# Patient Record
Sex: Female | Born: 1940
Health system: Southern US, Community
[De-identification: ages and names within clinical notes are randomized; demographics above are authoritative.]

## PROBLEM LIST (undated history)

## (undated) DIAGNOSIS — E785 Hyperlipidemia, unspecified: Secondary | ICD-10-CM

## (undated) DIAGNOSIS — R42 Dizziness and giddiness: Secondary | ICD-10-CM

## (undated) DIAGNOSIS — R55 Syncope and collapse: Secondary | ICD-10-CM

## (undated) DIAGNOSIS — R7303 Prediabetes: Secondary | ICD-10-CM

## (undated) DIAGNOSIS — H409 Unspecified glaucoma: Secondary | ICD-10-CM

## (undated) DIAGNOSIS — E7800 Pure hypercholesterolemia, unspecified: Secondary | ICD-10-CM

## (undated) DIAGNOSIS — K219 Gastro-esophageal reflux disease without esophagitis: Secondary | ICD-10-CM

## (undated) DIAGNOSIS — M5136 Other intervertebral disc degeneration, lumbar region: Secondary | ICD-10-CM

## (undated) DIAGNOSIS — I10 Essential (primary) hypertension: Secondary | ICD-10-CM

## (undated) DIAGNOSIS — M1990 Unspecified osteoarthritis, unspecified site: Secondary | ICD-10-CM

## (undated) DIAGNOSIS — M797 Fibromyalgia: Secondary | ICD-10-CM

## (undated) DIAGNOSIS — F329 Major depressive disorder, single episode, unspecified: Secondary | ICD-10-CM

## (undated) DIAGNOSIS — I714 Abdominal aortic aneurysm, without rupture, unspecified: Secondary | ICD-10-CM

## (undated) DIAGNOSIS — W19XXXA Unspecified fall, initial encounter: Secondary | ICD-10-CM

## (undated) DIAGNOSIS — F32A Depression, unspecified: Secondary | ICD-10-CM

## (undated) DIAGNOSIS — M51369 Other intervertebral disc degeneration, lumbar region without mention of lumbar back pain or lower extremity pain: Secondary | ICD-10-CM

## (undated) DIAGNOSIS — M5126 Other intervertebral disc displacement, lumbar region: Secondary | ICD-10-CM

## (undated) HISTORY — DX: Dizziness and giddiness: R42

## (undated) HISTORY — DX: Unspecified fall, initial encounter: W19.XXXA

## (undated) HISTORY — PX: VAGINAL HYSTERECTOMY: SUR661

## (undated) HISTORY — DX: Hyperlipidemia, unspecified: E78.5

## (undated) HISTORY — PX: CARDIAC CATHETERIZATION: SHX172

## (undated) SURGERY — Surgical Case
Anesthesia: *Unknown

---

## 1964-04-11 HISTORY — PX: ABDOMINAL AORTIC ANEURYSM REPAIR: SUR1152

## 1964-04-11 HISTORY — PX: APPENDECTOMY: SHX54

## 2011-05-02 DIAGNOSIS — H4011X0 Primary open-angle glaucoma, stage unspecified: Secondary | ICD-10-CM | POA: Diagnosis not present

## 2011-06-01 DIAGNOSIS — Z6829 Body mass index (BMI) 29.0-29.9, adult: Secondary | ICD-10-CM | POA: Diagnosis not present

## 2011-06-01 DIAGNOSIS — I10 Essential (primary) hypertension: Secondary | ICD-10-CM | POA: Diagnosis not present

## 2011-06-01 DIAGNOSIS — E785 Hyperlipidemia, unspecified: Secondary | ICD-10-CM | POA: Diagnosis not present

## 2011-06-01 DIAGNOSIS — M1990 Unspecified osteoarthritis, unspecified site: Secondary | ICD-10-CM | POA: Diagnosis not present

## 2011-08-17 DIAGNOSIS — M25519 Pain in unspecified shoulder: Secondary | ICD-10-CM | POA: Diagnosis not present

## 2011-08-17 DIAGNOSIS — Z6830 Body mass index (BMI) 30.0-30.9, adult: Secondary | ICD-10-CM | POA: Diagnosis not present

## 2011-08-24 DIAGNOSIS — Z6829 Body mass index (BMI) 29.0-29.9, adult: Secondary | ICD-10-CM | POA: Diagnosis not present

## 2011-08-24 DIAGNOSIS — M25519 Pain in unspecified shoulder: Secondary | ICD-10-CM | POA: Diagnosis not present

## 2011-09-19 DIAGNOSIS — Z800 Family history of malignant neoplasm of digestive organs: Secondary | ICD-10-CM | POA: Diagnosis not present

## 2011-09-19 DIAGNOSIS — Z86010 Personal history of colonic polyps: Secondary | ICD-10-CM | POA: Diagnosis not present

## 2011-10-18 DIAGNOSIS — H409 Unspecified glaucoma: Secondary | ICD-10-CM | POA: Diagnosis not present

## 2011-10-18 DIAGNOSIS — H4011X0 Primary open-angle glaucoma, stage unspecified: Secondary | ICD-10-CM | POA: Diagnosis not present

## 2012-02-02 DIAGNOSIS — M1990 Unspecified osteoarthritis, unspecified site: Secondary | ICD-10-CM | POA: Diagnosis not present

## 2012-02-02 DIAGNOSIS — Z6829 Body mass index (BMI) 29.0-29.9, adult: Secondary | ICD-10-CM | POA: Diagnosis not present

## 2012-02-02 DIAGNOSIS — I10 Essential (primary) hypertension: Secondary | ICD-10-CM | POA: Diagnosis not present

## 2012-02-02 DIAGNOSIS — E785 Hyperlipidemia, unspecified: Secondary | ICD-10-CM | POA: Diagnosis not present

## 2012-02-06 DIAGNOSIS — L578 Other skin changes due to chronic exposure to nonionizing radiation: Secondary | ICD-10-CM | POA: Diagnosis not present

## 2012-02-06 DIAGNOSIS — L821 Other seborrheic keratosis: Secondary | ICD-10-CM | POA: Diagnosis not present

## 2012-02-06 DIAGNOSIS — L570 Actinic keratosis: Secondary | ICD-10-CM | POA: Diagnosis not present

## 2012-02-13 DIAGNOSIS — Z1231 Encounter for screening mammogram for malignant neoplasm of breast: Secondary | ICD-10-CM | POA: Diagnosis not present

## 2012-04-06 DIAGNOSIS — H4011X0 Primary open-angle glaucoma, stage unspecified: Secondary | ICD-10-CM | POA: Diagnosis not present

## 2012-04-06 DIAGNOSIS — H35359 Cystoid macular degeneration, unspecified eye: Secondary | ICD-10-CM | POA: Diagnosis not present

## 2012-04-06 DIAGNOSIS — H409 Unspecified glaucoma: Secondary | ICD-10-CM | POA: Diagnosis not present

## 2012-04-06 DIAGNOSIS — H348192 Central retinal vein occlusion, unspecified eye, stable: Secondary | ICD-10-CM | POA: Diagnosis not present

## 2012-04-19 DIAGNOSIS — H4010X0 Unspecified open-angle glaucoma, stage unspecified: Secondary | ICD-10-CM | POA: Diagnosis not present

## 2012-04-19 DIAGNOSIS — H348192 Central retinal vein occlusion, unspecified eye, stable: Secondary | ICD-10-CM | POA: Diagnosis not present

## 2012-07-05 DIAGNOSIS — K219 Gastro-esophageal reflux disease without esophagitis: Secondary | ICD-10-CM | POA: Diagnosis not present

## 2012-07-05 DIAGNOSIS — M1990 Unspecified osteoarthritis, unspecified site: Secondary | ICD-10-CM | POA: Diagnosis not present

## 2012-07-05 DIAGNOSIS — I10 Essential (primary) hypertension: Secondary | ICD-10-CM | POA: Diagnosis not present

## 2012-07-05 DIAGNOSIS — E785 Hyperlipidemia, unspecified: Secondary | ICD-10-CM | POA: Diagnosis not present

## 2012-08-29 DIAGNOSIS — H348192 Central retinal vein occlusion, unspecified eye, stable: Secondary | ICD-10-CM | POA: Diagnosis not present

## 2012-08-29 DIAGNOSIS — H4011X0 Primary open-angle glaucoma, stage unspecified: Secondary | ICD-10-CM | POA: Diagnosis not present

## 2012-08-29 DIAGNOSIS — H259 Unspecified age-related cataract: Secondary | ICD-10-CM | POA: Diagnosis not present

## 2012-08-29 DIAGNOSIS — H409 Unspecified glaucoma: Secondary | ICD-10-CM | POA: Diagnosis not present

## 2012-10-30 DIAGNOSIS — H409 Unspecified glaucoma: Secondary | ICD-10-CM | POA: Diagnosis not present

## 2012-10-30 DIAGNOSIS — H4011X0 Primary open-angle glaucoma, stage unspecified: Secondary | ICD-10-CM | POA: Diagnosis not present

## 2013-01-07 DIAGNOSIS — E785 Hyperlipidemia, unspecified: Secondary | ICD-10-CM | POA: Diagnosis not present

## 2013-01-07 DIAGNOSIS — I10 Essential (primary) hypertension: Secondary | ICD-10-CM | POA: Diagnosis not present

## 2013-01-07 DIAGNOSIS — M1990 Unspecified osteoarthritis, unspecified site: Secondary | ICD-10-CM | POA: Diagnosis not present

## 2013-01-07 DIAGNOSIS — Z6829 Body mass index (BMI) 29.0-29.9, adult: Secondary | ICD-10-CM | POA: Diagnosis not present

## 2013-01-07 DIAGNOSIS — Z9181 History of falling: Secondary | ICD-10-CM | POA: Diagnosis not present

## 2013-01-07 DIAGNOSIS — Z1331 Encounter for screening for depression: Secondary | ICD-10-CM | POA: Diagnosis not present

## 2013-05-09 DIAGNOSIS — H409 Unspecified glaucoma: Secondary | ICD-10-CM | POA: Diagnosis not present

## 2013-05-09 DIAGNOSIS — H4011X0 Primary open-angle glaucoma, stage unspecified: Secondary | ICD-10-CM | POA: Diagnosis not present

## 2013-05-09 DIAGNOSIS — H47239 Glaucomatous optic atrophy, unspecified eye: Secondary | ICD-10-CM | POA: Diagnosis not present

## 2013-05-14 DIAGNOSIS — L821 Other seborrheic keratosis: Secondary | ICD-10-CM | POA: Diagnosis not present

## 2013-05-14 DIAGNOSIS — L570 Actinic keratosis: Secondary | ICD-10-CM | POA: Diagnosis not present

## 2013-05-14 DIAGNOSIS — L578 Other skin changes due to chronic exposure to nonionizing radiation: Secondary | ICD-10-CM | POA: Diagnosis not present

## 2013-06-16 DIAGNOSIS — I10 Essential (primary) hypertension: Secondary | ICD-10-CM | POA: Diagnosis not present

## 2013-06-16 DIAGNOSIS — K5289 Other specified noninfective gastroenteritis and colitis: Secondary | ICD-10-CM | POA: Diagnosis not present

## 2013-06-17 DIAGNOSIS — R5383 Other fatigue: Secondary | ICD-10-CM | POA: Diagnosis not present

## 2013-06-17 DIAGNOSIS — E860 Dehydration: Secondary | ICD-10-CM | POA: Diagnosis not present

## 2013-06-17 DIAGNOSIS — R5381 Other malaise: Secondary | ICD-10-CM | POA: Diagnosis not present

## 2013-06-17 DIAGNOSIS — R109 Unspecified abdominal pain: Secondary | ICD-10-CM | POA: Diagnosis not present

## 2013-06-17 DIAGNOSIS — R1110 Vomiting, unspecified: Secondary | ICD-10-CM | POA: Diagnosis not present

## 2013-06-17 DIAGNOSIS — R1084 Generalized abdominal pain: Secondary | ICD-10-CM | POA: Diagnosis not present

## 2013-06-17 DIAGNOSIS — R112 Nausea with vomiting, unspecified: Secondary | ICD-10-CM | POA: Diagnosis not present

## 2013-06-19 DIAGNOSIS — Z79899 Other long term (current) drug therapy: Secondary | ICD-10-CM | POA: Diagnosis not present

## 2013-06-19 DIAGNOSIS — R112 Nausea with vomiting, unspecified: Secondary | ICD-10-CM | POA: Diagnosis not present

## 2013-06-19 DIAGNOSIS — R1909 Other intra-abdominal and pelvic swelling, mass and lump: Secondary | ICD-10-CM | POA: Diagnosis not present

## 2013-06-19 DIAGNOSIS — E876 Hypokalemia: Secondary | ICD-10-CM | POA: Diagnosis not present

## 2013-06-19 DIAGNOSIS — K929 Disease of digestive system, unspecified: Secondary | ICD-10-CM | POA: Diagnosis not present

## 2013-06-19 DIAGNOSIS — R109 Unspecified abdominal pain: Secondary | ICD-10-CM | POA: Diagnosis not present

## 2013-06-19 DIAGNOSIS — F411 Generalized anxiety disorder: Secondary | ICD-10-CM | POA: Diagnosis not present

## 2013-06-19 DIAGNOSIS — J984 Other disorders of lung: Secondary | ICD-10-CM | POA: Diagnosis not present

## 2013-06-19 DIAGNOSIS — E7800 Pure hypercholesterolemia, unspecified: Secondary | ICD-10-CM | POA: Diagnosis present

## 2013-06-19 DIAGNOSIS — K828 Other specified diseases of gallbladder: Secondary | ICD-10-CM | POA: Diagnosis not present

## 2013-06-19 DIAGNOSIS — R1084 Generalized abdominal pain: Secondary | ICD-10-CM | POA: Diagnosis not present

## 2013-06-19 DIAGNOSIS — K560 Paralytic ileus: Secondary | ICD-10-CM | POA: Diagnosis not present

## 2013-06-19 DIAGNOSIS — D72825 Bandemia: Secondary | ICD-10-CM | POA: Diagnosis not present

## 2013-06-19 DIAGNOSIS — I10 Essential (primary) hypertension: Secondary | ICD-10-CM | POA: Diagnosis not present

## 2013-06-19 DIAGNOSIS — D484 Neoplasm of uncertain behavior of peritoneum: Secondary | ICD-10-CM | POA: Diagnosis not present

## 2013-06-19 DIAGNOSIS — Q438 Other specified congenital malformations of intestine: Secondary | ICD-10-CM | POA: Diagnosis not present

## 2013-06-19 DIAGNOSIS — D483 Neoplasm of uncertain behavior of retroperitoneum: Secondary | ICD-10-CM | POA: Diagnosis not present

## 2013-06-19 DIAGNOSIS — K654 Sclerosing mesenteritis: Secondary | ICD-10-CM | POA: Diagnosis not present

## 2013-06-19 DIAGNOSIS — M1990 Unspecified osteoarthritis, unspecified site: Secondary | ICD-10-CM | POA: Diagnosis present

## 2013-06-19 DIAGNOSIS — K5289 Other specified noninfective gastroenteritis and colitis: Secondary | ICD-10-CM | POA: Diagnosis not present

## 2013-07-02 DIAGNOSIS — R109 Unspecified abdominal pain: Secondary | ICD-10-CM | POA: Diagnosis not present

## 2013-07-02 DIAGNOSIS — K5289 Other specified noninfective gastroenteritis and colitis: Secondary | ICD-10-CM | POA: Diagnosis not present

## 2013-07-02 DIAGNOSIS — Z6828 Body mass index (BMI) 28.0-28.9, adult: Secondary | ICD-10-CM | POA: Diagnosis not present

## 2013-07-15 DIAGNOSIS — H4011X0 Primary open-angle glaucoma, stage unspecified: Secondary | ICD-10-CM | POA: Diagnosis not present

## 2013-07-15 DIAGNOSIS — H409 Unspecified glaucoma: Secondary | ICD-10-CM | POA: Diagnosis not present

## 2013-07-16 DIAGNOSIS — Z6826 Body mass index (BMI) 26.0-26.9, adult: Secondary | ICD-10-CM | POA: Diagnosis not present

## 2013-07-16 DIAGNOSIS — T8140XA Infection following a procedure, unspecified, initial encounter: Secondary | ICD-10-CM | POA: Diagnosis not present

## 2013-08-26 ENCOUNTER — Emergency Department (HOSPITAL_COMMUNITY)
Admission: EM | Admit: 2013-08-26 | Discharge: 2013-08-26 | Disposition: A | Discharge status: Home / Self Care | Payer: Medicare Other | Attending: Emergency Medicine | Admitting: Emergency Medicine

## 2013-08-26 ENCOUNTER — Encounter (HOSPITAL_COMMUNITY): Payer: Self-pay | Admitting: Emergency Medicine

## 2013-08-26 ENCOUNTER — Emergency Department (HOSPITAL_COMMUNITY): Payer: Medicare Other

## 2013-08-26 DIAGNOSIS — R109 Unspecified abdominal pain: Secondary | ICD-10-CM | POA: Diagnosis not present

## 2013-08-26 DIAGNOSIS — I10 Essential (primary) hypertension: Secondary | ICD-10-CM | POA: Insufficient documentation

## 2013-08-26 DIAGNOSIS — H409 Unspecified glaucoma: Secondary | ICD-10-CM | POA: Diagnosis not present

## 2013-08-26 DIAGNOSIS — E119 Type 2 diabetes mellitus without complications: Secondary | ICD-10-CM | POA: Insufficient documentation

## 2013-08-26 DIAGNOSIS — Z4801 Encounter for change or removal of surgical wound dressing: Secondary | ICD-10-CM | POA: Insufficient documentation

## 2013-08-26 DIAGNOSIS — Z4889 Encounter for other specified surgical aftercare: Secondary | ICD-10-CM

## 2013-08-26 DIAGNOSIS — Z4802 Encounter for removal of sutures: Secondary | ICD-10-CM | POA: Diagnosis not present

## 2013-08-26 HISTORY — DX: Essential (primary) hypertension: I10

## 2013-08-26 HISTORY — DX: Abdominal aortic aneurysm, without rupture, unspecified: I71.40

## 2013-08-26 HISTORY — DX: Abdominal aortic aneurysm, without rupture: I71.4

## 2013-08-26 LAB — CBC WITH DIFFERENTIAL/PLATELET
BASOS ABS: 0 10*3/uL (ref 0.0–0.1)
Basophils Relative: 1 % (ref 0–1)
EOS ABS: 0.1 10*3/uL (ref 0.0–0.7)
Eosinophils Relative: 2 % (ref 0–5)
HCT: 41.5 % (ref 36.0–46.0)
Hemoglobin: 13.9 g/dL (ref 12.0–15.0)
Lymphocytes Relative: 38 % (ref 12–46)
Lymphs Abs: 1.2 10*3/uL (ref 0.7–4.0)
MCH: 33.7 pg (ref 26.0–34.0)
MCHC: 33.5 g/dL (ref 30.0–36.0)
MCV: 100.5 fL — ABNORMAL HIGH (ref 78.0–100.0)
Monocytes Absolute: 0.4 10*3/uL (ref 0.1–1.0)
Monocytes Relative: 13 % — ABNORMAL HIGH (ref 3–12)
NEUTROS PCT: 46 % (ref 43–77)
Neutro Abs: 1.5 10*3/uL — ABNORMAL LOW (ref 1.7–7.7)
PLATELETS: 179 10*3/uL (ref 150–400)
RBC: 4.13 MIL/uL (ref 3.87–5.11)
RDW: 13.9 % (ref 11.5–15.5)
WBC: 3.1 10*3/uL — ABNORMAL LOW (ref 4.0–10.5)

## 2013-08-26 LAB — BASIC METABOLIC PANEL
BUN: 20 mg/dL (ref 6–23)
CO2: 24 mEq/L (ref 19–32)
Calcium: 8.8 mg/dL (ref 8.4–10.5)
Chloride: 107 mEq/L (ref 96–112)
Creatinine, Ser: 0.98 mg/dL (ref 0.50–1.10)
GFR, EST AFRICAN AMERICAN: 65 mL/min — AB (ref >90)
GFR, EST NON AFRICAN AMERICAN: 56 mL/min — AB (ref >90)
Glucose, Bld: 90 mg/dL (ref 70–99)
Potassium: 3.9 mEq/L (ref 3.7–5.3)
SODIUM: 143 meq/L (ref 137–147)

## 2013-08-26 MED ORDER — SODIUM CHLORIDE 0.9 % IV BOLUS (SEPSIS)
1000.0000 mL | Freq: Once | INTRAVENOUS | Status: AC
  Administered 2013-08-26: 1000 mL via INTRAVENOUS

## 2013-08-26 MED ORDER — IOHEXOL 300 MG/ML  SOLN
100.0000 mL | Freq: Once | INTRAMUSCULAR | Status: AC | PRN
  Administered 2013-08-26: 100 mL via INTRAVENOUS

## 2013-08-26 MED ORDER — IOHEXOL 300 MG/ML  SOLN
25.0000 mL | INTRAMUSCULAR | Status: AC
  Administered 2013-08-26: 25 mL via ORAL

## 2013-08-26 NOTE — ED Notes (Signed)
Pt finished drinking oral CT contrast. CT has been notified.

## 2013-08-26 NOTE — ED Notes (Addendum)
Family reports that pt had exploratory surgery in March and has had complications since. Family reports that the incision keeps draining, Pt has been seen by surgeon but not satisfied. Surgery was done at [facility].

## 2013-08-26 NOTE — Discharge Instructions (Signed)
Your incision looks like it is healing well. It may continue slowly draining clearish yellow fluid. Your CT abdomen showed no sign of wound infection. If you develop fevers, pus discharge from your wound, redness, wound opening, or other concerns, seek immediate care. Otherwise, follow up regularly with your surgeon.  Your CT did show a 7 mm RIGHT middle lobe nodule, and radiologist recommendation follow up chest CT in 3-6 months if you are at high risk for bronchogenic carcinoma and 6-12 months if you are at low risk. Discuss this with your primary care doctor.

## 2013-08-26 NOTE — ED Provider Notes (Signed)
CSN: [id number]     Arrival date & time 08/26/13  0272 History   First MD Initiated Contact with Patient 08/26/13 [phone]     Chief Complaint  Patient presents with  . Post-op Problem  . Wound Check   Patient is a 73 y.o. female presenting with wound check.  Wound Check   73 y.o. female with concern for wound problem. She reports 2 months ago having exploratory laparotomy at [facility] and feeling very concerned that the wound did not have adequate closure. Wound was stapled shut and husband shows me photos of area of nonclosure. Patient states this area was draining and has been tender for the past 2 months. She has had subjective fevers/chills and abdominal pain since surgery, and they are very concerned about remaining staple. She has been able to eat and drink and is passing stools normally.  Past Medical History  Diagnosis Date  . Hypertension   . Diabetes mellitus without complication boderline  . Glaucoma   . AAA (abdominal aortic aneurysm)    Past Surgical History  Procedure Laterality Date  . Abdominal surgery    . Abdominal hysterectomy     No family history on file. History  Substance Use Topics  . Smoking status: Never Smoker   . Smokeless tobacco: Not on file  . Alcohol Use: No   OB History   Grav Para Term Preterm Abortions TAB SAB Ect Mult Living                 Review of Systems  All other systems reviewed and are negative.   Allergies  Review of patient's allergies indicates no known allergies.  Home Medications   Prior to Admission medications   Not on File   BP 126/62  Pulse 69  Temp(Src) 98.1 F (36.7 C) (Oral)  Resp 18  Ht 5' 1.5" (1.562 m)  Wt 148 lb (67.132 kg)  BMI 27.51 kg/m2  SpO2 96% Physical Exam GEN: NAD CV: RRR, 2+ bilateral radial pulses PULM: CTAB, normal effort ABD: Vertical 4cm incision that is healed with central thickening/granulation tissue at 3cm of wound, no surrounding induration or erythema, dressing with  minimal dry serous drainage, mild tenderness lower abdomen and suprapubic, no rebound or guarding EXTR: No LE edema or calf tenderness HEENT: AT/[place], EOMI, sclera clear  ED Course  Procedures (including critical care time) Labs Review Labs Reviewed  CBC WITH DIFFERENTIAL - Abnormal; Notable for the following:    WBC 3.1 (*)    MCV 100.5 (*)    Neutro Abs 1.5 (*)    Monocytes Relative 13 (*)    All other components within normal limits  BASIC METABOLIC PANEL - Abnormal; Notable for the following:    GFR calc non Af Amer 56 (*)    GFR calc Af Amer 65 (*)    All other components within normal limits   Imaging Review Ct Abdomen Pelvis W Contrast  08/26/2013   CLINICAL DATA:  Abdominal pain at incision site from exploratory laparotomy performed approximately 2 months ago, history hypertension, diabetes, hysterectomy  EXAM: CT ABDOMEN AND PELVIS WITH CONTRAST  TECHNIQUE: Multidetector CT imaging of the abdomen and pelvis was performed using the standard protocol following bolus administration of intravenous contrast. Sagittal and coronal MPR images reconstructed from axial data set.  CONTRAST:  142mL OMNIPAQUE IOHEXOL 300 MG/ML SOLN IV. Dilute oral contrast.  COMPARISON:  06/19/2013  FINDINGS: Dependent atelectasis at lung bases.  RIGHT middle lobe nodular density 7 mm diameter  image 5, not previously imaged.  Tiny splenule anterior to spleen.  Liver, spleen, pancreas, kidneys, and adrenal glands normal.  Stomach decompressed.  Appendix not visualized.  Large and small bowel loops unremarkable.  No mass, adenopathy, free fluid or inflammatory process.  Unremarkable bladder and ureters.  Infiltrative changes at anterior abdominal wall post ventral laparotomy new since previous exam.  Multiple areas of stranding are seen within subcutaneous fat at the incision as well as intermixed within fascial planes at the midline.  No discrete definitive fashion defect is seen to suggest ventral hernia.  No focal  abdominal wall fluid collections or bowel herniation identified.  Minimal scattered atherosclerotic calcification.  Degenerative disc disease changes at L4-L5 and L5-S1.  IMPRESSION: Postsurgical changes at the anterior abdominal wall without definite ventral hernia or postoperative fluid collection.  No acute intra-abdominal or intrapelvic abnormalities.  7 mm RIGHT middle lobe nodule, recommendation below.  If the patient is at high risk for bronchogenic carcinoma, follow-up chest CT at 3-74months is recommended. If the patient is at low risk for bronchogenic carcinoma, follow-up chest CT at 6-12 months is recommended. This recommendation follows the consensus statement: Guidelines for Management of Small Pulmonary Nodules Detected on CT Scans: A Statement from the [place] as published in Radiology 2005; 237:395-400.   Electronically Signed   By: Lavonia Dana M.D.   On: 08/26/2013 15:04     EKG Interpretation None      MDM   Final diagnoses:  Encounter for postoperative wound check   73 y.o. female with exploratory laparotomy 2 mo ago at outside hospital, reporting concern about poor healing / staple left inside with continued pain/tenderness, subjective fevers/chills, and wound drainage that has been decreasing but is still present. CT abdomen with no signs of infection/abscess or fluid collection. Patient is afebrile with normal vitals. No leukocytosis. - Reassurance provided. Wound appears to be healing well. - Return precautions reviewed.  - F/u with surgeon early June as scheduled. - Incidental CT scan (67mm right middle lobe nodule) discussed with patient and recommended f/u as suggested by radiologist.  Hilton Sinclair, MD PGY-2, [facility]   Hilton Sinclair, MD 08/26/13 [phone]

## 2013-08-27 NOTE — ED Provider Notes (Signed)
I saw and evaluated the patient, reviewed the resident's note and I agree with the findings and plan. Patient is a 73 year old female who presents with complaints of abdominal pain and wound drainage. She was recently admitted to [facility] for some sort of "bowel inflammation". This required exploratory laparotomy which was performed and was nondiagnostic. She states since that time she has had wound drainage and continued abdominal discomfort. She denies any fevers or chills. She denies any bloody stool or vomiting.  On exam, vitals are stable and the patient is afebrile. Head is atraumatic, normocephalic. Neck is supple. Heart regular rate and rhythm and lungs are clear. Abdominal exam reveals a midline incision to be healing well without evidence for drainage, erythema, or warmth. There is no palpable fluctuance beneath the wound. There is mild tenderness to palpation in the left lower and right lower quadrant with no rebound or guarding. Extremities are without edema.  Patient presents with concerns of possible complication related to surgery performed at an outside facility. Her laboratory studies are unremarkable. CT scan of the abdomen and pelvis was obtained which revealed post surgical changes in the anterior abdominal wall, however no acute process. The patient was reassured and appears appropriate for discharge. We have advised they followup with their surgeon to discuss further concerns.      Veryl Speak, MD 08/27/13 [phone]

## 2013-10-18 DIAGNOSIS — I10 Essential (primary) hypertension: Secondary | ICD-10-CM | POA: Diagnosis not present

## 2013-10-18 DIAGNOSIS — Z0000 Encounter for general adult medical examination without abnormal findings: Secondary | ICD-10-CM | POA: Diagnosis not present

## 2013-10-18 DIAGNOSIS — E785 Hyperlipidemia, unspecified: Secondary | ICD-10-CM | POA: Diagnosis not present

## 2013-10-18 DIAGNOSIS — Z6828 Body mass index (BMI) 28.0-28.9, adult: Secondary | ICD-10-CM | POA: Diagnosis not present

## 2013-10-18 DIAGNOSIS — Z23 Encounter for immunization: Secondary | ICD-10-CM | POA: Diagnosis not present

## 2013-10-18 DIAGNOSIS — M949 Disorder of cartilage, unspecified: Secondary | ICD-10-CM | POA: Diagnosis not present

## 2013-10-18 DIAGNOSIS — M899 Disorder of bone, unspecified: Secondary | ICD-10-CM | POA: Diagnosis not present

## 2013-11-15 DIAGNOSIS — Z1231 Encounter for screening mammogram for malignant neoplasm of breast: Secondary | ICD-10-CM | POA: Diagnosis not present

## 2013-11-15 DIAGNOSIS — M899 Disorder of bone, unspecified: Secondary | ICD-10-CM | POA: Diagnosis not present

## 2013-11-15 DIAGNOSIS — M949 Disorder of cartilage, unspecified: Secondary | ICD-10-CM | POA: Diagnosis not present

## 2014-01-01 DIAGNOSIS — H409 Unspecified glaucoma: Secondary | ICD-10-CM | POA: Diagnosis not present

## 2014-01-01 DIAGNOSIS — H269 Unspecified cataract: Secondary | ICD-10-CM | POA: Insufficient documentation

## 2014-01-01 DIAGNOSIS — H2510 Age-related nuclear cataract, unspecified eye: Secondary | ICD-10-CM | POA: Diagnosis not present

## 2014-01-01 DIAGNOSIS — IMO0002 Reserved for concepts with insufficient information to code with codable children: Secondary | ICD-10-CM

## 2014-01-01 DIAGNOSIS — H4011X0 Primary open-angle glaucoma, stage unspecified: Secondary | ICD-10-CM | POA: Diagnosis not present

## 2014-01-01 HISTORY — DX: Reserved for concepts with insufficient information to code with codable children: IMO0002

## 2014-01-20 DIAGNOSIS — Z6829 Body mass index (BMI) 29.0-29.9, adult: Secondary | ICD-10-CM | POA: Diagnosis not present

## 2014-01-20 DIAGNOSIS — I10 Essential (primary) hypertension: Secondary | ICD-10-CM | POA: Diagnosis not present

## 2014-01-20 DIAGNOSIS — M1990 Unspecified osteoarthritis, unspecified site: Secondary | ICD-10-CM | POA: Diagnosis not present

## 2014-01-20 DIAGNOSIS — E785 Hyperlipidemia, unspecified: Secondary | ICD-10-CM | POA: Diagnosis not present

## 2014-01-20 DIAGNOSIS — M8580 Other specified disorders of bone density and structure, unspecified site: Secondary | ICD-10-CM | POA: Diagnosis not present

## 2014-01-20 DIAGNOSIS — K219 Gastro-esophageal reflux disease without esophagitis: Secondary | ICD-10-CM | POA: Diagnosis not present

## 2014-01-20 DIAGNOSIS — F329 Major depressive disorder, single episode, unspecified: Secondary | ICD-10-CM | POA: Diagnosis not present

## 2014-04-24 DIAGNOSIS — F329 Major depressive disorder, single episode, unspecified: Secondary | ICD-10-CM | POA: Diagnosis not present

## 2014-04-24 DIAGNOSIS — Z6830 Body mass index (BMI) 30.0-30.9, adult: Secondary | ICD-10-CM | POA: Diagnosis not present

## 2014-04-24 DIAGNOSIS — K219 Gastro-esophageal reflux disease without esophagitis: Secondary | ICD-10-CM | POA: Diagnosis not present

## 2014-04-24 DIAGNOSIS — E785 Hyperlipidemia, unspecified: Secondary | ICD-10-CM | POA: Diagnosis not present

## 2014-04-24 DIAGNOSIS — I10 Essential (primary) hypertension: Secondary | ICD-10-CM | POA: Diagnosis not present

## 2014-04-24 DIAGNOSIS — M1990 Unspecified osteoarthritis, unspecified site: Secondary | ICD-10-CM | POA: Diagnosis not present

## 2014-04-24 DIAGNOSIS — M8580 Other specified disorders of bone density and structure, unspecified site: Secondary | ICD-10-CM | POA: Diagnosis not present

## 2014-05-08 DIAGNOSIS — H4011X2 Primary open-angle glaucoma, moderate stage: Secondary | ICD-10-CM | POA: Diagnosis not present

## 2014-05-14 DIAGNOSIS — L739 Follicular disorder, unspecified: Secondary | ICD-10-CM | POA: Diagnosis not present

## 2014-05-14 DIAGNOSIS — L570 Actinic keratosis: Secondary | ICD-10-CM | POA: Diagnosis not present

## 2014-05-14 DIAGNOSIS — L821 Other seborrheic keratosis: Secondary | ICD-10-CM | POA: Diagnosis not present

## 2014-06-16 DIAGNOSIS — H2513 Age-related nuclear cataract, bilateral: Secondary | ICD-10-CM | POA: Diagnosis not present

## 2014-06-16 DIAGNOSIS — H4011X2 Primary open-angle glaucoma, moderate stage: Secondary | ICD-10-CM | POA: Diagnosis not present

## 2014-06-16 DIAGNOSIS — H4011X1 Primary open-angle glaucoma, mild stage: Secondary | ICD-10-CM | POA: Diagnosis not present

## 2014-11-04 DIAGNOSIS — R05 Cough: Secondary | ICD-10-CM | POA: Diagnosis not present

## 2014-11-04 DIAGNOSIS — M1990 Unspecified osteoarthritis, unspecified site: Secondary | ICD-10-CM | POA: Diagnosis not present

## 2014-11-04 DIAGNOSIS — Z9181 History of falling: Secondary | ICD-10-CM | POA: Diagnosis not present

## 2014-11-04 DIAGNOSIS — K219 Gastro-esophageal reflux disease without esophagitis: Secondary | ICD-10-CM | POA: Diagnosis not present

## 2014-11-04 DIAGNOSIS — I10 Essential (primary) hypertension: Secondary | ICD-10-CM | POA: Diagnosis not present

## 2014-11-04 DIAGNOSIS — E785 Hyperlipidemia, unspecified: Secondary | ICD-10-CM | POA: Diagnosis not present

## 2014-11-04 DIAGNOSIS — Z6831 Body mass index (BMI) 31.0-31.9, adult: Secondary | ICD-10-CM | POA: Diagnosis not present

## 2015-01-12 DIAGNOSIS — H401112 Primary open-angle glaucoma, right eye, moderate stage: Secondary | ICD-10-CM | POA: Diagnosis not present

## 2015-01-12 DIAGNOSIS — H401121 Primary open-angle glaucoma, left eye, mild stage: Secondary | ICD-10-CM | POA: Diagnosis not present

## 2015-01-13 DIAGNOSIS — I10 Essential (primary) hypertension: Secondary | ICD-10-CM | POA: Diagnosis not present

## 2015-01-13 DIAGNOSIS — H409 Unspecified glaucoma: Secondary | ICD-10-CM | POA: Diagnosis not present

## 2015-01-13 DIAGNOSIS — Z1231 Encounter for screening mammogram for malignant neoplasm of breast: Secondary | ICD-10-CM | POA: Diagnosis not present

## 2015-01-13 DIAGNOSIS — Z0000 Encounter for general adult medical examination without abnormal findings: Secondary | ICD-10-CM | POA: Diagnosis not present

## 2015-01-13 DIAGNOSIS — E785 Hyperlipidemia, unspecified: Secondary | ICD-10-CM | POA: Diagnosis not present

## 2015-01-13 DIAGNOSIS — Z6831 Body mass index (BMI) 31.0-31.9, adult: Secondary | ICD-10-CM | POA: Diagnosis not present

## 2015-01-13 DIAGNOSIS — F329 Major depressive disorder, single episode, unspecified: Secondary | ICD-10-CM | POA: Diagnosis not present

## 2015-01-19 DIAGNOSIS — Z1231 Encounter for screening mammogram for malignant neoplasm of breast: Secondary | ICD-10-CM | POA: Diagnosis not present

## 2015-02-03 DIAGNOSIS — B354 Tinea corporis: Secondary | ICD-10-CM | POA: Diagnosis not present

## 2015-02-03 DIAGNOSIS — L720 Epidermal cyst: Secondary | ICD-10-CM | POA: Diagnosis not present

## 2015-02-03 DIAGNOSIS — L570 Actinic keratosis: Secondary | ICD-10-CM | POA: Diagnosis not present

## 2015-02-11 DIAGNOSIS — R05 Cough: Secondary | ICD-10-CM | POA: Diagnosis not present

## 2015-02-11 DIAGNOSIS — J019 Acute sinusitis, unspecified: Secondary | ICD-10-CM | POA: Diagnosis not present

## 2015-02-13 DIAGNOSIS — R05 Cough: Secondary | ICD-10-CM | POA: Diagnosis not present

## 2015-03-04 DIAGNOSIS — J019 Acute sinusitis, unspecified: Secondary | ICD-10-CM | POA: Diagnosis not present

## 2015-03-04 DIAGNOSIS — R05 Cough: Secondary | ICD-10-CM | POA: Diagnosis not present

## 2015-03-04 DIAGNOSIS — J189 Pneumonia, unspecified organism: Secondary | ICD-10-CM | POA: Diagnosis not present

## 2015-03-04 DIAGNOSIS — Z6831 Body mass index (BMI) 31.0-31.9, adult: Secondary | ICD-10-CM | POA: Diagnosis not present

## 2015-03-04 DIAGNOSIS — R0602 Shortness of breath: Secondary | ICD-10-CM | POA: Diagnosis not present

## 2015-03-09 DIAGNOSIS — L570 Actinic keratosis: Secondary | ICD-10-CM | POA: Diagnosis not present

## 2015-03-09 DIAGNOSIS — B351 Tinea unguium: Secondary | ICD-10-CM | POA: Diagnosis not present

## 2015-03-09 DIAGNOSIS — B354 Tinea corporis: Secondary | ICD-10-CM | POA: Diagnosis not present

## 2015-03-12 DIAGNOSIS — R918 Other nonspecific abnormal finding of lung field: Secondary | ICD-10-CM | POA: Diagnosis not present

## 2015-03-12 DIAGNOSIS — R938 Abnormal findings on diagnostic imaging of other specified body structures: Secondary | ICD-10-CM | POA: Diagnosis not present

## 2015-03-13 DIAGNOSIS — R05 Cough: Secondary | ICD-10-CM | POA: Diagnosis not present

## 2015-03-13 DIAGNOSIS — Z8701 Personal history of pneumonia (recurrent): Secondary | ICD-10-CM | POA: Diagnosis not present

## 2015-03-13 DIAGNOSIS — K219 Gastro-esophageal reflux disease without esophagitis: Secondary | ICD-10-CM | POA: Diagnosis not present

## 2015-03-17 ENCOUNTER — Ambulatory Visit (INDEPENDENT_AMBULATORY_CARE_PROVIDER_SITE_OTHER): Payer: Medicare Other | Admitting: Pulmonary Disease

## 2015-03-17 ENCOUNTER — Encounter: Payer: Self-pay | Admitting: Pulmonary Disease

## 2015-03-17 VITALS — BP 138/64 | HR 74 | Ht 62.0 in | Wt 168.8 lb

## 2015-03-17 DIAGNOSIS — R911 Solitary pulmonary nodule: Secondary | ICD-10-CM

## 2015-03-17 DIAGNOSIS — R042 Hemoptysis: Secondary | ICD-10-CM | POA: Diagnosis not present

## 2015-03-17 NOTE — Progress Notes (Signed)
Subjective:    Patient ID: Cassandra Brooks, female    DOB: [birth year], 74 y.o.   MRN: [id number]  HPI  Chief Complaint  Patient presents with  . Pulmonary Consult    Referred by Dr. Manson Allan; coughing up bloody sputum.  hx of pneumonia, will not go away with antibiotics.  Lung nodules on CT from 2015.  Abnormal CT Chest on 03/12/15.     74 year old never smoker presents for evaluation of hemoptysis. She is accompanied by her husband who provides most of the history  She reports that for the past year she has been bringing up blood specked scabs With her cough .this happens at least once a week .she denies associated respiratory distress , wheezing , orthopnea or paroxysmal nocturnal dyspnea .she has seen her PCP and was given antibiotics 2 courses for pneumonia  Chest x-ray on 11/4 in 11/23 suggested a left lower lobe infiltrate however CT chest did not show any convincing evidence of pneumonia or pulmonary edema .  She had an ENT evaluation which was negative for bleeding source in her upper airway . She denies bleeding gums or dental issues . She reports that she always has a dry nose .  About 12 years ago , she developed cough and respiratory distress . She is to Chiropractor and was told that this may have been related to exposure to birds .   Significant tests/ events  CT chest 03/12/15 40mm nodule in RLL, no convincing pna or edema    Past Medical History  Diagnosis Date  . Hypertension   . Diabetes mellitus without complication ([place]) boderline  . Glaucoma   . AAA (abdominal aortic aneurysm) [facility])     Past Surgical History  Procedure Laterality Date  . Abdominal surgery    . Abdominal hysterectomy      No Known Allergies  Social History   Social History  . Marital Status: Married    Spouse Name: N/A  . Number of Children: N/A  . Years of Education: N/A   Occupational History  . Not on file.   Social History Main Topics  . Smoking status: Never Smoker   .  Smokeless tobacco: Not on file  . Alcohol Use: No  . Drug Use: Not on file  . Sexual Activity: Not on file   Other Topics Concern  . Not on file   Social History Narrative    No family history on file. No Family history of coronary artery disease  Review of Systems  Constitutional: Negative for fever, chills and unexpected weight change.  HENT: Negative for congestion, dental problem, ear pain, nosebleeds, postnasal drip, rhinorrhea, sinus pressure, sneezing, sore throat, trouble swallowing and voice change.   Eyes: Negative for visual disturbance.  Respiratory: Positive for cough and shortness of breath. Negative for choking.   Cardiovascular: Negative for chest pain and leg swelling.  Gastrointestinal: Negative for vomiting, abdominal pain and diarrhea.  Genitourinary: Negative for difficulty urinating.  Musculoskeletal: Negative for arthralgias.  Skin: Negative for rash.  Neurological: Negative for tremors, syncope and headaches.  Hematological: Does not bruise/bleed easily.       Objective:   Physical Exam  Gen. Pleasant, well-nourished, in no distress, normal affect ENT - no lesions, no post nasal drip Neck: No JVD, no thyromegaly, no carotid bruits Lungs: no use of accessory muscles, no dullness to percussion, clear without rales or rhonchi  Cardiovascular: Rhythm regular, heart sounds  normal, no murmurs or gallops, no peripheral edema Abdomen: soft  and non-tender, no hepatosplenomegaly, BS normal. Musculoskeletal: No deformities, no cyanosis or clubbing Neuro:  alert, non focal       Assessment & Plan:

## 2015-03-17 NOTE — Patient Instructions (Addendum)
Bronchoscopy arranged for Thursday 8am at [facility] NPO after MN the night before You will need follow up scan for lung nodule

## 2015-03-18 DIAGNOSIS — R042 Hemoptysis: Secondary | ICD-10-CM | POA: Insufficient documentation

## 2015-03-18 DIAGNOSIS — R911 Solitary pulmonary nodule: Secondary | ICD-10-CM | POA: Insufficient documentation

## 2015-03-18 HISTORY — DX: Hemoptysis: R04.2

## 2015-03-18 HISTORY — DX: Solitary pulmonary nodule: R91.1

## 2015-03-18 NOTE — Assessment & Plan Note (Signed)
Low risk for malignancy Follow-up scan in 12 months

## 2015-03-18 NOTE — Assessment & Plan Note (Signed)
No obvious sources apparent on CT chest. She has had an ENT evaluation Since this has been ongoing for a long time, we will proceed with inspection bronchoscopy to clarify Risks of the procedure was discussed in detail

## 2015-03-19 ENCOUNTER — Encounter (HOSPITAL_COMMUNITY)
Admission: RE | Disposition: A | Discharge status: Home / Self Care | Payer: Medicare Other | Source: Ambulatory Visit | Attending: Pulmonary Disease

## 2015-03-19 ENCOUNTER — Ambulatory Visit (HOSPITAL_COMMUNITY)
Admission: RE | Admit: 2015-03-19 | Discharge: 2015-03-19 | Disposition: A | Discharge status: Home / Self Care | Payer: Medicare Other | Source: Ambulatory Visit | Attending: Pulmonary Disease | Admitting: Pulmonary Disease

## 2015-03-19 ENCOUNTER — Encounter (HOSPITAL_COMMUNITY): Payer: Self-pay | Admitting: Respiratory Therapy

## 2015-03-19 DIAGNOSIS — R918 Other nonspecific abnormal finding of lung field: Secondary | ICD-10-CM | POA: Insufficient documentation

## 2015-03-19 DIAGNOSIS — Z8701 Personal history of pneumonia (recurrent): Secondary | ICD-10-CM | POA: Insufficient documentation

## 2015-03-19 DIAGNOSIS — E119 Type 2 diabetes mellitus without complications: Secondary | ICD-10-CM | POA: Diagnosis not present

## 2015-03-19 DIAGNOSIS — R042 Hemoptysis: Secondary | ICD-10-CM

## 2015-03-19 DIAGNOSIS — H409 Unspecified glaucoma: Secondary | ICD-10-CM | POA: Diagnosis not present

## 2015-03-19 DIAGNOSIS — I10 Essential (primary) hypertension: Secondary | ICD-10-CM | POA: Diagnosis not present

## 2015-03-19 HISTORY — PX: VIDEO BRONCHOSCOPY: SHX5072

## 2015-03-19 SURGERY — VIDEO BRONCHOSCOPY WITHOUT FLUORO
Anesthesia: Moderate Sedation | Laterality: Bilateral

## 2015-03-19 MED ORDER — MIDAZOLAM HCL 5 MG/ML IJ SOLN
INTRAMUSCULAR | Status: AC
  Filled 2015-03-19: qty 2

## 2015-03-19 MED ORDER — FENTANYL CITRATE (PF) 100 MCG/2ML IJ SOLN
INTRAMUSCULAR | Status: AC
  Filled 2015-03-19: qty 4

## 2015-03-19 MED ORDER — MIDAZOLAM HCL 10 MG/2ML IJ SOLN
INTRAMUSCULAR | Status: DC | PRN
  Administered 2015-03-19 (×2): 1 mg via INTRAVENOUS

## 2015-03-19 MED ORDER — SODIUM CHLORIDE 0.9 % IV SOLN
INTRAVENOUS | Status: DC
  Administered 2015-03-19: 08:00:00 via INTRAVENOUS

## 2015-03-19 MED ORDER — BUTAMBEN-TETRACAINE-BENZOCAINE 2-2-14 % EX AERO: 1.0000 | INHALATION_SPRAY | Freq: Once | CUTANEOUS | Status: DC

## 2015-03-19 MED ORDER — PHENYLEPHRINE HCL 0.25 % NA SOLN
NASAL | Status: DC | PRN
  Administered 2015-03-19: 2 via NASAL

## 2015-03-19 MED ORDER — LIDOCAINE HCL 1 % IJ SOLN
INTRAMUSCULAR | Status: DC | PRN
  Administered 2015-03-19: 6 mL via RESPIRATORY_TRACT

## 2015-03-19 MED ORDER — LIDOCAINE HCL 2 % EX GEL: 1.0000 "application " | Freq: Once | CUTANEOUS | Status: DC

## 2015-03-19 MED ORDER — LIDOCAINE HCL 2 % EX GEL
CUTANEOUS | Status: DC | PRN
Start: 2015-03-19 — End: 2015-03-19
  Administered 2015-03-19: 1

## 2015-03-19 MED ORDER — PHENYLEPHRINE HCL 0.25 % NA SOLN: 1.0000 | Freq: Four times a day (QID) | NASAL | Status: DC | PRN

## 2015-03-19 MED ORDER — FENTANYL CITRATE (PF) 100 MCG/2ML IJ SOLN
INTRAMUSCULAR | Status: DC | PRN
  Administered 2015-03-19 (×2): 25 ug via INTRAVENOUS

## 2015-03-19 NOTE — Interval H&P Note (Signed)
History and Physical Interval Note:  03/19/2015 8:36 AM  Cassandra Brooks  has presented today for surgery, with the diagnosis of Hemoptysis  The various methods of treatment have been discussed with the patient and family. After consideration of risks, benefits and other options for treatment, the patient has consented to  Procedure(s): VIDEO BRONCHOSCOPY WITHOUT FLUORO (Bilateral) as a surgical intervention .  The patient's history has been reviewed, patient examined, no change in status, stable for surgery.  I have reviewed the patient's chart and labs.  Questions were answered to the patient's satisfaction.     Sho Salguero V.

## 2015-03-19 NOTE — Discharge Instructions (Signed)
Flexible Bronchoscopy, Care After These instructions give you information on caring for yourself after your procedure. Your doctor may also give you more specific instructions. Call your doctor if you have any problems or questions after your procedure. HOME CARE  Do not eat or drink anything for 2 hours after your procedure. If you try to eat or drink before the medicine wears off, food or drink could go into your lungs. You could also burn yourself.  After 2 hours have passed and when you can cough and gag normally, you may eat soft food and drink liquids slowly.  The day after the test, you may eat your normal diet.  You may do your normal activities.  Keep all doctor visits. GET HELP RIGHT AWAY IF:  You get more and more short of breath.  You get light-headed.  You feel like you are going to pass out (faint).  You have chest pain.  You have new problems that worry you.  You cough up more than a little blood.  You cough up more blood than before. MAKE SURE YOU:  Understand these instructions.  Will watch your condition.  Will get help right away if you are not doing well or get worse.   This information is not intended to replace advice given to you by your health care provider. Make sure you discuss any questions you have with your health care provider.   Document Released: 01/23/2009 Document Revised: 04/02/2013 Document Reviewed: 11/30/2012 Elsevier Interactive Patient Education 2016 [place].    Nothing to eat or drink until     10:45 am  Today         03/19/2015

## 2015-03-19 NOTE — Op Note (Signed)
Indication: Unexplained hemoptysis in the 74 -year-old never smoker  Written informed consent was obtained from the patient prior to the procedure. The risks of the procedure including coughing, bleeding and a small chance of lung cancer requiring a chest tube were discussed with the patient in great detail and evidenced understanding.  2 mg of Versed and  87mcg of fentanyl were used in divided doses during the procedure. Bronchoscope was inserted from the right Nare. The upper airway appeared normal. Vocal cord showed normal appearance in motion. The trachea bronchial tree was then examined to the subsegmental level. Mild white secretions were noted. No endobronchial lesions were noted.  Attention was then turned to the right lower lobe. Bronchoalveolar lavage was obtained from the right lower lobe with good return.  The patient tolerated procedure well with minimal bleeding & transient desaturation  She was awake and alert in the end of the procedure.   Rigoberto Noel MD [phone]

## 2015-03-19 NOTE — Progress Notes (Signed)
Video Bronchoscopy Done   Intervention Bronchial Washing done Procedure tolerated well 

## 2015-03-19 NOTE — H&P (View-Only) (Signed)
Subjective:    Patient ID: Cassandra Brooks, female    DOB: 17-Nov-1940, 74 y.o.   MRN: [id number]  HPI  Chief Complaint  Patient presents with  . Pulmonary Consult    Referred by Dr. Manson Allan; coughing up bloody sputum.  hx of pneumonia, will not go away with antibiotics.  Lung nodules on CT from 2015.  Abnormal CT Chest on 03/12/15.     74 year old never smoker presents for evaluation of hemoptysis. She is accompanied by her husband who provides most of the history  She reports that for the past year she has been bringing up blood specked scabs With her cough .this happens at least once a week .she denies associated respiratory distress , wheezing , orthopnea or paroxysmal nocturnal dyspnea .she has seen her PCP and was given antibiotics 2 courses for pneumonia  Chest x-ray on 11/4 in 11/23 suggested a left lower lobe infiltrate however CT chest did not show any convincing evidence of pneumonia or pulmonary edema .  She had an ENT evaluation which was negative for bleeding source in her upper airway . She denies bleeding gums or dental issues . She reports that she always has a dry nose .  About 12 years ago , she developed cough and respiratory distress . She is to Chiropractor and was told that this may have been related to exposure to birds .   Significant tests/ events  CT chest 03/12/15 56mm nodule in RLL, no convincing pna or edema    Past Medical History  Diagnosis Date  . Hypertension   . Diabetes mellitus without complication ([place]) boderline  . Glaucoma   . AAA (abdominal aortic aneurysm) [facility])     Past Surgical History  Procedure Laterality Date  . Abdominal surgery    . Abdominal hysterectomy      No Known Allergies  Social History   Social History  . Marital Status: Married    Spouse Name: N/A  . Number of Children: N/A  . Years of Education: N/A   Occupational History  . Not on file.   Social History Main Topics  . Smoking status: Never Smoker   .  Smokeless tobacco: Not on file  . Alcohol Use: No  . Drug Use: Not on file  . Sexual Activity: Not on file   Other Topics Concern  . Not on file   Social History Narrative    No family history on file. No Family history of coronary artery disease  Review of Systems  Constitutional: Negative for fever, chills and unexpected weight change.  HENT: Negative for congestion, dental problem, ear pain, nosebleeds, postnasal drip, rhinorrhea, sinus pressure, sneezing, sore throat, trouble swallowing and voice change.   Eyes: Negative for visual disturbance.  Respiratory: Positive for cough and shortness of breath. Negative for choking.   Cardiovascular: Negative for chest pain and leg swelling.  Gastrointestinal: Negative for vomiting, abdominal pain and diarrhea.  Genitourinary: Negative for difficulty urinating.  Musculoskeletal: Negative for arthralgias.  Skin: Negative for rash.  Neurological: Negative for tremors, syncope and headaches.  Hematological: Does not bruise/bleed easily.       Objective:   Physical Exam  Gen. Pleasant, well-nourished, in no distress, normal affect ENT - no lesions, no post nasal drip Neck: No JVD, no thyromegaly, no carotid bruits Lungs: no use of accessory muscles, no dullness to percussion, clear without rales or rhonchi  Cardiovascular: Rhythm regular, heart sounds  normal, no murmurs or gallops, no peripheral edema Abdomen: soft  and non-tender, no hepatosplenomegaly, BS normal. Musculoskeletal: No deformities, no cyanosis or clubbing Neuro:  alert, non focal       Assessment & Plan:

## 2015-03-20 ENCOUNTER — Encounter (HOSPITAL_COMMUNITY): Payer: Self-pay | Admitting: Pulmonary Disease

## 2015-03-22 LAB — CULTURE, BAL-QUANTITATIVE W GRAM STAIN
Colony Count: >=100000
Special Requests: NORMAL

## 2015-04-29 DIAGNOSIS — J208 Acute bronchitis due to other specified organisms: Secondary | ICD-10-CM | POA: Diagnosis not present

## 2015-04-29 DIAGNOSIS — Z6829 Body mass index (BMI) 29.0-29.9, adult: Secondary | ICD-10-CM | POA: Diagnosis not present

## 2015-05-01 LAB — AFB CULTURE WITH SMEAR (NOT AT ARMC)
ACID FAST SMEAR: NONE SEEN
SPECIAL REQUESTS: NORMAL

## 2015-07-02 ENCOUNTER — Ambulatory Visit: Payer: Medicare Other | Admitting: Pulmonary Disease

## 2015-07-03 DIAGNOSIS — H401112 Primary open-angle glaucoma, right eye, moderate stage: Secondary | ICD-10-CM | POA: Diagnosis not present

## 2015-07-03 DIAGNOSIS — H401121 Primary open-angle glaucoma, left eye, mild stage: Secondary | ICD-10-CM | POA: Diagnosis not present

## 2015-07-09 ENCOUNTER — Ambulatory Visit (INDEPENDENT_AMBULATORY_CARE_PROVIDER_SITE_OTHER): Payer: Medicare Other | Admitting: Pulmonary Disease

## 2015-07-09 ENCOUNTER — Encounter: Payer: Self-pay | Admitting: Pulmonary Disease

## 2015-07-09 DIAGNOSIS — R911 Solitary pulmonary nodule: Secondary | ICD-10-CM | POA: Diagnosis not present

## 2015-07-09 DIAGNOSIS — Z23 Encounter for immunization: Secondary | ICD-10-CM | POA: Diagnosis not present

## 2015-07-09 NOTE — Patient Instructions (Signed)
CT chest no contrast in June 2017

## 2015-07-09 NOTE — Progress Notes (Signed)
   Subjective:    Patient ID: Cassandra Brooks, female    DOB: 26-Nov-1940, 75 y.o.   MRN: [id number]  HPI  75 year old never smoker for FU of hemoptysis 03/2015, neg bscopy.   07/09/2015  Chief Complaint  Patient presents with  . Follow-up    Pt denies any breathing issues since bronch. Here for results.    Breathing ok No more hemoptysis   She had an ENT evaluation which was negative for bleeding source in her upper airway . She denies bleeding gums or dental issues . She reports that she always has a dry nose .     Significant tests/ events  CT chest 03/12/15 25mm nodule in RLL, no convincing pna or edema 03/2015 Bronchoscopy nml   Review of Systems Patient denies significant dyspnea,cough, hemoptysis,  chest pain, palpitations, pedal edema, orthopnea, paroxysmal nocturnal dyspnea, lightheadedness, nausea, vomiting, abdominal or  leg pains      Objective:   Physical Exam  Gen. Pleasant, well-nourished, in no distress ENT - no lesions, no post nasal drip Neck: No JVD, no thyromegaly, no carotid bruits Lungs: no use of accessory muscles, no dullness to percussion, clear without rales or rhonchi  Cardiovascular: Rhythm regular, heart sounds  normal, no murmurs or gallops, no peripheral edema Musculoskeletal: No deformities, no cyanosis or clubbing        Assessment & Plan:

## 2015-07-09 NOTE — Assessment & Plan Note (Signed)
resolved 

## 2015-07-09 NOTE — Assessment & Plan Note (Signed)
26m FU CT chest no contrast in June 2017 - if stable, annual FU

## 2015-07-15 DIAGNOSIS — H401121 Primary open-angle glaucoma, left eye, mild stage: Secondary | ICD-10-CM | POA: Diagnosis not present

## 2015-07-15 DIAGNOSIS — H2513 Age-related nuclear cataract, bilateral: Secondary | ICD-10-CM | POA: Diagnosis not present

## 2015-07-15 DIAGNOSIS — H52203 Unspecified astigmatism, bilateral: Secondary | ICD-10-CM | POA: Diagnosis not present

## 2015-07-15 DIAGNOSIS — H524 Presbyopia: Secondary | ICD-10-CM | POA: Diagnosis not present

## 2015-07-15 DIAGNOSIS — H401112 Primary open-angle glaucoma, right eye, moderate stage: Secondary | ICD-10-CM | POA: Diagnosis not present

## 2015-07-28 DIAGNOSIS — M1990 Unspecified osteoarthritis, unspecified site: Secondary | ICD-10-CM | POA: Diagnosis not present

## 2015-07-28 DIAGNOSIS — I10 Essential (primary) hypertension: Secondary | ICD-10-CM | POA: Diagnosis not present

## 2015-07-28 DIAGNOSIS — E785 Hyperlipidemia, unspecified: Secondary | ICD-10-CM | POA: Diagnosis not present

## 2015-07-28 DIAGNOSIS — Z1389 Encounter for screening for other disorder: Secondary | ICD-10-CM | POA: Diagnosis not present

## 2015-07-28 DIAGNOSIS — M8580 Other specified disorders of bone density and structure, unspecified site: Secondary | ICD-10-CM | POA: Diagnosis not present

## 2015-07-28 DIAGNOSIS — E669 Obesity, unspecified: Secondary | ICD-10-CM | POA: Diagnosis not present

## 2015-07-28 DIAGNOSIS — F329 Major depressive disorder, single episode, unspecified: Secondary | ICD-10-CM | POA: Diagnosis not present

## 2015-07-28 DIAGNOSIS — K219 Gastro-esophageal reflux disease without esophagitis: Secondary | ICD-10-CM | POA: Diagnosis not present

## 2015-07-28 DIAGNOSIS — Z6831 Body mass index (BMI) 31.0-31.9, adult: Secondary | ICD-10-CM | POA: Diagnosis not present

## 2015-09-28 ENCOUNTER — Ambulatory Visit (INDEPENDENT_AMBULATORY_CARE_PROVIDER_SITE_OTHER)
Admission: RE | Admit: 2015-09-28 | Discharge: 2015-09-28 | Disposition: A | Discharge status: Home / Self Care | Payer: Medicare Other | Source: Ambulatory Visit | Attending: Pulmonary Disease | Admitting: Pulmonary Disease

## 2015-09-28 DIAGNOSIS — R911 Solitary pulmonary nodule: Secondary | ICD-10-CM

## 2015-10-07 ENCOUNTER — Telehealth: Payer: Self-pay | Admitting: Pulmonary Disease

## 2015-10-07 DIAGNOSIS — R911 Solitary pulmonary nodule: Secondary | ICD-10-CM

## 2015-10-07 NOTE — Telephone Encounter (Signed)
Result Note     Stable nodule compared to 6 months ago-likely represents a lymph node    Recommend follow-up CT in 1 year    LMTCB x1

## 2015-10-08 NOTE — Telephone Encounter (Signed)
Called spoke with pt. Reviewed results and recs. Pt voiced understanding and had no further questions. Order placed. Nothing further needed.

## 2015-10-28 DIAGNOSIS — K219 Gastro-esophageal reflux disease without esophagitis: Secondary | ICD-10-CM | POA: Diagnosis not present

## 2015-10-28 DIAGNOSIS — I10 Essential (primary) hypertension: Secondary | ICD-10-CM | POA: Diagnosis not present

## 2015-10-28 DIAGNOSIS — Z6831 Body mass index (BMI) 31.0-31.9, adult: Secondary | ICD-10-CM | POA: Diagnosis not present

## 2015-10-28 DIAGNOSIS — M25562 Pain in left knee: Secondary | ICD-10-CM | POA: Diagnosis not present

## 2015-10-28 DIAGNOSIS — F329 Major depressive disorder, single episode, unspecified: Secondary | ICD-10-CM | POA: Diagnosis not present

## 2015-10-28 DIAGNOSIS — M1990 Unspecified osteoarthritis, unspecified site: Secondary | ICD-10-CM | POA: Diagnosis not present

## 2015-11-12 DIAGNOSIS — M1712 Unilateral primary osteoarthritis, left knee: Secondary | ICD-10-CM | POA: Diagnosis not present

## 2015-11-12 DIAGNOSIS — M1711 Unilateral primary osteoarthritis, right knee: Secondary | ICD-10-CM | POA: Diagnosis not present

## 2015-12-24 DIAGNOSIS — M1712 Unilateral primary osteoarthritis, left knee: Secondary | ICD-10-CM | POA: Diagnosis not present

## 2015-12-24 DIAGNOSIS — M1711 Unilateral primary osteoarthritis, right knee: Secondary | ICD-10-CM | POA: Diagnosis not present

## 2016-01-18 DIAGNOSIS — H2513 Age-related nuclear cataract, bilateral: Secondary | ICD-10-CM | POA: Diagnosis not present

## 2016-01-18 DIAGNOSIS — H401112 Primary open-angle glaucoma, right eye, moderate stage: Secondary | ICD-10-CM | POA: Diagnosis not present

## 2016-01-18 DIAGNOSIS — H401121 Primary open-angle glaucoma, left eye, mild stage: Secondary | ICD-10-CM | POA: Diagnosis not present

## 2016-02-05 DIAGNOSIS — M1990 Unspecified osteoarthritis, unspecified site: Secondary | ICD-10-CM | POA: Diagnosis not present

## 2016-02-05 DIAGNOSIS — E669 Obesity, unspecified: Secondary | ICD-10-CM | POA: Diagnosis not present

## 2016-02-05 DIAGNOSIS — F329 Major depressive disorder, single episode, unspecified: Secondary | ICD-10-CM | POA: Diagnosis not present

## 2016-02-05 DIAGNOSIS — I10 Essential (primary) hypertension: Secondary | ICD-10-CM | POA: Diagnosis not present

## 2016-02-05 DIAGNOSIS — Z1231 Encounter for screening mammogram for malignant neoplasm of breast: Secondary | ICD-10-CM | POA: Diagnosis not present

## 2016-02-05 DIAGNOSIS — E785 Hyperlipidemia, unspecified: Secondary | ICD-10-CM | POA: Diagnosis not present

## 2016-02-05 DIAGNOSIS — Z2821 Immunization not carried out because of patient refusal: Secondary | ICD-10-CM | POA: Diagnosis not present

## 2016-02-05 DIAGNOSIS — Z9181 History of falling: Secondary | ICD-10-CM | POA: Diagnosis not present

## 2016-02-05 DIAGNOSIS — K219 Gastro-esophageal reflux disease without esophagitis: Secondary | ICD-10-CM | POA: Diagnosis not present

## 2016-02-05 DIAGNOSIS — B0229 Other postherpetic nervous system involvement: Secondary | ICD-10-CM | POA: Diagnosis not present

## 2016-02-19 DIAGNOSIS — Z1231 Encounter for screening mammogram for malignant neoplasm of breast: Secondary | ICD-10-CM | POA: Diagnosis not present

## 2016-02-22 ENCOUNTER — Telehealth (INDEPENDENT_AMBULATORY_CARE_PROVIDER_SITE_OTHER): Payer: Self-pay | Admitting: Orthopaedic Surgery

## 2016-02-22 NOTE — Telephone Encounter (Signed)
Patient's husband (ozzie) called needing a call back to let him know if there Rx has been received for his wife's knee injection.  The number to contact him is [phone]

## 2016-02-24 NOTE — Telephone Encounter (Signed)
Patient will be coming in Friday to get monovisc injection at 1:45 pm

## 2016-02-26 ENCOUNTER — Ambulatory Visit (INDEPENDENT_AMBULATORY_CARE_PROVIDER_SITE_OTHER): Payer: Medicare Other | Admitting: Orthopaedic Surgery

## 2016-02-26 ENCOUNTER — Encounter (INDEPENDENT_AMBULATORY_CARE_PROVIDER_SITE_OTHER): Payer: Self-pay | Admitting: Orthopaedic Surgery

## 2016-02-26 DIAGNOSIS — M1712 Unilateral primary osteoarthritis, left knee: Secondary | ICD-10-CM | POA: Insufficient documentation

## 2016-02-26 MED ORDER — HYLAN G-F 20 48 MG/6ML IX SOSY
48.0000 mg | PREFILLED_SYRINGE | INTRA_ARTICULAR | Status: AC | PRN
  Administered 2016-02-26: 48 mg via INTRA_ARTICULAR

## 2016-02-26 NOTE — Progress Notes (Signed)
   Procedure Note  Patient: Cassandra Brooks             Date of Birth: 02-10-1941           MRN: [id number]             Visit Date: 02/26/2016  Procedures: Visit Diagnoses: Unilateral primary osteoarthritis, left knee  Large Joint Inj Date/Time: 02/26/2016 5:15 PM Performed by: Leandrew Koyanagi Authorized by: Leandrew Koyanagi   Consent Given by:  Patient Timeout: prior to procedure the correct patient, procedure, and site was verified   Indications:  Pain Location:  Knee Site:  R knee Prep: patient was prepped and draped in usual sterile fashion   Needle Size:  22 G Approach:  Anterolateral Ultrasound Guidance: No   Fluoroscopic Guidance: No   Arthrogram: No   Medications:  48 mg Hylan 48 MG/6ML

## 2016-02-26 NOTE — Progress Notes (Signed)
Cassandra Brooks came in today for a Synvisc injection of her left knee which she tolerated well under sterile conditions. Patient had no immediate complications. Follow-up with me as needed.

## 2016-02-26 NOTE — Patient Instructions (Signed)
You have received an injection into the joint. 15% of patients will have increased pain within the 24 hours postinjection.   This is transient and will go away.   We recommend that you use ice packs on the injection site for 20 minutes every 2 hours and extra strength Tylenol 2 tablets every 8 as needed until the pain resolves.  If you continue to have pain after taking the Tylenol and using the ice please call the office for further instructions.  

## 2016-03-14 DIAGNOSIS — L578 Other skin changes due to chronic exposure to nonionizing radiation: Secondary | ICD-10-CM | POA: Diagnosis not present

## 2016-03-14 DIAGNOSIS — L570 Actinic keratosis: Secondary | ICD-10-CM | POA: Diagnosis not present

## 2016-03-14 DIAGNOSIS — L821 Other seborrheic keratosis: Secondary | ICD-10-CM | POA: Diagnosis not present

## 2016-06-07 DIAGNOSIS — Z1389 Encounter for screening for other disorder: Secondary | ICD-10-CM | POA: Diagnosis not present

## 2016-06-07 DIAGNOSIS — Z136 Encounter for screening for cardiovascular disorders: Secondary | ICD-10-CM | POA: Diagnosis not present

## 2016-06-07 DIAGNOSIS — Z1231 Encounter for screening mammogram for malignant neoplasm of breast: Secondary | ICD-10-CM | POA: Diagnosis not present

## 2016-06-07 DIAGNOSIS — Z9181 History of falling: Secondary | ICD-10-CM | POA: Diagnosis not present

## 2016-06-07 DIAGNOSIS — N959 Unspecified menopausal and perimenopausal disorder: Secondary | ICD-10-CM | POA: Diagnosis not present

## 2016-06-07 DIAGNOSIS — Z23 Encounter for immunization: Secondary | ICD-10-CM | POA: Diagnosis not present

## 2016-06-07 DIAGNOSIS — E669 Obesity, unspecified: Secondary | ICD-10-CM | POA: Diagnosis not present

## 2016-06-07 DIAGNOSIS — Z1211 Encounter for screening for malignant neoplasm of colon: Secondary | ICD-10-CM | POA: Diagnosis not present

## 2016-06-07 DIAGNOSIS — R7301 Impaired fasting glucose: Secondary | ICD-10-CM | POA: Diagnosis not present

## 2016-06-07 DIAGNOSIS — Z0000 Encounter for general adult medical examination without abnormal findings: Secondary | ICD-10-CM | POA: Diagnosis not present

## 2016-06-14 DIAGNOSIS — M1990 Unspecified osteoarthritis, unspecified site: Secondary | ICD-10-CM | POA: Diagnosis not present

## 2016-06-14 DIAGNOSIS — R7301 Impaired fasting glucose: Secondary | ICD-10-CM | POA: Diagnosis not present

## 2016-06-14 DIAGNOSIS — F329 Major depressive disorder, single episode, unspecified: Secondary | ICD-10-CM | POA: Diagnosis not present

## 2016-06-14 DIAGNOSIS — E785 Hyperlipidemia, unspecified: Secondary | ICD-10-CM | POA: Diagnosis not present

## 2016-06-14 DIAGNOSIS — K219 Gastro-esophageal reflux disease without esophagitis: Secondary | ICD-10-CM | POA: Diagnosis not present

## 2016-06-14 DIAGNOSIS — I10 Essential (primary) hypertension: Secondary | ICD-10-CM | POA: Diagnosis not present

## 2016-08-09 DIAGNOSIS — H401121 Primary open-angle glaucoma, left eye, mild stage: Secondary | ICD-10-CM | POA: Diagnosis not present

## 2016-08-09 DIAGNOSIS — H401112 Primary open-angle glaucoma, right eye, moderate stage: Secondary | ICD-10-CM | POA: Diagnosis not present

## 2016-08-24 DIAGNOSIS — H401112 Primary open-angle glaucoma, right eye, moderate stage: Secondary | ICD-10-CM | POA: Diagnosis not present

## 2016-08-24 DIAGNOSIS — H401121 Primary open-angle glaucoma, left eye, mild stage: Secondary | ICD-10-CM | POA: Diagnosis not present

## 2016-10-07 ENCOUNTER — Ambulatory Visit (INDEPENDENT_AMBULATORY_CARE_PROVIDER_SITE_OTHER)
Admission: RE | Admit: 2016-10-07 | Discharge: 2016-10-07 | Disposition: A | Discharge status: Home / Self Care | Payer: Medicare Other | Source: Ambulatory Visit | Attending: Pulmonary Disease | Admitting: Pulmonary Disease

## 2016-10-07 DIAGNOSIS — R911 Solitary pulmonary nodule: Secondary | ICD-10-CM

## 2016-10-10 ENCOUNTER — Ambulatory Visit (INDEPENDENT_AMBULATORY_CARE_PROVIDER_SITE_OTHER): Payer: Medicare Other | Admitting: Orthopaedic Surgery

## 2016-10-11 ENCOUNTER — Other Ambulatory Visit: Payer: Self-pay | Admitting: Pulmonary Disease

## 2016-10-11 ENCOUNTER — Telehealth (INDEPENDENT_AMBULATORY_CARE_PROVIDER_SITE_OTHER): Payer: Self-pay | Admitting: Radiology

## 2016-10-11 ENCOUNTER — Ambulatory Visit (INDEPENDENT_AMBULATORY_CARE_PROVIDER_SITE_OTHER): Payer: Self-pay

## 2016-10-11 ENCOUNTER — Ambulatory Visit (INDEPENDENT_AMBULATORY_CARE_PROVIDER_SITE_OTHER): Payer: Medicare Other | Admitting: Orthopaedic Surgery

## 2016-10-11 DIAGNOSIS — R911 Solitary pulmonary nodule: Secondary | ICD-10-CM

## 2016-10-11 DIAGNOSIS — M1712 Unilateral primary osteoarthritis, left knee: Secondary | ICD-10-CM | POA: Diagnosis not present

## 2016-10-11 MED ORDER — BUPIVACAINE HCL 0.5 % IJ SOLN
2.0000 mL | INTRAMUSCULAR | Status: AC | PRN
  Administered 2016-10-11: 2 mL via INTRA_ARTICULAR

## 2016-10-11 MED ORDER — LIDOCAINE HCL 1 % IJ SOLN
2.0000 mL | INTRAMUSCULAR | Status: AC | PRN
  Administered 2016-10-11: 2 mL

## 2016-10-11 MED ORDER — METHYLPREDNISOLONE ACETATE 40 MG/ML IJ SUSP
40.0000 mg | INTRAMUSCULAR | Status: AC | PRN
  Administered 2016-10-11: 40 mg via INTRA_ARTICULAR

## 2016-10-11 NOTE — Telephone Encounter (Signed)
Needs monovisc Josem Kaufmann

## 2016-10-11 NOTE — Progress Notes (Signed)
   Office Visit Note   Patient: Cassandra Brooks           Date of Birth: Mar 11, 1941           MRN: [id number] Visit Date: 10/11/2016              Requested by: Lowella Dandy, NP [place], [place] [address] PCP: Lowella Dandy, NP   Assessment & Plan: Visit Diagnoses:  1. Unilateral primary osteoarthritis, left knee     Plan: Left knee cortisone injection was performed today. Prior authorization for monovisc injection submitted.  Follow-Up Instructions: Return if symptoms worsen or fail to improve.   Orders:  No orders of the defined types were placed in this encounter.  No orders of the defined types were placed in this encounter.     Procedures: Large Joint Inj Date/Time: 10/11/2016 1:34 PM Performed by: Leandrew Koyanagi Authorized by: Leandrew Koyanagi   Consent Given by:  Patient Timeout: prior to procedure the correct patient, procedure, and site was verified   Indications:  Pain Location:  Knee Site:  L knee Prep: patient was prepped and draped in usual sterile fashion   Needle Size:  22 G Ultrasound Guidance: No   Fluoroscopic Guidance: No   Arthrogram: No   Medications:  2 mL lidocaine 1 %; 2 mL bupivacaine 0.5 %; 40 mg methylPREDNISolone acetate 40 MG/ML Patient tolerance:  Patient tolerated the procedure well with no immediate complications     Clinical Data: No additional findings.   Subjective: Chief Complaint  Patient presents with  . Left Knee - Pain    Cassandra Brooks is following up today for her left knee osteoarthritis. She had x-rays about a year ago which showed advanced degenerative joint disease. She is about 50% better from the previous injection. She has had a model disc injection in the past with good relief. She is requesting that we submit the approval for another injection. She is requesting a cortisone injection today.    Review of Systems   Objective: Vital Signs: There were no vitals taken for this visit.  Physical  Exam  Ortho Exam Left knee exam is stable. Specialty Comments:  No specialty comments available.  Imaging: No results found.   PMFS History: Patient Active Problem List   Diagnosis Date Noted  . Unilateral primary osteoarthritis, left knee 02/26/2016  . Hemoptysis 03/18/2015  . Solitary pulmonary nodule 03/18/2015   Past Medical History:  Diagnosis Date  . AAA (abdominal aortic aneurysm) ([place])   . Diabetes mellitus without complication ([place]) boderline  . Glaucoma   . Hypertension     No family history on file.  Past Surgical History:  Procedure Laterality Date  . ABDOMINAL HYSTERECTOMY    . ABDOMINAL SURGERY    . VIDEO BRONCHOSCOPY Bilateral 03/19/2015   Procedure: VIDEO BRONCHOSCOPY WITHOUT FLUORO;  Surgeon: Rigoberto Noel, MD;  Location: [facility];  Service: Cardiopulmonary;  Laterality: Bilateral;   Social History   Occupational History  . Not on file.   Social History Main Topics  . Smoking status: Never Smoker  . Smokeless tobacco: Not on file  . Alcohol use No  . Drug use: Unknown  . Sexual activity: Not on file

## 2016-10-11 NOTE — Progress Notes (Signed)
Spoke with patient and informed her of results and recommendations. Pt verbalized understanding and did not have any questions. Nothing further is needed.

## 2016-10-11 NOTE — Progress Notes (Signed)
Order placed for rescan. Nothing further is needed.

## 2016-10-21 NOTE — Telephone Encounter (Signed)
I called and lmom that her approx part of the injection would be $200 give or take some.  I advised that if she wants have the injection to call the office back and make an appt with Dr. Xu-------FYI

## 2016-10-21 NOTE — Telephone Encounter (Signed)
Cassandra Brooks Self [phone]  [name] returned call, I let her know her part of the injection is $200, so she is going to discuss with husband and call back on Monday.

## 2016-10-24 NOTE — Telephone Encounter (Signed)
See message below °

## 2016-10-25 ENCOUNTER — Telehealth (INDEPENDENT_AMBULATORY_CARE_PROVIDER_SITE_OTHER): Payer: Self-pay | Admitting: Orthopaedic Surgery

## 2016-10-25 NOTE — Telephone Encounter (Signed)
Patient returned call stating she will pay her part for the injection ($200.00) but she want to wait 3 to 4 months before setting up the appointment. The number to contact patient is [phone]

## 2016-10-25 NOTE — Telephone Encounter (Signed)
We will wait for pt to call us when she is ready

## 2016-10-25 NOTE — Telephone Encounter (Signed)
Will wait for her response.

## 2016-10-26 DIAGNOSIS — S39013A Strain of muscle, fascia and tendon of pelvis, initial encounter: Secondary | ICD-10-CM | POA: Diagnosis not present

## 2016-10-26 DIAGNOSIS — F329 Major depressive disorder, single episode, unspecified: Secondary | ICD-10-CM | POA: Diagnosis not present

## 2016-10-26 DIAGNOSIS — M8580 Other specified disorders of bone density and structure, unspecified site: Secondary | ICD-10-CM | POA: Diagnosis not present

## 2016-10-26 DIAGNOSIS — I10 Essential (primary) hypertension: Secondary | ICD-10-CM | POA: Diagnosis not present

## 2016-10-26 DIAGNOSIS — M1990 Unspecified osteoarthritis, unspecified site: Secondary | ICD-10-CM | POA: Diagnosis not present

## 2016-10-26 DIAGNOSIS — E785 Hyperlipidemia, unspecified: Secondary | ICD-10-CM | POA: Diagnosis not present

## 2016-11-02 DIAGNOSIS — Z1211 Encounter for screening for malignant neoplasm of colon: Secondary | ICD-10-CM | POA: Diagnosis not present

## 2016-11-02 HISTORY — PX: COLONOSCOPY: SHX174

## 2016-11-02 LAB — HM COLONOSCOPY

## 2016-11-17 DIAGNOSIS — I10 Essential (primary) hypertension: Secondary | ICD-10-CM | POA: Diagnosis not present

## 2016-11-17 DIAGNOSIS — Z6830 Body mass index (BMI) 30.0-30.9, adult: Secondary | ICD-10-CM | POA: Diagnosis not present

## 2016-11-17 DIAGNOSIS — M5431 Sciatica, right side: Secondary | ICD-10-CM | POA: Diagnosis not present

## 2016-12-06 ENCOUNTER — Ambulatory Visit (INDEPENDENT_AMBULATORY_CARE_PROVIDER_SITE_OTHER): Payer: Medicare Other

## 2016-12-06 ENCOUNTER — Ambulatory Visit (INDEPENDENT_AMBULATORY_CARE_PROVIDER_SITE_OTHER): Payer: Medicare Other | Admitting: Orthopaedic Surgery

## 2016-12-06 ENCOUNTER — Other Ambulatory Visit (INDEPENDENT_AMBULATORY_CARE_PROVIDER_SITE_OTHER): Payer: Self-pay | Admitting: Orthopaedic Surgery

## 2016-12-06 DIAGNOSIS — M25551 Pain in right hip: Secondary | ICD-10-CM

## 2016-12-06 MED ORDER — TIZANIDINE HCL 4 MG PO TABS: 4.0000 mg | ORAL_TABLET | Freq: Four times a day (QID) | ORAL | 2 refills | Status: DC | PRN

## 2016-12-06 MED ORDER — PREDNISONE 10 MG (21) PO TBPK: ORAL_TABLET | ORAL | 0 refills | Status: DC

## 2016-12-06 NOTE — Progress Notes (Signed)
Office Visit Note   Patient: Cassandra Brooks           Date of Birth: 1940/07/29           MRN: [id number] Visit Date: 12/06/2016              Requested by: Lowella Dandy, NP [place], [place] [address] PCP: Lowella Dandy, NP   Assessment & Plan: Visit Diagnoses:  1. Pain of right hip joint     Plan: Overall impression is lumbar radiculopathy. We'll try prednisone taper and Zanaflex see if this will calm it down. If not better she should give [place] a call and we'll get an MRI.  Follow-Up Instructions: Return if symptoms worsen or fail to improve.   Orders:  Orders Placed This Encounter  Procedures  . XR HIP UNILAT W OR W/O PELVIS 2-3 VIEWS RIGHT  . XR Lumbar Spine 2-3 Views   Meds ordered this encounter  Medications  . predniSONE (STERAPRED UNI-PAK 21 TAB) 10 MG (21) TBPK tablet    Sig: Take as directed    Dispense:  21 tablet    Refill:  0  . tiZANidine (ZANAFLEX) 4 MG tablet    Sig: Take 1 tablet (4 mg total) by mouth every 6 (six) hours as needed for muscle spasms.    Dispense:  30 tablet    Refill:  2      Procedures: No procedures performed   Clinical Data: No additional findings.   Subjective: Chief Complaint  Patient presents with  . Lower Back - Pain  . Right Hip - Pain  . Establish Care    Pt stated having pain lower back/groin pain going down to the leg for about 2 months    76 year old female comes in with 2 month history of right buttock and groin pain that radiates down into her leg and right foot. Her toes are numb. She denies a broad-based gait. Denies any constitutional symptoms.    Review of Systems  Constitutional: Negative.   HENT: Negative.   Eyes: Negative.   Respiratory: Negative.   Cardiovascular: Negative.   Endocrine: Negative.   Musculoskeletal: Negative.   Neurological: Negative.   Hematological: Negative.   Psychiatric/Behavioral: Negative.   All other systems reviewed and are  negative.    Objective: Vital Signs: There were no vitals taken for this visit.  Physical Exam  Constitutional: She is oriented to person, place, and time. She appears well-developed and well-nourished.  Pulmonary/Chest: Effort normal.  Neurological: She is alert and oriented to person, place, and time.  Skin: Skin is warm. Capillary refill takes less than 2 seconds.  Psychiatric: She has a normal mood and affect. Her behavior is normal. Judgment and thought content normal.  Nursing note and vitals reviewed.   Ortho Exam Right hip exam shows good range of motion with very mild pain. Positive straight leg raise test. Positive Stinchfield sign. Lateral hip is nontender. Specialty Comments:  No specialty comments available.  Imaging: Xr Hip Unilat W Or W/o Pelvis 2-3 Views Right  Result Date: 12/06/2016 Relatively normal hip joints  Xr Lumbar Spine 2-3 Views  Result Date: 12/06/2016 Severe degenerative disc disease and lumbar spondylosis    PMFS History: Patient Active Problem List   Diagnosis Date Noted  . Unilateral primary osteoarthritis, left knee 02/26/2016  . Hemoptysis 03/18/2015  . Solitary pulmonary nodule 03/18/2015   Past Medical History:  Diagnosis Date  . AAA (abdominal aortic aneurysm) ([place])   .  Diabetes mellitus without complication ([place]) boderline  . Glaucoma   . Hypertension     No family history on file.  Past Surgical History:  Procedure Laterality Date  . ABDOMINAL HYSTERECTOMY    . ABDOMINAL SURGERY    . VIDEO BRONCHOSCOPY Bilateral 03/19/2015   Procedure: VIDEO BRONCHOSCOPY WITHOUT FLUORO;  Surgeon: Rigoberto Noel, MD;  Location: [facility];  Service: Cardiopulmonary;  Laterality: Bilateral;   Social History   Occupational History  . Not on file.   Social History Main Topics  . Smoking status: Never Smoker  . Smokeless tobacco: Not on file  . Alcohol use No  . Drug use: Unknown  . Sexual activity: Not on file

## 2016-12-22 DIAGNOSIS — M5431 Sciatica, right side: Secondary | ICD-10-CM | POA: Diagnosis not present

## 2016-12-22 DIAGNOSIS — I10 Essential (primary) hypertension: Secondary | ICD-10-CM | POA: Diagnosis not present

## 2016-12-27 DIAGNOSIS — M5126 Other intervertebral disc displacement, lumbar region: Secondary | ICD-10-CM | POA: Diagnosis not present

## 2016-12-27 DIAGNOSIS — M5136 Other intervertebral disc degeneration, lumbar region: Secondary | ICD-10-CM | POA: Diagnosis not present

## 2016-12-27 DIAGNOSIS — M545 Low back pain: Secondary | ICD-10-CM | POA: Diagnosis not present

## 2016-12-27 DIAGNOSIS — M5431 Sciatica, right side: Secondary | ICD-10-CM | POA: Diagnosis not present

## 2017-01-04 DIAGNOSIS — M47817 Spondylosis without myelopathy or radiculopathy, lumbosacral region: Secondary | ICD-10-CM | POA: Diagnosis not present

## 2017-01-04 DIAGNOSIS — M5431 Sciatica, right side: Secondary | ICD-10-CM | POA: Diagnosis not present

## 2017-01-04 DIAGNOSIS — M48061 Spinal stenosis, lumbar region without neurogenic claudication: Secondary | ICD-10-CM | POA: Diagnosis not present

## 2017-01-27 DIAGNOSIS — M5431 Sciatica, right side: Secondary | ICD-10-CM | POA: Diagnosis not present

## 2017-01-27 DIAGNOSIS — M47817 Spondylosis without myelopathy or radiculopathy, lumbosacral region: Secondary | ICD-10-CM | POA: Diagnosis not present

## 2017-02-21 DIAGNOSIS — E785 Hyperlipidemia, unspecified: Secondary | ICD-10-CM | POA: Diagnosis not present

## 2017-02-21 DIAGNOSIS — R7301 Impaired fasting glucose: Secondary | ICD-10-CM | POA: Diagnosis not present

## 2017-02-21 DIAGNOSIS — E538 Deficiency of other specified B group vitamins: Secondary | ICD-10-CM | POA: Diagnosis not present

## 2017-02-21 DIAGNOSIS — R531 Weakness: Secondary | ICD-10-CM | POA: Diagnosis not present

## 2017-02-21 DIAGNOSIS — I10 Essential (primary) hypertension: Secondary | ICD-10-CM | POA: Diagnosis not present

## 2017-02-21 DIAGNOSIS — R42 Dizziness and giddiness: Secondary | ICD-10-CM | POA: Diagnosis not present

## 2017-02-21 DIAGNOSIS — F329 Major depressive disorder, single episode, unspecified: Secondary | ICD-10-CM | POA: Diagnosis not present

## 2017-03-21 ENCOUNTER — Inpatient Hospital Stay (HOSPITAL_COMMUNITY)
Admission: EM | Admit: 2017-03-21 | Discharge: 2017-03-27 | DRG: 312 | Disposition: A | Discharge status: Skilled Nursing Facility | Payer: Medicare Other | Attending: Family Medicine | Admitting: Family Medicine

## 2017-03-21 ENCOUNTER — Other Ambulatory Visit: Payer: Self-pay

## 2017-03-21 ENCOUNTER — Encounter (HOSPITAL_COMMUNITY): Payer: Self-pay

## 2017-03-21 ENCOUNTER — Observation Stay (HOSPITAL_COMMUNITY): Payer: Medicare Other

## 2017-03-21 ENCOUNTER — Emergency Department (HOSPITAL_COMMUNITY): Payer: Medicare Other

## 2017-03-21 DIAGNOSIS — E119 Type 2 diabetes mellitus without complications: Secondary | ICD-10-CM | POA: Diagnosis not present

## 2017-03-21 DIAGNOSIS — H409 Unspecified glaucoma: Secondary | ICD-10-CM | POA: Diagnosis present

## 2017-03-21 DIAGNOSIS — Z8612 Personal history of poliomyelitis: Secondary | ICD-10-CM

## 2017-03-21 DIAGNOSIS — R402413 Glasgow coma scale score 13-15, at hospital admission: Secondary | ICD-10-CM | POA: Diagnosis present

## 2017-03-21 DIAGNOSIS — S199XXA Unspecified injury of neck, initial encounter: Secondary | ICD-10-CM | POA: Diagnosis not present

## 2017-03-21 DIAGNOSIS — M160 Bilateral primary osteoarthritis of hip: Secondary | ICD-10-CM | POA: Diagnosis present

## 2017-03-21 DIAGNOSIS — I081 Rheumatic disorders of both mitral and tricuspid valves: Secondary | ICD-10-CM | POA: Diagnosis present

## 2017-03-21 DIAGNOSIS — F419 Anxiety disorder, unspecified: Secondary | ICD-10-CM | POA: Diagnosis present

## 2017-03-21 DIAGNOSIS — W19XXXA Unspecified fall, initial encounter: Secondary | ICD-10-CM

## 2017-03-21 DIAGNOSIS — M4802 Spinal stenosis, cervical region: Secondary | ICD-10-CM | POA: Diagnosis not present

## 2017-03-21 DIAGNOSIS — M170 Bilateral primary osteoarthritis of knee: Secondary | ICD-10-CM | POA: Diagnosis present

## 2017-03-21 DIAGNOSIS — R51 Headache: Secondary | ICD-10-CM | POA: Diagnosis not present

## 2017-03-21 DIAGNOSIS — E785 Hyperlipidemia, unspecified: Secondary | ICD-10-CM | POA: Diagnosis present

## 2017-03-21 DIAGNOSIS — I714 Abdominal aortic aneurysm, without rupture: Secondary | ICD-10-CM | POA: Diagnosis present

## 2017-03-21 DIAGNOSIS — R42 Dizziness and giddiness: Secondary | ICD-10-CM | POA: Diagnosis present

## 2017-03-21 DIAGNOSIS — S0990XA Unspecified injury of head, initial encounter: Secondary | ICD-10-CM | POA: Diagnosis not present

## 2017-03-21 DIAGNOSIS — J9811 Atelectasis: Secondary | ICD-10-CM | POA: Diagnosis not present

## 2017-03-21 DIAGNOSIS — R55 Syncope and collapse: Secondary | ICD-10-CM | POA: Diagnosis not present

## 2017-03-21 DIAGNOSIS — R251 Tremor, unspecified: Secondary | ICD-10-CM | POA: Diagnosis present

## 2017-03-21 DIAGNOSIS — R29702 NIHSS score 2: Secondary | ICD-10-CM | POA: Diagnosis present

## 2017-03-21 DIAGNOSIS — Z8679 Personal history of other diseases of the circulatory system: Secondary | ICD-10-CM

## 2017-03-21 DIAGNOSIS — M479 Spondylosis, unspecified: Secondary | ICD-10-CM | POA: Diagnosis present

## 2017-03-21 DIAGNOSIS — Z79899 Other long term (current) drug therapy: Secondary | ICD-10-CM

## 2017-03-21 DIAGNOSIS — D7589 Other specified diseases of blood and blood-forming organs: Secondary | ICD-10-CM | POA: Diagnosis not present

## 2017-03-21 DIAGNOSIS — K219 Gastro-esophageal reflux disease without esophagitis: Secondary | ICD-10-CM | POA: Diagnosis present

## 2017-03-21 DIAGNOSIS — M25551 Pain in right hip: Secondary | ICD-10-CM | POA: Diagnosis not present

## 2017-03-21 DIAGNOSIS — S4991XA Unspecified injury of right shoulder and upper arm, initial encounter: Secondary | ICD-10-CM | POA: Diagnosis not present

## 2017-03-21 DIAGNOSIS — M79621 Pain in right upper arm: Secondary | ICD-10-CM | POA: Diagnosis not present

## 2017-03-21 DIAGNOSIS — G14 Postpolio syndrome: Secondary | ICD-10-CM | POA: Diagnosis present

## 2017-03-21 DIAGNOSIS — I519 Heart disease, unspecified: Secondary | ICD-10-CM | POA: Diagnosis present

## 2017-03-21 DIAGNOSIS — I119 Hypertensive heart disease without heart failure: Secondary | ICD-10-CM | POA: Diagnosis present

## 2017-03-21 DIAGNOSIS — R002 Palpitations: Secondary | ICD-10-CM | POA: Diagnosis present

## 2017-03-21 DIAGNOSIS — R296 Repeated falls: Secondary | ICD-10-CM | POA: Diagnosis present

## 2017-03-21 DIAGNOSIS — M797 Fibromyalgia: Secondary | ICD-10-CM | POA: Diagnosis present

## 2017-03-21 DIAGNOSIS — Z751 Person awaiting admission to adequate facility elsewhere: Secondary | ICD-10-CM

## 2017-03-21 HISTORY — DX: Major depressive disorder, single episode, unspecified: F32.9

## 2017-03-21 HISTORY — DX: Depression, unspecified: F32.A

## 2017-03-21 HISTORY — DX: Gastro-esophageal reflux disease without esophagitis: K21.9

## 2017-03-21 HISTORY — DX: Other intervertebral disc degeneration, lumbar region without mention of lumbar back pain or lower extremity pain: M51.369

## 2017-03-21 HISTORY — DX: Unspecified osteoarthritis, unspecified site: M19.90

## 2017-03-21 HISTORY — DX: Syncope and collapse: R55

## 2017-03-21 HISTORY — DX: Pure hypercholesterolemia, unspecified: E78.00

## 2017-03-21 HISTORY — DX: Fibromyalgia: M79.7

## 2017-03-21 HISTORY — DX: Unspecified glaucoma: H40.9

## 2017-03-21 HISTORY — DX: Prediabetes: R73.03

## 2017-03-21 HISTORY — DX: Other intervertebral disc degeneration, lumbar region: M51.36

## 2017-03-21 HISTORY — DX: Other intervertebral disc displacement, lumbar region: M51.26

## 2017-03-21 LAB — CBC
HCT: 43.9 % (ref 36.0–46.0)
Hemoglobin: 14.6 g/dL (ref 12.0–15.0)
MCH: 35.6 pg — AB (ref 26.0–34.0)
MCHC: 33.3 g/dL (ref 30.0–36.0)
MCV: 107.1 fL — AB (ref 78.0–100.0)
PLATELETS: 210 10*3/uL (ref 150–400)
RBC: 4.1 MIL/uL (ref 3.87–5.11)
RDW: 13.7 % (ref 11.5–15.5)
WBC: 4 10*3/uL (ref 4.0–10.5)

## 2017-03-21 LAB — URINALYSIS, ROUTINE W REFLEX MICROSCOPIC
BILIRUBIN URINE: NEGATIVE
GLUCOSE, UA: NEGATIVE mg/dL
Hgb urine dipstick: NEGATIVE
KETONES UR: NEGATIVE mg/dL
Nitrite: NEGATIVE
PH: 5 (ref 5.0–8.0)
Protein, ur: NEGATIVE mg/dL
SPECIFIC GRAVITY, URINE: 1.006 (ref 1.005–1.030)
SQUAMOUS EPITHELIAL / LPF: NONE SEEN

## 2017-03-21 LAB — BASIC METABOLIC PANEL
Anion gap: 9 (ref 5–15)
BUN: 18 mg/dL (ref 6–20)
CALCIUM: 9 mg/dL (ref 8.9–10.3)
CHLORIDE: 104 mmol/L (ref 101–111)
CO2: 26 mmol/L (ref 22–32)
CREATININE: 1.17 mg/dL — AB (ref 0.44–1.00)
GFR calc non Af Amer: 44 mL/min — ABNORMAL LOW (ref >60)
GFR, EST AFRICAN AMERICAN: 51 mL/min — AB (ref >60)
Glucose, Bld: 112 mg/dL — ABNORMAL HIGH (ref 65–99)
Potassium: 4.4 mmol/L (ref 3.5–5.1)
SODIUM: 139 mmol/L (ref 135–145)

## 2017-03-21 LAB — I-STAT TROPONIN, ED: Troponin i, poc: 0 ng/mL (ref 0.00–0.08)

## 2017-03-21 MED ORDER — ACETAMINOPHEN 325 MG PO TABS
650.0000 mg | ORAL_TABLET | Freq: Four times a day (QID) | ORAL | Status: DC | PRN
  Administered 2017-03-21 – 2017-03-27 (×6): 650 mg via ORAL
  Filled 2017-03-21 (×6): qty 2

## 2017-03-21 MED ORDER — ENOXAPARIN SODIUM 40 MG/0.4ML ~~LOC~~ SOLN
40.0000 mg | SUBCUTANEOUS | Status: DC
  Administered 2017-03-21 – 2017-03-26 (×6): 40 mg via SUBCUTANEOUS
  Filled 2017-03-21 (×6): qty 0.4

## 2017-03-21 MED ORDER — AMLODIPINE BESYLATE 10 MG PO TABS
10.0000 mg | ORAL_TABLET | Freq: Every day | ORAL | Status: DC
  Administered 2017-03-22 – 2017-03-27 (×6): 10 mg via ORAL
  Filled 2017-03-21 (×7): qty 1

## 2017-03-21 MED ORDER — GABAPENTIN 300 MG PO CAPS
300.0000 mg | ORAL_CAPSULE | Freq: Two times a day (BID) | ORAL | Status: DC
  Administered 2017-03-22 – 2017-03-24 (×4): 300 mg via ORAL
  Filled 2017-03-21 (×5): qty 1

## 2017-03-21 MED ORDER — ADULT MULTIVITAMIN W/MINERALS CH
1.0000 | ORAL_TABLET | Freq: Every day | ORAL | Status: DC
  Administered 2017-03-22 – 2017-03-27 (×6): 1 via ORAL
  Filled 2017-03-21 (×6): qty 1

## 2017-03-21 MED ORDER — PANTOPRAZOLE SODIUM 40 MG PO TBEC
40.0000 mg | DELAYED_RELEASE_TABLET | Freq: Every day | ORAL | Status: DC
  Administered 2017-03-22 – 2017-03-27 (×6): 40 mg via ORAL
  Filled 2017-03-21 (×6): qty 1

## 2017-03-21 MED ORDER — VENLAFAXINE HCL ER 75 MG PO CP24
225.0000 mg | ORAL_CAPSULE | Freq: Every day | ORAL | Status: DC
  Administered 2017-03-22 – 2017-03-24 (×3): 225 mg via ORAL
  Filled 2017-03-21 (×3): qty 3

## 2017-03-21 MED ORDER — DORZOLAMIDE HCL-TIMOLOL MAL 2-0.5 % OP SOLN
2.0000 [drp] | Freq: Every day | OPHTHALMIC | Status: DC
  Administered 2017-03-22 – 2017-03-27 (×6): 2 [drp] via OPHTHALMIC
  Filled 2017-03-21: qty 10

## 2017-03-21 MED ORDER — SODIUM CHLORIDE 0.9 % IV BOLUS (SEPSIS)
1000.0000 mL | Freq: Once | INTRAVENOUS | Status: AC
  Administered 2017-03-21: 1000 mL via INTRAVENOUS

## 2017-03-21 MED ORDER — VENLAFAXINE HCL ER 75 MG PO CP24: 75.0000 mg | ORAL_CAPSULE | ORAL | Status: DC

## 2017-03-21 MED ORDER — ONDANSETRON HCL 4 MG/2ML IJ SOLN: 4.0000 mg | Freq: Four times a day (QID) | INTRAMUSCULAR | Status: DC | PRN

## 2017-03-21 MED ORDER — GABAPENTIN 300 MG PO CAPS
600.0000 mg | ORAL_CAPSULE | Freq: Every day | ORAL | Status: DC
  Administered 2017-03-21 – 2017-03-23 (×3): 600 mg via ORAL
  Filled 2017-03-21 (×3): qty 2

## 2017-03-21 MED ORDER — GADOBENATE DIMEGLUMINE 529 MG/ML IV SOLN
8.0000 mL | Freq: Once | INTRAVENOUS | Status: AC | PRN
  Administered 2017-03-21: 8 mL via INTRAVENOUS

## 2017-03-21 MED ORDER — POLYETHYLENE GLYCOL 3350 17 G PO PACK: 17.0000 g | PACK | Freq: Every day | ORAL | Status: DC | PRN

## 2017-03-21 MED ORDER — PRAVASTATIN SODIUM 20 MG PO TABS
20.0000 mg | ORAL_TABLET | Freq: Every day | ORAL | Status: DC
  Administered 2017-03-22 – 2017-03-26 (×5): 20 mg via ORAL
  Filled 2017-03-21 (×5): qty 1

## 2017-03-21 MED ORDER — ACETAMINOPHEN 650 MG RE SUPP: 650.0000 mg | Freq: Four times a day (QID) | RECTAL | Status: DC | PRN

## 2017-03-21 MED ORDER — LATANOPROST 0.005 % OP SOLN
1.0000 [drp] | Freq: Every day | OPHTHALMIC | Status: DC
  Administered 2017-03-21 – 2017-03-26 (×5): 1 [drp] via OPHTHALMIC
  Filled 2017-03-21: qty 2.5

## 2017-03-21 MED ORDER — SODIUM CHLORIDE 0.9% FLUSH
3.0000 mL | Freq: Two times a day (BID) | INTRAVENOUS | Status: DC
  Administered 2017-03-21 – 2017-03-25 (×5): 3 mL via INTRAVENOUS

## 2017-03-21 MED ORDER — ONDANSETRON HCL 4 MG PO TABS: 4.0000 mg | ORAL_TABLET | Freq: Four times a day (QID) | ORAL | Status: DC | PRN

## 2017-03-21 NOTE — Progress Notes (Signed)
I saw and examined this patient.  Discussed with Dr. Vanetta Shawl.  I agree with her management.  I will co sign the H&PE when available.  Briefly,  76 yo female with a fall secondary to syncope.  She has progressively worsening dizzy spells.  Describes them as more lightheadedness than as room spinning.  Interestingly, she also has worsening shaking spells.  Several times per day, while she is awake and conversant, she will have marked shaking of her hands and arms lasting 10-15 minutes.  Between spells, she has no tremor.  The shaking spells and lightheaded spells are not clearly related per patient - other than they have both been getting worse over the last 6-12 months. Yesterday, fully passed out and injured self.  We are working her up syncope.  Symptoms suggest orthostasis but BPs have been good.  I am concerned about seizures.  Also she has a marked elevated MCV in a non drinker.  I am not sure how B12 or folate deficiency or hypothyroidism would play in this picture.  We are in the data gathering stage.  Hopefully, specific dx will become clearer with testing and PT evalu.

## 2017-03-21 NOTE — Progress Notes (Signed)
New Admission Note:  Arrival Method: on stretcher from ER Mental Orientation: alert & oriented x 4  Telemetry:3w11 Assessment: Completed Skin: bruising on R shoulder, buttocks, arms. Moisture damage on buttocks medially.  IV: Pain:headache/ pt given tylenol.  Safety Measures: Safety Fall Prevention Plan was given, discussed. 4L93: Patient has been orientated to the room, unit and the staff. Family: at bedside  Orders have been reviewed and implemented. Will continue to monitor the patient. Call light has been placed within reach and bed alarm has been activated.   Arta Silence ,RN

## 2017-03-21 NOTE — ED Triage Notes (Signed)
Patient complains of dizziness x 2 months. Has had 2 syncopal events the past few days and now a mild headache. Alert and oriented. States that she gets weak and drops.

## 2017-03-21 NOTE — ED Provider Notes (Signed)
[facility] EMERGENCY DEPARTMENT Provider Note   CSN: [id number] Arrival date & time: 03/21/17  5885     History   Chief Complaint Chief Complaint  Patient presents with  . Loss of Consciousness    HPI Cassandra Brooks is a 76 y.o. female.  HPI 76 year old female with history of hypertension, AAA, who presents with multiple complaints.  Patient's primary complaint is that he has felt very off balance for 2 months.  She feels like the room is moving out from under her.  This is been ongoing for several months but seems to come and go in waves as well in which it is worsened.  Over the last week, she is also had occasional word finding and speech difficulties.  She is also had 2 episodes of syncope.  Both of these have occurred after she was going from sitting to standing, though one did occur after she had been standing for some time.  She had no preceding or subsequent chest pain.  No shortness of breath.  No abdominal pain.  No flank pain.  No lower extremity weakness or numbness.  No upper extremity numbness or weakness.  No loss of bowel or bladder function.  No seizure-like activity.  She currently feels off balance but otherwise is without complaints.   Past Medical History:  Diagnosis Date  . AAA (abdominal aortic aneurysm) ([place])   . Diabetes mellitus without complication ([place]) boderline  . Glaucoma   . Hypertension     Patient Active Problem List   Diagnosis Date Noted  . Unilateral primary osteoarthritis, left knee 02/26/2016  . Hemoptysis 03/18/2015  . Solitary pulmonary nodule 03/18/2015    Past Surgical History:  Procedure Laterality Date  . ABDOMINAL HYSTERECTOMY    . ABDOMINAL SURGERY    . VIDEO BRONCHOSCOPY Bilateral 03/19/2015   Procedure: VIDEO BRONCHOSCOPY WITHOUT FLUORO;  Surgeon: Rigoberto Noel, MD;  Location: [facility];  Service: Cardiopulmonary;  Laterality: Bilateral;    OB History    No data available       Home  Medications    Prior to Admission medications   Medication Sig Start Date End Date Taking? Authorizing Provider  amLODipine (NORVASC) 10 MG tablet Take 10 mg by mouth daily.   Yes [provider]  Calcium-Magnesium-Vitamin D (CALCIUM 1200+D3 PO) Take 1 tablet by mouth daily.   Yes [provider]  dorzolamide-timolol (COSOPT) 22.3-6.8 MG/ML ophthalmic solution Place 2 drops into both eyes daily.    Yes [provider]  gabapentin (NEURONTIN) 600 MG tablet Take 300-600 mg by mouth See admin instructions. 300 mg in the morning and at lunch and 600 mg at nignt   Yes [provider]  Multiple Vitamin (MULTIVITAMIN) tablet Take 1 tablet by mouth daily.   Yes [provider]  nabumetone (RELAFEN) 750 MG tablet Take 750 mg by mouth 2 (two) times daily. 02/21/17  Yes [provider]  omeprazole (PRILOSEC) 20 MG capsule Take 20 mg by mouth daily.   Yes [provider]  pravastatin (PRAVACHOL) 20 MG tablet Take 20 mg by mouth daily.   Yes [provider]  tiZANidine (ZANAFLEX) 4 MG tablet TAKE 1 TABLET(4 MG) BY MOUTH EVERY 6 HOURS AS NEEDED FOR MUSCLE SPASMS 12/06/16  Yes Leandrew Koyanagi, MD  Travoprost, BAK Free, (TRAVATAN) 0.004 % SOLN ophthalmic solution Place 1 drop into both eyes at bedtime.   Yes [provider]  Venlafaxine HCl 150 MG TB24 Take 150 mg by mouth  daily.    Yes [provider]    Family History No family history on file.  Social History Social History   Tobacco Use  . Smoking status: Never Smoker  . Smokeless tobacco: Never Used  Substance Use Topics  . Alcohol use: No  . Drug use: Not on file     Allergies   Patient has no known allergies.   Review of Systems Review of Systems  Constitutional: Positive for fatigue.  Neurological: Positive for dizziness, syncope and weakness.  All other systems reviewed and are negative.    Physical Exam Updated Vital Signs BP (!) 143/103    Pulse 78   Temp 97.7 F (36.5 C) (Oral)   Resp (!) 21   SpO2 92%   Physical Exam  Constitutional: She is oriented to person, place, and time. She appears well-developed and well-nourished. No distress.  HENT:  Head: Normocephalic and atraumatic.  Eyes: Conjunctivae are normal.  Neck: Neck supple.  Cardiovascular: Normal rate, regular rhythm and normal heart sounds. Exam reveals no friction rub.  No murmur heard. Pulmonary/Chest: Effort normal and breath sounds normal. No respiratory distress. She has no wheezes. She has no rales.  Abdominal: She exhibits no distension.  Musculoskeletal: She exhibits no edema.  Neurological: She is alert and oriented to person, place, and time. She exhibits normal muscle tone.  Skin: Skin is warm. Capillary refill takes less than 2 seconds.  Psychiatric: She has a normal mood and affect.  Nursing note and vitals reviewed.   Neurological Exam:  Mental Status: Alert and oriented to person, place, and time. Attention and concentration normal. Speech clear. Recent memory is intact. Cranial Nerves: Visual fields grossly intact. EOMI and PERRLA. No nystagmus noted. Facial sensation intact at forehead, maxillary cheek, and chin/mandible bilaterally. No facial asymmetry or weakness. Hearing grossly normal. Uvula is midline, and palate elevates symmetrically. Normal SCM and trapezius strength. Tongue midline without fasciculations. Motor: Muscle strength 5/5 in proximal and distal UE and LE bilaterally. No pronator drift. Muscle tone normal. Reflexes: 2+ and symmetrical in all four extremities.  Sensation: Intact to light touch in upper and lower extremities distally bilaterally.  Gait: Normal without ataxia. Coordination: Normal FTN bilaterally.     ED Treatments / Results  Labs (all labs ordered are listed, but only abnormal results are displayed) Labs Reviewed  BASIC METABOLIC PANEL - Abnormal; Notable for the following components:      Result Value     Glucose, Bld 112 (*)    Creatinine, Ser 1.17 (*)    GFR calc non Af Amer 44 (*)    GFR calc Af Amer 51 (*)    All other components within normal limits  CBC - Abnormal; Notable for the following components:   MCV 107.1 (*)    MCH 35.6 (*)    All other components within normal limits  URINALYSIS, ROUTINE W REFLEX MICROSCOPIC - Abnormal; Notable for the following components:   Leukocytes, UA TRACE (*)    Bacteria, UA RARE (*)    All other components within normal limits  CBG MONITORING, ED  I-STAT TROPONIN, ED    EKG  EKG Interpretation  Date/Time:  Tuesday March 21 2017 09:50:52 EST Ventricular Rate:  76 PR Interval:  140 QRS Duration: 64 QT Interval:  390 QTC Calculation: 438 R Axis:   61 Text Interpretation:  Normal sinus rhythm Nonspecific T wave abnormality Abnormal ECG No old tracing to compare Confirmed by Duffy Bruce [phone]) on 03/21/2017 11:06:17 AM  Radiology Dg Chest 2 View  Result Date: 03/21/2017 CLINICAL DATA:  Dizziness, fall EXAM: CHEST  2 VIEW COMPARISON:  Chest CT 10/07/2016 FINDINGS: Mild peribronchial thickening. Bibasilar atelectasis. Heart is normal size. No effusions or acute bony abnormality. IMPRESSION: Mild bronchitic changes, bibasilar atelectasis. Electronically Signed   By: Rolm Baptise M.D.   On: 03/21/2017 13:18   Ct Head Wo Contrast  Result Date: 03/21/2017 CLINICAL DATA:  76 year old female with multiple syncopal episodes and falls. Denies headache. Initial encounter. EXAM: CT HEAD WITHOUT CONTRAST TECHNIQUE: Contiguous axial images were obtained from the base of the skull through the vertex without intravenous contrast. COMPARISON:  None. FINDINGS: Brain: No intracranial hemorrhage or CT evidence of large acute infarct. Mild chronic microvascular changes. Mild global atrophy without hydrocephalus. No intracranial mass lesion noted on this unenhanced exam. Vascular: Vascular calcifications Skull: No skull fracture Sinuses/Orbits:  No acute orbital abnormality. Visualized paranasal sinuses are clear. Other: Mastoid air cells and middle ear cavities are clear. Temporomandibular joint degenerative changes. IMPRESSION: Normal for age unenhanced head CT. Electronically Signed   By: Genia Del M.D.   On: 03/21/2017 13:06   Dg Humerus Right  Result Date: 03/21/2017 CLINICAL DATA:  76 year old female with dizziness and fall today. Right side pain. EXAM: RIGHT HUMERUS - 2+ VIEW COMPARISON:  Chest CT 10/07/2016.  Chest radiographs 03/04/2015. FINDINGS: A small triangular ossific fragment at the right acromioclavicular joint is chronic and unchanged from the CT earlier this year. Grossly normal alignment at the right shoulder. Bone mineralization is within normal limits for age. The right humerus appears intact. Grossly normal alignment at the right elbow. Visible right ribs and lung parenchyma appear stable. IMPRESSION: No acute fracture or dislocation identified about the right humerus. Electronically Signed   By: Genevie Ann M.D.   On: 03/21/2017 13:20   Dg Hip Unilat W Or Wo Pelvis 2-3 Views Right  Result Date: 03/21/2017 CLINICAL DATA:  Dizziness, fall, right side pain EXAM: DG HIP (WITH OR WITHOUT PELVIS) 2-3V RIGHT COMPARISON:  None FINDINGS: Hip joints and SI joints are symmetric and unremarkable. No acute bony abnormality. Specifically, no fracture, subluxation, or dislocation. Soft tissues are intact. IMPRESSION: No acute bony abnormality. Electronically Signed   By: Rolm Baptise M.D.   On: 03/21/2017 13:20    Procedures Procedures (including critical care time)  Medications Ordered in ED Medications  sodium chloride 0.9 % bolus 1,000 mL (1,000 mLs Intravenous New Bag/Given 03/21/17 1236)     Initial Impression / Assessment and Plan / ED Course  I have reviewed the triage vital signs and the nursing notes.  Pertinent labs & imaging results that were available during my care of the patient were reviewed by me and  considered in my medical decision making (see chart for details).     76 year old female with past medical history as above here with multiple complaints.  Patient reports persistent ataxia with intermittent episodes of shaking, slurred speech, and also 2 episodes of syncope.  While most of these symptoms seem possibly orthostatic, she does not have orthostatic vital signs in the ED.  She also has some symptoms suggestive of possible posterior circulations of basilar artery/posterior circulation insufficiency is also on the differential.  CT head negative.  Lab work is largely unremarkable.  She has been given fluids and has persistent symptoms.  Discussed the case with neurology.  He feels that MR brain as well as MR angios the head and neck is reasonable, as well as admission for syncope  evaluation.  Final Clinical Impressions(s) / ED Diagnoses    Clinical Impression: 1. Syncope, unspecified syncope type   2. Dizziness     Disposition: Admit  This note was prepared with assistance of Dragon voice recognition software. Occasional wrong-word or sound-a-like substitutions may have occurred due to the inherent limitations of voice recognition software.      Duffy Bruce, MD 03/21/17 [phone]

## 2017-03-21 NOTE — H&P (Signed)
[facility] Admission History and Physical Service Pager: [phone]  Patient name: Cassandra Brooks Medical record number: [id number] Date of birth: [birth year] Age: 76 y.o. Gender: female  Primary Care Provider: Lowella Dandy, NP Consultants: neuro Code Status: full  Chief Complaint: syncope  Assessment and Plan: Cassandra Brooks is a 76 y.o. female presenting with dizziness and syncope. PMH is significant for HTN, glaucoma, GERD, HLD, OA, and anxiety.  Syncopal episodes- Per patient she has had at least 4 syncopal episodes over the past several days. Prior to this she has had a 2 month+ history of dizziness that has been worsening. She also endorses "shaking" episodes that occur frequently without LOC. Differential diagnosis includes vasovagal syncope, vertigo, posterior circulation insufficiency, possibly seizure activity, or psychogenic. Head Ct negative. Patient's orthostatic vitals negative in ED. Neuro exam largely normal except for focal left leg weakness, which is likely polio as child and possibly post-polio syndrome. No prodrome for syncopal episodes which is concerning. Patient did also endorse some palpitations. ED provider discussed with neurology who recommended MRI/MRA brain to r/o posterior circulation insufficiency as possible etiology which was ordered -place in observation, attending Dr. Andria Frames -vitals per floor -monitor on telemetry -MRI/MRA head and neck -syncopal work up: echo and am EKG -am CBC and BMP, check TSH -will obtain EEG to r/o seizure activity -PT/OT consult  Macrocytosis- seen on labs, MCV 108. Normal hemoglobin. Patient denies alcohol use. She has been on long term prilosec.  -check TSH, B12, folate.  HTN- hypertensive in ED, on amlodipine at home -continue norvasc -monitor BP  HLD- on pravachol 20 mg -continue home medication  Anxiety- on 225 mg effexor daily, anxiety possibly contributing to "shaking  episodes" -continue home med  Glaucoma- on cosopt eye drops -continue home eye drops  GERD- on prilosec daily -continue PPI  OA of spine, hips, knees- on nabumetone at home -hold NSAID due to mildly elevated Cr, restart as able -tylenol as needed for pain  FEN/GI: regular diet Prophylaxis: lovenox  Disposition: place in observation, monitor on telemetry  History of Present Illness:  Cassandra Brooks is a 76 y.o. female presenting with syncopal episodes.  Patient reports a several month history of dizziness that has been worsening including a "foggy" feeling in her head. She endorses some difficulty walking straight during the past few months. She also endorses episodes of shaking that will last several minutes and occur sometimes several times a day. She has discussed this with PCP who gave her anxiety medication. She has largely been ignoring the dizziness however over the past 3-4 days she has started passing out. She has had about 4 episodes of syncope that occur when standing or walking. She denies prodrome. She does admit occasional palpitations. Her family reports her speech will be slurred when she comes to as well as confusion as to how she ended up on the floor. During her last episode she hit her head fairly hard. She is otherwise quite active and [place]. She does not have a significant cardiac history. She has some worsening left leg weakness, she had polio as a child that has affected her left leg. No recent illnesses.   Review Of Systems: Per HPI with the following additions:  Review of Systems  Constitutional: Positive for chills. Negative for fever and weight loss.  HENT: Negative for congestion and sore throat.   Eyes: Negative for blurred vision, double vision and pain.  Respiratory: Negative for cough and shortness  of breath.   Cardiovascular: Positive for palpitations. Negative for chest pain and leg swelling.  Gastrointestinal: Negative for  abdominal pain, constipation, diarrhea, nausea and vomiting.  Genitourinary: Negative for dysuria, frequency and urgency.  Musculoskeletal: Positive for falls. Negative for myalgias.  Skin: Negative for rash.  Neurological: Positive for dizziness, speech change (after syncope), focal weakness (left leg), loss of consciousness and headaches. Negative for tremors, sensory change and weakness.  Endo/Heme/Allergies: Negative for environmental allergies.  Psychiatric/Behavioral: Negative for memory loss and substance abuse. The patient is nervous/anxious.     Patient Active Problem List   Diagnosis Date Noted  . Syncope 03/21/2017  . Unilateral primary osteoarthritis, left knee 02/26/2016  . Hemoptysis 03/18/2015  . Solitary pulmonary nodule 03/18/2015    Past Medical History: Past Medical History:  Diagnosis Date  . AAA (abdominal aortic aneurysm) ([place])   . Diabetes mellitus without complication ([place]) boderline  . Glaucoma   . Hypertension     Past Surgical History: Past Surgical History:  Procedure Laterality Date  . ABDOMINAL HYSTERECTOMY    . ABDOMINAL SURGERY    . VIDEO BRONCHOSCOPY Bilateral 03/19/2015   Procedure: VIDEO BRONCHOSCOPY WITHOUT FLUORO;  Surgeon: Rigoberto Noel, MD;  Location: [facility];  Service: Cardiopulmonary;  Laterality: Bilateral;    Social History: Social History   Tobacco Use  . Smoking status: Never Smoker  . Smokeless tobacco: Never Used  Substance Use Topics  . Alcohol use: No  . Drug use: Not on file   Additional social history: [place] farm  Please also refer to relevant sections of EMR.  Family History: No family history on file. Non-contributory. Living daughter in good health.  No FH of seizures.  Allergies and Medications: No Known Allergies No current facility-administered medications on file prior to encounter.    Current Outpatient Medications on File Prior to Encounter  Medication Sig Dispense Refill  .  amLODipine (NORVASC) 10 MG tablet Take 10 mg by mouth daily.    . Calcium-Magnesium-Vitamin D (CALCIUM 1200+D3 PO) Take 1 tablet by mouth daily.    . dorzolamide-timolol (COSOPT) 22.3-6.8 MG/ML ophthalmic solution Place 2 drops into both eyes daily.     Marland Kitchen gabapentin (NEURONTIN) 600 MG tablet Take 300-600 mg by mouth See admin instructions. 300 mg in the morning and at lunch and 600 mg at nignt    . Multiple Vitamin (MULTIVITAMIN) tablet Take 1 tablet by mouth daily.    . nabumetone (RELAFEN) 750 MG tablet Take 750 mg by mouth 2 (two) times daily.  4  . omeprazole (PRILOSEC) 20 MG capsule Take 20 mg by mouth daily.    . pravastatin (PRAVACHOL) 20 MG tablet Take 20 mg by mouth daily.    Marland Kitchen tiZANidine (ZANAFLEX) 4 MG tablet TAKE 1 TABLET(4 MG) BY MOUTH EVERY 6 HOURS AS NEEDED FOR MUSCLE SPASMS 385 tablet 2  . Travoprost, BAK Free, (TRAVATAN) 0.004 % SOLN ophthalmic solution Place 1 drop into both eyes at bedtime.    Marland Kitchen venlafaxine XR (EFFEXOR-XR) 150 MG 24 hr capsule Take 150 mg by mouth See admin instructions. Take 75 mg and 150 mg=225 mg daily    . venlafaxine XR (EFFEXOR-XR) 75 MG 24 hr capsule Take 75 mg by mouth See admin instructions. Patient takes 150 mg and 75 mg for a total of 225 mg      Objective: BP (!) 173/86 (BP Location: Right Arm)   Pulse 81   Temp (!) 97.5 F (36.4 C) (Axillary)   Resp  12   Ht 5\' 2"  (1.575 m)   Wt 172 lb 2.9 oz (78.1 kg)   SpO2 94%   BMI 31.49 kg/m  Exam: General: pleasant elderly lady laying in bed in NAD Eyes: EOMI, PERRLA ENTM: dry tongue, symmetrical palate rise, no erythema in posterior oropharynx Neck: supple, non-tender, no LAD or thyromegaly appreciated Cardiovascular: RRR no MRG, palpable distal pulses Respiratory: CTAB. No increased work of breathing Gastrointestinal: soft, non-tender, non-distended, +BS. Midline incisional scar MSK: moves all extremities equally, trace edema in lower extremities bilaterally  Derm: warm, dry. Scattered  ecchymoses on back, arms Neuro: CN2-12 in tact. 5/5 strength in upper extremities bilaterally and right leg. 3/5 strength in left leg. Sensation in tact throughout. Normal finger to nose bilaterally. Negative pronator drift.  Psych: AOx3, appropriate mood and affect  Labs and Imaging: CBC BMET  Recent Labs  Lab 03/21/17 0952  WBC 4.0  HGB 14.6  HCT 43.9  PLT 210   Recent Labs  Lab 03/21/17 0952  NA 139  K 4.4  CL 104  CO2 26  BUN 18  CREATININE 1.17*  GLUCOSE 112*  CALCIUM 9.0     Dg Chest 2 View  Result Date: 03/21/2017 CLINICAL DATA:  Dizziness, fall EXAM: CHEST  2 VIEW COMPARISON:  Chest CT 10/07/2016 FINDINGS: Mild peribronchial thickening. Bibasilar atelectasis. Heart is normal size. No effusions or acute bony abnormality. IMPRESSION: Mild bronchitic changes, bibasilar atelectasis. Electronically Signed   By: Rolm Baptise M.D.   On: 03/21/2017 13:18   Ct Head Wo Contrast  Result Date: 03/21/2017 CLINICAL DATA:  76 year old female with multiple syncopal episodes and falls. Denies headache. Initial encounter. EXAM: CT HEAD WITHOUT CONTRAST TECHNIQUE: Contiguous axial images were obtained from the base of the skull through the vertex without intravenous contrast. COMPARISON:  None. FINDINGS: Brain: No intracranial hemorrhage or CT evidence of large acute infarct. Mild chronic microvascular changes. Mild global atrophy without hydrocephalus. No intracranial mass lesion noted on this unenhanced exam. Vascular: Vascular calcifications Skull: No skull fracture Sinuses/Orbits: No acute orbital abnormality. Visualized paranasal sinuses are clear. Other: Mastoid air cells and middle ear cavities are clear. Temporomandibular joint degenerative changes. IMPRESSION: Normal for age unenhanced head CT. Electronically Signed   By: Genia Del M.D.   On: 03/21/2017 13:06   Dg Humerus Right  Result Date: 03/21/2017 CLINICAL DATA:  76 year old female with dizziness and fall today. Right  side pain. EXAM: RIGHT HUMERUS - 2+ VIEW COMPARISON:  Chest CT 10/07/2016.  Chest radiographs 03/04/2015. FINDINGS: A small triangular ossific fragment at the right acromioclavicular joint is chronic and unchanged from the CT earlier this year. Grossly normal alignment at the right shoulder. Bone mineralization is within normal limits for age. The right humerus appears intact. Grossly normal alignment at the right elbow. Visible right ribs and lung parenchyma appear stable. IMPRESSION: No acute fracture or dislocation identified about the right humerus. Electronically Signed   By: Genevie Ann M.D.   On: 03/21/2017 13:20   Dg Hip Unilat W Or Wo Pelvis 2-3 Views Right  Result Date: 03/21/2017 CLINICAL DATA:  Dizziness, fall, right side pain EXAM: DG HIP (WITH OR WITHOUT PELVIS) 2-3V RIGHT COMPARISON:  None FINDINGS: Hip joints and SI joints are symmetric and unremarkable. No acute bony abnormality. Specifically, no fracture, subluxation, or dislocation. Soft tissues are intact. IMPRESSION: No acute bony abnormality. Electronically Signed   By: Rolm Baptise M.D.   On: 03/21/2017 13:20     Steve Rattler, DO 03/21/2017, 5:05  PM PGY-2, [place] [facility] pager: [phone], text pages welcome

## 2017-03-21 NOTE — Progress Notes (Signed)
Contacted [employer] for continuous pulseox monitor.  Will check to see if any are available.

## 2017-03-21 NOTE — ED Notes (Signed)
Patient c/o dizziness x 2 months states she gets dizzy every time she stands up. She gets dizzy has fallen multiple times. Bruising to right posterior elbow and right posterior shoulder. States she fell into the wall last night hitting the left side of her head. No loc.

## 2017-03-21 NOTE — ED Notes (Signed)
Patient c/o being dizzy when sitting or standing. States any change in movement she becomes dizzy.

## 2017-03-22 ENCOUNTER — Observation Stay (HOSPITAL_BASED_OUTPATIENT_CLINIC_OR_DEPARTMENT_OTHER): Payer: Medicare Other

## 2017-03-22 ENCOUNTER — Observation Stay (HOSPITAL_COMMUNITY): Payer: Medicare Other

## 2017-03-22 DIAGNOSIS — R911 Solitary pulmonary nodule: Secondary | ICD-10-CM | POA: Diagnosis not present

## 2017-03-22 DIAGNOSIS — E785 Hyperlipidemia, unspecified: Secondary | ICD-10-CM | POA: Diagnosis present

## 2017-03-22 DIAGNOSIS — R042 Hemoptysis: Secondary | ICD-10-CM | POA: Diagnosis not present

## 2017-03-22 DIAGNOSIS — R55 Syncope and collapse: Principal | ICD-10-CM

## 2017-03-22 DIAGNOSIS — R002 Palpitations: Secondary | ICD-10-CM | POA: Diagnosis present

## 2017-03-22 DIAGNOSIS — M170 Bilateral primary osteoarthritis of knee: Secondary | ICD-10-CM | POA: Diagnosis present

## 2017-03-22 DIAGNOSIS — M797 Fibromyalgia: Secondary | ICD-10-CM | POA: Diagnosis present

## 2017-03-22 DIAGNOSIS — R278 Other lack of coordination: Secondary | ICD-10-CM | POA: Diagnosis not present

## 2017-03-22 DIAGNOSIS — H409 Unspecified glaucoma: Secondary | ICD-10-CM | POA: Diagnosis present

## 2017-03-22 DIAGNOSIS — M4802 Spinal stenosis, cervical region: Secondary | ICD-10-CM | POA: Diagnosis present

## 2017-03-22 DIAGNOSIS — R42 Dizziness and giddiness: Secondary | ICD-10-CM | POA: Diagnosis not present

## 2017-03-22 DIAGNOSIS — M479 Spondylosis, unspecified: Secondary | ICD-10-CM | POA: Diagnosis present

## 2017-03-22 DIAGNOSIS — R402413 Glasgow coma scale score 13-15, at hospital admission: Secondary | ICD-10-CM | POA: Diagnosis present

## 2017-03-22 DIAGNOSIS — M160 Bilateral primary osteoarthritis of hip: Secondary | ICD-10-CM | POA: Diagnosis present

## 2017-03-22 DIAGNOSIS — R2689 Other abnormalities of gait and mobility: Secondary | ICD-10-CM | POA: Diagnosis not present

## 2017-03-22 DIAGNOSIS — I081 Rheumatic disorders of both mitral and tricuspid valves: Secondary | ICD-10-CM | POA: Diagnosis present

## 2017-03-22 DIAGNOSIS — Z9181 History of falling: Secondary | ICD-10-CM | POA: Diagnosis not present

## 2017-03-22 DIAGNOSIS — M169 Osteoarthritis of hip, unspecified: Secondary | ICD-10-CM | POA: Diagnosis not present

## 2017-03-22 DIAGNOSIS — I361 Nonrheumatic tricuspid (valve) insufficiency: Secondary | ICD-10-CM | POA: Diagnosis not present

## 2017-03-22 DIAGNOSIS — F419 Anxiety disorder, unspecified: Secondary | ICD-10-CM | POA: Diagnosis present

## 2017-03-22 DIAGNOSIS — I714 Abdominal aortic aneurysm, without rupture: Secondary | ICD-10-CM | POA: Diagnosis present

## 2017-03-22 DIAGNOSIS — Z8679 Personal history of other diseases of the circulatory system: Secondary | ICD-10-CM | POA: Diagnosis not present

## 2017-03-22 DIAGNOSIS — K219 Gastro-esophageal reflux disease without esophagitis: Secondary | ICD-10-CM | POA: Diagnosis present

## 2017-03-22 DIAGNOSIS — R251 Tremor, unspecified: Secondary | ICD-10-CM | POA: Diagnosis present

## 2017-03-22 DIAGNOSIS — R296 Repeated falls: Secondary | ICD-10-CM | POA: Diagnosis present

## 2017-03-22 DIAGNOSIS — M6281 Muscle weakness (generalized): Secondary | ICD-10-CM | POA: Diagnosis not present

## 2017-03-22 DIAGNOSIS — R29702 NIHSS score 2: Secondary | ICD-10-CM | POA: Diagnosis present

## 2017-03-22 DIAGNOSIS — M1712 Unilateral primary osteoarthritis, left knee: Secondary | ICD-10-CM | POA: Diagnosis not present

## 2017-03-22 DIAGNOSIS — G14 Postpolio syndrome: Secondary | ICD-10-CM | POA: Diagnosis present

## 2017-03-22 DIAGNOSIS — R2681 Unsteadiness on feet: Secondary | ICD-10-CM | POA: Diagnosis not present

## 2017-03-22 DIAGNOSIS — E119 Type 2 diabetes mellitus without complications: Secondary | ICD-10-CM | POA: Diagnosis present

## 2017-03-22 DIAGNOSIS — D7589 Other specified diseases of blood and blood-forming organs: Secondary | ICD-10-CM | POA: Diagnosis present

## 2017-03-22 DIAGNOSIS — I519 Heart disease, unspecified: Secondary | ICD-10-CM | POA: Diagnosis present

## 2017-03-22 DIAGNOSIS — I119 Hypertensive heart disease without heart failure: Secondary | ICD-10-CM | POA: Diagnosis present

## 2017-03-22 LAB — CBC
HCT: 41.9 % (ref 36.0–46.0)
HEMOGLOBIN: 13.7 g/dL (ref 12.0–15.0)
MCH: 34.3 pg — AB (ref 26.0–34.0)
MCHC: 32.7 g/dL (ref 30.0–36.0)
MCV: 105 fL — ABNORMAL HIGH (ref 78.0–100.0)
PLATELETS: 186 10*3/uL (ref 150–400)
RBC: 3.99 MIL/uL (ref 3.87–5.11)
RDW: 13.2 % (ref 11.5–15.5)
WBC: 4.5 10*3/uL (ref 4.0–10.5)

## 2017-03-22 LAB — BASIC METABOLIC PANEL
Anion gap: 11 (ref 5–15)
BUN: 15 mg/dL (ref 6–20)
CALCIUM: 8.5 mg/dL — AB (ref 8.9–10.3)
CHLORIDE: 106 mmol/L (ref 101–111)
CO2: 23 mmol/L (ref 22–32)
CREATININE: 0.85 mg/dL (ref 0.44–1.00)
GFR calc non Af Amer: >60 mL/min (ref >60)
Glucose, Bld: 101 mg/dL — ABNORMAL HIGH (ref 65–99)
POTASSIUM: 3.6 mmol/L (ref 3.5–5.1)
SODIUM: 140 mmol/L (ref 135–145)

## 2017-03-22 LAB — ECHOCARDIOGRAM COMPLETE
HEIGHTINCHES: 62 in
WEIGHTICAEL: 2754.87 [oz_av]

## 2017-03-22 LAB — TSH: TSH: 1.677 u[IU]/mL (ref 0.350–4.500)

## 2017-03-22 LAB — TROPONIN I: Troponin I: <0.03 ng/mL (ref <0.03)

## 2017-03-22 LAB — HEMOGLOBIN A1C
HEMOGLOBIN A1C: 5.5 % (ref 4.8–5.6)
Mean Plasma Glucose: 111.15 mg/dL

## 2017-03-22 LAB — GLUCOSE, CAPILLARY: Glucose-Capillary: 104 mg/dL — ABNORMAL HIGH (ref 65–99)

## 2017-03-22 LAB — VITAMIN B12: VITAMIN B 12: 342 pg/mL (ref 180–914)

## 2017-03-22 MED ORDER — HYDROCHLOROTHIAZIDE 12.5 MG PO CAPS
12.5000 mg | ORAL_CAPSULE | Freq: Every day | ORAL | Status: DC
  Administered 2017-03-22 – 2017-03-27 (×6): 12.5 mg via ORAL
  Filled 2017-03-22 (×6): qty 1

## 2017-03-22 NOTE — Progress Notes (Signed)
EEG complete - results pending 

## 2017-03-22 NOTE — Discharge Summary (Signed)
[facility] Discharge Summary  Patient name: Cassandra Brooks Medical record number: [id number] Date of birth: 1940-08-27 Age: 76 y.o. Gender: female Date of Admission: 03/21/2017  Date of Discharge: 03/27/2017 Admitting Physician: Zenia Resides, MD  Primary Care Provider: Lowella Dandy, NP Consultants: neuro   Indication for Hospitalization: syncope   Discharge Diagnoses/Problem List:  Syncopal episodes Macrocytosis HTN HLD Anxiety Glaucoma GERD OA of spine, hips, knees   Disposition: home with home health   Discharge Condition: stable   Discharge Exam:  Please see progress note from date of discharge   Brief Hospital Course:  Cassandra Brooks is a 76 y.o. female presenting for syncopal episodes. Patient also had episodes of "shaking" prior to admission. EEG did not show epilepptiform activity. CT head was negative.  Neurology was consulted and recommended orthostatic vitals as 3/5/10min delayed standing vitals. Orthostatics were normal. MRI of neck showed spinal stenosis in C5-C6 and C6-C7. Moderate/severe right C5 and bilateral C6 nerve level foraminal stenosis seen on MRI. Cardiology recommendations for outpatient follow up and possible event monitor.   During admission labs patient found to have macrocytosis, MCV of 105.2 on discharge. TSH wnl and B12 wnl. Folate wnl.   During admission patient's BP elevated. BP began to be controlled with  Home norvasc and added HCTZ.   Issues for Follow Up:  1. Neuro recs: continued PT and outpatient f/u with Dr. Carles Collet (neurology)  2. Cardiology recs: decrease venlafaxine dose and outpatient vestibular PT 3. Consider further work up of macrocytosis  4. Monitor BP. Started on HCTZ 12.5 mg daily and continued on norvasc 10mg   5. Recommend decreased venlafaxine dose. Also recommended by cardiology to decrease dose  6. Will need outpatient cardiology follow up 7. Continued PT and vestibular PT  8. Have  decreased both venlafaxine and gabapentin  Significant Procedures: none  Significant Labs and Imaging:  Recent Labs  Lab 03/25/17 0254 03/26/17 0443 03/27/17 0456  WBC 5.4 4.6 4.4  HGB 14.6 14.9 14.5  HCT 44.2 44.9 43.5  PLT 199 195 199   Recent Labs  Lab 03/23/17 0453 03/24/17 0411 03/25/17 0254 03/26/17 0443 03/26/17 1211 03/27/17 0456  NA 140 139 138 139  --  139  K 3.7 3.6 3.4* 3.5  --  3.4*  CL 106 104 102 101  --  103  CO2 24 26 27 27   --  25  GLUCOSE 114* 112* 121* 113*  --  110*  BUN 15 19 20  23*  --  23*  CREATININE 0.86 0.93 1.06* 0.97  --  0.88  CALCIUM 8.8* 9.0 9.0 8.9  --  9.0  ALKPHOS  --   --   --   --  67  --   AST  --   --   --   --  45*  --   ALT  --   --   --   --  42  --   ALBUMIN  --   --   --   --  3.3*  --      Ref. Range 03/22/2017 12:01 03/22/2017 14:18 03/22/2017 16:41 03/22/2017 18:05 03/22/2017 22:39  Troponin I Latest Ref Range: <0.03 ng/mL <0.03  <0.03  <0.03     Ref. Range 03/22/2017 18:05  Hemoglobin A1C Latest Ref Range: 4.8 - 5.6 % 5.5     Ref. Range 03/22/2017 03:36  Folate, Hemolysate Latest Ref Range: Not Estab. ng/mL >620.0  Folate, RBC Latest Ref Range: >498 ng/mL >1,466  Ref. Range 03/22/2017 03:36  Vitamin B12 Latest Ref Range: 180 - 914 pg/mL 342     Ref. Range 03/22/2017 03:36  TSH Latest Ref Range: 0.350 - 4.500 uIU/mL 1.677    Ref. Range 03/26/2017 12:11  Alkaline Phosphatase Latest Ref Range: 38 - 126 U/L 67  Albumin Latest Ref Range: 3.5 - 5.0 g/dL 3.3 (L)  AST Latest Ref Range: 15 - 41 U/L 45 (H)  ALT Latest Ref Range: 14 - 54 U/L 42  Total Protein Latest Ref Range: 6.5 - 8.1 g/dL 6.7  Bilirubin, Direct Latest Ref Range: 0.1 - 0.5 mg/dL <0.1 (L)  Indirect Bilirubin Latest Ref Range: 0.3 - 0.9 mg/dL NOT CALCULATED  Total Bilirubin Latest Ref Range: 0.3 - 1.2 mg/dL 0.4    Ref. Range 03/26/2017 12:11  RPR Latest Ref Range: Non Reactive  Non Reactive   Dg Chest 2 View  Result Date:  03/21/2017 CLINICAL DATA:  Dizziness, fall EXAM: CHEST  2 VIEW COMPARISON:  Chest CT 10/07/2016 FINDINGS: Mild peribronchial thickening. Bibasilar atelectasis. Heart is normal size. No effusions or acute bony abnormality. IMPRESSION: Mild bronchitic changes, bibasilar atelectasis. Electronically Signed   By: Rolm Baptise M.D.   On: 03/21/2017 13:18   Ct Head Wo Contrast  Result Date: 03/21/2017 CLINICAL DATA:  76 year old female with multiple syncopal episodes and falls. Denies headache. Initial encounter. EXAM: CT HEAD WITHOUT CONTRAST TECHNIQUE: Contiguous axial images were obtained from the base of the skull through the vertex without intravenous contrast. COMPARISON:  None. FINDINGS: Brain: No intracranial hemorrhage or CT evidence of large acute infarct. Mild chronic microvascular changes. Mild global atrophy without hydrocephalus. No intracranial mass lesion noted on this unenhanced exam. Vascular: Vascular calcifications Skull: No skull fracture Sinuses/Orbits: No acute orbital abnormality. Visualized paranasal sinuses are clear. Other: Mastoid air cells and middle ear cavities are clear. Temporomandibular joint degenerative changes. IMPRESSION: Normal for age unenhanced head CT. Electronically Signed   By: Genia Del M.D.   On: 03/21/2017 13:06   Mr Angiogram Head W Or Wo Contrast  Result Date: 03/21/2017 CLINICAL DATA:  Initial evaluation for acute dizziness, syncope, ataxia, stroke suspected. EXAM: MRI HEAD WITHOUT AND WITH CONTRAST MRA HEAD WITHOUT CONTRAST MRA NECK WITHOUT AND WITH CONTRAST TECHNIQUE: Multiplanar, multiecho pulse sequences of the brain and surrounding structures were obtained without intravenous contrast. Angiographic images of the Circle of Willis were obtained using MRA technique without intravenous contrast. Angiographic images of the neck were obtained using MRA technique without and with intravenous contrast. Carotid stenosis measurements (when applicable) are obtained  utilizing NASCET criteria, using the distal internal carotid diameter as the denominator. CONTRAST:  60mL MULTIHANCE GADOBENATE DIMEGLUMINE 529 MG/ML IV SOLN COMPARISON:  None. FINDINGS: MRI HEAD FINDINGS Mild diffuse prominence of the CSF containing spaces is compatible with generalized age related cerebral atrophy. Patchy and confluent T2/FLAIR hyperintensity within the periventricular and deep white matter both cerebral hemispheres most consistent with chronic small vessel ischemic disease, overall moderate nature. Chronic microvascular ischemic changes present within the pons. No abnormal foci of restricted diffusion to suggest acute or subacute ischemia. Gray-white matter differentiation maintained. No foci of susceptibility artifact to suggest acute or chronic intracranial hemorrhage. No encephalomalacia to suggest remote cortical infarction. No mass lesion, midline shift or mass effect. No hydrocephalus. No extra-axial fluid collection. Major dural sinuses are grossly patent. No abnormal enhancement. Pituitary gland suprasellar region normal. Midline structures intact and normal. Major intracranial vascular flow voids are maintained. Craniocervical junction within normal limits. Upper cervical spine unremarkable.  Bone marrow signal intensity within normal limits. No scalp soft tissue abnormality. Globes and oval soft tissues within normal limits. Paranasal sinuses are clear. No mastoid effusion. Inner ear structures normal. MRA HEAD FINDINGS ANTERIOR CIRCULATION: Distal cervical segments of the internal carotid arteries are widely patent with antegrade flow. Petrous, cavernous, and supraclinoid segments widely patent without stenosis. ICA termini widely patent. A1 segments patent bilaterally. Right A1 segment hypoplastic, which likely accounts for the slightly diminutive right ICA is compared to the left. Normal anterior communicating artery. Anterior cerebral arteries widely patent to their distal aspects  without stenosis. M1 segments patent without stenosis or occlusion. Normal MCA bifurcations. No proximal M2 occlusion. Distal MCA branches well perfused and symmetric. POSTERIOR CIRCULATION: Vertebral arteries patent to the vertebrobasilar junction without stenosis. Left vertebral artery dominant. Posterior inferior cerebral arteries patent proximally. Basilar artery widely patent to its distal aspect. Superior cerebral arteries patent bilaterally. Both of the posterior cerebral arteries supplied via the basilar and are well perfused to their distal aspects without stenosis. No aneurysm or vascular malformation. MRA NECK FINDINGS Source images reviewed. Visualized aortic arch of normal caliber with normal branch pattern. No flow-limiting stenosis about the origin of the great vessels. Partially visualized subclavian artery is widely patent without stenosis. Right common and internal carotid arteries widely patent without stenosis or occlusion. No significant atheromatous narrowing about the right carotid bifurcation. Left common and internal carotid artery is widely patent without stenosis or occlusion. No significant atheromatous narrowing about the left carotid bifurcation. Both of the vertebral arteries arise from the subclavian arteries. Left vertebral artery dominant. Right vertebral artery diffusely diminutive/hypoplastic. Vertebral arteries patent within the neck without stenosis or occlusion. IMPRESSION: MRI HEAD IMPRESSION: 1. No acute intracranial infarct or other abnormality identified. 2. Generalized age-related cerebral atrophy with moderate chronic small vessel ischemic disease. MRA HEAD IMPRESSION: Normal intracranial MRA. No evidence for large vessel occlusion. No high-grade or correctable stenosis. MRA NECK IMPRESSION: Normal MRA of the neck. No high-grade or critical flow limiting stenosis identified. Electronically Signed   By: Jeannine Boga M.D.   On: 03/21/2017 21:20   Mr Angiogram Neck  W Or Wo Contrast  Result Date: 03/21/2017 CLINICAL DATA:  Initial evaluation for acute dizziness, syncope, ataxia, stroke suspected. EXAM: MRI HEAD WITHOUT AND WITH CONTRAST MRA HEAD WITHOUT CONTRAST MRA NECK WITHOUT AND WITH CONTRAST TECHNIQUE: Multiplanar, multiecho pulse sequences of the brain and surrounding structures were obtained without intravenous contrast. Angiographic images of the Circle of Willis were obtained using MRA technique without intravenous contrast. Angiographic images of the neck were obtained using MRA technique without and with intravenous contrast. Carotid stenosis measurements (when applicable) are obtained utilizing NASCET criteria, using the distal internal carotid diameter as the denominator. CONTRAST:  66mL MULTIHANCE GADOBENATE DIMEGLUMINE 529 MG/ML IV SOLN COMPARISON:  None. FINDINGS: MRI HEAD FINDINGS Mild diffuse prominence of the CSF containing spaces is compatible with generalized age related cerebral atrophy. Patchy and confluent T2/FLAIR hyperintensity within the periventricular and deep white matter both cerebral hemispheres most consistent with chronic small vessel ischemic disease, overall moderate nature. Chronic microvascular ischemic changes present within the pons. No abnormal foci of restricted diffusion to suggest acute or subacute ischemia. Gray-white matter differentiation maintained. No foci of susceptibility artifact to suggest acute or chronic intracranial hemorrhage. No encephalomalacia to suggest remote cortical infarction. No mass lesion, midline shift or mass effect. No hydrocephalus. No extra-axial fluid collection. Major dural sinuses are grossly patent. No abnormal enhancement. Pituitary gland suprasellar region normal. Midline structures  intact and normal. Major intracranial vascular flow voids are maintained. Craniocervical junction within normal limits. Upper cervical spine unremarkable. Bone marrow signal intensity within normal limits. No scalp soft  tissue abnormality. Globes and oval soft tissues within normal limits. Paranasal sinuses are clear. No mastoid effusion. Inner ear structures normal. MRA HEAD FINDINGS ANTERIOR CIRCULATION: Distal cervical segments of the internal carotid arteries are widely patent with antegrade flow. Petrous, cavernous, and supraclinoid segments widely patent without stenosis. ICA termini widely patent. A1 segments patent bilaterally. Right A1 segment hypoplastic, which likely accounts for the slightly diminutive right ICA is compared to the left. Normal anterior communicating artery. Anterior cerebral arteries widely patent to their distal aspects without stenosis. M1 segments patent without stenosis or occlusion. Normal MCA bifurcations. No proximal M2 occlusion. Distal MCA branches well perfused and symmetric. POSTERIOR CIRCULATION: Vertebral arteries patent to the vertebrobasilar junction without stenosis. Left vertebral artery dominant. Posterior inferior cerebral arteries patent proximally. Basilar artery widely patent to its distal aspect. Superior cerebral arteries patent bilaterally. Both of the posterior cerebral arteries supplied via the basilar and are well perfused to their distal aspects without stenosis. No aneurysm or vascular malformation. MRA NECK FINDINGS Source images reviewed. Visualized aortic arch of normal caliber with normal branch pattern. No flow-limiting stenosis about the origin of the great vessels. Partially visualized subclavian artery is widely patent without stenosis. Right common and internal carotid arteries widely patent without stenosis or occlusion. No significant atheromatous narrowing about the right carotid bifurcation. Left common and internal carotid artery is widely patent without stenosis or occlusion. No significant atheromatous narrowing about the left carotid bifurcation. Both of the vertebral arteries arise from the subclavian arteries. Left vertebral artery dominant. Right  vertebral artery diffusely diminutive/hypoplastic. Vertebral arteries patent within the neck without stenosis or occlusion. IMPRESSION: MRI HEAD IMPRESSION: 1. No acute intracranial infarct or other abnormality identified. 2. Generalized age-related cerebral atrophy with moderate chronic small vessel ischemic disease. MRA HEAD IMPRESSION: Normal intracranial MRA. No evidence for large vessel occlusion. No high-grade or correctable stenosis. MRA NECK IMPRESSION: Normal MRA of the neck. No high-grade or critical flow limiting stenosis identified. Electronically Signed   By: Jeannine Boga M.D.   On: 03/21/2017 21:20   Mr Jeri Cos And Wo Contrast  Result Date: 03/21/2017 CLINICAL DATA:  Initial evaluation for acute dizziness, syncope, ataxia, stroke suspected. EXAM: MRI HEAD WITHOUT AND WITH CONTRAST MRA HEAD WITHOUT CONTRAST MRA NECK WITHOUT AND WITH CONTRAST TECHNIQUE: Multiplanar, multiecho pulse sequences of the brain and surrounding structures were obtained without intravenous contrast. Angiographic images of the Circle of Willis were obtained using MRA technique without intravenous contrast. Angiographic images of the neck were obtained using MRA technique without and with intravenous contrast. Carotid stenosis measurements (when applicable) are obtained utilizing NASCET criteria, using the distal internal carotid diameter as the denominator. CONTRAST:  71mL MULTIHANCE GADOBENATE DIMEGLUMINE 529 MG/ML IV SOLN COMPARISON:  None. FINDINGS: MRI HEAD FINDINGS Mild diffuse prominence of the CSF containing spaces is compatible with generalized age related cerebral atrophy. Patchy and confluent T2/FLAIR hyperintensity within the periventricular and deep white matter both cerebral hemispheres most consistent with chronic small vessel ischemic disease, overall moderate nature. Chronic microvascular ischemic changes present within the pons. No abnormal foci of restricted diffusion to suggest acute or subacute  ischemia. Gray-white matter differentiation maintained. No foci of susceptibility artifact to suggest acute or chronic intracranial hemorrhage. No encephalomalacia to suggest remote cortical infarction. No mass lesion, midline shift or mass effect. No hydrocephalus. No  extra-axial fluid collection. Major dural sinuses are grossly patent. No abnormal enhancement. Pituitary gland suprasellar region normal. Midline structures intact and normal. Major intracranial vascular flow voids are maintained. Craniocervical junction within normal limits. Upper cervical spine unremarkable. Bone marrow signal intensity within normal limits. No scalp soft tissue abnormality. Globes and oval soft tissues within normal limits. Paranasal sinuses are clear. No mastoid effusion. Inner ear structures normal. MRA HEAD FINDINGS ANTERIOR CIRCULATION: Distal cervical segments of the internal carotid arteries are widely patent with antegrade flow. Petrous, cavernous, and supraclinoid segments widely patent without stenosis. ICA termini widely patent. A1 segments patent bilaterally. Right A1 segment hypoplastic, which likely accounts for the slightly diminutive right ICA is compared to the left. Normal anterior communicating artery. Anterior cerebral arteries widely patent to their distal aspects without stenosis. M1 segments patent without stenosis or occlusion. Normal MCA bifurcations. No proximal M2 occlusion. Distal MCA branches well perfused and symmetric. POSTERIOR CIRCULATION: Vertebral arteries patent to the vertebrobasilar junction without stenosis. Left vertebral artery dominant. Posterior inferior cerebral arteries patent proximally. Basilar artery widely patent to its distal aspect. Superior cerebral arteries patent bilaterally. Both of the posterior cerebral arteries supplied via the basilar and are well perfused to their distal aspects without stenosis. No aneurysm or vascular malformation. MRA NECK FINDINGS Source images  reviewed. Visualized aortic arch of normal caliber with normal branch pattern. No flow-limiting stenosis about the origin of the great vessels. Partially visualized subclavian artery is widely patent without stenosis. Right common and internal carotid arteries widely patent without stenosis or occlusion. No significant atheromatous narrowing about the right carotid bifurcation. Left common and internal carotid artery is widely patent without stenosis or occlusion. No significant atheromatous narrowing about the left carotid bifurcation. Both of the vertebral arteries arise from the subclavian arteries. Left vertebral artery dominant. Right vertebral artery diffusely diminutive/hypoplastic. Vertebral arteries patent within the neck without stenosis or occlusion. IMPRESSION: MRI HEAD IMPRESSION: 1. No acute intracranial infarct or other abnormality identified. 2. Generalized age-related cerebral atrophy with moderate chronic small vessel ischemic disease. MRA HEAD IMPRESSION: Normal intracranial MRA. No evidence for large vessel occlusion. No high-grade or correctable stenosis. MRA NECK IMPRESSION: Normal MRA of the neck. No high-grade or critical flow limiting stenosis identified. Electronically Signed   By: Jeannine Boga M.D.   On: 03/21/2017 21:20   Mr Cervical Spine Wo Contrast  Result Date: 03/24/2017 CLINICAL DATA:  76 year old female with several syncopal episodes. 5-6 month history of progressive dizziness. EXAM: MRI CERVICAL SPINE WITHOUT CONTRAST TECHNIQUE: Multiplanar, multisequence MR imaging of the cervical spine was performed. No intravenous contrast was administered. COMPARISON:  Brain MRI, head and neck MRA 03/21/2017. FINDINGS: Alignment: Straightening of lower cervical lordosis. Subtle anterolisthesis of C7 on T1. Preserved upper cervical lordosis. Vertebrae: No marrow edema or evidence of acute osseous abnormality. Visualized bone marrow signal is within normal limits. Degenerative  endplate marrow signal changes at C5-C6 and C6-C7. Cord: Spinal cord signal is within normal limits at all visualized levels. Posterior Fossa, vertebral arteries, paraspinal tissues: Stable visible brain parenchyma. Preserved major vascular flow voids in the neck. Dominant left vertebral artery re- demonstrated. Negative neck soft tissues. Disc levels: C2-C3:  Negative. C3-C4: Mild disc bulge and endplate spurring. Mild facet and ligament flavum hypertrophy. No spinal stenosis. Mild bilateral C4 foraminal stenosis. C4-C5: Moderate facet hypertrophy greater on the right. Minimal disc bulge and endplate spurring. No spinal stenosis. Moderate right C5 neural foraminal stenosis. C5-C6: Disc space loss with circumferential disc osteophyte complex. Broad-based posterior  and bilateral foraminal components. Mild to moderate facet and ligament flavum hypertrophy. Spinal stenosis with no definite spinal cord mass effect. Moderate to severe bilateral C6 foraminal stenosis. C6-C7: Disc space loss with circumferential disc osteophyte complex. Broad-based posterior component in the midline. Mild to moderate ligament flavum hypertrophy. Spinal stenosis with no spinal cord mass effect. Mild bilateral C7 foraminal stenosis. C7-T1: Circumferential disc bulge and endplate spurring. Mild left and moderate right facet hypertrophy. Ligament flavum hypertrophy. No spinal stenosis. Mild right C8 neural foraminal stenosis. Upper thoracic facet hypertrophy. No upper thoracic spinal stenosis. IMPRESSION: 1. Lower cervical spine disc, endplate, and facet degeneration. 2. Multifactorial mild spinal stenosis at C5-C6 and C6-C7. No spinal cord signal abnormality, no definite cord mass effect. 3. Multifactorial moderate or severe right C5 and bilateral C6 nerve level foraminal stenosis. Electronically Signed   By: Genevie Ann M.D.   On: 03/24/2017 16:50   Dg Humerus Right  Result Date: 03/21/2017 CLINICAL DATA:  76 year old female with dizziness  and fall today. Right side pain. EXAM: RIGHT HUMERUS - 2+ VIEW COMPARISON:  Chest CT 10/07/2016.  Chest radiographs 03/04/2015. FINDINGS: A small triangular ossific fragment at the right acromioclavicular joint is chronic and unchanged from the CT earlier this year. Grossly normal alignment at the right shoulder. Bone mineralization is within normal limits for age. The right humerus appears intact. Grossly normal alignment at the right elbow. Visible right ribs and lung parenchyma appear stable. IMPRESSION: No acute fracture or dislocation identified about the right humerus. Electronically Signed   By: Genevie Ann M.D.   On: 03/21/2017 13:20   Dg Hip Unilat W Or Wo Pelvis 2-3 Views Right  Result Date: 03/21/2017 CLINICAL DATA:  Dizziness, fall, right side pain EXAM: DG HIP (WITH OR WITHOUT PELVIS) 2-3V RIGHT COMPARISON:  None FINDINGS: Hip joints and SI joints are symmetric and unremarkable. No acute bony abnormality. Specifically, no fracture, subluxation, or dislocation. Soft tissues are intact. IMPRESSION: No acute bony abnormality. Electronically Signed   By: Rolm Baptise M.D.   On: 03/21/2017 13:20    Results/Tests Pending at Time of Discharge:  Unresulted Labs (From admission, onward)   Start     Ordered   03/26/17 1207  HIV antibody  Add-on,   R    Question:  Specimen collection method  Answer:  IV Team=IV Team collect   03/26/17 1206   03/25/17 1448  Basic metabolic panel  Daily,   R     03/24/17 1150   03/25/17 0500  CBC  Daily,   R     03/24/17 1150      Discharge Medications:  Allergies as of 03/27/2017   No Known Allergies     Medication List    STOP taking these medications   gabapentin 600 MG tablet Commonly known as:  NEURONTIN Replaced by:  gabapentin 100 MG capsule   tiZANidine 4 MG tablet Commonly known as:  ZANAFLEX     TAKE these medications   amLODipine 10 MG tablet Commonly known as:  NORVASC Take 10 mg by mouth daily.   CALCIUM 1200+D3 PO Take 1 tablet by  mouth daily.   dorzolamide-timolol 22.3-6.8 MG/ML ophthalmic solution Commonly known as:  COSOPT Place 2 drops into both eyes daily.   gabapentin 100 MG capsule Commonly known as:  NEURONTIN Take 1 capsule (100 mg total) by mouth 2 (two) times daily with breakfast and lunch. Replaces:  gabapentin 600 MG tablet   gabapentin 300 MG capsule Commonly known as:  NEURONTIN  Take 1 capsule (300 mg total) by mouth at bedtime.   hydrochlorothiazide 12.5 MG capsule Commonly known as:  MICROZIDE Take 1 capsule (12.5 mg total) by mouth daily.   multivitamin tablet Take 1 tablet by mouth daily.   nabumetone 750 MG tablet Commonly known as:  RELAFEN Take 750 mg by mouth 2 (two) times daily.   omeprazole 20 MG capsule Commonly known as:  PRILOSEC Take 20 mg by mouth daily.   PRAVACHOL 20 MG tablet Generic drug:  pravastatin Take 20 mg by mouth daily.   Travoprost (BAK Free) 0.004 % Soln ophthalmic solution Commonly known as:  TRAVATAN Place 1 drop into both eyes at bedtime.   venlafaxine XR 150 MG 24 hr capsule Commonly known as:  EFFEXOR-XR Take 1 capsule (150 mg total) by mouth daily with breakfast. Start taking on:  03/28/2017 What changed:    when to take this  additional instructions  Another medication with the same name was removed. Continue taking this medication, and follow the directions you see here.            Durable Medical Equipment  (From admission, onward)        Start     Ordered   03/22/17 1108  For home use only DME Walker rolling  Once    Comments:  5 in wheels  Question:  Patient needs a walker to treat with the following condition  Answer:  Vertigo   03/22/17 1107      Discharge Instructions: Please refer to Patient Instructions section of EMR for full details.  Patient was counseled important signs and symptoms that should prompt return to medical care, changes in medications, dietary instructions, activity restrictions, and follow up  appointments.   Follow-Up Appointments: Follow-up Information    Moon, Amy A, NP. Schedule an appointment as soon as possible for a visit in 2 day(s).   Specialty:  Internal Medicine Why:  Please follow up with your PCP within 1 week of discharge Contact information: [place] [address] [phone]        TatEustace Quail, DO. Go on 04/20/2017.   Specialty:  Neurology Why:  @ 9:30am.  Contact information: [place] [address] [place]. Schedule an appointment as soon as possible for a visit.   Why:  Please call to make a follow up appointment with cardiology  Contact information: [place] [id number] [place], [place], [place] 03/27/2017, 12:30 PM PGY-1, [place]

## 2017-03-22 NOTE — Consult Note (Signed)
NEURO HOSPITALIST CONSULT NOTE   Requestig physician: Dr. Andria Frames   Reason for Consult: Syncope   History obtained from:  Patient     HPI:                                                                                                                                          Cassandra Brooks is an 76 y.o. female who states that she has been having issues of dizziness for the past 6 months.  Her dizziness occurs when she stands up and gets out of bed or stands up out of a chair.  At times it will not occur until she is walked a few steps.  She states this is the first time she is actually  passed out.  Often times if she starts to get dizzy and sits down it does help her with her symptoms but it does not take away her symptoms completely.  The symptoms may linger for quite a long time.  Today she stood up, took a few steps to get to the doorway the bathroom at night and then felt as if the room was "closing in"and the next thing she knows she was on the floor.  Her husband was present, and states that there was no tonic-clonic or any myoclonic movements.  He states that she was out for approximately 1-2 minutes and when she came to she was alert and oriented and knew she where she was.  Patient does know that she has a tremor many times when she gets dizzy, but she attributes this to her heart racing and anxiety about the fact that she is dizzy.  She denies any tonic-clonic or jerking action and states that it is just a fine tremor.  She denies any vertigo, incontinence, tongue biting.   As stated she denies that this occurs while she is laying down.  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  Past Medical History:  Diagnosis Date  . AAA (abdominal aortic aneurysm) ([place])   . Arthritis    "knees" (03/21/2017)  . Borderline diabetes   . Bulging lumbar disc   . Depression   . Fibromyalgia   . GERD (gastroesophageal reflux disease)    . Glaucoma, both eyes   . High cholesterol    "don't have it but I take RX" (03/21/2017)  . Hypertension   . Syncope and collapse     Past Surgical History:  Procedure Laterality Date  . ABDOMINAL AORTIC ANEURYSM REPAIR  1966  . APPENDECTOMY  1966  . VAGINAL HYSTERECTOMY     "partial"  . VIDEO BRONCHOSCOPY Bilateral 03/19/2015   Procedure: VIDEO BRONCHOSCOPY WITHOUT FLUORO;  Surgeon: Rigoberto Noel, MD;  Location: [facility];  Service: Cardiopulmonary;  Laterality: Bilateral;    History reviewed. No pertinent family history.   Social History:  reports that  has never smoked. she has never used smokeless tobacco. She reports that she does not drink alcohol or use drugs.  No Known Allergies  MEDICATIONS:                                                                                                                     Scheduled: . amLODipine  10 mg Oral Daily  . dorzolamide-timolol  2 drop Both Eyes Daily  . enoxaparin (LOVENOX) injection  40 mg Subcutaneous Q24H  . gabapentin  300 mg Oral BID WC  . gabapentin  600 mg Oral QHS  . hydrochlorothiazide  12.5 mg Oral Daily  . latanoprost  1 drop Both Eyes QHS  . multivitamin with minerals  1 tablet Oral Daily  . pantoprazole  40 mg Oral Daily  . pravastatin  20 mg Oral q1800  . sodium chloride flush  3 mL Intravenous Q12H  . venlafaxine XR  225 mg Oral Q breakfast     ROS:                                                                                                                                       History obtained from the patient  General ROS: negative for - chills, fatigue, fever, night sweats, weight gain or weight loss Psychological ROS: negative for - behavioral disorder, hallucinations, memory difficulties, mood swings or suicidal ideation Ophthalmic ROS: negative for - blurry vision, double vision, eye pain or loss of vision ENT ROS: negative for - epistaxis, nasal discharge, oral lesions, sore throat,  tinnitus or vertigo Allergy and Immunology ROS: negative for - hives or itchy/watery eyes Hematological and Lymphatic ROS: negative for - bleeding problems, bruising or swollen lymph nodes Endocrine ROS: negative for - galactorrhea, hair pattern changes, polydipsia/polyuria or temperature intolerance Respiratory ROS: negative for - cough, hemoptysis, shortness of breath or wheezing Cardiovascular ROS: negative for - chest pain, dyspnea on exertion, edema or irregular heartbeat Gastrointestinal ROS: negative for - abdominal pain, diarrhea, hematemesis, nausea/vomiting or stool incontinence Genito-Urinary ROS: negative for - dysuria, hematuria, incontinence or urinary frequency/urgency Musculoskeletal ROS: negative for - joint swelling or muscular weakness Neurological ROS: as noted in HPI Dermatological ROS: negative for rash and skin lesion changes   Blood pressure (!) 150/60, pulse 69, temperature 98.6 F (37 C),  temperature source Oral, resp. rate 18, height 5\' 2"  (1.575 m), weight 78.1 kg (172 lb 2.9 oz), SpO2 94 %.   Neurologic Examination:                                                                                                      HEENT-  Normocephalic, no lesions, without obvious abnormality.  Normal external eye and conjunctiva.  Normal TM's bilaterally.  Normal auditory canals and external ears. Normal external nose, mucus membranes and septum.  Normal pharynx. Cardiovascular- S1, S2 normal, pulses palpable throughout   Lungs- chest clear, no wheezing, rales, normal symmetric air entry Abdomen- normal findings: bowel sounds normal Extremities- no edema Lymph-no adenopathy palpable Musculoskeletal-positive left leg weakness secondary to polio Skin-warm and dry, no hyperpigmentation, vitiligo, or suspicious lesions  Neurological Examination Mental Status: Alert, oriented, thought content appropriate.  Speech fluent without evidence of aphasia.  Able to follow 3 step  commands without difficulty. Cranial Nerves: II:  Visual fields grossly normal,  III,IV, VI: ptosis not present, extra-ocular motions intact bilaterally pupils equal, round, reactive to light and accommodation V,VII: smile symmetric, facial light touch sensation normal bilaterally VIII: hearing normal bilaterally IX,X: uvula rises symmetrically XI: bilateral shoulder shrug XII: midline tongue extension Motor: Right : Upper extremity   5/5    Left:     Upper extremity   5/5  Lower extremity   5/5     Lower extremity   3/5 Tone and bulk:normal tone throughout; no atrophy noted Sensory: Pinprick and light touch intact throughout, bilaterally Deep Tendon Reflexes: 2+ and symmetric throughout in the upper extremities with no knee jerk or ankle jerk  plantars: Right: downgoing   Left: downgoing Cerebellar: normal finger-to-nose,  Gait: Not tested    Lab Results: Basic Metabolic Panel: Recent Labs  Lab 03/21/17 0952 03/22/17 0336  NA 139 140  K 4.4 3.6  CL 104 106  CO2 26 23  GLUCOSE 112* 101*  BUN 18 15  CREATININE 1.17* 0.85  CALCIUM 9.0 8.5*    Liver Function Tests: No results for input(s): AST, ALT, ALKPHOS, BILITOT, PROT, ALBUMIN in the last 168 hours. No results for input(s): LIPASE, AMYLASE in the last 168 hours. No results for input(s): AMMONIA in the last 168 hours.  CBC: Recent Labs  Lab 03/21/17 0952 03/22/17 0336  WBC 4.0 4.5  HGB 14.6 13.7  HCT 43.9 41.9  MCV 107.1* 105.0*  PLT 210 186    Cardiac Enzymes: Recent Labs  Lab 03/22/17 1201  TROPONINI <0.03    Lipid Panel: No results for input(s): CHOL, TRIG, HDL, CHOLHDL, VLDL, LDLCALC in the last 168 hours.  CBG: Recent Labs  Lab 03/22/17 0614  GLUCAP 104*    Microbiology: Results for orders placed or performed during the hospital encounter of 03/19/15  AFB culture with smear     Status: None   Collection Time: 03/19/15  8:57 AM  Result Value Ref Range Status   Specimen Description  BRONCHIAL ALVEOLAR LAVAGE  Final   Special Requests Normal  Final   Acid Fast Smear  Final    NO ACID FAST BACILLI SEEN Performed at [facility]    Culture   Final    NO ACID FAST BACILLI ISOLATED IN 6 WEEKS Performed at [facility]    Report Status 05/01/2015 FINAL  Final  Culture, bal-quantitative     Status: None   Collection Time: 03/19/15  8:57 AM  Result Value Ref Range Status   Specimen Description BRONCHIAL ALVEOLAR LAVAGE  Final   Special Requests Normal  Final   Gram Stain   Final    RARE WBC PRESENT, PREDOMINANTLY PMN RARE SQUAMOUS EPITHELIAL CELLS PRESENT RARE GRAM POSITIVE COCCI IN PAIRS Performed at [facility]   Final    >=100,000 COLONIES/ML Performed at [facility]    Culture   Final    Non-Pathogenic Oropharyngeal-type Flora Isolated. Performed at [facility]    Report Status 03/22/2015 FINAL  Final    Coagulation Studies: No results for input(s): LABPROT, INR in the last 72 hours.  Imaging:  HEAD IMPRESSION: Normal intracranial MRA. No evidence for large vessel occlusion. No high-grade or correctable stenosis. MRA NECK IMPRESSION: Normal MRA of the neck. No high-grade or critical flow limiting stenosis identified. Electronically Signed   By: Jeannine Boga M.D.   On: 03/21/2017 21:20    IMPRESSION: MRI HEAD IMPRESSION: 1. No acute intracranial infarct or other abnormality identified. 2. Generalized age-related cerebral atrophy with moderate chronic small vessel ischemic disease. MRA HEAD IMPRESSION: Normal intracranial MRA. No evidence for large vessel occlusion. No high-grade or correctable stenosis. MRA NECK IMPRESSION: Normal MRA of the neck. No high-grade or critical flow limiting stenosis identified. Electronically Signed   By: Jeannine Boga M.D.   On: 03/21/2017 21:20     Echocardiogram was done and showed a 60-65% ejection fraction with wall motion normal.  She does have a  grade 1 diastolic dysfunction.  EEG was obtained and did not show any epileptiform activity.  Assessment and plan per attending neurologist  Etta Quill PA-C [facility] [phone]  03/22/2017, 4:03 PM  I have seen the patient and reviewed the above note.  She does endorse dizziness that typically starts several minutes after standing up, but not right away.  She also endorses a tremor that she is not very aware of, but her husband some months points it out.  When she notices it she states that she makes a fist which helps with it.  It is not currently present for me to observe.  Her husband states that she has been moving slower over the past few years, but "so have I, I think is just getting older."  He does endorse that she talks in her sleep, and makes arm movements during her sleep.  He states that she has been doing this for many years, however, even when they first got married 25 years ago.  She has no findings on exam to suggest increased tone, does not appear bradykinetic, and has no resting tremor.  Assessment/Plan: This is a 76 year old female presented to the episodes with dizziness, now with syncope.  The description sounds most consistent with delayed orthostatic hypotension.  One cause of resting tremor as well as orthostatic hypotension his Parkinson's disease, but I do not think that I can make that diagnosis at this time.  With the dizziness being such a prominent component, multiple system atrophy could be considered but first signs of autonomic failure would have to be documented.  I have  very low suspicion for seizure at this time.  Recommend: 1) orthostatic vital signs with 3-minute, 5-minute and 10-minute delayed standing vitals. 2) consider tilt table test if orthostatic vital signs are normal 3) continue to keep patient hydrated 4) could consider conservative measures such as compression stockings and abdominal binders.   Roland Rack, MD [facility] [phone]  If 7pm- 7am, please page neurology on call as listed in [place].

## 2017-03-22 NOTE — Progress Notes (Signed)
  Echocardiogram 2D Echocardiogram has been performed.  Cassandra Brooks 03/22/2017, 2:18 PM

## 2017-03-22 NOTE — Procedures (Signed)
ELECTROENCEPHALOGRAM REPORT  Date of Study: 03/22/2017  Patient's Name: Cassandra Brooks MRN: [id number] Date of Birth: [birth year]  Referring Provider: Lucila Maine, DO  Clinical History: 76 year old woman with dizziness and syncope.  Medications: Gabapentin Venlafaxine XR Microzide Tylenol amlodipine Cosopt   Technical Summary: A multichannel digital EEG recording measured by the international 10-20 system with electrodes applied with paste and impedances below 5000 ohms performed in our laboratory with EKG monitoring in an awake and drowsy patient.  Hyperventilation was not performed.  Photic stimulation was performed.  The digital EEG was referentially recorded, reformatted, and digitally filtered in a variety of bipolar and referential montages for optimal display.    Description: The patient is awake and drowsy during the recording.  During maximal wakefulness, there is a symmetric, low voltage 10 Hz posterior dominant rhythm that attenuates with eye opening.  The record is symmetric.  Stage 2 sleep is not seen.  Photic stimulation produced no abnormalities.  There were no epileptiform discharges or electrographic seizures seen.    EKG lead was unremarkable.  Impression: This awake and drowsy EEG is normal.    Clinical Correlation: A normal EEG does not exclude a clinical diagnosis of epilepsy.  If further clinical questions remain, prolonged EEG may be helpful.  Clinical correlation is advised.   Metta Clines, DO

## 2017-03-22 NOTE — Progress Notes (Signed)
Family Medicine Teaching Service Daily Progress Note Intern Pager: [phone]  Patient name: Cassandra Brooks Medical record number: [id number] Date of birth: 03-11-1941 Age: 76 y.o. Gender: female  Primary Care Provider: Lowella Dandy, NP Consultants: neurology, cardiology   Code Status: full   Pt Overview and Major Events to Date:  Admitted to [place] on 03/21/17  Assessment and Plan: Cassandra Brooks is a 76 y.o. female presenting with dizziness and syncope. PMH is significant for HTN, glaucoma, GERD, HLD, OA, and anxiety.  Syncopal episodes Per patient she has had at least 4 syncopal episodes over the past several days. Prior to this she has had a 2 month history of dizziness that has been worsening. She also endorses "shaking" episodes that occur frequently without LOC. Differential diagnosis includes vasovagal syncope, vertigo, posterior circulation insufficiency, possibly seizure activity, or psychogenic. Head Ct negative. Patient's orthostatic vitals negative in ED. Neuro exam largely normal except for focal left leg weakness, which is likely polio as child and possibly post-polio syndrome. No prodrome for syncopal episodes which is concerning. MRI showed no acute intracranal abnormality, MRA head and neck normal. AM EKG unchanged from admission. TSH wnl. EEG did not show epileptiform activity.  -monitor on telemetry -trend trops  -syncopal work up: echo pending  -PT/OT consult-recommend [facility] -neurology consulted, appreciate recommendations. Recommend orthostatic vital signs w/ 3-min, 5-min, and 10-min delayed standing vitals. If orthostatics normal will obtain tilt table test. Could consider conservative measures.  -cardiology consulted for event monitor as outpatient   Macrocytosis Seen on labs, MCV 108 on admission. MCV today of 105.9. Normal hemoglobin. Patient denies alcohol use. She has been on long term prilosec. TSH wnl. B12 wnl.  -folate pending  HTN Current BP 133/55. Home  meds: amlodipine  -continue norvasc 10mg , continue HCTZ -monitor BP  HLD Home meds: pravachol 20 mg -continue home medication  Anxiety Home meds: 225 mg effexor daily. Anxiety possibly contributing to "shaking episodes" -continue home med  Glaucoma Home meds: cosopt eye drops -continue home eye drops  GERD Home meds: prilosec daily -continue PPI  OA of spine, hips, knees Home meds: nabumetone at home -hold NSAID due to mildly elevated Cr, restart as able -tylenol as needed for pain  FEN/GI: regular diet Prophylaxis: lovenox  Disposition: continued inpatient stay for further work up   Subjective:  Patient today reports some shaking yesterday, but did not inform nursing staff. Patient denies syncopal events. Patient's daughter states she has been dizzy but will not tell staff when it occurs.   Objective: Temp:  [98.1 F (36.7 C)-98.8 F (37.1 C)] 98.1 F (36.7 C) (12/13 1000) Pulse Rate:  [64-83] 83 (12/13 1000) Resp:  [16-20] 16 (12/13 1000) BP: (133-166)/(55-73) 166/73 (12/13 1000) SpO2:  [94 %-98 %] 95 % (12/13 1000) Physical Exam: General: awake and alert, sitting up in chair Cardiovascular: RRR, no MRG Respiratory: CTAB, no wheezes, rales or rhonchi  Abdomen: soft, non tender, non distended, bowel sounds normal  Extremities: non tender, 5/5 muscle strength in RLE, 4/5 muscle strength in LLE Neuro: CN2-12 grossly intact, sensation intact bilaterally, PERRL, EOMI, no finger to nose dysmetria   Laboratory: Recent Labs  Lab 03/21/17 0952 03/22/17 0336 03/23/17 0453  WBC 4.0 4.5 4.4  HGB 14.6 13.7 14.6  HCT 43.9 41.9 44.7  PLT 210 186 177   Recent Labs  Lab 03/21/17 0952 03/22/17 0336 03/23/17 0453  NA 139 140 140  K 4.4 3.6 3.7  CL 104 106 106  CO2 26 23 24  BUN 18 15 15   CREATININE 1.17* 0.85 0.86  CALCIUM 9.0 8.5* 8.8*  GLUCOSE 112* 101* 114*     Ref. Range 03/22/2017 03:36  TSH Latest Ref Range: 0.350 - 4.500 uIU/mL 1.677    Ref.  Range 03/22/2017 03:36  Vitamin B12 Latest Ref Range: 180 - 914 pg/mL 342   Imaging/Diagnostic Tests: Dg Chest 2 View  Result Date: 03/21/2017 CLINICAL DATA:  Dizziness, fall EXAM: CHEST  2 VIEW COMPARISON:  Chest CT 10/07/2016 FINDINGS: Mild peribronchial thickening. Bibasilar atelectasis. Heart is normal size. No effusions or acute bony abnormality. IMPRESSION: Mild bronchitic changes, bibasilar atelectasis. Electronically Signed   By: Rolm Baptise M.D.   On: 03/21/2017 13:18   Ct Head Wo Contrast  Result Date: 03/21/2017 CLINICAL DATA:  76 year old female with multiple syncopal episodes and falls. Denies headache. Initial encounter. EXAM: CT HEAD WITHOUT CONTRAST TECHNIQUE: Contiguous axial images were obtained from the base of the skull through the vertex without intravenous contrast. COMPARISON:  None. FINDINGS: Brain: No intracranial hemorrhage or CT evidence of large acute infarct. Mild chronic microvascular changes. Mild global atrophy without hydrocephalus. No intracranial mass lesion noted on this unenhanced exam. Vascular: Vascular calcifications Skull: No skull fracture Sinuses/Orbits: No acute orbital abnormality. Visualized paranasal sinuses are clear. Other: Mastoid air cells and middle ear cavities are clear. Temporomandibular joint degenerative changes. IMPRESSION: Normal for age unenhanced head CT. Electronically Signed   By: Genia Del M.D.   On: 03/21/2017 13:06   Mr Angiogram Head W Or Wo Contrast  Result Date: 03/21/2017 CLINICAL DATA:  Initial evaluation for acute dizziness, syncope, ataxia, stroke suspected. EXAM: MRI HEAD WITHOUT AND WITH CONTRAST MRA HEAD WITHOUT CONTRAST MRA NECK WITHOUT AND WITH CONTRAST TECHNIQUE: Multiplanar, multiecho pulse sequences of the brain and surrounding structures were obtained without intravenous contrast. Angiographic images of the Circle of Willis were obtained using MRA technique without intravenous contrast. Angiographic images of the  neck were obtained using MRA technique without and with intravenous contrast. Carotid stenosis measurements (when applicable) are obtained utilizing NASCET criteria, using the distal internal carotid diameter as the denominator. CONTRAST:  50mL MULTIHANCE GADOBENATE DIMEGLUMINE 529 MG/ML IV SOLN COMPARISON:  None. FINDINGS: MRI HEAD FINDINGS Mild diffuse prominence of the CSF containing spaces is compatible with generalized age related cerebral atrophy. Patchy and confluent T2/FLAIR hyperintensity within the periventricular and deep white matter both cerebral hemispheres most consistent with chronic small vessel ischemic disease, overall moderate nature. Chronic microvascular ischemic changes present within the pons. No abnormal foci of restricted diffusion to suggest acute or subacute ischemia. Gray-white matter differentiation maintained. No foci of susceptibility artifact to suggest acute or chronic intracranial hemorrhage. No encephalomalacia to suggest remote cortical infarction. No mass lesion, midline shift or mass effect. No hydrocephalus. No extra-axial fluid collection. Major dural sinuses are grossly patent. No abnormal enhancement. Pituitary gland suprasellar region normal. Midline structures intact and normal. Major intracranial vascular flow voids are maintained. Craniocervical junction within normal limits. Upper cervical spine unremarkable. Bone marrow signal intensity within normal limits. No scalp soft tissue abnormality. Globes and oval soft tissues within normal limits. Paranasal sinuses are clear. No mastoid effusion. Inner ear structures normal. MRA HEAD FINDINGS ANTERIOR CIRCULATION: Distal cervical segments of the internal carotid arteries are widely patent with antegrade flow. Petrous, cavernous, and supraclinoid segments widely patent without stenosis. ICA termini widely patent. A1 segments patent bilaterally. Right A1 segment hypoplastic, which likely accounts for the slightly diminutive  right ICA is compared to the left. Normal anterior communicating  artery. Anterior cerebral arteries widely patent to their distal aspects without stenosis. M1 segments patent without stenosis or occlusion. Normal MCA bifurcations. No proximal M2 occlusion. Distal MCA branches well perfused and symmetric. POSTERIOR CIRCULATION: Vertebral arteries patent to the vertebrobasilar junction without stenosis. Left vertebral artery dominant. Posterior inferior cerebral arteries patent proximally. Basilar artery widely patent to its distal aspect. Superior cerebral arteries patent bilaterally. Both of the posterior cerebral arteries supplied via the basilar and are well perfused to their distal aspects without stenosis. No aneurysm or vascular malformation. MRA NECK FINDINGS Source images reviewed. Visualized aortic arch of normal caliber with normal branch pattern. No flow-limiting stenosis about the origin of the great vessels. Partially visualized subclavian artery is widely patent without stenosis. Right common and internal carotid arteries widely patent without stenosis or occlusion. No significant atheromatous narrowing about the right carotid bifurcation. Left common and internal carotid artery is widely patent without stenosis or occlusion. No significant atheromatous narrowing about the left carotid bifurcation. Both of the vertebral arteries arise from the subclavian arteries. Left vertebral artery dominant. Right vertebral artery diffusely diminutive/hypoplastic. Vertebral arteries patent within the neck without stenosis or occlusion. IMPRESSION: MRI HEAD IMPRESSION: 1. No acute intracranial infarct or other abnormality identified. 2. Generalized age-related cerebral atrophy with moderate chronic small vessel ischemic disease. MRA HEAD IMPRESSION: Normal intracranial MRA. No evidence for large vessel occlusion. No high-grade or correctable stenosis. MRA NECK IMPRESSION: Normal MRA of the neck. No high-grade or  critical flow limiting stenosis identified. Electronically Signed   By: Jeannine Boga M.D.   On: 03/21/2017 21:20   Mr Angiogram Neck W Or Wo Contrast  Result Date: 03/21/2017 CLINICAL DATA:  Initial evaluation for acute dizziness, syncope, ataxia, stroke suspected. EXAM: MRI HEAD WITHOUT AND WITH CONTRAST MRA HEAD WITHOUT CONTRAST MRA NECK WITHOUT AND WITH CONTRAST TECHNIQUE: Multiplanar, multiecho pulse sequences of the brain and surrounding structures were obtained without intravenous contrast. Angiographic images of the Circle of Willis were obtained using MRA technique without intravenous contrast. Angiographic images of the neck were obtained using MRA technique without and with intravenous contrast. Carotid stenosis measurements (when applicable) are obtained utilizing NASCET criteria, using the distal internal carotid diameter as the denominator. CONTRAST:  64mL MULTIHANCE GADOBENATE DIMEGLUMINE 529 MG/ML IV SOLN COMPARISON:  None. FINDINGS: MRI HEAD FINDINGS Mild diffuse prominence of the CSF containing spaces is compatible with generalized age related cerebral atrophy. Patchy and confluent T2/FLAIR hyperintensity within the periventricular and deep white matter both cerebral hemispheres most consistent with chronic small vessel ischemic disease, overall moderate nature. Chronic microvascular ischemic changes present within the pons. No abnormal foci of restricted diffusion to suggest acute or subacute ischemia. Gray-white matter differentiation maintained. No foci of susceptibility artifact to suggest acute or chronic intracranial hemorrhage. No encephalomalacia to suggest remote cortical infarction. No mass lesion, midline shift or mass effect. No hydrocephalus. No extra-axial fluid collection. Major dural sinuses are grossly patent. No abnormal enhancement. Pituitary gland suprasellar region normal. Midline structures intact and normal. Major intracranial vascular flow voids are maintained.  Craniocervical junction within normal limits. Upper cervical spine unremarkable. Bone marrow signal intensity within normal limits. No scalp soft tissue abnormality. Globes and oval soft tissues within normal limits. Paranasal sinuses are clear. No mastoid effusion. Inner ear structures normal. MRA HEAD FINDINGS ANTERIOR CIRCULATION: Distal cervical segments of the internal carotid arteries are widely patent with antegrade flow. Petrous, cavernous, and supraclinoid segments widely patent without stenosis. ICA termini widely patent. A1 segments patent bilaterally. Right A1  segment hypoplastic, which likely accounts for the slightly diminutive right ICA is compared to the left. Normal anterior communicating artery. Anterior cerebral arteries widely patent to their distal aspects without stenosis. M1 segments patent without stenosis or occlusion. Normal MCA bifurcations. No proximal M2 occlusion. Distal MCA branches well perfused and symmetric. POSTERIOR CIRCULATION: Vertebral arteries patent to the vertebrobasilar junction without stenosis. Left vertebral artery dominant. Posterior inferior cerebral arteries patent proximally. Basilar artery widely patent to its distal aspect. Superior cerebral arteries patent bilaterally. Both of the posterior cerebral arteries supplied via the basilar and are well perfused to their distal aspects without stenosis. No aneurysm or vascular malformation. MRA NECK FINDINGS Source images reviewed. Visualized aortic arch of normal caliber with normal branch pattern. No flow-limiting stenosis about the origin of the great vessels. Partially visualized subclavian artery is widely patent without stenosis. Right common and internal carotid arteries widely patent without stenosis or occlusion. No significant atheromatous narrowing about the right carotid bifurcation. Left common and internal carotid artery is widely patent without stenosis or occlusion. No significant atheromatous narrowing  about the left carotid bifurcation. Both of the vertebral arteries arise from the subclavian arteries. Left vertebral artery dominant. Right vertebral artery diffusely diminutive/hypoplastic. Vertebral arteries patent within the neck without stenosis or occlusion. IMPRESSION: MRI HEAD IMPRESSION: 1. No acute intracranial infarct or other abnormality identified. 2. Generalized age-related cerebral atrophy with moderate chronic small vessel ischemic disease. MRA HEAD IMPRESSION: Normal intracranial MRA. No evidence for large vessel occlusion. No high-grade or correctable stenosis. MRA NECK IMPRESSION: Normal MRA of the neck. No high-grade or critical flow limiting stenosis identified. Electronically Signed   By: Jeannine Boga M.D.   On: 03/21/2017 21:20   Mr Jeri Cos And Wo Contrast  Result Date: 03/21/2017 CLINICAL DATA:  Initial evaluation for acute dizziness, syncope, ataxia, stroke suspected. EXAM: MRI HEAD WITHOUT AND WITH CONTRAST MRA HEAD WITHOUT CONTRAST MRA NECK WITHOUT AND WITH CONTRAST TECHNIQUE: Multiplanar, multiecho pulse sequences of the brain and surrounding structures were obtained without intravenous contrast. Angiographic images of the Circle of Willis were obtained using MRA technique without intravenous contrast. Angiographic images of the neck were obtained using MRA technique without and with intravenous contrast. Carotid stenosis measurements (when applicable) are obtained utilizing NASCET criteria, using the distal internal carotid diameter as the denominator. CONTRAST:  64mL MULTIHANCE GADOBENATE DIMEGLUMINE 529 MG/ML IV SOLN COMPARISON:  None. FINDINGS: MRI HEAD FINDINGS Mild diffuse prominence of the CSF containing spaces is compatible with generalized age related cerebral atrophy. Patchy and confluent T2/FLAIR hyperintensity within the periventricular and deep white matter both cerebral hemispheres most consistent with chronic small vessel ischemic disease, overall moderate  nature. Chronic microvascular ischemic changes present within the pons. No abnormal foci of restricted diffusion to suggest acute or subacute ischemia. Gray-white matter differentiation maintained. No foci of susceptibility artifact to suggest acute or chronic intracranial hemorrhage. No encephalomalacia to suggest remote cortical infarction. No mass lesion, midline shift or mass effect. No hydrocephalus. No extra-axial fluid collection. Major dural sinuses are grossly patent. No abnormal enhancement. Pituitary gland suprasellar region normal. Midline structures intact and normal. Major intracranial vascular flow voids are maintained. Craniocervical junction within normal limits. Upper cervical spine unremarkable. Bone marrow signal intensity within normal limits. No scalp soft tissue abnormality. Globes and oval soft tissues within normal limits. Paranasal sinuses are clear. No mastoid effusion. Inner ear structures normal. MRA HEAD FINDINGS ANTERIOR CIRCULATION: Distal cervical segments of the internal carotid arteries are widely patent with antegrade flow. Petrous,  cavernous, and supraclinoid segments widely patent without stenosis. ICA termini widely patent. A1 segments patent bilaterally. Right A1 segment hypoplastic, which likely accounts for the slightly diminutive right ICA is compared to the left. Normal anterior communicating artery. Anterior cerebral arteries widely patent to their distal aspects without stenosis. M1 segments patent without stenosis or occlusion. Normal MCA bifurcations. No proximal M2 occlusion. Distal MCA branches well perfused and symmetric. POSTERIOR CIRCULATION: Vertebral arteries patent to the vertebrobasilar junction without stenosis. Left vertebral artery dominant. Posterior inferior cerebral arteries patent proximally. Basilar artery widely patent to its distal aspect. Superior cerebral arteries patent bilaterally. Both of the posterior cerebral arteries supplied via the basilar  and are well perfused to their distal aspects without stenosis. No aneurysm or vascular malformation. MRA NECK FINDINGS Source images reviewed. Visualized aortic arch of normal caliber with normal branch pattern. No flow-limiting stenosis about the origin of the great vessels. Partially visualized subclavian artery is widely patent without stenosis. Right common and internal carotid arteries widely patent without stenosis or occlusion. No significant atheromatous narrowing about the right carotid bifurcation. Left common and internal carotid artery is widely patent without stenosis or occlusion. No significant atheromatous narrowing about the left carotid bifurcation. Both of the vertebral arteries arise from the subclavian arteries. Left vertebral artery dominant. Right vertebral artery diffusely diminutive/hypoplastic. Vertebral arteries patent within the neck without stenosis or occlusion. IMPRESSION: MRI HEAD IMPRESSION: 1. No acute intracranial infarct or other abnormality identified. 2. Generalized age-related cerebral atrophy with moderate chronic small vessel ischemic disease. MRA HEAD IMPRESSION: Normal intracranial MRA. No evidence for large vessel occlusion. No high-grade or correctable stenosis. MRA NECK IMPRESSION: Normal MRA of the neck. No high-grade or critical flow limiting stenosis identified. Electronically Signed   By: Jeannine Boga M.D.   On: 03/21/2017 21:20   Dg Humerus Right  Result Date: 03/21/2017 CLINICAL DATA:  76 year old female with dizziness and fall today. Right side pain. EXAM: RIGHT HUMERUS - 2+ VIEW COMPARISON:  Chest CT 10/07/2016.  Chest radiographs 03/04/2015. FINDINGS: A small triangular ossific fragment at the right acromioclavicular joint is chronic and unchanged from the CT earlier this year. Grossly normal alignment at the right shoulder. Bone mineralization is within normal limits for age. The right humerus appears intact. Grossly normal alignment at the right  elbow. Visible right ribs and lung parenchyma appear stable. IMPRESSION: No acute fracture or dislocation identified about the right humerus. Electronically Signed   By: Genevie Ann M.D.   On: 03/21/2017 13:20   Dg Hip Unilat W Or Wo Pelvis 2-3 Views Right  Result Date: 03/21/2017 CLINICAL DATA:  Dizziness, fall, right side pain EXAM: DG HIP (WITH OR WITHOUT PELVIS) 2-3V RIGHT COMPARISON:  None FINDINGS: Hip joints and SI joints are symmetric and unremarkable. No acute bony abnormality. Specifically, no fracture, subluxation, or dislocation. Soft tissues are intact. IMPRESSION: No acute bony abnormality. Electronically Signed   By: Rolm Baptise M.D.   On: 03/21/2017 13:20     Caroline More, DO 03/23/2017, 1:19 PM PGY-1, [place] Intern pager: [phone], text pages welcome

## 2017-03-22 NOTE — Evaluation (Addendum)
Physical Therapy Evaluation Patient Details Name: Cassandra Brooks MRN: [id number] DOB: 1940/07/03 Today's Date: 03/22/2017   History of Present Illness  Patient is a 76 y/o female who presents with dizziness and syncope episodes at home associated with slurred speech and shaking episodes. Head CT, Brain MRI/MRA unremarkable. CXR- bibasilar atelectasis. PMH includes HTN, AAA, glaucoma bil eyes, depression, fibromyalgia. Concern for seizures.  Clinical Impression  Patient presents with vertigo/dizziness, HA and impaired mobility s/p above. Reports her symptoms are worsened with movement, lasts for hours at a time and result in falls and syncope. Per daughter, dizziness started this year but has progressively getting worse over the last few weeks. Pt reports symptoms worse with looking up/down, but no nystagmus noted. Education re: gaze stabilization and safety with mobility. Pt lives with spouse and is mostly independent PTA but has required more assist recently due to dizziness. Orthostatics negative. Recommend vestibular assessment to rule out peripheral cause of dizziness. If so, may need follow up therapy. Will follow acutely to maximize independence and mobility prior to return home.     Follow Up Recommendations Home health PT;Supervision for mobility/OOB    Equipment Recommendations  Rolling walker with 5" wheels    Recommendations for Other Services       Precautions / Restrictions Precautions Precautions: Fall Precaution Comments: vertigo Restrictions Weight Bearing Restrictions: No      Mobility  Bed Mobility Overal bed mobility: Needs Assistance Bed Mobility: Supine to Sit     Supine to sit: Min guard;HOB elevated     General bed mobility comments: Use of rail and increased time. + dizziness sitting EOB. Pt closing eyes, cues for gaze stabilization.  Transfers Overall transfer level: Needs assistance Equipment used: None;Rolling walker (2 wheeled) Transfers: Sit  to/from [facility] Sit to Stand: Min guard Stand pivot transfers: Min assist       General transfer comment: Min guard to steady in standing secondary to dizziness. SPT bed to/from [facility] with min A due to vertigo. Transferred to chair post ambulation.  Ambulation/Gait Ambulation/Gait assistance: Min assist Ambulation Distance (Feet): 20 Feet Assistive device: Rolling walker (2 wheeled) Gait Pattern/deviations: Step-through pattern;Decreased stride length;Narrow base of support Gait velocity: decreased   General Gait Details: Slow, guarded gait using RW; cues for gaze stabilization. Vertigo worsenes with movement.   Stairs            Wheelchair Mobility    Modified Rankin (Stroke Patients Only) Modified Rankin (Stroke Patients Only) Pre-Morbid Rankin Score: Slight disability Modified Rankin: Moderately severe disability     Balance Overall balance assessment: Needs assistance;History of Falls Sitting-balance support: Feet supported;Single extremity supported Sitting balance-Leahy Scale: Fair Sitting balance - Comments: Requires UE support secondary to feelings of dizziness/vertigo   Standing balance support: During functional activity;Single extremity supported Standing balance-Leahy Scale: Poor Standing balance comment: Requires external support for standing balance due to vertigo/dizziness.                             Pertinent Vitals/Pain Pain Assessment: Faces Faces Pain Scale: Hurts a little bit Pain Location: headache Pain Descriptors / Indicators: Headache;Aching;Dull Pain Intervention(s): Monitored during session;Repositioned    Home Living Family/patient expects to be discharged to:: Private residence Living Arrangements: Spouse/significant other Available Help at Discharge: Family;Available 24 hours/day Type of Home: House Home Access: Stairs to enter Entrance Stairs-Rails: None Entrance Stairs-Number of Steps: 2 Home  Layout: One level;Laundry or work area in [employer]:  Cane - single point      Prior Function Level of Independence: Independent         Comments: Holds onto furniture the last couple weeks due to dizziness.otherwise independent; reports multiple falls including down the stairs. Normally can do IADLs and ADLs but has been having more difficulty as dizziness has worsened. Does not drive.      Hand Dominance        Extremity/Trunk Assessment   Upper Extremity Assessment Upper Extremity Assessment: Defer to OT evaluation    Lower Extremity Assessment Lower Extremity Assessment: Overall WFL for tasks assessed(left side weaker from hx of polio but functional.)    Cervical / Trunk Assessment Cervical / Trunk Assessment: Kyphotic  Communication   Communication: No difficulties  Cognition Arousal/Alertness: Awake/alert Behavior During Therapy: WFL for tasks assessed/performed Overall Cognitive Status: Within Functional Limits for tasks assessed                                 General Comments: Aware she is not supposed to get up due to increased risk of falls but wants to anyway.      General Comments General comments (skin integrity, edema, etc.): Difficulty tracking towards right with eyes jumping; decreased cervical AROM throughout with concordant symptoms worse looking up/down but no nystagmus noted. No other symptoms reported. States vertigo comes and goes and it feels like she is moving and wanting to fall forward. + symptoms with head in extension and left SB. Orthostatics negative. Head thrust negative bilaterally but pt has difficulty relaxing.    Exercises     Assessment/Plan    PT Assessment Patient needs continued PT services  PT Problem List Decreased mobility;Decreased safety awareness;Decreased balance;Pain;Decreased knowledge of use of DME;Decreased activity tolerance       PT Treatment Interventions Functional mobility  training;Balance training;Patient/family education;DME instruction;Gait training;Therapeutic activities;Neuromuscular re-education;Therapeutic exercise;Stair training    PT Goals (Current goals can be found in the Care Plan section)  Acute Rehab PT Goals Patient Stated Goal: to make this dizziness go away PT Goal Formulation: With patient Time For Goal Achievement: 04/05/17 Potential to Achieve Goals: Fair    Frequency Min 3X/week   Barriers to discharge        Co-evaluation               AM-PAC PT "6 Clicks" Daily Activity  Outcome Measure Difficulty turning over in bed (including adjusting bedclothes, sheets and blankets)?: None Difficulty moving from lying on back to sitting on the side of the bed? : None Difficulty sitting down on and standing up from a chair with arms (e.g., wheelchair, bedside commode, etc,.)?: A Little Help needed moving to and from a bed to chair (including a wheelchair)?: A Little Help needed walking in hospital room?: A Little Help needed climbing 3-5 steps with a railing? : A Lot 6 Click Score: 19    End of Session Equipment Utilized During Treatment: Gait belt Activity Tolerance: Treatment limited secondary to medical complications (Comment)(vertigo) Patient left: in chair;with call bell/phone within reach;with family/visitor present Nurse Communication: Mobility status PT Visit Diagnosis: Unsteadiness on feet (R26.81);History of falling (Z91.81);Other abnormalities of gait and mobility (R26.89);Difficulty in walking, not elsewhere classified (R26.2);Dizziness and giddiness (R42)    Time: 2706-2376 PT Time Calculation (min) (ACUTE ONLY): 39 min   Charges:   PT Evaluation $PT Eval Moderate Complexity: 1 Mod PT Treatments $Gait Training: 8-22 mins $Therapeutic Activity: 8-22 mins  PT G Codes:   PT G-Codes **NOT FOR INPATIENT CLASS** Functional Limitation: Mobility: Walking and moving around Mobility: Walking and Moving Around Current  Status (S1423): At least 20 percent but less than 40 percent impaired, limited or restricted Mobility: Walking and Moving Around Goal Status [phone]): At least 1 percent but less than 20 percent impaired, limited or restricted    [place], [place], [place] 03/22/2017, 9:27 AM

## 2017-03-22 NOTE — Progress Notes (Signed)
Family Medicine Teaching Service Daily Progress Note Intern Pager: [phone]  Patient name: Cassandra Brooks Medical record number: [id number] Date of birth: 06/14/1940 Age: 76 y.o. Gender: female  Primary Care Provider: Lowella Dandy, NP Consultants: neurology  Code Status: full   Pt Overview and Major Events to Date:  Admitted to [facility] on 03/21/17  Assessment and Plan: Cassandra Brooks is a 76 y.o. female presenting with dizziness and syncope. PMH is significant for HTN, glaucoma, GERD, HLD, OA, and anxiety.  Syncopal episodes Per patient she has had at least 4 syncopal episodes over the past several days. Prior to this she has had a 2 month history of dizziness that has been worsening. She also endorses "shaking" episodes that occur frequently without LOC. Differential diagnosis includes vasovagal syncope, vertigo, posterior circulation insufficiency, possibly seizure activity, or psychogenic. Head Ct negative. Patient's orthostatic vitals negative in ED. Neuro exam largely normal except for focal left leg weakness, which is likely polio as child and possibly post-polio syndrome. No prodrome for syncopal episodes which is concerning. MRI showed no acute intracranal abnormality, MRA head and neck normal. AM EKG unchanged from admission. TSH wnl. EEG normal.  -monitor on telemetry -trend trops  -syncopal work up: echo pending  -PT/OT consult-recommend HH  Macrocytosis Seen on labs, MCV 108 on admission. MCV today of 105. Normal hemoglobin. Patient denies alcohol use. She has been on long term prilosec. TSH wnl. B12 wnl.  -folate pending  HTN Current BP 168/66. Home meds: amlodipine  -continue norvasc 10mg  -would likely benefit from second agent. Will start HCTZ  -monitor BP  HLD Home meds: pravachol 20 mg -continue home medication  Anxiety Home meds: 225 mg effexor daily. Anxiety possibly contributing to "shaking episodes" -continue home med  Glaucoma Home meds: cosopt eye  drops -continue home eye drops  GERD Home meds: prilosec daily -continue PPI  OA of spine, hips, knees Home meds: nabumetone at home -hold NSAID due to mildly elevated Cr, restart as able -tylenol as needed for pain  FEN/GI: regular diet Prophylaxis: lovenox  Disposition: continued inpatient stay for further work up   Subjective:  Patient today in good spirits. Reports some dizziness, patient is spinning but room is not. Denies syncopal episode since hospitalized. Reports shaking and palpitations but states they are intermittent and there are no triggering events. Denies chest pain, SOB, or incontinence during syncopal events.   Objective: Temp:  [97.5 F (36.4 C)-98.2 F (36.8 C)] 98.2 F (36.8 C) (12/12 0512) Pulse Rate:  [71-90] 72 (12/12 0512) Resp:  [12-21] 20 (12/12 0512) BP: (130-173)/(59-103) 167/81 (12/12 0918) SpO2:  [91 %-98 %] 96 % (12/12 0512) Weight:  [172 lb 2.9 oz (78.1 kg)] 172 lb 2.9 oz (78.1 kg) (12/11 1630) Physical Exam: General: awake and alert, sitting in chair, NAD Cardiovascular: RRR, no MRG Respiratory: CTAB, no wheezes, rales or rhonchi  Abdomen: soft, non tender, non distended, bowel sounds normal  Extremities: edema bilaterally (chronic per patient), 4/5 muscle strength in right lower extremity, 3/5 muscle strength in left lower extremity (chronic per patient), decreased grip strength bilaterally Neuro: CN2-12 grossly intact, PERRL, EOMI, sensation intact bilaterally   Laboratory: Recent Labs  Lab 03/21/17 0952 03/22/17 0336  WBC 4.0 4.5  HGB 14.6 13.7  HCT 43.9 41.9  PLT 210 186   Recent Labs  Lab 03/21/17 0952 03/22/17 0336  NA 139 140  K 4.4 3.6  CL 104 106  CO2 26 23  BUN 18 15  CREATININE 1.17* 0.85  CALCIUM  9.0 8.5*  GLUCOSE 112* 101*     Ref. Range 03/22/2017 03:36  TSH Latest Ref Range: 0.350 - 4.500 uIU/mL 1.677    Ref. Range 03/22/2017 03:36  Vitamin B12 Latest Ref Range: 180 - 914 pg/mL 342    Imaging/Diagnostic Tests: Dg Chest 2 View  Result Date: 03/21/2017 CLINICAL DATA:  Dizziness, fall EXAM: CHEST  2 VIEW COMPARISON:  Chest CT 10/07/2016 FINDINGS: Mild peribronchial thickening. Bibasilar atelectasis. Heart is normal size. No effusions or acute bony abnormality. IMPRESSION: Mild bronchitic changes, bibasilar atelectasis. Electronically Signed   By: Rolm Baptise M.D.   On: 03/21/2017 13:18   Ct Head Wo Contrast  Result Date: 03/21/2017 CLINICAL DATA:  76 year old female with multiple syncopal episodes and falls. Denies headache. Initial encounter. EXAM: CT HEAD WITHOUT CONTRAST TECHNIQUE: Contiguous axial images were obtained from the base of the skull through the vertex without intravenous contrast. COMPARISON:  None. FINDINGS: Brain: No intracranial hemorrhage or CT evidence of large acute infarct. Mild chronic microvascular changes. Mild global atrophy without hydrocephalus. No intracranial mass lesion noted on this unenhanced exam. Vascular: Vascular calcifications Skull: No skull fracture Sinuses/Orbits: No acute orbital abnormality. Visualized paranasal sinuses are clear. Other: Mastoid air cells and middle ear cavities are clear. Temporomandibular joint degenerative changes. IMPRESSION: Normal for age unenhanced head CT. Electronically Signed   By: Genia Del M.D.   On: 03/21/2017 13:06   Mr Angiogram Head W Or Wo Contrast  Result Date: 03/21/2017 CLINICAL DATA:  Initial evaluation for acute dizziness, syncope, ataxia, stroke suspected. EXAM: MRI HEAD WITHOUT AND WITH CONTRAST MRA HEAD WITHOUT CONTRAST MRA NECK WITHOUT AND WITH CONTRAST TECHNIQUE: Multiplanar, multiecho pulse sequences of the brain and surrounding structures were obtained without intravenous contrast. Angiographic images of the Circle of Willis were obtained using MRA technique without intravenous contrast. Angiographic images of the neck were obtained using MRA technique without and with intravenous  contrast. Carotid stenosis measurements (when applicable) are obtained utilizing NASCET criteria, using the distal internal carotid diameter as the denominator. CONTRAST:  60mL MULTIHANCE GADOBENATE DIMEGLUMINE 529 MG/ML IV SOLN COMPARISON:  None. FINDINGS: MRI HEAD FINDINGS Mild diffuse prominence of the CSF containing spaces is compatible with generalized age related cerebral atrophy. Patchy and confluent T2/FLAIR hyperintensity within the periventricular and deep white matter both cerebral hemispheres most consistent with chronic small vessel ischemic disease, overall moderate nature. Chronic microvascular ischemic changes present within the pons. No abnormal foci of restricted diffusion to suggest acute or subacute ischemia. Gray-white matter differentiation maintained. No foci of susceptibility artifact to suggest acute or chronic intracranial hemorrhage. No encephalomalacia to suggest remote cortical infarction. No mass lesion, midline shift or mass effect. No hydrocephalus. No extra-axial fluid collection. Major dural sinuses are grossly patent. No abnormal enhancement. Pituitary gland suprasellar region normal. Midline structures intact and normal. Major intracranial vascular flow voids are maintained. Craniocervical junction within normal limits. Upper cervical spine unremarkable. Bone marrow signal intensity within normal limits. No scalp soft tissue abnormality. Globes and oval soft tissues within normal limits. Paranasal sinuses are clear. No mastoid effusion. Inner ear structures normal. MRA HEAD FINDINGS ANTERIOR CIRCULATION: Distal cervical segments of the internal carotid arteries are widely patent with antegrade flow. Petrous, cavernous, and supraclinoid segments widely patent without stenosis. ICA termini widely patent. A1 segments patent bilaterally. Right A1 segment hypoplastic, which likely accounts for the slightly diminutive right ICA is compared to the left. Normal anterior communicating  artery. Anterior cerebral arteries widely patent to their distal aspects without stenosis. M1  segments patent without stenosis or occlusion. Normal MCA bifurcations. No proximal M2 occlusion. Distal MCA branches well perfused and symmetric. POSTERIOR CIRCULATION: Vertebral arteries patent to the vertebrobasilar junction without stenosis. Left vertebral artery dominant. Posterior inferior cerebral arteries patent proximally. Basilar artery widely patent to its distal aspect. Superior cerebral arteries patent bilaterally. Both of the posterior cerebral arteries supplied via the basilar and are well perfused to their distal aspects without stenosis. No aneurysm or vascular malformation. MRA NECK FINDINGS Source images reviewed. Visualized aortic arch of normal caliber with normal branch pattern. No flow-limiting stenosis about the origin of the great vessels. Partially visualized subclavian artery is widely patent without stenosis. Right common and internal carotid arteries widely patent without stenosis or occlusion. No significant atheromatous narrowing about the right carotid bifurcation. Left common and internal carotid artery is widely patent without stenosis or occlusion. No significant atheromatous narrowing about the left carotid bifurcation. Both of the vertebral arteries arise from the subclavian arteries. Left vertebral artery dominant. Right vertebral artery diffusely diminutive/hypoplastic. Vertebral arteries patent within the neck without stenosis or occlusion. IMPRESSION: MRI HEAD IMPRESSION: 1. No acute intracranial infarct or other abnormality identified. 2. Generalized age-related cerebral atrophy with moderate chronic small vessel ischemic disease. MRA HEAD IMPRESSION: Normal intracranial MRA. No evidence for large vessel occlusion. No high-grade or correctable stenosis. MRA NECK IMPRESSION: Normal MRA of the neck. No high-grade or critical flow limiting stenosis identified. Electronically Signed    By: Jeannine Boga M.D.   On: 03/21/2017 21:20   Mr Angiogram Neck W Or Wo Contrast  Result Date: 03/21/2017 CLINICAL DATA:  Initial evaluation for acute dizziness, syncope, ataxia, stroke suspected. EXAM: MRI HEAD WITHOUT AND WITH CONTRAST MRA HEAD WITHOUT CONTRAST MRA NECK WITHOUT AND WITH CONTRAST TECHNIQUE: Multiplanar, multiecho pulse sequences of the brain and surrounding structures were obtained without intravenous contrast. Angiographic images of the Circle of Willis were obtained using MRA technique without intravenous contrast. Angiographic images of the neck were obtained using MRA technique without and with intravenous contrast. Carotid stenosis measurements (when applicable) are obtained utilizing NASCET criteria, using the distal internal carotid diameter as the denominator. CONTRAST:  31mL MULTIHANCE GADOBENATE DIMEGLUMINE 529 MG/ML IV SOLN COMPARISON:  None. FINDINGS: MRI HEAD FINDINGS Mild diffuse prominence of the CSF containing spaces is compatible with generalized age related cerebral atrophy. Patchy and confluent T2/FLAIR hyperintensity within the periventricular and deep white matter both cerebral hemispheres most consistent with chronic small vessel ischemic disease, overall moderate nature. Chronic microvascular ischemic changes present within the pons. No abnormal foci of restricted diffusion to suggest acute or subacute ischemia. Gray-white matter differentiation maintained. No foci of susceptibility artifact to suggest acute or chronic intracranial hemorrhage. No encephalomalacia to suggest remote cortical infarction. No mass lesion, midline shift or mass effect. No hydrocephalus. No extra-axial fluid collection. Major dural sinuses are grossly patent. No abnormal enhancement. Pituitary gland suprasellar region normal. Midline structures intact and normal. Major intracranial vascular flow voids are maintained. Craniocervical junction within normal limits. Upper cervical spine  unremarkable. Bone marrow signal intensity within normal limits. No scalp soft tissue abnormality. Globes and oval soft tissues within normal limits. Paranasal sinuses are clear. No mastoid effusion. Inner ear structures normal. MRA HEAD FINDINGS ANTERIOR CIRCULATION: Distal cervical segments of the internal carotid arteries are widely patent with antegrade flow. Petrous, cavernous, and supraclinoid segments widely patent without stenosis. ICA termini widely patent. A1 segments patent bilaterally. Right A1 segment hypoplastic, which likely accounts for the slightly diminutive right ICA is compared  to the left. Normal anterior communicating artery. Anterior cerebral arteries widely patent to their distal aspects without stenosis. M1 segments patent without stenosis or occlusion. Normal MCA bifurcations. No proximal M2 occlusion. Distal MCA branches well perfused and symmetric. POSTERIOR CIRCULATION: Vertebral arteries patent to the vertebrobasilar junction without stenosis. Left vertebral artery dominant. Posterior inferior cerebral arteries patent proximally. Basilar artery widely patent to its distal aspect. Superior cerebral arteries patent bilaterally. Both of the posterior cerebral arteries supplied via the basilar and are well perfused to their distal aspects without stenosis. No aneurysm or vascular malformation. MRA NECK FINDINGS Source images reviewed. Visualized aortic arch of normal caliber with normal branch pattern. No flow-limiting stenosis about the origin of the great vessels. Partially visualized subclavian artery is widely patent without stenosis. Right common and internal carotid arteries widely patent without stenosis or occlusion. No significant atheromatous narrowing about the right carotid bifurcation. Left common and internal carotid artery is widely patent without stenosis or occlusion. No significant atheromatous narrowing about the left carotid bifurcation. Both of the vertebral arteries  arise from the subclavian arteries. Left vertebral artery dominant. Right vertebral artery diffusely diminutive/hypoplastic. Vertebral arteries patent within the neck without stenosis or occlusion. IMPRESSION: MRI HEAD IMPRESSION: 1. No acute intracranial infarct or other abnormality identified. 2. Generalized age-related cerebral atrophy with moderate chronic small vessel ischemic disease. MRA HEAD IMPRESSION: Normal intracranial MRA. No evidence for large vessel occlusion. No high-grade or correctable stenosis. MRA NECK IMPRESSION: Normal MRA of the neck. No high-grade or critical flow limiting stenosis identified. Electronically Signed   By: Jeannine Boga M.D.   On: 03/21/2017 21:20   Mr [place]i [place] And Wo Contrast  Result Date: 03/21/2017 CLINICAL DATA:  Initial evaluation for acute dizziness, syncope, ataxia, stroke suspected. EXAM: MRI HEAD WITHOUT AND WITH CONTRAST MRA HEAD WITHOUT CONTRAST MRA NECK WITHOUT AND WITH CONTRAST TECHNIQUE: Multiplanar, multiecho pulse sequences of the brain and surrounding structures were obtained without intravenous contrast. Angiographic images of the Circle of Willis were obtained using MRA technique without intravenous contrast. Angiographic images of the neck were obtained using MRA technique without and with intravenous contrast. Carotid stenosis measurements (when applicable) are obtained utilizing NASCET criteria, using the distal internal carotid diameter as the denominator. CONTRAST:  51mL MULTIHANCE GADOBENATE DIMEGLUMINE 529 MG/ML IV SOLN COMPARISON:  None. FINDINGS: MRI HEAD FINDINGS Mild diffuse prominence of the CSF containing spaces is compatible with generalized age related cerebral atrophy. Patchy and confluent T2/FLAIR hyperintensity within the periventricular and deep white matter both cerebral hemispheres most consistent with chronic small vessel ischemic disease, overall moderate nature. Chronic microvascular ischemic changes present within the pons.  No abnormal foci of restricted diffusion to suggest acute or subacute ischemia. Gray-white matter differentiation maintained. No foci of susceptibility artifact to suggest acute or chronic intracranial hemorrhage. No encephalomalacia to suggest remote cortical infarction. No mass lesion, midline shift or mass effect. No hydrocephalus. No extra-axial fluid collection. Major dural sinuses are grossly patent. No abnormal enhancement. Pituitary gland suprasellar region normal. Midline structures intact and normal. Major intracranial vascular flow voids are maintained. Craniocervical junction within normal limits. Upper cervical spine unremarkable. Bone marrow signal intensity within normal limits. No scalp soft tissue abnormality. Globes and oval soft tissues within normal limits. Paranasal sinuses are clear. No mastoid effusion. Inner ear structures normal. MRA HEAD FINDINGS ANTERIOR CIRCULATION: Distal cervical segments of the internal carotid arteries are widely patent with antegrade flow. Petrous, cavernous, and supraclinoid segments widely patent without stenosis. ICA termini widely patent. A1  segments patent bilaterally. Right A1 segment hypoplastic, which likely accounts for the slightly diminutive right ICA is compared to the left. Normal anterior communicating artery. Anterior cerebral arteries widely patent to their distal aspects without stenosis. M1 segments patent without stenosis or occlusion. Normal MCA bifurcations. No proximal M2 occlusion. Distal MCA branches well perfused and symmetric. POSTERIOR CIRCULATION: Vertebral arteries patent to the vertebrobasilar junction without stenosis. Left vertebral artery dominant. Posterior inferior cerebral arteries patent proximally. Basilar artery widely patent to its distal aspect. Superior cerebral arteries patent bilaterally. Both of the posterior cerebral arteries supplied via the basilar and are well perfused to their distal aspects without stenosis. No  aneurysm or vascular malformation. MRA NECK FINDINGS Source images reviewed. Visualized aortic arch of normal caliber with normal branch pattern. No flow-limiting stenosis about the origin of the great vessels. Partially visualized subclavian artery is widely patent without stenosis. Right common and internal carotid arteries widely patent without stenosis or occlusion. No significant atheromatous narrowing about the right carotid bifurcation. Left common and internal carotid artery is widely patent without stenosis or occlusion. No significant atheromatous narrowing about the left carotid bifurcation. Both of the vertebral arteries arise from the subclavian arteries. Left vertebral artery dominant. Right vertebral artery diffusely diminutive/hypoplastic. Vertebral arteries patent within the neck without stenosis or occlusion. IMPRESSION: MRI HEAD IMPRESSION: 1. No acute intracranial infarct or other abnormality identified. 2. Generalized age-related cerebral atrophy with moderate chronic small vessel ischemic disease. MRA HEAD IMPRESSION: Normal intracranial MRA. No evidence for large vessel occlusion. No high-grade or correctable stenosis. MRA NECK IMPRESSION: Normal MRA of the neck. No high-grade or critical flow limiting stenosis identified. Electronically Signed   By: Jeannine Boga M.D.   On: 03/21/2017 21:20   Dg Humerus Right  Result Date: 03/21/2017 CLINICAL DATA:  76 year old female with dizziness and fall today. Right side pain. EXAM: RIGHT HUMERUS - 2+ VIEW COMPARISON:  Chest CT 10/07/2016.  Chest radiographs 03/04/2015. FINDINGS: A small triangular ossific fragment at the right acromioclavicular joint is chronic and unchanged from the CT earlier this year. Grossly normal alignment at the right shoulder. Bone mineralization is within normal limits for age. The right humerus appears intact. Grossly normal alignment at the right elbow. Visible right ribs and lung parenchyma appear stable.  IMPRESSION: No acute fracture or dislocation identified about the right humerus. Electronically Signed   By: Genevie Ann M.D.   On: 03/21/2017 13:20   Dg Hip Unilat W Or Wo Pelvis 2-3 Views Right  Result Date: 03/21/2017 CLINICAL DATA:  Dizziness, fall, right side pain EXAM: DG HIP (WITH OR WITHOUT PELVIS) 2-3V RIGHT COMPARISON:  None FINDINGS: Hip joints and SI joints are symmetric and unremarkable. No acute bony abnormality. Specifically, no fracture, subluxation, or dislocation. Soft tissues are intact. IMPRESSION: No acute bony abnormality. Electronically Signed   By: Rolm Baptise M.D.   On: 03/21/2017 13:20     Caroline More, DO 03/22/2017, 12:03 PM PGY-1, [place] Intern pager: [phone], text pages welcome

## 2017-03-22 NOTE — Evaluation (Signed)
Occupational Therapy Evaluation Patient Details Name: Cassandra Brooks MRN: [id number] DOB: 02-13-1941 Today's Date: 03/22/2017    History of Present Illness Patient is a 76 y/o female who presents with dizziness and syncope episodes at home associated with slurred speech and shaking episodes. Head CT, Brain MRI/MRA unremarkable. CXR- bibasilar atelectasis. PMH includes HTN, AAA, glaucoma bil eyes, depression, fibromyalgia.    Clinical Impression   Pt reports she was independent with ADL PTA. Currently pt requires min assist for functional mobility and min guard-min assist for ADL due to balance deficits. Pt with c/o dizziness throughout mobility and unsteadiness noted. Educated pt on use of shower chair for safety and fall prevention during bathing. Pt planning to d/c home with 24/7 supervision from her husband. Pt would benefit from continued skilled OT to address established goals.    Follow Up Recommendations  No OT follow up;Supervision/Assistance - 24 hour    Equipment Recommendations  Tub/shower seat    Recommendations for Other Services       Precautions / Restrictions Precautions Precautions: Fall Precaution Comments: vertigo Restrictions Weight Bearing Restrictions: No      Mobility Bed Mobility Overal bed mobility: Needs Assistance Bed Mobility: Supine to Sit;Sit to Supine     Supine to sit: Min assist Sit to supine: Min guard   General bed mobility comments: Min HHA to boost trunk into upright sitting. Increased dizziness wtih positional changes. Increased time due to dizziness  Transfers Overall transfer level: Needs assistance Equipment used: Rolling walker (2 wheeled) Transfers: Sit to/from Stand Sit to Stand: Min assist         General transfer comment: For balance, no assist to boost up from EOB. Cues for hand placement with RW    Balance Overall balance assessment: Needs assistance;History of Falls Sitting-balance support: Feet  supported Sitting balance-Leahy Scale: Fair     Standing balance support: Bilateral upper extremity supported Standing balance-Leahy Scale: Poor                             ADL either performed or assessed with clinical judgement   ADL Overall ADL's : Needs assistance/impaired Eating/Feeding: Set up;Sitting   Grooming: Min guard;Sitting   Upper Body Bathing: Min guard;Sitting   Lower Body Bathing: Minimal assistance;Sit to/from stand   Upper Body Dressing : Min guard;Sitting   Lower Body Dressing: Minimal assistance;Sit to/from stand   Toilet Transfer: Minimal assistance;Ambulation;RW Toilet Transfer Details (indicate cue type and reason): Simulated by sit to stand from EOB with functional mobility in room       Tub/Shower Transfer Details (indicate cue type and reason): Educated on use of shower chair for safety with bathing to reduce risk of falls Functional mobility during ADLs: Minimal assistance;Rolling walker       Vision   Additional Comments: Needs further assessment     Perception     Praxis      Pertinent Vitals/Pain Pain Assessment: Faces Faces Pain Scale: No hurt     Hand Dominance     Extremity/Trunk Assessment Upper Extremity Assessment Upper Extremity Assessment: Overall WFL for tasks assessed   Lower Extremity Assessment Lower Extremity Assessment: Defer to PT evaluation   Cervical / Trunk Assessment Cervical / Trunk Assessment: Kyphotic   Communication Communication Communication: No difficulties   Cognition Arousal/Alertness: Awake/alert Behavior During Therapy: WFL for tasks assessed/performed Overall Cognitive Status: Within Functional Limits for tasks assessed  General Comments       Exercises     Shoulder Instructions      Home Living Family/patient expects to be discharged to:: Private residence Living Arrangements: Spouse/significant other Available Help  at Discharge: Family;Available 24 hours/day Type of Home: House Home Access: Stairs to enter [employer] of Steps: 2 Entrance Stairs-Rails: None Home Layout: One level;Laundry or work area in basement     [employer]: Occupational psychologist: Handicapped height     Home Equipment: [employer] - single point          Prior Functioning/Environment Level of Independence: Independent        Comments: Holds onto furniture the last couple weeks due to dizziness.otherwise independent; reports multiple falls including down the stairs. Normally can do IADLs and ADLs but has been having more difficulty as dizziness has worsened. Does not drive.         OT Problem List: Decreased activity tolerance;Impaired balance (sitting and/or standing);Decreased knowledge of use of DME or AE      OT Treatment/Interventions: Self-care/ADL training;DME and/or AE instruction;Therapeutic activities;Patient/family education;Balance training    OT Goals(Current goals can be found in the care plan section) Acute Rehab OT Goals Patient Stated Goal: to make this dizziness go away OT Goal Formulation: With patient/family Time For Goal Achievement: 04/05/17 Potential to Achieve Goals: Good ADL Goals Pt Will Perform Grooming: with modified independence;standing Pt Will Perform Upper Body Bathing: with modified independence;sitting Pt Will Perform Lower Body Bathing: with modified independence;sit to/from stand Pt Will Transfer to Toilet: with modified independence;ambulating;regular height toilet Pt Will Perform Toileting - Clothing Manipulation and hygiene: with modified independence;sit to/from stand Pt Will Perform Tub/Shower Transfer: with modified independence;Shower transfer;ambulating;shower seat;rolling walker  OT Frequency: Min 2X/week   Barriers to D/C:            Co-evaluation              AM-PAC PT "6 Clicks" Daily Activity     Outcome Measure Help from  another person eating meals?: None Help from another person taking care of personal grooming?: A Little Help from another person toileting, which includes using toliet, bedpan, or urinal?: A Little Help from another person bathing (including washing, rinsing, drying)?: A Little Help from another person to put on and taking off regular upper body clothing?: A Little Help from another person to put on and taking off regular lower body clothing?: A Little 6 Click Score: 19   End of Session Equipment Utilized During Treatment: Gait belt;Rolling walker  Activity Tolerance: Patient tolerated treatment well Patient left: in bed;with call bell/phone within reach;with bed alarm set;with family/visitor present  OT Visit Diagnosis: Unsteadiness on feet (R26.81);History of falling (Z91.81)                Time: 5176-1607 OT Time Calculation (min): 15 min Charges:  OT General Charges $OT Visit: 1 Visit OT Evaluation $OT Eval Moderate Complexity: 1 Mod G-Codes: OT G-codes **NOT FOR INPATIENT CLASS** Functional Assessment Tool Used: Clinical judgement Functional Limitation: Self care Self Care Current Status (P7106): At least 20 percent but less than 40 percent impaired, limited or restricted Self Care Goal Status (Y6948): At least 1 percent but less than 20 percent impaired, limited or restricted   Mel Almond A. Ulice Brilliant, M.S., OTR/L Pager: [place] 03/22/2017, 3:36 PM

## 2017-03-23 LAB — BASIC METABOLIC PANEL
Anion gap: 10 (ref 5–15)
BUN: 15 mg/dL (ref 6–20)
CALCIUM: 8.8 mg/dL — AB (ref 8.9–10.3)
CO2: 24 mmol/L (ref 22–32)
CREATININE: 0.86 mg/dL (ref 0.44–1.00)
Chloride: 106 mmol/L (ref 101–111)
Glucose, Bld: 114 mg/dL — ABNORMAL HIGH (ref 65–99)
Potassium: 3.7 mmol/L (ref 3.5–5.1)
SODIUM: 140 mmol/L (ref 135–145)

## 2017-03-23 LAB — CBC
HCT: 44.7 % (ref 36.0–46.0)
HEMOGLOBIN: 14.6 g/dL (ref 12.0–15.0)
MCH: 34.6 pg — ABNORMAL HIGH (ref 26.0–34.0)
MCHC: 32.7 g/dL (ref 30.0–36.0)
MCV: 105.9 fL — ABNORMAL HIGH (ref 78.0–100.0)
PLATELETS: 177 10*3/uL (ref 150–400)
RBC: 4.22 MIL/uL (ref 3.87–5.11)
RDW: 13.4 % (ref 11.5–15.5)
WBC: 4.4 10*3/uL (ref 4.0–10.5)

## 2017-03-23 LAB — FOLATE RBC: HEMATOCRIT: 42.3 % (ref 34.0–46.6)

## 2017-03-23 LAB — GLUCOSE, CAPILLARY: GLUCOSE-CAPILLARY: 114 mg/dL — AB (ref 65–99)

## 2017-03-23 NOTE — Care Management Note (Signed)
Case Management Note  Patient Details  Name: Cassandra Brooks MRN: [id number] Date of Birth: 09/08/1940  Subjective/Objective:   Pt in with syncope. She is from home with her husband.                  Action/Plan: PT recommending [place] services. CM will meet with the patient and her spouse and provide choice. Pt also ordered a walker for home. CM following.  Expected Discharge Date:                  Expected Discharge Plan:  [place]  In-House Referral:     Discharge planning Services     Post Acute Care Choice:    Choice offered to:     DME Arranged:    DME Agency:     HH Arranged:    [place] Agency:     Status of Service:  In process, will continue to follow  If discussed at Long Length of Stay Meetings, dates discussed:    Additional Comments:  Pollie Friar, RN 03/23/2017, 2:36 PM

## 2017-03-23 NOTE — Progress Notes (Addendum)
Subjective: Orthostatics yesterday only included to 3 minutes.   Exam: Vitals:   03/23/17 0056 03/23/17 0440  BP: (!) 143/66 (!) 133/55  Pulse: 69 64  Resp: 20 18  Temp: 98.1 F (36.7 C) 98.4 F (36.9 C)  SpO2: 98% 98%   Gen: In bed, NAD Resp: non-labored breathing, no acute distress Abd: soft, nt  Neuro: MS: awake, alert.  KA:JGOT, no nystagmus.  Motor: MAEW Sensory:intact to LT Cerebellar: no appendicular ataxia, but doe sway significantly when sitting.   Pertinent Labs: Bmp - unremrkable  Impression: 76 yo F with dizziness/unstediness of unclear etiology. The postural predominance of the symptoms still makes me think that delayed orthostasis is a significant possibility, but will need to ensure this. I think that multiple system atrophy would also be a significant possibility.   Recommendations: 1) Orthostatic vital signs to 10 minutes 2) PT/OT  Roland Rack, MD [facility] [phone]  If 7pm- 7am, please page neurology on call as listed in [place].

## 2017-03-23 NOTE — Progress Notes (Signed)
Physical Therapy Treatment Patient Details Name: Cassandra Brooks MRN: [id number] DOB: 13-Oct-1940 Today's Date: 03/23/2017    History of Present Illness Patient is a 76 y/o female who presents with dizziness and syncope episodes at home associated with slurred speech and shaking episodes. Head CT, Brain MRI/MRA unremarkable. CXR- bibasilar atelectasis. PMH includes HTN, AAA, glaucoma bil eyes, depression, fibromyalgia.     PT Comments    Session focused on assessing vestibular deficits and discussing safety considerations with patient and family to prevent future falls. Orthostatics and vestibular screens performed (see Balance Section Below). No central vestibular findings and s/s do not appear to be BPPV at this time. From history and findings, syncope events likely orthostatic in nature with mild R sided hypofunction involvement. PT will continue to follow. Once medically cleared for d/c, recommending vestibular home health PT to maximize safety at home and improve balance. Of note: Husband concerned that since recent fall 12/08, the patients balance is getting worse. Pt poor historian and unable to affirm this.    Follow Up Recommendations  Home health PT;Supervision for mobility/OOB(Vestibular HHPT)     Equipment Recommendations  Rolling walker with 5" wheels    Recommendations for Other Services       Precautions / Restrictions Precautions Precautions: Fall Precaution Comments: orthostatic, vertigo Restrictions Weight Bearing Restrictions: No    Mobility  Bed Mobility Overal bed mobility: Needs Assistance Bed Mobility: Supine to Sit;Sit to Supine     Supine to sit: Min assist Sit to supine: Min guard   General bed mobility comments: Min HHA to boost trunk into upright sitting. Increased dizziness wtih positional changes. Increased time due to dizziness  Transfers Overall transfer level: Needs assistance Equipment used: Rolling walker (2 wheeled) Transfers: Sit  to/from Stand Sit to Stand: Min guard Stand pivot transfers: Min guard       General transfer comment: min guard for safety, able to tolerate standing without physical assistance for moderate amount of time with UE support on RW patient reports dizziness with standing up.  Ambulation/Gait Ambulation/Gait assistance: Min guard Ambulation Distance (Feet): 20 Feet Assistive device: Rolling walker (2 wheeled) Gait Pattern/deviations: Step-through pattern;Decreased stride length;Narrow base of support Gait velocity: decreased   General Gait Details: asked patient to walk in room then walk while turning head, no differences or LOB. min guard for safety and use of RW   Stairs            Wheelchair Mobility    Modified Rankin (Stroke Patients Only)       Balance Overall balance assessment: Needs assistance;History of Falls Sitting-balance support: Feet supported Sitting balance-Leahy Scale: Fair Sitting balance - Comments: Requires UE support secondary to feelings of dizziness/vertigo   Standing balance support: Bilateral upper extremity supported Standing balance-Leahy Scale: Poor Standing balance comment: Requires external support for standing balance due to vertigo/dizziness.         03/23/17 1649  Orthostatic Lying   BP- Lying 156/82  Pulse- Lying 75  Orthostatic Sitting  BP- Sitting (!) 164/96  Pulse- Sitting 88  Orthostatic Standing at 0 minutes  BP- Standing at 0 minutes 153/78  Pulse- Standing at 0 minutes 94  Orthostatic Standing at 3 minutes  BP- Standing at 3 minutes (!) 164/94  Pulse- Standing at 3 minutes 98     03/23/17 1616  Vestibular Assessment  General Observation Patient and husband report history of dizziness and imbalance for past year that comes and goes with motion around home requiring her to reach for  support. Patient has had several falls lately, most recently from rising from chair and taking a few steps before "blacking out" and falling  down. Pt does not recall hitting head but later reported having a bump on R side of head. Husband is concnered that since her most recent fall last sunday, patients movement is slower and less coordinated. Patient has a hard time describing her diziness but denies spinning of room. Patient also reports "swooshing" feeling in her ears when her eyes are closed.  Denies ringing in ear or any loss of hearing.   Symptom Behavior  Type of Dizziness "Funny feeling in head"  Frequency of Dizziness over last year come and goes quickly  Duration of Dizziness 10-60 seconds  Aggravating Factors No known aggravating factors;Sit to stand  Relieving Factors No known relieving factors  Occulomotor Exam  Spontaneous Absent  Gaze-induced Absent  Head shaking Horizontal Absent  Head Shaking Vertical Absent  Smooth Pursuits Intact  Saccades Intact  Comment Head Impulse Test (+) x1 to R side   Vestibulo-Occular Reflex  Comment (catch up saccade observed)  Auditory  Comments intact BLE  Other Tests  Tragal negative  Positional Testing  Sidelying Test Sidelying Right;Sidelying Left  Horizontal Canal Testing Horizontal Canal Right;Horizontal Canal Left  Sidelying Right  Sidelying Right Duration 60 seconds   Sidelying Right Symptoms No nystagmus  Horizontal Canal Right  Horizontal Canal Right Duration 60 seconds   Horizontal Canal Right Symptoms Normal  Horizontal Canal Left  Horizontal Canal Left Duration 60 seconds  Horizontal Canal Left Symptoms Normal  Cognition  Cognition Orientation Level Appropriate for developmental age  Positional Sensitivities  Sit to Supine 2  Supine to Sitting 1  Head Turning x 5 1                         Cognition Arousal/Alertness: Awake/alert Behavior During Therapy: WFL for tasks assessed/performed Overall Cognitive Status: Within Functional Limits for tasks assessed                                        Exercises      General  Comments General comments (skin integrity, edema, etc.): extensive time spent with husband and patient assessing history of falls and balance.       Pertinent Vitals/Pain Pain Assessment: Faces Faces Pain Scale: No hurt Pain Location: headache Pain Descriptors / Indicators: Headache;Aching;Dull Pain Intervention(s): Limited activity within patient's tolerance    Home Living                      Prior Function            PT Goals (current goals can now be found in the care plan section) Acute Rehab PT Goals Patient Stated Goal: to make this dizziness go away PT Goal Formulation: With patient/family Time For Goal Achievement: 04/05/17 Potential to Achieve Goals: Fair Progress towards PT goals: Progressing toward goals    Frequency    Min 3X/week      PT Plan Current plan remains appropriate    Co-evaluation              AM-PAC PT "6 Clicks" Daily Activity  Outcome Measure  Difficulty turning over in bed (including adjusting bedclothes, sheets and blankets)?: A Little Difficulty moving from lying on back to sitting on the side of the bed? :  A Little Difficulty sitting down on and standing up from a chair with arms (e.g., wheelchair, bedside commode, etc,.)?: A Little Help needed moving to and from a bed to chair (including a wheelchair)?: A Little Help needed walking in hospital room?: A Little Help needed climbing 3-5 steps with a railing? : A Lot 6 Click Score: 17    End of Session Equipment Utilized During Treatment: Gait belt Activity Tolerance: Treatment limited secondary to medical complications (Comment) Patient left: in bed;with call bell/phone within reach;with family/visitor present Nurse Communication: Mobility status PT Visit Diagnosis: Unsteadiness on feet (R26.81);History of falling (Z91.81);Other abnormalities of gait and mobility (R26.89);Difficulty in walking, not elsewhere classified (R26.2);Dizziness and giddiness (R42)     Time:  1039-1135(extensive time discussing patients history ) PT Time Calculation (min) (ACUTE ONLY): 56 min  Charges:  $Therapeutic Activity: 8-22 mins $Self Care/Home Management: 8-22                    G Codes:      Reinaldo Berber, PT, DPT Acute Rehab Services Pager: [phone]    Reinaldo Berber 03/23/2017, 6:26 PM

## 2017-03-24 ENCOUNTER — Inpatient Hospital Stay (HOSPITAL_COMMUNITY): Payer: Medicare Other

## 2017-03-24 ENCOUNTER — Encounter (HOSPITAL_COMMUNITY): Payer: Self-pay | Admitting: Physician Assistant

## 2017-03-24 DIAGNOSIS — R42 Dizziness and giddiness: Secondary | ICD-10-CM

## 2017-03-24 LAB — BASIC METABOLIC PANEL
Anion gap: 9 (ref 5–15)
BUN: 19 mg/dL (ref 6–20)
CALCIUM: 9 mg/dL (ref 8.9–10.3)
CHLORIDE: 104 mmol/L (ref 101–111)
CO2: 26 mmol/L (ref 22–32)
CREATININE: 0.93 mg/dL (ref 0.44–1.00)
GFR calc non Af Amer: 58 mL/min — ABNORMAL LOW (ref >60)
Glucose, Bld: 112 mg/dL — ABNORMAL HIGH (ref 65–99)
Potassium: 3.6 mmol/L (ref 3.5–5.1)
Sodium: 139 mmol/L (ref 135–145)

## 2017-03-24 LAB — CBC
HEMATOCRIT: 46 % (ref 36.0–46.0)
HEMOGLOBIN: 15.1 g/dL — AB (ref 12.0–15.0)
MCH: 34.6 pg — AB (ref 26.0–34.0)
MCHC: 32.8 g/dL (ref 30.0–36.0)
MCV: 105.3 fL — AB (ref 78.0–100.0)
Platelets: 180 10*3/uL (ref 150–400)
RBC: 4.37 MIL/uL (ref 3.87–5.11)
RDW: 13.2 % (ref 11.5–15.5)
WBC: 4.4 10*3/uL (ref 4.0–10.5)

## 2017-03-24 LAB — GLUCOSE, CAPILLARY: Glucose-Capillary: 118 mg/dL — ABNORMAL HIGH (ref 65–99)

## 2017-03-24 MED ORDER — GABAPENTIN 300 MG PO CAPS
300.0000 mg | ORAL_CAPSULE | Freq: Every day | ORAL | Status: DC
  Administered 2017-03-24 – 2017-03-26 (×3): 300 mg via ORAL
  Filled 2017-03-24 (×3): qty 1

## 2017-03-24 MED ORDER — VENLAFAXINE HCL ER 75 MG PO CP24
150.0000 mg | ORAL_CAPSULE | Freq: Every day | ORAL | Status: DC
  Administered 2017-03-25 – 2017-03-27 (×3): 150 mg via ORAL
  Filled 2017-03-24 (×3): qty 2

## 2017-03-24 MED ORDER — GABAPENTIN 100 MG PO CAPS
100.0000 mg | ORAL_CAPSULE | Freq: Two times a day (BID) | ORAL | Status: DC
  Administered 2017-03-25 – 2017-03-27 (×5): 100 mg via ORAL
  Filled 2017-03-24 (×6): qty 1

## 2017-03-24 NOTE — Progress Notes (Signed)
Pt returned from MRI. VS stable. Pt alert, oriented, and calm. No concerns at this time. Telemetry reapplied

## 2017-03-24 NOTE — Progress Notes (Signed)
Family Medicine Teaching Service Daily Progress Note Intern Pager: [phone]  Patient name: Cassandra Brooks Medical record number: [id number] Date of birth: Aug 30, 1940 Age: 76 y.o. Gender: female  Primary Care Provider: Lowella Dandy, NP Consultants: neurology, cardiology  Code Status: full   Pt Overview and Major Events to Date:  Admitted to [facility] on 03/21/17  Assessment and Plan: Laurene Montgomeryis a 76 y.o.femalepresenting with dizziness and syncope. PMH is significant forHTN, glaucoma, GERD, HLD, OA, and anxiety.  Syncopal episodes Per patient she has had at least 4 syncopal episodes over the past several days. Prior to this she has had a 2 month history of dizziness that has been worsening. She also endorses "shaking" episodes that occur frequently without LOC. Differential diagnosis includesvasovagalsyncope, vertigo, posterior circulation insufficiency, possiblyseizureactivity, or psychogenic. Head Ct negative. Patient'sorthostatic vitalsnegative in ED.Neuro exam largely normal except for focal left leg weakness, which is likelypolio as child andpossiblypost-polio syndrome.No prodrome for syncopal episodes which is concerning. MRI showed no acute intracranal abnormality, MRA head and neck normal. EKG unchanged from admission. TSH wnl. EEG did not show epileptiform activity. Troponin neg x 3. Echo showing EF 60-65%, normal wall motion, grade 1 diastolic dysfunction, mild mitral/tricuspid regurgitation.  -monitor on telemetry -syncopal work up: echo pending  -PT/OT consult-recommend [facility] -neurology consulted, appreciate recommendations. Recommend orthostatic vital signs w/ 3-min, 5-min, and 10-min delayed standing vitals. If orthostatics normal will obtain tilt table test. Could consider conservative measures.  -cardiology consulted for event monitor as outpatient  -will decrease gabapentin to 300mg  at bedtime, and 100mg  bid with breakfast and lunch  -cardiology consulted as  inpatient, appreciate recommendations  Macrocytosis Seen on labs, MCV 105.3on admission. MCV today of 105.9. Hemoglobin elevated to 15.1. Patient denies alcohol use. She has been on long term prilosec. TSH wnl. B12 wnl. Folate wnl.  -consider outpatient work up   HTN Current BP 144/76. Home meds: amlodipine  -continue norvasc 10mg , continue HCTZ -monitor BP  HLD Home meds: pravachol 20 mg -continuehome medication  Anxiety Home meds: 225 mg effexor daily. Anxiety possibly contributing to "shaking episodes" -continue home med  Glaucoma Home meds: cosopt eye drops -continue home eye drops  GERD Home meds: prilosec daily -continue PPI  OAof spine, hips, knees Home meds: nabumetone at home -hold NSAIDdue to mildly elevated Cr, restart as able -tylenol as needed for pain  FEN/GI:regular diet Prophylaxis:lovenox  Disposition: pending neurology consultation, home with home health on discharge   Subjective:  Patient today feels well and ready for discharge. Reports continued dizziness both when sitting and when standing. Patient states that she started venlafaxine 20 years ago but increased dose 3 weeks ago when her dizziness and shaking spells begna.   Objective: Temp:  [97.9 F (36.6 C)-98.3 F (36.8 C)] 97.9 F (36.6 C) (12/14 1016) Pulse Rate:  [64-81] 64 (12/14 1016) Resp:  [17-18] 18 (12/14 1016) BP: (144-179)/(68-80) 148/68 (12/14 1016) SpO2:  [94 %-98 %] 98 % (12/14 0505) Physical Exam: General: awake and alert, NAD, laying in bed  Cardiovascular: RRR, no MRG  Respiratory: CTAB, no wheezes, rales or rhonchi  Abdomen: soft, non tender, non distended, bowel sounds normal  Extremities: non tender Neuro: CN2-12 intact, sensation intact bilaterally, 4/5 muscle strength in LLE, 5/5 in RLE, 5/5 muscle strength in upper extremities bilaterally, no dysmetria   Laboratory: Recent Labs  Lab 03/22/17 0336 03/23/17 0453 03/24/17 0411  WBC 4.5 4.4 4.4   HGB 13.7 14.6 15.1*  HCT 41.9  42.3 44.7 46.0  PLT 186 177  180   Recent Labs  Lab 03/22/17 0336 03/23/17 0453 03/24/17 0411  NA 140 140 139  K 3.6 3.7 3.6  CL 106 106 104  CO2 23 24 26   BUN 15 15 19   CREATININE 0.85 0.86 0.93  CALCIUM 8.5* 8.8* 9.0  GLUCOSE 101* 114* 112*    CBG (last 3)  Recent Labs    03/22/17 0614 03/23/17 0606 03/24/17 0610  GLUCAP 104* 114* 118*     Imaging/Diagnostic Tests: Dg Chest 2 View  Result Date: 03/21/2017 CLINICAL DATA:  Dizziness, fall EXAM: CHEST  2 VIEW COMPARISON:  Chest CT 10/07/2016 FINDINGS: Mild peribronchial thickening. Bibasilar atelectasis. Heart is normal size. No effusions or acute bony abnormality. IMPRESSION: Mild bronchitic changes, bibasilar atelectasis. Electronically Signed   By: Rolm Baptise M.D.   On: 03/21/2017 13:18   Ct Head Wo Contrast  Result Date: 03/21/2017 CLINICAL DATA:  76 year old female with multiple syncopal episodes and falls. Denies headache. Initial encounter. EXAM: CT HEAD WITHOUT CONTRAST TECHNIQUE: Contiguous axial images were obtained from the base of the skull through the vertex without intravenous contrast. COMPARISON:  None. FINDINGS: Brain: No intracranial hemorrhage or CT evidence of large acute infarct. Mild chronic microvascular changes. Mild global atrophy without hydrocephalus. No intracranial mass lesion noted on this unenhanced exam. Vascular: Vascular calcifications Skull: No skull fracture Sinuses/Orbits: No acute orbital abnormality. Visualized paranasal sinuses are clear. Other: Mastoid air cells and middle ear cavities are clear. Temporomandibular joint degenerative changes. IMPRESSION: Normal for age unenhanced head CT. Electronically Signed   By: Genia Del M.D.   On: 03/21/2017 13:06   Mr Angiogram Head W Or Wo Contrast  Result Date: 03/21/2017 CLINICAL DATA:  Initial evaluation for acute dizziness, syncope, ataxia, stroke suspected. EXAM: MRI HEAD WITHOUT AND WITH CONTRAST MRA  HEAD WITHOUT CONTRAST MRA NECK WITHOUT AND WITH CONTRAST TECHNIQUE: Multiplanar, multiecho pulse sequences of the brain and surrounding structures were obtained without intravenous contrast. Angiographic images of the Circle of Willis were obtained using MRA technique without intravenous contrast. Angiographic images of the neck were obtained using MRA technique without and with intravenous contrast. Carotid stenosis measurements (when applicable) are obtained utilizing NASCET criteria, using the distal internal carotid diameter as the denominator. CONTRAST:  66mL MULTIHANCE GADOBENATE DIMEGLUMINE 529 MG/ML IV SOLN COMPARISON:  None. FINDINGS: MRI HEAD FINDINGS Mild diffuse prominence of the CSF containing spaces is compatible with generalized age related cerebral atrophy. Patchy and confluent T2/FLAIR hyperintensity within the periventricular and deep white matter both cerebral hemispheres most consistent with chronic small vessel ischemic disease, overall moderate nature. Chronic microvascular ischemic changes present within the pons. No abnormal foci of restricted diffusion to suggest acute or subacute ischemia. Gray-white matter differentiation maintained. No foci of susceptibility artifact to suggest acute or chronic intracranial hemorrhage. No encephalomalacia to suggest remote cortical infarction. No mass lesion, midline shift or mass effect. No hydrocephalus. No extra-axial fluid collection. Major dural sinuses are grossly patent. No abnormal enhancement. Pituitary gland suprasellar region normal. Midline structures intact and normal. Major intracranial vascular flow voids are maintained. Craniocervical junction within normal limits. Upper cervical spine unremarkable. Bone marrow signal intensity within normal limits. No scalp soft tissue abnormality. Globes and oval soft tissues within normal limits. Paranasal sinuses are clear. No mastoid effusion. Inner ear structures normal. MRA HEAD FINDINGS ANTERIOR  CIRCULATION: Distal cervical segments of the internal carotid arteries are widely patent with antegrade flow. Petrous, cavernous, and supraclinoid segments widely patent without stenosis. ICA termini widely patent. A1 segments patent  bilaterally. Right A1 segment hypoplastic, which likely accounts for the slightly diminutive right ICA is compared to the left. Normal anterior communicating artery. Anterior cerebral arteries widely patent to their distal aspects without stenosis. M1 segments patent without stenosis or occlusion. Normal MCA bifurcations. No proximal M2 occlusion. Distal MCA branches well perfused and symmetric. POSTERIOR CIRCULATION: Vertebral arteries patent to the vertebrobasilar junction without stenosis. Left vertebral artery dominant. Posterior inferior cerebral arteries patent proximally. Basilar artery widely patent to its distal aspect. Superior cerebral arteries patent bilaterally. Both of the posterior cerebral arteries supplied via the basilar and are well perfused to their distal aspects without stenosis. No aneurysm or vascular malformation. MRA NECK FINDINGS Source images reviewed. Visualized aortic arch of normal caliber with normal branch pattern. No flow-limiting stenosis about the origin of the great vessels. Partially visualized subclavian artery is widely patent without stenosis. Right common and internal carotid arteries widely patent without stenosis or occlusion. No significant atheromatous narrowing about the right carotid bifurcation. Left common and internal carotid artery is widely patent without stenosis or occlusion. No significant atheromatous narrowing about the left carotid bifurcation. Both of the vertebral arteries arise from the subclavian arteries. Left vertebral artery dominant. Right vertebral artery diffusely diminutive/hypoplastic. Vertebral arteries patent within the neck without stenosis or occlusion. IMPRESSION: MRI HEAD IMPRESSION: 1. No acute intracranial  infarct or other abnormality identified. 2. Generalized age-related cerebral atrophy with moderate chronic small vessel ischemic disease. MRA HEAD IMPRESSION: Normal intracranial MRA. No evidence for large vessel occlusion. No high-grade or correctable stenosis. MRA NECK IMPRESSION: Normal MRA of the neck. No high-grade or critical flow limiting stenosis identified. Electronically Signed   By: Jeannine Boga M.D.   On: 03/21/2017 21:20   Mr Angiogram Neck W Or Wo Contrast  Result Date: 03/21/2017 CLINICAL DATA:  Initial evaluation for acute dizziness, syncope, ataxia, stroke suspected. EXAM: MRI HEAD WITHOUT AND WITH CONTRAST MRA HEAD WITHOUT CONTRAST MRA NECK WITHOUT AND WITH CONTRAST TECHNIQUE: Multiplanar, multiecho pulse sequences of the brain and surrounding structures were obtained without intravenous contrast. Angiographic images of the Circle of Willis were obtained using MRA technique without intravenous contrast. Angiographic images of the neck were obtained using MRA technique without and with intravenous contrast. Carotid stenosis measurements (when applicable) are obtained utilizing NASCET criteria, using the distal internal carotid diameter as the denominator. CONTRAST:  53mL MULTIHANCE GADOBENATE DIMEGLUMINE 529 MG/ML IV SOLN COMPARISON:  None. FINDINGS: MRI HEAD FINDINGS Mild diffuse prominence of the CSF containing spaces is compatible with generalized age related cerebral atrophy. Patchy and confluent T2/FLAIR hyperintensity within the periventricular and deep white matter both cerebral hemispheres most consistent with chronic small vessel ischemic disease, overall moderate nature. Chronic microvascular ischemic changes present within the pons. No abnormal foci of restricted diffusion to suggest acute or subacute ischemia. Gray-white matter differentiation maintained. No foci of susceptibility artifact to suggest acute or chronic intracranial hemorrhage. No encephalomalacia to suggest  remote cortical infarction. No mass lesion, midline shift or mass effect. No hydrocephalus. No extra-axial fluid collection. Major dural sinuses are grossly patent. No abnormal enhancement. Pituitary gland suprasellar region normal. Midline structures intact and normal. Major intracranial vascular flow voids are maintained. Craniocervical junction within normal limits. Upper cervical spine unremarkable. Bone marrow signal intensity within normal limits. No scalp soft tissue abnormality. Globes and oval soft tissues within normal limits. Paranasal sinuses are clear. No mastoid effusion. Inner ear structures normal. MRA HEAD FINDINGS ANTERIOR CIRCULATION: Distal cervical segments of the internal carotid arteries are widely patent  with antegrade flow. Petrous, cavernous, and supraclinoid segments widely patent without stenosis. ICA termini widely patent. A1 segments patent bilaterally. Right A1 segment hypoplastic, which likely accounts for the slightly diminutive right ICA is compared to the left. Normal anterior communicating artery. Anterior cerebral arteries widely patent to their distal aspects without stenosis. M1 segments patent without stenosis or occlusion. Normal MCA bifurcations. No proximal M2 occlusion. Distal MCA branches well perfused and symmetric. POSTERIOR CIRCULATION: Vertebral arteries patent to the vertebrobasilar junction without stenosis. Left vertebral artery dominant. Posterior inferior cerebral arteries patent proximally. Basilar artery widely patent to its distal aspect. Superior cerebral arteries patent bilaterally. Both of the posterior cerebral arteries supplied via the basilar and are well perfused to their distal aspects without stenosis. No aneurysm or vascular malformation. MRA NECK FINDINGS Source images reviewed. Visualized aortic arch of normal caliber with normal branch pattern. No flow-limiting stenosis about the origin of the great vessels. Partially visualized subclavian artery  is widely patent without stenosis. Right common and internal carotid arteries widely patent without stenosis or occlusion. No significant atheromatous narrowing about the right carotid bifurcation. Left common and internal carotid artery is widely patent without stenosis or occlusion. No significant atheromatous narrowing about the left carotid bifurcation. Both of the vertebral arteries arise from the subclavian arteries. Left vertebral artery dominant. Right vertebral artery diffusely diminutive/hypoplastic. Vertebral arteries patent within the neck without stenosis or occlusion. IMPRESSION: MRI HEAD IMPRESSION: 1. No acute intracranial infarct or other abnormality identified. 2. Generalized age-related cerebral atrophy with moderate chronic small vessel ischemic disease. MRA HEAD IMPRESSION: Normal intracranial MRA. No evidence for large vessel occlusion. No high-grade or correctable stenosis. MRA NECK IMPRESSION: Normal MRA of the neck. No high-grade or critical flow limiting stenosis identified. Electronically Signed   By: Jeannine Boga M.D.   On: 03/21/2017 21:20   Mr Jeri Cos And Wo Contrast  Result Date: 03/21/2017 CLINICAL DATA:  Initial evaluation for acute dizziness, syncope, ataxia, stroke suspected. EXAM: MRI HEAD WITHOUT AND WITH CONTRAST MRA HEAD WITHOUT CONTRAST MRA NECK WITHOUT AND WITH CONTRAST TECHNIQUE: Multiplanar, multiecho pulse sequences of the brain and surrounding structures were obtained without intravenous contrast. Angiographic images of the Circle of Willis were obtained using MRA technique without intravenous contrast. Angiographic images of the neck were obtained using MRA technique without and with intravenous contrast. Carotid stenosis measurements (when applicable) are obtained utilizing NASCET criteria, using the distal internal carotid diameter as the denominator. CONTRAST:  58mL MULTIHANCE GADOBENATE DIMEGLUMINE 529 MG/ML IV SOLN COMPARISON:  None. FINDINGS: MRI HEAD  FINDINGS Mild diffuse prominence of the CSF containing spaces is compatible with generalized age related cerebral atrophy. Patchy and confluent T2/FLAIR hyperintensity within the periventricular and deep white matter both cerebral hemispheres most consistent with chronic small vessel ischemic disease, overall moderate nature. Chronic microvascular ischemic changes present within the pons. No abnormal foci of restricted diffusion to suggest acute or subacute ischemia. Gray-white matter differentiation maintained. No foci of susceptibility artifact to suggest acute or chronic intracranial hemorrhage. No encephalomalacia to suggest remote cortical infarction. No mass lesion, midline shift or mass effect. No hydrocephalus. No extra-axial fluid collection. Major dural sinuses are grossly patent. No abnormal enhancement. Pituitary gland suprasellar region normal. Midline structures intact and normal. Major intracranial vascular flow voids are maintained. Craniocervical junction within normal limits. Upper cervical spine unremarkable. Bone marrow signal intensity within normal limits. No scalp soft tissue abnormality. Globes and oval soft tissues within normal limits. Paranasal sinuses are clear. No mastoid effusion. Inner ear  structures normal. MRA HEAD FINDINGS ANTERIOR CIRCULATION: Distal cervical segments of the internal carotid arteries are widely patent with antegrade flow. Petrous, cavernous, and supraclinoid segments widely patent without stenosis. ICA termini widely patent. A1 segments patent bilaterally. Right A1 segment hypoplastic, which likely accounts for the slightly diminutive right ICA is compared to the left. Normal anterior communicating artery. Anterior cerebral arteries widely patent to their distal aspects without stenosis. M1 segments patent without stenosis or occlusion. Normal MCA bifurcations. No proximal M2 occlusion. Distal MCA branches well perfused and symmetric. POSTERIOR CIRCULATION:  Vertebral arteries patent to the vertebrobasilar junction without stenosis. Left vertebral artery dominant. Posterior inferior cerebral arteries patent proximally. Basilar artery widely patent to its distal aspect. Superior cerebral arteries patent bilaterally. Both of the posterior cerebral arteries supplied via the basilar and are well perfused to their distal aspects without stenosis. No aneurysm or vascular malformation. MRA NECK FINDINGS Source images reviewed. Visualized aortic arch of normal caliber with normal branch pattern. No flow-limiting stenosis about the origin of the great vessels. Partially visualized subclavian artery is widely patent without stenosis. Right common and internal carotid arteries widely patent without stenosis or occlusion. No significant atheromatous narrowing about the right carotid bifurcation. Left common and internal carotid artery is widely patent without stenosis or occlusion. No significant atheromatous narrowing about the left carotid bifurcation. Both of the vertebral arteries arise from the subclavian arteries. Left vertebral artery dominant. Right vertebral artery diffusely diminutive/hypoplastic. Vertebral arteries patent within the neck without stenosis or occlusion. IMPRESSION: MRI HEAD IMPRESSION: 1. No acute intracranial infarct or other abnormality identified. 2. Generalized age-related cerebral atrophy with moderate chronic small vessel ischemic disease. MRA HEAD IMPRESSION: Normal intracranial MRA. No evidence for large vessel occlusion. No high-grade or correctable stenosis. MRA NECK IMPRESSION: Normal MRA of the neck. No high-grade or critical flow limiting stenosis identified. Electronically Signed   By: Jeannine Boga M.D.   On: 03/21/2017 21:20   Dg Humerus Right  Result Date: 03/21/2017 CLINICAL DATA:  76 year old female with dizziness and fall today. Right side pain. EXAM: RIGHT HUMERUS - 2+ VIEW COMPARISON:  Chest CT 10/07/2016.  Chest  radiographs 03/04/2015. FINDINGS: A small triangular ossific fragment at the right acromioclavicular joint is chronic and unchanged from the CT earlier this year. Grossly normal alignment at the right shoulder. Bone mineralization is within normal limits for age. The right humerus appears intact. Grossly normal alignment at the right elbow. Visible right ribs and lung parenchyma appear stable. IMPRESSION: No acute fracture or dislocation identified about the right humerus. Electronically Signed   By: Genevie Ann M.D.   On: 03/21/2017 13:20   Dg Hip Unilat W Or Wo Pelvis 2-3 Views Right  Result Date: 03/21/2017 CLINICAL DATA:  Dizziness, fall, right side pain EXAM: DG HIP (WITH OR WITHOUT PELVIS) 2-3V RIGHT COMPARISON:  None FINDINGS: Hip joints and SI joints are symmetric and unremarkable. No acute bony abnormality. Specifically, no fracture, subluxation, or dislocation. Soft tissues are intact. IMPRESSION: No acute bony abnormality. Electronically Signed   By: Rolm Baptise M.D.   On: 03/21/2017 13:20    Caroline More, DO 03/24/2017, 11:50 AM PGY-1, [place] Intern pager: [phone], text pages welcome

## 2017-03-24 NOTE — Care Management Note (Signed)
Case Management Note  Patient Details  Name: Cassandra Brooks MRN: [id number] Date of Birth: [birth year]  Subjective/Objective:                    Action/Plan: Plan is for patient to d/c home with [facility] services when medically ready. CM met with the patient and her spouse and provided choice of HH. They selected [facility] ([facility] [place] notified and accepted the referral. Pt with orders for walker. Dan with [facility] notified and will deliver the equipment to the room. Husband to provide transportation home.   Expected Discharge Date:                  Expected Discharge Plan:  [place]  In-House Referral:     Discharge planning Services  CM Consult  Post Acute Care Choice:  Durable Medical Equipment, Home Health Choice offered to:  Patient  DME Arranged:  Walker rolling DME Agency:  [place] Arranged:  PT(vestibular) [place] Agency:  [place]  Status of Service:  Completed, signed off  If discussed at [place] of Stay Meetings, dates discussed:    Additional Comments:  Pollie Friar, RN 03/24/2017, 11:46 AM

## 2017-03-24 NOTE — Progress Notes (Signed)
Subjective: Patient reports no change in symptoms today. Continued dizziness that is worse with sitting up and with standing. Improves with lying flat but does not completely dissipate.   Exam: Vitals:   03/24/17 1016 03/24/17 1436  BP: (!) 148/68 137/66  Pulse: 64 69  Resp: 18 18  Temp: 97.9 F (36.6 C) 98.7 F (37.1 C)  SpO2:     Gen: In bed, NAD Resp: non-labored breathing, no acute distress Abd: soft, nt  Neuro: MS: awake, alert CN: EOMI, no nystagmus Motor: MAEW, negative rhomberg Sensory: intact to LT, intact proprioception   Impression:  76 year old female with dizziness of unclear etiology. Delayed orthostatics negative which makes multiple system atrophy unlikely. PSP remains on the differential. She does not have any cerebellar dysfunction on exam. She has not had any changes in her gabapentin does for years. Does note increase in her chronic Effexor dose but she reports that this was in response to the dizzy episodes to try to treat them and not preceding the dizzy episodes. May be atypical parkinsonian features.   Recommendations: 1) Will need to discuss with Dr. Carles Collet and consider outpatient referral at discharge.

## 2017-03-24 NOTE — Progress Notes (Signed)
OT Cancellation Note  Patient Details Name: Cassandra Brooks MRN: [id number] DOB: 1940-10-31   Cancelled Treatment:    Reason Eval/Treat Not Completed: Other (comment). MD is currently in room discussing with pt her care.   Almon Register [phone] 03/24/2017, 1:12 PM

## 2017-03-24 NOTE — Consult Note (Signed)
Cardiology Consultation:   Patient ID: Charlynn Salih; [id number]; 06-23-1940   Admit date: 03/21/2017 Date of Consult: 03/24/2017  Primary Care Provider: Lowella Dandy, NP Primary Cardiologist: new - Dr. Johnsie Cancel Primary Electrophysiologist:     Patient Profile:   Dinara Lupu is a 76 y.o. female with a hx of solitary pulmonary nodule, HTN, HLD, GERD, fibromyalgia, AAA, prediabetes, and depression who is being seen today for the evaluation of recurrent syncope at the request of Dr. Andria Frames. Of note, she has left leg weakness thought to be related to her childhood polio.  History of Present Illness:   Ms. Mcreynolds presented to [facility] after her 2nd syncopal episode over the preceding several days. Prior to these episodes, she had a 5-6 month history of dizziness that has been worsening. The first syncopal episode occurred last week when she was going downstairs to the basement. She states she thoguht she was at the last step, but didn't feel the step and then woke up on the floor. She has no recollection of the fall. She denies chest pain, SOB, and palpitations leading up to the event. The event was not witnessed, but her husband was home and found her on the floor.  The second event happened on the day of her arrival in the ED. She was walking to the kitchen, felt an overwhelming dizziness, and tried to grab the wall. She fell backwards and had another LOC. She hit her head. Her husband shook her awake. He immediately checked her pressure and it "was good."  On my interview, she reports occasional palpitations. She denies chest pain, SOB, and lower extremity edema. Neurology was consulted and head imaging was negative. EEG was negative for epileptiform activity. Echo normal, EKG without bradycardia or heart block. Labs WNL.  Past Medical History:  Diagnosis Date  . AAA (abdominal aortic aneurysm) ([place])   . Arthritis    "knees" (03/21/2017)  . Borderline diabetes   . Bulging lumbar disc     . Depression   . Fibromyalgia   . GERD (gastroesophageal reflux disease)   . Glaucoma, both eyes   . High cholesterol    "don't have it but I take RX" (03/21/2017)  . Hypertension   . Syncope and collapse     Past Surgical History:  Procedure Laterality Date  . ABDOMINAL AORTIC ANEURYSM REPAIR  1966  . APPENDECTOMY  1966  . VAGINAL HYSTERECTOMY     "partial"  . VIDEO BRONCHOSCOPY Bilateral 03/19/2015   Procedure: VIDEO BRONCHOSCOPY WITHOUT FLUORO;  Surgeon: Rigoberto Noel, MD;  Location: [facility];  Service: Cardiopulmonary;  Laterality: Bilateral;     Home Medications:  Prior to Admission medications   Medication Sig Start Date End Date Taking? Authorizing Provider  amLODipine (NORVASC) 10 MG tablet Take 10 mg by mouth daily.   Yes [provider]  Calcium-Magnesium-Vitamin D (CALCIUM 1200+D3 PO) Take 1 tablet by mouth daily.   Yes [provider]  dorzolamide-timolol (COSOPT) 22.3-6.8 MG/ML ophthalmic solution Place 2 drops into both eyes daily.    Yes [provider]  gabapentin (NEURONTIN) 600 MG tablet Take 300-600 mg by mouth See admin instructions. 300 mg in the morning and at lunch and 600 mg at nignt   Yes [provider]  Multiple Vitamin (MULTIVITAMIN) tablet Take 1 tablet by mouth daily.   Yes [provider]  nabumetone (RELAFEN) 750 MG tablet Take 750 mg by mouth 2 (two) times daily. 02/21/17  Yes [provider]  omeprazole ([place]) 20  MG capsule Take 20 mg by mouth daily.   Yes [provider]  pravastatin (PRAVACHOL) 20 MG tablet Take 20 mg by mouth daily.   Yes [provider]  tiZANidine (ZANAFLEX) 4 MG tablet TAKE 1 TABLET(4 MG) BY MOUTH EVERY 6 HOURS AS NEEDED FOR MUSCLE SPASMS 12/06/16  Yes Leandrew Koyanagi, MD  Travoprost, BAK Free, (TRAVATAN) 0.004 % SOLN ophthalmic solution Place 1 drop into both eyes at bedtime.   Yes [provider]  venlafaxine XR (EFFEXOR-XR) 150 MG 24 hr  capsule Take 150 mg by mouth See admin instructions. Take 75 mg and 150 mg=225 mg daily   Yes [provider]  venlafaxine XR (EFFEXOR-XR) 75 MG 24 hr capsule Take 75 mg by mouth See admin instructions. Patient takes 150 mg and 75 mg for a total of 225 mg   Yes [provider]    Inpatient Medications: Scheduled Meds: . amLODipine  10 mg Oral Daily  . dorzolamide-timolol  2 drop Both Eyes Daily  . enoxaparin (LOVENOX) injection  40 mg Subcutaneous Q24H  . gabapentin  100 mg Oral BID WC  . gabapentin  300 mg Oral QHS  . hydrochlorothiazide  12.5 mg Oral Daily  . latanoprost  1 drop Both Eyes QHS  . multivitamin with minerals  1 tablet Oral Daily  . pantoprazole  40 mg Oral Daily  . pravastatin  20 mg Oral q1800  . sodium chloride flush  3 mL Intravenous Q12H  . venlafaxine XR  225 mg Oral Q breakfast   Continuous Infusions:  PRN Meds: acetaminophen **OR** acetaminophen, ondansetron **OR** ondansetron (ZOFRAN) IV, polyethylene glycol  Allergies:   No Known Allergies  Social History:   Social History   Socioeconomic History  . Marital status: Married    Spouse name: Not on file  . Number of children: Not on file  . Years of education: Not on file  . Highest education level: Not on file  Social Needs  . Financial resource strain: Not on file  . Food insecurity - worry: Not on file  . Food insecurity - inability: Not on file  . Transportation needs - medical: Not on file  . Transportation needs - non-medical: Not on file  Occupational History  . Not on file  Tobacco Use  . Smoking status: Never Smoker  . Smokeless tobacco: Never Used  Substance and Sexual Activity  . Alcohol use: No  . Drug use: No  . Sexual activity: Not on file  Other Topics Concern  . Not on file  Social History Narrative  . Not on file    Family History:    Family History  Problem Relation Age of Onset  . Hypertension Father      ROS:  Please see the history of present  illness.  ROS  All other ROS reviewed and negative.     Physical Exam/Data:   Vitals:   03/23/17 2115 03/24/17 0112 03/24/17 0505 03/24/17 1016  BP: (!) 179/71 (!) 159/73 (!) 144/76 (!) 148/68  Pulse: 64 66 69 64  Resp: 18 18 18 18   Temp: 98 F (36.7 C) 98.3 F (36.8 C) 98.1 F (36.7 C) 97.9 F (36.6 C)  TempSrc: Oral Oral Oral Oral  SpO2: 94% 96% 98%   Weight:      Height:        Intake/Output Summary (Last 24 hours) at 03/24/2017 1328 Last data filed at 03/24/2017 1300 Gross per 24 hour  Intake 480 ml  Output -  Net 480 ml   Filed Weights   03/21/17 1630  Weight: 172 lb 2.9 oz (78.1 kg)   Body mass index is 31.49 kg/m.  General:  Well nourished, well developed, in no acute distress HEENT: normal Neck: no JVD Vascular: No carotid bruits  Cardiac:  normal S1, S2; RRR; no murmur Lungs:  clear to auscultation bilaterally, no wheezing, rhonchi or rales  Abd: soft, nontender, no hepatomegaly  Ext: no edema Musculoskeletal:  No deformities, BUE and BLE strength normal and equal Skin: warm and dry  Neuro:  CNs 2-12 intact, no focal abnormalities noted Psych:  Normal affect   EKG:  The EKG was personally reviewed and demonstrates:  Sinus rhythm Telemetry:  Telemetry was personally reviewed and demonstrates:  NSR  Relevant CV Studies:  Echo 03/22/17: Study Conclusions - Left ventricle: The cavity size was normal. Wall thickness was   increased in a pattern of moderate LVH. Systolic function was   normal. The estimated ejection fraction was in the range of 60%   to 65%. Wall motion was normal; there were no regional wall   motion abnormalities. Doppler parameters are consistent with   abnormal left ventricular relaxation (grade 1 diastolic   dysfunction). The E/e&' ratio is between 8-15, suggesting   indeterminate LV filling pressure. - Mitral valve: Mildly thickened leaflets . There was mild   regurgitation. - Left atrium: The atrium was normal in size. -  Tricuspid valve: There was mild regurgitation. - Pulmonary arteries: PA peak pressure: 42 mm Hg (S). - Inferior vena cava: The vessel was normal in size. The   respirophasic diameter changes were in the normal range (>= 50%),   consistent with normal central venous pressure.  Impressions: - LVEF 60-65%, moderate LVH, normal wall motion, grade 1 DD,   indeterminate LV filling pressure, mild MR, normal LA size, mild   TR, RVSP 42 mmHg, normal IVC.   Laboratory Data:  Chemistry Recent Labs  Lab 03/22/17 0336 03/23/17 0453 03/24/17 0411  NA 140 140 139  K 3.6 3.7 3.6  CL 106 106 104  CO2 23 24 26   GLUCOSE 101* 114* 112*  BUN 15 15 19   CREATININE 0.85 0.86 0.93  CALCIUM 8.5* 8.8* 9.0  GFRNONAA >60 >60 58*  GFRAA >60 >60 >60  ANIONGAP 11 10 9     No results for input(s): PROT, ALBUMIN, AST, ALT, ALKPHOS, BILITOT in the last 168 hours. Hematology Recent Labs  Lab 03/22/17 0336 03/23/17 0453 03/24/17 0411  WBC 4.5 4.4 4.4  RBC 3.99 4.22 4.37  HGB 13.7 14.6 15.1*  HCT 41.9  42.3 44.7 46.0  MCV 105.0* 105.9* 105.3*  MCH 34.3* 34.6* 34.6*  MCHC 32.7 32.7 32.8  RDW 13.2 13.4 13.2  PLT 186 177 180   Cardiac Enzymes Recent Labs  Lab 03/22/17 1201 03/22/17 1641 03/22/17 2239  TROPONINI <0.03 <0.03 <0.03    Recent Labs  Lab 03/21/17 1246  TROPIPOC 0.00    BNPNo results for input(s): BNP, PROBNP in the last 168 hours.  DDimer No results for input(s): DDIMER in the last 168 hours.  Radiology/Studies:  Dg Chest 2 View  Result Date: 03/21/2017 CLINICAL DATA:  Dizziness, fall EXAM: CHEST  2 VIEW COMPARISON:  Chest CT 10/07/2016 FINDINGS: Mild peribronchial thickening. Bibasilar atelectasis. Heart is normal size. No effusions or acute bony abnormality. IMPRESSION: Mild bronchitic changes, bibasilar atelectasis. Electronically Signed   By: Rolm Baptise M.D.   On: 03/21/2017 13:18   Ct Head Wo Contrast  Result  Date: 03/21/2017 CLINICAL DATA:  76 year old female with  multiple syncopal episodes and falls. Denies headache. Initial encounter. EXAM: CT HEAD WITHOUT CONTRAST TECHNIQUE: Contiguous axial images were obtained from the base of the skull through the vertex without intravenous contrast. COMPARISON:  None. FINDINGS: Brain: No intracranial hemorrhage or CT evidence of large acute infarct. Mild chronic microvascular changes. Mild global atrophy without hydrocephalus. No intracranial mass lesion noted on this unenhanced exam. Vascular: Vascular calcifications Skull: No skull fracture Sinuses/Orbits: No acute orbital abnormality. Visualized paranasal sinuses are clear. Other: Mastoid air cells and middle ear cavities are clear. Temporomandibular joint degenerative changes. IMPRESSION: Normal for age unenhanced head CT. Electronically Signed   By: Genia Del M.D.   On: 03/21/2017 13:06   Mr Angiogram Head W Or Wo Contrast  Result Date: 03/21/2017 CLINICAL DATA:  Initial evaluation for acute dizziness, syncope, ataxia, stroke suspected. EXAM: MRI HEAD WITHOUT AND WITH CONTRAST MRA HEAD WITHOUT CONTRAST MRA NECK WITHOUT AND WITH CONTRAST TECHNIQUE: Multiplanar, multiecho pulse sequences of the brain and surrounding structures were obtained without intravenous contrast. Angiographic images of the Circle of Willis were obtained using MRA technique without intravenous contrast. Angiographic images of the neck were obtained using MRA technique without and with intravenous contrast. Carotid stenosis measurements (when applicable) are obtained utilizing NASCET criteria, using the distal internal carotid diameter as the denominator. CONTRAST:  34mL MULTIHANCE GADOBENATE DIMEGLUMINE 529 MG/ML IV SOLN COMPARISON:  None. FINDINGS: MRI HEAD FINDINGS Mild diffuse prominence of the CSF containing spaces is compatible with generalized age related cerebral atrophy. Patchy and confluent T2/FLAIR hyperintensity within the periventricular and deep white matter both cerebral hemispheres most  consistent with chronic small vessel ischemic disease, overall moderate nature. Chronic microvascular ischemic changes present within the pons. No abnormal foci of restricted diffusion to suggest acute or subacute ischemia. Gray-white matter differentiation maintained. No foci of susceptibility artifact to suggest acute or chronic intracranial hemorrhage. No encephalomalacia to suggest remote cortical infarction. No mass lesion, midline shift or mass effect. No hydrocephalus. No extra-axial fluid collection. Major dural sinuses are grossly patent. No abnormal enhancement. Pituitary gland suprasellar region normal. Midline structures intact and normal. Major intracranial vascular flow voids are maintained. Craniocervical junction within normal limits. Upper cervical spine unremarkable. Bone marrow signal intensity within normal limits. No scalp soft tissue abnormality. Globes and oval soft tissues within normal limits. Paranasal sinuses are clear. No mastoid effusion. Inner ear structures normal. MRA HEAD FINDINGS ANTERIOR CIRCULATION: Distal cervical segments of the internal carotid arteries are widely patent with antegrade flow. Petrous, cavernous, and supraclinoid segments widely patent without stenosis. ICA termini widely patent. A1 segments patent bilaterally. Right A1 segment hypoplastic, which likely accounts for the slightly diminutive right ICA is compared to the left. Normal anterior communicating artery. Anterior cerebral arteries widely patent to their distal aspects without stenosis. M1 segments patent without stenosis or occlusion. Normal MCA bifurcations. No proximal M2 occlusion. Distal MCA branches well perfused and symmetric. POSTERIOR CIRCULATION: Vertebral arteries patent to the vertebrobasilar junction without stenosis. Left vertebral artery dominant. Posterior inferior cerebral arteries patent proximally. Basilar artery widely patent to its distal aspect. Superior cerebral arteries patent  bilaterally. Both of the posterior cerebral arteries supplied via the basilar and are well perfused to their distal aspects without stenosis. No aneurysm or vascular malformation. MRA NECK FINDINGS Source images reviewed. Visualized aortic arch of normal caliber with normal branch pattern. No flow-limiting stenosis about the origin of the great vessels. Partially visualized subclavian artery is widely patent without  stenosis. Right common and internal carotid arteries widely patent without stenosis or occlusion. No significant atheromatous narrowing about the right carotid bifurcation. Left common and internal carotid artery is widely patent without stenosis or occlusion. No significant atheromatous narrowing about the left carotid bifurcation. Both of the vertebral arteries arise from the subclavian arteries. Left vertebral artery dominant. Right vertebral artery diffusely diminutive/hypoplastic. Vertebral arteries patent within the neck without stenosis or occlusion. IMPRESSION: MRI HEAD IMPRESSION: 1. No acute intracranial infarct or other abnormality identified. 2. Generalized age-related cerebral atrophy with moderate chronic small vessel ischemic disease. MRA HEAD IMPRESSION: Normal intracranial MRA. No evidence for large vessel occlusion. No high-grade or correctable stenosis. MRA NECK IMPRESSION: Normal MRA of the neck. No high-grade or critical flow limiting stenosis identified. Electronically Signed   By: Jeannine Boga M.D.   On: 03/21/2017 21:20   Mr Angiogram Neck W Or Wo Contrast  Result Date: 03/21/2017 CLINICAL DATA:  Initial evaluation for acute dizziness, syncope, ataxia, stroke suspected. EXAM: MRI HEAD WITHOUT AND WITH CONTRAST MRA HEAD WITHOUT CONTRAST MRA NECK WITHOUT AND WITH CONTRAST TECHNIQUE: Multiplanar, multiecho pulse sequences of the brain and surrounding structures were obtained without intravenous contrast. Angiographic images of the Circle of Willis were obtained using MRA  technique without intravenous contrast. Angiographic images of the neck were obtained using MRA technique without and with intravenous contrast. Carotid stenosis measurements (when applicable) are obtained utilizing NASCET criteria, using the distal internal carotid diameter as the denominator. CONTRAST:  2mL MULTIHANCE GADOBENATE DIMEGLUMINE 529 MG/ML IV SOLN COMPARISON:  None. FINDINGS: MRI HEAD FINDINGS Mild diffuse prominence of the CSF containing spaces is compatible with generalized age related cerebral atrophy. Patchy and confluent T2/FLAIR hyperintensity within the periventricular and deep white matter both cerebral hemispheres most consistent with chronic small vessel ischemic disease, overall moderate nature. Chronic microvascular ischemic changes present within the pons. No abnormal foci of restricted diffusion to suggest acute or subacute ischemia. Gray-white matter differentiation maintained. No foci of susceptibility artifact to suggest acute or chronic intracranial hemorrhage. No encephalomalacia to suggest remote cortical infarction. No mass lesion, midline shift or mass effect. No hydrocephalus. No extra-axial fluid collection. Major dural sinuses are grossly patent. No abnormal enhancement. Pituitary gland suprasellar region normal. Midline structures intact and normal. Major intracranial vascular flow voids are maintained. Craniocervical junction within normal limits. Upper cervical spine unremarkable. Bone marrow signal intensity within normal limits. No scalp soft tissue abnormality. Globes and oval soft tissues within normal limits. Paranasal sinuses are clear. No mastoid effusion. Inner ear structures normal. MRA HEAD FINDINGS ANTERIOR CIRCULATION: Distal cervical segments of the internal carotid arteries are widely patent with antegrade flow. Petrous, cavernous, and supraclinoid segments widely patent without stenosis. ICA termini widely patent. A1 segments patent bilaterally. Right A1  segment hypoplastic, which likely accounts for the slightly diminutive right ICA is compared to the left. Normal anterior communicating artery. Anterior cerebral arteries widely patent to their distal aspects without stenosis. M1 segments patent without stenosis or occlusion. Normal MCA bifurcations. No proximal M2 occlusion. Distal MCA branches well perfused and symmetric. POSTERIOR CIRCULATION: Vertebral arteries patent to the vertebrobasilar junction without stenosis. Left vertebral artery dominant. Posterior inferior cerebral arteries patent proximally. Basilar artery widely patent to its distal aspect. Superior cerebral arteries patent bilaterally. Both of the posterior cerebral arteries supplied via the basilar and are well perfused to their distal aspects without stenosis. No aneurysm or vascular malformation. MRA NECK FINDINGS Source images reviewed. Visualized aortic arch of normal caliber with normal branch  pattern. No flow-limiting stenosis about the origin of the great vessels. Partially visualized subclavian artery is widely patent without stenosis. Right common and internal carotid arteries widely patent without stenosis or occlusion. No significant atheromatous narrowing about the right carotid bifurcation. Left common and internal carotid artery is widely patent without stenosis or occlusion. No significant atheromatous narrowing about the left carotid bifurcation. Both of the vertebral arteries arise from the subclavian arteries. Left vertebral artery dominant. Right vertebral artery diffusely diminutive/hypoplastic. Vertebral arteries patent within the neck without stenosis or occlusion. IMPRESSION: MRI HEAD IMPRESSION: 1. No acute intracranial infarct or other abnormality identified. 2. Generalized age-related cerebral atrophy with moderate chronic small vessel ischemic disease. MRA HEAD IMPRESSION: Normal intracranial MRA. No evidence for large vessel occlusion. No high-grade or correctable  stenosis. MRA NECK IMPRESSION: Normal MRA of the neck. No high-grade or critical flow limiting stenosis identified. Electronically Signed   By: Jeannine Boga M.D.   On: 03/21/2017 21:20   Mr Jeri Cos And Wo Contrast  Result Date: 03/21/2017 CLINICAL DATA:  Initial evaluation for acute dizziness, syncope, ataxia, stroke suspected. EXAM: MRI HEAD WITHOUT AND WITH CONTRAST MRA HEAD WITHOUT CONTRAST MRA NECK WITHOUT AND WITH CONTRAST TECHNIQUE: Multiplanar, multiecho pulse sequences of the brain and surrounding structures were obtained without intravenous contrast. Angiographic images of the Circle of Willis were obtained using MRA technique without intravenous contrast. Angiographic images of the neck were obtained using MRA technique without and with intravenous contrast. Carotid stenosis measurements (when applicable) are obtained utilizing NASCET criteria, using the distal internal carotid diameter as the denominator. CONTRAST:  87mL MULTIHANCE GADOBENATE DIMEGLUMINE 529 MG/ML IV SOLN COMPARISON:  None. FINDINGS: MRI HEAD FINDINGS Mild diffuse prominence of the CSF containing spaces is compatible with generalized age related cerebral atrophy. Patchy and confluent T2/FLAIR hyperintensity within the periventricular and deep white matter both cerebral hemispheres most consistent with chronic small vessel ischemic disease, overall moderate nature. Chronic microvascular ischemic changes present within the pons. No abnormal foci of restricted diffusion to suggest acute or subacute ischemia. Gray-white matter differentiation maintained. No foci of susceptibility artifact to suggest acute or chronic intracranial hemorrhage. No encephalomalacia to suggest remote cortical infarction. No mass lesion, midline shift or mass effect. No hydrocephalus. No extra-axial fluid collection. Major dural sinuses are grossly patent. No abnormal enhancement. Pituitary gland suprasellar region normal. Midline structures intact and  normal. Major intracranial vascular flow voids are maintained. Craniocervical junction within normal limits. Upper cervical spine unremarkable. Bone marrow signal intensity within normal limits. No scalp soft tissue abnormality. Globes and oval soft tissues within normal limits. Paranasal sinuses are clear. No mastoid effusion. Inner ear structures normal. MRA HEAD FINDINGS ANTERIOR CIRCULATION: Distal cervical segments of the internal carotid arteries are widely patent with antegrade flow. Petrous, cavernous, and supraclinoid segments widely patent without stenosis. ICA termini widely patent. A1 segments patent bilaterally. Right A1 segment hypoplastic, which likely accounts for the slightly diminutive right ICA is compared to the left. Normal anterior communicating artery. Anterior cerebral arteries widely patent to their distal aspects without stenosis. M1 segments patent without stenosis or occlusion. Normal MCA bifurcations. No proximal M2 occlusion. Distal MCA branches well perfused and symmetric. POSTERIOR CIRCULATION: Vertebral arteries patent to the vertebrobasilar junction without stenosis. Left vertebral artery dominant. Posterior inferior cerebral arteries patent proximally. Basilar artery widely patent to its distal aspect. Superior cerebral arteries patent bilaterally. Both of the posterior cerebral arteries supplied via the basilar and are well perfused to their distal aspects without stenosis. No aneurysm  or vascular malformation. MRA NECK FINDINGS Source images reviewed. Visualized aortic arch of normal caliber with normal branch pattern. No flow-limiting stenosis about the origin of the great vessels. Partially visualized subclavian artery is widely patent without stenosis. Right common and internal carotid arteries widely patent without stenosis or occlusion. No significant atheromatous narrowing about the right carotid bifurcation. Left common and internal carotid artery is widely patent without  stenosis or occlusion. No significant atheromatous narrowing about the left carotid bifurcation. Both of the vertebral arteries arise from the subclavian arteries. Left vertebral artery dominant. Right vertebral artery diffusely diminutive/hypoplastic. Vertebral arteries patent within the neck without stenosis or occlusion. IMPRESSION: MRI HEAD IMPRESSION: 1. No acute intracranial infarct or other abnormality identified. 2. Generalized age-related cerebral atrophy with moderate chronic small vessel ischemic disease. MRA HEAD IMPRESSION: Normal intracranial MRA. No evidence for large vessel occlusion. No high-grade or correctable stenosis. MRA NECK IMPRESSION: Normal MRA of the neck. No high-grade or critical flow limiting stenosis identified. Electronically Signed   By: Jeannine Boga M.D.   On: 03/21/2017 21:20   Dg Humerus Right  Result Date: 03/21/2017 CLINICAL DATA:  76 year old female with dizziness and fall today. Right side pain. EXAM: RIGHT HUMERUS - 2+ VIEW COMPARISON:  Chest CT 10/07/2016.  Chest radiographs 03/04/2015. FINDINGS: A small triangular ossific fragment at the right acromioclavicular joint is chronic and unchanged from the CT earlier this year. Grossly normal alignment at the right shoulder. Bone mineralization is within normal limits for age. The right humerus appears intact. Grossly normal alignment at the right elbow. Visible right ribs and lung parenchyma appear stable. IMPRESSION: No acute fracture or dislocation identified about the right humerus. Electronically Signed   By: Genevie Ann M.D.   On: 03/21/2017 13:20   Dg Hip Unilat W Or Wo Pelvis 2-3 Views Right  Result Date: 03/21/2017 CLINICAL DATA:  Dizziness, fall, right side pain EXAM: DG HIP (WITH OR WITHOUT PELVIS) 2-3V RIGHT COMPARISON:  None FINDINGS: Hip joints and SI joints are symmetric and unremarkable. No acute bony abnormality. Specifically, no fracture, subluxation, or dislocation. Soft tissues are intact.  IMPRESSION: No acute bony abnormality. Electronically Signed   By: Rolm Baptise M.D.   On: 03/21/2017 13:20    Assessment and Plan:   1. Syncope x 2, persistent dizziness - echo with normal function - head CT negative for acute processes - MRI and MRA normal - EEG normal - EKG without bradycardia or signs of heart block - orthostatic vitals normal - TSH normal - UA negative for UTI - discussed with the patient potential benefits of event monitor vs loop recorder - she will consider possible ILR in the future  2. HTN - home medications: norvasc - pressures have been labile [phone] systolic - continue to monitor  3. Fibromyalgia - home medications: gabapentin and effexor - pt is on a high dose of effexor, but reportedly a long-standing medication - her provider just increased her dose by 75 mg 2 weeks ago - she states that she has tolerated this dose in the past (when she was 76 yo)   Given her overall clinical picture, have low suspicion for cardiac source of syncope. Would recommend lower her dose of effexor and referral to vestibular PT.  For questions or updates, please contact [facility] Please consult [email or web address] for contact info under Cardiology/STEMI.   Signed, Ledora Bottcher, [place]  03/24/2017 1:28 PM   Patient examined chart reviewed Discussed care with patient , husband and PA. Exam with  normal heart sounds clear lungs no edema Telemetry with NSR no arrhythmia, ECG normal with normal QT/ has had constant dizziness since fall Episodes also seem to coincide with recent increase in Effexor dose 2 weeks ago. She is on a very high dose. Discussed loop recorder with patient and we agreed to defer since symptoms not likely to be cardiac. For now would suggest decreasing effexor dose and possible referral to vestibular PT as outpatient through neuro. No further cardiac w/u indicated at this time  Jenkins Rouge

## 2017-03-24 NOTE — Progress Notes (Signed)
RN assessed Pt arms for IV access. No viable veins noted. IV consult needed

## 2017-03-25 DIAGNOSIS — R42 Dizziness and giddiness: Secondary | ICD-10-CM

## 2017-03-25 LAB — CBC
HCT: 44.2 % (ref 36.0–46.0)
HEMOGLOBIN: 14.6 g/dL (ref 12.0–15.0)
MCH: 34.8 pg — AB (ref 26.0–34.0)
MCHC: 33 g/dL (ref 30.0–36.0)
MCV: 105.2 fL — ABNORMAL HIGH (ref 78.0–100.0)
PLATELETS: 199 10*3/uL (ref 150–400)
RBC: 4.2 MIL/uL (ref 3.87–5.11)
RDW: 13.3 % (ref 11.5–15.5)
WBC: 5.4 10*3/uL (ref 4.0–10.5)

## 2017-03-25 LAB — BASIC METABOLIC PANEL
ANION GAP: 9 (ref 5–15)
BUN: 20 mg/dL (ref 6–20)
CALCIUM: 9 mg/dL (ref 8.9–10.3)
CO2: 27 mmol/L (ref 22–32)
CREATININE: 1.06 mg/dL — AB (ref 0.44–1.00)
Chloride: 102 mmol/L (ref 101–111)
GFR, EST AFRICAN AMERICAN: 58 mL/min — AB (ref >60)
GFR, EST NON AFRICAN AMERICAN: 50 mL/min — AB (ref >60)
Glucose, Bld: 121 mg/dL — ABNORMAL HIGH (ref 65–99)
Potassium: 3.4 mmol/L — ABNORMAL LOW (ref 3.5–5.1)
SODIUM: 138 mmol/L (ref 135–145)

## 2017-03-25 LAB — GLUCOSE, CAPILLARY: GLUCOSE-CAPILLARY: 129 mg/dL — AB (ref 65–99)

## 2017-03-25 NOTE — Progress Notes (Signed)
Family Medicine Teaching Service Daily Progress Note Intern Pager: [phone]  Patient name: Cassandra Brooks Medical record number: [id number] Date of birth: Aug 27, 1940 Age: 76 y.o. Gender: female  Primary Care Provider: Lowella Dandy, NP Consultants: neurology, cardiology  Code Status: full   Pt Overview and Major Events to Date:  Admitted to [place] on 03/21/17  Assessment and Plan: Cassandra Montgomeryis a 76 y.o.femalepresenting with dizziness and syncope. PMH is significant forHTN, glaucoma, GERD, HLD, OA, and anxiety.  Syncopal episodes Have ruled out stroke, intracranial abnormality, seizure activity, MI, and orthostatic. MRI of cervical spine showing mild spinal stenosis of C5-6 and C6-7, and moderate/severe right C5 and bilateraly C6 nerve level foraminal stenosis. Neuro exam largely normal except for focal left leg weakness, which is likelypolio as child andpossiblypost-polio syndrome. -monitor on telemetry -PT/OT consult-recommend [facility] -neurology consulted, appreciate recommendations. Recommend physical therapy and referral to Dr. Carles Collet as outpatient.  -continue gabapentin to 300mg  at bedtime, and 100mg  bid with breakfast and lunch  -cardiology consulted as inpatient, appreciate recommendations. Recommend lowering effexor dose and referral to vestibular PT, will have outpatient follow up for event monitor vs loop recorder at future date   Macrocytosis Stable. Seen on labs, MCV 105.2 on admission. MCV today of105.9. Unclear etiology as no alcohol use, B12 wnl, TSH wnl, and folate wnl.   -consider outpatient work up   HTN Current BP135/54. Home meds: amlodipine  -continue norvasc 10mg , continue HCTZ -monitor BP  HLD Home meds: pravachol 20 mg -continuehome medication  Anxiety Home meds: 225 mg effexor daily. Anxiety possibly contributing to "shaking episodes" -continue home med, will decrease effexor to 150mg   Glaucoma Home meds: cosopt eye drops -continue home  eye drops  GERD Home meds: prilosec daily -continue PPI  OAof spine, hips, knees Home meds: nabumetone at home -hold NSAIDdue to mildly elevated Cr, restart as able -tylenol as needed for pain  FEN/GI:regular diet Prophylaxis:lovenox  Disposition: discharge to home with home health   Subjective:  Patient today with no concerns. States some dizziness when getting up and walking but none when sitting up. Denies CP, SOB, nausea, vomiting, or diarrhea. Wants to be discharged.   Objective: Temp:  [97.5 F (36.4 C)-98.7 F (37.1 C)] 97.5 F (36.4 C) (12/15 1709) Pulse Rate:  [72-77] 77 (12/15 1709) Resp:  [18-20] 20 (12/15 1709) BP: (130-159)/(51-80) 159/51 (12/15 1709) SpO2:  [93 %-98 %] 98 % (12/15 1709) Physical Exam: General: awake and alert, sitting up in bed, NAD Cardiovascular: RRR, no MRG Respiratory: CTAB, no wheezes, rales, or rhonchi  Abdomen: soft, tender, non distended, bowel sounds normal  Extremities: non tender, 4/5 muscle strength bilaterally in lower extremities, normal grip strength Neuro: CN2-12 grossly intact, sensation intact bilaterally, no dysmetria, PERRL, EOMI  Laboratory: Recent Labs  Lab 03/23/17 0453 03/24/17 0411 03/25/17 0254  WBC 4.4 4.4 5.4  HGB 14.6 15.1* 14.6  HCT 44.7 46.0 44.2  PLT 177 180 199   Recent Labs  Lab 03/23/17 0453 03/24/17 0411 03/25/17 0254  NA 140 139 138  K 3.7 3.6 3.4*  CL 106 104 102  CO2 24 26 27   BUN 15 19 20   CREATININE 0.86 0.93 1.06*  CALCIUM 8.8* 9.0 9.0  GLUCOSE 114* 112* 121*     Imaging/Diagnostic Tests: Dg Chest 2 View  Result Date: 03/21/2017 CLINICAL DATA:  Dizziness, fall EXAM: CHEST  2 VIEW COMPARISON:  Chest CT 10/07/2016 FINDINGS: Mild peribronchial thickening. Bibasilar atelectasis. Heart is normal size. No effusions or acute bony abnormality. IMPRESSION: Mild  bronchitic changes, bibasilar atelectasis. Electronically Signed   By: Rolm Baptise M.D.   On: 03/21/2017 13:18   Ct  Head Wo Contrast  Result Date: 03/21/2017 CLINICAL DATA:  76 year old female with multiple syncopal episodes and falls. Denies headache. Initial encounter. EXAM: CT HEAD WITHOUT CONTRAST TECHNIQUE: Contiguous axial images were obtained from the base of the skull through the vertex without intravenous contrast. COMPARISON:  None. FINDINGS: Brain: No intracranial hemorrhage or CT evidence of large acute infarct. Mild chronic microvascular changes. Mild global atrophy without hydrocephalus. No intracranial mass lesion noted on this unenhanced exam. Vascular: Vascular calcifications Skull: No skull fracture Sinuses/Orbits: No acute orbital abnormality. Visualized paranasal sinuses are clear. Other: Mastoid air cells and middle ear cavities are clear. Temporomandibular joint degenerative changes. IMPRESSION: Normal for age unenhanced head CT. Electronically Signed   By: Genia Del M.D.   On: 03/21/2017 13:06   Mr Angiogram Head W Or Wo Contrast  Result Date: 03/21/2017 CLINICAL DATA:  Initial evaluation for acute dizziness, syncope, ataxia, stroke suspected. EXAM: MRI HEAD WITHOUT AND WITH CONTRAST MRA HEAD WITHOUT CONTRAST MRA NECK WITHOUT AND WITH CONTRAST TECHNIQUE: Multiplanar, multiecho pulse sequences of the brain and surrounding structures were obtained without intravenous contrast. Angiographic images of the Circle of Willis were obtained using MRA technique without intravenous contrast. Angiographic images of the neck were obtained using MRA technique without and with intravenous contrast. Carotid stenosis measurements (when applicable) are obtained utilizing NASCET criteria, using the distal internal carotid diameter as the denominator. CONTRAST:  25mL MULTIHANCE GADOBENATE DIMEGLUMINE 529 MG/ML IV SOLN COMPARISON:  None. FINDINGS: MRI HEAD FINDINGS Mild diffuse prominence of the CSF containing spaces is compatible with generalized age related cerebral atrophy. Patchy and confluent T2/FLAIR  hyperintensity within the periventricular and deep white matter both cerebral hemispheres most consistent with chronic small vessel ischemic disease, overall moderate nature. Chronic microvascular ischemic changes present within the pons. No abnormal foci of restricted diffusion to suggest acute or subacute ischemia. Gray-white matter differentiation maintained. No foci of susceptibility artifact to suggest acute or chronic intracranial hemorrhage. No encephalomalacia to suggest remote cortical infarction. No mass lesion, midline shift or mass effect. No hydrocephalus. No extra-axial fluid collection. Major dural sinuses are grossly patent. No abnormal enhancement. Pituitary gland suprasellar region normal. Midline structures intact and normal. Major intracranial vascular flow voids are maintained. Craniocervical junction within normal limits. Upper cervical spine unremarkable. Bone marrow signal intensity within normal limits. No scalp soft tissue abnormality. Globes and oval soft tissues within normal limits. Paranasal sinuses are clear. No mastoid effusion. Inner ear structures normal. MRA HEAD FINDINGS ANTERIOR CIRCULATION: Distal cervical segments of the internal carotid arteries are widely patent with antegrade flow. Petrous, cavernous, and supraclinoid segments widely patent without stenosis. ICA termini widely patent. A1 segments patent bilaterally. Right A1 segment hypoplastic, which likely accounts for the slightly diminutive right ICA is compared to the left. Normal anterior communicating artery. Anterior cerebral arteries widely patent to their distal aspects without stenosis. M1 segments patent without stenosis or occlusion. Normal MCA bifurcations. No proximal M2 occlusion. Distal MCA branches well perfused and symmetric. POSTERIOR CIRCULATION: Vertebral arteries patent to the vertebrobasilar junction without stenosis. Left vertebral artery dominant. Posterior inferior cerebral arteries patent  proximally. Basilar artery widely patent to its distal aspect. Superior cerebral arteries patent bilaterally. Both of the posterior cerebral arteries supplied via the basilar and are well perfused to their distal aspects without stenosis. No aneurysm or vascular malformation. MRA NECK FINDINGS Source images reviewed. Visualized aortic  arch of normal caliber with normal branch pattern. No flow-limiting stenosis about the origin of the great vessels. Partially visualized subclavian artery is widely patent without stenosis. Right common and internal carotid arteries widely patent without stenosis or occlusion. No significant atheromatous narrowing about the right carotid bifurcation. Left common and internal carotid artery is widely patent without stenosis or occlusion. No significant atheromatous narrowing about the left carotid bifurcation. Both of the vertebral arteries arise from the subclavian arteries. Left vertebral artery dominant. Right vertebral artery diffusely diminutive/hypoplastic. Vertebral arteries patent within the neck without stenosis or occlusion. IMPRESSION: MRI HEAD IMPRESSION: 1. No acute intracranial infarct or other abnormality identified. 2. Generalized age-related cerebral atrophy with moderate chronic small vessel ischemic disease. MRA HEAD IMPRESSION: Normal intracranial MRA. No evidence for large vessel occlusion. No high-grade or correctable stenosis. MRA NECK IMPRESSION: Normal MRA of the neck. No high-grade or critical flow limiting stenosis identified. Electronically Signed   By: Jeannine Boga M.D.   On: 03/21/2017 21:20   Mr Angiogram Neck W Or Wo Contrast  Result Date: 03/21/2017 CLINICAL DATA:  Initial evaluation for acute dizziness, syncope, ataxia, stroke suspected. EXAM: MRI HEAD WITHOUT AND WITH CONTRAST MRA HEAD WITHOUT CONTRAST MRA NECK WITHOUT AND WITH CONTRAST TECHNIQUE: Multiplanar, multiecho pulse sequences of the brain and surrounding structures were obtained  without intravenous contrast. Angiographic images of the Circle of Willis were obtained using MRA technique without intravenous contrast. Angiographic images of the neck were obtained using MRA technique without and with intravenous contrast. Carotid stenosis measurements (when applicable) are obtained utilizing NASCET criteria, using the distal internal carotid diameter as the denominator. CONTRAST:  38mL MULTIHANCE GADOBENATE DIMEGLUMINE 529 MG/ML IV SOLN COMPARISON:  None. FINDINGS: MRI HEAD FINDINGS Mild diffuse prominence of the CSF containing spaces is compatible with generalized age related cerebral atrophy. Patchy and confluent T2/FLAIR hyperintensity within the periventricular and deep white matter both cerebral hemispheres most consistent with chronic small vessel ischemic disease, overall moderate nature. Chronic microvascular ischemic changes present within the pons. No abnormal foci of restricted diffusion to suggest acute or subacute ischemia. Gray-white matter differentiation maintained. No foci of susceptibility artifact to suggest acute or chronic intracranial hemorrhage. No encephalomalacia to suggest remote cortical infarction. No mass lesion, midline shift or mass effect. No hydrocephalus. No extra-axial fluid collection. Major dural sinuses are grossly patent. No abnormal enhancement. Pituitary gland suprasellar region normal. Midline structures intact and normal. Major intracranial vascular flow voids are maintained. Craniocervical junction within normal limits. Upper cervical spine unremarkable. Bone marrow signal intensity within normal limits. No scalp soft tissue abnormality. Globes and oval soft tissues within normal limits. Paranasal sinuses are clear. No mastoid effusion. Inner ear structures normal. MRA HEAD FINDINGS ANTERIOR CIRCULATION: Distal cervical segments of the internal carotid arteries are widely patent with antegrade flow. Petrous, cavernous, and supraclinoid segments widely  patent without stenosis. ICA termini widely patent. A1 segments patent bilaterally. Right A1 segment hypoplastic, which likely accounts for the slightly diminutive right ICA is compared to the left. Normal anterior communicating artery. Anterior cerebral arteries widely patent to their distal aspects without stenosis. M1 segments patent without stenosis or occlusion. Normal MCA bifurcations. No proximal M2 occlusion. Distal MCA branches well perfused and symmetric. POSTERIOR CIRCULATION: Vertebral arteries patent to the vertebrobasilar junction without stenosis. Left vertebral artery dominant. Posterior inferior cerebral arteries patent proximally. Basilar artery widely patent to its distal aspect. Superior cerebral arteries patent bilaterally. Both of the posterior cerebral arteries supplied via the basilar and are well perfused  to their distal aspects without stenosis. No aneurysm or vascular malformation. MRA NECK FINDINGS Source images reviewed. Visualized aortic arch of normal caliber with normal branch pattern. No flow-limiting stenosis about the origin of the great vessels. Partially visualized subclavian artery is widely patent without stenosis. Right common and internal carotid arteries widely patent without stenosis or occlusion. No significant atheromatous narrowing about the right carotid bifurcation. Left common and internal carotid artery is widely patent without stenosis or occlusion. No significant atheromatous narrowing about the left carotid bifurcation. Both of the vertebral arteries arise from the subclavian arteries. Left vertebral artery dominant. Right vertebral artery diffusely diminutive/hypoplastic. Vertebral arteries patent within the neck without stenosis or occlusion. IMPRESSION: MRI HEAD IMPRESSION: 1. No acute intracranial infarct or other abnormality identified. 2. Generalized age-related cerebral atrophy with moderate chronic small vessel ischemic disease. MRA HEAD IMPRESSION: Normal  intracranial MRA. No evidence for large vessel occlusion. No high-grade or correctable stenosis. MRA NECK IMPRESSION: Normal MRA of the neck. No high-grade or critical flow limiting stenosis identified. Electronically Signed   By: Jeannine Boga M.D.   On: 03/21/2017 21:20   Mr Jeri Cos And Wo Contrast  Result Date: 03/21/2017 CLINICAL DATA:  Initial evaluation for acute dizziness, syncope, ataxia, stroke suspected. EXAM: MRI HEAD WITHOUT AND WITH CONTRAST MRA HEAD WITHOUT CONTRAST MRA NECK WITHOUT AND WITH CONTRAST TECHNIQUE: Multiplanar, multiecho pulse sequences of the brain and surrounding structures were obtained without intravenous contrast. Angiographic images of the Circle of Willis were obtained using MRA technique without intravenous contrast. Angiographic images of the neck were obtained using MRA technique without and with intravenous contrast. Carotid stenosis measurements (when applicable) are obtained utilizing NASCET criteria, using the distal internal carotid diameter as the denominator. CONTRAST:  7mL MULTIHANCE GADOBENATE DIMEGLUMINE 529 MG/ML IV SOLN COMPARISON:  None. FINDINGS: MRI HEAD FINDINGS Mild diffuse prominence of the CSF containing spaces is compatible with generalized age related cerebral atrophy. Patchy and confluent T2/FLAIR hyperintensity within the periventricular and deep white matter both cerebral hemispheres most consistent with chronic small vessel ischemic disease, overall moderate nature. Chronic microvascular ischemic changes present within the pons. No abnormal foci of restricted diffusion to suggest acute or subacute ischemia. Gray-white matter differentiation maintained. No foci of susceptibility artifact to suggest acute or chronic intracranial hemorrhage. No encephalomalacia to suggest remote cortical infarction. No mass lesion, midline shift or mass effect. No hydrocephalus. No extra-axial fluid collection. Major dural sinuses are grossly patent. No abnormal  enhancement. Pituitary gland suprasellar region normal. Midline structures intact and normal. Major intracranial vascular flow voids are maintained. Craniocervical junction within normal limits. Upper cervical spine unremarkable. Bone marrow signal intensity within normal limits. No scalp soft tissue abnormality. Globes and oval soft tissues within normal limits. Paranasal sinuses are clear. No mastoid effusion. Inner ear structures normal. MRA HEAD FINDINGS ANTERIOR CIRCULATION: Distal cervical segments of the internal carotid arteries are widely patent with antegrade flow. Petrous, cavernous, and supraclinoid segments widely patent without stenosis. ICA termini widely patent. A1 segments patent bilaterally. Right A1 segment hypoplastic, which likely accounts for the slightly diminutive right ICA is compared to the left. Normal anterior communicating artery. Anterior cerebral arteries widely patent to their distal aspects without stenosis. M1 segments patent without stenosis or occlusion. Normal MCA bifurcations. No proximal M2 occlusion. Distal MCA branches well perfused and symmetric. POSTERIOR CIRCULATION: Vertebral arteries patent to the vertebrobasilar junction without stenosis. Left vertebral artery dominant. Posterior inferior cerebral arteries patent proximally. Basilar artery widely patent to its distal aspect. Superior  cerebral arteries patent bilaterally. Both of the posterior cerebral arteries supplied via the basilar and are well perfused to their distal aspects without stenosis. No aneurysm or vascular malformation. MRA NECK FINDINGS Source images reviewed. Visualized aortic arch of normal caliber with normal branch pattern. No flow-limiting stenosis about the origin of the great vessels. Partially visualized subclavian artery is widely patent without stenosis. Right common and internal carotid arteries widely patent without stenosis or occlusion. No significant atheromatous narrowing about the right  carotid bifurcation. Left common and internal carotid artery is widely patent without stenosis or occlusion. No significant atheromatous narrowing about the left carotid bifurcation. Both of the vertebral arteries arise from the subclavian arteries. Left vertebral artery dominant. Right vertebral artery diffusely diminutive/hypoplastic. Vertebral arteries patent within the neck without stenosis or occlusion. IMPRESSION: MRI HEAD IMPRESSION: 1. No acute intracranial infarct or other abnormality identified. 2. Generalized age-related cerebral atrophy with moderate chronic small vessel ischemic disease. MRA HEAD IMPRESSION: Normal intracranial MRA. No evidence for large vessel occlusion. No high-grade or correctable stenosis. MRA NECK IMPRESSION: Normal MRA of the neck. No high-grade or critical flow limiting stenosis identified. Electronically Signed   By: Jeannine Boga M.D.   On: 03/21/2017 21:20   Mr Cervical Spine Wo Contrast  Result Date: 03/24/2017 CLINICAL DATA:  76 year old female with several syncopal episodes. 5-6 month history of progressive dizziness. EXAM: MRI CERVICAL SPINE WITHOUT CONTRAST TECHNIQUE: Multiplanar, multisequence MR imaging of the cervical spine was performed. No intravenous contrast was administered. COMPARISON:  Brain MRI, head and neck MRA 03/21/2017. FINDINGS: Alignment: Straightening of lower cervical lordosis. Subtle anterolisthesis of C7 on T1. Preserved upper cervical lordosis. Vertebrae: No marrow edema or evidence of acute osseous abnormality. Visualized bone marrow signal is within normal limits. Degenerative endplate marrow signal changes at C5-C6 and C6-C7. Cord: Spinal cord signal is within normal limits at all visualized levels. Posterior Fossa, vertebral arteries, paraspinal tissues: Stable visible brain parenchyma. Preserved major vascular flow voids in the neck. Dominant left vertebral artery re- demonstrated. Negative neck soft tissues. Disc levels: C2-C3:   Negative. C3-C4: Mild disc bulge and endplate spurring. Mild facet and ligament flavum hypertrophy. No spinal stenosis. Mild bilateral C4 foraminal stenosis. C4-C5: Moderate facet hypertrophy greater on the right. Minimal disc bulge and endplate spurring. No spinal stenosis. Moderate right C5 neural foraminal stenosis. C5-C6: Disc space loss with circumferential disc osteophyte complex. Broad-based posterior and bilateral foraminal components. Mild to moderate facet and ligament flavum hypertrophy. Spinal stenosis with no definite spinal cord mass effect. Moderate to severe bilateral C6 foraminal stenosis. C6-C7: Disc space loss with circumferential disc osteophyte complex. Broad-based posterior component in the midline. Mild to moderate ligament flavum hypertrophy. Spinal stenosis with no spinal cord mass effect. Mild bilateral C7 foraminal stenosis. C7-T1: Circumferential disc bulge and endplate spurring. Mild left and moderate right facet hypertrophy. Ligament flavum hypertrophy. No spinal stenosis. Mild right C8 neural foraminal stenosis. Upper thoracic facet hypertrophy. No upper thoracic spinal stenosis. IMPRESSION: 1. Lower cervical spine disc, endplate, and facet degeneration. 2. Multifactorial mild spinal stenosis at C5-C6 and C6-C7. No spinal cord signal abnormality, no definite cord mass effect. 3. Multifactorial moderate or severe right C5 and bilateral C6 nerve level foraminal stenosis. Electronically Signed   By: Genevie Ann M.D.   On: 03/24/2017 16:50   Dg Humerus Right  Result Date: 03/21/2017 CLINICAL DATA:  76 year old female with dizziness and fall today. Right side pain. EXAM: RIGHT HUMERUS - 2+ VIEW COMPARISON:  Chest CT 10/07/2016.  Chest  radiographs 03/04/2015. FINDINGS: A small triangular ossific fragment at the right acromioclavicular joint is chronic and unchanged from the CT earlier this year. Grossly normal alignment at the right shoulder. Bone mineralization is within normal limits for  age. The right humerus appears intact. Grossly normal alignment at the right elbow. Visible right ribs and lung parenchyma appear stable. IMPRESSION: No acute fracture or dislocation identified about the right humerus. Electronically Signed   By: Genevie Ann M.D.   On: 03/21/2017 13:20   Dg Hip Unilat W Or Wo Pelvis 2-3 Views Right  Result Date: 03/21/2017 CLINICAL DATA:  Dizziness, fall, right side pain EXAM: DG HIP (WITH OR WITHOUT PELVIS) 2-3V RIGHT COMPARISON:  None FINDINGS: Hip joints and SI joints are symmetric and unremarkable. No acute bony abnormality. Specifically, no fracture, subluxation, or dislocation. Soft tissues are intact. IMPRESSION: No acute bony abnormality. Electronically Signed   By: Rolm Baptise M.D.   On: 03/21/2017 13:20    Caroline More, DO 03/25/2017, 8:06 PM PGY-1, [facility] Intern pager: [phone], text pages welcome

## 2017-03-25 NOTE — Progress Notes (Signed)
Physical Therapy Treatment Patient Details Name: Gicela Schwarting MRN: [id number] DOB: 28-Dec-1940 Today's Date: 03/25/2017    History of Present Illness Patient is a 76 y/o female who presents with dizziness and syncope episodes at home associated with slurred speech and shaking episodes. Head CT, Brain MRI/MRA unremarkable. CXR- bibasilar atelectasis. PMH includes HTN, AAA, glaucoma bil eyes, depression, fibromyalgia.     PT Comments    Patient continues to have significant dizziness at rest and with movement.  Significant dizziness lasts > 1 minute.   Patient was able to increase distance with gait, but continued to require physical assistance for balance/safety and fall prevention.  MD in room, speaking with patient, husband, and daughter (by phone).  Husband does not feel he can provide assistance required.  Patient requires continued/more intense physical therapy for vestibular/balance rehab.  Recommend patient discharge to [facility] for continued therapy to facilitate safe return home.   Follow Up Recommendations  SNF;Supervision for mobility/OOB     Equipment Recommendations  Rolling walker with 5" wheels    Recommendations for Other Services       Precautions / Restrictions Precautions Precautions: Fall Precaution Comments: Dizziness Restrictions Weight Bearing Restrictions: No    Mobility  Bed Mobility Overal bed mobility: Needs Assistance Bed Mobility: Supine to Sit;Sit to Supine     Supine to sit: Min assist Sit to supine: Min assist   General bed mobility comments: Instructed patient to move slowly and use UE's to raise trunk.  Husband reached toward patient, grabbed patient's hand and pulled her up quickly.  Patient with significant dizziness following this.  Did improve while sitting still.  To return to supine, assist to bring LE's onto bed - moved more slowly for this transition.  Talked with patient and husband about moving slowly.  Transfers Overall transfer  level: Needs assistance Equipment used: Rolling walker (2 wheeled) Transfers: Sit to/from Stand Sit to Stand: Min guard         General transfer comment: Used visual target while moving to stance to minimize dizziness.  No physical assist needed during transfers today.  Ambulation/Gait Ambulation/Gait assistance: Min assist Ambulation Distance (Feet): 110 Feet Assistive device: Rolling walker (2 wheeled) Gait Pattern/deviations: Step-through pattern;Decreased stride length;Narrow base of support Gait velocity: decreased Gait velocity interpretation: Below normal speed for age/gender General Gait Details: Used visual targets for gaze stabilization during gait and turns.  Patient with slow, guarded gait pattern.  When dizziness increases, patient with posterior lean.  Assist to regain balance at times.   Stairs            Wheelchair Mobility    Modified Rankin (Stroke Patients Only)       Balance Overall balance assessment: Needs assistance;History of Falls Sitting-balance support: No upper extremity supported;Feet supported Sitting balance-Leahy Scale: Fair Sitting balance - Comments: Can maintain static sitting balance.  Requires support for any dynamic activity   Standing balance support: Bilateral upper extremity supported Standing balance-Leahy Scale: Poor Standing balance comment: Requires UE support for balance due to dizziness.                            Cognition Arousal/Alertness: Awake/alert Behavior During Therapy: WFL for tasks assessed/performed;Anxious Overall Cognitive Status: Within Functional Limits for tasks assessed  Exercises Other Exercises Other Exercises: Instructed patient on x1 exercises to be done 3 times/day.    General Comments        Pertinent Vitals/Pain Pain Assessment: No/denies pain    Home Living                      Prior Function             PT Goals (current goals can now be found in the care plan section) Acute Rehab PT Goals Patient Stated Goal: to make this dizziness go away Progress towards PT goals: Progressing toward goals    Frequency    Min 3X/week      PT Plan Discharge plan needs to be updated    Co-evaluation              AM-PAC PT "6 Clicks" Daily Activity  Outcome Measure  Difficulty turning over in bed (including adjusting bedclothes, sheets and blankets)?: A Little Difficulty moving from lying on back to sitting on the side of the bed? : A Little Difficulty sitting down on and standing up from a chair with arms (e.g., wheelchair, bedside commode, etc,.)?: A Little Help needed moving to and from a bed to chair (including a wheelchair)?: A Little Help needed walking in hospital room?: A Little Help needed climbing 3-5 steps with a railing? : A Lot 6 Click Score: 17    End of Session Equipment Utilized During Treatment: Gait belt Activity Tolerance: Treatment limited secondary to medical complications (Comment)(Limited by dizziness) Patient left: in bed;with call bell/phone within reach;with bed alarm set;with family/visitor present(with HOB elevated) Nurse Communication: Mobility status PT Visit Diagnosis: Unsteadiness on feet (R26.81);Other abnormalities of gait and mobility (R26.89);History of falling (Z91.81);Dizziness and giddiness (R42)     Time: 3762-8315 PT Time Calculation (min) (ACUTE ONLY): 47 min  Charges:  $Gait Training: 23-37 mins $Therapeutic Exercise: 8-22 mins                    G Codes:       Carita Pian. Sanjuana Kava, [facility] Acute Rehab Services Pager [place] 03/25/2017, 6:31 PM

## 2017-03-25 NOTE — Discharge Instructions (Signed)
You were admitted for syncope and shaking. We did extensive testing and were unable to find a clear source for your syncope. You improved slightly during admission.   Please continue to work with PT. Please also follow up with cardiology, neurology, and your PCP (within 1 week of discharge) after discharge.  Please also follow up with vestibular PT   If you have continued dizziness, shaking, incontinence with shaking episodes, or passing out spells please come to the emergency room.

## 2017-03-25 NOTE — Plan of Care (Signed)
  Health Behavior/Discharge Planning: Ability to manage health-related needs will improve 03/25/2017 0514 - Progressing by Marcos Eke, RN

## 2017-03-25 NOTE — Progress Notes (Signed)
Occupational Therapy Treatment Patient Details Name: Cassandra Brooks MRN: [id number] DOB: November 12, 1940 Today's Date: 03/25/2017    History of present illness Patient is a 76 y/o female who presents with dizziness and syncope episodes at home associated with slurred speech and shaking episodes. Head CT, Brain MRI/MRA unremarkable. CXR- bibasilar atelectasis. PMH includes HTN, AAA, glaucoma bil eyes, depression, fibromyalgia.    OT comments  Pt. Able to complete bed mobility, LB dressing and toileting tasks min guard a.  Husband present for session and very involved and eager to assist pt. As needed.   Follow Up Recommendations  No OT follow up;Supervision/Assistance - 24 hour    Equipment Recommendations  Tub/shower seat    Recommendations for Other Services      Precautions / Restrictions Precautions Precautions: Fall Precaution Comments: orthostatic, vertigo       Mobility Bed Mobility Overal bed mobility: Needs Assistance Bed Mobility: Supine to Sit     Supine to sit: Min guard        Transfers Overall transfer level: Needs assistance Equipment used: Rolling walker (2 wheeled) Transfers: Sit to/from [facility] Sit to Stand: Min guard Stand pivot transfers: Min guard       General transfer comment: encouraged rest breaks and focusing until dizziness subsides    Balance                                           ADL either performed or assessed with clinical judgement   ADL Overall ADL's : Needs assistance/impaired                     Lower Body Dressing: Set up;Sitting/lateral leans Lower Body Dressing Details (indicate cue type and reason): able to don socks in sitting Toilet Transfer: Min guard;RW;Ambulation   Toileting- Clothing Manipulation and Hygiene: Supervision/safety;Sitting/lateral lean       Functional mobility during ADLs: Therapist, art       Cognition Arousal/Alertness: Awake/alert Behavior During Therapy: WFL for tasks assessed/performed Overall Cognitive Status: Within Functional Limits for tasks assessed                                          Exercises     Shoulder Instructions       General Comments      Pertinent Vitals/ Pain       Pain Assessment: No/denies pain  Home Living                                          Prior Functioning/Environment              Frequency  Min 2X/week        Progress Toward Goals  OT Goals(current goals can now be found in the care plan section)  Progress towards OT goals: Progressing toward goals     Plan      Co-evaluation                 AM-PAC PT "6 Clicks" Daily Activity     Outcome Measure  Help from another person eating meals?: None Help from another person taking care of personal grooming?: A Little Help from another person toileting, which includes using toliet, bedpan, or urinal?: A Little Help from another person bathing (including washing, rinsing, drying)?: A Little Help from another person to put on and taking off regular upper body clothing?: A Little Help from another person to put on and taking off regular lower body clothing?: A Little 6 Click Score: 19    End of Session Equipment Utilized During Treatment: Gait belt;Rolling walker  OT Visit Diagnosis: Unsteadiness on feet (R26.81);History of falling (Z91.81)   Activity Tolerance Patient tolerated treatment well   Patient Left in chair;with call bell/phone within reach;with nursing/sitter in room;with family/visitor present   Nurse Communication          Time: 1740-8144 OT Time Calculation (min): 11 min  Charges: OT General Charges $OT Visit: 1 Visit OT Treatments $Self Care/Home Management : 8-22 mins  Janice Coffin, COTA/L 03/25/2017, 10:03 AM

## 2017-03-26 DIAGNOSIS — R42 Dizziness and giddiness: Secondary | ICD-10-CM

## 2017-03-26 DIAGNOSIS — R55 Syncope and collapse: Secondary | ICD-10-CM

## 2017-03-26 LAB — HEPATIC FUNCTION PANEL
ALT: 42 U/L (ref 14–54)
AST: 45 U/L — ABNORMAL HIGH (ref 15–41)
Albumin: 3.3 g/dL — ABNORMAL LOW (ref 3.5–5.0)
Alkaline Phosphatase: 67 U/L (ref 38–126)
Bilirubin, Direct: <0.1 mg/dL — ABNORMAL LOW (ref 0.1–0.5)
Total Bilirubin: 0.4 mg/dL (ref 0.3–1.2)
Total Protein: 6.7 g/dL (ref 6.5–8.1)

## 2017-03-26 LAB — CBC
HCT: 44.9 % (ref 36.0–46.0)
Hemoglobin: 14.9 g/dL (ref 12.0–15.0)
MCH: 34.8 pg — ABNORMAL HIGH (ref 26.0–34.0)
MCHC: 33.2 g/dL (ref 30.0–36.0)
MCV: 104.9 fL — ABNORMAL HIGH (ref 78.0–100.0)
Platelets: 195 10*3/uL (ref 150–400)
RBC: 4.28 MIL/uL (ref 3.87–5.11)
RDW: 13.3 % (ref 11.5–15.5)
WBC: 4.6 10*3/uL (ref 4.0–10.5)

## 2017-03-26 LAB — BASIC METABOLIC PANEL
ANION GAP: 11 (ref 5–15)
BUN: 23 mg/dL — AB (ref 6–20)
CHLORIDE: 101 mmol/L (ref 101–111)
CO2: 27 mmol/L (ref 22–32)
Calcium: 8.9 mg/dL (ref 8.9–10.3)
Creatinine, Ser: 0.97 mg/dL (ref 0.44–1.00)
GFR calc Af Amer: >60 mL/min (ref >60)
GFR calc non Af Amer: 55 mL/min — ABNORMAL LOW (ref >60)
Glucose, Bld: 113 mg/dL — ABNORMAL HIGH (ref 65–99)
POTASSIUM: 3.5 mmol/L (ref 3.5–5.1)
SODIUM: 139 mmol/L (ref 135–145)

## 2017-03-26 LAB — GLUCOSE, CAPILLARY: Glucose-Capillary: 121 mg/dL — ABNORMAL HIGH (ref 65–99)

## 2017-03-26 NOTE — NC FL2 (Signed)
[place] LEVEL OF CARE SCREENING TOOL     IDENTIFICATION  Patient Name: Cassandra Brooks Birthdate: May 20, 1940 Sex: female Admission Date (Current Location): 03/21/2017  [facility] and [place] Number:  [employer] and Address:  [facility] [employer]. [facility], [place] [address] [place], [place] [address]      Provider Number: [id number]  Attending Physician Name and Address:  Zenia Resides, MD  Relative Name and Phone Number:       Current Level of Care: Hospital Recommended Level of Care: [place] Prior Approval Number:    Date Approved/Denied:   PASRR Number: [id number]  Discharge Plan: SNF    Current Diagnoses: Patient Active Problem List   Diagnosis Date Noted  . Disequilibrium   . Syncope 03/21/2017  . Dizziness   . Fall   . Unilateral primary osteoarthritis, left knee 02/26/2016  . Hemoptysis 03/18/2015  . Solitary pulmonary nodule 03/18/2015    Orientation RESPIRATION BLADDER Height & Weight     Self, Time, Situation, Place  Normal Continent Weight: 172 lb 2.9 oz (78.1 kg) Height:  5\' 2"  (157.5 cm)  BEHAVIORAL SYMPTOMS/MOOD NEUROLOGICAL BOWEL NUTRITION STATUS      Continent    AMBULATORY STATUS COMMUNICATION OF NEEDS Skin   Limited Assist Verbally Normal                       Personal Care Assistance Level of Assistance  Bathing, Feeding, Dressing Bathing Assistance: Limited assistance Feeding assistance: Independent Dressing Assistance: Limited assistance     Functional Limitations Info  Sight, Hearing, Speech Sight Info: Impaired(glasses) Hearing Info: Adequate Speech Info: Adequate    SPECIAL CARE FACTORS FREQUENCY  PT (By licensed PT), OT (By licensed OT)     PT Frequency: 5x/wk OT Frequency: 5x/wk            Contractures Contractures Info: Not present    Additional Factors Info  Code Status, Allergies, Psychotropic Code Status Info: FULL Allergies Info: NKA Psychotropic  Info: Effexor-XR 150mg  daily with breakfast         Current Medications (03/26/2017):  This is the current hospital active medication list Current Facility-Administered Medications  Medication Dose Route Frequency Provider Last Rate Last Dose  . acetaminophen (TYLENOL) tablet 650 mg  650 mg Oral Q6H PRN Lucila Maine C, DO   650 mg at 03/26/17 1445   Or  . acetaminophen (TYLENOL) suppository 650 mg  650 mg Rectal Q6H PRN Lucila Maine C, DO      . amLODipine (NORVASC) tablet 10 mg  10 mg Oral Daily Riccio, Angela C, DO   10 mg at 03/26/17 1124  . dorzolamide-timolol (COSOPT) 22.3-6.8 MG/ML ophthalmic solution 2 drop  2 drop Both Eyes Daily Riccio, Angela C, DO   2 drop at 03/26/17 1125  . enoxaparin (LOVENOX) injection 40 mg  40 mg Subcutaneous Q24H Riccio, Angela C, DO   40 mg at 03/25/17 2220  . gabapentin (NEURONTIN) capsule 100 mg  100 mg Oral BID WC Mikell, Jeani Sow, MD   100 mg at 03/26/17 5621  . gabapentin (NEURONTIN) capsule 300 mg  300 mg Oral QHS Mikell, Jeani Sow, MD   300 mg at 03/25/17 2220  . hydrochlorothiazide (MICROZIDE) capsule 12.5 mg  12.5 mg Oral Daily Tonette Bihari, MD   12.5 mg at 03/26/17 1124  . latanoprost (XALATAN) 0.005 % ophthalmic solution 1 drop  1 drop Both Eyes QHS Riccio, Angela C, DO  1 drop at 03/25/17 2221  . multivitamin with minerals tablet 1 tablet  1 tablet Oral Daily Lucila Maine C, DO   1 tablet at 03/26/17 1124  . ondansetron (ZOFRAN) tablet 4 mg  4 mg Oral Q6H PRN Lucila Maine C, DO       Or  . ondansetron (ZOFRAN) injection 4 mg  4 mg Intravenous Q6H PRN Riccio, Angela C, DO      . pantoprazole (PROTONIX) EC tablet 40 mg  40 mg Oral Daily Riccio, Angela C, DO   40 mg at 03/26/17 1124  . polyethylene glycol (MIRALAX / GLYCOLAX) packet 17 g  17 g Oral Daily PRN Vanetta Shawl, Angela C, DO      . pravastatin (PRAVACHOL) tablet 20 mg  20 mg Oral q1800 Riccio, Angela C, DO   20 mg at 03/25/17 1837  . sodium chloride flush (NS) 0.9 %  injection 3 mL  3 mL Intravenous Q12H Riccio, Angela C, DO   3 mL at 03/25/17 2222  . venlafaxine XR (EFFEXOR-XR) 24 hr capsule 150 mg  150 mg Oral Q breakfast Lucila Maine C, DO   150 mg at 03/26/17 7408     Discharge Medications: Please see discharge summary for a list of discharge medications.  Relevant Imaging Results:  Relevant Lab Results:   Additional Information SS#: [id number]  Geralynn Ochs, LCSW

## 2017-03-26 NOTE — Progress Notes (Signed)
Family Medicine Teaching Service Daily Progress Note Intern Pager: [phone]  Patient name: Cassandra Brooks Medical record number: [id number] Date of birth: 01-29-1941 Age: 76 y.o. Gender: female  Primary Care Provider: Lowella Dandy, NP Consultants: neurology, cardiology, PT/OT Code Status: full   Pt Overview and Major Events to Date:  Admitted to [facility] on 03/21/17  Assessment and Plan: Cassandra Montgomeryis a 76 y.o.femalepresenting with dizziness and syncope. PMH is significant forHTN, glaucoma, GERD, HLD, OA, and anxiety  Syncopal episodes- persistent with no clear explanation. Spinal stenosis seen on MRI spine of C5-C7 Have ruled out stroke, intracranial abnormality, seizure activity, MI, and orthostatic. MRI of cervical spine showing mild spinal stenosis of C5-6 and C6-7, and moderate/severe right C5 and bilateraly C6 nerve level foraminal stenosis. Neuro exam largely normal except for focal left leg weakness, which is likelypost-polio syndrome. -monitor on telemetry, will need outpatient event monitor -neurology signed off, Recommend physical therapy and referral to Dr. Carles Collet as outpatient.  -cardiology has evaluated patient. Recommend lowering effexor dose and referral to vestibular PT, will have outpatient follow up for event monitor vs loop recorder at future date   Macrocytosis Stable. Seen on labs, MCV 105.2 on admission. MCV today of105.9. Unclear etiology as no alcohol use, B12 wnl, TSH wnl, and folate wnl.   -consider outpatient work up   HTN Current BP110/54. Home meds: amlodipine  -continue norvasc 10mg , continue HCTZ -monitor BP  HLD Home meds: pravachol 20 mg -continuehome medication  Anxiety Home meds: 225 mg effexor daily. Anxiety possibly contributing to "shaking episodes" -continue effexor at lower dose 150 mg  Glaucoma Home meds: cosopt eye drops -continue home eye drops  GERD Home meds: prilosec daily -continue PPI  OAof spine, hips,  knees Home meds: nabumetone at home -hold NSAIDdue to mildly elevated Cr, restart as able -tylenol as needed for pain -gapapentin on 300mg  at bedtime, and 100mg  bid with breakfast and lunch   FEN/GI:regular diet Prophylaxis:lovenox  Disposition: SNF  Subjective:  Doing well today, awaiting SNF placement. Denies pain. No dizziness when laying down but states she needs help to get around and do things.   Objective: Temp:  [97.5 F (36.4 C)-98.3 F (36.8 C)] 97.9 F (36.6 C) (12/16 0100) Pulse Rate:  [72-81] 81 (12/16 0100) Resp:  [18-20] 18 (12/16 0100) BP: (110-159)/(51-80) 110/54 (12/16 0100) SpO2:  [95 %-98 %] 96 % (12/16 0100) Physical Exam: General: pleasant lady, laying in bed in NAD Cardiovascular: RRR, no MRG Respiratory: CTAB, no wheezes, rales, or rhonchi  Abdomen: soft, tender, non distended, bowel sounds normal  Extremities: no edema or cyanosis Neuro: CN2-12 grossly intact, sensation intact bilaterally, no dysmetria, PERRL, EOMI Psych: appropriate mood and affect  Laboratory: Recent Labs  Lab 03/24/17 0411 03/25/17 0254 03/26/17 0443  WBC 4.4 5.4 4.6  HGB 15.1* 14.6 14.9  HCT 46.0 44.2 44.9  PLT 180 199 195   Recent Labs  Lab 03/24/17 0411 03/25/17 0254 03/26/17 0443  NA 139 138 139  K 3.6 3.4* 3.5  CL 104 102 101  CO2 26 27 27   BUN 19 20 23*  CREATININE 0.93 1.06* 0.97  CALCIUM 9.0 9.0 8.9  GLUCOSE 112* 121* 113*     Imaging/Diagnostic Tests: Dg Chest 2 View  Result Date: 03/21/2017 CLINICAL DATA:  Dizziness, fall EXAM: CHEST  2 VIEW COMPARISON:  Chest CT 10/07/2016 FINDINGS: Mild peribronchial thickening. Bibasilar atelectasis. Heart is normal size. No effusions or acute bony abnormality. IMPRESSION: Mild bronchitic changes, bibasilar atelectasis. Electronically Signed  By: Rolm Baptise M.D.   On: 03/21/2017 13:18   Ct Head Wo Contrast  Result Date: 03/21/2017 CLINICAL DATA:  76 year old female with multiple syncopal episodes and  falls. Denies headache. Initial encounter. EXAM: CT HEAD WITHOUT CONTRAST TECHNIQUE: Contiguous axial images were obtained from the base of the skull through the vertex without intravenous contrast. COMPARISON:  None. FINDINGS: Brain: No intracranial hemorrhage or CT evidence of large acute infarct. Mild chronic microvascular changes. Mild global atrophy without hydrocephalus. No intracranial mass lesion noted on this unenhanced exam. Vascular: Vascular calcifications Skull: No skull fracture Sinuses/Orbits: No acute orbital abnormality. Visualized paranasal sinuses are clear. Other: Mastoid air cells and middle ear cavities are clear. Temporomandibular joint degenerative changes. IMPRESSION: Normal for age unenhanced head CT. Electronically Signed   By: Genia Del M.D.   On: 03/21/2017 13:06   Mr Angiogram Head W Or Wo Contrast  Result Date: 03/21/2017 CLINICAL DATA:  Initial evaluation for acute dizziness, syncope, ataxia, stroke suspected. EXAM: MRI HEAD WITHOUT AND WITH CONTRAST MRA HEAD WITHOUT CONTRAST MRA NECK WITHOUT AND WITH CONTRAST TECHNIQUE: Multiplanar, multiecho pulse sequences of the brain and surrounding structures were obtained without intravenous contrast. Angiographic images of the Circle of Willis were obtained using MRA technique without intravenous contrast. Angiographic images of the neck were obtained using MRA technique without and with intravenous contrast. Carotid stenosis measurements (when applicable) are obtained utilizing NASCET criteria, using the distal internal carotid diameter as the denominator. CONTRAST:  61mL MULTIHANCE GADOBENATE DIMEGLUMINE 529 MG/ML IV SOLN COMPARISON:  None. FINDINGS: MRI HEAD FINDINGS Mild diffuse prominence of the CSF containing spaces is compatible with generalized age related cerebral atrophy. Patchy and confluent T2/FLAIR hyperintensity within the periventricular and deep white matter both cerebral hemispheres most consistent with chronic small  vessel ischemic disease, overall moderate nature. Chronic microvascular ischemic changes present within the pons. No abnormal foci of restricted diffusion to suggest acute or subacute ischemia. Gray-white matter differentiation maintained. No foci of susceptibility artifact to suggest acute or chronic intracranial hemorrhage. No encephalomalacia to suggest remote cortical infarction. No mass lesion, midline shift or mass effect. No hydrocephalus. No extra-axial fluid collection. Major dural sinuses are grossly patent. No abnormal enhancement. Pituitary gland suprasellar region normal. Midline structures intact and normal. Major intracranial vascular flow voids are maintained. Craniocervical junction within normal limits. Upper cervical spine unremarkable. Bone marrow signal intensity within normal limits. No scalp soft tissue abnormality. Globes and oval soft tissues within normal limits. Paranasal sinuses are clear. No mastoid effusion. Inner ear structures normal. MRA HEAD FINDINGS ANTERIOR CIRCULATION: Distal cervical segments of the internal carotid arteries are widely patent with antegrade flow. Petrous, cavernous, and supraclinoid segments widely patent without stenosis. ICA termini widely patent. A1 segments patent bilaterally. Right A1 segment hypoplastic, which likely accounts for the slightly diminutive right ICA is compared to the left. Normal anterior communicating artery. Anterior cerebral arteries widely patent to their distal aspects without stenosis. M1 segments patent without stenosis or occlusion. Normal MCA bifurcations. No proximal M2 occlusion. Distal MCA branches well perfused and symmetric. POSTERIOR CIRCULATION: Vertebral arteries patent to the vertebrobasilar junction without stenosis. Left vertebral artery dominant. Posterior inferior cerebral arteries patent proximally. Basilar artery widely patent to its distal aspect. Superior cerebral arteries patent bilaterally. Both of the posterior  cerebral arteries supplied via the basilar and are well perfused to their distal aspects without stenosis. No aneurysm or vascular malformation. MRA NECK FINDINGS Source images reviewed. Visualized aortic arch of normal caliber with normal branch pattern.  No flow-limiting stenosis about the origin of the great vessels. Partially visualized subclavian artery is widely patent without stenosis. Right common and internal carotid arteries widely patent without stenosis or occlusion. No significant atheromatous narrowing about the right carotid bifurcation. Left common and internal carotid artery is widely patent without stenosis or occlusion. No significant atheromatous narrowing about the left carotid bifurcation. Both of the vertebral arteries arise from the subclavian arteries. Left vertebral artery dominant. Right vertebral artery diffusely diminutive/hypoplastic. Vertebral arteries patent within the neck without stenosis or occlusion. IMPRESSION: MRI HEAD IMPRESSION: 1. No acute intracranial infarct or other abnormality identified. 2. Generalized age-related cerebral atrophy with moderate chronic small vessel ischemic disease. MRA HEAD IMPRESSION: Normal intracranial MRA. No evidence for large vessel occlusion. No high-grade or correctable stenosis. MRA NECK IMPRESSION: Normal MRA of the neck. No high-grade or critical flow limiting stenosis identified. Electronically Signed   By: Jeannine Boga M.D.   On: 03/21/2017 21:20   Mr Angiogram Neck W Or Wo Contrast  Result Date: 03/21/2017 CLINICAL DATA:  Initial evaluation for acute dizziness, syncope, ataxia, stroke suspected. EXAM: MRI HEAD WITHOUT AND WITH CONTRAST MRA HEAD WITHOUT CONTRAST MRA NECK WITHOUT AND WITH CONTRAST TECHNIQUE: Multiplanar, multiecho pulse sequences of the brain and surrounding structures were obtained without intravenous contrast. Angiographic images of the Circle of Willis were obtained using MRA technique without intravenous  contrast. Angiographic images of the neck were obtained using MRA technique without and with intravenous contrast. Carotid stenosis measurements (when applicable) are obtained utilizing NASCET criteria, using the distal internal carotid diameter as the denominator. CONTRAST:  26mL MULTIHANCE GADOBENATE DIMEGLUMINE 529 MG/ML IV SOLN COMPARISON:  None. FINDINGS: MRI HEAD FINDINGS Mild diffuse prominence of the CSF containing spaces is compatible with generalized age related cerebral atrophy. Patchy and confluent T2/FLAIR hyperintensity within the periventricular and deep white matter both cerebral hemispheres most consistent with chronic small vessel ischemic disease, overall moderate nature. Chronic microvascular ischemic changes present within the pons. No abnormal foci of restricted diffusion to suggest acute or subacute ischemia. Gray-white matter differentiation maintained. No foci of susceptibility artifact to suggest acute or chronic intracranial hemorrhage. No encephalomalacia to suggest remote cortical infarction. No mass lesion, midline shift or mass effect. No hydrocephalus. No extra-axial fluid collection. Major dural sinuses are grossly patent. No abnormal enhancement. Pituitary gland suprasellar region normal. Midline structures intact and normal. Major intracranial vascular flow voids are maintained. Craniocervical junction within normal limits. Upper cervical spine unremarkable. Bone marrow signal intensity within normal limits. No scalp soft tissue abnormality. Globes and oval soft tissues within normal limits. Paranasal sinuses are clear. No mastoid effusion. Inner ear structures normal. MRA HEAD FINDINGS ANTERIOR CIRCULATION: Distal cervical segments of the internal carotid arteries are widely patent with antegrade flow. Petrous, cavernous, and supraclinoid segments widely patent without stenosis. ICA termini widely patent. A1 segments patent bilaterally. Right A1 segment hypoplastic, which likely  accounts for the slightly diminutive right ICA is compared to the left. Normal anterior communicating artery. Anterior cerebral arteries widely patent to their distal aspects without stenosis. M1 segments patent without stenosis or occlusion. Normal MCA bifurcations. No proximal M2 occlusion. Distal MCA branches well perfused and symmetric. POSTERIOR CIRCULATION: Vertebral arteries patent to the vertebrobasilar junction without stenosis. Left vertebral artery dominant. Posterior inferior cerebral arteries patent proximally. Basilar artery widely patent to its distal aspect. Superior cerebral arteries patent bilaterally. Both of the posterior cerebral arteries supplied via the basilar and are well perfused to their distal aspects without stenosis. No aneurysm  or vascular malformation. MRA NECK FINDINGS Source images reviewed. Visualized aortic arch of normal caliber with normal branch pattern. No flow-limiting stenosis about the origin of the great vessels. Partially visualized subclavian artery is widely patent without stenosis. Right common and internal carotid arteries widely patent without stenosis or occlusion. No significant atheromatous narrowing about the right carotid bifurcation. Left common and internal carotid artery is widely patent without stenosis or occlusion. No significant atheromatous narrowing about the left carotid bifurcation. Both of the vertebral arteries arise from the subclavian arteries. Left vertebral artery dominant. Right vertebral artery diffusely diminutive/hypoplastic. Vertebral arteries patent within the neck without stenosis or occlusion. IMPRESSION: MRI HEAD IMPRESSION: 1. No acute intracranial infarct or other abnormality identified. 2. Generalized age-related cerebral atrophy with moderate chronic small vessel ischemic disease. MRA HEAD IMPRESSION: Normal intracranial MRA. No evidence for large vessel occlusion. No high-grade or correctable stenosis. MRA NECK IMPRESSION: Normal  MRA of the neck. No high-grade or critical flow limiting stenosis identified. Electronically Signed   By: Jeannine Boga M.D.   On: 03/21/2017 21:20   Mr Jeri Cos And Wo Contrast  Result Date: 03/21/2017 CLINICAL DATA:  Initial evaluation for acute dizziness, syncope, ataxia, stroke suspected. EXAM: MRI HEAD WITHOUT AND WITH CONTRAST MRA HEAD WITHOUT CONTRAST MRA NECK WITHOUT AND WITH CONTRAST TECHNIQUE: Multiplanar, multiecho pulse sequences of the brain and surrounding structures were obtained without intravenous contrast. Angiographic images of the Circle of Willis were obtained using MRA technique without intravenous contrast. Angiographic images of the neck were obtained using MRA technique without and with intravenous contrast. Carotid stenosis measurements (when applicable) are obtained utilizing NASCET criteria, using the distal internal carotid diameter as the denominator. CONTRAST:  85mL MULTIHANCE GADOBENATE DIMEGLUMINE 529 MG/ML IV SOLN COMPARISON:  None. FINDINGS: MRI HEAD FINDINGS Mild diffuse prominence of the CSF containing spaces is compatible with generalized age related cerebral atrophy. Patchy and confluent T2/FLAIR hyperintensity within the periventricular and deep white matter both cerebral hemispheres most consistent with chronic small vessel ischemic disease, overall moderate nature. Chronic microvascular ischemic changes present within the pons. No abnormal foci of restricted diffusion to suggest acute or subacute ischemia. Gray-white matter differentiation maintained. No foci of susceptibility artifact to suggest acute or chronic intracranial hemorrhage. No encephalomalacia to suggest remote cortical infarction. No mass lesion, midline shift or mass effect. No hydrocephalus. No extra-axial fluid collection. Major dural sinuses are grossly patent. No abnormal enhancement. Pituitary gland suprasellar region normal. Midline structures intact and normal. Major intracranial vascular  flow voids are maintained. Craniocervical junction within normal limits. Upper cervical spine unremarkable. Bone marrow signal intensity within normal limits. No scalp soft tissue abnormality. Globes and oval soft tissues within normal limits. Paranasal sinuses are clear. No mastoid effusion. Inner ear structures normal. MRA HEAD FINDINGS ANTERIOR CIRCULATION: Distal cervical segments of the internal carotid arteries are widely patent with antegrade flow. Petrous, cavernous, and supraclinoid segments widely patent without stenosis. ICA termini widely patent. A1 segments patent bilaterally. Right A1 segment hypoplastic, which likely accounts for the slightly diminutive right ICA is compared to the left. Normal anterior communicating artery. Anterior cerebral arteries widely patent to their distal aspects without stenosis. M1 segments patent without stenosis or occlusion. Normal MCA bifurcations. No proximal M2 occlusion. Distal MCA branches well perfused and symmetric. POSTERIOR CIRCULATION: Vertebral arteries patent to the vertebrobasilar junction without stenosis. Left vertebral artery dominant. Posterior inferior cerebral arteries patent proximally. Basilar artery widely patent to its distal aspect. Superior cerebral arteries patent bilaterally. Both of the posterior  cerebral arteries supplied via the basilar and are well perfused to their distal aspects without stenosis. No aneurysm or vascular malformation. MRA NECK FINDINGS Source images reviewed. Visualized aortic arch of normal caliber with normal branch pattern. No flow-limiting stenosis about the origin of the great vessels. Partially visualized subclavian artery is widely patent without stenosis. Right common and internal carotid arteries widely patent without stenosis or occlusion. No significant atheromatous narrowing about the right carotid bifurcation. Left common and internal carotid artery is widely patent without stenosis or occlusion. No  significant atheromatous narrowing about the left carotid bifurcation. Both of the vertebral arteries arise from the subclavian arteries. Left vertebral artery dominant. Right vertebral artery diffusely diminutive/hypoplastic. Vertebral arteries patent within the neck without stenosis or occlusion. IMPRESSION: MRI HEAD IMPRESSION: 1. No acute intracranial infarct or other abnormality identified. 2. Generalized age-related cerebral atrophy with moderate chronic small vessel ischemic disease. MRA HEAD IMPRESSION: Normal intracranial MRA. No evidence for large vessel occlusion. No high-grade or correctable stenosis. MRA NECK IMPRESSION: Normal MRA of the neck. No high-grade or critical flow limiting stenosis identified. Electronically Signed   By: Jeannine Boga M.D.   On: 03/21/2017 21:20   Mr Cervical Spine Wo Contrast  Result Date: 03/24/2017 CLINICAL DATA:  76 year old female with several syncopal episodes. 5-6 month history of progressive dizziness. EXAM: MRI CERVICAL SPINE WITHOUT CONTRAST TECHNIQUE: Multiplanar, multisequence MR imaging of the cervical spine was performed. No intravenous contrast was administered. COMPARISON:  Brain MRI, head and neck MRA 03/21/2017. FINDINGS: Alignment: Straightening of lower cervical lordosis. Subtle anterolisthesis of C7 on T1. Preserved upper cervical lordosis. Vertebrae: No marrow edema or evidence of acute osseous abnormality. Visualized bone marrow signal is within normal limits. Degenerative endplate marrow signal changes at C5-C6 and C6-C7. Cord: Spinal cord signal is within normal limits at all visualized levels. Posterior Fossa, vertebral arteries, paraspinal tissues: Stable visible brain parenchyma. Preserved major vascular flow voids in the neck. Dominant left vertebral artery re- demonstrated. Negative neck soft tissues. Disc levels: C2-C3:  Negative. C3-C4: Mild disc bulge and endplate spurring. Mild facet and ligament flavum hypertrophy. No spinal  stenosis. Mild bilateral C4 foraminal stenosis. C4-C5: Moderate facet hypertrophy greater on the right. Minimal disc bulge and endplate spurring. No spinal stenosis. Moderate right C5 neural foraminal stenosis. C5-C6: Disc space loss with circumferential disc osteophyte complex. Broad-based posterior and bilateral foraminal components. Mild to moderate facet and ligament flavum hypertrophy. Spinal stenosis with no definite spinal cord mass effect. Moderate to severe bilateral C6 foraminal stenosis. C6-C7: Disc space loss with circumferential disc osteophyte complex. Broad-based posterior component in the midline. Mild to moderate ligament flavum hypertrophy. Spinal stenosis with no spinal cord mass effect. Mild bilateral C7 foraminal stenosis. C7-T1: Circumferential disc bulge and endplate spurring. Mild left and moderate right facet hypertrophy. Ligament flavum hypertrophy. No spinal stenosis. Mild right C8 neural foraminal stenosis. Upper thoracic facet hypertrophy. No upper thoracic spinal stenosis. IMPRESSION: 1. Lower cervical spine disc, endplate, and facet degeneration. 2. Multifactorial mild spinal stenosis at C5-C6 and C6-C7. No spinal cord signal abnormality, no definite cord mass effect. 3. Multifactorial moderate or severe right C5 and bilateral C6 nerve level foraminal stenosis. Electronically Signed   By: Genevie Ann M.D.   On: 03/24/2017 16:50   Dg Humerus Right  Result Date: 03/21/2017 CLINICAL DATA:  76 year old female with dizziness and fall today. Right side pain. EXAM: RIGHT HUMERUS - 2+ VIEW COMPARISON:  Chest CT 10/07/2016.  Chest radiographs 03/04/2015. FINDINGS: A small triangular ossific fragment  at the right acromioclavicular joint is chronic and unchanged from the CT earlier this year. Grossly normal alignment at the right shoulder. Bone mineralization is within normal limits for age. The right humerus appears intact. Grossly normal alignment at the right elbow. Visible right ribs and  lung parenchyma appear stable. IMPRESSION: No acute fracture or dislocation identified about the right humerus. Electronically Signed   By: Genevie Ann M.D.   On: 03/21/2017 13:20   Dg Hip Unilat W Or Wo Pelvis 2-3 Views Right  Result Date: 03/21/2017 CLINICAL DATA:  Dizziness, fall, right side pain EXAM: DG HIP (WITH OR WITHOUT PELVIS) 2-3V RIGHT COMPARISON:  None FINDINGS: Hip joints and SI joints are symmetric and unremarkable. No acute bony abnormality. Specifically, no fracture, subluxation, or dislocation. Soft tissues are intact. IMPRESSION: No acute bony abnormality. Electronically Signed   By: Rolm Baptise M.D.   On: 03/21/2017 13:20    Steve Rattler, DO 03/26/2017, 8:17 AM PGY-2, [place] Intern pager: [phone], text pages welcome

## 2017-03-27 ENCOUNTER — Encounter: Payer: Self-pay | Admitting: Neurology

## 2017-03-27 DIAGNOSIS — R042 Hemoptysis: Secondary | ICD-10-CM | POA: Diagnosis not present

## 2017-03-27 DIAGNOSIS — R2689 Other abnormalities of gait and mobility: Secondary | ICD-10-CM | POA: Diagnosis not present

## 2017-03-27 DIAGNOSIS — I714 Abdominal aortic aneurysm, without rupture: Secondary | ICD-10-CM | POA: Diagnosis not present

## 2017-03-27 DIAGNOSIS — R278 Other lack of coordination: Secondary | ICD-10-CM | POA: Diagnosis not present

## 2017-03-27 DIAGNOSIS — R55 Syncope and collapse: Secondary | ICD-10-CM | POA: Diagnosis not present

## 2017-03-27 DIAGNOSIS — R42 Dizziness and giddiness: Secondary | ICD-10-CM | POA: Diagnosis not present

## 2017-03-27 DIAGNOSIS — M1712 Unilateral primary osteoarthritis, left knee: Secondary | ICD-10-CM | POA: Diagnosis not present

## 2017-03-27 DIAGNOSIS — Z9181 History of falling: Secondary | ICD-10-CM | POA: Diagnosis not present

## 2017-03-27 DIAGNOSIS — M6281 Muscle weakness (generalized): Secondary | ICD-10-CM | POA: Diagnosis not present

## 2017-03-27 DIAGNOSIS — R911 Solitary pulmonary nodule: Secondary | ICD-10-CM | POA: Diagnosis not present

## 2017-03-27 DIAGNOSIS — M169 Osteoarthritis of hip, unspecified: Secondary | ICD-10-CM | POA: Diagnosis not present

## 2017-03-27 DIAGNOSIS — R2681 Unsteadiness on feet: Secondary | ICD-10-CM | POA: Diagnosis not present

## 2017-03-27 LAB — CBC
HCT: 43.5 % (ref 36.0–46.0)
Hemoglobin: 14.5 g/dL (ref 12.0–15.0)
MCH: 34.9 pg — ABNORMAL HIGH (ref 26.0–34.0)
MCHC: 33.3 g/dL (ref 30.0–36.0)
MCV: 104.8 fL — AB (ref 78.0–100.0)
Platelets: 199 10*3/uL (ref 150–400)
RBC: 4.15 MIL/uL (ref 3.87–5.11)
RDW: 13.2 % (ref 11.5–15.5)
WBC: 4.4 10*3/uL (ref 4.0–10.5)

## 2017-03-27 LAB — BASIC METABOLIC PANEL
Anion gap: 11 (ref 5–15)
BUN: 23 mg/dL — AB (ref 6–20)
CHLORIDE: 103 mmol/L (ref 101–111)
CO2: 25 mmol/L (ref 22–32)
Calcium: 9 mg/dL (ref 8.9–10.3)
Creatinine, Ser: 0.88 mg/dL (ref 0.44–1.00)
Glucose, Bld: 110 mg/dL — ABNORMAL HIGH (ref 65–99)
POTASSIUM: 3.4 mmol/L — AB (ref 3.5–5.1)
SODIUM: 139 mmol/L (ref 135–145)

## 2017-03-27 LAB — HIV ANTIBODY (ROUTINE TESTING W REFLEX): HIV SCREEN 4TH GENERATION: NONREACTIVE

## 2017-03-27 LAB — GLUCOSE, CAPILLARY: Glucose-Capillary: 121 mg/dL — ABNORMAL HIGH (ref 65–99)

## 2017-03-27 LAB — RPR: RPR: NONREACTIVE

## 2017-03-27 MED ORDER — VENLAFAXINE HCL ER 150 MG PO CP24: 150.0000 mg | ORAL_CAPSULE | Freq: Every day | ORAL | 0 refills | Status: AC

## 2017-03-27 MED ORDER — GABAPENTIN 300 MG PO CAPS: 300.0000 mg | ORAL_CAPSULE | Freq: Every day | ORAL | 0 refills | Status: DC

## 2017-03-27 MED ORDER — HYDROCHLOROTHIAZIDE 12.5 MG PO CAPS: 12.5000 mg | ORAL_CAPSULE | Freq: Every day | ORAL | 0 refills | Status: DC

## 2017-03-27 MED ORDER — GABAPENTIN 100 MG PO CAPS: 100.0000 mg | ORAL_CAPSULE | Freq: Two times a day (BID) | ORAL | 0 refills | Status: DC

## 2017-03-27 NOTE — Progress Notes (Signed)
Pt d/c to [facility]. Nurse tried to call report to receiving nurse, operator asked RN to leave a message for Beth to call back for report. Pt is stable no new concerns. D/C instructions done with teach back, pt verbalize understanding.

## 2017-03-27 NOTE — Consult Note (Signed)
[facility] CM Primary Care Navigator  03/27/2017  Cassandra Brooks 24-Sep-1940 [id number]   Met with patient, husband Cassandra Brooks) and daughter (Cassandra Brooks)at the bedside to identify possible discharge needs.  Patient reports "feeling dizzy, passed out and fell" that resultedto this admission.  Patient endorses Cassandra Brooks, [place] with [facility] as her primary caregiver.   Patient [facility] Brooks pharmacy in [place] to obtain medications without difficulty. Patient verbalizedthatshe has beenmanaging her own medications at homeusing "pill box" system filled once a week.  Patient's husband or daughter has beenprovidingtransportation to her doctors' appointments.  Patient lives with husbandat home who will serve as the primary caregiver. Daughter (lives 3 miles away) will also be able to assist with care as needed.   Anticipated discharge plan is skilled nursing facility (SNF-in process) per therapy recommendationfor short term rehabilitationprior to returning home.  Patient and family expressed understanding to callprimary care provider's officewhen shereturnsbackhomefor a post discharge follow-up appointment within1-2weeksor sooner if needed. Patient letter (with PCP's contact number) was provided as a reminder.  Explained to patientabout [facility] CM services available for health management at home but denies anyneeds or concerns at this time. Patient voiced understanding of need to seek referral from primary care provider to [facility] care management as deemed necessary for any services in the future.  [facility] care management information provided for future needs thatmay arise.   For additional questions please contact:  Edwena Felty A. Neel Buffone, BSN, RN-BC [facility] PRIMARY CARE Navigator Cell: [phone]

## 2017-03-27 NOTE — Clinical Social Work Note (Signed)
Clinical Social Work Assessment  Patient Details  Name: Cassandra Brooks MRN: [id number] Date of Birth: [birth year]  Date of referral:  03/26/17               Reason for consult:  Facility Placement, Discharge Planning                Permission sought to share information with:  Facility Sport and exercise psychologist, Family Supports Permission granted to share information::  Yes, Verbal Permission Granted  Name::     Cassandra Brooks, Cassandra Brooks::  SNF  Relationship::  Husband, Daughter  Contact Information:     Housing/Transportation Living arrangements for the past 2 months:  Single Family Home Source of Information:  Patient, Adult Children Patient Interpreter Needed:  None Criminal Activity/Legal Involvement Pertinent to Current Situation/Hospitalization:  No - Comment as needed Significant Relationships:  Adult Children, Spouse Lives with:  Self, Spouse Do you feel safe going back to the place where you live?  Yes Need for family participation in patient care:  No (Coment)  Care giving concerns:  Patient lives at home with spouse, but is unsure if the spouse alone is enough support to help her when she returns home at discharge. Patient would need a little bit more support than what she currently has available.   Social Worker assessment / plan:  CSW met with patient and daughter, Cassandra Brooks, at bedside to discuss discharge plan. CSW explained recommendation for SNF and engaged in discussion with patient and daughter. CSW explained SNF placement and referral process. CSW also provided information on [facility] as another option for the patient, as she would likely only need a very short stay at SNF before returning home. CSW provided information on the [facility] program, and indicated that there would still also be a referral process for that. CSW indicated that the referral process for both SNF and [facility] could be started, and patient and family would be kept informed of options available at  discharge. CSW completed referral for SNF and faxed out. CSW to alert RNCM of need for Home First referral, as well.  Employment status:  Retired Forensic scientist:  Medicare PT Recommendations:  [place] / Referral to community resources:  [place]  Patient/Family's Response to care:  Patient is agreeable to SNF placement, if absolutely necessary, but doesn't want to stay long so that she can be home for Christmas. Patient would greatly prefer being able to return home with some additional support instead.  Patient/Family's Understanding of and Emotional Response to Diagnosis, Current Treatment, and Prognosis:  Patient and daughter indicated that they're not very excited about the possibility of SNF placement at this time, so close to Christmas, but will go along with it for about a week if absolutely necessary. Patient acknowledged that she knows that she needs some additional help right now, but she is very motivated to improve quickly so that she can be home to have Christmas with her grandchildren. Patient and patient's daughter were also appreciative of information on the [facility] program, and are hopeful to seek out a referral for that instead. Patient's husband was contacted via phone and indicated that if he could have some additional caregiver support for a little bit during the day, then it would be much more manageable and safe to have the patient at home.   Emotional Assessment Appearance:  Appears stated age Attitude/Demeanor/Rapport:  Apprehensive Affect (typically observed):  Apprehensive, Appropriate, Frustrated Orientation:  Oriented to Self, Oriented  to Place, Oriented to  Time, Oriented to Situation Alcohol / Substance use:  Not Applicable Psych involvement (Current and /or in the community):  No (Comment)  Discharge Needs  Concerns to be addressed:  Care Coordination Readmission within the last 30 days:  No Current discharge  risk:  Physical Impairment, Dependent with Mobility Barriers to Discharge:  Continued Medical Work up   [employer], [place] 03/27/2017, 8:45 AM

## 2017-03-27 NOTE — Care Management Note (Signed)
Case Management Note  Patient Details  Name: Cassandra Brooks MRN: [id number] Date of Birth: 1940-07-25  Subjective/Objective:                    Action/Plan: Pt's mobility worse over the weekend. PT recommending SNF. Patient and her husband prefer [facility]. CSW updated.  The facility has a bed today. Pt will d/c to SNF today.  Family going to provide transportation.  CM called and cancelled the [facility] services.   Expected Discharge Date:                  Expected Discharge Plan:  [place]  In-House Referral:  Clinical Social Work  Discharge planning Services  CM Consult  Post Acute Care Choice:  Durable Medical Equipment, Home Health Choice offered to:  Patient  DME Arranged:  Walker rolling DME Agency:  [place] Arranged:  PT(vestibular) [place] Agency:  [place]  Status of Service:  Completed, signed off  If discussed at [place] of Stay Meetings, dates discussed:    Additional Comments:  Pollie Friar, RN 03/27/2017, 12:19 PM

## 2017-03-27 NOTE — Clinical Social Work Placement (Signed)
Nurse to call report to [phone], Room [place]  NOTE  Date:  03/27/2017  Patient Details  Name: Cassandra Brooks MRN: [id number] Date of Birth: [birth year]  Clinical Social Work is seeking post-discharge placement for this patient at the [place] level of care (*CSW will initial, date and re-position this form in  chart as items are completed):  Yes   Patient/family provided with [place] Work Department's list of facilities offering this level of care within the geographic area requested by the patient (or if unable, by the patient's family).  Yes   Patient/family informed of their freedom to choose among providers that offer the needed level of care, that participate in Medicare, Medicaid or managed care program needed by the patient, have an available bed and are willing to accept the patient.  Yes   Patient/family informed of [place]'s ownership interest in [facility] and [facility], as well as of the fact that they are under no obligation to receive care at these facilities.  PASRR submitted to EDS on 03/26/17     PASRR number received on 03/26/17     Existing PASRR number confirmed on       FL2 transmitted to all facilities in geographic area requested by pt/family on 03/26/17     FL2 transmitted to all facilities within larger geographic area on       Patient informed that his/her managed care company has contracts with or will negotiate with certain facilities, including the following:        Yes   Patient/family informed of bed offers received.  Patient chooses bed at [facility]     Physician recommends and patient chooses bed at      Patient to be transferred to [facility] on 03/27/17.  Patient to be transferred to facility by Family car     Patient family notified on 03/27/17 of transfer.  Name of family member notified:  [name], Ozzie      PHYSICIAN       Additional Comment:    _______________________________________________ Geralynn Ochs, [place] 03/27/2017, 1:18 PM

## 2017-03-27 NOTE — Progress Notes (Signed)
Physical Therapy Treatment Patient Details Name: Cassandra Brooks MRN: [id number] DOB: 08/03/1940 Today's Date: 03/27/2017    History of Present Illness Patient is a 76 y/o female who presents with dizziness and syncope episodes at home associated with slurred speech and shaking episodes. Head CT, Brain MRI/MRA unremarkable. CXR- bibasilar atelectasis. PMH includes HTN, AAA, glaucoma bil eyes, depression, fibromyalgia.     PT Comments    Patient progressing slowly towards PT goals. Dizziness still present but seems improved from last week. Tolerated gait training with use of RW and Min guard-Min A at times for safety secondary to dizziness. Used visual targets for gaze stabilization esp with turns and education to limit fast head movements etc. Reviewed x1 exercises with patient and encouraged performing them a few times per day. Would really benefit from [facility] program. Will follow.    Follow Up Recommendations  SNF;Supervision for mobility/OOB     Equipment Recommendations  Rolling walker with 5" wheels    Recommendations for Other Services       Precautions / Restrictions Precautions Precautions: Fall Precaution Comments: Dizziness Restrictions Weight Bearing Restrictions: No    Mobility  Bed Mobility           Sit to supine: Supervision;HOB elevated   General bed mobility comments: Up in chair upon PT arrival. Returned to supine post ambulation due to headache.  Transfers Overall transfer level: Needs assistance Equipment used: Rolling walker (2 wheeled) Transfers: Sit to/from Stand Sit to Stand: Min guard         General transfer comment: Used visual target while moving to stance to minimize dizziness.  No physical assist needed during transfers today. Stood from chair x2 with cues for hand placement/technique.  Ambulation/Gait Ambulation/Gait assistance: Min assist;Min guard Ambulation Distance (Feet): 140 Feet(x2 bouts) Assistive device: Rolling  walker (2 wheeled) Gait Pattern/deviations: Step-through pattern;Decreased stride length;Narrow base of support;Staggering left Gait velocity: decreased Gait velocity interpretation: Below normal speed for age/gender General Gait Details: Slow, unsteady gait veering towards left side of RW; cues for gaze stabilization using visual targets esp with turns. Min A at times to regain balance- esp with busy environments or turns.    Stairs            Wheelchair Mobility    Modified Rankin (Stroke Patients Only) Modified Rankin (Stroke Patients Only) Pre-Morbid Rankin Score: Slight disability Modified Rankin: Moderately severe disability     Balance Overall balance assessment: Needs assistance;History of Falls Sitting-balance support: Feet supported;No upper extremity supported Sitting balance-Leahy Scale: Fair Sitting balance - Comments: Can maintain static sitting balance.  Requires support for any dynamic activity   Standing balance support: During functional activity;Bilateral upper extremity supported Standing balance-Leahy Scale: Poor Standing balance comment: Requires UE support for balance due to dizziness.                            Cognition Arousal/Alertness: Awake/alert Behavior During Therapy: WFL for tasks assessed/performed Overall Cognitive Status: Within Functional Limits for tasks assessed                                 General Comments: Masks symptoms at times.       Exercises Other Exercises Other Exercises: Performed x1 exercises horizontally and vertically 30 sec    General Comments General comments (skin integrity, edema, etc.): Daughter and husband present during session.  Pertinent Vitals/Pain Pain Assessment: Faces Faces Pain Scale: Hurts a little bit Pain Location: headache Pain Descriptors / Indicators: Headache;Aching;Dull Pain Intervention(s): Monitored during session;Premedicated before session    Home  Living                      Prior Function            PT Goals (current goals can now be found in the care plan section) Progress towards PT goals: Progressing toward goals    Frequency    Min 3X/week      PT Plan Current plan remains appropriate    Co-evaluation              AM-PAC PT "6 Clicks" Daily Activity  Outcome Measure  Difficulty turning over in bed (including adjusting bedclothes, sheets and blankets)?: None Difficulty moving from lying on back to sitting on the side of the bed? : None Difficulty sitting down on and standing up from a chair with arms (e.g., wheelchair, bedside commode, etc,.)?: A Little Help needed moving to and from a bed to chair (including a wheelchair)?: A Little Help needed walking in hospital room?: A Little Help needed climbing 3-5 steps with a railing? : A Lot 6 Click Score: 19    End of Session Equipment Utilized During Treatment: Gait belt Activity Tolerance: Patient tolerated treatment well;Treatment limited secondary to medical complications (Comment)(dizziness) Patient left: in bed;with family/visitor present;with call bell/phone within reach Nurse Communication: Mobility status PT Visit Diagnosis: Unsteadiness on feet (R26.81);Other abnormalities of gait and mobility (R26.89);History of falling (Z91.81);Dizziness and giddiness (R42)     Time: 1001-1020 PT Time Calculation (min) (ACUTE ONLY): 19 min  Charges:  $Gait Training: 8-22 mins                    G Codes:       Wray Kearns, PT, DPT [phone]     [place] 03/27/2017, 10:27 AM

## 2017-03-27 NOTE — Progress Notes (Signed)
Family Medicine Teaching Service Daily Progress Note Intern Pager: [phone]  Patient name: Cassandra Brooks Medical record number: [id number] Date of birth: Jan 15, 1941 Age: 76 y.o. Gender: female  Primary Care Provider: Lowella Dandy, NP Consultants: neurology, cardiology, PT/OT Code Status: full  Pt Overview and Major Events to Date:  Admitted to [facility] on 03/21/17  Assessment and Plan: Cassandra Montgomeryis a 76 y.o.femalepresenting with dizziness and syncope. PMH is significant forHTN, glaucoma, GERD, HLD, OA, and anxiety  Syncopal episodes- persistent with no clear explanation. Spinal stenosis seen on MRI spine of C5-C7 Have ruled out stroke, intracranial abnormality, seizure activity, MI, and orthostatic. MRI of cervical spine showing mild spinal stenosis of C5-6 and C6-7, and moderate/severe right C5 and bilateraly C6 nerve level foraminal stenosis. Neuro exam largely normal except for focal left leg weakness, which is likelypost-polio syndrome.RPR non-reactive, so unlikely related to syphilis.  -monitor on telemetry, will need outpatient event monitor -neurology signed off, Recommend physical therapy and referral to Dr. Carles Collet as outpatient.  -cardiology has evaluated patient. Recommend lowering effexor dose and referral to vestibular PT, will have outpatient follow up for event monitor vs loop recorder at future date   Macrocytosis Stable. Seen on labs, MCV105.2 on admission. MCV today of105.9.Unclear etiology as no alcohol use, B12 wnl, TSH wnl, and folate wnl. Slightly elevated AST of 45, ALT wnl. Macrocytosis may or may not contribute to ataxia.  -consider outpatient work up  HTN Current BP146/75. Home meds: amlodipine  -continue norvasc 10mg , continue HCTZ -monitor BP  HLD Home meds: pravachol 20 mg -continuehome medication  Anxiety Home meds: 225 mg effexor daily. Anxiety possibly contributing to "shaking episodes" -continue effexor at lower dose 150  mg  Glaucoma Home meds: cosopt eye drops -continue home eye drops  GERD Home meds: prilosec daily -continue PPI  OAof spine, hips, knees Home meds: nabumetone at home -hold NSAIDdue to mildly elevated Cr, restart as able -tylenol as needed for pain -gapapentin on 300mg  at bedtime, and 100mg  bid with breakfast and lunch   FEN/GI:regular diet Prophylaxis:lovenox  Disposition: SNF  Subjective:  Patient today overall feels well. Patient is ready for discharge. Family states they spoke to social work who explained patient will be part of a pilot program to have in home 24-hour supervision and therapy. Patient has scheduled neurology follow up on 04/20/17 with Dr. Carles Collet @ 9:30am.   Objective: Temp:  [98.4 F (36.9 C)-98.7 F (37.1 C)] 98.4 F (36.9 C) (12/17 0512) Pulse Rate:  [64-80] 77 (12/17 0512) Resp:  [16-20] 18 (12/17 0512) BP: (140-153)/(58-82) 146/75 (12/17 0512) SpO2:  [92 %-98 %] 94 % (12/17 0512) Physical Exam: General: awake and alert, NAD, sitting up in chair  Cardiovascular: RRR, no MRG Respiratory: CTAB, no wheezes, rales, or rhonchi  Abdomen: soft, non tender, non distended, bowel sounds normal  Extremities: 5/5 muscle strength bilaterally Neuro: CN2-12 grossly intact, sensation intact bilaterally, normal grip strength   Laboratory: Recent Labs  Lab 03/25/17 0254 03/26/17 0443 03/27/17 0456  WBC 5.4 4.6 4.4  HGB 14.6 14.9 14.5  HCT 44.2 44.9 43.5  PLT 199 195 199   Recent Labs  Lab 03/25/17 0254 03/26/17 0443 03/26/17 1211 03/27/17 0456  NA 138 139  --  139  K 3.4* 3.5  --  3.4*  CL 102 101  --  103  CO2 27 27  --  25  BUN 20 23*  --  23*  CREATININE 1.06* 0.97  --  0.88  CALCIUM 9.0 8.9  --  9.0  PROT  --   --  6.7  --   BILITOT  --   --  0.4  --   ALKPHOS  --   --  67  --   ALT  --   --  42  --   AST  --   --  45*  --   GLUCOSE 121* 113*  --  110*     Ref. Range 03/26/2017 12:11  Alkaline Phosphatase Latest Ref Range: 38 -  126 U/L 67  Albumin Latest Ref Range: 3.5 - 5.0 g/dL 3.3 (L)  AST Latest Ref Range: 15 - 41 U/L 45 (H)  ALT Latest Ref Range: 14 - 54 U/L 42  Total Protein Latest Ref Range: 6.5 - 8.1 g/dL 6.7  Bilirubin, Direct Latest Ref Range: 0.1 - 0.5 mg/dL <0.1 (L)  Indirect Bilirubin Latest Ref Range: 0.3 - 0.9 mg/dL NOT CALCULATED  Total Bilirubin Latest Ref Range: 0.3 - 1.2 mg/dL 0.4    Ref. Range 03/26/2017 12:11  RPR Latest Ref Range: Non Reactive  Non Reactive   Imaging/Diagnostic Tests: Dg Chest 2 View  Result Date: 03/21/2017 CLINICAL DATA:  Dizziness, fall EXAM: CHEST  2 VIEW COMPARISON:  Chest CT 10/07/2016 FINDINGS: Mild peribronchial thickening. Bibasilar atelectasis. Heart is normal size. No effusions or acute bony abnormality. IMPRESSION: Mild bronchitic changes, bibasilar atelectasis. Electronically Signed   By: Rolm Baptise M.D.   On: 03/21/2017 13:18   Ct Head Wo Contrast  Result Date: 03/21/2017 CLINICAL DATA:  76 year old female with multiple syncopal episodes and falls. Denies headache. Initial encounter. EXAM: CT HEAD WITHOUT CONTRAST TECHNIQUE: Contiguous axial images were obtained from the base of the skull through the vertex without intravenous contrast. COMPARISON:  None. FINDINGS: Brain: No intracranial hemorrhage or CT evidence of large acute infarct. Mild chronic microvascular changes. Mild global atrophy without hydrocephalus. No intracranial mass lesion noted on this unenhanced exam. Vascular: Vascular calcifications Skull: No skull fracture Sinuses/Orbits: No acute orbital abnormality. Visualized paranasal sinuses are clear. Other: Mastoid air cells and middle ear cavities are clear. Temporomandibular joint degenerative changes. IMPRESSION: Normal for age unenhanced head CT. Electronically Signed   By: Genia Del M.D.   On: 03/21/2017 13:06   Mr Angiogram Head W Or Wo Contrast  Result Date: 03/21/2017 CLINICAL DATA:  Initial evaluation for acute dizziness, syncope,  ataxia, stroke suspected. EXAM: MRI HEAD WITHOUT AND WITH CONTRAST MRA HEAD WITHOUT CONTRAST MRA NECK WITHOUT AND WITH CONTRAST TECHNIQUE: Multiplanar, multiecho pulse sequences of the brain and surrounding structures were obtained without intravenous contrast. Angiographic images of the Circle of Willis were obtained using MRA technique without intravenous contrast. Angiographic images of the neck were obtained using MRA technique without and with intravenous contrast. Carotid stenosis measurements (when applicable) are obtained utilizing NASCET criteria, using the distal internal carotid diameter as the denominator. CONTRAST:  31mL MULTIHANCE GADOBENATE DIMEGLUMINE 529 MG/ML IV SOLN COMPARISON:  None. FINDINGS: MRI HEAD FINDINGS Mild diffuse prominence of the CSF containing spaces is compatible with generalized age related cerebral atrophy. Patchy and confluent T2/FLAIR hyperintensity within the periventricular and deep white matter both cerebral hemispheres most consistent with chronic small vessel ischemic disease, overall moderate nature. Chronic microvascular ischemic changes present within the pons. No abnormal foci of restricted diffusion to suggest acute or subacute ischemia. Gray-white matter differentiation maintained. No foci of susceptibility artifact to suggest acute or chronic intracranial hemorrhage. No encephalomalacia to suggest remote cortical infarction. No mass lesion, midline shift or mass effect. No hydrocephalus.  No extra-axial fluid collection. Major dural sinuses are grossly patent. No abnormal enhancement. Pituitary gland suprasellar region normal. Midline structures intact and normal. Major intracranial vascular flow voids are maintained. Craniocervical junction within normal limits. Upper cervical spine unremarkable. Bone marrow signal intensity within normal limits. No scalp soft tissue abnormality. Globes and oval soft tissues within normal limits. Paranasal sinuses are clear. No  mastoid effusion. Inner ear structures normal. MRA HEAD FINDINGS ANTERIOR CIRCULATION: Distal cervical segments of the internal carotid arteries are widely patent with antegrade flow. Petrous, cavernous, and supraclinoid segments widely patent without stenosis. ICA termini widely patent. A1 segments patent bilaterally. Right A1 segment hypoplastic, which likely accounts for the slightly diminutive right ICA is compared to the left. Normal anterior communicating artery. Anterior cerebral arteries widely patent to their distal aspects without stenosis. M1 segments patent without stenosis or occlusion. Normal MCA bifurcations. No proximal M2 occlusion. Distal MCA branches well perfused and symmetric. POSTERIOR CIRCULATION: Vertebral arteries patent to the vertebrobasilar junction without stenosis. Left vertebral artery dominant. Posterior inferior cerebral arteries patent proximally. Basilar artery widely patent to its distal aspect. Superior cerebral arteries patent bilaterally. Both of the posterior cerebral arteries supplied via the basilar and are well perfused to their distal aspects without stenosis. No aneurysm or vascular malformation. MRA NECK FINDINGS Source images reviewed. Visualized aortic arch of normal caliber with normal branch pattern. No flow-limiting stenosis about the origin of the great vessels. Partially visualized subclavian artery is widely patent without stenosis. Right common and internal carotid arteries widely patent without stenosis or occlusion. No significant atheromatous narrowing about the right carotid bifurcation. Left common and internal carotid artery is widely patent without stenosis or occlusion. No significant atheromatous narrowing about the left carotid bifurcation. Both of the vertebral arteries arise from the subclavian arteries. Left vertebral artery dominant. Right vertebral artery diffusely diminutive/hypoplastic. Vertebral arteries patent within the neck without stenosis  or occlusion. IMPRESSION: MRI HEAD IMPRESSION: 1. No acute intracranial infarct or other abnormality identified. 2. Generalized age-related cerebral atrophy with moderate chronic small vessel ischemic disease. MRA HEAD IMPRESSION: Normal intracranial MRA. No evidence for large vessel occlusion. No high-grade or correctable stenosis. MRA NECK IMPRESSION: Normal MRA of the neck. No high-grade or critical flow limiting stenosis identified. Electronically Signed   By: Jeannine Boga M.D.   On: 03/21/2017 21:20   Mr Angiogram Neck W Or Wo Contrast  Result Date: 03/21/2017 CLINICAL DATA:  Initial evaluation for acute dizziness, syncope, ataxia, stroke suspected. EXAM: MRI HEAD WITHOUT AND WITH CONTRAST MRA HEAD WITHOUT CONTRAST MRA NECK WITHOUT AND WITH CONTRAST TECHNIQUE: Multiplanar, multiecho pulse sequences of the brain and surrounding structures were obtained without intravenous contrast. Angiographic images of the Circle of Willis were obtained using MRA technique without intravenous contrast. Angiographic images of the neck were obtained using MRA technique without and with intravenous contrast. Carotid stenosis measurements (when applicable) are obtained utilizing NASCET criteria, using the distal internal carotid diameter as the denominator. CONTRAST:  51mL MULTIHANCE GADOBENATE DIMEGLUMINE 529 MG/ML IV SOLN COMPARISON:  None. FINDINGS: MRI HEAD FINDINGS Mild diffuse prominence of the CSF containing spaces is compatible with generalized age related cerebral atrophy. Patchy and confluent T2/FLAIR hyperintensity within the periventricular and deep white matter both cerebral hemispheres most consistent with chronic small vessel ischemic disease, overall moderate nature. Chronic microvascular ischemic changes present within the pons. No abnormal foci of restricted diffusion to suggest acute or subacute ischemia. Gray-white matter differentiation maintained. No foci of susceptibility artifact to suggest  acute or  chronic intracranial hemorrhage. No encephalomalacia to suggest remote cortical infarction. No mass lesion, midline shift or mass effect. No hydrocephalus. No extra-axial fluid collection. Major dural sinuses are grossly patent. No abnormal enhancement. Pituitary gland suprasellar region normal. Midline structures intact and normal. Major intracranial vascular flow voids are maintained. Craniocervical junction within normal limits. Upper cervical spine unremarkable. Bone marrow signal intensity within normal limits. No scalp soft tissue abnormality. Globes and oval soft tissues within normal limits. Paranasal sinuses are clear. No mastoid effusion. Inner ear structures normal. MRA HEAD FINDINGS ANTERIOR CIRCULATION: Distal cervical segments of the internal carotid arteries are widely patent with antegrade flow. Petrous, cavernous, and supraclinoid segments widely patent without stenosis. ICA termini widely patent. A1 segments patent bilaterally. Right A1 segment hypoplastic, which likely accounts for the slightly diminutive right ICA is compared to the left. Normal anterior communicating artery. Anterior cerebral arteries widely patent to their distal aspects without stenosis. M1 segments patent without stenosis or occlusion. Normal MCA bifurcations. No proximal M2 occlusion. Distal MCA branches well perfused and symmetric. POSTERIOR CIRCULATION: Vertebral arteries patent to the vertebrobasilar junction without stenosis. Left vertebral artery dominant. Posterior inferior cerebral arteries patent proximally. Basilar artery widely patent to its distal aspect. Superior cerebral arteries patent bilaterally. Both of the posterior cerebral arteries supplied via the basilar and are well perfused to their distal aspects without stenosis. No aneurysm or vascular malformation. MRA NECK FINDINGS Source images reviewed. Visualized aortic arch of normal caliber with normal branch pattern. No flow-limiting stenosis about  the origin of the great vessels. Partially visualized subclavian artery is widely patent without stenosis. Right common and internal carotid arteries widely patent without stenosis or occlusion. No significant atheromatous narrowing about the right carotid bifurcation. Left common and internal carotid artery is widely patent without stenosis or occlusion. No significant atheromatous narrowing about the left carotid bifurcation. Both of the vertebral arteries arise from the subclavian arteries. Left vertebral artery dominant. Right vertebral artery diffusely diminutive/hypoplastic. Vertebral arteries patent within the neck without stenosis or occlusion. IMPRESSION: MRI HEAD IMPRESSION: 1. No acute intracranial infarct or other abnormality identified. 2. Generalized age-related cerebral atrophy with moderate chronic small vessel ischemic disease. MRA HEAD IMPRESSION: Normal intracranial MRA. No evidence for large vessel occlusion. No high-grade or correctable stenosis. MRA NECK IMPRESSION: Normal MRA of the neck. No high-grade or critical flow limiting stenosis identified. Electronically Signed   By: Jeannine Boga M.D.   On: 03/21/2017 21:20   Mr Jeri Cos And Wo Contrast  Result Date: 03/21/2017 CLINICAL DATA:  Initial evaluation for acute dizziness, syncope, ataxia, stroke suspected. EXAM: MRI HEAD WITHOUT AND WITH CONTRAST MRA HEAD WITHOUT CONTRAST MRA NECK WITHOUT AND WITH CONTRAST TECHNIQUE: Multiplanar, multiecho pulse sequences of the brain and surrounding structures were obtained without intravenous contrast. Angiographic images of the Circle of Willis were obtained using MRA technique without intravenous contrast. Angiographic images of the neck were obtained using MRA technique without and with intravenous contrast. Carotid stenosis measurements (when applicable) are obtained utilizing NASCET criteria, using the distal internal carotid diameter as the denominator. CONTRAST:  7mL MULTIHANCE  GADOBENATE DIMEGLUMINE 529 MG/ML IV SOLN COMPARISON:  None. FINDINGS: MRI HEAD FINDINGS Mild diffuse prominence of the CSF containing spaces is compatible with generalized age related cerebral atrophy. Patchy and confluent T2/FLAIR hyperintensity within the periventricular and deep white matter both cerebral hemispheres most consistent with chronic small vessel ischemic disease, overall moderate nature. Chronic microvascular ischemic changes present within the pons. No abnormal foci of restricted diffusion to  suggest acute or subacute ischemia. Gray-white matter differentiation maintained. No foci of susceptibility artifact to suggest acute or chronic intracranial hemorrhage. No encephalomalacia to suggest remote cortical infarction. No mass lesion, midline shift or mass effect. No hydrocephalus. No extra-axial fluid collection. Major dural sinuses are grossly patent. No abnormal enhancement. Pituitary gland suprasellar region normal. Midline structures intact and normal. Major intracranial vascular flow voids are maintained. Craniocervical junction within normal limits. Upper cervical spine unremarkable. Bone marrow signal intensity within normal limits. No scalp soft tissue abnormality. Globes and oval soft tissues within normal limits. Paranasal sinuses are clear. No mastoid effusion. Inner ear structures normal. MRA HEAD FINDINGS ANTERIOR CIRCULATION: Distal cervical segments of the internal carotid arteries are widely patent with antegrade flow. Petrous, cavernous, and supraclinoid segments widely patent without stenosis. ICA termini widely patent. A1 segments patent bilaterally. Right A1 segment hypoplastic, which likely accounts for the slightly diminutive right ICA is compared to the left. Normal anterior communicating artery. Anterior cerebral arteries widely patent to their distal aspects without stenosis. M1 segments patent without stenosis or occlusion. Normal MCA bifurcations. No proximal M2 occlusion.  Distal MCA branches well perfused and symmetric. POSTERIOR CIRCULATION: Vertebral arteries patent to the vertebrobasilar junction without stenosis. Left vertebral artery dominant. Posterior inferior cerebral arteries patent proximally. Basilar artery widely patent to its distal aspect. Superior cerebral arteries patent bilaterally. Both of the posterior cerebral arteries supplied via the basilar and are well perfused to their distal aspects without stenosis. No aneurysm or vascular malformation. MRA NECK FINDINGS Source images reviewed. Visualized aortic arch of normal caliber with normal branch pattern. No flow-limiting stenosis about the origin of the great vessels. Partially visualized subclavian artery is widely patent without stenosis. Right common and internal carotid arteries widely patent without stenosis or occlusion. No significant atheromatous narrowing about the right carotid bifurcation. Left common and internal carotid artery is widely patent without stenosis or occlusion. No significant atheromatous narrowing about the left carotid bifurcation. Both of the vertebral arteries arise from the subclavian arteries. Left vertebral artery dominant. Right vertebral artery diffusely diminutive/hypoplastic. Vertebral arteries patent within the neck without stenosis or occlusion. IMPRESSION: MRI HEAD IMPRESSION: 1. No acute intracranial infarct or other abnormality identified. 2. Generalized age-related cerebral atrophy with moderate chronic small vessel ischemic disease. MRA HEAD IMPRESSION: Normal intracranial MRA. No evidence for large vessel occlusion. No high-grade or correctable stenosis. MRA NECK IMPRESSION: Normal MRA of the neck. No high-grade or critical flow limiting stenosis identified. Electronically Signed   By: Jeannine Boga M.D.   On: 03/21/2017 21:20   Mr Cervical Spine Wo Contrast  Result Date: 03/24/2017 CLINICAL DATA:  76 year old female with several syncopal episodes. 5-6 month  history of progressive dizziness. EXAM: MRI CERVICAL SPINE WITHOUT CONTRAST TECHNIQUE: Multiplanar, multisequence MR imaging of the cervical spine was performed. No intravenous contrast was administered. COMPARISON:  Brain MRI, head and neck MRA 03/21/2017. FINDINGS: Alignment: Straightening of lower cervical lordosis. Subtle anterolisthesis of C7 on T1. Preserved upper cervical lordosis. Vertebrae: No marrow edema or evidence of acute osseous abnormality. Visualized bone marrow signal is within normal limits. Degenerative endplate marrow signal changes at C5-C6 and C6-C7. Cord: Spinal cord signal is within normal limits at all visualized levels. Posterior Fossa, vertebral arteries, paraspinal tissues: Stable visible brain parenchyma. Preserved major vascular flow voids in the neck. Dominant left vertebral artery re- demonstrated. Negative neck soft tissues. Disc levels: C2-C3:  Negative. C3-C4: Mild disc bulge and endplate spurring. Mild facet and ligament flavum hypertrophy. No spinal stenosis.  Mild bilateral C4 foraminal stenosis. C4-C5: Moderate facet hypertrophy greater on the right. Minimal disc bulge and endplate spurring. No spinal stenosis. Moderate right C5 neural foraminal stenosis. C5-C6: Disc space loss with circumferential disc osteophyte complex. Broad-based posterior and bilateral foraminal components. Mild to moderate facet and ligament flavum hypertrophy. Spinal stenosis with no definite spinal cord mass effect. Moderate to severe bilateral C6 foraminal stenosis. C6-C7: Disc space loss with circumferential disc osteophyte complex. Broad-based posterior component in the midline. Mild to moderate ligament flavum hypertrophy. Spinal stenosis with no spinal cord mass effect. Mild bilateral C7 foraminal stenosis. C7-T1: Circumferential disc bulge and endplate spurring. Mild left and moderate right facet hypertrophy. Ligament flavum hypertrophy. No spinal stenosis. Mild right C8 neural foraminal  stenosis. Upper thoracic facet hypertrophy. No upper thoracic spinal stenosis. IMPRESSION: 1. Lower cervical spine disc, endplate, and facet degeneration. 2. Multifactorial mild spinal stenosis at C5-C6 and C6-C7. No spinal cord signal abnormality, no definite cord mass effect. 3. Multifactorial moderate or severe right C5 and bilateral C6 nerve level foraminal stenosis. Electronically Signed   By: Genevie Ann M.D.   On: 03/24/2017 16:50   Dg Humerus Right  Result Date: 03/21/2017 CLINICAL DATA:  76 year old female with dizziness and fall today. Right side pain. EXAM: RIGHT HUMERUS - 2+ VIEW COMPARISON:  Chest CT 10/07/2016.  Chest radiographs 03/04/2015. FINDINGS: A small triangular ossific fragment at the right acromioclavicular joint is chronic and unchanged from the CT earlier this year. Grossly normal alignment at the right shoulder. Bone mineralization is within normal limits for age. The right humerus appears intact. Grossly normal alignment at the right elbow. Visible right ribs and lung parenchyma appear stable. IMPRESSION: No acute fracture or dislocation identified about the right humerus. Electronically Signed   By: Genevie Ann M.D.   On: 03/21/2017 13:20   Dg Hip Unilat W Or Wo Pelvis 2-3 Views Right  Result Date: 03/21/2017 CLINICAL DATA:  Dizziness, fall, right side pain EXAM: DG HIP (WITH OR WITHOUT PELVIS) 2-3V RIGHT COMPARISON:  None FINDINGS: Hip joints and SI joints are symmetric and unremarkable. No acute bony abnormality. Specifically, no fracture, subluxation, or dislocation. Soft tissues are intact. IMPRESSION: No acute bony abnormality. Electronically Signed   By: Rolm Baptise M.D.   On: 03/21/2017 13:20    Caroline More, DO 03/27/2017, 9:09 AM PGY-1, [facility] Intern pager: [phone], text pages welcome

## 2017-03-27 NOTE — Care Management Important Message (Signed)
Important Message  Patient Details  Name: Basia Mcginty MRN: [id number] Date of Birth: 1940/08/15   Medicare Important Message Given:  Yes    Dreshon Proffit Abena 03/27/2017, 10:07 AM

## 2017-04-05 DIAGNOSIS — E785 Hyperlipidemia, unspecified: Secondary | ICD-10-CM | POA: Diagnosis not present

## 2017-04-05 DIAGNOSIS — R278 Other lack of coordination: Secondary | ICD-10-CM | POA: Diagnosis not present

## 2017-04-05 DIAGNOSIS — I10 Essential (primary) hypertension: Secondary | ICD-10-CM | POA: Diagnosis not present

## 2017-04-05 DIAGNOSIS — I714 Abdominal aortic aneurysm, without rupture: Secondary | ICD-10-CM | POA: Diagnosis not present

## 2017-04-05 DIAGNOSIS — Z6832 Body mass index (BMI) 32.0-32.9, adult: Secondary | ICD-10-CM | POA: Diagnosis not present

## 2017-04-05 DIAGNOSIS — M6281 Muscle weakness (generalized): Secondary | ICD-10-CM | POA: Diagnosis not present

## 2017-04-05 DIAGNOSIS — M1712 Unilateral primary osteoarthritis, left knee: Secondary | ICD-10-CM | POA: Diagnosis not present

## 2017-04-05 DIAGNOSIS — R55 Syncope and collapse: Secondary | ICD-10-CM | POA: Diagnosis not present

## 2017-04-05 DIAGNOSIS — F329 Major depressive disorder, single episode, unspecified: Secondary | ICD-10-CM | POA: Diagnosis not present

## 2017-04-05 DIAGNOSIS — M1990 Unspecified osteoarthritis, unspecified site: Secondary | ICD-10-CM | POA: Diagnosis not present

## 2017-04-05 DIAGNOSIS — R42 Dizziness and giddiness: Secondary | ICD-10-CM | POA: Diagnosis not present

## 2017-04-05 DIAGNOSIS — M169 Osteoarthritis of hip, unspecified: Secondary | ICD-10-CM | POA: Diagnosis not present

## 2017-04-05 DIAGNOSIS — Z9181 History of falling: Secondary | ICD-10-CM | POA: Diagnosis not present

## 2017-04-05 DIAGNOSIS — F419 Anxiety disorder, unspecified: Secondary | ICD-10-CM | POA: Diagnosis not present

## 2017-04-05 DIAGNOSIS — K219 Gastro-esophageal reflux disease without esophagitis: Secondary | ICD-10-CM | POA: Diagnosis not present

## 2017-04-05 DIAGNOSIS — R262 Difficulty in walking, not elsewhere classified: Secondary | ICD-10-CM | POA: Diagnosis not present

## 2017-04-05 DIAGNOSIS — H409 Unspecified glaucoma: Secondary | ICD-10-CM | POA: Diagnosis not present

## 2017-04-06 DIAGNOSIS — M5431 Sciatica, right side: Secondary | ICD-10-CM | POA: Diagnosis not present

## 2017-04-06 DIAGNOSIS — M545 Low back pain: Secondary | ICD-10-CM | POA: Diagnosis not present

## 2017-04-10 DIAGNOSIS — R262 Difficulty in walking, not elsewhere classified: Secondary | ICD-10-CM | POA: Diagnosis not present

## 2017-04-10 DIAGNOSIS — H409 Unspecified glaucoma: Secondary | ICD-10-CM | POA: Diagnosis not present

## 2017-04-10 DIAGNOSIS — E785 Hyperlipidemia, unspecified: Secondary | ICD-10-CM | POA: Diagnosis not present

## 2017-04-10 DIAGNOSIS — I10 Essential (primary) hypertension: Secondary | ICD-10-CM | POA: Diagnosis not present

## 2017-04-10 DIAGNOSIS — R55 Syncope and collapse: Secondary | ICD-10-CM | POA: Diagnosis not present

## 2017-04-10 DIAGNOSIS — F329 Major depressive disorder, single episode, unspecified: Secondary | ICD-10-CM | POA: Diagnosis not present

## 2017-04-13 DIAGNOSIS — Z6832 Body mass index (BMI) 32.0-32.9, adult: Secondary | ICD-10-CM | POA: Diagnosis not present

## 2017-04-13 DIAGNOSIS — F329 Major depressive disorder, single episode, unspecified: Secondary | ICD-10-CM | POA: Diagnosis not present

## 2017-04-13 DIAGNOSIS — M1990 Unspecified osteoarthritis, unspecified site: Secondary | ICD-10-CM | POA: Diagnosis not present

## 2017-04-13 DIAGNOSIS — R262 Difficulty in walking, not elsewhere classified: Secondary | ICD-10-CM | POA: Diagnosis not present

## 2017-04-13 DIAGNOSIS — H409 Unspecified glaucoma: Secondary | ICD-10-CM | POA: Diagnosis not present

## 2017-04-13 DIAGNOSIS — R55 Syncope and collapse: Secondary | ICD-10-CM | POA: Diagnosis not present

## 2017-04-13 DIAGNOSIS — E785 Hyperlipidemia, unspecified: Secondary | ICD-10-CM | POA: Diagnosis not present

## 2017-04-13 DIAGNOSIS — I10 Essential (primary) hypertension: Secondary | ICD-10-CM | POA: Diagnosis not present

## 2017-04-14 DIAGNOSIS — R55 Syncope and collapse: Secondary | ICD-10-CM | POA: Diagnosis not present

## 2017-04-14 DIAGNOSIS — E785 Hyperlipidemia, unspecified: Secondary | ICD-10-CM | POA: Diagnosis not present

## 2017-04-14 DIAGNOSIS — R262 Difficulty in walking, not elsewhere classified: Secondary | ICD-10-CM | POA: Diagnosis not present

## 2017-04-14 DIAGNOSIS — F329 Major depressive disorder, single episode, unspecified: Secondary | ICD-10-CM | POA: Diagnosis not present

## 2017-04-14 DIAGNOSIS — H409 Unspecified glaucoma: Secondary | ICD-10-CM | POA: Diagnosis not present

## 2017-04-14 DIAGNOSIS — I10 Essential (primary) hypertension: Secondary | ICD-10-CM | POA: Diagnosis not present

## 2017-04-17 DIAGNOSIS — I10 Essential (primary) hypertension: Secondary | ICD-10-CM | POA: Diagnosis not present

## 2017-04-17 DIAGNOSIS — F329 Major depressive disorder, single episode, unspecified: Secondary | ICD-10-CM | POA: Diagnosis not present

## 2017-04-17 DIAGNOSIS — R55 Syncope and collapse: Secondary | ICD-10-CM | POA: Diagnosis not present

## 2017-04-17 DIAGNOSIS — R262 Difficulty in walking, not elsewhere classified: Secondary | ICD-10-CM | POA: Diagnosis not present

## 2017-04-17 DIAGNOSIS — H409 Unspecified glaucoma: Secondary | ICD-10-CM | POA: Diagnosis not present

## 2017-04-17 DIAGNOSIS — E785 Hyperlipidemia, unspecified: Secondary | ICD-10-CM | POA: Diagnosis not present

## 2017-04-18 DIAGNOSIS — E782 Mixed hyperlipidemia: Secondary | ICD-10-CM

## 2017-04-18 HISTORY — DX: Mixed hyperlipidemia: E78.2

## 2017-04-18 NOTE — Progress Notes (Signed)
Cassandra Brooks was seen today in the movement disorders clinic for neurologic consultation at the request of Dr. Leonel Ramsay.  Her primary care provider is Moon, Amy A, NP.  The patient is accompanied by her husband and her daughter who supplement the history.   The consultation is for the evaluation of syncope and to r/o MSA as a source.  The records that were made available to me were reviewed.  Patient was admitted to the hospital on March 21, 2017 after having a syncopal episode, but prior to admission reports several near syncopal episodes and complaining of lightheadedness and dizziness.  On the day of admission, the patient stood up, walked a few steps to the bathroom and then felt as if the room was closing in and the next thing she knew she was on the floor.  She does not think she was out for very long, and her husband reports it was 1-2 minutes.  Her husband was with her and notes that there was no shaking or stiffness.  There was no loss of bladder or bowel control.  She woke up and was aware of her surroundings, without confusion.  Records from admission indicate that admitting service did not find her orthostatic, and in fact recommended adding more blood pressure medication on admission because of hypertension.  EEG was completed on March 22, 2017 while in the hospital.  This was normal.  She saw Dr. Leonel Ramsay.  Orthostatics were negative when he saw the patient.  He suggested following up here.  The patient saw cardiology.  Workup was negative there, but they suggested that she could have a loop recorder placed.  They also suggested that her Effexor dosage to be lowered, as it was increased in the preceding 2 weeks prior to admission.  MRI brain was completed and was negative with exception of cerebral small vessel disease.  MRA brain was negative.  MRA neck was negative.  Echocardiogram was completed with moderate left ventricular hypertrophy, grade 1 diastolic dysfunction and  ejection fraction of 60-65%.  Family states that after the event in the hospital she had trouble with balance but that she has gotten slowly better.  She finished PT yesterday.  Specific Symptoms:  Tremor: Yes.   she has this intermittently.  Patient states that they were happening the entire time she was in the hospital.  She will have trouble grabbing objects.  Tremor in both hands.  Can happen with the hands are in the lap or with posture.  Not sure if related to anxiety.  Tremor may be so bad "all over the body" that she cannot get dressed.  Husband states that "she gets nervous" but then denies it is related to anxiety Family hx of similar:  No. Voice: no change Sleep: sleeps well  Vivid Dreams:  No.  Acting out dreams:  Yes.   Wet Pillows: No. Postural symptoms:  Yes.  , getting better  Falls?  No. Bradykinesia symptoms: no bradykinesia noted Loss of smell:  Yes.   Loss of taste:  No. Urinary Incontinence:  No. Difficulty Swallowing:  No. Handwriting, micrographia: No., but very shaky Trouble with ADL's:  No.  Trouble buttoning clothing: No. Depression:  No. Memory changes:  Yes.   (husband states that not as good as in the past; will forget stuff on the stove and will burn stuff; husband does finances but he has for years since patient had post polio syndrome in the 42s; quit driving in [address]353G with post polio  syndrome) Hallucinations:  No.  visual distortions: No. N/V:  No. Lightheaded:  Yes.  , when bends over  Syncope: Yes.   Diplopia:  No. Dyskinesia:  No.    ALLERGIES:  No Known Allergies  CURRENT MEDICATIONS:  Outpatient Encounter Medications as of 04/20/2017  Medication Sig  . amLODipine (NORVASC) 10 MG tablet Take 10 mg by mouth daily.  . Calcium-Magnesium-Vitamin D (CALCIUM 1200+D3 PO) Take 1 tablet by mouth daily.  . dorzolamide-timolol (COSOPT) 22.3-6.8 MG/ML ophthalmic solution Place 2 drops into both eyes daily.   Marland Kitchen gabapentin (NEURONTIN) 100 MG capsule Take  1 capsule (100 mg total) by mouth 2 (two) times daily with breakfast and lunch.  . gabapentin (NEURONTIN) 300 MG capsule Take 1 capsule (300 mg total) by mouth at bedtime.  . hydrochlorothiazide (MICROZIDE) 12.5 MG capsule Take 1 capsule (12.5 mg total) by mouth daily.  . Multiple Vitamin (MULTIVITAMIN) tablet Take 1 tablet by mouth daily.  . nabumetone (RELAFEN) 750 MG tablet Take 750 mg by mouth 2 (two) times daily.  Marland Kitchen omeprazole (PRILOSEC) 20 MG capsule Take 20 mg by mouth daily.  . pravastatin (PRAVACHOL) 20 MG tablet Take 20 mg by mouth daily.  . Travoprost, BAK Free, (TRAVATAN) 0.004 % SOLN ophthalmic solution Place 1 drop into both eyes at bedtime.  Marland Kitchen venlafaxine XR (EFFEXOR-XR) 150 MG 24 hr capsule Take 1 capsule (150 mg total) by mouth daily with breakfast.   No facility-administered encounter medications on file as of 04/20/2017.     PAST MEDICAL HISTORY:   Past Medical History:  Diagnosis Date  . AAA (abdominal aortic aneurysm) ([place])   . Arthritis    "knees" (03/21/2017)  . Borderline diabetes   . Bulging lumbar disc   . Depression   . Fibromyalgia   . GERD (gastroesophageal reflux disease)   . Glaucoma, both eyes   . High cholesterol    "don't have it but I take RX" (03/21/2017)  . Hypertension   . Syncope and collapse     PAST SURGICAL HISTORY:   Past Surgical History:  Procedure Laterality Date  . ABDOMINAL AORTIC ANEURYSM REPAIR  1966  . APPENDECTOMY  1966  . VAGINAL HYSTERECTOMY     "partial"  . VIDEO BRONCHOSCOPY Bilateral 03/19/2015   Procedure: VIDEO BRONCHOSCOPY WITHOUT FLUORO;  Surgeon: Rigoberto Noel, MD;  Location: [facility];  Service: Cardiopulmonary;  Laterality: Bilateral;    SOCIAL HISTORY:   Social History   Socioeconomic History  . Marital status: Married    Spouse name: Not on file  . Number of children: Not on file  . Years of education: Not on file  . Highest education level: Not on file  Social Needs  . Financial resource strain:  Not on file  . Food insecurity - worry: Not on file  . Food insecurity - inability: Not on file  . Transportation needs - medical: Not on file  . Transportation needs - non-medical: Not on file  Occupational History  . Occupation: retired    Comment: Charity fundraiser; dietician  Tobacco Use  . Smoking status: Never Smoker  . Smokeless tobacco: Never Used  Substance and Sexual Activity  . Alcohol use: No  . Drug use: No  . Sexual activity: Not on file  Other Topics Concern  . Not on file  Social History Narrative  . Not on file    FAMILY HISTORY:   Family Status  Relation Name Status  . Father  Deceased  . Mother  Deceased  .  Sister x2 Alive  . Brother 1 Alive  . Child x3 Alive  . Brother 3 Deceased    ROS:  Rare CP when "I get up real fast."  A complete 10 system review of systems was obtained and was unremarkable apart from what is mentioned above.  PHYSICAL EXAMINATION:    VITALS:   Vitals:   04/20/17 0856  SpO2: 93%  Weight: 177 lb (80.3 kg)  Height: 5\' 2"  (1.575 m)   Orthostatic VS for the past 24 hrs:  BP- Lying Pulse- Lying BP- Sitting Pulse- Sitting BP- Standing at 0 minutes Pulse- Standing at 0 minutes  04/20/17 0856 142/70 84 136/78 78 128/74 80      GEN:  The patient appears stated age and is in NAD. HEENT:  Normocephalic, atraumatic.  The mucous membranes are moist. The superficial temporal arteries are without ropiness or tenderness. CV:  RRR Lungs:  CTAB Neck/HEME:  There are no carotid bruits bilaterally.  Neurological examination:  Orientation: The patient is able to correctly name the month/date/year.  She draws a clock correctly and scores a 3/4 on the clock drawing.  She has trouble with copying shapes.   Cranial nerves: There is good facial symmetry. Pupils are equal round and nonreactive to light bilaterally. Fundoscopic exam is attempted but the disc margins are not well visualized bilaterally. Extraocular muscles are intact. The visual fields  are full to confrontational testing. The speech is fluent and clear. Soft palate rises symmetrically and there is no tongue deviation. Hearing is intact to conversational tone. Sensation: Sensation is intact to light and pinprick throughout (facial, trunk, extremities). Vibration is intact at the bilateral big toe. There is no extinction with double simultaneous stimulation. There is no sensory dermatomal level identified. Motor: Strength is 5/5 in the bilateral upper and lower extremities.   Shoulder shrug is equal and symmetric.  There is no pronator drift. Deep tendon reflexes: Deep tendon reflexes are 2/4 at the bilateral biceps, triceps, brachioradialis, patella and achilles. Plantar responses are downgoing bilaterally.  Movement examination: Tone: There is normal tone in the bilateral upper extremities.  The tone in the lower extremities is normal.  Abnormal movements: She has mild tremor of the outstretched hands.  No worse with intention.  No rest tremor.   Coordination:  There is no decremation with RAM's, with any form of RAMS, including alternating supination and pronation of the forearm, hand opening and closing, finger taps, heel taps and toe taps. Gait and Station: The patient has no difficulty arising out of a deep-seated chair without the use of the hands. The patient's stride length is normal.    Lab Results  Component Value Date   VITAMINB12 342 03/22/2017   Lab Results  Component Value Date   TSH 1.677 03/22/2017     Chemistry      Component Value Date/Time   NA 139 03/27/2017 0456   K 3.4 (L) 03/27/2017 0456   CL 103 03/27/2017 0456   CO2 25 03/27/2017 0456   BUN 23 (H) 03/27/2017 0456   CREATININE 0.88 03/27/2017 0456      Component Value Date/Time   CALCIUM 9.0 03/27/2017 0456   ALKPHOS 67 03/26/2017 1211   AST 45 (H) 03/26/2017 1211   ALT 42 03/26/2017 1211   BILITOT 0.4 03/26/2017 1211     Lab Results  Component Value Date   WBC 4.4 03/27/2017   HGB  14.5 03/27/2017   HCT 43.5 03/27/2017   MCV 104.8 (H) 03/27/2017  PLT 199 03/27/2017      ASSESSMENT/PLAN:  1.  Dizziness and syncope  -She was not orthostatic in the office today, nor was she several times in the hospital when it was checked.  She was sent today to rule out multiple system atrophy.  I had a long discussion with the patient and her family today.  I saw no evidence of multiple system atrophy.  I see no evidence of any type of neurologic disease that would cause her symptoms.  She was offered a loop recorder by cardiology to investigate further her symptoms, and I would recommend that she follow-up with them.  A tilt table may also be of value.  Ultimately, the patient and her family state that she is doing much better and just want to take a wait and see approach.  I think that is reasonable.  She does not drive.  2.  Mild b12 deficiency  -would like to see over 400 and would recommend B12 supplementation, 1000 mcg daily.  3.  Memory change  -She likely has mild dementia.  Family has noticed burning things on the stove.  We offered neurocognitive testing.  They have declined for now.  They will let me know if they change their mind.  Safety in the home was discussed.  Her husband does most things within the home.  4.  Follow-up with me will be on an as needed basis.  Greater than 50% of the 45-minute visit was spent in counseling with the patient and her family.  This did not include the 40 min of record review which was detailed above, which was non face to face time.   Cc:  Lowella Dandy, NP

## 2017-04-19 DIAGNOSIS — R55 Syncope and collapse: Secondary | ICD-10-CM | POA: Diagnosis not present

## 2017-04-19 DIAGNOSIS — E785 Hyperlipidemia, unspecified: Secondary | ICD-10-CM | POA: Diagnosis not present

## 2017-04-19 DIAGNOSIS — I10 Essential (primary) hypertension: Secondary | ICD-10-CM | POA: Diagnosis not present

## 2017-04-19 DIAGNOSIS — H409 Unspecified glaucoma: Secondary | ICD-10-CM | POA: Diagnosis not present

## 2017-04-19 DIAGNOSIS — F329 Major depressive disorder, single episode, unspecified: Secondary | ICD-10-CM | POA: Diagnosis not present

## 2017-04-19 DIAGNOSIS — R262 Difficulty in walking, not elsewhere classified: Secondary | ICD-10-CM | POA: Diagnosis not present

## 2017-04-20 ENCOUNTER — Ambulatory Visit (INDEPENDENT_AMBULATORY_CARE_PROVIDER_SITE_OTHER): Payer: Medicare Other | Admitting: Neurology

## 2017-04-20 ENCOUNTER — Ambulatory Visit: Payer: Medicare Other | Admitting: Neurology

## 2017-04-20 ENCOUNTER — Encounter: Payer: Self-pay | Admitting: Neurology

## 2017-04-20 VITALS — Ht 62.0 in | Wt 177.0 lb

## 2017-04-20 DIAGNOSIS — R413 Other amnesia: Secondary | ICD-10-CM | POA: Diagnosis not present

## 2017-04-20 DIAGNOSIS — R55 Syncope and collapse: Secondary | ICD-10-CM | POA: Diagnosis not present

## 2017-04-20 DIAGNOSIS — E538 Deficiency of other specified B group vitamins: Secondary | ICD-10-CM

## 2017-04-20 NOTE — Patient Instructions (Signed)
1. Take oral B12 supplement - 1000 mcg daily. You can get this over the counter.   2. Let us know if you like to have Neurocognitive testing.

## 2017-04-21 DIAGNOSIS — I10 Essential (primary) hypertension: Secondary | ICD-10-CM | POA: Diagnosis not present

## 2017-04-21 DIAGNOSIS — R262 Difficulty in walking, not elsewhere classified: Secondary | ICD-10-CM | POA: Diagnosis not present

## 2017-04-21 DIAGNOSIS — F329 Major depressive disorder, single episode, unspecified: Secondary | ICD-10-CM | POA: Diagnosis not present

## 2017-04-21 DIAGNOSIS — E785 Hyperlipidemia, unspecified: Secondary | ICD-10-CM | POA: Diagnosis not present

## 2017-04-21 DIAGNOSIS — R55 Syncope and collapse: Secondary | ICD-10-CM | POA: Diagnosis not present

## 2017-04-21 DIAGNOSIS — H409 Unspecified glaucoma: Secondary | ICD-10-CM | POA: Diagnosis not present

## 2017-04-25 ENCOUNTER — Ambulatory Visit: Payer: Medicare Other | Admitting: Neurology

## 2017-04-26 DIAGNOSIS — L578 Other skin changes due to chronic exposure to nonionizing radiation: Secondary | ICD-10-CM | POA: Diagnosis not present

## 2017-04-26 DIAGNOSIS — L821 Other seborrheic keratosis: Secondary | ICD-10-CM | POA: Diagnosis not present

## 2017-04-26 DIAGNOSIS — L570 Actinic keratosis: Secondary | ICD-10-CM | POA: Diagnosis not present

## 2017-04-26 DIAGNOSIS — D044 Carcinoma in situ of skin of scalp and neck: Secondary | ICD-10-CM | POA: Diagnosis not present

## 2017-05-01 DIAGNOSIS — R262 Difficulty in walking, not elsewhere classified: Secondary | ICD-10-CM | POA: Diagnosis not present

## 2017-05-01 DIAGNOSIS — Z6832 Body mass index (BMI) 32.0-32.9, adult: Secondary | ICD-10-CM | POA: Diagnosis not present

## 2017-05-01 DIAGNOSIS — R251 Tremor, unspecified: Secondary | ICD-10-CM | POA: Diagnosis not present

## 2017-05-02 ENCOUNTER — Encounter: Payer: Self-pay | Admitting: Psychology

## 2017-05-02 DIAGNOSIS — C4441 Basal cell carcinoma of skin of scalp and neck: Secondary | ICD-10-CM | POA: Diagnosis not present

## 2017-05-15 DIAGNOSIS — C4441 Basal cell carcinoma of skin of scalp and neck: Secondary | ICD-10-CM | POA: Diagnosis not present

## 2017-05-22 DIAGNOSIS — F411 Generalized anxiety disorder: Secondary | ICD-10-CM | POA: Diagnosis not present

## 2017-05-22 DIAGNOSIS — R251 Tremor, unspecified: Secondary | ICD-10-CM | POA: Diagnosis not present

## 2017-05-22 DIAGNOSIS — Z6831 Body mass index (BMI) 31.0-31.9, adult: Secondary | ICD-10-CM | POA: Diagnosis not present

## 2017-05-22 DIAGNOSIS — I10 Essential (primary) hypertension: Secondary | ICD-10-CM | POA: Diagnosis not present

## 2017-06-05 DIAGNOSIS — Z6833 Body mass index (BMI) 33.0-33.9, adult: Secondary | ICD-10-CM | POA: Diagnosis not present

## 2017-06-05 DIAGNOSIS — Z136 Encounter for screening for cardiovascular disorders: Secondary | ICD-10-CM | POA: Diagnosis not present

## 2017-06-05 DIAGNOSIS — E669 Obesity, unspecified: Secondary | ICD-10-CM | POA: Diagnosis not present

## 2017-06-05 DIAGNOSIS — Z1331 Encounter for screening for depression: Secondary | ICD-10-CM | POA: Diagnosis not present

## 2017-06-05 DIAGNOSIS — Z1231 Encounter for screening mammogram for malignant neoplasm of breast: Secondary | ICD-10-CM | POA: Diagnosis not present

## 2017-06-05 DIAGNOSIS — Z9181 History of falling: Secondary | ICD-10-CM | POA: Diagnosis not present

## 2017-06-05 DIAGNOSIS — E785 Hyperlipidemia, unspecified: Secondary | ICD-10-CM | POA: Diagnosis not present

## 2017-06-05 DIAGNOSIS — Z0000 Encounter for general adult medical examination without abnormal findings: Secondary | ICD-10-CM | POA: Diagnosis not present

## 2017-06-05 DIAGNOSIS — N959 Unspecified menopausal and perimenopausal disorder: Secondary | ICD-10-CM | POA: Diagnosis not present

## 2017-07-12 DIAGNOSIS — F329 Major depressive disorder, single episode, unspecified: Secondary | ICD-10-CM | POA: Diagnosis not present

## 2017-07-12 DIAGNOSIS — R42 Dizziness and giddiness: Secondary | ICD-10-CM | POA: Diagnosis not present

## 2017-07-12 DIAGNOSIS — R7301 Impaired fasting glucose: Secondary | ICD-10-CM | POA: Diagnosis not present

## 2017-07-12 DIAGNOSIS — K219 Gastro-esophageal reflux disease without esophagitis: Secondary | ICD-10-CM | POA: Diagnosis not present

## 2017-07-12 DIAGNOSIS — M1990 Unspecified osteoarthritis, unspecified site: Secondary | ICD-10-CM | POA: Diagnosis not present

## 2017-07-12 DIAGNOSIS — E785 Hyperlipidemia, unspecified: Secondary | ICD-10-CM | POA: Diagnosis not present

## 2017-07-12 DIAGNOSIS — I10 Essential (primary) hypertension: Secondary | ICD-10-CM | POA: Diagnosis not present

## 2017-07-19 DIAGNOSIS — H2513 Age-related nuclear cataract, bilateral: Secondary | ICD-10-CM | POA: Diagnosis not present

## 2017-07-19 DIAGNOSIS — H401112 Primary open-angle glaucoma, right eye, moderate stage: Secondary | ICD-10-CM | POA: Diagnosis not present

## 2017-07-19 DIAGNOSIS — H401121 Primary open-angle glaucoma, left eye, mild stage: Secondary | ICD-10-CM | POA: Diagnosis not present

## 2017-08-17 DIAGNOSIS — Z6832 Body mass index (BMI) 32.0-32.9, adult: Secondary | ICD-10-CM | POA: Diagnosis not present

## 2017-08-17 DIAGNOSIS — M5136 Other intervertebral disc degeneration, lumbar region: Secondary | ICD-10-CM | POA: Diagnosis not present

## 2017-08-17 DIAGNOSIS — R42 Dizziness and giddiness: Secondary | ICD-10-CM | POA: Diagnosis not present

## 2017-08-17 DIAGNOSIS — I10 Essential (primary) hypertension: Secondary | ICD-10-CM | POA: Diagnosis not present

## 2017-08-23 ENCOUNTER — Telehealth (INDEPENDENT_AMBULATORY_CARE_PROVIDER_SITE_OTHER): Payer: Self-pay | Admitting: Specialist

## 2017-08-23 NOTE — Telephone Encounter (Signed)
Patient's daughter Lidia Collum) called asked when can her mother get scheduled to see Dr Louanne Skye for her back. [name] said she left the medical records for Dr Louanne Skye to review last Friday. The number to contact [name] is [phone]

## 2017-08-24 NOTE — Telephone Encounter (Signed)
I haven't seen records for her, but put her on for next available new pt appt.  Dr. Louanne Skye may have the records.

## 2017-08-30 ENCOUNTER — Other Ambulatory Visit (INDEPENDENT_AMBULATORY_CARE_PROVIDER_SITE_OTHER): Payer: Self-pay

## 2017-08-30 DIAGNOSIS — M545 Low back pain: Principal | ICD-10-CM

## 2017-08-30 DIAGNOSIS — G8929 Other chronic pain: Secondary | ICD-10-CM

## 2017-09-05 DIAGNOSIS — C4441 Basal cell carcinoma of skin of scalp and neck: Secondary | ICD-10-CM | POA: Diagnosis not present

## 2017-09-05 DIAGNOSIS — B354 Tinea corporis: Secondary | ICD-10-CM | POA: Diagnosis not present

## 2017-09-05 DIAGNOSIS — L570 Actinic keratosis: Secondary | ICD-10-CM | POA: Diagnosis not present

## 2017-09-07 ENCOUNTER — Encounter: Payer: Self-pay | Admitting: Psychology

## 2017-09-07 ENCOUNTER — Ambulatory Visit (INDEPENDENT_AMBULATORY_CARE_PROVIDER_SITE_OTHER): Payer: Medicare Other | Admitting: Psychology

## 2017-09-07 ENCOUNTER — Ambulatory Visit: Payer: Medicare Other | Admitting: Psychology

## 2017-09-07 ENCOUNTER — Encounter

## 2017-09-07 DIAGNOSIS — R413 Other amnesia: Secondary | ICD-10-CM | POA: Diagnosis not present

## 2017-09-07 NOTE — Progress Notes (Signed)
NEUROBEHAVIORAL STATUS EXAM   Name: Cassandra Brooks Date of Birth: 01-18-1941 Date of Interview: 09/07/2017  Reason for Referral:  Cassandra Brooks is a 77 y.o. female who is referred for neuropsychological evaluation by Dr. Wells Guiles Tat of [facility] due to concerns about possible dementia. This patient is accompanied in the office by her husband, daughter and granddaughter who supplement the history.  History of Presenting Problem:  Cassandra Brooks was seen by Dr. Carles Collet for neurologic consultation on 04/20/2017 for syncope and to rule out MSA. The patient had been admitted to the hospital on 03/21/2017 after a syncopal episode. Brain MRI while in the hospital showed no acute intracranial infarct or other abnormality; there was generalized age related cerebral atrophy with moderate chronic small vessel ischemic disease reported. MRA was normal. Dr. Carles Collet saw no evidence of multiple system atrophy or of any type of neurologic disease that would cause her symptoms. Due to memory concerns (e.g., forgetting items on the stove and burning them), Dr. Carles Collet recommended neurocognitive evaluation.   Today (09/07/2017), her family reports gradual onset and progressive worsening of cognitive decline over the past 5-6 years. The patient reports that she is aware she sometimes forgets events that have recently occurred. She will have no memory of something that happened, and cueing does not help. Her family also reported that she repeats questions, gets distracted more easily, has significant word finding difficulty and demonstrates tangential or circumstantial speech. She continues to forget items she has put on the stove. She misplaces/loses items occasionally. She admits her mood has been "rotten" sometimes lately. Her family agrees she is more irritable. She reports she gets "nervous a whole lot but I don't know what I'm thinking about". Her husband thinks she might be depressed because she gets very  aggravated that she can't do things like she used to.  They are also concerned about tremors. They report that her whole body will shake. She will drop items and sometimes can't button her blouse due to the shaking. However, when she plays games on her phone she does not shake at all. Her family reports she was given "nerve medication" to help with the shaking but there has been no improvement and in fact if anything it has worsened over time. She is also falling frequently, several times a week. She reports that she will feel like she is going backwards all of a sudden. Some days she feels like she is about to pass out. She feels significant weakness as well as pain in her legs and feet. Her family is concerned that her "muscle relaxer" may be the issue, that she may be taking too much of this or it is too strong for her and causing her to be off balance and fall. The patient manages this medication (and all her medications) herself. They report that her PCP cut down the amount to 1/4 of what she was taking, but they are not sure how/when she takes it.  Of note, the patient has a history of polio as a child (age 57) and was diagnosed with post-polio syndrome around age 77. She and her family report acute onset of significant functional decline and major depression at that time. It is unclear if her physical symptoms were organic or psychiatric in nature. She had never had depression before that. At that time, she became so depressed she could not work or complete any IADLs. She stayed in bed all of the time. She reports that during that time she  was wishing she was dead but she was not suicidal. She went on disability. She was sending money to [place] and was very upset with her husband when he got a PO box so that she wouldn't get any more financially exploitative mailings. [place] and over a period of years she gradually improved and became relatively functional again. However she never  returned to driving, managing the finances or managing appointments (her husband continues to manage these tasks). She has never had any counseling or psychotherapy. She has been on psychotropic medications over the years since then. She has not had any inpatient psychiatric hospitalizations. She does not know of any significant family mental health history. She has no known family history of dementia.   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 She had an injury on the job (later had surgery for this) and then went on disability after the diagnosis of post-polio syndrome. She never returned to working. Marital history: Married x59 years. They have 3 children, 7 grandchildren, and 4 great grandchildren. Alcohol: None Tobacco: None   Medical History: Past Medical History:  Diagnosis Date  . AAA (abdominal aortic aneurysm) ([place])   . Arthritis    "knees" (03/21/2017)  . Borderline diabetes   . Bulging lumbar disc   . Depression   . Fibromyalgia   . GERD (gastroesophageal reflux disease)   . Glaucoma, both eyes   . High cholesterol    "don't have it but I take RX" (03/21/2017)  . Hypertension   . Syncope and collapse      Current Medications:  Outpatient Encounter Medications as of 09/07/2017  Medication Sig  . amLODipine (NORVASC) 10 MG tablet Take 10 mg by mouth daily.  . Calcium-Magnesium-Vitamin D (CALCIUM 1200+D3 PO) Take 1 tablet by mouth daily.  . dorzolamide-timolol (COSOPT) 22.3-6.8 MG/ML ophthalmic solution Place 2 drops into both eyes daily.   Marland Kitchen gabapentin (NEURONTIN) 100 MG capsule Take 1 capsule (100 mg total) by mouth 2 (two) times daily with breakfast and lunch.  . gabapentin (NEURONTIN) 300 MG capsule Take 1 capsule (300 mg total) by mouth at bedtime.  . hydrochlorothiazide  (MICROZIDE) 12.5 MG capsule Take 1 capsule (12.5 mg total) by mouth daily.  . Multiple Vitamin (MULTIVITAMIN) tablet Take 1 tablet by mouth daily.  . nabumetone (RELAFEN) 750 MG tablet Take 750 mg by mouth 2 (two) times daily.  Marland Kitchen omeprazole (PRILOSEC) 20 MG capsule Take 20 mg by mouth daily.  . pravastatin (PRAVACHOL) 20 MG tablet Take 20 mg by mouth daily.  . Travoprost, BAK Free, (TRAVATAN) 0.004 % SOLN ophthalmic solution Place 1 drop into both eyes at bedtime.  Marland Kitchen venlafaxine XR (EFFEXOR-XR) 150 MG 24 hr capsule Take 1 capsule (150 mg total) by mouth daily with breakfast.   No facility-administered encounter medications on file as of 09/07/2017.      Behavioral Observations:   Appearance: Neatly, casually and appropriately dressed and groomed. Hand-wringing and then skin picking behaviors were observed throughout the session. Gait: Ambulated independently, no gross abnormalities observed Speech: Fluent; normal rate, rhythm and volume. No significant word finding difficulty observed. Thought process: Linear, goal directed Affect: Full, appears euthymic Interpersonal: Pleasant, appropriate   45 minutes spent face-to-face with patient completing neurobehavioral status exam. 40 minutes spent integrating medical records/clinical data and completing this report.  CPT code [phone] unit.   TESTING: There is medical necessity to proceed with neuropsychological assessment as the results will be used to aid in differential diagnosis and clinical decision-making and to inform specific treatment recommendations. Per the patient, her family and medical records reviewed, there has been a change in cognitive functioning and a reasonable suspicion of dementia.  Clinical Decision Making: In considering the patient's current level of functioning, level of presumed impairment, nature of symptoms, emotional and behavioral responses during the interview, level of literacy, and observed level of motivation, a  battery of tests was selected and communicated to the psychometrician.   Following the clinical interview/neurobehavioral status exam, the patient completed this full battery of neuropsychological testing with my psychometrician under my supervision (see separate note).   PLAN: The patient will return to see me for a follow-up session at which time her test performances and my impressions and treatment recommendations will be reviewed in detail.  Evaluation ongoing; full report to follow.

## 2017-09-07 NOTE — Progress Notes (Signed)
   Neuropsychology Note  Cassandra Brooks completed 60 minutes of neuropsychological testing with technician, Milana Kidney, BS, under the supervision of Dr. Macarthur Critchley, Licensed Psychologist. The patient did not appear overtly distressed by the testing session, per behavioral observation or via self-report to the technician. Rest breaks were offered.   Clinical Decision Making: In considering the patient's current level of functioning, level of presumed impairment, nature of symptoms, emotional and behavioral responses during the interview, level of literacy, and observed level of motivation/effort, a battery of tests was selected and communicated to the psychometrician.  Communication between the psychologist and technician was ongoing throughout the testing session and changes were made as deemed necessary based on patient performance on testing, technician observations and additional pertinent factors such as those listed above.  Cassandra Brooks will return within approximately 2 weeks for an interactive feedback session with Dr. Si Raider at which time her test performances, clinical impressions and treatment recommendations will be reviewed in detail. The patient understands she can contact our office should she require our assistance before this time.  35 minutes spent performing neuropsychological evaluation services/clinical decision making (psychologist). [CPT 37169] 60 minutes spent face-to-face with patient administering standardized tests, 30 minutes spent scoring (technician). [CPT [id number], [id number]]  Full report to follow.

## 2017-09-21 ENCOUNTER — Other Ambulatory Visit (INDEPENDENT_AMBULATORY_CARE_PROVIDER_SITE_OTHER): Payer: Self-pay | Admitting: Specialist

## 2017-09-21 ENCOUNTER — Ambulatory Visit (INDEPENDENT_AMBULATORY_CARE_PROVIDER_SITE_OTHER): Payer: Medicare Other | Admitting: Specialist

## 2017-09-21 ENCOUNTER — Ambulatory Visit (INDEPENDENT_AMBULATORY_CARE_PROVIDER_SITE_OTHER): Payer: Medicare Other

## 2017-09-21 ENCOUNTER — Encounter (INDEPENDENT_AMBULATORY_CARE_PROVIDER_SITE_OTHER): Payer: Self-pay | Admitting: Specialist

## 2017-09-21 VITALS — BP 148/75 | HR 80 | Ht 61.0 in | Wt 165.0 lb

## 2017-09-21 DIAGNOSIS — M47816 Spondylosis without myelopathy or radiculopathy, lumbar region: Secondary | ICD-10-CM

## 2017-09-21 DIAGNOSIS — R2689 Other abnormalities of gait and mobility: Secondary | ICD-10-CM

## 2017-09-21 DIAGNOSIS — M48062 Spinal stenosis, lumbar region with neurogenic claudication: Secondary | ICD-10-CM | POA: Diagnosis not present

## 2017-09-21 DIAGNOSIS — G8929 Other chronic pain: Secondary | ICD-10-CM

## 2017-09-21 DIAGNOSIS — M545 Low back pain: Secondary | ICD-10-CM

## 2017-09-21 MED ORDER — BACLOFEN 10 MG PO TABS: 10.0000 mg | ORAL_TABLET | Freq: Three times a day (TID) | ORAL | 0 refills | Status: DC

## 2017-09-21 NOTE — Patient Instructions (Addendum)
Avoid bending, stooping and avoid lifting weights greater than 10 lbs. Avoid prolong standing and walking. Avoid frequent bending and stooping  No lifting greater than 10 lbs. May use ice or moist heat for pain. Weight loss is of benefit. Handicap license is approved. Dr. Romona Curls secretary/Assistant will call to arrange for epidural steroid injection  Start baby aspirin one tablet daily. PT for balance and coordination exercises. EMG/NCV to assess for peripheral neuropathy Neurology to help with assessment of possible peripheral neuropathy but the  Presence of good reflexes is more concerning for a central condition. Yoga and [employer] joining [employer].  Fall Prevention and Home Safety Falls cause injuries and can affect all age groups. It is possible to use preventive measures to significantly decrease the likelihood of falls. There are many simple measures which can make your home safer and prevent falls. OUTDOORS  Repair cracks and edges of walkways and driveways.  Remove high doorway thresholds.  Trim shrubbery on the main path into your home.  Have good outside lighting.  Clear walkways of tools, rocks, debris, and clutter.  Check that handrails are not broken and are securely fastened. Both sides of steps should have handrails.  Have leaves, snow, and ice cleared regularly.  Use sand or salt on walkways during winter months.  In the garage, clean up grease or oil spills. BATHROOM  Install night lights.  Install grab bars by the toilet and in the tub and shower.  Use non-skid mats or decals in the tub or shower.  Place a plastic non-slip stool in the shower to sit on, if needed.  Keep floors dry and clean up all water on the floor immediately.  Remove soap buildup in the tub or shower on a regular basis.  Secure bath mats with non-slip, double-sided rug tape.  Remove throw rugs and tripping hazards from the floors. BEDROOMS  Install night lights.  Make sure a  bedside light is easy to reach.  Do not use oversized bedding.  Keep a telephone by your bedside.  Have a firm chair with side arms to use for getting dressed.  Remove throw rugs and tripping hazards from the floor. KITCHEN  Keep handles on pots and pans turned toward the center of the stove. Use back burners when possible.  Clean up spills quickly and allow time for drying.  Avoid walking on wet floors.  Avoid hot utensils and knives.  Position shelves so they are not too high or low.  Place commonly used objects within easy reach.  If necessary, use a sturdy step stool with a grab bar when reaching.  Keep electrical cables out of the way.  Do not use floor polish or wax that makes floors slippery. If you must use wax, use non-skid floor wax.  Remove throw rugs and tripping hazards from the floor. STAIRWAYS  Never leave objects on stairs.  Place handrails on both sides of stairways and use them. Fix any loose handrails. Make sure handrails on both sides of the stairways are as long as the stairs.  Check carpeting to make sure it is firmly attached along stairs. Make repairs to worn or loose carpet promptly.  Avoid placing throw rugs at the top or bottom of stairways, or properly secure the rug with carpet tape to prevent slippage. Get rid of throw rugs, if possible.  Have an electrician put in a light switch at the top and bottom of the stairs. OTHER FALL PREVENTION TIPS  Wear low-heel or rubber-soled shoes  that are supportive and fit well. Wear closed toe shoes.  When using a stepladder, make sure it is fully opened and both spreaders are firmly locked. Do not climb a closed stepladder.  Add color or contrast paint or tape to grab bars and handrails in your home. Place contrasting color strips on first and last steps.  Learn and use mobility aids as needed. Install an electrical emergency response system.  Turn on lights to avoid dark areas. Replace light bulbs  that burn out immediately. Get light switches that glow.  Arrange furniture to create clear pathways. Keep furniture in the same place.  Firmly attach carpet with non-skid or double-sided tape.  Eliminate uneven floor surfaces.  Select a carpet pattern that does not visually hide the edge of steps.  Be aware of all pets. OTHER HOME SAFETY TIPS  Set the water temperature for 120 F (48.8 C).  Keep emergency numbers on or near the telephone.  Keep smoke detectors on every level of the home and near sleeping areas. Document Released: 03/18/2002 Document Revised: 09/27/2011 Document Reviewed: 06/17/2011 [employer] Patient Information 2014 [place].

## 2017-09-21 NOTE — Telephone Encounter (Signed)
balcofen refill request

## 2017-09-21 NOTE — Progress Notes (Signed)
Office Visit Note   Patient: Cassandra Brooks           Date of Birth: 1941/03/08           MRN: [id number] Visit Date: 09/21/2017              Requested by: Lowella Dandy, NP [place], [place] [id number] PCP: Lowella Dandy, NP   Assessment & Plan: Visit Diagnoses:  1. Chronic low back pain, unspecified back pain laterality, with sciatica presence unspecified   2. Balance disorder   3. Spinal stenosis of lumbar region with neurogenic claudication   4. Spondylosis without myelopathy or radiculopathy, lumbar region     Plan: Avoid bending, stooping and avoid lifting weights greater than 10 lbs. Avoid prolong standing and walking. Avoid frequent bending and stooping  No lifting greater than 10 lbs. May use ice or moist heat for pain. Weight loss is of benefit. Handicap license is approved. Dr. Romona Curls secretary/Assistant will call to arrange for epidural steroid injection  Start baby aspirin one tablet daily. PT for balance and coordination exercises. PT for balance and coordination exercises. EMG/NCV to assess for peripheral neuropathy Neurology to help with assessment of possible peripheral neuropathy but the  Presence of good reflexes is more concerning for a central condition. Yoga and [employer] joining [employer]. Fall Prevention and Home Safety Falls cause injuries and can affect all age groups. It is possible to use preventive measures to significantly decrease the likelihood of falls. There are many simple measures which can make your home safer and prevent falls. OUTDOORS  Repair cracks and edges of walkways and driveways.  Remove high doorway thresholds.  Trim shrubbery on the main path into your home.  Have good outside lighting.  Clear walkways of tools, rocks, debris, and clutter.  Check that handrails are not broken and are securely fastened. Both sides of steps should have handrails.  Have leaves, snow, and ice cleared regularly.  Use sand or  salt on walkways during winter months.  In the garage, clean up grease or oil spills. BATHROOM  Install night lights.  Install grab bars by the toilet and in the tub and shower.  Use non-skid mats or decals in the tub or shower.  Place a plastic non-slip stool in the shower to sit on, if needed.  Keep floors dry and clean up all water on the floor immediately.  Remove soap buildup in the tub or shower on a regular basis.  Secure bath mats with non-slip, double-sided rug tape.  Remove throw rugs and tripping hazards from the floors. BEDROOMS  Install night lights.  Make sure a bedside light is easy to reach.  Do not use oversized bedding.  Keep a telephone by your bedside.  Have a firm chair with side arms to use for getting dressed.  Remove throw rugs and tripping hazards from the floor. KITCHEN  Keep handles on pots and pans turned toward the center of the stove. Use back burners when possible.  Clean up spills quickly and allow time for drying.  Avoid walking on wet floors.  Avoid hot utensils and knives.  Position shelves so they are not too high or low.  Place commonly used objects within easy reach.  If necessary, use a sturdy step stool with a grab bar when reaching.  Keep electrical cables out of the way.  Do not use floor polish or wax that makes floors slippery. If you must use wax,  use non-skid floor wax.  Remove throw rugs and tripping hazards from the floor. STAIRWAYS  Never leave objects on stairs.  Place handrails on both sides of stairways and use them. Fix any loose handrails. Make sure handrails on both sides of the stairways are as long as the stairs.  Check carpeting to make sure it is firmly attached along stairs. Make repairs to worn or loose carpet promptly.  Avoid placing throw rugs at the top or bottom of stairways, or properly secure the rug with carpet tape to prevent slippage. Get rid of throw rugs, if possible.  Have an  electrician put in a light switch at the top and bottom of the stairs. OTHER FALL PREVENTION TIPS  Wear low-heel or rubber-soled shoes that are supportive and fit well. Wear closed toe shoes.  When using a stepladder, make sure it is fully opened and both spreaders are firmly locked. Do not climb a closed stepladder.  Add color or contrast paint or tape to grab bars and handrails in your home. Place contrasting color strips on first and last steps.  Learn and use mobility aids as needed. Install an electrical emergency response system.  Turn on lights to avoid dark areas. Replace light bulbs that burn out immediately. Get light switches that glow.  Arrange furniture to create clear pathways. Keep furniture in the same place.  Firmly attach carpet with non-skid or double-sided tape.  Eliminate uneven floor surfaces.  Select a carpet pattern that does not visually hide the edge of steps.  Be aware of all pets. OTHER HOME SAFETY TIPS  Set the water temperature for 120 F (48.8 C).  Keep emergency numbers on or near the telephone.  Keep smoke detectors on every level of the home and near sleeping areas. Document Released: 03/18/2002 Document Revised: 09/27/2011 Document Reviewed: 06/17/2011 [facility] Patient Information 2014 [place].  Follow-Up Instructions: Return in about 1 month (around 10/19/2017).   Orders:  Orders Placed This Encounter  Procedures  . XR Lumbar Spine 2-3 Views   No orders of the defined types were placed in this encounter.     Procedures: No procedures performed   Clinical Data: No additional findings.   Subjective: Chief Complaint  Patient presents with  . Lower Back - Pain    77 year old female with 2 year history of low back pain with radiation into the legs . The pain is intermittant, if I stand up it is there, when I sit its in the buttocks. My legs are so weak you can hardly stand up. She is not usually grocery shopping due to  legs feeling weak. There is numbness sometimes into the left toes all of them. No bowel or Bladder  Incontinence, does have IBS. There is night pain sometimes, not now though. Sometimes the legs are painful, at least 2-3 times per week she will get up in the  Night. She reports she took tylenol and 1/2 of a nerve pill, alprazolam 0.25 nerve. Takes a muscle relaxer intermittant. Can not walk a mile, leans on things, feels better leaning on the grocery cart and using a cane. House to the garden she get to with a golf cart, able to walk 100 feet to the mail box and back. If she goes to the garden 300 feet and back she has troblue the next couple of days.Pain is sometime sharp, sometimes achy, takes tylenol for pain. The laundry room is in the basement. And she has fallen. She has had problems  with falling out in the parking lot of [employer] one month ago 08/08/2017.      Review of Systems  Constitutional: Negative for activity change, appetite change, chills, diaphoresis, fatigue, fever and unexpected weight change.  HENT: Positive for hearing loss. Negative for congestion, dental problem, drooling, ear discharge, ear pain, facial swelling, mouth sores, nosebleeds, postnasal drip, rhinorrhea, sinus pressure, sinus pain, sneezing, sore throat, tinnitus, trouble swallowing and voice change.   Eyes: Positive for redness and visual disturbance. Negative for photophobia, pain, discharge and itching.  Respiratory: Negative.  Negative for apnea, cough, choking, chest tightness, shortness of breath, wheezing and stridor.   Cardiovascular: Positive for palpitations and leg swelling. Negative for chest pain.  Gastrointestinal: Negative.  Negative for abdominal distention, abdominal pain, anal bleeding, blood in stool, constipation and diarrhea.  Endocrine: Negative.  Negative for cold intolerance, heat intolerance, polydipsia, polyphagia and polyuria.  Genitourinary: Negative.  Negative for difficulty urinating,  dyspareunia, dysuria, enuresis and frequency.  Musculoskeletal: Positive for back pain, gait problem, neck pain and neck stiffness. Negative for arthralgias and joint swelling.  Skin: Negative.  Negative for color change, pallor, rash and wound.  Allergic/Immunologic: Negative.  Negative for environmental allergies, food allergies and immunocompromised state.  Neurological: Positive for dizziness, tremors, syncope, facial asymmetry, light-headedness, numbness and headaches. Negative for seizures, speech difficulty and weakness.  Hematological: Negative.  Negative for adenopathy. Does not bruise/bleed easily.  Psychiatric/Behavioral: Positive for sleep disturbance. Negative for agitation, behavioral problems, confusion, decreased concentration, dysphoric mood, hallucinations, self-injury and suicidal ideas. The patient is not nervous/anxious and is not hyperactive.      Objective: Vital Signs: BP (!) 148/75 (BP Location: Left Arm, Patient Position: Sitting)   Pulse 80   Ht 5\' 1"  (1.549 m)   Wt 165 lb (74.8 kg)   BMI 31.18 kg/m   Physical Exam  Constitutional: She is oriented to person, place, and time. She appears well-developed and well-nourished.  HENT:  Head: Normocephalic and atraumatic.  Eyes: Pupils are equal, round, and reactive to light. EOM are normal.  Neck: Normal range of motion. Neck supple.  Pulmonary/Chest: Effort normal and breath sounds normal.  Abdominal: Soft. Bowel sounds are normal.  Neurological: She is alert and oriented to person, place, and time.  Skin: Skin is warm and dry.  Psychiatric: She has a normal mood and affect. Her behavior is normal. Judgment and thought content normal.    Back Exam   Tenderness  The patient is experiencing tenderness in the lumbar.  Range of Motion  Extension: abnormal  Flexion: normal  Lateral bend right: normal  Lateral bend left: normal  Rotation right: normal  Rotation left: normal   Muscle Strength  Right  Quadriceps:  5/5  Left Quadriceps:  5/5  Right Hamstrings:  5/5  Left Hamstrings:  5/5   Tests  Straight leg raise right: negative Straight leg raise left: negative  Reflexes  Patellar: normal Achilles: normal Biceps: normal Babinski's sign: normal   Other  Toe walk: normal Heel walk: normal Sensation: normal Gait: normal  Erythema: no back redness Scars: absent      Specialty Comments:  No specialty comments available.  Imaging: Xr Lumbar Spine 2-3 Views  Result Date: 09/21/2017 AP and lateral flexion and extension radiographs show DDD L3-4, L4-5 and L5-S1 with retrolisthesis L4-5 3 mm. Spondylosis changes L3-S1.     PMFS History: Patient Active Problem List   Diagnosis Date Noted  . Disequilibrium   . Syncope 03/21/2017  . Dizziness   .  Fall   . Unilateral primary osteoarthritis, left knee 02/26/2016  . Hemoptysis 03/18/2015  . Solitary pulmonary nodule 03/18/2015   Past Medical History:  Diagnosis Date  . AAA (abdominal aortic aneurysm) ([place])   . Arthritis    "knees" (03/21/2017)  . Borderline diabetes   . Bulging lumbar disc   . Depression   . Fibromyalgia   . GERD (gastroesophageal reflux disease)   . Glaucoma, both eyes   . High cholesterol    "don't have it but I take RX" (03/21/2017)  . Hypertension   . Syncope and collapse     Family History  Problem Relation Age of Onset  . Hypertension Father   . Stomach cancer Father   . Glaucoma Mother   . Diabetes Mother   . Diabetes Sister   . Diabetes Brother   . Lupus Child   . Diabetes Brother   . Lung cancer Brother   . Colon cancer Brother     Past Surgical History:  Procedure Laterality Date  . ABDOMINAL AORTIC ANEURYSM REPAIR  1966  . APPENDECTOMY  1966  . VAGINAL HYSTERECTOMY     "partial"  . VIDEO BRONCHOSCOPY Bilateral 03/19/2015   Procedure: VIDEO BRONCHOSCOPY WITHOUT FLUORO;  Surgeon: Rigoberto Noel, MD;  Location: [facility];  Service: Cardiopulmonary;  Laterality:  Bilateral;   Social History   Occupational History  . Occupation: retired    Comment: Charity fundraiser; dietician  Tobacco Use  . Smoking status: Never Smoker  . Smokeless tobacco: Never Used  Substance and Sexual Activity  . Alcohol use: No  . Drug use: No  . Sexual activity: Not on file

## 2017-09-28 DIAGNOSIS — C4441 Basal cell carcinoma of skin of scalp and neck: Secondary | ICD-10-CM | POA: Diagnosis not present

## 2017-10-02 NOTE — Progress Notes (Signed)
NEUROPSYCHOLOGICAL EVALUATION   Name:    Cassandra Brooks  Date of Birth:   06-07-1940 Date of Interview:  09/07/2017 Date of Testing:  09/07/2017   Date of Feedback:  10/05/2017       Background Information:  Reason for Referral:  Cassandra Brooks is a 77 y.o. female referred by Dr. Wells Guiles Tat to assess her current level of cognitive functioning and assist in differential diagnosis. The current evaluation consisted of a review of available medical records, an interview with the patient and her husband, daughter, and granddaughter, and the completion of a neuropsychological testing battery. Informed consent was obtained.  History of Presenting Problem:  Cassandra Brooks was seen by Dr. Carles Collet for neurologic consultation on 04/20/2017 for syncope and to rule out MSA. The patient had been admitted to the hospital on 03/21/2017 after a syncopal episode. Brain MRI while in the hospital showed no acute intracranial infarct or other abnormality; there was generalized age related cerebral atrophy with moderate chronic small vessel ischemic disease reported. MRA was normal. Dr. Carles Collet saw no evidence of multiple system atrophy or of any type of neurologic disease that would cause her symptoms. Due to memory concerns (e.g., forgetting items on the stove and burning them), Dr. Carles Collet recommended neurocognitive evaluation.   Today (09/07/2017), her family reports gradual onset and progressive worsening of cognitive decline over the past 5-6 years. The patient reports that she is aware she sometimes forgets events that have recently occurred. She will have no memory of something that happened, and cueing does not help. Her family also reported that she repeats questions, gets distracted more easily, has significant word finding difficulty and demonstrates tangential or circumstantial speech. She continues to forget items she has put on the stove. She misplaces/loses items occasionally. She admits her mood has been  "rotten" sometimes lately. Her family agrees she is more irritable. She reports she gets "nervous a whole lot but I don't know what I'm thinking about". Her husband thinks she might be depressed because she gets very aggravated that she can't do things like she used to.  They are also concerned about tremors. They report that her whole body will shake. She will drop items and sometimes can't button her blouse due to the shaking. However, when she plays games on her phone she does not shake at all. Her family reports she was given "nerve medication" to help with the shaking but there has been no improvement and in fact if anything it has worsened over time. She is also falling frequently, several times a week. She reports that she will feel like she is going backwards all of a sudden. Some days she feels like she is about to pass out. She feels significant weakness as well as pain in her legs and feet. Her family is concerned that her "muscle relaxer" may be the issue, that she may be taking too much of this or it is too strong for her and causing her to be off balance and fall. The patient manages this medication (and all her medications) herself. They report that her PCP cut down the amount to 1/4 of what she was taking, but they are not sure how/when she takes it.  Of note, the patient has a history of polio as a child (age 71) and was diagnosed with post-polio syndrome around age 62. She and her family report acute onset of significant functional decline and major depression at that time. It is unclear if her physical symptoms were  organic or psychiatric in nature. She had never had depression before that. At that time, she became so depressed she could not work or complete any IADLs. She stayed in bed all of the time. She reports that during that time she was wishing she was dead but she was not suicidal. She went on disability. She was sending money to [place] and was very upset with her husband  when he got a PO box so that she wouldn't get any more financially exploitative mailings. [place] and over a period of years she gradually improved and became relatively functional again. However she never returned to driving, managing the finances or managing appointments (her husband continues to manage these tasks). She has never had any counseling or psychotherapy. She has been on psychotropic medications over the years since then. She has not had any inpatient psychiatric hospitalizations. She does not know of any significant family mental health history. She has no known family history of dementia.   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 She had an injury on the job (later had surgery for this) and then went on disability after the diagnosis of post-polio syndrome. She never returned to working. Marital history: Married x59 years. They have 3 children, 7 grandchildren, and 4 great grandchildren. Alcohol: None Tobacco: None  Medical History:  Past Medical History:  Diagnosis Date  . AAA (abdominal aortic aneurysm) ([place])   . Arthritis    "knees" (03/21/2017)  . Borderline diabetes   . Bulging lumbar disc   . Depression   . Fibromyalgia   . GERD (gastroesophageal reflux disease)   . Glaucoma, both eyes   . High cholesterol    "don't have it but I take RX" (03/21/2017)  . Hypertension   . Syncope and collapse     Current medications:  Outpatient Encounter Medications as of 10/05/2017  Medication Sig  . ALPRAZolam (XANAX) 0.25 MG tablet TAKE 1 TABLET PO EVERY 6 HOURS PRN FOR NERVES  . amLODipine (NORVASC) 10 MG tablet Take 10 mg by mouth daily.  . baclofen (LIORESAL) 10 MG tablet TAKE 1 TABLET(10 MG) BY MOUTH THREE TIMES DAILY AS NEEDED FOR SPASM  .  Calcium-Magnesium-Vitamin D (CALCIUM 1200+D3 PO) Take 1 tablet by mouth daily.  . dorzolamide-timolol (COSOPT) 22.3-6.8 MG/ML ophthalmic solution Place 2 drops into both eyes daily.   Marland Kitchen gabapentin (NEURONTIN) 100 MG capsule Take 1 capsule (100 mg total) by mouth 2 (two) times daily with breakfast and lunch.  . gabapentin (NEURONTIN) 300 MG capsule Take 1 capsule (300 mg total) by mouth at bedtime.  . hydrochlorothiazide (HYDRODIURIL) 12.5 MG tablet TK 1 T PO D  . Multiple Vitamin (MULTIVITAMIN) tablet Take 1 tablet by mouth daily.  . nabumetone (RELAFEN) 750 MG tablet Take 750 mg by mouth 2 (two) times daily.  Marland Kitchen omeprazole (PRILOSEC) 20 MG capsule Take 20 mg by mouth daily.  . pravastatin (PRAVACHOL) 20 MG tablet Take 20 mg by mouth daily.  . Travoprost, BAK Free, (TRAVATAN) 0.004 % SOLN ophthalmic solution Place 1 drop into both eyes at bedtime.  Marland Kitchen venlafaxine XR (EFFEXOR-XR) 150 MG 24 hr capsule Take 1 capsule (150 mg total) by mouth daily with breakfast.   No facility-administered encounter medications on file as of 10/05/2017.      Current Examination:  Behavioral  Observations:  Appearance: Neatly, casually and appropriately dressed and groomed. Hand-wringing and then skin picking behaviors were observed throughout the session. Gait: Ambulated independently, no gross abnormalities observed Speech: Fluent; normal rate, rhythm and volume. No significant word finding difficulty observed. Thought process: Linear, goal directed Affect: Full, appears euthymic Interpersonal: Pleasant, appropriate Orientation: Oriented to all spheres but could not name the current President nor his predecessor.    Tests Administered: . Test of Premorbid Functioning (TOPF) . Wechsler Adult Intelligence Scale-Fourth Edition (WAIS-IV): Similarities, Music therapist, Coding and Digit Span subtests . Wechsler Memory Scale-Fourth Edition (WMS-IV) Older Adult Version (ages 71-90): Logical Memory I, II and Recognition  subtests  . [employer] Verbal Learning Test - 2nd Edition (CVLT-2) Short Form . Repeatable Battery for the Assessment of Neuropsychological Status (RBANS) Form A:  Figure Copy and Recall subtests and Semantic Fluency subtest . Boston Naming Test (BNT) . Boston Diagnostic Aphasia Examination: Complex Ideational Material subtest . Controlled Oral Word Association Test (COWAT) . Trail Making Test A and B . Clock drawing test . Beck Depression Inventory - 2nd Edition (BDI-II) . Generalized Anxiety Disorder - 7 item screener (GAD-7)  Test Results: Note: Standardized scores are presented only for use by appropriately trained professionals and to allow for any future test-retest comparison. These scores should not be interpreted without consideration of all the information that is contained in the rest of the report. The most recent standardization samples from the test publisher or other sources were used whenever possible to derive standard scores; scores were corrected for age, gender, ethnicity and education when available.   Test Scores:  Test Name Raw Score Standardized Score Descriptor  TOPF 17/70 SS= 79 Borderline  WAIS-IV Subtests     Similarities 13/36 ss= 6 Low average  Block Design 28/66 ss= 10 Average  Coding 35/135 ss= 8 Low end of average  Digit Span Forward 8/16 ss= 8 Low end of average  Digit Span Backward 6/16 ss= 8 Low end of average  WMS-IV Subtests     LM I 28/53 ss= 9 Average  LM II 23/39 ss= 12 High average  LM II Recognition 21/23 Cum %: >75   RBANS Subtests     Figure Copy 13/20 Z= -2.7 Severely impaired  Figure Recall 12/20 Z= -0.1 Average  Semantic Fluency 15/40 Z= -0.9 Low average  CVLT-II Scores     Trial 1 5/9 Z= -0.5 Average  Trial 4 7/9 Z= -0.5 Average  Trials 1-4 total 24/36 T= 45 Average  SD Free Recall 8/9 Z= 0.5 Average  LD Free Recall 8/9 Z= 1 High average  LD Cued Recall 8/9 Z= 0.5 Average  Recognition Discriminability 9/9 hits, 2 false positives  Z= 0 Average  Forced Choice Recognition 9/9  WNL  BNT 38/60 T= 32 Borderline  BDAE Subtest     Complex Ideational Material 10/12    COWAT-FAS 23 T= 40 Low average  COWAT-Animals 13 T= 42 Low average  Trail Making Test A  55" 1 error T= 46 Average  Trail Making Test B  168" 2 errors T= 40 Low average  Clock Drawing   Mildly impaired  BDI-II 16/63  Mild  GAD-7 7/21  Mild      Description of Test Results:  Premorbid verbal intellectual abilities were estimated to have been within the borderline range based on a test of word reading. Psychomotor processing speed was average. Auditory attention and working memory were average. Visual-spatial construction was variable. Specifically, her ability to manipulate three dimensional blocks  to match two dimensional models was average, while her drawn copy of a complex geometric figure was severely impaired. Regarding the latter, she demonstrated appreciation of the gestalt but omitted some details and had difficulty with precision due to tremor. Language abilities were variable. Specifically, confrontation naming was borderline impaired, and semantic verbal fluency was low average. Auditory comprehension of complex ideational material was within normal limits. With regard to verbal memory, encoding and acquisition of non-contextual information (i.e., word list) was average. After a brief distracter task, free recall was average (8/9 items). After a delay, free recall was high average (8/9 items) with 100% retention of previously encoded information. Performance on a yes/no recognition task was average. On another verbal memory test, encoding and acquisition of contextual auditory information (i.e., short stories) was average. After a delay, free recall was high average. Performance on a yes/no recognition task was above average. With regard to non-verbal memory, delayed free recall of visual information was average. Executive functioning was somewhat  variable. Mental flexibility and set-shifting were low average on Trails B. Verbal fluency with phonemic search restrictions was low average. Verbal abstract reasoning was low average. Performance on a clock drawing task was mildly impaired (had to self correct several times). On a self-report measure of mood, the patient's responses were indicative of clinically significant depression at the present time. Symptoms endorsed included: anhedonia, pessimism, self-criticalness, restlessness, loss of interest, indecisiveness, feelings of worthlessness, loss of energy, reduced sleep, irritability, concentration difficulty, fatigue and loss of libido. She denied suicidal ideation or intention. On a self-report measure of anxiety, the patient endorsed mild generalized anxiety characterized by excessive worries, nervousness, inability to control worrying, restlessness, irritability and fear of something awful happening.    Clinical Impressions: Non-amnestic mild cognitive impairment; rule out cognitive disorder due to polypharmacy. Cognitive test results revealed mild impairment in confrontation naming (although semantic fluency was intact). Visual-spatial skills were variable (block construction was intact while drawing was impaired). Otherwise test results were entirely within normal limits and commensurate with estimated premorbid functioning. Memory was an area of relative strength.  These test results do not suggest underlying dementia or Alzheimer's disease. Given the few performances below expectation, a diagnosis of MCI is appropriate at this time. I am concerned that polypharmacy may be playing a role in cognitive symptoms her family has observed. She is managing her medications independently and it is not clear how or when she is taking these, particularly when it comes to PRN meds which include alprazolam and baclofen. There is also a question of how often and how much gabapentin she is taking. I am most  concerned about Xanax (which is prescribed as 0.25 mg once every 6 hrs PRN) as this of course can affect cognitive function and balance. However if other medications are not being taken as prescribed this could also contribute to cognitive impairment. At this time, I think it is most appropriate to address polypharmacy issues and possibly retest in the future. Finally, the patient does present with some depression and anxiety which could be exacerbating cognitive symptoms.   Recommendations/Plan: Based on the findings of the present evaluation, the following recommendations are offered:  1. I would recommend reducing Xanax under the direction and care of her prescribing physician. Psychiatry consultation may be indicated.  2. Regardless of whether or not any medication changes are made, I advised that a family member should be assisting with management of medications and they need to record exactly when and how much she takes of  PRN meds including Xanax. The patient and her husband both seem very confused about medications, and I think a home health consult for medication management would be very helpful so that they can at least get a system in place. They seemed confused when I tried to explain how they could keep track on paper of when and how much medication she is taking. 3. Once above recommendations are in place, and polypharmacy has been ruled out as cause of cognitive changes, neuropsychological re-evaluation could be considered (if there is ongoing or or increased cognitive dysfunction reported in the future). 4. The patient's family stated that at a recent visit with Dr. Louanne Skye at [facility], they were told that she needs further neurology consultation because her balance issues are coming from a "problem with her cerebellum" (per family). They stated that Dr. Louanne Skye was going to communicate with Dr. Carles Collet about this directly. They wondered if they needed to see Dr. Carles Collet for follow-up; she  can advise on this issue.   Feedback to Patient: Cassandra Brooks and her family returned for a feedback appointment on 10/05/2017 to review the results of her neuropsychological evaluation with this provider. 30 minutes face-to-face time was spent reviewing her test results, my impressions and my recommendations as detailed above.    Total time spent on this patient's case: 85 minutes for neurobehavioral status exam with psychologist (CPT code [phone]); 90 minutes of testing/scoring by psychometrician under psychologist's supervision (CPT codes [phone], [phone] units); 180 minutes for integration of patient data, interpretation of standardized test results and clinical data, clinical decision making, treatment planning and preparation of this report, and interactive feedback with review of results to the patient/family by psychologist (CPT codes [phone], [phone] units).      Thank you for your referral of Cassandra Brooks. Please feel free to contact me if you have any questions or concerns regarding this report.

## 2017-10-05 ENCOUNTER — Encounter

## 2017-10-05 ENCOUNTER — Encounter: Payer: Self-pay | Admitting: Psychology

## 2017-10-05 ENCOUNTER — Ambulatory Visit (INDEPENDENT_AMBULATORY_CARE_PROVIDER_SITE_OTHER): Payer: Medicare Other | Admitting: Psychology

## 2017-10-05 DIAGNOSIS — G3184 Mild cognitive impairment, so stated: Secondary | ICD-10-CM

## 2017-10-05 NOTE — Progress Notes (Signed)
I didn't get a note from him but I did review his notes.  I did address this at her visit though.  I did not see any evidence of a neurodegenerative issue causing her balance problem (she was sent to me to rule out multiple system atrophy)

## 2017-10-05 NOTE — Patient Instructions (Signed)
Fortunately, there was NO evidence of Alzheimer's disease or dementia from your evaluation.   I suspect cognitive changes are due to use of Xanax.   I highly recommend discussing how to reduce dose of this over time with your prescribing physician. Psychiatry consultation may be helpful.   Your family should manage your medications for you to ensure that everything is being taken correctly.

## 2017-10-09 ENCOUNTER — Telehealth: Payer: Self-pay | Admitting: Neurology

## 2017-10-09 NOTE — Telephone Encounter (Signed)
Patient's husband made aware.  

## 2017-10-09 NOTE — Telephone Encounter (Signed)
-----   Message from [place], DO sent at 10/09/2017  8:38 AM EDT ----- Cassandra Brooks - please copy the previous into phone encounter.  I didn't see evidence of MSA or primary cerebellar dysfunction.  She doesn't need to return for follow up unless something new neurologically arises ----- Message ----- From: Cassandra Nab, PsyD Sent: 10/08/2017   2:44 PM To: Cassandra Quail Tat, DO  If one of the MAs could call them that would be great.   ----- Message ----- From: Cassandra Clarks, DO Sent: 10/06/2017   7:41 AM To: Cassandra Nab, PsyD  Do you want to let them know or does one of the MA's need to call them? ----- Message ----- From: Cassandra Nab, PsyD Sent: 10/05/2017   4:15 PM To: Cassandra Quail Tat, DO  I told them you likely would not need to see them for follow-up since you ruled out MSA and didn't see evidence of neurologic disorder causing her symptoms. They kept insisting that Cassandra Brooks said something about her cerebellum, and wanted to know where they go from here, so I said I would ask you but I doubted that further neuro eval was indicated.   ----- Message ----- From: Cassandra Clarks, DO Sent: 10/05/2017   4:08 PM To: Cassandra Nab, PsyD    ----- Message ----- From: Cassandra Nab, PsyD Sent: 10/05/2017   3:00 PM To: Cassandra Quail Tat, DO  Cassandra Brooks, the patient's family wants to know if she will be following up with you given recent input from Cassandra Brooks. They stated Cassandra Brooks was going to message you directly about her case?

## 2017-10-13 ENCOUNTER — Ambulatory Visit (INDEPENDENT_AMBULATORY_CARE_PROVIDER_SITE_OTHER)
Admission: RE | Admit: 2017-10-13 | Discharge: 2017-10-13 | Disposition: A | Discharge status: Home / Self Care | Payer: Medicare Other | Source: Ambulatory Visit | Attending: Pulmonary Disease | Admitting: Pulmonary Disease

## 2017-10-13 DIAGNOSIS — R918 Other nonspecific abnormal finding of lung field: Secondary | ICD-10-CM | POA: Diagnosis not present

## 2017-10-13 DIAGNOSIS — R911 Solitary pulmonary nodule: Secondary | ICD-10-CM

## 2017-10-23 ENCOUNTER — Ambulatory Visit (INDEPENDENT_AMBULATORY_CARE_PROVIDER_SITE_OTHER): Payer: Self-pay

## 2017-10-23 ENCOUNTER — Ambulatory Visit (INDEPENDENT_AMBULATORY_CARE_PROVIDER_SITE_OTHER): Payer: Medicare Other | Admitting: Physical Medicine and Rehabilitation

## 2017-10-23 ENCOUNTER — Encounter (INDEPENDENT_AMBULATORY_CARE_PROVIDER_SITE_OTHER): Payer: Self-pay | Admitting: Physical Medicine and Rehabilitation

## 2017-10-23 VITALS — BP 144/71 | HR 67

## 2017-10-23 DIAGNOSIS — M5416 Radiculopathy, lumbar region: Secondary | ICD-10-CM

## 2017-10-23 MED ORDER — METHYLPREDNISOLONE ACETATE 80 MG/ML IJ SUSP
80.0000 mg | Freq: Once | INTRAMUSCULAR | Status: AC
  Administered 2017-10-23: 80 mg

## 2017-10-23 NOTE — Progress Notes (Signed)
Numeric Pain Rating Scale and Functional Assessment Average Pain 9   In the last MONTH (on 0-10 scale) has pain interfered with the following?  1. General activity like being  able to carry out your everyday physical activities such as walking, climbing stairs, carrying groceries, or moving a chair?  Rating(6)    +Driver, -BT, -Dye Allergies. 

## 2017-10-23 NOTE — Patient Instructions (Signed)

## 2017-11-02 NOTE — Procedures (Signed)
Lumbar Epidural Steroid Injection - Interlaminar Approach with Fluoroscopic Guidance  Patient: Cassandra Brooks      Date of Birth: [birth year] MRN: [id number] PCP: Lowella Dandy, NP      Visit Date: 10/23/2017   Universal Protocol:     Consent Given By: the patient  Position: PRONE  Additional Comments: Vital signs were monitored before and after the procedure. Patient was prepped and draped in the usual sterile fashion. The correct patient, procedure, and site was verified.   Injection Procedure Details:  Procedure Site One Meds Administered:  Meds ordered this encounter  Medications  . methylPREDNISolone acetate (DEPO-MEDROL) injection 80 mg     Laterality: Left  Location/Site:  L4-L5  Needle size: 20 G  Needle type: Tuohy  Needle Placement: Paramedian epidural  Findings:   -Comments: Excellent flow of contrast into the epidural space.  Procedure Details: Using a paramedian approach from the side mentioned above, the region overlying the inferior lamina was localized under fluoroscopic visualization and the soft tissues overlying this structure were infiltrated with 4 ml. of 1% Lidocaine without Epinephrine. The Tuohy needle was inserted into the epidural space using a paramedian approach.   The epidural space was localized using loss of resistance along with lateral and bi-planar fluoroscopic views.  After negative aspirate for air, blood, and CSF, a 2 ml. volume of Isovue-250 was injected into the epidural space and the flow of contrast was observed. Radiographs were obtained for documentation purposes.    The injectate was administered into the level noted above.   Additional Comments:  The patient tolerated the procedure well Dressing: Band-Aid    Post-procedure details: Patient was observed during the procedure. Post-procedure instructions were reviewed.  Patient left the clinic in stable condition.

## 2017-11-02 NOTE — Progress Notes (Signed)
Cassandra Brooks - 77 y.o. female MRN [id number]  Date of birth: 1941-04-02  Office Visit Note: Visit Date: 10/23/2017 PCP: Lowella Dandy, NP Referred by: Lowella Dandy, NP  Subjective: Chief Complaint  Patient presents with  . Lower Back - Pain  . Left Leg - Pain   HPI: Cassandra Brooks is a 77 year old female with 3-year history of chronic worsening left hip and leg pain.  She has been seen and evaluated by Dr. Basil Dess who request left L4-5 interlaminar epidural steroid injection.  The injection  will be diagnostic and hopefully therapeutic. The patient has failed conservative care including time, medications and activity modification.   ROS Otherwise per HPI.  Assessment & Plan: Visit Diagnoses:  1. Lumbar radiculopathy     Plan: No additional findings.   Meds & Orders:  Meds ordered this encounter  Medications  . methylPREDNISolone acetate (DEPO-MEDROL) injection 80 mg    Orders Placed This Encounter  Procedures  . XR C-ARM NO REPORT  . Epidural Steroid injection    Follow-up: Return if symptoms worsen or fail to improve, for Dr. Basil Dess.   Procedures: No procedures performed  Lumbar Epidural Steroid Injection - Interlaminar Approach with Fluoroscopic Guidance  Patient: Cassandra Brooks      Date of Birth: 07-06-1940 MRN: [id number] PCP: Lowella Dandy, NP      Visit Date: 10/23/2017   Universal Protocol:     Consent Given By: the patient  Position: PRONE  Additional Comments: Vital signs were monitored before and after the procedure. Patient was prepped and draped in the usual sterile fashion. The correct patient, procedure, and site was verified.   Injection Procedure Details:  Procedure Site One Meds Administered:  Meds ordered this encounter  Medications  . methylPREDNISolone acetate (DEPO-MEDROL) injection 80 mg     Laterality: Left  Location/Site:  L4-L5  Needle size: 20 G  Needle type: Tuohy  Needle Placement: Paramedian  epidural  Findings:   -Comments: Excellent flow of contrast into the epidural space.  Procedure Details: Using a paramedian approach from the side mentioned above, the region overlying the inferior lamina was localized under fluoroscopic visualization and the soft tissues overlying this structure were infiltrated with 4 ml. of 1% Lidocaine without Epinephrine. The Tuohy needle was inserted into the epidural space using a paramedian approach.   The epidural space was localized using loss of resistance along with lateral and bi-planar fluoroscopic views.  After negative aspirate for air, blood, and CSF, a 2 ml. volume of Isovue-250 was injected into the epidural space and the flow of contrast was observed. Radiographs were obtained for documentation purposes.    The injectate was administered into the level noted above.   Additional Comments:  The patient tolerated the procedure well Dressing: Band-Aid    Post-procedure details: Patient was observed during the procedure. Post-procedure instructions were reviewed.  Patient left the clinic in stable condition.   Clinical History: MRI LUMBAR SPINE WITHOUT CONTRAST  TECHNIQUE: Multiplanar, multisequence MR imaging of the lumbar spine was performed. No intravenous contrast was administered.  COMPARISON: Lumbar radiographs 12/06/2016. CT Abdomen and Pelvis 08/26/2013.  FINDINGS: Segmentation: Normal, as demonstrated on the comparisons.  Alignment: Chronic straightening of lumbar lordosis has not significantly changed since 2015. No significant spondylolisthesis.  Vertebrae: Chronic degenerative endplate marrow signal changes at L4-L5 and L5-S1. No marrow edema or evidence of acute osseous abnormality. Visualized bone marrow signal is within normal limits. Intact visible sacrum and SI joints.  Conus medullaris:  Extends to the L1-L2 level and appears normal.  Paraspinal and other soft tissues: Negative.  Disc  levels:  T11-T12: Negative.  T12-L1: Negative.  L1-L2: Minor disc desiccation and facet hypertrophy. No stenosis.  L2-L3: Minor disc desiccation and circumferential disc bulge. Mild facet and ligament flavum hypertrophy. No stenosis.  L3-L4: Mild disc desiccation. Disc space loss with circumferential disc bulge and superimposed broad-based right subarticular disc extrusion (series 3, image 5 and series 6, image 18). Moderate to severe facet and ligament flavum hypertrophy. Moderate to severe right lateral recess stenosis (descending right L4 nerve root level). Mild spinal stenosis. No convincing left lateral recess stenosis. Mild bilateral L3 neural foraminal stenosis.  L4-L5: Severe chronic disc space loss and vacuum disc. Circumferential disc osteophyte complex with broad-based posterior component. Moderate to severe facet and ligament flavum hypertrophy. No significant spinal stenosis. No convincing lateral recess stenosis. Mild left L4 neural foraminal stenosis.  L5-S1: Severe chronic disc space loss and vacuum disc. Circumferential but mostly far lateral disc osteophyte complex. Moderate facet hypertrophy. No spinal or lateral recess stenosis. Mild to moderate L5 foraminal stenosis greater on the left.  IMPRESSION: 1. The symptomatic level is favored to be L3-L4 where a broad-based right foraminal disc extrusion contributes to moderate to severe right lateral recess stenosis and mild spinal stenosis. Query right L4 radiculitis. 2. Advanced chronic disc and endplate degeneration at L4-L5 and L5-S1 with mild left L4 and mild to moderate left greater than right L5 neural foraminal stenosis. 3. No acute osseous abnormality.   Electronically Signed By: Genevie Ann M.D. On: 12/27/2016 08:39   She reports that she has never smoked. She has never used smokeless tobacco.  Recent Labs    03/22/17 1805  HGBA1C 5.5    Objective:  VS:  HT:    WT:   BMI:     BP:(!) 144/71   HR:67bpm  TEMP: ( )  RESP:  Physical Exam  Ortho Exam Imaging: No results found.  Past Medical/Family/Surgical/Social History: Medications & Allergies reviewed per EMR, new medications updated. Patient Active Problem List   Diagnosis Date Noted  . Disequilibrium   . Syncope 03/21/2017  . Dizziness   . Fall   . Unilateral primary osteoarthritis, left knee 02/26/2016  . Hemoptysis 03/18/2015  . Solitary pulmonary nodule 03/18/2015   Past Medical History:  Diagnosis Date  . AAA (abdominal aortic aneurysm) ([place])   . Arthritis    "knees" (03/21/2017)  . Borderline diabetes   . Bulging lumbar disc   . Depression   . Fibromyalgia   . GERD (gastroesophageal reflux disease)   . Glaucoma, both eyes   . High cholesterol    "don't have it but I take RX" (03/21/2017)  . Hypertension   . Syncope and collapse    Family History  Problem Relation Age of Onset  . Hypertension Father   . Stomach cancer Father   . Glaucoma Mother   . Diabetes Mother   . Diabetes Sister   . Diabetes Brother   . Lupus Child   . Diabetes Brother   . Lung cancer Brother   . Colon cancer Brother    Past Surgical History:  Procedure Laterality Date  . ABDOMINAL AORTIC ANEURYSM REPAIR  1966  . APPENDECTOMY  1966  . VAGINAL HYSTERECTOMY     "partial"  . VIDEO BRONCHOSCOPY Bilateral 03/19/2015   Procedure: VIDEO BRONCHOSCOPY WITHOUT FLUORO;  Surgeon: Rigoberto Noel, MD;  Location: [facility];  Service: Cardiopulmonary;  Laterality: Bilateral;  Social History   Occupational History  . Occupation: retired    Comment: Charity fundraiser; dietician  Tobacco Use  . Smoking status: Never Smoker  . Smokeless tobacco: Never Used  Substance and Sexual Activity  . Alcohol use: No  . Drug use: No  . Sexual activity: Not on file

## 2017-11-08 ENCOUNTER — Ambulatory Visit (INDEPENDENT_AMBULATORY_CARE_PROVIDER_SITE_OTHER): Payer: Medicare Other | Admitting: Specialist

## 2017-11-08 ENCOUNTER — Encounter (INDEPENDENT_AMBULATORY_CARE_PROVIDER_SITE_OTHER): Payer: Self-pay | Admitting: Specialist

## 2017-11-08 VITALS — BP 131/74 | HR 73 | Ht 61.0 in | Wt 165.0 lb

## 2017-11-08 DIAGNOSIS — M48062 Spinal stenosis, lumbar region with neurogenic claudication: Secondary | ICD-10-CM | POA: Diagnosis not present

## 2017-11-08 NOTE — Patient Instructions (Signed)
Avoid bending, stooping and avoid lifting weights greater than 10 lbs. Avoid prolong standing and walking. Avoid frequent bending and stooping  No lifting greater than 10 lbs. May use ice or moist heat for pain. Weight loss is of benefit. Handicap license is approved. Call us, Ask for [facility] my assistant or leave a message if the pain worsens and we will arrange further epidural injections and Dr. Romona Curls secretary/Assistant would then call to arrange for epidural steroid injection.

## 2017-11-08 NOTE — Progress Notes (Signed)
Office Visit Note   Patient: Cassandra Brooks           Date of Birth: 1941-04-09           MRN: [id number] Visit Date: 11/08/2017              Requested by: Lowella Dandy, NP [place], [place] [address] PCP: Lowella Dandy, NP   Assessment & Plan: Visit Diagnoses:  1. Spinal stenosis of lumbar region with neurogenic claudication     Plan:Avoid bending, stooping and avoid lifting weights greater than 10 lbs. Avoid prolong standing and walking. Avoid frequent bending and stooping  No lifting greater than 10 lbs. May use ice or moist heat for pain. Weight loss is of benefit. Handicap license is approved. Call us, Ask for [facility] my assistant or leave a message if the pain worsens and we will arrange further epidural injections and Dr. Romona Curls secretary/Assistant would then call to arrange for epidural steroid injection.    Follow-Up Instructions: No follow-ups on file.   Orders:  No orders of the defined types were placed in this encounter.  No orders of the defined types were placed in this encounter.     Procedures: No procedures performed   Clinical Data: No additional findings.   Subjective: Chief Complaint  Patient presents with  . Lower Back - Follow-up    She had a left L4-5 Interlaminar injection with Dr. Ernestina Patches on 10/23/2017--helped her about 72%    77 year old female with history of standing job for much of her life with left leg swelling that is chronic for the past 20 years. She was told it was due to arthritis in the left knee. She has had problems with neurogenic claudication.    Review of Systems  Constitutional: Negative.   HENT: Negative.   Eyes: Negative.   Respiratory: Negative.   Cardiovascular: Negative.   Gastrointestinal: Negative.   Endocrine: Negative.   Genitourinary: Negative.   Musculoskeletal: Negative.   Skin: Negative.   Allergic/Immunologic: Negative.   Neurological: Negative.   Hematological: Negative.    Psychiatric/Behavioral: Negative.      Objective: Vital Signs: BP 131/74 (BP Location: Left Arm, Patient Position: Sitting)   Pulse 73   Ht 5\' 1"  (1.549 m)   Wt 165 lb (74.8 kg)   BMI 31.18 kg/m   Physical Exam  Constitutional: She is oriented to person, place, and time. She appears well-developed and well-nourished.  HENT:  Head: Normocephalic and atraumatic.  Eyes: Pupils are equal, round, and reactive to light. EOM are normal.  Neck: Normal range of motion. Neck supple.  Pulmonary/Chest: Effort normal and breath sounds normal.  Abdominal: Soft. Bowel sounds are normal.  Neurological: She is alert and oriented to person, place, and time.  Skin: Skin is warm and dry.  Psychiatric: She has a normal mood and affect. Her behavior is normal. Judgment and thought content normal.    Back Exam   Tenderness  The patient is experiencing tenderness in the lumbar.  Range of Motion  Extension: abnormal  Flexion: normal  Lateral bend right: normal  Lateral bend left: normal  Rotation right: normal  Rotation left: normal   Muscle Strength  Right Quadriceps:  5/5  Left Quadriceps:  5/5  Right Hamstrings:  5/5  Left Hamstrings:  5/5   Tests  Straight leg raise right: negative Straight leg raise left: negative  Reflexes  Patellar: normal Achilles: normal Babinski's sign: normal  Other  Toe walk: normal Heel walk: normal Sensation: normal Gait: normal  Erythema: no back redness Scars: absent      Specialty Comments:  No specialty comments available.  Imaging: No results found.   PMFS History: Patient Active Problem List   Diagnosis Date Noted  . Disequilibrium   . Syncope 03/21/2017  . Dizziness   . Fall   . Unilateral primary osteoarthritis, left knee 02/26/2016  . Hemoptysis 03/18/2015  . Solitary pulmonary nodule 03/18/2015   Past Medical History:  Diagnosis Date  . AAA (abdominal aortic aneurysm) ([place])   . Arthritis    "knees" (03/21/2017)   . Borderline diabetes   . Bulging lumbar disc   . Depression   . Fibromyalgia   . GERD (gastroesophageal reflux disease)   . Glaucoma, both eyes   . High cholesterol    "don't have it but I take RX" (03/21/2017)  . Hypertension   . Syncope and collapse     Family History  Problem Relation Age of Onset  . Hypertension Father   . Stomach cancer Father   . Glaucoma Mother   . Diabetes Mother   . Diabetes Sister   . Diabetes Brother   . Lupus Child   . Diabetes Brother   . Lung cancer Brother   . Colon cancer Brother     Past Surgical History:  Procedure Laterality Date  . ABDOMINAL AORTIC ANEURYSM REPAIR  1966  . APPENDECTOMY  1966  . VAGINAL HYSTERECTOMY     "partial"  . VIDEO BRONCHOSCOPY Bilateral 03/19/2015   Procedure: VIDEO BRONCHOSCOPY WITHOUT FLUORO;  Surgeon: Rigoberto Noel, MD;  Location: [facility];  Service: Cardiopulmonary;  Laterality: Bilateral;   Social History   Occupational History  . Occupation: retired    Comment: Charity fundraiser; dietician  Tobacco Use  . Smoking status: Never Smoker  . Smokeless tobacco: Never Used  Substance and Sexual Activity  . Alcohol use: No  . Drug use: No  . Sexual activity: Not on file

## 2017-11-13 DIAGNOSIS — Z1339 Encounter for screening examination for other mental health and behavioral disorders: Secondary | ICD-10-CM | POA: Diagnosis not present

## 2017-11-13 DIAGNOSIS — I10 Essential (primary) hypertension: Secondary | ICD-10-CM | POA: Diagnosis not present

## 2017-11-13 DIAGNOSIS — R7301 Impaired fasting glucose: Secondary | ICD-10-CM | POA: Diagnosis not present

## 2017-11-13 DIAGNOSIS — E785 Hyperlipidemia, unspecified: Secondary | ICD-10-CM | POA: Diagnosis not present

## 2017-11-22 DIAGNOSIS — D044 Carcinoma in situ of skin of scalp and neck: Secondary | ICD-10-CM | POA: Diagnosis not present

## 2017-11-22 DIAGNOSIS — C4441 Basal cell carcinoma of skin of scalp and neck: Secondary | ICD-10-CM | POA: Diagnosis not present

## 2017-11-22 DIAGNOSIS — B351 Tinea unguium: Secondary | ICD-10-CM | POA: Diagnosis not present

## 2017-11-30 DIAGNOSIS — H4720 Unspecified optic atrophy: Secondary | ICD-10-CM | POA: Diagnosis not present

## 2017-11-30 DIAGNOSIS — H52203 Unspecified astigmatism, bilateral: Secondary | ICD-10-CM | POA: Diagnosis not present

## 2017-11-30 DIAGNOSIS — H401133 Primary open-angle glaucoma, bilateral, severe stage: Secondary | ICD-10-CM | POA: Diagnosis not present

## 2017-11-30 DIAGNOSIS — H25813 Combined forms of age-related cataract, bilateral: Secondary | ICD-10-CM | POA: Diagnosis not present

## 2017-12-04 DIAGNOSIS — C4441 Basal cell carcinoma of skin of scalp and neck: Secondary | ICD-10-CM | POA: Diagnosis not present

## 2018-02-26 DIAGNOSIS — Z1231 Encounter for screening mammogram for malignant neoplasm of breast: Secondary | ICD-10-CM | POA: Diagnosis not present

## 2018-03-22 DIAGNOSIS — R7301 Impaired fasting glucose: Secondary | ICD-10-CM | POA: Diagnosis not present

## 2018-03-22 DIAGNOSIS — M1990 Unspecified osteoarthritis, unspecified site: Secondary | ICD-10-CM | POA: Diagnosis not present

## 2018-03-22 DIAGNOSIS — E785 Hyperlipidemia, unspecified: Secondary | ICD-10-CM | POA: Diagnosis not present

## 2018-03-22 DIAGNOSIS — I10 Essential (primary) hypertension: Secondary | ICD-10-CM | POA: Diagnosis not present

## 2018-03-27 DIAGNOSIS — L570 Actinic keratosis: Secondary | ICD-10-CM | POA: Diagnosis not present

## 2018-03-27 DIAGNOSIS — C4441 Basal cell carcinoma of skin of scalp and neck: Secondary | ICD-10-CM | POA: Diagnosis not present

## 2018-03-30 DIAGNOSIS — H04123 Dry eye syndrome of bilateral lacrimal glands: Secondary | ICD-10-CM | POA: Diagnosis not present

## 2018-03-30 DIAGNOSIS — H401133 Primary open-angle glaucoma, bilateral, severe stage: Secondary | ICD-10-CM | POA: Diagnosis not present

## 2018-03-30 DIAGNOSIS — H25813 Combined forms of age-related cataract, bilateral: Secondary | ICD-10-CM | POA: Diagnosis not present

## 2018-04-14 DIAGNOSIS — Z23 Encounter for immunization: Secondary | ICD-10-CM | POA: Diagnosis not present

## 2018-07-27 DIAGNOSIS — R7301 Impaired fasting glucose: Secondary | ICD-10-CM | POA: Diagnosis not present

## 2018-07-27 DIAGNOSIS — I10 Essential (primary) hypertension: Secondary | ICD-10-CM | POA: Diagnosis not present

## 2018-07-27 DIAGNOSIS — Z9181 History of falling: Secondary | ICD-10-CM | POA: Diagnosis not present

## 2018-07-27 DIAGNOSIS — R42 Dizziness and giddiness: Secondary | ICD-10-CM | POA: Diagnosis not present

## 2018-07-27 DIAGNOSIS — M1990 Unspecified osteoarthritis, unspecified site: Secondary | ICD-10-CM | POA: Diagnosis not present

## 2018-07-27 DIAGNOSIS — E785 Hyperlipidemia, unspecified: Secondary | ICD-10-CM | POA: Diagnosis not present

## 2018-09-11 ENCOUNTER — Telehealth: Payer: Self-pay | Admitting: Physical Medicine and Rehabilitation

## 2018-09-11 NOTE — Telephone Encounter (Signed)
Ms. Crandall and her husband called and stated that she wanted another injection.  Please call and advise the patient on how she needs to proceed  Thanks

## 2018-09-12 ENCOUNTER — Telehealth: Payer: Self-pay | Admitting: *Deleted

## 2018-09-12 NOTE — Telephone Encounter (Signed)
Called pt and lvm #1 to get pt scheduled for appt.

## 2018-09-12 NOTE — Telephone Encounter (Signed)
ok 

## 2018-09-12 NOTE — Telephone Encounter (Signed)
Pt is scheduled for 09/26/2018 with driver and no BTs.

## 2018-09-19 DIAGNOSIS — H25813 Combined forms of age-related cataract, bilateral: Secondary | ICD-10-CM | POA: Diagnosis not present

## 2018-09-19 DIAGNOSIS — H04123 Dry eye syndrome of bilateral lacrimal glands: Secondary | ICD-10-CM | POA: Diagnosis not present

## 2018-09-19 DIAGNOSIS — H16103 Unspecified superficial keratitis, bilateral: Secondary | ICD-10-CM | POA: Diagnosis not present

## 2018-09-19 DIAGNOSIS — H401133 Primary open-angle glaucoma, bilateral, severe stage: Secondary | ICD-10-CM | POA: Diagnosis not present

## 2018-09-26 ENCOUNTER — Encounter: Payer: Self-pay | Admitting: Physical Medicine and Rehabilitation

## 2018-09-26 ENCOUNTER — Other Ambulatory Visit: Payer: Self-pay

## 2018-09-26 ENCOUNTER — Ambulatory Visit (INDEPENDENT_AMBULATORY_CARE_PROVIDER_SITE_OTHER): Payer: Medicare Other | Admitting: Physical Medicine and Rehabilitation

## 2018-09-26 ENCOUNTER — Ambulatory Visit: Payer: Self-pay

## 2018-09-26 VITALS — BP 152/74 | HR 66

## 2018-09-26 DIAGNOSIS — M5416 Radiculopathy, lumbar region: Secondary | ICD-10-CM

## 2018-09-26 MED ORDER — METHYLPREDNISOLONE ACETATE 80 MG/ML IJ SUSP
80.0000 mg | Freq: Once | INTRAMUSCULAR | Status: AC
  Administered 2018-09-26: 80 mg

## 2018-09-26 NOTE — Progress Notes (Signed)
.  Numeric Pain Rating Scale and Functional Assessment Average Pain 6   In the last MONTH (on 0-10 scale) has pain interfered with the following?  1. General activity like being  able to carry out your everyday physical activities such as walking, climbing stairs, carrying groceries, or moving a chair?  Rating(5)   +Driver, -BT, -Dye Allergies.  

## 2018-10-03 DIAGNOSIS — Z1331 Encounter for screening for depression: Secondary | ICD-10-CM | POA: Diagnosis not present

## 2018-10-03 DIAGNOSIS — Z1231 Encounter for screening mammogram for malignant neoplasm of breast: Secondary | ICD-10-CM | POA: Diagnosis not present

## 2018-10-03 DIAGNOSIS — Z9181 History of falling: Secondary | ICD-10-CM | POA: Diagnosis not present

## 2018-10-03 DIAGNOSIS — N959 Unspecified menopausal and perimenopausal disorder: Secondary | ICD-10-CM | POA: Diagnosis not present

## 2018-10-03 DIAGNOSIS — Z0000 Encounter for general adult medical examination without abnormal findings: Secondary | ICD-10-CM | POA: Diagnosis not present

## 2018-10-03 DIAGNOSIS — E785 Hyperlipidemia, unspecified: Secondary | ICD-10-CM | POA: Diagnosis not present

## 2018-10-04 DIAGNOSIS — L570 Actinic keratosis: Secondary | ICD-10-CM | POA: Diagnosis not present

## 2018-10-04 DIAGNOSIS — C4441 Basal cell carcinoma of skin of scalp and neck: Secondary | ICD-10-CM | POA: Diagnosis not present

## 2018-10-04 DIAGNOSIS — L578 Other skin changes due to chronic exposure to nonionizing radiation: Secondary | ICD-10-CM | POA: Diagnosis not present

## 2018-10-24 DIAGNOSIS — H04123 Dry eye syndrome of bilateral lacrimal glands: Secondary | ICD-10-CM | POA: Diagnosis not present

## 2018-10-24 DIAGNOSIS — H02055 Trichiasis without entropian left lower eyelid: Secondary | ICD-10-CM | POA: Diagnosis not present

## 2018-10-24 DIAGNOSIS — H401133 Primary open-angle glaucoma, bilateral, severe stage: Secondary | ICD-10-CM | POA: Diagnosis not present

## 2018-10-24 DIAGNOSIS — H02052 Trichiasis without entropian right lower eyelid: Secondary | ICD-10-CM | POA: Diagnosis not present

## 2018-10-26 DIAGNOSIS — Z20828 Contact with and (suspected) exposure to other viral communicable diseases: Secondary | ICD-10-CM | POA: Diagnosis not present

## 2018-11-12 NOTE — Progress Notes (Signed)
Cassandra Brooks - 78 y.o. female MRN [id number]  Date of birth: Mar 22, 1941  Office Visit Note: Visit Date: 09/26/2018 PCP: Lowella Dandy, NP Referred by: Lowella Dandy, NP  Subjective: Chief Complaint  Patient presents with  . Lower Back - Pain  . Left Leg - Pain  . Left Foot - Numbness   HPI:  Cassandra Brooks is a 78 y.o. female who comes in today For planned repeat left L4-5 interlaminar dural steroid injection.  Last injection was in July 2019.  Patient had disc herniation at L3-4 with facet arthropathy and actually right sided findings more than left-sided findings.  Her pain is consistently been left radicular pain.  Injection did seem to help quite a bit and she had recent return of symptoms.  Prior to that she had really failed all conservative care.  Her symptoms are very similar there is no focal weakness.  ROS Otherwise per HPI.  Assessment & Plan: Visit Diagnoses:  1. Lumbar radiculopathy     Plan: No additional findings.   Meds & Orders:  Meds ordered this encounter  Medications  . methylPREDNISolone acetate (DEPO-MEDROL) injection 80 mg    Orders Placed This Encounter  Procedures  . XR C-ARM NO REPORT  . Epidural Steroid injection    Follow-up: Return if symptoms worsen or fail to improve, for Basil Dess, M.D..   Procedures: No procedures performed  Lumbar Epidural Steroid Injection - Interlaminar Approach with Fluoroscopic Guidance  Patient: Karcyn Menn      Date of Birth: 1940-08-08 MRN: [id number] PCP: Lowella Dandy, NP      Visit Date: 09/26/2018   Universal Protocol:     Consent Given By: the patient  Position: PRONE  Additional Comments: Vital signs were monitored before and after the procedure. Patient was prepped and draped in the usual sterile fashion. The correct patient, procedure, and site was verified.   Injection Procedure Details:  Procedure Site One Meds Administered:  Meds ordered this encounter  Medications  .  methylPREDNISolone acetate (DEPO-MEDROL) injection 80 mg     Laterality: Left  Location/Site:  L4-L5  Needle size: 20 G  Needle type: Tuohy  Needle Placement: Paramedian epidural  Findings:   -Comments: Excellent flow of contrast into the epidural space.  Procedure Details: Using a paramedian approach from the side mentioned above, the region overlying the inferior lamina was localized under fluoroscopic visualization and the soft tissues overlying this structure were infiltrated with 4 ml. of 1% Lidocaine without Epinephrine. The Tuohy needle was inserted into the epidural space using a paramedian approach.   The epidural space was localized using loss of resistance along with lateral and bi-planar fluoroscopic views.  After negative aspirate for air, blood, and CSF, a 2 ml. volume of Isovue-250 was injected into the epidural space and the flow of contrast was observed. Radiographs were obtained for documentation purposes.    The injectate was administered into the level noted above.   Additional Comments:  The patient tolerated the procedure well Dressing: 2 x 2 sterile gauze and Band-Aid    Post-procedure details: Patient was observed during the procedure. Post-procedure instructions were reviewed.  Patient left the clinic in stable condition.   Clinical History: MRI LUMBAR SPINE WITHOUT CONTRAST  TECHNIQUE: Multiplanar, multisequence MR imaging of the lumbar spine was performed. No intravenous contrast was administered.  COMPARISON: Lumbar radiographs 12/06/2016. CT Abdomen and Pelvis 08/26/2013.  FINDINGS: Segmentation: Normal, as demonstrated on the comparisons.  Alignment: Chronic straightening of lumbar lordosis  has not significantly changed since 2015. No significant spondylolisthesis.  Vertebrae: Chronic degenerative endplate marrow signal changes at L4-L5 and L5-S1. No marrow edema or evidence of acute osseous abnormality. Visualized bone marrow signal  is within normal limits. Intact visible sacrum and SI joints.  Conus medullaris: Extends to the L1-L2 level and appears normal.  Paraspinal and other soft tissues: Negative.  Disc levels:  T11-T12: Negative.  T12-L1: Negative.  L1-L2: Minor disc desiccation and facet hypertrophy. No stenosis.  L2-L3: Minor disc desiccation and circumferential disc bulge. Mild facet and ligament flavum hypertrophy. No stenosis.  L3-L4: Mild disc desiccation. Disc space loss with circumferential disc bulge and superimposed broad-based right subarticular disc extrusion (series 3, image 5 and series 6, image 18). Moderate to severe facet and ligament flavum hypertrophy. Moderate to severe right lateral recess stenosis (descending right L4 nerve root level). Mild spinal stenosis. No convincing left lateral recess stenosis. Mild bilateral L3 neural foraminal stenosis.  L4-L5: Severe chronic disc space loss and vacuum disc. Circumferential disc osteophyte complex with broad-based posterior component. Moderate to severe facet and ligament flavum hypertrophy. No significant spinal stenosis. No convincing lateral recess stenosis. Mild left L4 neural foraminal stenosis.  L5-S1: Severe chronic disc space loss and vacuum disc. Circumferential but mostly far lateral disc osteophyte complex. Moderate facet hypertrophy. No spinal or lateral recess stenosis. Mild to moderate L5 foraminal stenosis greater on the left.  IMPRESSION: 1. The symptomatic level is favored to be L3-L4 where a broad-based right foraminal disc extrusion contributes to moderate to severe right lateral recess stenosis and mild spinal stenosis. Query right L4 radiculitis. 2. Advanced chronic disc and endplate degeneration at L4-L5 and L5-S1 with mild left L4 and mild to moderate left greater than right L5 neural foraminal stenosis. 3. No acute osseous abnormality.   Electronically Signed By: Genevie Ann M.D. On: 12/27/2016 08:39      Objective:  VS:  HT:    WT:   BMI:     BP:(!) 152/74  HR:66bpm  TEMP: ( )  RESP:  Physical Exam  Ortho Exam Imaging: No results found.

## 2018-11-12 NOTE — Procedures (Signed)
Lumbar Epidural Steroid Injection - Interlaminar Approach with Fluoroscopic Guidance  Patient: Cassandra Brooks      Date of Birth: 07-05-1940 MRN: [id number] PCP: Lowella Dandy, NP      Visit Date: 09/26/2018   Universal Protocol:     Consent Given By: the patient  Position: PRONE  Additional Comments: Vital signs were monitored before and after the procedure. Patient was prepped and draped in the usual sterile fashion. The correct patient, procedure, and site was verified.   Injection Procedure Details:  Procedure Site One Meds Administered:  Meds ordered this encounter  Medications  . methylPREDNISolone acetate (DEPO-MEDROL) injection 80 mg     Laterality: Left  Location/Site:  L4-L5  Needle size: 20 G  Needle type: Tuohy  Needle Placement: Paramedian epidural  Findings:   -Comments: Excellent flow of contrast into the epidural space.  Procedure Details: Using a paramedian approach from the side mentioned above, the region overlying the inferior lamina was localized under fluoroscopic visualization and the soft tissues overlying this structure were infiltrated with 4 ml. of 1% Lidocaine without Epinephrine. The Tuohy needle was inserted into the epidural space using a paramedian approach.   The epidural space was localized using loss of resistance along with lateral and bi-planar fluoroscopic views.  After negative aspirate for air, blood, and CSF, a 2 ml. volume of Isovue-250 was injected into the epidural space and the flow of contrast was observed. Radiographs were obtained for documentation purposes.    The injectate was administered into the level noted above.   Additional Comments:  The patient tolerated the procedure well Dressing: 2 x 2 sterile gauze and Band-Aid    Post-procedure details: Patient was observed during the procedure. Post-procedure instructions were reviewed.  Patient left the clinic in stable condition.

## 2018-11-23 DIAGNOSIS — H25813 Combined forms of age-related cataract, bilateral: Secondary | ICD-10-CM | POA: Diagnosis not present

## 2018-11-23 DIAGNOSIS — H02055 Trichiasis without entropian left lower eyelid: Secondary | ICD-10-CM | POA: Diagnosis not present

## 2018-11-23 DIAGNOSIS — H04123 Dry eye syndrome of bilateral lacrimal glands: Secondary | ICD-10-CM | POA: Diagnosis not present

## 2018-11-23 DIAGNOSIS — H401133 Primary open-angle glaucoma, bilateral, severe stage: Secondary | ICD-10-CM | POA: Diagnosis not present

## 2018-11-27 DIAGNOSIS — R7301 Impaired fasting glucose: Secondary | ICD-10-CM | POA: Diagnosis not present

## 2018-11-27 DIAGNOSIS — Z139 Encounter for screening, unspecified: Secondary | ICD-10-CM | POA: Diagnosis not present

## 2018-11-27 DIAGNOSIS — I10 Essential (primary) hypertension: Secondary | ICD-10-CM | POA: Diagnosis not present

## 2018-11-27 DIAGNOSIS — E785 Hyperlipidemia, unspecified: Secondary | ICD-10-CM | POA: Diagnosis not present

## 2019-02-04 DIAGNOSIS — M19071 Primary osteoarthritis, right ankle and foot: Secondary | ICD-10-CM | POA: Diagnosis not present

## 2019-02-04 DIAGNOSIS — M65871 Other synovitis and tenosynovitis, right ankle and foot: Secondary | ICD-10-CM | POA: Diagnosis not present

## 2019-02-04 DIAGNOSIS — M25571 Pain in right ankle and joints of right foot: Secondary | ICD-10-CM | POA: Diagnosis not present

## 2019-02-28 DIAGNOSIS — M19071 Primary osteoarthritis, right ankle and foot: Secondary | ICD-10-CM | POA: Diagnosis not present

## 2019-02-28 DIAGNOSIS — Z23 Encounter for immunization: Secondary | ICD-10-CM | POA: Diagnosis not present

## 2019-02-28 DIAGNOSIS — M65871 Other synovitis and tenosynovitis, right ankle and foot: Secondary | ICD-10-CM | POA: Diagnosis not present

## 2019-03-01 ENCOUNTER — Telehealth: Payer: Self-pay | Admitting: Physical Medicine and Rehabilitation

## 2019-03-01 DIAGNOSIS — N959 Unspecified menopausal and perimenopausal disorder: Secondary | ICD-10-CM | POA: Diagnosis not present

## 2019-03-01 DIAGNOSIS — Z1231 Encounter for screening mammogram for malignant neoplasm of breast: Secondary | ICD-10-CM | POA: Diagnosis not present

## 2019-03-01 DIAGNOSIS — M8589 Other specified disorders of bone density and structure, multiple sites: Secondary | ICD-10-CM | POA: Diagnosis not present

## 2019-03-01 NOTE — Telephone Encounter (Signed)
Tell them the last injection I did was for her spine and that if that helped we can repeat. Can tell them we have done knee injections. If truly feels knee related then f/up [place]

## 2019-03-01 NOTE — Telephone Encounter (Signed)
Left message #1

## 2019-03-06 DIAGNOSIS — H401133 Primary open-angle glaucoma, bilateral, severe stage: Secondary | ICD-10-CM | POA: Diagnosis not present

## 2019-03-06 DIAGNOSIS — H04123 Dry eye syndrome of bilateral lacrimal glands: Secondary | ICD-10-CM | POA: Diagnosis not present

## 2019-03-06 DIAGNOSIS — H02051 Trichiasis without entropian right upper eyelid: Secondary | ICD-10-CM | POA: Diagnosis not present

## 2019-03-06 NOTE — Telephone Encounter (Signed)
Scheduled for repeat ESI on 12/17 with driver and no blood thinners.

## 2019-03-28 ENCOUNTER — Ambulatory Visit: Payer: Self-pay

## 2019-03-28 ENCOUNTER — Other Ambulatory Visit: Payer: Self-pay

## 2019-03-28 ENCOUNTER — Encounter: Payer: Self-pay | Admitting: Physical Medicine and Rehabilitation

## 2019-03-28 ENCOUNTER — Ambulatory Visit (INDEPENDENT_AMBULATORY_CARE_PROVIDER_SITE_OTHER): Payer: Medicare Other | Admitting: Physical Medicine and Rehabilitation

## 2019-03-28 VITALS — BP 147/88 | HR 89

## 2019-03-28 DIAGNOSIS — M5416 Radiculopathy, lumbar region: Secondary | ICD-10-CM

## 2019-03-28 MED ORDER — METHYLPREDNISOLONE ACETATE 80 MG/ML IJ SUSP
80.0000 mg | Freq: Once | INTRAMUSCULAR | Status: AC
  Administered 2019-03-28: 14:00:00 80 mg

## 2019-03-28 NOTE — Progress Notes (Signed)
.  Numeric Pain Rating Scale and Functional Assessment Average Pain 7   In the last MONTH (on 0-10 scale) has pain interfered with the following?  1. General activity like being  able to carry out your everyday physical activities such as walking, climbing stairs, carrying groceries, or moving a chair?  Rating(8)   +Driver, -BT, -Dye Allergies.  

## 2019-04-01 NOTE — Procedures (Signed)
Lumbar Epidural Steroid Injection - Interlaminar Approach with Fluoroscopic Guidance  Patient: Cassandra Brooks      Date of Birth: 16-Mar-1941 MRN: [id number] PCP: Lowella Dandy, NP      Visit Date: 03/28/2019   Universal Protocol:     Consent Given By: the patient  Position: PRONE  Additional Comments: Vital signs were monitored before and after the procedure. Patient was prepped and draped in the usual sterile fashion. The correct patient, procedure, and site was verified.   Injection Procedure Details:  Procedure Site One Meds Administered:  Meds ordered this encounter  Medications  . methylPREDNISolone acetate (DEPO-MEDROL) injection 80 mg     Laterality: Left  Location/Site:  L4-L5  Needle size: 20 G  Needle type: Tuohy  Needle Placement: Paramedian epidural  Findings:   -Comments: Excellent flow of contrast into the epidural space.  Procedure Details: Using a paramedian approach from the side mentioned above, the region overlying the inferior lamina was localized under fluoroscopic visualization and the soft tissues overlying this structure were infiltrated with 4 ml. of 1% Lidocaine without Epinephrine. The Tuohy needle was inserted into the epidural space using a paramedian approach.   The epidural space was localized using loss of resistance along with lateral and bi-planar fluoroscopic views.  After negative aspirate for air, blood, and CSF, a 2 ml. volume of Isovue-250 was injected into the epidural space and the flow of contrast was observed. Radiographs were obtained for documentation purposes.    The injectate was administered into the level noted above.   Additional Comments:  The patient tolerated the procedure well Dressing: 2 x 2 sterile gauze and Band-Aid    Post-procedure details: Patient was observed during the procedure. Post-procedure instructions were reviewed.  Patient left the clinic in stable condition.

## 2019-04-01 NOTE — Progress Notes (Signed)
Cassandra Brooks - 78 y.o. female MRN [id number]  Date of birth: [birth year]  Office Visit Note: Visit Date: 03/28/2019 PCP: Lowella Dandy, NP Referred by: Lowella Dandy, NP  Subjective: Chief Complaint  Patient presents with  . Lower Back - Pain  . Right Leg - Pain  . Left Leg - Pain   HPI: Cassandra Brooks is a 78 y.o. female who comes in today For planned repeat L4-5 interlaminar epidural steroid injection.  Patient is seeing me on 2 prior occasions.  I first saw her in June 2019 completed injection which was very beneficial for her for quite some time.  We then saw her in July of this year and the injection did help once again but lasted somewhat less of a time but she also had a lot of issues going on with her family medically.  She was initially seen and evaluated and followed by Dr. Basil Dess.  She has not had follow-up with him.  She has had no new complaints other than the symptoms seem to be a little bit more bilateral than left side.  She actually had disc herniation on the right at L3-4 but was having more left-sided symptoms and we saw her.  I think is wise to complete the repeat injection since she actually did get relief and it seems to be related to that disc herniation and likely some lateral recess narrowing.  She has multilevel spondylosis and degenerative changes but without high-grade central stenosis.  Depending on relief would look at follow-up with Dr. Basil Dess for further evaluation.  ROS Otherwise per HPI.  Assessment & Plan: Visit Diagnoses:  1. Lumbar radiculopathy     Plan: No additional findings.   Meds & Orders:  Meds ordered this encounter  Medications  . methylPREDNISolone acetate (DEPO-MEDROL) injection 80 mg    Orders Placed This Encounter  Procedures  . XR C-ARM NO REPORT  . Epidural Steroid injection    Follow-up: Return if symptoms worsen or fail to improve.   Procedures: No procedures performed  Lumbar Epidural Steroid Injection -  Interlaminar Approach with Fluoroscopic Guidance  Patient: Cassandra Brooks      Date of Birth: May 27, 1940 MRN: [id number] PCP: Lowella Dandy, NP      Visit Date: 03/28/2019   Universal Protocol:     Consent Given By: the patient  Position: PRONE  Additional Comments: Vital signs were monitored before and after the procedure. Patient was prepped and draped in the usual sterile fashion. The correct patient, procedure, and site was verified.   Injection Procedure Details:  Procedure Site One Meds Administered:  Meds ordered this encounter  Medications  . methylPREDNISolone acetate (DEPO-MEDROL) injection 80 mg     Laterality: Left  Location/Site:  L4-L5  Needle size: 20 G  Needle type: Tuohy  Needle Placement: Paramedian epidural  Findings:   -Comments: Excellent flow of contrast into the epidural space.  Procedure Details: Using a paramedian approach from the side mentioned above, the region overlying the inferior lamina was localized under fluoroscopic visualization and the soft tissues overlying this structure were infiltrated with 4 ml. of 1% Lidocaine without Epinephrine. The Tuohy needle was inserted into the epidural space using a paramedian approach.   The epidural space was localized using loss of resistance along with lateral and bi-planar fluoroscopic views.  After negative aspirate for air, blood, and CSF, a 2 ml. volume of Isovue-250 was injected into the epidural space and the flow of contrast was observed.  Radiographs were obtained for documentation purposes.    The injectate was administered into the level noted above.   Additional Comments:  The patient tolerated the procedure well Dressing: 2 x 2 sterile gauze and Band-Aid    Post-procedure details: Patient was observed during the procedure. Post-procedure instructions were reviewed.  Patient left the clinic in stable condition.    Clinical History: MRI LUMBAR SPINE WITHOUT  CONTRAST  TECHNIQUE: Multiplanar, multisequence MR imaging of the lumbar spine was performed. No intravenous contrast was administered.  COMPARISON: Lumbar radiographs 12/06/2016. CT Abdomen and Pelvis 08/26/2013.  FINDINGS: Segmentation: Normal, as demonstrated on the comparisons.  Alignment: Chronic straightening of lumbar lordosis has not significantly changed since 2015. No significant spondylolisthesis.  Vertebrae: Chronic degenerative endplate marrow signal changes at L4-L5 and L5-S1. No marrow edema or evidence of acute osseous abnormality. Visualized bone marrow signal is within normal limits. Intact visible sacrum and SI joints.  Conus medullaris: Extends to the L1-L2 level and appears normal.  Paraspinal and other soft tissues: Negative.  Disc levels:  T11-T12: Negative.  T12-L1: Negative.  L1-L2: Minor disc desiccation and facet hypertrophy. No stenosis.  L2-L3: Minor disc desiccation and circumferential disc bulge. Mild facet and ligament flavum hypertrophy. No stenosis.  L3-L4: Mild disc desiccation. Disc space loss with circumferential disc bulge and superimposed broad-based right subarticular disc extrusion (series 3, image 5 and series 6, image 18). Moderate to severe facet and ligament flavum hypertrophy. Moderate to severe right lateral recess stenosis (descending right L4 nerve root level). Mild spinal stenosis. No convincing left lateral recess stenosis. Mild bilateral L3 neural foraminal stenosis.  L4-L5: Severe chronic disc space loss and vacuum disc. Circumferential disc osteophyte complex with broad-based posterior component. Moderate to severe facet and ligament flavum hypertrophy. No significant spinal stenosis. No convincing lateral recess stenosis. Mild left L4 neural foraminal stenosis.  L5-S1: Severe chronic disc space loss and vacuum disc. Circumferential but mostly far lateral disc osteophyte complex. Moderate facet hypertrophy. No  spinal or lateral recess stenosis. Mild to moderate L5 foraminal stenosis greater on the left.  IMPRESSION: 1. The symptomatic level is favored to be L3-L4 where a broad-based right foraminal disc extrusion contributes to moderate to severe right lateral recess stenosis and mild spinal stenosis. Query right L4 radiculitis. 2. Advanced chronic disc and endplate degeneration at L4-L5 and L5-S1 with mild left L4 and mild to moderate left greater than right L5 neural foraminal stenosis. 3. No acute osseous abnormality.   Electronically Signed By: Genevie Ann M.D. On: 12/27/2016 08:39   She reports that she has never smoked. She has never used smokeless tobacco. No results for input(s): HGBA1C, LABURIC in the last 8760 hours.  Objective:  VS:  HT:    WT:   BMI:     BP:(!) 147/88  HR:89bpm  TEMP: ( )  RESP:  Physical Exam Constitutional:      General: She is not in acute distress.    Appearance: Normal appearance. She is not ill-appearing.  HENT:     Head: Normocephalic and atraumatic.     Right Ear: External ear normal.     Left Ear: External ear normal.  Eyes:     Extraocular Movements: Extraocular movements intact.  Cardiovascular:     Rate and Rhythm: Normal rate.     Pulses: Normal pulses.  Musculoskeletal:     Right lower leg: No edema.     Left lower leg: No edema.     Comments: Patient has good distal strength with  no pain over the greater trochanters.  No clonus or focal weakness.  Skin:    Findings: No erythema, lesion or rash.  Neurological:     General: No focal deficit present.     Mental Status: She is alert and oriented to person, place, and time.     Sensory: No sensory deficit.     Motor: No weakness or abnormal muscle tone.     Coordination: Coordination normal.  Psychiatric:        Mood and Affect: Mood normal.        Behavior: Behavior normal.     Ortho Exam Imaging: No results found.  Past Medical/Family/Surgical/Social History: Medications &  Allergies reviewed per EMR, new medications updated. Patient Active Problem List   Diagnosis Date Noted  . Disequilibrium   . Syncope 03/21/2017  . Dizziness   . Fall   . Unilateral primary osteoarthritis, left knee 02/26/2016  . Hemoptysis 03/18/2015  . Solitary pulmonary nodule 03/18/2015   Past Medical History:  Diagnosis Date  . AAA (abdominal aortic aneurysm) ([place])   . Arthritis    "knees" (03/21/2017)  . Borderline diabetes   . Bulging lumbar disc   . Depression   . Fibromyalgia   . GERD (gastroesophageal reflux disease)   . Glaucoma, both eyes   . High cholesterol    "don't have it but I take RX" (03/21/2017)  . Hypertension   . Syncope and collapse    Family History  Problem Relation Age of Onset  . Hypertension Father   . Stomach cancer Father   . Glaucoma Mother   . Diabetes Mother   . Diabetes Sister   . Diabetes Brother   . Lupus Child   . Diabetes Brother   . Lung cancer Brother   . Colon cancer Brother    Past Surgical History:  Procedure Laterality Date  . ABDOMINAL AORTIC ANEURYSM REPAIR  1966  . APPENDECTOMY  1966  . VAGINAL HYSTERECTOMY     "partial"  . VIDEO BRONCHOSCOPY Bilateral 03/19/2015   Procedure: VIDEO BRONCHOSCOPY WITHOUT FLUORO;  Surgeon: Rigoberto Noel, MD;  Location: [facility];  Service: Cardiopulmonary;  Laterality: Bilateral;   Social History   Occupational History  . Occupation: retired    Comment: Charity fundraiser; dietician  Tobacco Use  . Smoking status: Never Smoker  . Smokeless tobacco: Never Used  Substance and Sexual Activity  . Alcohol use: No  . Drug use: No  . Sexual activity: Not on file

## 2019-06-28 ENCOUNTER — Telehealth: Payer: Self-pay | Admitting: Physical Medicine and Rehabilitation

## 2019-06-28 NOTE — Telephone Encounter (Signed)
Obviously if she feels the knee pain is relater to her spine and last injection helped that then ok, otherwise if really feeling this is the knee she can follow up with Dr. Louanne Skye and he can help with eval and tx.

## 2019-07-01 NOTE — Telephone Encounter (Signed)
Patient states the pain is only in her knee. She will schedule an OV with Dr. Louanne Skye to have this evaluated.

## 2019-07-04 DIAGNOSIS — H52203 Unspecified astigmatism, bilateral: Secondary | ICD-10-CM | POA: Diagnosis not present

## 2019-07-04 DIAGNOSIS — H401133 Primary open-angle glaucoma, bilateral, severe stage: Secondary | ICD-10-CM | POA: Diagnosis not present

## 2019-07-04 DIAGNOSIS — H25813 Combined forms of age-related cataract, bilateral: Secondary | ICD-10-CM | POA: Diagnosis not present

## 2019-07-04 DIAGNOSIS — H04123 Dry eye syndrome of bilateral lacrimal glands: Secondary | ICD-10-CM | POA: Diagnosis not present

## 2019-07-18 ENCOUNTER — Ambulatory Visit (INDEPENDENT_AMBULATORY_CARE_PROVIDER_SITE_OTHER): Payer: Medicare Other

## 2019-07-18 ENCOUNTER — Encounter: Payer: Self-pay | Admitting: Orthopaedic Surgery

## 2019-07-18 ENCOUNTER — Ambulatory Visit (INDEPENDENT_AMBULATORY_CARE_PROVIDER_SITE_OTHER): Payer: Medicare Other | Admitting: Orthopaedic Surgery

## 2019-07-18 ENCOUNTER — Other Ambulatory Visit: Payer: Self-pay

## 2019-07-18 ENCOUNTER — Other Ambulatory Visit: Payer: Self-pay | Admitting: Orthopaedic Surgery

## 2019-07-18 VITALS — Ht 61.5 in | Wt 160.0 lb

## 2019-07-18 DIAGNOSIS — M25562 Pain in left knee: Secondary | ICD-10-CM

## 2019-07-18 DIAGNOSIS — M1712 Unilateral primary osteoarthritis, left knee: Secondary | ICD-10-CM

## 2019-07-18 DIAGNOSIS — G8929 Other chronic pain: Secondary | ICD-10-CM | POA: Diagnosis not present

## 2019-07-18 DIAGNOSIS — M25561 Pain in right knee: Secondary | ICD-10-CM

## 2019-07-18 HISTORY — DX: Unilateral primary osteoarthritis, left knee: M17.12

## 2019-07-18 MED ORDER — LIDOCAINE HCL 1 % IJ SOLN
2.0000 mL | INTRAMUSCULAR | Status: AC | PRN
  Administered 2019-07-18: 2 mL

## 2019-07-18 MED ORDER — METHYLPREDNISOLONE ACETATE 40 MG/ML IJ SUSP
40.0000 mg | INTRAMUSCULAR | Status: AC | PRN
  Administered 2019-07-18: 40 mg via INTRA_ARTICULAR

## 2019-07-18 MED ORDER — BUPIVACAINE HCL 0.5 % IJ SOLN
2.0000 mL | INTRAMUSCULAR | Status: AC | PRN
  Administered 2019-07-18: 2 mL via INTRA_ARTICULAR

## 2019-07-18 NOTE — Progress Notes (Signed)
Office Visit Note   Patient: Cassandra Brooks           Date of Birth: 1940/12/08           MRN: [id number] Visit Date: 07/18/2019              Requested by: Lowella Dandy, NP [place],  [place] [id number] PCP: Lowella Dandy, NP   Assessment & Plan: Visit Diagnoses:  1. Primary osteoarthritis of left knee     Plan: Impression is advanced tricompartmental degenerative joint disease with valgus deformity.  Based on discussion of treatment options patient agreed to a cortisone injection today.  We also briefly discussed the option of viscosupplementation injections should the cortisone not be effective.  We will see her back as needed.  Follow-Up Instructions: Return if symptoms worsen or fail to improve.   Orders:  Orders Placed This Encounter  Procedures  . Large Joint Inj   No orders of the defined types were placed in this encounter.     Procedures: Large Joint Inj: L knee on 07/18/2019 9:01 AM Details: 22 G needle Medications: 2 mL bupivacaine 0.5 %; 2 mL lidocaine 1 %; 40 mg methylPREDNISolone acetate 40 MG/ML Outcome: tolerated well, no immediate complications Patient was prepped and draped in the usual sterile fashion.       Clinical Data: No additional findings.   Subjective: Chief Complaint  Patient presents with  . Left Knee - Pain    Cassandra Brooks is a 79 year old female who is well-known to me who comes in for evaluation of left knee pain with worsening over the last several years.  I have seen her for this in the past.  She is a very active 79 year old and she is looking for some relief to help her remain active.  She feels like the knee is grinding and occasionally gives way.  Denies any mechanical symptoms.  She uses Tylenol for the pain.  She has pain with range of motion of the knee.   Review of Systems  Constitutional: Negative.   HENT: Negative.   Eyes: Negative.   Respiratory: Negative.   Cardiovascular: Negative.   Endocrine:  Negative.   Musculoskeletal: Negative.   Neurological: Negative.   Hematological: Negative.   Psychiatric/Behavioral: Negative.   All other systems reviewed and are negative.    Objective: Vital Signs: Ht 5' 1.5" (1.562 m)   Wt 160 lb (72.6 kg)   BMI 29.74 kg/m   Physical Exam Vitals and nursing note reviewed.  Constitutional:      Appearance: She is well-developed.  Pulmonary:     Effort: Pulmonary effort is normal.  Skin:    General: Skin is warm.     Capillary Refill: Capillary refill takes less than 2 seconds.  Neurological:     Mental Status: She is alert and oriented to person, place, and time.  Psychiatric:        Behavior: Behavior normal.        Thought Content: Thought content normal.        Judgment: Judgment normal.     Ortho Exam Left knee shows a small joint effusion.  Patellofemoral crepitus.  Painful range of motion with mild restriction.  Valgus deformity. Specialty Comments:  No specialty comments available.  Imaging: XR KNEE 3 VIEW LEFT  Result Date: 07/18/2019 Severe tricompartmental DJD with valgus deformity.    PMFS History: Patient Active Problem List   Diagnosis Date Noted  . Primary osteoarthritis of  left knee 07/18/2019  . Disequilibrium   . Syncope 03/21/2017  . Dizziness   . Fall   . Hemoptysis 03/18/2015  . Solitary pulmonary nodule 03/18/2015   Past Medical History:  Diagnosis Date  . AAA (abdominal aortic aneurysm) ([place])   . Arthritis    "knees" (03/21/2017)  . Borderline diabetes   . Bulging lumbar disc   . Depression   . Fibromyalgia   . GERD (gastroesophageal reflux disease)   . Glaucoma, both eyes   . High cholesterol    "don't have it but I take RX" (03/21/2017)  . Hypertension   . Syncope and collapse     Family History  Problem Relation Age of Onset  . Hypertension Father   . Stomach cancer Father   . Glaucoma Mother   . Diabetes Mother   . Diabetes Sister   . Diabetes Brother   . Lupus Child   .  Diabetes Brother   . Lung cancer Brother   . Colon cancer Brother     Past Surgical History:  Procedure Laterality Date  . ABDOMINAL AORTIC ANEURYSM REPAIR  1966  . APPENDECTOMY  1966  . VAGINAL HYSTERECTOMY     "partial"  . VIDEO BRONCHOSCOPY Bilateral 03/19/2015   Procedure: VIDEO BRONCHOSCOPY WITHOUT FLUORO;  Surgeon: Rigoberto Noel, MD;  Location: [facility];  Service: Cardiopulmonary;  Laterality: Bilateral;   Social History   Occupational History  . Occupation: retired    Comment: Charity fundraiser; dietician  Tobacco Use  . Smoking status: Never Smoker  . Smokeless tobacco: Never Used  Substance and Sexual Activity  . Alcohol use: No  . Drug use: No  . Sexual activity: Not on file

## 2019-07-31 ENCOUNTER — Ambulatory Visit (INDEPENDENT_AMBULATORY_CARE_PROVIDER_SITE_OTHER): Payer: Medicare Other

## 2019-07-31 ENCOUNTER — Other Ambulatory Visit: Payer: Self-pay

## 2019-07-31 ENCOUNTER — Encounter: Payer: Self-pay | Admitting: Specialist

## 2019-07-31 ENCOUNTER — Ambulatory Visit (INDEPENDENT_AMBULATORY_CARE_PROVIDER_SITE_OTHER): Payer: Medicare Other | Admitting: Specialist

## 2019-07-31 VITALS — BP 138/86 | HR 72 | Ht 61.5 in | Wt 160.0 lb

## 2019-07-31 DIAGNOSIS — M1712 Unilateral primary osteoarthritis, left knee: Secondary | ICD-10-CM

## 2019-07-31 DIAGNOSIS — M2170 Unequal limb length (acquired), unspecified site: Secondary | ICD-10-CM

## 2019-07-31 DIAGNOSIS — M48062 Spinal stenosis, lumbar region with neurogenic claudication: Secondary | ICD-10-CM | POA: Diagnosis not present

## 2019-07-31 DIAGNOSIS — M47816 Spondylosis without myelopathy or radiculopathy, lumbar region: Secondary | ICD-10-CM

## 2019-07-31 NOTE — Progress Notes (Signed)
Office Visit Note   Patient: Cassandra Brooks           Date of Birth: 09-03-1940           MRN: [id number] Visit Date: 07/31/2019              Requested by: Lowella Dandy, NP [place],  [place] [id number] PCP: Lowella Dandy, NP   Assessment & Plan: Visit Diagnoses:  1. Spinal stenosis of lumbar region with neurogenic claudication   2. Leg length discrepancy   3. Primary osteoarthritis of left knee   4. Spondylosis without myelopathy or radiculopathy, lumbar region     Plan: Avoid bending, stooping and avoid lifting weights greater than 10 lbs. Avoid prolong standing and walking. Avoid frequent bending and stooping  No lifting greater than 10 lbs. May use ice or moist heat for pain. Weight loss is of benefit. Handicap license is approved. Dr. Romona Curls secretary/Assistant will call to arrange for epidural steroid injection  Left leg 5/16ths inch heel lift to assess for decreasing pain with improved leveling of the spine.   Follow-Up Instructions: Return in about 4 weeks (around 08/28/2019).   Orders:  Orders Placed This Encounter  Procedures  . XR Lumbar Spine 2-3 Views   No orders of the defined types were placed in this encounter.     Procedures: No procedures performed   Clinical Data: No additional findings.   Subjective: Chief Complaint  Patient presents with  . Lower Back - Pain    79 year old female with history of right L3-4 HNP and foramenal stenosis. She has mainly left sided pain and has seen Dr. Ernestina Patches for injections left L4-5 transforamenal. The injections in 09/2018 and 03/2019 seem to help and she takes advil or tylenol and an 8 hour arthritis medication. No bowel or bladder discomfort. She is not grocery shopping much due to COVID precautions and she has recently been vaccinated. She is having pain that is in the mid upper lumbar spine. Worse after bending and picking up pecans from her trees following Last ESI she reports strong  winds brought down the pecans.   Review of Systems  Constitutional: Negative.  Negative for activity change, appetite change, chills, diaphoresis, fatigue, fever and unexpected weight change.  HENT: Negative.  Negative for congestion, dental problem, drooling, ear discharge, ear pain, facial swelling, hearing loss, mouth sores, nosebleeds, postnasal drip, rhinorrhea, sinus pressure, sinus pain, sneezing, sore throat, tinnitus, trouble swallowing and voice change.   Eyes: Negative.  Negative for photophobia, pain, discharge, redness, itching and visual disturbance.  Respiratory: Negative.  Negative for apnea, cough, choking, chest tightness, shortness of breath, wheezing and stridor.   Cardiovascular: Negative.  Negative for chest pain, palpitations and leg swelling.  Gastrointestinal: Negative.  Negative for abdominal distention, abdominal pain, anal bleeding, blood in stool, constipation, diarrhea, nausea, rectal pain and vomiting.  Endocrine: Negative.  Negative for cold intolerance, heat intolerance, polydipsia, polyphagia and polyuria.  Genitourinary: Negative.  Negative for difficulty urinating, dyspareunia, dysuria, enuresis, flank pain, frequency, genital sores, hematuria, menstrual problem and pelvic pain.  Musculoskeletal: Positive for back pain and gait problem. Negative for arthralgias, joint swelling, myalgias, neck pain and neck stiffness.  Skin: Negative.  Negative for color change, pallor, rash and wound.  Allergic/Immunologic: Negative.  Negative for environmental allergies, food allergies and immunocompromised state.  Neurological: Positive for weakness. Negative for dizziness, tremors, seizures, syncope, facial asymmetry, speech difficulty, light-headedness, numbness and headaches.  Hematological:  Negative.  Negative for adenopathy. Does not bruise/bleed easily.  Psychiatric/Behavioral: Negative.  Negative for agitation, behavioral problems, confusion, decreased concentration,  dysphoric mood, hallucinations, self-injury, sleep disturbance and suicidal ideas. The patient is not nervous/anxious and is not hyperactive.      Objective: Vital Signs: BP 138/86 (BP Location: Left Arm, Patient Position: Sitting)   Pulse 72   Ht 5' 1.5" (1.562 m)   Wt 160 lb (72.6 kg)   BMI 29.74 kg/m   Physical Exam Constitutional:      Appearance: She is well-developed.  HENT:     Head: Normocephalic and atraumatic.  Eyes:     Pupils: Pupils are equal, round, and reactive to light.  Pulmonary:     Effort: Pulmonary effort is normal.     Breath sounds: Normal breath sounds.  Abdominal:     General: Bowel sounds are normal.     Palpations: Abdomen is soft.  Musculoskeletal:        General: Normal range of motion.     Cervical back: Normal range of motion and neck supple.     Lumbar back: Negative right straight leg raise test and negative left straight leg raise test.  Skin:    General: Skin is warm and dry.  Neurological:     Mental Status: She is alert and oriented to person, place, and time.  Psychiatric:        Behavior: Behavior normal.        Thought Content: Thought content normal.        Judgment: Judgment normal.     Back Exam   Tenderness  The patient is experiencing tenderness in the lumbar.  Muscle Strength  Right Quadriceps:  5/5  Left Quadriceps:  5/5  Right Hamstrings:  5/5  Left Hamstrings:  5/5   Tests  Straight leg raise right: negative Straight leg raise left: negative  Reflexes  Patellar: 2/4 Achilles: 2/4 Babinski's sign: normal   Other  Toe walk: normal Heel walk: normal      Specialty Comments:  No specialty comments available.  Imaging: XR Lumbar Spine 2-3 Views  Result Date: 07/31/2019 Left leg with 1/2 inch shortening, DDD L3-4, L4-5 and L5-S1 lowest 2 levels greater than the others. Left SI joint with sclerosis. History of polio left leg as child.     PMFS History: Patient Active Problem List   Diagnosis Date  Noted  . Primary osteoarthritis of left knee 07/18/2019  . Disequilibrium   . Syncope 03/21/2017  . Dizziness   . Fall   . Hemoptysis 03/18/2015  . Solitary pulmonary nodule 03/18/2015   Past Medical History:  Diagnosis Date  . AAA (abdominal aortic aneurysm) ([place])   . Arthritis    "knees" (03/21/2017)  . Borderline diabetes   . Bulging lumbar disc   . Depression   . Fibromyalgia   . GERD (gastroesophageal reflux disease)   . Glaucoma, both eyes   . High cholesterol    "don't have it but I take RX" (03/21/2017)  . Hypertension   . Syncope and collapse     Family History  Problem Relation Age of Onset  . Hypertension Father   . Stomach cancer Father   . Glaucoma Mother   . Diabetes Mother   . Diabetes Sister   . Diabetes Brother   . Lupus Child   . Diabetes Brother   . Lung cancer Brother   . Colon cancer Brother     Past Surgical History:  Procedure Laterality Date  .  ABDOMINAL AORTIC ANEURYSM REPAIR  1966  . APPENDECTOMY  1966  . VAGINAL HYSTERECTOMY     "partial"  . VIDEO BRONCHOSCOPY Bilateral 03/19/2015   Procedure: VIDEO BRONCHOSCOPY WITHOUT FLUORO;  Surgeon: Rigoberto Noel, MD;  Location: [facility];  Service: Cardiopulmonary;  Laterality: Bilateral;   Social History   Occupational History  . Occupation: retired    Comment: Charity fundraiser; dietician  Tobacco Use  . Smoking status: Never Smoker  . Smokeless tobacco: Never Used  Substance and Sexual Activity  . Alcohol use: No  . Drug use: No  . Sexual activity: Not on file

## 2019-07-31 NOTE — Patient Instructions (Signed)
Avoid bending, stooping and avoid lifting weights greater than 10 lbs. Avoid prolong standing and walking. Avoid frequent bending and stooping  No lifting greater than 10 lbs. May use ice or moist heat for pain. Weight loss is of benefit. Handicap license is approved. Dr. Romona Curls secretary/Assistant will call to arrange for epidural steroid injection  Left leg 5/16ths inch heel lift to assess for decreasing pain with improved leveling of the spine.

## 2019-08-09 DIAGNOSIS — R7301 Impaired fasting glucose: Secondary | ICD-10-CM | POA: Diagnosis not present

## 2019-08-09 DIAGNOSIS — I10 Essential (primary) hypertension: Secondary | ICD-10-CM | POA: Diagnosis not present

## 2019-08-09 DIAGNOSIS — F411 Generalized anxiety disorder: Secondary | ICD-10-CM | POA: Diagnosis not present

## 2019-08-09 DIAGNOSIS — E785 Hyperlipidemia, unspecified: Secondary | ICD-10-CM | POA: Diagnosis not present

## 2019-08-21 ENCOUNTER — Ambulatory Visit: Payer: Self-pay

## 2019-08-21 ENCOUNTER — Other Ambulatory Visit: Payer: Self-pay

## 2019-08-21 ENCOUNTER — Ambulatory Visit (INDEPENDENT_AMBULATORY_CARE_PROVIDER_SITE_OTHER): Payer: Medicare Other | Admitting: Physical Medicine and Rehabilitation

## 2019-08-21 ENCOUNTER — Encounter: Payer: Self-pay | Admitting: Physical Medicine and Rehabilitation

## 2019-08-21 VITALS — BP 141/84 | HR 65

## 2019-08-21 DIAGNOSIS — M5416 Radiculopathy, lumbar region: Secondary | ICD-10-CM

## 2019-08-21 MED ORDER — METHYLPREDNISOLONE ACETATE 80 MG/ML IJ SUSP
40.0000 mg | Freq: Once | INTRAMUSCULAR | Status: AC
Start: 2019-08-21 — End: 2019-08-21
  Administered 2019-08-21: 40 mg

## 2019-08-21 NOTE — Progress Notes (Signed)
Pt states pain in the lower back mostly on the left side that sometimes radiates into the left leg all the way down. Pt states last injection 03/28/19 helped with pain and lasted until a month ago. Increase in activities makes pain worse. Tylenol helps with pain.   .Numeric Pain Rating Scale and Functional Assessment Average Pain 6   In the last MONTH (on 0-10 scale) has pain interfered with the following?  1. General activity like being  able to carry out your everyday physical activities such as walking, climbing stairs, carrying groceries, or moving a chair?  Rating(7)   +Driver, -BT, -Dye Allergies.

## 2019-08-22 NOTE — Procedures (Signed)
Lumbar Epidural Steroid Injection - Interlaminar Approach with Fluoroscopic Guidance  Patient: Cassandra Brooks      Date of Birth: 1940-07-16 MRN: [id number] PCP: Lowella Dandy, NP      Visit Date: 08/21/2019   Universal Protocol:     Consent Given By: the patient  Position: PRONE  Additional Comments: Vital signs were monitored before and after the procedure. Patient was prepped and draped in the usual sterile fashion. The correct patient, procedure, and site was verified.   Injection Procedure Details:  Procedure Site One Meds Administered:  Meds ordered this encounter  Medications  . methylPREDNISolone acetate (DEPO-MEDROL) injection 40 mg     Laterality: Left  Location/Site:  L4-L5  Needle size: 20 G  Needle type: Tuohy  Needle Placement: Paramedian epidural  Findings:   -Comments: Excellent flow of contrast into the epidural space.  Procedure Details: Using a paramedian approach from the side mentioned above, the region overlying the inferior lamina was localized under fluoroscopic visualization and the soft tissues overlying this structure were infiltrated with 4 ml. of 1% Lidocaine without Epinephrine. The Tuohy needle was inserted into the epidural space using a paramedian approach.   The epidural space was localized using loss of resistance along with lateral and bi-planar fluoroscopic views.  After negative aspirate for air, blood, and CSF, a 2 ml. volume of Isovue-250 was injected into the epidural space and the flow of contrast was observed. Radiographs were obtained for documentation purposes.    The injectate was administered into the level noted above.   Additional Comments:  The patient tolerated the procedure well Dressing: 2 x 2 sterile gauze and Band-Aid    Post-procedure details: Patient was observed during the procedure. Post-procedure instructions were reviewed.  Patient left the clinic in stable condition.

## 2019-08-22 NOTE — Progress Notes (Signed)
Noble Subasic - 79 y.o. female MRN [id number]  Date of birth: [birth year]  Office Visit Note: Visit Date: 08/21/2019 PCP: Lowella Dandy, NP Referred by: Lowella Dandy, NP  Subjective: Chief Complaint  Patient presents with  . Lower Back - Pain  . Left Leg - Pain   HPI:  Cassandra Brooks is a 79 y.o. female who comes in today At the request of Dr. Basil Dess for planned Left L4-L5 lumbar epidural steroid injection with fluoroscopic guidance.  The patient has failed conservative care including home exercise, medications, time and activity modification.  This injection will be diagnostic and hopefully therapeutic.  Please see requesting physician notes for further details and justification.  Prior injection in December gave her quite a bit of relief more than 70% relief up until just recently over the last 6 weeks.  ROS Otherwise per HPI.  Assessment & Plan: Visit Diagnoses:  1. Lumbar radiculopathy     Plan: No additional findings.   Meds & Orders:  Meds ordered this encounter  Medications  . methylPREDNISolone acetate (DEPO-MEDROL) injection 40 mg    Orders Placed This Encounter  Procedures  . XR C-ARM NO REPORT  . Epidural Steroid injection    Follow-up: Return if symptoms worsen or fail to improve.   Procedures: No procedures performed  Lumbar Epidural Steroid Injection - Interlaminar Approach with Fluoroscopic Guidance  Patient: Cassandra Brooks      Date of Birth: 1940-09-06 MRN: [id number] PCP: Lowella Dandy, NP      Visit Date: 08/21/2019   Universal Protocol:     Consent Given By: the patient  Position: PRONE  Additional Comments: Vital signs were monitored before and after the procedure. Patient was prepped and draped in the usual sterile fashion. The correct patient, procedure, and site was verified.   Injection Procedure Details:  Procedure Site One Meds Administered:  Meds ordered this encounter  Medications  . methylPREDNISolone acetate  (DEPO-MEDROL) injection 40 mg     Laterality: Left  Location/Site:  L4-L5  Needle size: 20 G  Needle type: Tuohy  Needle Placement: Paramedian epidural  Findings:   -Comments: Excellent flow of contrast into the epidural space.  Procedure Details: Using a paramedian approach from the side mentioned above, the region overlying the inferior lamina was localized under fluoroscopic visualization and the soft tissues overlying this structure were infiltrated with 4 ml. of 1% Lidocaine without Epinephrine. The Tuohy needle was inserted into the epidural space using a paramedian approach.   The epidural space was localized using loss of resistance along with lateral and bi-planar fluoroscopic views.  After negative aspirate for air, blood, and CSF, a 2 ml. volume of Isovue-250 was injected into the epidural space and the flow of contrast was observed. Radiographs were obtained for documentation purposes.    The injectate was administered into the level noted above.   Additional Comments:  The patient tolerated the procedure well Dressing: 2 x 2 sterile gauze and Band-Aid    Post-procedure details: Patient was observed during the procedure. Post-procedure instructions were reviewed.  Patient left the clinic in stable condition.    Clinical History: MRI LUMBAR SPINE WITHOUT CONTRAST  TECHNIQUE: Multiplanar, multisequence MR imaging of the lumbar spine was performed. No intravenous contrast was administered.  COMPARISON: Lumbar radiographs 12/06/2016. CT Abdomen and Pelvis 08/26/2013.  FINDINGS: Segmentation: Normal, as demonstrated on the comparisons.  Alignment: Chronic straightening of lumbar lordosis has not significantly changed since 2015. No significant spondylolisthesis.  Vertebrae: Chronic degenerative  endplate marrow signal changes at L4-L5 and L5-S1. No marrow edema or evidence of acute osseous abnormality. Visualized bone marrow signal is within normal  limits. Intact visible sacrum and SI joints.  Conus medullaris: Extends to the L1-L2 level and appears normal.  Paraspinal and other soft tissues: Negative.  Disc levels:  T11-T12: Negative.  T12-L1: Negative.  L1-L2: Minor disc desiccation and facet hypertrophy. No stenosis.  L2-L3: Minor disc desiccation and circumferential disc bulge. Mild facet and ligament flavum hypertrophy. No stenosis.  L3-L4: Mild disc desiccation. Disc space loss with circumferential disc bulge and superimposed broad-based right subarticular disc extrusion (series 3, image 5 and series 6, image 18). Moderate to severe facet and ligament flavum hypertrophy. Moderate to severe right lateral recess stenosis (descending right L4 nerve root level). Mild spinal stenosis. No convincing left lateral recess stenosis. Mild bilateral L3 neural foraminal stenosis.  L4-L5: Severe chronic disc space loss and vacuum disc. Circumferential disc osteophyte complex with broad-based posterior component. Moderate to severe facet and ligament flavum hypertrophy. No significant spinal stenosis. No convincing lateral recess stenosis. Mild left L4 neural foraminal stenosis.  L5-S1: Severe chronic disc space loss and vacuum disc. Circumferential but mostly far lateral disc osteophyte complex. Moderate facet hypertrophy. No spinal or lateral recess stenosis. Mild to moderate L5 foraminal stenosis greater on the left.  IMPRESSION: 1. The symptomatic level is favored to be L3-L4 where a broad-based right foraminal disc extrusion contributes to moderate to severe right lateral recess stenosis and mild spinal stenosis. Query right L4 radiculitis. 2. Advanced chronic disc and endplate degeneration at L4-L5 and L5-S1 with mild left L4 and mild to moderate left greater than right L5 neural foraminal stenosis. 3. No acute osseous abnormality.   Electronically Signed By: Genevie Ann M.D. On: 12/27/2016 08:39     Objective:   VS:  HT:    WT:   BMI:     BP:(!) 141/84  HR:65bpm  TEMP: ( )  RESP:  Physical Exam Constitutional:      General: She is not in acute distress.    Appearance: Normal appearance. She is not ill-appearing.  HENT:     Head: Normocephalic and atraumatic.     Right Ear: External ear normal.     Left Ear: External ear normal.  Eyes:     Extraocular Movements: Extraocular movements intact.  Cardiovascular:     Rate and Rhythm: Normal rate.     Pulses: Normal pulses.  Musculoskeletal:     Right lower leg: No edema.     Left lower leg: No edema.     Comments: Patient has good distal strength with no pain over the greater trochanters.  No clonus or focal weakness.  Skin:    Findings: No erythema, lesion or rash.  Neurological:     General: No focal deficit present.     Mental Status: She is alert and oriented to person, place, and time.     Sensory: No sensory deficit.     Motor: No weakness or abnormal muscle tone.     Coordination: Coordination normal.  Psychiatric:        Mood and Affect: Mood normal.        Behavior: Behavior normal.      Imaging: XR C-ARM NO REPORT  Result Date: 08/21/2019 Please see Notes tab for imaging impression.

## 2019-08-23 ENCOUNTER — Other Ambulatory Visit: Payer: Self-pay

## 2019-08-23 ENCOUNTER — Emergency Department (HOSPITAL_COMMUNITY)
Admission: EM | Admit: 2019-08-23 | Discharge: 2019-08-24 | Disposition: A | Discharge status: Home / Self Care | Payer: Medicare Other | Attending: Emergency Medicine | Admitting: Emergency Medicine

## 2019-08-23 ENCOUNTER — Encounter (HOSPITAL_COMMUNITY): Payer: Self-pay

## 2019-08-23 ENCOUNTER — Emergency Department (HOSPITAL_COMMUNITY): Payer: Medicare Other

## 2019-08-23 DIAGNOSIS — I10 Essential (primary) hypertension: Secondary | ICD-10-CM | POA: Diagnosis not present

## 2019-08-23 DIAGNOSIS — R519 Headache, unspecified: Secondary | ICD-10-CM | POA: Insufficient documentation

## 2019-08-23 DIAGNOSIS — R079 Chest pain, unspecified: Secondary | ICD-10-CM

## 2019-08-23 DIAGNOSIS — R0789 Other chest pain: Secondary | ICD-10-CM | POA: Diagnosis not present

## 2019-08-23 LAB — BASIC METABOLIC PANEL
Anion gap: 15 (ref 5–15)
BUN: 30 mg/dL — ABNORMAL HIGH (ref 8–23)
CO2: 25 mmol/L (ref 22–32)
Calcium: 9.4 mg/dL (ref 8.9–10.3)
Chloride: 102 mmol/L (ref 98–111)
Creatinine, Ser: 1.12 mg/dL — ABNORMAL HIGH (ref 0.44–1.00)
GFR calc Af Amer: 54 mL/min — ABNORMAL LOW (ref >60)
GFR calc non Af Amer: 47 mL/min — ABNORMAL LOW (ref >60)
Glucose, Bld: 162 mg/dL — ABNORMAL HIGH (ref 70–99)
Potassium: 3.9 mmol/L (ref 3.5–5.1)
Sodium: 142 mmol/L (ref 135–145)

## 2019-08-23 LAB — CBC
HCT: 44.1 % (ref 36.0–46.0)
Hemoglobin: 14.4 g/dL (ref 12.0–15.0)
MCH: 34 pg (ref 26.0–34.0)
MCHC: 32.7 g/dL (ref 30.0–36.0)
MCV: 104 fL — ABNORMAL HIGH (ref 80.0–100.0)
Platelets: 199 10*3/uL (ref 150–400)
RBC: 4.24 MIL/uL (ref 3.87–5.11)
RDW: 12.7 % (ref 11.5–15.5)
WBC: 6.6 10*3/uL (ref 4.0–10.5)
nRBC: 0 % (ref 0.0–0.2)

## 2019-08-23 LAB — TROPONIN I (HIGH SENSITIVITY)
Troponin I (High Sensitivity): 7 ng/L (ref <18)
Troponin I (High Sensitivity): 7 ng/L (ref <18)

## 2019-08-23 MED ORDER — SODIUM CHLORIDE 0.9% FLUSH: 3.0000 mL | Freq: Once | INTRAVENOUS | Status: DC

## 2019-08-23 NOTE — ED Triage Notes (Signed)
Pt arrives to ED w/ c/o palpitations. States she has "irregular heart rhythm." Pt endorses chest soreness, denies SOB.

## 2019-08-24 ENCOUNTER — Emergency Department (HOSPITAL_COMMUNITY): Payer: Medicare Other

## 2019-08-24 ENCOUNTER — Other Ambulatory Visit: Payer: Self-pay

## 2019-08-24 DIAGNOSIS — R519 Headache, unspecified: Secondary | ICD-10-CM | POA: Diagnosis not present

## 2019-08-24 NOTE — Discharge Instructions (Addendum)
You were evaluated in the Emergency Department and after careful evaluation, we did not find any emergent condition requiring admission or further testing in the hospital. ° °Your exam/testing today was overall reassuring. ° °Please return to the Emergency Department if you experience any worsening of your condition.  We encourage you to follow up with a primary care provider.  Thank you for allowing us to be a part of your care. ° °

## 2019-08-24 NOTE — ED Notes (Signed)
Pt and caregiver verbalized understanding of d/c instructions and follow up care. Pt had no further questions and was transported via wheelchair to exit.

## 2019-08-24 NOTE — ED Provider Notes (Signed)
[facility] Emergency Department Provider Note MRN:  [id number]  Arrival date & time: 08/24/19     Chief Complaint   Palpitations   History of Present Illness   Cassandra Brooks is a 79 y.o. year-old female with a history of fibromyalgia, hypertension, AAA presenting to the ED with chief complaint of palpitations.  Patient has a number of complaints.  She is mostly here because she has been hearing her heartbeat in her ears for several weeks and this is concerned her.  She also endorses palpitations.  She has been having new headaches for the past 2 weeks.  Denies vision change, no neck pain, endorsing some "chest soreness" for the past 2 weeks.  Denies shortness of breath, no dizziness or diaphoresis, no nausea or vomiting, no shortness of breath, no diarrhea, no abdominal pain.  Review of Systems  A complete 10 system review of systems was obtained and all systems are negative except as noted in the HPI and PMH.   Patient's Health History    Past Medical History:  Diagnosis Date  . AAA (abdominal aortic aneurysm) ([place])   . Arthritis    "knees" (03/21/2017)  . Borderline diabetes   . Bulging lumbar disc   . Depression   . Fibromyalgia   . GERD (gastroesophageal reflux disease)   . Glaucoma, both eyes   . High cholesterol    "don't have it but I take RX" (03/21/2017)  . Hypertension   . Syncope and collapse     Past Surgical History:  Procedure Laterality Date  . ABDOMINAL AORTIC ANEURYSM REPAIR  1966  . APPENDECTOMY  1966  . VAGINAL HYSTERECTOMY     "partial"  . VIDEO BRONCHOSCOPY Bilateral 03/19/2015   Procedure: VIDEO BRONCHOSCOPY WITHOUT FLUORO;  Surgeon: Rigoberto Noel, MD;  Location: [facility];  Service: Cardiopulmonary;  Laterality: Bilateral;    Family History  Problem Relation Age of Onset  . Hypertension Father   . Stomach cancer Father   . Glaucoma Mother   . Diabetes Mother   . Diabetes Sister   . Diabetes Brother   . Lupus  Child   . Diabetes Brother   . Lung cancer Brother   . Colon cancer Brother     Social History   Socioeconomic History  . Marital status: Married    Spouse name: Not on file  . Number of children: Not on file  . Years of education: Not on file  . Highest education level: Not on file  Occupational History  . Occupation: retired    Comment: Charity fundraiser; dietician  Tobacco Use  . Smoking status: Never Smoker  . Smokeless tobacco: Never Used  Substance and Sexual Activity  . Alcohol use: No  . Drug use: No  . Sexual activity: Not on file  Other Topics Concern  . Not on file  Social History Narrative  . Not on file   Social Determinants of Health   Financial Resource Strain:   . Difficulty of Paying Living Expenses:   Food Insecurity:   . Worried About Charity fundraiser in the Last Year:   . Arboriculturist in the Last Year:   Transportation Needs:   . Film/video editor (Medical):   Marland Kitchen Lack of Transportation (Non-Medical):   Physical Activity:   . Days of Exercise per Week:   . Minutes of Exercise per Session:   Stress:   . Feeling of Stress :   Social Connections:   . Frequency  of Communication with Friends and Family:   . Frequency of Social Gatherings with Friends and Family:   . Attends Religious Services:   . Active Member of Clubs or Organizations:   . Attends Archivist Meetings:   Marland Kitchen Marital Status:   Intimate Partner Violence:   . Fear of Current or Ex-Partner:   . Emotionally Abused:   Marland Kitchen Physically Abused:   . Sexually Abused:      Physical Exam   Vitals:   08/24/19 0031 08/24/19 0247  BP: (!) 170/77 (!) 157/90  Pulse: 69 78  Resp: 18 18  Temp:    SpO2: 100% 96%    CONSTITUTIONAL: Chronically ill-appearing, NAD NEURO:  Alert and oriented x 3, no focal deficits EYES:  eyes equal and reactive ENT/NECK:  no LAD, no JVD CARDIO: Regular rate, well-perfused, normal S1 and S2 PULM:  CTAB no wheezing or rhonchi GI/GU:  normal bowel  sounds, non-distended, non-tender MSK/SPINE:  No gross deformities, no edema SKIN:  no rash, atraumatic PSYCH:  Appropriate speech and behavior  *Additional and/or pertinent findings included in MDM below  Diagnostic and Interventional Summary    EKG Interpretation  Date/Time:  Friday Aug 23 2019 17:29:43 EDT Ventricular Rate:  71 PR Interval:  124 QRS Duration: 66 QT Interval:  440 QTC Calculation: 478 R Axis:   72 Text Interpretation: Normal sinus rhythm Normal ECG Confirmed by Gerlene Fee [phone]) on 08/24/2019 5:07:12 AM      Labs Reviewed  BASIC METABOLIC PANEL - Abnormal; Notable for the following components:      Result Value   Glucose, Bld 162 (*)    BUN 30 (*)    Creatinine, Ser 1.12 (*)    GFR calc non Af Amer 47 (*)    GFR calc Af Amer 54 (*)    All other components within normal limits  CBC - Abnormal; Notable for the following components:   MCV 104.0 (*)    All other components within normal limits  TROPONIN I (HIGH SENSITIVITY)  TROPONIN I (HIGH SENSITIVITY)    CT Head Wo Contrast  Final Result    DG Chest 2 View  Final Result      Medications  sodium chloride flush (NS) 0.9 % injection 3 mL (3 mLs Intravenous Not Given 08/24/19 0453)     Procedures  /  Critical Care Procedures  ED Course and Medical Decision Making  I have reviewed the triage vital signs, the nursing notes, and pertinent available records from the EMR.  Listed above are laboratory and imaging tests that I personally ordered, reviewed, and interpreted and then considered in my medical decision making (see below for details).      Complaining of a myriad of chronic symptoms, given advanced age and new headaches, will obtain CT head.  Atypical and 2 weeks of chest pain with reassuring EKG and troponin, doubt cardiac etiology.  Anticipating discharge.  Work-up reassuring, appropriate for primary care follow-up.  Barth Kirks. Sedonia Small, [place] [email or web address]  Final Clinical Impressions(s) / ED Diagnoses     ICD-10-CM   1. Chest pain, unspecified type  R07.9   2. Bilateral headaches  R51.9     ED Discharge Orders    None       Discharge Instructions Discussed with and Provided to Patient:     Discharge Instructions     You were evaluated in the Emergency Department and after careful evaluation, we did not  find any emergent condition requiring admission or further testing in the hospital.  Your exam/testing today was overall reassuring.  Please return to the Emergency Department if you experience any worsening of your condition.  We encourage you to follow up with a primary care provider.  Thank you for allowing [place] to be a part of your care.        Maudie Flakes, MD 08/24/19 [phone]

## 2019-08-24 NOTE — ED Notes (Signed)
Pt transferred to ct

## 2019-08-26 DIAGNOSIS — R002 Palpitations: Secondary | ICD-10-CM | POA: Diagnosis not present

## 2019-08-26 DIAGNOSIS — R55 Syncope and collapse: Secondary | ICD-10-CM | POA: Diagnosis not present

## 2019-08-26 DIAGNOSIS — R42 Dizziness and giddiness: Secondary | ICD-10-CM | POA: Diagnosis not present

## 2019-08-26 DIAGNOSIS — R079 Chest pain, unspecified: Secondary | ICD-10-CM | POA: Diagnosis not present

## 2019-09-01 IMAGING — MR MR MRA NECK WO/W CM
13 of 20 series · 23 of 48 positions shown · IV contrast (multihance)
Comparison: None.

CLINICAL DATA: Initial evaluation for acute dizziness, syncope,
ataxia, stroke suspected.

EXAM:
MRI HEAD WITHOUT AND WITH CONTRAST
MRA HEAD WITHOUT CONTRAST
MRA NECK WITHOUT AND WITH CONTRAST
TECHNIQUE: Multiplanar, multiecho pulse sequences of the brain and surrounding
structures were obtained without intravenous contrast. Angiographic
images of the Circle of Willis were obtained using MRA technique
without intravenous contrast. Angiographic images of the neck were
obtained using MRA technique without and with intravenous contrast.
Carotid stenosis measurements (when applicable) are obtained
utilizing NASCET criteria, using the distal internal carotid
diameter as the denominator.
CONTRAST:  8mL MULTIHANCE GADOBENATE DIMEGLUMINE 529 MG/ML IV SOLN

[Series 3: DWI · axial · 3.0mm · 0.94mm/px · z∈[-107,+23]mm · 2 of 94 slices shown (1 of 2)]
[im 1/94]
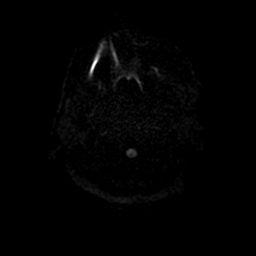
[im 94/94]
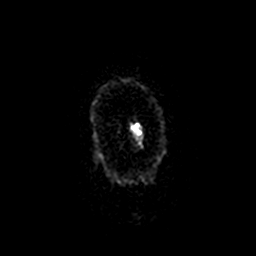

[Series 4: ax (id) 2 · axial · 1.0mm · 0.43mm/px · z∈[-104,-17]mm · 5 of 184 slices shown]
[im 1/184]
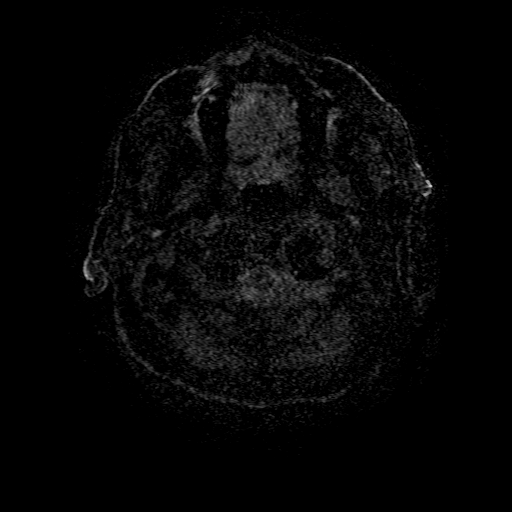
[im 46/184]
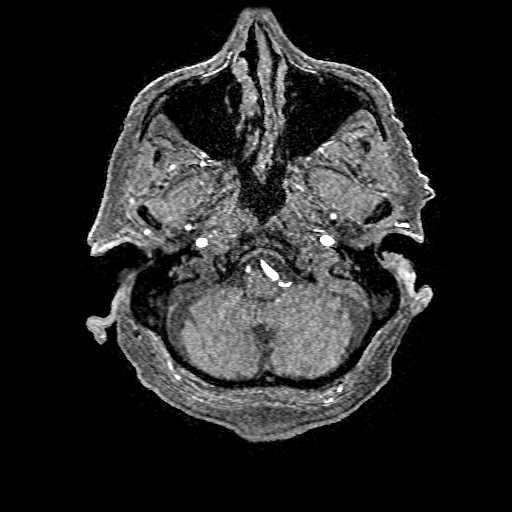
[im 92/184]
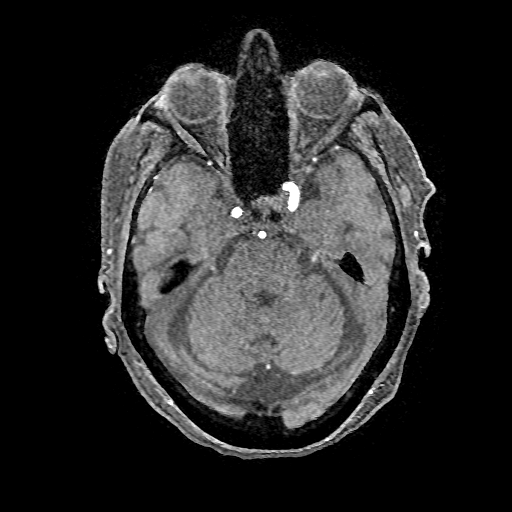
[im 138/184]
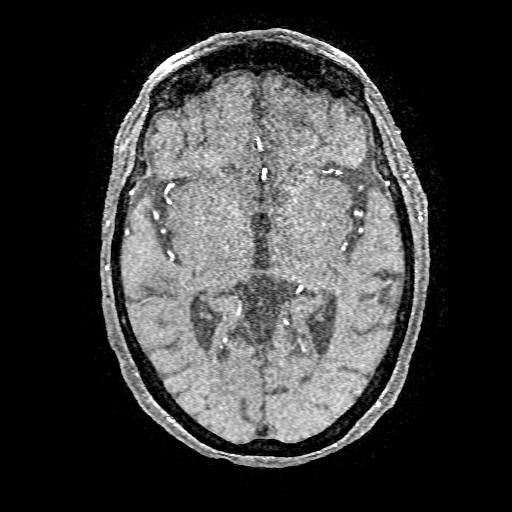
[im 184/184]
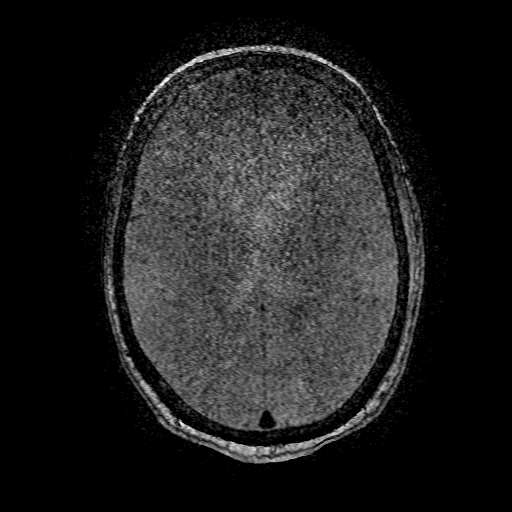

[Series 5: DWI · coronal · 4.0mm · 0.94mm/px · 2 of 64 slices shown (2 of 2)]
[im 1/64]
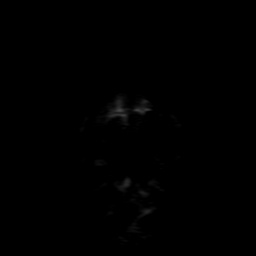
[im 64/64]
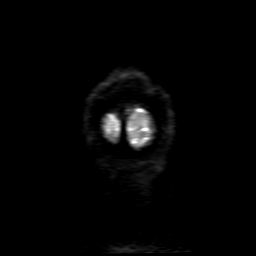

[Series 6: FLAIR · sagittal · 5.0mm · 0.47mm/px · 1 of 23 slices shown (1 of 2)]
[im 1/23]
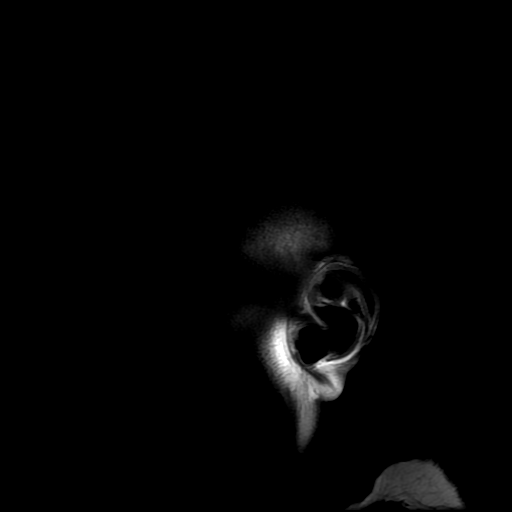

[Series 8: T2 · axial · 5.0mm · 0.47mm/px · 1 of 24 slices shown (1 of 2)]
[im 1/24]
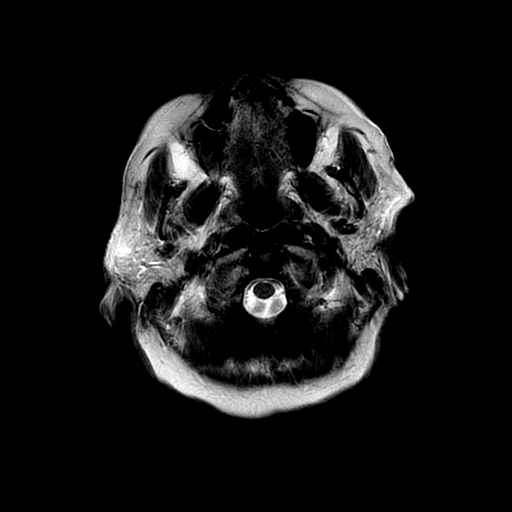

[Series 9: T2 · coronal · 5.0mm · 0.39mm/px · 1 of 27 slices shown (2 of 2)]
[im 1/27]
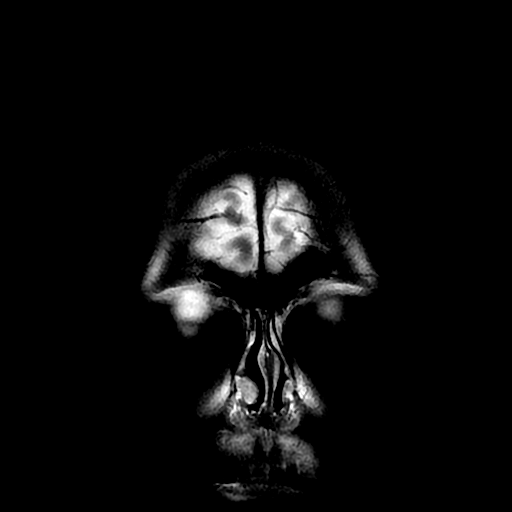

[Series 10: FLAIR · axial · 5.0mm · 0.47mm/px · 1 of 24 slices shown (2 of 2)]
[im 1/24]
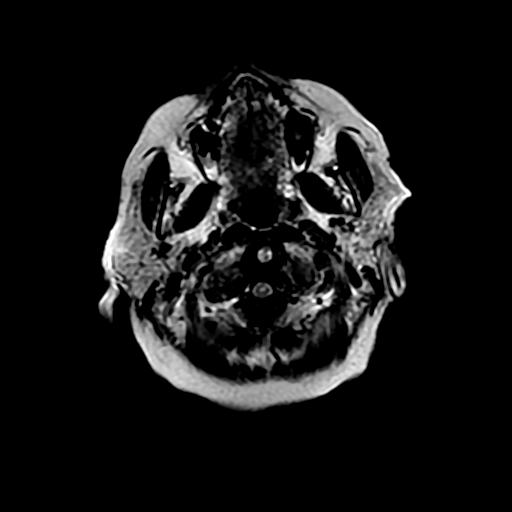

[Series 11: (person_name) · axial · 3.0mm · 0.47mm/px · z∈[-109,+26]mm · 3 of 96 slices shown]
[im 1/96]
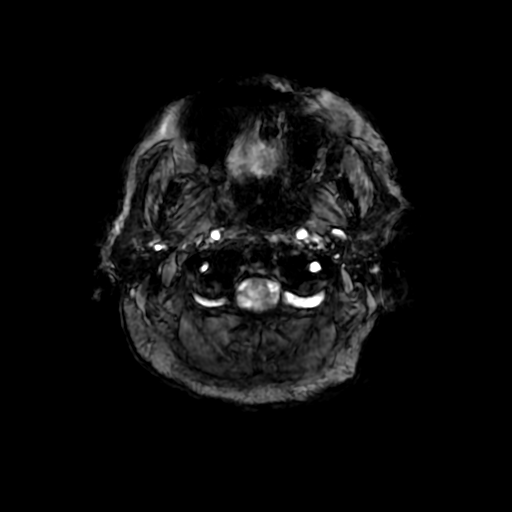
[im 48/96]
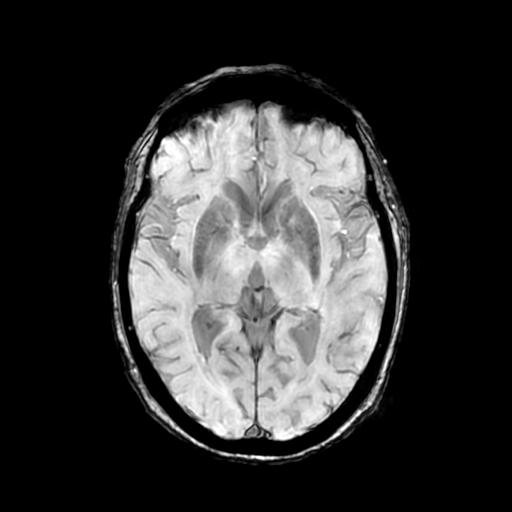
[im 96/96]
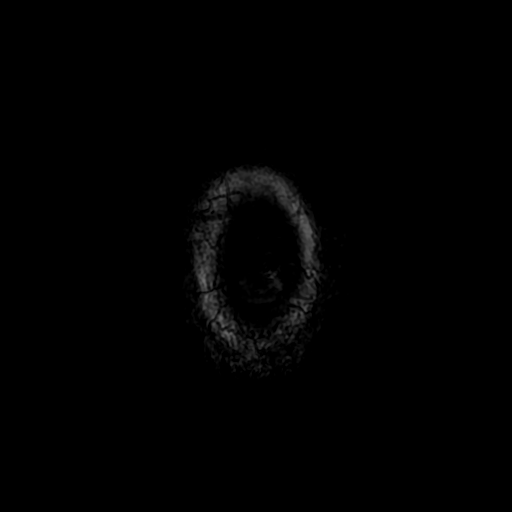

[Series 12: ax 3(person_name) · axial · 3.0mm · 0.94mm/px · z∈[-109,+25]mm · 2 of 48 slices shown]
[im 1/48]
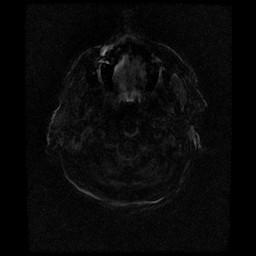
[im 48/48]
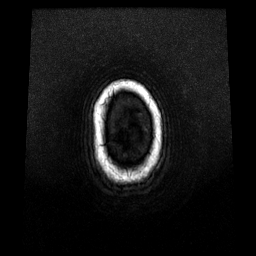

[Series 20: T2 post-contrast · coronal · 5.0mm · 0.39mm/px · 1 of 27 slices shown]
[im 1/27]
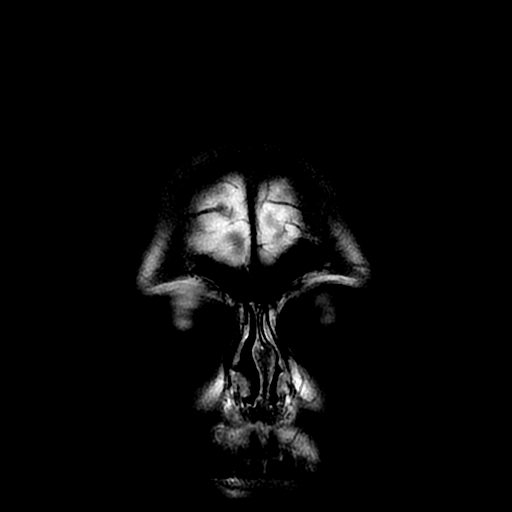

[Series 22: T1 · coronal · 5.0mm · 0.43mm/px · 1 of 29 slices shown]
[im 1/29]
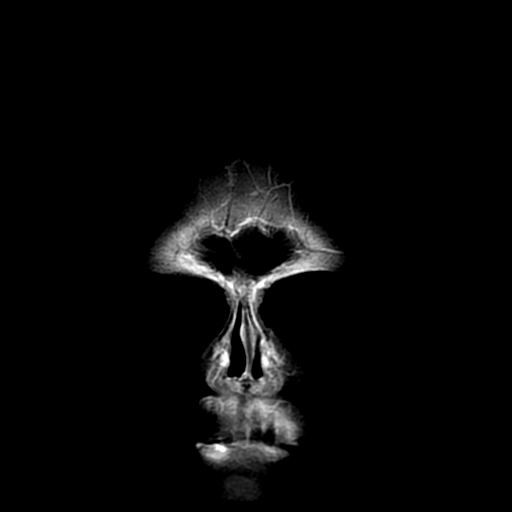

[Series 350: ADC · axial · 3.0mm · 0.94mm/px · z∈[-107,+23]mm · 2 of 47 slices shown (1 of 2)]
[im 1/47]
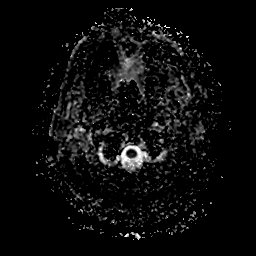
[im 47/47]
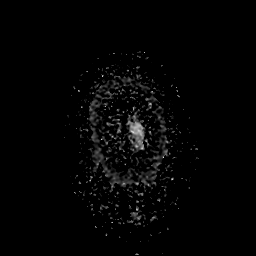

[Series 550: ADC · coronal · 4.0mm · 0.94mm/px · 1 of 32 slices shown (2 of 2)]
[im 1/32]
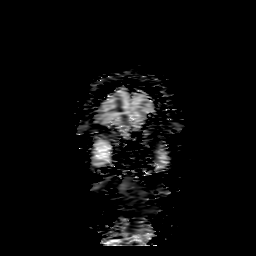

[23 of 48 positions shown; findings below may reference images not displayed]

FINDINGS: MRI HEAD FINDINGS

Mild diffuse prominence of the CSF containing spaces is compatible
with generalized age related cerebral atrophy. Patchy and confluent
T2/FLAIR hyperintensity within the periventricular and deep white
matter both cerebral hemispheres most consistent with chronic small
vessel ischemic disease, overall moderate nature. Chronic
microvascular ischemic changes present within the pons.

No abnormal foci of restricted diffusion to suggest acute or
subacute ischemia. Gray-white matter differentiation maintained. No
foci of susceptibility artifact to suggest acute or chronic
intracranial hemorrhage. No encephalomalacia to suggest remote
cortical infarction.

No mass lesion, midline shift or mass effect. No hydrocephalus. No
extra-axial fluid collection. Major dural sinuses are grossly
patent. No abnormal enhancement.

Pituitary gland suprasellar region normal. Midline structures intact
and normal.

Major intracranial vascular flow voids are maintained.

Craniocervical junction within normal limits. Upper cervical spine
unremarkable. Bone marrow signal intensity within normal limits. No
scalp soft tissue abnormality.

Globes and oval soft tissues within normal limits.

Paranasal sinuses are clear. No mastoid effusion. Inner ear
structures normal.

MRA HEAD FINDINGS

ANTERIOR CIRCULATION:

Distal cervical segments of the internal carotid arteries are widely
patent with antegrade flow. Petrous, cavernous, and supraclinoid
segments widely patent without stenosis. ICA termini widely patent.
A1 segments patent bilaterally. Right A1 segment hypoplastic, which
likely accounts for the slightly diminutive right ICA is compared to
the left. Normal anterior communicating artery. Anterior cerebral
arteries widely patent to their distal aspects without stenosis.

M1 segments patent without stenosis or occlusion. Normal MCA
bifurcations. No proximal M2 occlusion. Distal MCA branches well
perfused and symmetric.

POSTERIOR CIRCULATION:

Vertebral arteries patent to the vertebrobasilar junction without
stenosis. Left vertebral artery dominant. Posterior inferior
cerebral arteries patent proximally. Basilar artery widely patent to
its distal aspect. Superior cerebral arteries patent bilaterally.
Both of the posterior cerebral arteries supplied via the basilar and
are well perfused to their distal aspects without stenosis.

No aneurysm or vascular malformation.

MRA NECK FINDINGS

Source images reviewed.

Visualized aortic arch of normal caliber with normal branch pattern.
No flow-limiting stenosis about the origin of the great vessels.
Partially visualized subclavian artery is widely patent without
stenosis.

Right common and internal carotid arteries widely patent without
stenosis or occlusion. No significant atheromatous narrowing about
the right carotid bifurcation.

Left common and internal carotid artery is widely patent without
stenosis or occlusion. No significant atheromatous narrowing about
the left carotid bifurcation.

Both of the vertebral arteries arise from the subclavian arteries.
Left vertebral artery dominant. Right vertebral artery diffusely
diminutive/hypoplastic. Vertebral arteries patent within the neck
without stenosis or occlusion.
IMPRESSION: MRI HEAD IMPRESSION:

1. No acute intracranial infarct or other abnormality identified.
2. Generalized age-related cerebral atrophy with moderate chronic
small vessel ischemic disease.

MRA HEAD IMPRESSION:

Normal intracranial MRA. No evidence for large vessel occlusion. No
high-grade or correctable stenosis.

MRA NECK IMPRESSION:

Normal MRA of the neck. No high-grade or critical flow limiting
stenosis identified.

## 2019-09-03 ENCOUNTER — Ambulatory Visit: Payer: Medicare Other | Admitting: Cardiology

## 2019-10-02 ENCOUNTER — Ambulatory Visit: Payer: Medicare Other | Admitting: Specialist

## 2019-10-09 ENCOUNTER — Other Ambulatory Visit: Payer: Self-pay

## 2019-10-09 ENCOUNTER — Encounter: Payer: Self-pay | Admitting: Specialist

## 2019-10-09 ENCOUNTER — Ambulatory Visit (INDEPENDENT_AMBULATORY_CARE_PROVIDER_SITE_OTHER): Payer: Medicare Other | Admitting: Specialist

## 2019-10-09 VITALS — BP 158/76 | HR 72 | Ht 61.5 in | Wt 160.0 lb

## 2019-10-09 DIAGNOSIS — M47816 Spondylosis without myelopathy or radiculopathy, lumbar region: Secondary | ICD-10-CM

## 2019-10-09 DIAGNOSIS — M4807 Spinal stenosis, lumbosacral region: Secondary | ICD-10-CM | POA: Diagnosis not present

## 2019-10-09 DIAGNOSIS — M48062 Spinal stenosis, lumbar region with neurogenic claudication: Secondary | ICD-10-CM

## 2019-10-09 DIAGNOSIS — M2170 Unequal limb length (acquired), unspecified site: Secondary | ICD-10-CM | POA: Diagnosis not present

## 2019-10-09 DIAGNOSIS — M1712 Unilateral primary osteoarthritis, left knee: Secondary | ICD-10-CM

## 2019-10-09 MED ORDER — TRAMADOL-ACETAMINOPHEN 37.5-325 MG PO TABS: 1.0000 | ORAL_TABLET | Freq: Four times a day (QID) | ORAL | 0 refills | Status: DC | PRN

## 2019-10-09 NOTE — Patient Instructions (Signed)
Avoid bending, stooping and avoid lifting weights greater than 10 lbs. Avoid prolong standing and walking. Avoid frequent bending and stooping  No lifting greater than 10 lbs. May use ice or moist heat for pain. Weight loss is of benefit. Handicap license is approved. MRI lumbar  To assess for left lumbar pinched nerve due to spinal stenosis.

## 2019-10-09 NOTE — Progress Notes (Signed)
Office Visit Note   Patient: Cassandra Brooks           Date of Birth: 1940/12/14           MRN: [id number] Visit Date: 10/09/2019              Requested by: Lowella Dandy, NP [place],  [place] [id number] PCP: Lowella Dandy, NP   Assessment & Plan: Visit Diagnoses:  1. Spinal stenosis of lumbar region with neurogenic claudication   2. Leg length discrepancy   3. Primary osteoarthritis of left knee   4. Spondylosis without myelopathy or radiculopathy, lumbar region   5. Spinal stenosis of lumbosacral region     Plan: Avoid bending, stooping and avoid lifting weights greater than 10 lbs. Avoid prolong standing and walking. Avoid frequent bending and stooping  No lifting greater than 10 lbs. May use ice or moist heat for pain. Weight loss is of benefit. Handicap license is approved. MRI lumbar  To assess for left lumbar pinched nerve due to spinal stenosis.  Follow-Up Instructions: Return in about 4 weeks (around 11/06/2019).   Orders:  No orders of the defined types were placed in this encounter.  No orders of the defined types were placed in this encounter.     Procedures: No procedures performed   Clinical Data: No additional findings.   Subjective: Chief Complaint  Patient presents with  . Lower Back - Follow-up    HPI  Review of Systems   Objective: Vital Signs: BP (!) 158/76 (BP Location: Left Arm, Patient Position: Sitting)   Pulse 72   Ht 5' 1.5" (1.562 m)   Wt 160 lb (72.6 kg)   BMI 29.74 kg/m   Physical Exam  Ortho Exam  Specialty Comments:  No specialty comments available.  Imaging: No results found.   PMFS History: Patient Active Problem List   Diagnosis Date Noted  . Primary osteoarthritis of left knee 07/18/2019  . Disequilibrium   . Syncope 03/21/2017  . Dizziness   . Fall   . Hemoptysis 03/18/2015  . Solitary pulmonary nodule 03/18/2015   Past Medical History:  Diagnosis Date  . AAA (abdominal  aortic aneurysm) ([place])   . Arthritis    "knees" (03/21/2017)  . Borderline diabetes   . Bulging lumbar disc   . Depression   . Fibromyalgia   . GERD (gastroesophageal reflux disease)   . Glaucoma, both eyes   . High cholesterol    "don't have it but I take RX" (03/21/2017)  . Hypertension   . Syncope and collapse     Family History  Problem Relation Age of Onset  . Hypertension Father   . Stomach cancer Father   . Glaucoma Mother   . Diabetes Mother   . Diabetes Sister   . Diabetes Brother   . Lupus Child   . Diabetes Brother   . Lung cancer Brother   . Colon cancer Brother     Past Surgical History:  Procedure Laterality Date  . ABDOMINAL AORTIC ANEURYSM REPAIR  1966  . APPENDECTOMY  1966  . VAGINAL HYSTERECTOMY     "partial"  . VIDEO BRONCHOSCOPY Bilateral 03/19/2015   Procedure: VIDEO BRONCHOSCOPY WITHOUT FLUORO;  Surgeon: Rigoberto Noel, MD;  Location: [facility];  Service: Cardiopulmonary;  Laterality: Bilateral;   Social History   Occupational History  . Occupation: retired    Comment: Charity fundraiser; dietician  Tobacco Use  . Smoking status: Never Smoker  .  Smokeless tobacco: Never Used  Vaping Use  . Vaping Use: Never used  Substance and Sexual Activity  . Alcohol use: No  . Drug use: No  . Sexual activity: Not on file

## 2019-10-10 DIAGNOSIS — Z6832 Body mass index (BMI) 32.0-32.9, adult: Secondary | ICD-10-CM | POA: Diagnosis not present

## 2019-10-10 DIAGNOSIS — Z1331 Encounter for screening for depression: Secondary | ICD-10-CM | POA: Diagnosis not present

## 2019-10-10 DIAGNOSIS — Z0000 Encounter for general adult medical examination without abnormal findings: Secondary | ICD-10-CM | POA: Diagnosis not present

## 2019-10-10 DIAGNOSIS — E785 Hyperlipidemia, unspecified: Secondary | ICD-10-CM | POA: Diagnosis not present

## 2019-10-10 DIAGNOSIS — Z9181 History of falling: Secondary | ICD-10-CM | POA: Diagnosis not present

## 2019-10-24 DIAGNOSIS — H52203 Unspecified astigmatism, bilateral: Secondary | ICD-10-CM | POA: Diagnosis not present

## 2019-10-24 DIAGNOSIS — H401133 Primary open-angle glaucoma, bilateral, severe stage: Secondary | ICD-10-CM | POA: Diagnosis not present

## 2019-10-24 DIAGNOSIS — H3500 Unspecified background retinopathy: Secondary | ICD-10-CM | POA: Diagnosis not present

## 2019-10-24 DIAGNOSIS — H04123 Dry eye syndrome of bilateral lacrimal glands: Secondary | ICD-10-CM | POA: Diagnosis not present

## 2019-11-11 ENCOUNTER — Other Ambulatory Visit: Payer: Self-pay

## 2019-11-11 ENCOUNTER — Ambulatory Visit
Admission: RE | Admit: 2019-11-11 | Discharge: 2019-11-11 | Disposition: A | Discharge status: Home / Self Care | Payer: Medicare Other | Source: Ambulatory Visit | Attending: Specialist | Admitting: Specialist

## 2019-11-11 DIAGNOSIS — M48061 Spinal stenosis, lumbar region without neurogenic claudication: Secondary | ICD-10-CM | POA: Diagnosis not present

## 2019-11-11 DIAGNOSIS — M4807 Spinal stenosis, lumbosacral region: Secondary | ICD-10-CM

## 2019-11-14 ENCOUNTER — Ambulatory Visit (INDEPENDENT_AMBULATORY_CARE_PROVIDER_SITE_OTHER): Payer: Medicare Other | Admitting: Specialist

## 2019-11-14 ENCOUNTER — Other Ambulatory Visit: Payer: Self-pay

## 2019-11-14 ENCOUNTER — Encounter: Payer: Self-pay | Admitting: Specialist

## 2019-11-14 ENCOUNTER — Telehealth: Payer: Self-pay | Admitting: Specialist

## 2019-11-14 VITALS — BP 160/76 | HR 70 | Ht 61.5 in | Wt 160.0 lb

## 2019-11-14 DIAGNOSIS — M47816 Spondylosis without myelopathy or radiculopathy, lumbar region: Secondary | ICD-10-CM

## 2019-11-14 DIAGNOSIS — M4807 Spinal stenosis, lumbosacral region: Secondary | ICD-10-CM

## 2019-11-14 DIAGNOSIS — M48062 Spinal stenosis, lumbar region with neurogenic claudication: Secondary | ICD-10-CM

## 2019-11-14 DIAGNOSIS — M2170 Unequal limb length (acquired), unspecified site: Secondary | ICD-10-CM | POA: Diagnosis not present

## 2019-11-14 DIAGNOSIS — M1712 Unilateral primary osteoarthritis, left knee: Secondary | ICD-10-CM

## 2019-11-14 NOTE — Progress Notes (Signed)
Office Visit Note   Patient: Cassandra Brooks           Date of Birth: [birth year]           MRN: [id number] Visit Date: 11/14/2019              Requested by: Lowella Dandy, NP [place],  [place] [id number] PCP: Lowella Dandy, NP   Assessment & Plan: Visit Diagnoses:  1. Primary osteoarthritis of left knee   2. Spinal stenosis of lumbar region with neurogenic claudication   3. Leg length discrepancy   4. Spinal stenosis of lumbosacral region   5. Spondylosis without myelopathy or radiculopathy, lumbar region     Plan: Avoid bending, stooping and avoid lifting weights greater than 10 lbs. Avoid prolong standing and walking. Avoid frequent bending and stooping  No lifting greater than 10 lbs. May use ice or moist heat for pain. Weight loss is of benefit. Handicap license is approved. Dr. Romona Curls secretary/Assistant will call to arrange for epidural steroid injection  Dr.Xu appt in 2 weks for evaluation for left knee replacement. She has spinal stenosis left L3-4 and left L5 foramenal stenosis both are mild to moderate and I recommend ESIs. She may  Eventually require decompression at both levels but ESI may be adequate.   Follow-Up Instructions: Return in about 3 weeks (around 12/05/2019) for Appt with Dr. Erlinda Hong in 2 weeks to consider a left total knee replacement. .   Orders:  No orders of the defined types were placed in this encounter.  No orders of the defined types were placed in this encounter.     Procedures: No procedures performed   Clinical Data: Findings:  Narrative & Impression CLINICAL DATA:  Spinal stenosis, lumbar radiculopathy  EXAM: MRI LUMBAR SPINE WITHOUT CONTRAST  TECHNIQUE: Multiplanar, multisequence MR imaging of the lumbar spine was performed. No intravenous contrast was administered.  COMPARISON:  2018  FINDINGS: Segmentation:  Standard.  Alignment:  Similar mild retrolisthesis at L2-L3.  Vertebrae: Similar  vertebral body heights with multilevel degenerative endplate irregularity. Minor degenerative endplate marrow edema at L3-L4 and L4-L5. No suspicious osseous lesion.  Conus medullaris and cauda equina: Conus extends to the L1-L2 level. Conus and cauda equina appear normal.  Paraspinal and other soft tissues: Unremarkable.  Disc levels:  L1-L2:  No canal or foraminal stenosis.  L2-L3: Disc bulge and facet arthropathy with ligamentum flavum infolding. No canal stenosis. Minor foraminal stenosis.  L3-L4: Disc bulge with prominent foraminal components and facet arthropathy with joint effusion and ligamentum flavum infolding. Increased moderate canal stenosis. Persistent narrowing of the lateral recesses. Similar mild foraminal stenosis.  L4-L5: Disc bulge with endplate osteophytic ridging and left greater than right facet arthropathy with ligamentum flavum infolding. Similar minor canal stenosis. Slight effacement of the lateral recesses. Increased moderate foraminal stenosis.  L5-S1: Disc bulge with endplate osteophytic ridging and facet arthropathy. No canal stenosis. Similar mild right and increased moderate left foraminal stenosis.  IMPRESSION: Multilevel degenerative changes as detailed above with some progression since 2018.   Electronically Signed   By: Macy Mis M.D.   On: 11/11/2019 08:33       Subjective: Chief Complaint  Patient presents with  . Lower Back - Follow-up, Pain    MRI Review Lumbar spine    79 year old female with history of back pain and radiation into the legs left greater than right with history of left knee osteoarthritis. She has pain with  standing and walking and has had injection of the spine and the left knee. She reports the left knee swelling improved with injection and the injection in the spine helped the back pain.   Review of Systems   Objective: Vital Signs: BP (!) 160/76 (BP Location: Left Arm, Patient  Position: Sitting)   Pulse 70   Ht 5' 1.5" (1.562 m)   Wt 160 lb (72.6 kg)   BMI 29.74 kg/m   Physical Exam  Ortho Exam  Specialty Comments:  No specialty comments available.  Imaging: No results found.   PMFS History: Patient Active Problem List   Diagnosis Date Noted  . Primary osteoarthritis of left knee 07/18/2019  . Disequilibrium   . Syncope 03/21/2017  . Dizziness   . Fall   . Hemoptysis 03/18/2015  . Solitary pulmonary nodule 03/18/2015   Past Medical History:  Diagnosis Date  . AAA (abdominal aortic aneurysm) ([place])   . Arthritis    "knees" (03/21/2017)  . Borderline diabetes   . Bulging lumbar disc   . Depression   . Fibromyalgia   . GERD (gastroesophageal reflux disease)   . Glaucoma, both eyes   . High cholesterol    "don't have it but I take RX" (03/21/2017)  . Hypertension   . Syncope and collapse     Family History  Problem Relation Age of Onset  . Hypertension Father   . Stomach cancer Father   . Glaucoma Mother   . Diabetes Mother   . Diabetes Sister   . Diabetes Brother   . Lupus Child   . Diabetes Brother   . Lung cancer Brother   . Colon cancer Brother     Past Surgical History:  Procedure Laterality Date  . ABDOMINAL AORTIC ANEURYSM REPAIR  1966  . APPENDECTOMY  1966  . VAGINAL HYSTERECTOMY     "partial"  . VIDEO BRONCHOSCOPY Bilateral 03/19/2015   Procedure: VIDEO BRONCHOSCOPY WITHOUT FLUORO;  Surgeon: Rigoberto Noel, MD;  Location: [facility];  Service: Cardiopulmonary;  Laterality: Bilateral;   Social History   Occupational History  . Occupation: retired    Comment: Charity fundraiser; dietician  Tobacco Use  . Smoking status: Never Smoker  . Smokeless tobacco: Never Used  Vaping Use  . Vaping Use: Never used  Substance and Sexual Activity  . Alcohol use: No  . Drug use: No  . Sexual activity: Not on file

## 2019-11-14 NOTE — Telephone Encounter (Signed)
Patient stopped by before leaving.   She and her daughter said they were both told to return in three weeks. There is no availability so I told them we would call after I got directions for her provider.   Call back: [name] Scotton [phone]

## 2019-11-14 NOTE — Patient Instructions (Signed)
Avoid bending, stooping and avoid lifting weights greater than 10 lbs. Avoid prolong standing and walking. Avoid frequent bending and stooping  No lifting greater than 10 lbs. May use ice or moist heat for pain. Weight loss is of benefit. Handicap license is approved. Dr. Romona Curls secretary/Assistant will call to arrange for epidural steroid injection  Dr.Xu appt in 2 weks for evaluation for left knee replacement. She has spinal stenosis left L3-4 and left L5 foramenal stenosis both are mild to moderate and I recommend ESIs. She may  Eventually require decompression at both levels but ESI may be adequate.

## 2019-11-14 NOTE — Telephone Encounter (Signed)
Please schedule for first available

## 2019-11-22 ENCOUNTER — Ambulatory Visit (INDEPENDENT_AMBULATORY_CARE_PROVIDER_SITE_OTHER): Payer: Medicare Other | Admitting: Orthopaedic Surgery

## 2019-11-22 ENCOUNTER — Encounter: Payer: Self-pay | Admitting: Orthopaedic Surgery

## 2019-11-22 ENCOUNTER — Other Ambulatory Visit: Payer: Self-pay

## 2019-11-22 VITALS — Ht 61.5 in | Wt 165.0 lb

## 2019-11-22 DIAGNOSIS — M1712 Unilateral primary osteoarthritis, left knee: Secondary | ICD-10-CM

## 2019-11-22 NOTE — Progress Notes (Signed)
Office Visit Note   Patient: Cassandra Brooks           Date of Birth: June 23, 1940           MRN: [id number] Visit Date: 11/22/2019              Requested by: Lowella Dandy, NP [place],  [place] [id number] PCP: Lowella Dandy, NP   Assessment & Plan: Visit Diagnoses:  1. Primary osteoarthritis of left knee     Plan: My impression is end-stage left knee DJD valgus deformity.  Based on our discussion of her options she has elected to proceed with a total knee replacement.  Risk benefits rehab recovery reviewed in detail today.  We will obtain preoperative medical clearance from her PCP who she is seen in a couple weeks.  We look forward to treating her in the operating room in the near future.  Follow-Up Instructions: No follow-ups on file.   Orders:  No orders of the defined types were placed in this encounter.  No orders of the defined types were placed in this encounter.     Procedures: No procedures performed   Clinical Data: No additional findings.   Subjective: Chief Complaint  Patient presents with  . Left Knee - Pain    Cassandra Brooks is a very pleasant 79 year old gentleman who comes in for continued left knee pain due to DJD.  I saw her about 79 months ago and provided her with a cortisone injection which gave her temporary and partial relief.  She continues to have difficulty with constant severe pain that is limiting her ADLs and at night.  She has swelling and feelings of weakness and giving way that has caused her to have almost fallen a couple times especially when she comes down the stairs.  She feels very unsure of the knee.   Review of Systems  Constitutional: Negative.   HENT: Negative.   Eyes: Negative.   Respiratory: Negative.   Cardiovascular: Negative.   Endocrine: Negative.   Musculoskeletal: Negative.   Neurological: Negative.   Hematological: Negative.   Psychiatric/Behavioral: Negative.   All other systems reviewed and are  negative.    Objective: Vital Signs: Ht 5' 1.5" (1.562 m)   Wt 165 lb (74.8 kg)   BMI 30.67 kg/m   Physical Exam Vitals and nursing note reviewed.  Constitutional:      Appearance: She is well-developed.  Pulmonary:     Effort: Pulmonary effort is normal.  Skin:    General: Skin is warm.     Capillary Refill: Capillary refill takes less than 2 seconds.  Neurological:     Mental Status: She is alert and oriented to person, place, and time.  Psychiatric:        Behavior: Behavior normal.        Thought Content: Thought content normal.        Judgment: Judgment normal.     Ortho Exam Left knee shows a valgus deformity.  10 degrees of flexion contracture.  Crepitus and pain with range of motion which is moderately limited. Specialty Comments:  No specialty comments available.  Imaging: No results found.   PMFS History: Patient Active Problem List   Diagnosis Date Noted  . Primary osteoarthritis of left knee 07/18/2019  . Disequilibrium   . Syncope 03/21/2017  . Dizziness   . Fall   . Hemoptysis 03/18/2015  . Solitary pulmonary nodule 03/18/2015   Past Medical History:  Diagnosis  Date  . AAA (abdominal aortic aneurysm) ([place])   . Arthritis    "knees" (03/21/2017)  . Borderline diabetes   . Bulging lumbar disc   . Depression   . Fibromyalgia   . GERD (gastroesophageal reflux disease)   . Glaucoma, both eyes   . High cholesterol    "don't have it but I take RX" (03/21/2017)  . Hypertension   . Syncope and collapse     Family History  Problem Relation Age of Onset  . Hypertension Father   . Stomach cancer Father   . Glaucoma Mother   . Diabetes Mother   . Diabetes Sister   . Diabetes Brother   . Lupus Child   . Diabetes Brother   . Lung cancer Brother   . Colon cancer Brother     Past Surgical History:  Procedure Laterality Date  . ABDOMINAL AORTIC ANEURYSM REPAIR  1966  . APPENDECTOMY  1966  . VAGINAL HYSTERECTOMY     "partial"  . VIDEO  BRONCHOSCOPY Bilateral 03/19/2015   Procedure: VIDEO BRONCHOSCOPY WITHOUT FLUORO;  Surgeon: Rigoberto Noel, MD;  Location: [facility];  Service: Cardiopulmonary;  Laterality: Bilateral;   Social History   Occupational History  . Occupation: retired    Comment: Charity fundraiser; dietician  Tobacco Use  . Smoking status: Never Smoker  . Smokeless tobacco: Never Used  Vaping Use  . Vaping Use: Never used  Substance and Sexual Activity  . Alcohol use: No  . Drug use: No  . Sexual activity: Not on file

## 2019-12-02 ENCOUNTER — Other Ambulatory Visit: Payer: Self-pay

## 2019-12-02 ENCOUNTER — Ambulatory Visit: Payer: Self-pay

## 2019-12-02 ENCOUNTER — Encounter: Payer: Self-pay | Admitting: Physical Medicine and Rehabilitation

## 2019-12-02 ENCOUNTER — Ambulatory Visit (INDEPENDENT_AMBULATORY_CARE_PROVIDER_SITE_OTHER): Payer: Medicare Other | Admitting: Physical Medicine and Rehabilitation

## 2019-12-02 VITALS — BP 138/74 | HR 71

## 2019-12-02 DIAGNOSIS — M5416 Radiculopathy, lumbar region: Secondary | ICD-10-CM | POA: Diagnosis not present

## 2019-12-02 MED ORDER — METHYLPREDNISOLONE ACETATE 80 MG/ML IJ SUSP
80.0000 mg | Freq: Once | INTRAMUSCULAR | Status: AC
Start: 2019-12-02 — End: 2019-12-02
  Administered 2019-12-02: 80 mg

## 2019-12-02 NOTE — Progress Notes (Signed)
Cassandra Brooks - 79 y.o. female MRN [id number]  Date of birth: 11-May-1940  Office Visit Note: Visit Date: 12/02/2019 PCP: Lowella Dandy, NP Referred by: Lowella Dandy, NP  Subjective: Chief Complaint  Patient presents with  . Lower Back - Pain   HPI:  Cassandra Brooks is a 79 y.o. female who comes in today at the request of Dr. Basil Dess for planned Left L3-L4 and L5-S1 Lumbar epidural steroid injection with fluoroscopic guidance.  The patient has failed conservative care including home exercise, medications, time and activity modification.  This injection will be diagnostic and hopefully therapeutic.  Please see requesting physician notes for further details and justification.   ROS Otherwise per HPI.  Assessment & Plan: Visit Diagnoses:  1. Lumbar radiculopathy     Plan: No additional findings.   Meds & Orders:  Meds ordered this encounter  Medications  . methylPREDNISolone acetate (DEPO-MEDROL) injection 80 mg    Orders Placed This Encounter  Procedures  . XR C-ARM NO REPORT  . Epidural Steroid injection    Follow-up: Return for visit to requesting physician as needed.   Procedures: No procedures performed  Lumbosacral Transforaminal Epidural Steroid Injection - Sub-Pedicular Approach with Fluoroscopic Guidance  Patient: Cassandra Brooks      Date of Birth: [birth year] MRN: [id number] PCP: Lowella Dandy, NP      Visit Date: 12/02/2019   Universal Protocol:    Date/Time: 12/02/2019  Consent Given By: the patient  Position: PRONE  Additional Comments: Vital signs were monitored before and after the procedure. Patient was prepped and draped in the usual sterile fashion. The correct patient, procedure, and site was verified.   Injection Procedure Details:  Procedure Site One Meds Administered:  Meds ordered this encounter  Medications  . methylPREDNISolone acetate (DEPO-MEDROL) injection 80 mg    Laterality: Left  Location/Site:  L3-L4 L5-S1  Needle  size: 22 G  Needle type: Spinal  Needle Placement: Transforaminal  Findings:    -Comments: Excellent flow of contrast along the nerve, nerve root and into the epidural space.  Procedure Details: After squaring off the end-plates to get a true AP view, the C-arm was positioned so that an oblique view of the foramen as noted above was visualized. The target area is just inferior to the "nose of the scotty dog" or sub pedicular. The soft tissues overlying this structure were infiltrated with 2-3 ml. of 1% Lidocaine without Epinephrine.  The spinal needle was inserted toward the target using a "trajectory" view along the fluoroscope beam.  Under AP and lateral visualization, the needle was advanced so it did not puncture dura and was located close the 6 O'Clock position of the pedical in AP tracterory. Biplanar projections were used to confirm position. Aspiration was confirmed to be negative for CSF and/or blood. A 1-2 ml. volume of Isovue-250 was injected and flow of contrast was noted at each level. Radiographs were obtained for documentation purposes.   After attaining the desired flow of contrast documented above, a 0.5 to 1.0 ml test dose of 0.25% Marcaine was injected into each respective transforaminal space.  The patient was observed for 90 seconds post injection.  After no sensory deficits were reported, and normal lower extremity motor function was noted,   the above injectate was administered so that equal amounts of the injectate were placed at each foramen (level) into the transforaminal epidural space.   Additional Comments:  The patient tolerated the procedure well Dressing: 2 x 2 sterile  gauze and Band-Aid    Post-procedure details: Patient was observed during the procedure. Post-procedure instructions were reviewed.  Patient left the clinic in stable condition.      Clinical History: MRI LUMBAR SPINE WITHOUT CONTRAST  TECHNIQUE: Multiplanar, multisequence MR imaging  of the lumbar spine was performed. No intravenous contrast was administered.  COMPARISON: Lumbar radiographs 12/06/2016. CT Abdomen and Pelvis 08/26/2013.  FINDINGS: Segmentation: Normal, as demonstrated on the comparisons.  Alignment: Chronic straightening of lumbar lordosis has not significantly changed since 2015. No significant spondylolisthesis.  Vertebrae: Chronic degenerative endplate marrow signal changes at L4-L5 and L5-S1. No marrow edema or evidence of acute osseous abnormality. Visualized bone marrow signal is within normal limits. Intact visible sacrum and SI joints.  Conus medullaris: Extends to the L1-L2 level and appears normal.  Paraspinal and other soft tissues: Negative.  Disc levels:  T11-T12: Negative.  T12-L1: Negative.  L1-L2: Minor disc desiccation and facet hypertrophy. No stenosis.  L2-L3: Minor disc desiccation and circumferential disc bulge. Mild facet and ligament flavum hypertrophy. No stenosis.  L3-L4: Mild disc desiccation. Disc space loss with circumferential disc bulge and superimposed broad-based right subarticular disc extrusion (series 3, image 5 and series 6, image 18). Moderate to severe facet and ligament flavum hypertrophy. Moderate to severe right lateral recess stenosis (descending right L4 nerve root level). Mild spinal stenosis. No convincing left lateral recess stenosis. Mild bilateral L3 neural foraminal stenosis.  L4-L5: Severe chronic disc space loss and vacuum disc. Circumferential disc osteophyte complex with broad-based posterior component. Moderate to severe facet and ligament flavum hypertrophy. No significant spinal stenosis. No convincing lateral recess stenosis. Mild left L4 neural foraminal stenosis.  L5-S1: Severe chronic disc space loss and vacuum disc. Circumferential but mostly far lateral disc osteophyte complex. Moderate facet hypertrophy. No spinal or lateral recess stenosis. Mild to moderate L5  foraminal stenosis greater on the left.  IMPRESSION: 1. The symptomatic level is favored to be L3-L4 where a broad-based right foraminal disc extrusion contributes to moderate to severe right lateral recess stenosis and mild spinal stenosis. Query right L4 radiculitis. 2. Advanced chronic disc and endplate degeneration at L4-L5 and L5-S1 with mild left L4 and mild to moderate left greater than right L5 neural foraminal stenosis. 3. No acute osseous abnormality.   Electronically Signed By: Genevie Ann M.D. On: 12/27/2016 08:39     Objective:  VS:  HT:    WT:   BMI:     BP:138/74  HR:71bpm  TEMP: ( )  RESP:  Physical Exam Constitutional:      General: She is not in acute distress.    Appearance: Normal appearance. She is not ill-appearing.  HENT:     Head: Normocephalic and atraumatic.     Right Ear: External ear normal.     Left Ear: External ear normal.  Eyes:     Extraocular Movements: Extraocular movements intact.  Cardiovascular:     Rate and Rhythm: Normal rate.     Pulses: Normal pulses.  Musculoskeletal:     Right lower leg: No edema.     Left lower leg: No edema.     Comments: Patient has good distal strength with no pain over the greater trochanters.  No clonus or focal weakness.  Skin:    Findings: No erythema, lesion or rash.  Neurological:     General: No focal deficit present.     Mental Status: She is alert and oriented to person, place, and time.     Sensory: No  sensory deficit.     Motor: No weakness or abnormal muscle tone.     Coordination: Coordination normal.  Psychiatric:        Mood and Affect: Mood normal.        Behavior: Behavior normal.      Imaging: No results found.

## 2019-12-02 NOTE — Procedures (Signed)
Lumbosacral Transforaminal Epidural Steroid Injection - Sub-Pedicular Approach with Fluoroscopic Guidance  Patient: Cassandra Brooks      Date of Birth: 07-18-1940 MRN: [id number] PCP: Lowella Dandy, NP      Visit Date: 12/02/2019   Universal Protocol:    Date/Time: 12/02/2019  Consent Given By: the patient  Position: PRONE  Additional Comments: Vital signs were monitored before and after the procedure. Patient was prepped and draped in the usual sterile fashion. The correct patient, procedure, and site was verified.   Injection Procedure Details:  Procedure Site One Meds Administered:  Meds ordered this encounter  Medications  . methylPREDNISolone acetate (DEPO-MEDROL) injection 80 mg    Laterality: Left  Location/Site:  L3-L4 L5-S1  Needle size: 22 G  Needle type: Spinal  Needle Placement: Transforaminal  Findings:    -Comments: Excellent flow of contrast along the nerve, nerve root and into the epidural space.  Procedure Details: After squaring off the end-plates to get a true AP view, the C-arm was positioned so that an oblique view of the foramen as noted above was visualized. The target area is just inferior to the "nose of the scotty dog" or sub pedicular. The soft tissues overlying this structure were infiltrated with 2-3 ml. of 1% Lidocaine without Epinephrine.  The spinal needle was inserted toward the target using a "trajectory" view along the fluoroscope beam.  Under AP and lateral visualization, the needle was advanced so it did not puncture dura and was located close the 6 O'Clock position of the pedical in AP tracterory. Biplanar projections were used to confirm position. Aspiration was confirmed to be negative for CSF and/or blood. A 1-2 ml. volume of Isovue-250 was injected and flow of contrast was noted at each level. Radiographs were obtained for documentation purposes.   After attaining the desired flow of contrast documented above, a 0.5 to 1.0 ml  test dose of 0.25% Marcaine was injected into each respective transforaminal space.  The patient was observed for 90 seconds post injection.  After no sensory deficits were reported, and normal lower extremity motor function was noted,   the above injectate was administered so that equal amounts of the injectate were placed at each foramen (level) into the transforaminal epidural space.   Additional Comments:  The patient tolerated the procedure well Dressing: 2 x 2 sterile gauze and Band-Aid    Post-procedure details: Patient was observed during the procedure. Post-procedure instructions were reviewed.  Patient left the clinic in stable condition.

## 2019-12-02 NOTE — Progress Notes (Signed)
Pt states lower back pain. Pt states her shoulder has been giving her pain. Pt states blending over makes the pain worse. Pt states pani meds helps with the pain. Pt has hx of inj on 08/21/19 it didn't do as good as the last, pt states the shot lasted about a month.  Numeric Pain Rating Scale and Functional Assessment Average Pain 5   In the last MONTH (on 0-10 scale) has pain interfered with the following?  1. General activity like being  able to carry out your everyday physical activities such as walking, climbing stairs, carrying groceries, or moving a chair?  Rating(10)   +Driver, -BT, -Dye Allergies.

## 2019-12-17 DIAGNOSIS — R7301 Impaired fasting glucose: Secondary | ICD-10-CM | POA: Diagnosis not present

## 2019-12-17 DIAGNOSIS — F325 Major depressive disorder, single episode, in full remission: Secondary | ICD-10-CM | POA: Diagnosis not present

## 2019-12-17 DIAGNOSIS — F411 Generalized anxiety disorder: Secondary | ICD-10-CM | POA: Diagnosis not present

## 2019-12-17 DIAGNOSIS — M1990 Unspecified osteoarthritis, unspecified site: Secondary | ICD-10-CM | POA: Diagnosis not present

## 2019-12-17 DIAGNOSIS — Z6830 Body mass index (BMI) 30.0-30.9, adult: Secondary | ICD-10-CM | POA: Diagnosis not present

## 2019-12-17 DIAGNOSIS — K219 Gastro-esophageal reflux disease without esophagitis: Secondary | ICD-10-CM | POA: Diagnosis not present

## 2019-12-17 DIAGNOSIS — E785 Hyperlipidemia, unspecified: Secondary | ICD-10-CM | POA: Diagnosis not present

## 2019-12-17 DIAGNOSIS — Z01818 Encounter for other preprocedural examination: Secondary | ICD-10-CM | POA: Diagnosis not present

## 2019-12-17 DIAGNOSIS — I10 Essential (primary) hypertension: Secondary | ICD-10-CM | POA: Diagnosis not present

## 2019-12-18 DIAGNOSIS — H409 Unspecified glaucoma: Secondary | ICD-10-CM | POA: Insufficient documentation

## 2019-12-18 DIAGNOSIS — M1990 Unspecified osteoarthritis, unspecified site: Secondary | ICD-10-CM | POA: Insufficient documentation

## 2019-12-18 DIAGNOSIS — R55 Syncope and collapse: Secondary | ICD-10-CM | POA: Insufficient documentation

## 2019-12-18 DIAGNOSIS — M797 Fibromyalgia: Secondary | ICD-10-CM | POA: Insufficient documentation

## 2019-12-18 DIAGNOSIS — E785 Hyperlipidemia, unspecified: Secondary | ICD-10-CM | POA: Insufficient documentation

## 2019-12-18 DIAGNOSIS — M5136 Other intervertebral disc degeneration, lumbar region: Secondary | ICD-10-CM | POA: Insufficient documentation

## 2019-12-18 DIAGNOSIS — K219 Gastro-esophageal reflux disease without esophagitis: Secondary | ICD-10-CM | POA: Insufficient documentation

## 2019-12-18 DIAGNOSIS — F32A Depression, unspecified: Secondary | ICD-10-CM | POA: Insufficient documentation

## 2019-12-18 DIAGNOSIS — R7303 Prediabetes: Secondary | ICD-10-CM | POA: Insufficient documentation

## 2019-12-18 DIAGNOSIS — E7800 Pure hypercholesterolemia, unspecified: Secondary | ICD-10-CM | POA: Insufficient documentation

## 2019-12-18 DIAGNOSIS — I714 Abdominal aortic aneurysm, without rupture, unspecified: Secondary | ICD-10-CM | POA: Insufficient documentation

## 2019-12-18 DIAGNOSIS — I10 Essential (primary) hypertension: Secondary | ICD-10-CM | POA: Insufficient documentation

## 2019-12-19 ENCOUNTER — Encounter: Payer: Self-pay | Admitting: Cardiology

## 2019-12-19 ENCOUNTER — Other Ambulatory Visit: Payer: Self-pay

## 2019-12-19 ENCOUNTER — Ambulatory Visit (INDEPENDENT_AMBULATORY_CARE_PROVIDER_SITE_OTHER): Payer: Medicare Other | Admitting: Cardiology

## 2019-12-19 VITALS — BP 125/77 | HR 72 | Ht 62.0 in | Wt 162.8 lb

## 2019-12-19 DIAGNOSIS — Z01810 Encounter for preprocedural cardiovascular examination: Secondary | ICD-10-CM

## 2019-12-19 DIAGNOSIS — R0600 Dyspnea, unspecified: Secondary | ICD-10-CM

## 2019-12-19 DIAGNOSIS — Z8679 Personal history of other diseases of the circulatory system: Secondary | ICD-10-CM

## 2019-12-19 DIAGNOSIS — Z9889 Other specified postprocedural states: Secondary | ICD-10-CM

## 2019-12-19 DIAGNOSIS — R0609 Other forms of dyspnea: Secondary | ICD-10-CM

## 2019-12-19 DIAGNOSIS — E088 Diabetes mellitus due to underlying condition with unspecified complications: Secondary | ICD-10-CM

## 2019-12-19 DIAGNOSIS — I10 Essential (primary) hypertension: Secondary | ICD-10-CM

## 2019-12-19 DIAGNOSIS — E782 Mixed hyperlipidemia: Secondary | ICD-10-CM | POA: Insufficient documentation

## 2019-12-19 DIAGNOSIS — Z01818 Encounter for other preprocedural examination: Secondary | ICD-10-CM

## 2019-12-19 HISTORY — DX: Personal history of other diseases of the circulatory system: Z98.890

## 2019-12-19 HISTORY — DX: Essential (primary) hypertension: I10

## 2019-12-19 HISTORY — DX: Mixed hyperlipidemia: E78.2

## 2019-12-19 HISTORY — DX: Diabetes mellitus due to underlying condition with unspecified complications: E08.8

## 2019-12-19 HISTORY — DX: Personal history of other diseases of the circulatory system: Z86.79

## 2019-12-19 HISTORY — DX: Encounter for preprocedural cardiovascular examination: Z01.810

## 2019-12-19 NOTE — Progress Notes (Signed)
Cardiology Office Note:    Date:  12/19/2019   ID:  [name], DOB 10-30-1940, MRN [id number]  PCP:  Lowella Dandy, NP  Cardiologist:  Jenean Lindau, MD   Referring MD: Lowella Dandy, NP    ASSESSMENT:    1. History of AAA (abdominal aortic aneurysm) repair   2. Essential hypertension   3. Mixed dyslipidemia   4. Diabetes mellitus due to underlying condition with unspecified complications ([place])   5. Preoperative clearance   6. DOE (dyspnea on exertion)   7. Preoperative cardiovascular examination   8. Status post AAA (abdominal aortic aneurysm) repair    PLAN:    In order of problems listed above:  1. Secondary prevention stressed with the patient.  Importance of compliance with diet medication stressed she vocalized understanding. 2. Essential hypertension: Blood pressure stable and diet was emphasized 3. Mixed dyslipidemia: Lipids were reviewed and diet was emphasized.  She is on statins by her primary care physician. 4. Diabetes mellitus diet controlled.  Managed by her primary care provider. 5. Preoperative stratification: Patient has mild shortness of breath on exertion.  In view of her symptoms and risk factors she will undergo Lexiscan sestamibi.  If this is negative then she is not at high risk for coronary events during the aforementioned surgery.  Meticulous hemodynamic monitoring will further reduce the risk of coronary events. 6. Post abdominal aortic aneurysm repair: I would like to get this evaluation done before her surgery for obvious reasons. 7. Patient will be seen in follow-up appointment in 6 months or earlier if the patient has any concerns    Medication Adjustments/Labs and Tests Ordered: Current medicines are reviewed at length with the patient today.  Concerns regarding medicines are outlined above.  Orders Placed This Encounter  Procedures  . MYOCARDIAL PERFUSION IMAGING  . EKG 12-Lead  . VAS [place] AAA DUPLEX   No orders of the defined types were  placed in this encounter.    History of Present Illness:    Cassandra Brooks is a 79 y.o. female who is being seen today for the evaluation of preoperative cardiovascular evaluation at the request of Moon, Amy A, NP.  Patient is a pleasant 79 year old female.  She has past medical history of essential hypertension and dyslipidemia and diabetes mellitus diet-controlled.  She has had abdominal aortic aneurysm post repair according to the history that she provides.  She is planning to undergo knee surgery.  She leads a sedentary lifestyle for obvious reasons.  She has some shortness of breath on exertion though this not pronounced.  At the time of my evaluation, the patient is alert awake oriented and in no distress.  Past Medical History:  Diagnosis Date  . AAA (abdominal aortic aneurysm) ([place])   . Arthritis    "knees" (03/21/2017)  . Borderline diabetes   . Bulging lumbar disc   . Depression   . Disequilibrium   . Dizziness   . Fall   . Fibromyalgia   . GERD (gastroesophageal reflux disease)   . Glaucoma, both eyes   . Hemoptysis 03/18/2015  . High cholesterol    "don't have it but I take RX" (03/21/2017)  . Hypertension   . Nuclear cataract of both eyes 01/01/2014  . Primary osteoarthritis of left knee 07/18/2019  . Solitary pulmonary nodule 03/18/2015   03/2015 64mm RLL nodule   . Syncope 03/21/2017  . Syncope and collapse     Past Surgical History:  Procedure Laterality Date  .  ABDOMINAL AORTIC ANEURYSM REPAIR  1966  . APPENDECTOMY  1966  . VAGINAL HYSTERECTOMY     "partial"  . VIDEO BRONCHOSCOPY Bilateral 03/19/2015   Procedure: VIDEO BRONCHOSCOPY WITHOUT FLUORO;  Surgeon: Rigoberto Noel, MD;  Location: [facility];  Service: Cardiopulmonary;  Laterality: Bilateral;    Current Medications: Current Meds  Medication Sig  . amLODipine (NORVASC) 10 MG tablet Take 10 mg by mouth daily.  . baclofen (LIORESAL) 10 MG tablet TAKE 1 TABLET(10 MG) BY MOUTH THREE TIMES DAILY AS NEEDED  FOR SPASM (Patient taking differently: Take 10 mg by mouth 3 (three) times daily as needed for muscle spasms. )  . Calcium-Magnesium-Vitamin D (CALCIUM 1200+D3 PO) Take 1 tablet by mouth daily.  . dorzolamide-timolol (COSOPT) 22.3-6.8 MG/ML ophthalmic solution Place 2 drops into both eyes daily.   . fluticasone (FLONASE) 50 MCG/ACT nasal spray Place 2 sprays into both nostrils daily.  Marland Kitchen gabapentin (NEURONTIN) 100 MG capsule Take 1 capsule (100 mg total) by mouth 2 (two) times daily with breakfast and lunch.  . hydrochlorothiazide (HYDRODIURIL) 12.5 MG tablet Take 12.5 mg by mouth daily.   Marland Kitchen LUMIGAN 0.01 % SOLN 1 drop at bedtime.  . meclizine (ANTIVERT) 12.5 MG tablet Take 12.5 mg by mouth every 6 (six) hours as needed.  . Multiple Vitamin (MULTIVITAMIN) tablet Take 1 tablet by mouth daily.  . nabumetone (RELAFEN) 750 MG tablet Take 750 mg by mouth 2 (two) times daily.  Marland Kitchen omeprazole (PRILOSEC) 20 MG capsule Take 20 mg by mouth daily.  . pravastatin (PRAVACHOL) 20 MG tablet Take 20 mg by mouth daily.  . traMADol-acetaminophen (ULTRACET) 37.5-325 MG tablet Take 1 tablet by mouth every 6 (six) hours as needed.  . venlafaxine XR (EFFEXOR-XR) 150 MG 24 hr capsule Take 1 capsule (150 mg total) by mouth daily with breakfast.  . [DISCONTINUED] Travoprost, BAK Free, (TRAVATAN) 0.004 % SOLN ophthalmic solution Place 1 drop into both eyes at bedtime.     Allergies:   Patient has no known allergies.   Social History   Socioeconomic History  . Marital status: Married    Spouse name: Not on file  . Number of children: Not on file  . Years of education: Not on file  . Highest education level: Not on file  Occupational History  . Occupation: retired    Comment: Charity fundraiser; dietician  Tobacco Use  . Smoking status: Never Smoker  . Smokeless tobacco: Never Used  Vaping Use  . Vaping Use: Never used  Substance and Sexual Activity  . Alcohol use: No  . Drug use: No  . Sexual activity: Not on file    Other Topics Concern  . Not on file  Social History Narrative  . Not on file   Social Determinants of Health   Financial Resource Strain:   . Difficulty of Paying Living Expenses: Not on file  Food Insecurity:   . Worried About Charity fundraiser in the Last Year: Not on file  . Ran Out of Food in the Last Year: Not on file  Transportation Needs:   . Lack of Transportation (Medical): Not on file  . Lack of Transportation (Non-Medical): Not on file  Physical Activity:   . Days of Exercise per Week: Not on file  . Minutes of Exercise per Session: Not on file  Stress:   . Feeling of Stress : Not on file  Social Connections:   . Frequency of Communication with Friends and Family: Not on file  . Frequency  of Social Gatherings with Friends and Family: Not on file  . Attends Religious Services: Not on file  . Active Member of Clubs or Organizations: Not on file  . Attends Archivist Meetings: Not on file  . Marital Status: Not on file     Family History: The patient's family history includes Colon cancer in her brother; Diabetes in her brother, brother, mother, and sister; Glaucoma in her mother; Hypertension in her father; Lung cancer in her brother; Lupus in her child; Stomach cancer in her father.  ROS:   Please see the history of present illness.    All other systems reviewed and are negative.  EKGs/Labs/Other Studies Reviewed:    The following studies were reviewed today: EKG reveals sinus rhythm and nonspecific ST-T changes   Recent Labs: 08/23/2019: BUN 30; Creatinine, Ser 1.12; Hemoglobin 14.4; Platelets 199; Potassium 3.9; Sodium 142  Recent Lipid Panel No results found for: CHOL, TRIG, HDL, CHOLHDL, VLDL, LDLCALC, LDLDIRECT  Physical Exam:    VS:  BP 125/77   Pulse 72   Ht 5\' 2"  (1.575 m)   Wt 162 lb 12.8 oz (73.8 kg)   SpO2 96%   BMI 29.78 kg/m     Wt Readings from Last 3 Encounters:  12/19/19 162 lb 12.8 oz (73.8 kg)  11/22/19 165 lb (74.8  kg)  11/14/19 160 lb (72.6 kg)     GEN: Patient is in no acute distress HEENT: Normal NECK: No JVD; No carotid bruits LYMPHATICS: No lymphadenopathy CARDIAC: S1 S2 regular, 2/6 systolic murmur at the apex. RESPIRATORY:  Clear to auscultation without rales, wheezing or rhonchi  ABDOMEN: Soft, non-tender, non-distended MUSCULOSKELETAL:  No edema; No deformity  SKIN: Warm and dry NEUROLOGIC:  Alert and oriented x 3 PSYCHIATRIC:  Normal affect    Signed, Jenean Lindau, MD  12/19/2019 2:10 PM    [facility]

## 2019-12-19 NOTE — Patient Instructions (Signed)
Medication Instructions:  Your physician recommends that you continue on your current medications as directed. Please refer to the Current Medication list given to you today.  *If you need a refill on your cardiac medications before your next appointment, please call your pharmacy*   Lab Work: None ordered   If you have labs (blood work) drawn today and your tests are completely normal, you will receive your results only by: Marland Kitchen MyChart Message (if you have MyChart) OR . A paper copy in the mail If you have any lab test that is abnormal or we need to change your treatment, we will call you to review the results.   Testing/Procedures Your physician has requested that you have an abdominal aorta duplex. During this test, an ultrasound is used to evaluate the aorta. Allow 30 minutes for this exam. Do not eat after midnight the day before and avoid carbonated beverages  Your physician has requested that you have a lexiscan myoview. For further information please visit [email or web address]. Please follow instruction sheet, as given.   Follow-Up: At [facility], you and your health needs are our priority.  As part of our continuing mission to provide you with exceptional heart care, we have created designated Provider Care Teams.  These Care Teams include your primary Cardiologist (physician) and Advanced Practice Providers (APPs -  Physician Assistants and Nurse Practitioners) who all work together to provide you with the care you need, when you need it.  We recommend signing up for the patient portal called "MyChart".  Sign up information is provided on this After Visit Summary.  MyChart is used to connect with patients for Virtual Visits (Telemedicine).  Patients are able to view lab/test results, encounter notes, upcoming appointments, etc.  Non-urgent messages can be sent to your provider as well.   To learn more about what you can do with MyChart, go to [email or web address].    Your  next appointment:   6 month(s)  The format for your next appointment:   In Person  Provider:   Jyl Heinz, MD   Other Instructions None

## 2019-12-20 ENCOUNTER — Telehealth: Payer: Self-pay

## 2019-12-20 NOTE — Telephone Encounter (Signed)
   [facility] Pre-operative Risk Assessment    HEARTCARE STAFF: - Please ensure there is not already an duplicate clearance open for this procedure. - Under Visit Info/Reason for Call, type in Other and utilize the format Clearance MM/DD/YY or Clearance TBD. Do not use dashes or single digits. - If request is for dental extraction, please clarify the # of teeth to be extracted.  Request for surgical clearance:  1. What type of surgery is being performed?   2. When is this surgery scheduled? Left total knee arthroplasty   3. What type of clearance is required (medical clearance vs. Pharmacy clearance to hold med vs. Both)? Both  4. Are there any medications that need to be held prior to surgery and how long? None specified   5. Practice name and name of physician performing surgery? [facility].   6. What is the office phone number? [phone]   7.   What is the office fax number? [phone]  8.   Anesthesia type (None, local, MAC, general) ? Spinal Block   Gita Kudo 12/20/2019, 11:42 AM  _________________________________________________________________   (provider comments below)

## 2019-12-24 NOTE — Telephone Encounter (Signed)
Patient was recently seen by Dr. Geraldo Pitter for preop clearance. Myoview scheduled for 9/23 prior to final clearance

## 2019-12-25 ENCOUNTER — Telehealth: Payer: Self-pay | Admitting: *Deleted

## 2019-12-25 NOTE — Telephone Encounter (Signed)
Left message on voicemail per DPR in reference to upcoming appointment scheduled on 01/02/2020 at 0815 with detailed instructions given per Myocardial Perfusion Study Information Sheet for the test. LM to arrive 15 minutes early, and that it is imperative to arrive on time for appointment to keep from having the test rescheduled. If you need to cancel or reschedule your appointment, please call the office within 24 hours of your appointment. Failure to do so may result in a cancellation of your appointment, and a $50 no show fee. Phone number given for call back for any questions. No mychart available.Roselene Gray, Ranae Palms

## 2019-12-25 NOTE — Telephone Encounter (Signed)
   Primary Cardiologist: Jenean Lindau, MD  Chart reviewed as part of pre-operative protocol coverage. Patient was seen by Dr. Geraldo Pitter 12/19/19 for pre-operative clearance and further testing is planned. Per office protocol, the ordering provider should follow up testing and provide further decision making about clearance at time of completion. No separate input needed from APP. Will remove from preop box and send to requesting provider to make them aware further testing underway.   Charlie Pitter, PA-C 12/25/2019, 3:53 PM

## 2019-12-26 ENCOUNTER — Ambulatory Visit: Payer: Medicare Other | Admitting: Specialist

## 2019-12-28 DIAGNOSIS — Z23 Encounter for immunization: Secondary | ICD-10-CM | POA: Diagnosis not present

## 2020-01-02 ENCOUNTER — Ambulatory Visit (INDEPENDENT_AMBULATORY_CARE_PROVIDER_SITE_OTHER): Payer: Medicare Other

## 2020-01-02 ENCOUNTER — Other Ambulatory Visit: Payer: Self-pay

## 2020-01-02 DIAGNOSIS — R0600 Dyspnea, unspecified: Secondary | ICD-10-CM

## 2020-01-02 DIAGNOSIS — R0609 Other forms of dyspnea: Secondary | ICD-10-CM

## 2020-01-02 LAB — MYOCARDIAL PERFUSION IMAGING
LV dias vol: 35 mL (ref 46–106)
LV sys vol: 5 mL
Peak HR: 93 {beats}/min
Rest HR: 68 {beats}/min
SDS: 7
SRS: 3
SSS: 10
TID: 0.87

## 2020-01-02 MED ORDER — TECHNETIUM TC 99M TETROFOSMIN IV KIT
32.3000 | PACK | Freq: Once | INTRAVENOUS | Status: AC | PRN
  Administered 2020-01-02: 32.3 via INTRAVENOUS

## 2020-01-02 MED ORDER — REGADENOSON 0.4 MG/5ML IV SOLN
0.4000 mg | Freq: Once | INTRAVENOUS | Status: AC
Start: 2020-01-02 — End: 2020-01-02
  Administered 2020-01-02: 0.4 mg via INTRAVENOUS

## 2020-01-02 MED ORDER — TECHNETIUM TC 99M TETROFOSMIN IV KIT
10.2000 | PACK | Freq: Once | INTRAVENOUS | Status: AC | PRN
  Administered 2020-01-02: 10.2 via INTRAVENOUS

## 2020-01-10 ENCOUNTER — Other Ambulatory Visit: Payer: Self-pay

## 2020-01-10 ENCOUNTER — Ambulatory Visit (INDEPENDENT_AMBULATORY_CARE_PROVIDER_SITE_OTHER): Payer: Medicare Other

## 2020-01-10 DIAGNOSIS — Z9889 Other specified postprocedural states: Secondary | ICD-10-CM | POA: Diagnosis not present

## 2020-01-10 DIAGNOSIS — Z95828 Presence of other vascular implants and grafts: Secondary | ICD-10-CM | POA: Diagnosis not present

## 2020-01-10 NOTE — Progress Notes (Signed)
Abdominal aortic duplex exam performed.  Jimmy Martino Tompson RDCS, RVT 

## 2020-01-20 DIAGNOSIS — R5381 Other malaise: Secondary | ICD-10-CM | POA: Diagnosis not present

## 2020-01-20 DIAGNOSIS — J181 Lobar pneumonia, unspecified organism: Secondary | ICD-10-CM | POA: Diagnosis not present

## 2020-01-20 DIAGNOSIS — N3001 Acute cystitis with hematuria: Secondary | ICD-10-CM | POA: Diagnosis not present

## 2020-01-20 DIAGNOSIS — Z20828 Contact with and (suspected) exposure to other viral communicable diseases: Secondary | ICD-10-CM | POA: Diagnosis not present

## 2020-01-28 DIAGNOSIS — A499 Bacterial infection, unspecified: Secondary | ICD-10-CM | POA: Diagnosis not present

## 2020-01-28 DIAGNOSIS — R0602 Shortness of breath: Secondary | ICD-10-CM | POA: Diagnosis not present

## 2020-01-28 DIAGNOSIS — J189 Pneumonia, unspecified organism: Secondary | ICD-10-CM | POA: Diagnosis not present

## 2020-01-28 DIAGNOSIS — R531 Weakness: Secondary | ICD-10-CM | POA: Diagnosis not present

## 2020-01-28 DIAGNOSIS — M1990 Unspecified osteoarthritis, unspecified site: Secondary | ICD-10-CM | POA: Diagnosis not present

## 2020-01-28 DIAGNOSIS — J90 Pleural effusion, not elsewhere classified: Secondary | ICD-10-CM | POA: Diagnosis not present

## 2020-01-28 DIAGNOSIS — Z6831 Body mass index (BMI) 31.0-31.9, adult: Secondary | ICD-10-CM | POA: Diagnosis not present

## 2020-01-28 DIAGNOSIS — N390 Urinary tract infection, site not specified: Secondary | ICD-10-CM | POA: Diagnosis not present

## 2020-01-30 ENCOUNTER — Ambulatory Visit: Payer: Medicare Other | Admitting: Specialist

## 2020-01-30 ENCOUNTER — Ambulatory Visit (INDEPENDENT_AMBULATORY_CARE_PROVIDER_SITE_OTHER): Payer: Medicare Other | Admitting: Surgery

## 2020-01-30 ENCOUNTER — Other Ambulatory Visit: Payer: Self-pay

## 2020-01-30 ENCOUNTER — Encounter: Payer: Self-pay | Admitting: Surgery

## 2020-01-30 VITALS — BP 143/82 | HR 78 | Ht 62.0 in | Wt 162.0 lb

## 2020-01-30 DIAGNOSIS — M48062 Spinal stenosis, lumbar region with neurogenic claudication: Secondary | ICD-10-CM | POA: Diagnosis not present

## 2020-01-30 NOTE — Progress Notes (Signed)
79 year old white female with known history of lumbar spondylosis returns after having ESI's with Dr. Ernestina Patches August 2021.  Patient states that she is feeling much better with her back pain.  She is pleased from that standpoint.  She continues have ongoing pain in the left knee which she has end-stage DJD.  She was seen by Dr. Erlinda Hong November 22, 2019 and he discussed surgical invention with total knee replacement.  Patient and her daughter who was present state that they are waiting for surgery scheduling.  Left knee pain is having a significant negative impact on her quality of life.  She is hoping to be able to have surgery done in the very near future.  Plan I will have patient follow-up with me for recheck of her lumbar in 3 months.  If low back pain returns she will call and let me know and I will schedule repeat lumbar ESI's with Dr. Ernestina Patches.  In regards to her left knee I will reach out to the surgeons surgery scheduler to see where patient stands on that matter.  Patient is hoping to be able to have her knee done in the very near future.  All questions answered.

## 2020-02-05 DIAGNOSIS — Z23 Encounter for immunization: Secondary | ICD-10-CM | POA: Diagnosis not present

## 2020-02-14 ENCOUNTER — Telehealth: Payer: Self-pay

## 2020-02-14 NOTE — Telephone Encounter (Signed)
   Primary Cardiologist: Jenean Lindau, MD  Chart reviewed as part of pre-operative protocol coverage. Patient was seen by Dr. Geraldo Pitter for pre-op evaluation for upcoming left knee surgery on 12/19/2019. At that time, she noted some shortness of breath with exertion though it was not pronounced. Given sedentary lifestyle, Lexiscan Myoview was ordered for further risk assessment and abdominal ultrasound was ordered for monitoring of AAA repair. Per Dr. Julien Nordmann note, if Myoview negative then she is not at high risk for coronary events during upcoming surgery. Myoview on 01/02/2020 was low risk with no evidence of ischemia. Abdominal ultrasound showed no abdominal aortic aneurysm (largest measurement was 2.6 cm). Therefore, OK to proceed with knee surgery.  I will route this recommendation to the requesting party via Epic fax function and remove from pre-op pool.  Please call with questions.  Darreld Mclean, PA-C 02/14/2020, 5:43 PM

## 2020-02-14 NOTE — Telephone Encounter (Signed)
   [facility] Pre-operative Risk Assessment    HEARTCARE STAFF: - Please ensure there is not already an duplicate clearance open for this procedure. - Under Visit Info/Reason for Call, type in Other and utilize the format Clearance MM/DD/YY or Clearance TBD. Do not use dashes or single digits. - If request is for dental extraction, please clarify the # of teeth to be extracted.  Request for surgical clearance:  1. What type of surgery is being performed? Left total knee arthroplasty   2. When is this surgery scheduled? TBD   3. What type of clearance is required (medical clearance vs. Pharmacy clearance to hold med vs. Both)? Medical  4. Are there any medications that need to be held prior to surgery and how long?   5. Practice name and name of physician performing surgery? [facility]   6. What is the office phone number? [phone]   7.   What is the office fax number? [phone] Atten: Sherrie  8.   Anesthesia type (None, local, MAC, general) ? Spinal Block   Lowella Grip 02/14/2020, 4:15 PM  _________________________________________________________________   (provider comments below)

## 2020-02-18 ENCOUNTER — Telehealth: Payer: Self-pay

## 2020-02-18 NOTE — Telephone Encounter (Signed)
Patient & husband called in to get another injection in her back by dr Ernestina Patches

## 2020-02-18 NOTE — Telephone Encounter (Signed)
Left L3 and L4 TF on 8/23. Reported good relief to Benjiman Core at October follow up. Ok to repeat?

## 2020-02-18 NOTE — Telephone Encounter (Signed)
ok 

## 2020-02-18 NOTE — Telephone Encounter (Signed)
Scheduled for 11/22 at 1345 with driver.

## 2020-03-02 ENCOUNTER — Ambulatory Visit: Payer: Self-pay

## 2020-03-02 ENCOUNTER — Ambulatory Visit (INDEPENDENT_AMBULATORY_CARE_PROVIDER_SITE_OTHER): Payer: Medicare Other | Admitting: Physical Medicine and Rehabilitation

## 2020-03-02 ENCOUNTER — Other Ambulatory Visit: Payer: Self-pay

## 2020-03-02 ENCOUNTER — Encounter: Payer: Self-pay | Admitting: Physical Medicine and Rehabilitation

## 2020-03-02 VITALS — BP 134/83 | HR 79

## 2020-03-02 DIAGNOSIS — M5416 Radiculopathy, lumbar region: Secondary | ICD-10-CM | POA: Diagnosis not present

## 2020-03-02 MED ORDER — METHYLPREDNISOLONE ACETATE 80 MG/ML IJ SUSP
80.0000 mg | Freq: Once | INTRAMUSCULAR | Status: AC
  Administered 2020-03-02: 80 mg

## 2020-03-02 NOTE — Progress Notes (Signed)
Pt state lower back pain. Pt state when she is walking for a long time like shopping it makes her pain worse. Pt state she takes pain meds to help ease the pain. Pt has hx of inj on 12/02/19 pt state the it was great and it lasted for four month.  Numeric Pain Rating Scale and Functional Assessment Average Pain 5   In the last MONTH (on 0-10 scale) has pain interfered with the following?  1. General activity like being  able to carry out your everyday physical activities such as walking, climbing stairs, carrying groceries, or moving a chair?  Rating(10)   +Driver, -BT, -Dye Allergies.

## 2020-03-02 NOTE — Procedures (Signed)
Lumbosacral Transforaminal Epidural Steroid Injection - Sub-Pedicular Approach with Fluoroscopic Guidance  Patient: Cassandra Brooks      Date of Birth: 1940-09-27 MRN: [id number] PCP: Lowella Dandy, NP      Visit Date: 03/02/2020   Universal Protocol:    Date/Time: 03/02/2020  Consent Given By: the patient  Position: PRONE  Additional Comments: Vital signs were monitored before and after the procedure. Patient was prepped and draped in the usual sterile fashion. The correct patient, procedure, and site was verified.   Injection Procedure Details:   Procedure diagnoses:  1. Lumbar radiculopathy      Meds Administered:  Meds ordered this encounter  Medications  . methylPREDNISolone acetate (DEPO-MEDROL) injection 80 mg    Laterality: Bilateral  Location/Site:  L3-L4  Needle:5.0 in., 22 ga.  Short bevel or Quincke spinal needle  Needle Placement: Transforaminal  Findings:    -Comments: Excellent flow of contrast along the nerve, nerve root and into the epidural space.  Procedure Details: After squaring off the end-plates to get a true AP view, the C-arm was positioned so that an oblique view of the foramen as noted above was visualized. The target area is just inferior to the "nose of the scotty dog" or sub pedicular. The soft tissues overlying this structure were infiltrated with 2-3 ml. of 1% Lidocaine without Epinephrine.  The spinal needle was inserted toward the target using a "trajectory" view along the fluoroscope beam.  Under AP and lateral visualization, the needle was advanced so it did not puncture dura and was located close the 6 O'Clock position of the pedical in AP tracterory. Biplanar projections were used to confirm position. Aspiration was confirmed to be negative for CSF and/or blood. A 1-2 ml. volume of Isovue-250 was injected and flow of contrast was noted at each level. Radiographs were obtained for documentation purposes.   After attaining the  desired flow of contrast documented above, a 0.5 to 1.0 ml test dose of 0.25% Marcaine was injected into each respective transforaminal space.  The patient was observed for 90 seconds post injection.  After no sensory deficits were reported, and normal lower extremity motor function was noted,   the above injectate was administered so that equal amounts of the injectate were placed at each foramen (level) into the transforaminal epidural space.   Additional Comments:  The patient tolerated the procedure well Dressing: 2 x 2 sterile gauze and Band-Aid    Post-procedure details: Patient was observed during the procedure. Post-procedure instructions were reviewed.  Patient left the clinic in stable condition.

## 2020-03-13 ENCOUNTER — Other Ambulatory Visit: Payer: Self-pay

## 2020-03-16 ENCOUNTER — Telehealth: Payer: Self-pay

## 2020-03-16 DIAGNOSIS — H04123 Dry eye syndrome of bilateral lacrimal glands: Secondary | ICD-10-CM | POA: Diagnosis not present

## 2020-03-16 DIAGNOSIS — H25813 Combined forms of age-related cataract, bilateral: Secondary | ICD-10-CM | POA: Diagnosis not present

## 2020-03-16 DIAGNOSIS — H401133 Primary open-angle glaucoma, bilateral, severe stage: Secondary | ICD-10-CM | POA: Diagnosis not present

## 2020-03-16 DIAGNOSIS — H3500 Unspecified background retinopathy: Secondary | ICD-10-CM | POA: Diagnosis not present

## 2020-03-16 NOTE — Telephone Encounter (Signed)
I would run this by Dr. Eileen Stanford given the upcoming knee surgery and the fact that I do not see any prior work up for her shoulder?

## 2020-03-16 NOTE — Telephone Encounter (Signed)
Patient called she stated she is having pain in her right shoulder she wants to schedule a appointment she would like a appointment soon if possible,she is scheduled for knee replacement surgery with [facility] 12/17. LK:[phone]

## 2020-03-16 NOTE — Telephone Encounter (Signed)
Please advise 

## 2020-03-16 NOTE — Telephone Encounter (Signed)
Please advise. Patient sees Dr. Louanne Skye and Dr. Erlinda Hong. I do not see shoulder images in Epic.

## 2020-03-17 NOTE — Telephone Encounter (Signed)
We can see her in the office for the shoulder.

## 2020-03-17 NOTE — Telephone Encounter (Signed)
Can you please make appt. Thanks.

## 2020-03-23 ENCOUNTER — Other Ambulatory Visit: Payer: Self-pay | Admitting: Physician Assistant

## 2020-03-23 MED ORDER — OXYCODONE-ACETAMINOPHEN 5-325 MG PO TABS: 1.0000 | ORAL_TABLET | Freq: Four times a day (QID) | ORAL | 0 refills | Status: DC | PRN

## 2020-03-23 MED ORDER — ASPIRIN EC 81 MG PO TBEC: 81.0000 mg | DELAYED_RELEASE_TABLET | Freq: Two times a day (BID) | ORAL | 0 refills | Status: DC

## 2020-03-23 MED ORDER — CEPHALEXIN 500 MG PO CAPS: 500.0000 mg | ORAL_CAPSULE | Freq: Four times a day (QID) | ORAL | 0 refills | Status: DC

## 2020-03-23 MED ORDER — ONDANSETRON HCL 4 MG PO TABS: 4.0000 mg | ORAL_TABLET | Freq: Three times a day (TID) | ORAL | 0 refills | Status: DC | PRN

## 2020-03-23 MED ORDER — DOCUSATE SODIUM 100 MG PO CAPS: 100.0000 mg | ORAL_CAPSULE | Freq: Every day | ORAL | 2 refills | Status: DC | PRN

## 2020-03-23 MED ORDER — METHOCARBAMOL 500 MG PO TABS: 500.0000 mg | ORAL_TABLET | Freq: Two times a day (BID) | ORAL | 0 refills | Status: DC | PRN

## 2020-03-24 ENCOUNTER — Encounter (HOSPITAL_COMMUNITY)
Admission: RE | Admit: 2020-03-24 | Discharge: 2020-03-24 | Disposition: A | Discharge status: Home / Self Care | Payer: Medicare Other | Source: Ambulatory Visit | Attending: Orthopaedic Surgery | Admitting: Orthopaedic Surgery

## 2020-03-24 ENCOUNTER — Other Ambulatory Visit: Payer: Self-pay

## 2020-03-24 ENCOUNTER — Ambulatory Visit (INDEPENDENT_AMBULATORY_CARE_PROVIDER_SITE_OTHER): Payer: Medicare Other

## 2020-03-24 ENCOUNTER — Encounter (HOSPITAL_COMMUNITY): Payer: Self-pay

## 2020-03-24 ENCOUNTER — Other Ambulatory Visit (HOSPITAL_COMMUNITY)
Admission: RE | Admit: 2020-03-24 | Discharge: 2020-03-24 | Disposition: A | Discharge status: Home / Self Care | Payer: Medicare Other | Source: Ambulatory Visit | Attending: Orthopaedic Surgery | Admitting: Orthopaedic Surgery

## 2020-03-24 ENCOUNTER — Ambulatory Visit (INDEPENDENT_AMBULATORY_CARE_PROVIDER_SITE_OTHER): Payer: Medicare Other | Admitting: Orthopaedic Surgery

## 2020-03-24 DIAGNOSIS — Z20822 Contact with and (suspected) exposure to covid-19: Secondary | ICD-10-CM | POA: Insufficient documentation

## 2020-03-24 DIAGNOSIS — M25511 Pain in right shoulder: Secondary | ICD-10-CM

## 2020-03-24 DIAGNOSIS — M1712 Unilateral primary osteoarthritis, left knee: Secondary | ICD-10-CM | POA: Diagnosis not present

## 2020-03-24 DIAGNOSIS — Z01818 Encounter for other preprocedural examination: Secondary | ICD-10-CM | POA: Insufficient documentation

## 2020-03-24 DIAGNOSIS — Z01812 Encounter for preprocedural laboratory examination: Secondary | ICD-10-CM | POA: Insufficient documentation

## 2020-03-24 HISTORY — DX: Prediabetes: R73.03

## 2020-03-24 LAB — COMPREHENSIVE METABOLIC PANEL
ALT: 22 U/L (ref 0–44)
AST: 25 U/L (ref 15–41)
Albumin: 3.7 g/dL (ref 3.5–5.0)
Alkaline Phosphatase: 73 U/L (ref 38–126)
Anion gap: 15 (ref 5–15)
BUN: 31 mg/dL — ABNORMAL HIGH (ref 8–23)
CO2: 24 mmol/L (ref 22–32)
Calcium: 9.6 mg/dL (ref 8.9–10.3)
Chloride: 102 mmol/L (ref 98–111)
Creatinine, Ser: 1.12 mg/dL — ABNORMAL HIGH (ref 0.44–1.00)
GFR, Estimated: 50 mL/min — ABNORMAL LOW (ref >60)
Glucose, Bld: 99 mg/dL (ref 70–99)
Potassium: 3.8 mmol/L (ref 3.5–5.1)
Sodium: 141 mmol/L (ref 135–145)
Total Bilirubin: 1.2 mg/dL (ref 0.3–1.2)
Total Protein: 7.4 g/dL (ref 6.5–8.1)

## 2020-03-24 LAB — CBC WITH DIFFERENTIAL/PLATELET
Abs Immature Granulocytes: 0.02 10*3/uL (ref 0.00–0.07)
Basophils Absolute: 0 10*3/uL (ref 0.0–0.1)
Basophils Relative: 1 %
Eosinophils Absolute: 0.1 10*3/uL (ref 0.0–0.5)
Eosinophils Relative: 2 %
HCT: 44.2 % (ref 36.0–46.0)
Hemoglobin: 14.2 g/dL (ref 12.0–15.0)
Immature Granulocytes: 0 %
Lymphocytes Relative: 29 %
Lymphs Abs: 1.5 10*3/uL (ref 0.7–4.0)
MCH: 34.5 pg — ABNORMAL HIGH (ref 26.0–34.0)
MCHC: 32.1 g/dL (ref 30.0–36.0)
MCV: 107.5 fL — ABNORMAL HIGH (ref 80.0–100.0)
Monocytes Absolute: 0.5 10*3/uL (ref 0.1–1.0)
Monocytes Relative: 9 %
Neutro Abs: 3 10*3/uL (ref 1.7–7.7)
Neutrophils Relative %: 59 %
Platelets: 205 10*3/uL (ref 150–400)
RBC: 4.11 MIL/uL (ref 3.87–5.11)
RDW: 12.5 % (ref 11.5–15.5)
WBC: 5.1 10*3/uL (ref 4.0–10.5)
nRBC: 0 % (ref 0.0–0.2)

## 2020-03-24 LAB — URINALYSIS, ROUTINE W REFLEX MICROSCOPIC
Bilirubin Urine: NEGATIVE
Glucose, UA: NEGATIVE mg/dL
Hgb urine dipstick: NEGATIVE
Ketones, ur: NEGATIVE mg/dL
Nitrite: NEGATIVE
Protein, ur: NEGATIVE mg/dL
Specific Gravity, Urine: 1.025 (ref 1.005–1.030)
pH: 5 (ref 5.0–8.0)

## 2020-03-24 LAB — HEMOGLOBIN A1C
Hgb A1c MFr Bld: 5.5 % (ref 4.8–5.6)
Mean Plasma Glucose: 111.15 mg/dL

## 2020-03-24 LAB — TYPE AND SCREEN
ABO/RH(D): O POS
Antibody Screen: NEGATIVE

## 2020-03-24 LAB — SURGICAL PCR SCREEN
MRSA, PCR: NEGATIVE
Staphylococcus aureus: NEGATIVE

## 2020-03-24 LAB — URINALYSIS, MICROSCOPIC (REFLEX)

## 2020-03-24 LAB — SARS CORONAVIRUS 2 (TAT 6-24 HRS): SARS Coronavirus 2: NEGATIVE

## 2020-03-24 LAB — PROTIME-INR
INR: 1 (ref 0.8–1.2)
Prothrombin Time: 12.8 seconds (ref 11.4–15.2)

## 2020-03-24 LAB — APTT: aPTT: 22 seconds — ABNORMAL LOW (ref 24–36)

## 2020-03-24 NOTE — Progress Notes (Addendum)
PCP - Dr. [name] - medical clearnce on chart.I do not see pre- diabetes mentioned in office note.  Cardiologist - Dr. Geraldo Pitter- clearance was given on 02/14/20  Chest x-ray - 08/23/19- normal. I sent Raiford Noble, PA-C a staff message to ask if patient needs another one, normal in May and shows no s/s. I do not have an answer at this time- will do day of surgery if we need to. Raiford Noble, [place]- C sent a message  That pt does not need another Chest XRay.  EKG - 12/17/19  Stress Test - 01/02/20  ECHO - 2018  Cardiac Cath - no  Sleep Study - no CPAP - no  LABS-CBC, with diff, CMP, PT, PTT, T/S, UA, A1C, PCR.  ASA- patient takes 81 mg, she was not given any instructions, I called and left a message for [name], and asked her to nofity the patient.  ERAS-yes- water 10 ounces- pre-diabetric  HA1C- Fasting Blood Sugar - no pre- diabetic Checks Blood Sugar __0___ times a day  Anesthesia-  Pt denies having chest pain, sob, or fever at this time. All instructions explained to the pt, with a verbal understanding of the material. Pt agrees to go over the instructions while at home for a better understanding. Pt also instructed to self quarantine after being tested for COVID-19. The opportunity to ask questions was provided.

## 2020-03-24 NOTE — Progress Notes (Signed)
Office Visit Note   Patient: Cassandra Brooks           Date of Birth: [birth year]           MRN: [id number] Visit Date: 03/24/2020              Requested by: Lowella Dandy, NP [place],  [place] [address] PCP: Lowella Dandy, NP   Assessment & Plan: Visit Diagnoses:  1. Acute pain of right shoulder     Plan: Impression is right shoulder pain.  Based on findings I have recommended trying a subacromial injection today in hopes of providing her with some relief.  We will see her later this week for total knee replacement.  Follow-Up Instructions: Return if symptoms worsen or fail to improve.   Orders:  Orders Placed This Encounter  Procedures  . XR Shoulder Right   No orders of the defined types were placed in this encounter.     Procedures: No procedures performed   Clinical Data: No additional findings.   Subjective: Chief Complaint  Patient presents with  . Right Shoulder - Pain    Cassandra Brooks is a 79 year old female comes in for evaluation of right shoulder pain for about 2 to 3 weeks.  Denies any injuries or trauma.  She endorses pain around the shoulder.  Denies any radicular pain and only has occasional neck pain.   Review of Systems  Constitutional: Negative.   HENT: Negative.   Eyes: Negative.   Respiratory: Negative.   Cardiovascular: Negative.   Endocrine: Negative.   Musculoskeletal: Negative.   Neurological: Negative.   Hematological: Negative.   Psychiatric/Behavioral: Negative.   All other systems reviewed and are negative.    Objective: Vital Signs: There were no vitals taken for this visit.  Physical Exam Vitals and nursing note reviewed.  Constitutional:      Appearance: She is well-developed and well-nourished.  Pulmonary:     Effort: Pulmonary effort is normal.  Skin:    General: Skin is warm.     Capillary Refill: Capillary refill takes less than 2 seconds.  Neurological:     Mental Status: She is alert and  oriented to person, place, and time.  Psychiatric:        Mood and Affect: Mood and affect normal.        Behavior: Behavior normal.        Thought Content: Thought content normal.        Judgment: Judgment normal.     Ortho Exam Right shoulder shows mild pain with active range of motion at the extremes.  Well-preserved passive range of motion.  Mild pain with forward flexion.  Positive Hawkins.  Mildly positive empty can.  Strength is overall intact to manual muscle testing. Specialty Comments:  No specialty comments available.  Imaging: XR Shoulder Right  Result Date: 03/24/2020 Generalized osteopenia.  No acute abnormalities.  Mild glenohumeral osteoarthritis.  Small calcification near the [facility] joint.    PMFS History: Patient Active Problem List   Diagnosis Date Noted  . Essential hypertension 12/19/2019  . Mixed dyslipidemia 12/19/2019  . Diabetes mellitus due to underlying condition with unspecified complications ([place]) 62/56/3893  . Preoperative cardiovascular examination 12/19/2019  . Status post AAA (abdominal aortic aneurysm) repair 12/19/2019  . Syncope and collapse   . Hypertension   . High cholesterol   . Glaucoma, both eyes   . GERD (gastroesophageal reflux disease)   . Fibromyalgia   . Depression   .  Bulging lumbar disc   . Borderline diabetes   . Arthritis   . AAA (abdominal aortic aneurysm) ([place])   . Primary osteoarthritis of left knee 07/18/2019  . Disequilibrium   . Syncope 03/21/2017  . Dizziness   . Fall   . Hemoptysis 03/18/2015  . Solitary pulmonary nodule 03/18/2015  . Nuclear cataract of both eyes 01/01/2014   Past Medical History:  Diagnosis Date  . AAA (abdominal aortic aneurysm) ([place])   . Arthritis    "knees" (03/21/2017)  . Borderline diabetes   . Bulging lumbar disc   . Depression   . Disequilibrium   . Dizziness   . Fall   . Fibromyalgia   . GERD (gastroesophageal reflux disease)   . Glaucoma, both eyes   . Hemoptysis  03/18/2015  . High cholesterol    "don't have it but I take RX" (03/21/2017)  . Hypertension   . Nuclear cataract of both eyes 01/01/2014  . Pre-diabetes   . Primary osteoarthritis of left knee 07/18/2019  . Solitary pulmonary nodule 03/18/2015   03/2015 42mm RLL nodule   . Syncope 03/21/2017  . Syncope and collapse     Family History  Problem Relation Age of Onset  . Hypertension Father   . Stomach cancer Father   . Glaucoma Mother   . Diabetes Mother   . Diabetes Sister   . Diabetes Brother   . Lupus Child   . Diabetes Brother   . Lung cancer Brother   . Colon cancer Brother     Past Surgical History:  Procedure Laterality Date  . ABDOMINAL AORTIC ANEURYSM REPAIR  1966  . APPENDECTOMY  1966  . COLONOSCOPY    . VAGINAL HYSTERECTOMY     "partial"  . VIDEO BRONCHOSCOPY Bilateral 03/19/2015   Procedure: VIDEO BRONCHOSCOPY WITHOUT FLUORO;  Surgeon: Rigoberto Noel, MD;  Location: [facility];  Service: Cardiopulmonary;  Laterality: Bilateral;   Social History   Occupational History  . Occupation: retired    Comment: Charity fundraiser; dietician  Tobacco Use  . Smoking status: Never Smoker  . Smokeless tobacco: Never Used  Vaping Use  . Vaping Use: Never used  Substance and Sexual Activity  . Alcohol use: No  . Drug use: No  . Sexual activity: Not on file

## 2020-03-24 NOTE — Pre-Procedure Instructions (Signed)
Vern Prestia  03/24/2020     Your procedure is scheduled on Friday, March 17.  Report to [facility], Main Entrance or Entrance "A" at 12:30 PM                Your surgery or procedure is scheduled to begin at 2:30 PM   Call this number if you have problems the morning of surgery: [phone]  This is the number for the Pre- Surgical Desk.                For any other questions, please call [phone], Monday - Friday 8 AM - 4 PM.   Remember:  Do not eat  after midnight, Thursday, December 16  You may drink clear liquids until 11:30 AM.   Clear liquids allowed are:                     Water, Juice (non-citric and without pulp - diabetics please choose diet or no sugar options), Carbonated beverages - (diabetics please choose diet or no sugar options), Clear Tea, Black Coffee only (no creamer, milk or cream including half and half), Plain Jell-O only (diabetics please choose diet or no sugar options), Gatorade (diabetics please choose diet or no sugar options) and Plain Popsicles only    Drink the Pre- Surgery Ensure between 10:30:PM and 11:30 AM   Take these medicines the morning of surgery with A SIP OF WATER :  amLODipine (NORVASC) COSOPT  Eye drops fluticasone (FLONASE) nasal spray gabapentin (NEURONTIN) pravastatin (PRAVACHOL venlafaxine XR (EFFEXOR-XR) omeprazole (PRILOSEC)      1 Week prior to surgery STOP taking Aspirin, Aspirin Products (Goody Powder, Excedrin Migraine), Ibuprofen (Advil), Naproxen (Aleve), Vitamins and Herbal Products (ie Fish Oil).    Special instructions:    [place]- Preparing For Surgery  Before surgery, you can play an important role. Because skin is not sterile, your skin needs to be as free of germs as possible. You can reduce the number of germs on your skin by washing with CHG (chlorahexidine gluconate) Soap before surgery.  CHG is an antiseptic cleaner which kills germs and bonds with the skin to continue killing germs  even after washing.    Oral Hygiene is also important to reduce your risk of infection.  Remember - BRUSH YOUR TEETH THE MORNING OF SURGERY WITH YOUR REGULAR TOOTHPASTE  Please do not use if you have an allergy to CHG or antibacterial soaps. If your skin becomes reddened/irritated stop using the CHG.  Do not shave (including legs and underarms) for at least 48 hours prior to first CHG shower. It is OK to shave your face.  Please follow these instructions carefully.   1. Shower the NIGHT BEFORE SURGERY and the MORNING OF SURGERY with CHG.   2. If you chose to wash your hair, wash your hair first as usual with your normal shampoo.  3. After you shampoo, wash your face and private area with the soap you use at home, then rinse your hair and body thoroughly to remove the shampoo and soap.  4. Use CHG as you would any other liquid soap. You can apply CHG directly to the skin and wash gently with a scrungie or a clean washcloth.   5. Apply the CHG Soap to your body ONLY FROM THE NECK DOWN.  Do not use on open wounds or open sores. Avoid contact with your eyes, ears, mouth and genitals (private parts).   6. Wash  thoroughly, paying special attention to the area where your surgery will be performed.  7. Thoroughly rinse your body with warm water from the neck down.  8. DO NOT shower/wash with your normal soap after using and rinsing off the CHG Soap.  9. Pat yourself dry with a CLEAN TOWEL.  10. Wear CLEAN PAJAMAS to bed the night before surgery, wear comfortable clothes the morning of surgery  11. Place CLEAN SHEETS on your bed the night of your first shower and DO NOT SLEEP WITH PETS.  Day of Surgery: Shower as instructed above. Do not apply any deodorants/lotions, powders or colognes.  Please wear clean clothes to the hospital/surgery center.   Remember to brush your teeth WITH YOUR REGULAR TOOTHPASTE.  Do not wear jewelry, make-up or nail polish.  Do not shave 48 hours prior to  surgery.  Men may shave face and neck.  Do not bring valuables to the hospital.  [facility] is not responsible for any belongings or valuables.  Contacts, dentures or bridgework may not be worn into surgery.  Leave your suitcase in the car.  After surgery it may be brought to your room.  For patients admitted to the hospital, discharge time will be determined by your treatment team.  Patients discharged the day of surgery will not be allowed to drive home.   Please read over the fact sheets that you were given.

## 2020-03-24 NOTE — Pre-Procedure Instructions (Signed)
Cassandra Brooks  03/24/2020     Your procedure is scheduled on Friday, March 17.  Report to [facility], Main Entrance or Entrance "A" at 12:30 PM                Your surgery or procedure is scheduled to begin at 2:30 PM   Call this number if you have problems the morning of surgery: [phone]  This is the number for the Pre- Surgical Desk.                For any other questions, please call [phone], Monday - Friday 8 AM - 4 PM.   Remember:  Do not eat  after midnight, Thursday, December 16  You may drink clear liquids until 11:30 AM.   Clear liquids allowed are:                     Water, Juice (non-citric and without pulp - diabetics please choose diet or no sugar options), Carbonated beverages - (diabetics please choose diet or no sugar options), Clear Tea, Black Coffee only (no creamer, milk or cream including half and half), Plain Jell-O only (diabetics please choose diet or no sugar options), Gatorade (diabetics please choose diet or no sugar options) and Plain Popsicles only    Drink the 10 oz of water between 10:30:PM and 11:30 AM   Take these medicines the morning of surgery with A SIP OF WATER :  amLODipine (NORVASC) COSOPT  Eye drops fluticasone (FLONASE) nasal spray gabapentin (NEURONTIN) pravastatin (PRAVACHOL venlafaxine XR (EFFEXOR-XR) omeprazole (PRILOSEC)    Take if needed : Tylenol Baclofen  Contact your surgeon and ask if you continue Aspirin.   1 Week prior to surgery STOP taking  Aspirin Products (Goody Powder, Excedrin Migraine), Ibuprofen (Advil) ,Nebumetone Naproxen (Aleve), Vitamins and Herbal Products (ie Fish Oil).    Special instructions:    [place]- Preparing For Surgery  Before surgery, you can play an important role. Because skin is not sterile, your skin needs to be as free of germs as possible. You can reduce the number of germs on your skin by washing with CHG (chlorahexidine gluconate) Soap before surgery.  CHG is  an antiseptic cleaner which kills germs and bonds with the skin to continue killing germs even after washing.    Oral Hygiene is also important to reduce your risk of infection.  Remember - BRUSH YOUR TEETH THE MORNING OF SURGERY WITH YOUR REGULAR TOOTHPASTE  Please do not use if you have an allergy to CHG or antibacterial soaps. If your skin becomes reddened/irritated stop using the CHG.  Do not shave (including legs and underarms) for at least 48 hours prior to first CHG shower. It is OK to shave your face.  Please follow these instructions carefully.   1. Shower the NIGHT BEFORE SURGERY and the MORNING OF SURGERY with CHG.   2. If you chose to wash your hair, wash your hair first as usual with your normal shampoo.  3. After you shampoo, wash your face and private area with the soap you use at home, then rinse your hair and body thoroughly to remove the shampoo and soap.  4. Use CHG as you would any other liquid soap. You can apply CHG directly to the skin and wash gently with a scrungie or a clean washcloth.   5. Apply the CHG Soap to your body ONLY FROM THE NECK DOWN.  Do not use on open wounds  or open sores. Avoid contact with your eyes, ears, mouth and genitals (private parts).   6. Wash thoroughly, paying special attention to the area where your surgery will be performed.  7. Thoroughly rinse your body with warm water from the neck down.  8. DO NOT shower/wash with your normal soap after using and rinsing off the CHG Soap.  9. Pat yourself dry with a CLEAN TOWEL.  10. Wear CLEAN PAJAMAS to bed the night before surgery, wear comfortable clothes the morning of surgery  11. Place CLEAN SHEETS on your bed the night of your first shower and DO NOT SLEEP WITH PETS.  Day of Surgery: Shower as instructed above. Do not apply any deodorants/lotions, powders or colognes.  Please wear clean clothes to the hospital/surgery center.   Remember to brush your teeth WITH YOUR REGULAR  TOOTHPASTE.  Do not wear jewelry, make-up or nail polish.  Do not shave 48 hours prior to surgery.  Men may shave face and neck.  Do not bring valuables to the hospital.  [facility] is not responsible for any belongings or valuables.  Contacts, dentures or bridgework may not be worn into surgery.  Leave your suitcase in the car.  After surgery it may be brought to your room.  For patients admitted to the hospital, discharge time will be determined by your treatment team.  Patients discharged the day of surgery will not be allowed to drive home.   Please read over the fact sheets that you were given.

## 2020-03-25 ENCOUNTER — Telehealth: Payer: Self-pay | Admitting: *Deleted

## 2020-03-25 NOTE — Care Plan (Signed)
RNCM call to patient to discuss her upcoming Left total knee replacement with Dr. Erlinda Hong. She is an Ortho bundle patient through [facility] and is agreeable to case management. She lives with her husband and will have assistance after discharge home. CPM ordered through [place]. She has a FWW already and elevated toilet seats. Anticipate HHPT will be needed after short hospital stay. Choice provided and referral made to [facility]. Will continue to follow for any other needs.Reviewed all post op care instructions with patient and her husband.

## 2020-03-25 NOTE — Telephone Encounter (Signed)
Ortho bundle pre-op call completed. 

## 2020-03-26 MED ORDER — BUPIVACAINE LIPOSOME 1.3 % IJ SUSP
20.0000 mL | Freq: Once | INTRAMUSCULAR | Status: DC
  Filled 2020-03-26: qty 20

## 2020-03-26 MED ORDER — TRANEXAMIC ACID 1000 MG/10ML IV SOLN
2000.0000 mg | INTRAVENOUS | Status: AC
  Administered 2020-03-27: 13:00:00 2000 mg via TOPICAL
  Filled 2020-03-26: qty 20

## 2020-03-27 ENCOUNTER — Ambulatory Visit (HOSPITAL_COMMUNITY): Payer: Medicare Other | Admitting: Certified Registered Nurse Anesthetist

## 2020-03-27 ENCOUNTER — Encounter (HOSPITAL_COMMUNITY): Payer: Self-pay | Admitting: Orthopaedic Surgery

## 2020-03-27 ENCOUNTER — Other Ambulatory Visit: Payer: Self-pay

## 2020-03-27 ENCOUNTER — Observation Stay (HOSPITAL_COMMUNITY): Payer: Medicare Other

## 2020-03-27 ENCOUNTER — Observation Stay (HOSPITAL_COMMUNITY)
Admission: RE | Admit: 2020-03-27 | Discharge: 2020-03-28 | Disposition: A | Discharge status: Home / Self Care | Payer: Medicare Other | Attending: Orthopaedic Surgery | Admitting: Orthopaedic Surgery

## 2020-03-27 ENCOUNTER — Encounter (HOSPITAL_COMMUNITY)
Admission: RE | Disposition: A | Discharge status: Home / Self Care | Payer: Self-pay | Source: Home / Self Care | Attending: Orthopaedic Surgery

## 2020-03-27 DIAGNOSIS — M1712 Unilateral primary osteoarthritis, left knee: Principal | ICD-10-CM | POA: Insufficient documentation

## 2020-03-27 DIAGNOSIS — G8918 Other acute postprocedural pain: Secondary | ICD-10-CM | POA: Diagnosis not present

## 2020-03-27 DIAGNOSIS — I10 Essential (primary) hypertension: Secondary | ICD-10-CM | POA: Insufficient documentation

## 2020-03-27 DIAGNOSIS — Z96652 Presence of left artificial knee joint: Secondary | ICD-10-CM

## 2020-03-27 DIAGNOSIS — Z471 Aftercare following joint replacement surgery: Secondary | ICD-10-CM | POA: Diagnosis not present

## 2020-03-27 DIAGNOSIS — E7800 Pure hypercholesterolemia, unspecified: Secondary | ICD-10-CM | POA: Diagnosis not present

## 2020-03-27 HISTORY — PX: TOTAL KNEE ARTHROPLASTY: SHX125

## 2020-03-27 HISTORY — DX: Presence of left artificial knee joint: Z96.652

## 2020-03-27 LAB — ABO/RH: ABO/RH(D): O POS

## 2020-03-27 SURGERY — ARTHROPLASTY, KNEE, TOTAL
Anesthesia: Spinal | Site: Knee | Laterality: Left

## 2020-03-27 MED ORDER — PHENOL 1.4 % MT LIQD: 1.0000 | OROMUCOSAL | Status: DC | PRN

## 2020-03-27 MED ORDER — ACETAMINOPHEN 500 MG PO TABS
1000.0000 mg | ORAL_TABLET | Freq: Once | ORAL | Status: AC
  Administered 2020-03-27: 11:00:00 1000 mg via ORAL
  Filled 2020-03-27: qty 2

## 2020-03-27 MED ORDER — DOCUSATE SODIUM 100 MG PO CAPS
100.0000 mg | ORAL_CAPSULE | Freq: Two times a day (BID) | ORAL | Status: DC
  Administered 2020-03-27 – 2020-03-28 (×2): 100 mg via ORAL
  Filled 2020-03-27 (×2): qty 1

## 2020-03-27 MED ORDER — POLYETHYLENE GLYCOL 3350 17 G PO PACK
17.0000 g | PACK | Freq: Every day | ORAL | Status: DC
  Administered 2020-03-28: 08:00:00 17 g via ORAL
  Filled 2020-03-27: qty 1

## 2020-03-27 MED ORDER — METOCLOPRAMIDE HCL 5 MG/ML IJ SOLN: 5.0000 mg | Freq: Three times a day (TID) | INTRAMUSCULAR | Status: DC | PRN

## 2020-03-27 MED ORDER — FENTANYL CITRATE (PF) 250 MCG/5ML IJ SOLN
INTRAMUSCULAR | Status: AC
  Filled 2020-03-27: qty 5

## 2020-03-27 MED ORDER — BUPIVACAINE IN DEXTROSE 0.75-8.25 % IT SOLN
INTRATHECAL | Status: DC | PRN
  Administered 2020-03-27: 1.6 mL via INTRATHECAL

## 2020-03-27 MED ORDER — ONDANSETRON HCL 4 MG/2ML IJ SOLN
INTRAMUSCULAR | Status: DC | PRN
  Administered 2020-03-27: 4 mg via INTRAVENOUS

## 2020-03-27 MED ORDER — PROPOFOL 500 MG/50ML IV EMUL
INTRAVENOUS | Status: DC | PRN
  Administered 2020-03-27: 80 ug/kg/min via INTRAVENOUS

## 2020-03-27 MED ORDER — DORZOLAMIDE HCL-TIMOLOL MAL 2-0.5 % OP SOLN
2.0000 [drp] | Freq: Every day | OPHTHALMIC | Status: DC
  Administered 2020-03-28: 2 [drp] via OPHTHALMIC
  Filled 2020-03-27: qty 10

## 2020-03-27 MED ORDER — PHENYLEPHRINE HCL-NACL 10-0.9 MG/250ML-% IV SOLN
INTRAVENOUS | Status: DC | PRN
  Administered 2020-03-27: 25 ug/min via INTRAVENOUS

## 2020-03-27 MED ORDER — TRANEXAMIC ACID-NACL 1000-0.7 MG/100ML-% IV SOLN
1000.0000 mg | INTRAVENOUS | Status: AC
  Administered 2020-03-27: 12:00:00 1000 mg via INTRAVENOUS
  Filled 2020-03-27: qty 100

## 2020-03-27 MED ORDER — VANCOMYCIN HCL 1000 MG IV SOLR
INTRAVENOUS | Status: DC | PRN
  Administered 2020-03-27: 1000 mg

## 2020-03-27 MED ORDER — VANCOMYCIN HCL 1000 MG IV SOLR
INTRAVENOUS | Status: AC
  Filled 2020-03-27: qty 1000

## 2020-03-27 MED ORDER — ASPIRIN 81 MG PO CHEW
81.0000 mg | CHEWABLE_TABLET | Freq: Two times a day (BID) | ORAL | Status: DC
  Administered 2020-03-27 – 2020-03-28 (×2): 81 mg via ORAL
  Filled 2020-03-27 (×2): qty 1

## 2020-03-27 MED ORDER — ONDANSETRON HCL 4 MG/2ML IJ SOLN
INTRAMUSCULAR | Status: AC
  Filled 2020-03-27: qty 2

## 2020-03-27 MED ORDER — METHOCARBAMOL 1000 MG/10ML IJ SOLN
500.0000 mg | Freq: Four times a day (QID) | INTRAVENOUS | Status: DC | PRN
  Filled 2020-03-27: qty 5

## 2020-03-27 MED ORDER — CEFAZOLIN SODIUM-DEXTROSE 2-4 GM/100ML-% IV SOLN
2.0000 g | INTRAVENOUS | Status: AC
  Administered 2020-03-27: 12:00:00 2 g via INTRAVENOUS
  Filled 2020-03-27: qty 100

## 2020-03-27 MED ORDER — SODIUM CHLORIDE 0.9% FLUSH
INTRAVENOUS | Status: DC | PRN
  Administered 2020-03-27: 20 mL

## 2020-03-27 MED ORDER — LACTATED RINGERS IV SOLN: INTRAVENOUS | Status: DC

## 2020-03-27 MED ORDER — BUPIVACAINE-EPINEPHRINE 0.25% -1:200000 IJ SOLN
INTRAMUSCULAR | Status: DC | PRN
  Administered 2020-03-27: 20 mL

## 2020-03-27 MED ORDER — OXYCODONE HCL 5 MG PO TABS: 10.0000 mg | ORAL_TABLET | ORAL | Status: DC | PRN

## 2020-03-27 MED ORDER — POVIDONE-IODINE 10 % EX SWAB: 2.0000 "application " | Freq: Once | CUTANEOUS | Status: DC

## 2020-03-27 MED ORDER — METHOCARBAMOL 500 MG PO TABS: 500.0000 mg | ORAL_TABLET | Freq: Four times a day (QID) | ORAL | Status: DC | PRN

## 2020-03-27 MED ORDER — CEFAZOLIN SODIUM-DEXTROSE 2-4 GM/100ML-% IV SOLN
2.0000 g | Freq: Four times a day (QID) | INTRAVENOUS | Status: AC
  Administered 2020-03-27 – 2020-03-28 (×2): 2 g via INTRAVENOUS
  Filled 2020-03-27 (×2): qty 100

## 2020-03-27 MED ORDER — MIDAZOLAM HCL 2 MG/2ML IJ SOLN
INTRAMUSCULAR | Status: AC
  Filled 2020-03-27: qty 2

## 2020-03-27 MED ORDER — ACETAMINOPHEN 325 MG PO TABS
325.0000 mg | ORAL_TABLET | Freq: Four times a day (QID) | ORAL | Status: DC | PRN
Start: 2020-03-28 — End: 2020-03-28

## 2020-03-27 MED ORDER — HYDROMORPHONE HCL 1 MG/ML IJ SOLN: 0.5000 mg | INTRAMUSCULAR | Status: DC | PRN

## 2020-03-27 MED ORDER — OXYCODONE HCL 5 MG PO TABS
5.0000 mg | ORAL_TABLET | ORAL | Status: DC | PRN
Start: 2020-03-27 — End: 2020-03-28

## 2020-03-27 MED ORDER — GABAPENTIN 100 MG PO CAPS
100.0000 mg | ORAL_CAPSULE | Freq: Every day | ORAL | Status: DC
  Administered 2020-03-28: 08:00:00 100 mg via ORAL
  Filled 2020-03-27: qty 1

## 2020-03-27 MED ORDER — ONDANSETRON HCL 4 MG/2ML IJ SOLN: 4.0000 mg | Freq: Four times a day (QID) | INTRAMUSCULAR | Status: DC | PRN

## 2020-03-27 MED ORDER — CLONIDINE HCL (ANALGESIA) 100 MCG/ML EP SOLN
EPIDURAL | Status: DC | PRN
  Administered 2020-03-27: 50 ug

## 2020-03-27 MED ORDER — MENTHOL 3 MG MT LOZG: 1.0000 | LOZENGE | OROMUCOSAL | Status: DC | PRN

## 2020-03-27 MED ORDER — SODIUM CHLORIDE 0.9 % IR SOLN
Status: DC | PRN
  Administered 2020-03-27: 3000 mL

## 2020-03-27 MED ORDER — BUPIVACAINE-EPINEPHRINE (PF) 0.25% -1:200000 IJ SOLN
INTRAMUSCULAR | Status: AC
  Filled 2020-03-27: qty 30

## 2020-03-27 MED ORDER — VENLAFAXINE HCL ER 150 MG PO CP24
150.0000 mg | ORAL_CAPSULE | Freq: Every day | ORAL | Status: DC
  Administered 2020-03-28: 08:00:00 150 mg via ORAL
  Filled 2020-03-27: qty 1

## 2020-03-27 MED ORDER — CHLORHEXIDINE GLUCONATE 0.12 % MT SOLN
OROMUCOSAL | Status: AC
  Filled 2020-03-27: qty 15

## 2020-03-27 MED ORDER — ROPIVACAINE HCL 5 MG/ML IJ SOLN
INTRAMUSCULAR | Status: DC | PRN
  Administered 2020-03-27: 20 mL via PERINEURAL

## 2020-03-27 MED ORDER — FENTANYL CITRATE (PF) 100 MCG/2ML IJ SOLN: 50.0000 ug | Freq: Once | INTRAMUSCULAR | Status: AC

## 2020-03-27 MED ORDER — LACTATED RINGERS IV SOLN: INTRAVENOUS | Status: DC | PRN

## 2020-03-27 MED ORDER — IRRISEPT - 450ML BOTTLE WITH 0.05% CHG IN STERILE WATER, USP 99.95% OPTIME
TOPICAL | Status: DC | PRN
  Administered 2020-03-27: 13:00:00 450 mL via TOPICAL

## 2020-03-27 MED ORDER — ONDANSETRON HCL 4 MG PO TABS: 4.0000 mg | ORAL_TABLET | Freq: Four times a day (QID) | ORAL | Status: DC | PRN

## 2020-03-27 MED ORDER — METOCLOPRAMIDE HCL 5 MG PO TABS: 5.0000 mg | ORAL_TABLET | Freq: Three times a day (TID) | ORAL | Status: DC | PRN

## 2020-03-27 MED ORDER — FENTANYL CITRATE (PF) 100 MCG/2ML IJ SOLN
INTRAMUSCULAR | Status: AC
  Administered 2020-03-27: 11:00:00 50 ug via INTRAVENOUS
  Filled 2020-03-27: qty 2

## 2020-03-27 MED ORDER — 0.9 % SODIUM CHLORIDE (POUR BTL) OPTIME
TOPICAL | Status: DC | PRN
  Administered 2020-03-27: 13:00:00 1000 mL

## 2020-03-27 MED ORDER — AMLODIPINE BESYLATE 10 MG PO TABS
10.0000 mg | ORAL_TABLET | Freq: Every day | ORAL | Status: DC
  Administered 2020-03-28: 08:00:00 10 mg via ORAL
  Filled 2020-03-27: qty 1

## 2020-03-27 MED ORDER — CELECOXIB 200 MG PO CAPS
200.0000 mg | ORAL_CAPSULE | Freq: Once | ORAL | Status: AC
  Administered 2020-03-27: 11:00:00 200 mg via ORAL
  Filled 2020-03-27: qty 1

## 2020-03-27 MED ORDER — DIPHENHYDRAMINE HCL 12.5 MG/5ML PO ELIX: 12.5000 mg | ORAL_SOLUTION | ORAL | Status: DC | PRN

## 2020-03-27 MED ORDER — DEXAMETHASONE SODIUM PHOSPHATE 10 MG/ML IJ SOLN
10.0000 mg | Freq: Once | INTRAMUSCULAR | Status: AC
  Administered 2020-03-28: 08:00:00 10 mg via INTRAVENOUS
  Filled 2020-03-27: qty 1

## 2020-03-27 MED ORDER — PROPOFOL 10 MG/ML IV BOLUS
INTRAVENOUS | Status: DC | PRN
  Administered 2020-03-27: 10 mg via INTRAVENOUS
  Administered 2020-03-27: 20 mg via INTRAVENOUS

## 2020-03-27 MED ORDER — ACETAMINOPHEN 500 MG PO TABS
1000.0000 mg | ORAL_TABLET | Freq: Four times a day (QID) | ORAL | Status: DC
  Administered 2020-03-27 – 2020-03-28 (×3): 1000 mg via ORAL
  Filled 2020-03-27 (×3): qty 2

## 2020-03-27 MED ORDER — PROPOFOL 10 MG/ML IV BOLUS
INTRAVENOUS | Status: AC
  Filled 2020-03-27: qty 20

## 2020-03-27 MED ORDER — HYDROCHLOROTHIAZIDE 12.5 MG PO CAPS
12.5000 mg | ORAL_CAPSULE | Freq: Every day | ORAL | Status: DC
  Administered 2020-03-28: 08:00:00 12.5 mg via ORAL
  Filled 2020-03-27: qty 1

## 2020-03-27 MED ORDER — KETOROLAC TROMETHAMINE 15 MG/ML IJ SOLN
7.5000 mg | Freq: Four times a day (QID) | INTRAMUSCULAR | Status: DC
  Administered 2020-03-27 – 2020-03-28 (×3): 7.5 mg via INTRAVENOUS
  Filled 2020-03-27 (×3): qty 1

## 2020-03-27 SURGICAL SUPPLY — 75 items
ALCOHOL 70% 16 OZ (MISCELLANEOUS) ×2 IMPLANT
BAG DECANTER FOR FLEXI CONT (MISCELLANEOUS) ×2 IMPLANT
BANDAGE ESMARK 6X9 LF (GAUZE/BANDAGES/DRESSINGS) IMPLANT
BLADE SAG 18X100X1.27 (BLADE) ×2 IMPLANT
BNDG ESMARK 6X9 LF (GAUZE/BANDAGES/DRESSINGS)
BOWL SMART MIX CTS (DISPOSABLE) ×2 IMPLANT
CEMENT BONE REFOBACIN R1X40 US (Cement) ×4 IMPLANT
CLSR STERI-STRIP ANTIMIC 1/2X4 (GAUZE/BANDAGES/DRESSINGS) ×4 IMPLANT
COMP FEM CEMT PERS SZ 7 LT (Joint) ×2 IMPLANT
COMPONENT FEM CMT PERS SZ7 LT (Joint) ×1 IMPLANT
COOLER ICEMAN CLASSIC (MISCELLANEOUS) ×2 IMPLANT
COVER SURGICAL LIGHT HANDLE (MISCELLANEOUS) ×2 IMPLANT
COVER WAND RF STERILE (DRAPES) IMPLANT
CUFF TOURN SGL QUICK 34 (TOURNIQUET CUFF) ×1
CUFF TOURN SGL QUICK 42 (TOURNIQUET CUFF) IMPLANT
CUFF TRNQT CYL 34X4.125X (TOURNIQUET CUFF) ×1 IMPLANT
DERMABOND ADVANCED (GAUZE/BANDAGES/DRESSINGS) ×1
DERMABOND ADVANCED .7 DNX12 (GAUZE/BANDAGES/DRESSINGS) ×1 IMPLANT
DRAPE EXTREMITY T 121X128X90 (DISPOSABLE) ×2 IMPLANT
DRAPE HALF SHEET 40X57 (DRAPES) ×2 IMPLANT
DRAPE INCISE IOBAN 66X45 STRL (DRAPES) IMPLANT
DRAPE ORTHO SPLIT 77X108 STRL (DRAPES) ×2
DRAPE POUCH INSTRU U-SHP 10X18 (DRAPES) ×2 IMPLANT
DRAPE SURG ORHT 6 SPLT 77X108 (DRAPES) ×2 IMPLANT
DRAPE U-SHAPE 47X51 STRL (DRAPES) ×4 IMPLANT
DRSG AQUACEL AG ADV 3.5X10 (GAUZE/BANDAGES/DRESSINGS) ×2 IMPLANT
DURAPREP 26ML APPLICATOR (WOUND CARE) ×6 IMPLANT
ELECT CAUTERY BLADE 6.4 (BLADE) ×2 IMPLANT
ELECT REM PT RETURN 9FT ADLT (ELECTROSURGICAL) ×2
ELECTRODE REM PT RTRN 9FT ADLT (ELECTROSURGICAL) ×1 IMPLANT
GLOVE BIOGEL PI IND STRL 7.0 (GLOVE) ×1 IMPLANT
GLOVE BIOGEL PI INDICATOR 7.0 (GLOVE) ×1
GLOVE ECLIPSE 7.0 STRL STRAW (GLOVE) ×6 IMPLANT
GLOVE SKINSENSE NS SZ7.5 (GLOVE) ×3
GLOVE SKINSENSE STRL SZ7.5 (GLOVE) ×3 IMPLANT
GLOVE SURG SYN 7.5  E (GLOVE) ×4
GLOVE SURG SYN 7.5 E (GLOVE) ×4 IMPLANT
GOWN STRL REIN XL XLG (GOWN DISPOSABLE) ×2 IMPLANT
GOWN STRL REUS W/ TWL LRG LVL3 (GOWN DISPOSABLE) ×1 IMPLANT
GOWN STRL REUS W/TWL LRG LVL3 (GOWN DISPOSABLE) ×1
HANDPIECE INTERPULSE COAX TIP (DISPOSABLE) ×1
HOOD PEEL AWAY FLYTE STAYCOOL (MISCELLANEOUS) ×4 IMPLANT
INSERT FIXED AS SZ 6-7 13 LT (Insert) ×2 IMPLANT
JET LAVAGE IRRISEPT WOUND (IRRIGATION / IRRIGATOR) ×2
KIT BASIN OR (CUSTOM PROCEDURE TRAY) ×2 IMPLANT
KIT TURNOVER KIT B (KITS) ×2 IMPLANT
LAVAGE JET IRRISEPT WOUND (IRRIGATION / IRRIGATOR) ×1 IMPLANT
MANIFOLD NEPTUNE II (INSTRUMENTS) ×2 IMPLANT
MARKER SKIN DUAL TIP RULER LAB (MISCELLANEOUS) ×2 IMPLANT
NEEDLE SPNL 18GX3.5 QUINCKE PK (NEEDLE) ×4 IMPLANT
NS IRRIG 1000ML POUR BTL (IV SOLUTION) ×2 IMPLANT
PACK TOTAL JOINT (CUSTOM PROCEDURE TRAY) ×2 IMPLANT
PAD ARMBOARD 7.5X6 YLW CONV (MISCELLANEOUS) ×4 IMPLANT
PAD COLD SHLDR WRAP-ON (PAD) ×2 IMPLANT
SAW OSC TIP CART 19.5X105X1.3 (SAW) ×2 IMPLANT
SET HNDPC FAN SPRY TIP SCT (DISPOSABLE) ×1 IMPLANT
STAPLER VISISTAT 35W (STAPLE) IMPLANT
STEM POLY PAT PLY 29M KNEE (Knees) ×2 IMPLANT
STEM TIBIA 5 DEG SZ E L KNEE (Knees) ×1 IMPLANT
SUCTION FRAZIER HANDLE 10FR (MISCELLANEOUS) ×1
SUCTION TUBE FRAZIER 10FR DISP (MISCELLANEOUS) ×1 IMPLANT
SUT ETHILON 2 0 FS 18 (SUTURE) ×10 IMPLANT
SUT MNCRL AB 4-0 PS2 18 (SUTURE) IMPLANT
SUT VIC AB 0 CT1 27 (SUTURE) ×3
SUT VIC AB 0 CT1 27XBRD ANBCTR (SUTURE) ×3 IMPLANT
SUT VIC AB 1 CTX 27 (SUTURE) ×6 IMPLANT
SUT VIC AB 2-0 CT1 27 (SUTURE) ×4
SUT VIC AB 2-0 CT1 TAPERPNT 27 (SUTURE) ×4 IMPLANT
SYR 50ML LL SCALE MARK (SYRINGE) ×4 IMPLANT
TIBIA STEM 5 DEG SZ E L KNEE (Knees) ×2 IMPLANT
TOWEL GREEN STERILE (TOWEL DISPOSABLE) ×2 IMPLANT
TOWEL GREEN STERILE FF (TOWEL DISPOSABLE) ×2 IMPLANT
TRAY CATH 16FR W/PLASTIC CATH (SET/KITS/TRAYS/PACK) IMPLANT
UNDERPAD 30X36 HEAVY ABSORB (UNDERPADS AND DIAPERS) ×2 IMPLANT
WRAP KNEE MAXI GEL POST OP (GAUZE/BANDAGES/DRESSINGS) ×2 IMPLANT

## 2020-03-27 NOTE — Anesthesia Procedure Notes (Signed)
Spinal  Patient location during procedure: OR Start time: 03/27/2020 12:13 PM End time: 03/27/2020 12:16 PM Staffing Performed: anesthesiologist  Anesthesiologist: Suzette Battiest, MD Preanesthetic Checklist Completed: patient identified, IV checked, site marked, risks and benefits discussed, surgical consent, monitors and equipment checked, pre-op evaluation and timeout performed Spinal Block Patient position: sitting Prep: DuraPrep Patient monitoring: heart rate, cardiac monitor, continuous pulse ox and blood pressure Approach: midline Location: L4-5 Injection technique: single-shot Needle Needle type: Pencan  Needle gauge: 24 G Needle length: 9 cm Assessment Sensory level: T4

## 2020-03-27 NOTE — Op Note (Signed)
Total Knee Arthroplasty Procedure Note  Preoperative diagnosis: Left knee osteoarthritis  Postoperative diagnosis:same  Operative procedure: Left total knee arthroplasty. CPT [phone]  Surgeon: N. Eduard Roux, MD  Assist: Madalyn Rob, PA-C; necessary for the timely completion of procedure and due to complexity of procedure.  Anesthesia: Spinal, regional, local  Tourniquet time: see anesthesia record  Implants used: Zimmer persona Femur: CR 7 Tibia: E Patella: 29 mm Polyethylene: 13 mm, MC  Indication: Cassandra Brooks is a 79 y.o. year old adult with a history of knee pain. Having failed conservative management, the patient elected to proceed with a total knee arthroplasty.  We have reviewed the risk and benefits of the surgery and they elected to proceed after voicing understanding.  Procedure:  After informed consent was obtained and understanding of the risk were voiced including but not limited to bleeding, infection, damage to surrounding structures including nerves and vessels, blood clots, leg length inequality and the failure to achieve desired results, the operative extremity was marked with verbal confirmation of the patient in the holding area.   The patient was then brought to the operating room and transported to the operating room table in the supine position.  A tourniquet was applied to the operative extremity around the upper thigh. The operative limb was then prepped and draped in the usual sterile fashion and preoperative antibiotics were administered.  A time out was performed prior to the start of surgery confirming the correct extremity, preoperative antibiotic administration, as well as team members, implants and instruments available for the case. Correct surgical site was also confirmed with preoperative radiographs. The limb was then elevated for exsanguination and the tourniquet was inflated. A midline incision was made and a standard medial  parapatellar approach was performed.  The infrapatellar fat pad was removed.  Suprapatellar synovium was removed to reveal the anterior distal femoral cortex.  A medial peel was performed to release the capsule of the medial tibial plateau.  The patella was then everted and was prepared and sized to a 29 mm.  A cover was placed on the patella for protection from retractors.  The knee was then brought into full flexion and we then turned our attention to the femur.  The cruciates were sacrificed.  Start site was drilled in the femur and the intramedullary distal femoral cutting guide was placed, set at 5 degrees valgus, taking 10 mm of distal resection. The distal cut was made. Osteophytes were then removed.  Next, the proximal tibial cutting guide was placed with appropriate slope, varus/valgus alignment and depth of resection. The proximal tibial cut was made. Gap blocks were then used to assess the extension gap and alignment, and appropriate soft tissue releases were performed. Attention was turned back to the femur, which was sized using the sizing guide to a size 7. Appropriate rotation of the femoral component was determined using epicondylar axis, Whiteside's line, and assessing the flexion gap under ligament tension. The appropriate size 4-in-1 cutting block was placed and checked with an angel wing and cuts were made. Posterior femoral osteophytes and uncapped bone were then removed with the curved osteotome.  Trial components were placed, and stability was checked in full extension, mid-flexion, and deep flexion. Proper tibial rotation was determined and marked.  The patella tracked well without a lateral release.  The femoral lugs were then drilled. Trial components were then removed and tibial preparation performed.  The tibia was sized for a size E component.  A posterior capsular injection  comprising of 20 cc of 1.3% exparel, 20 cc of 0.25% bupivicaine with epi and 20 cc of normal saline was  performed for postoperative pain control. The bony surfaces were irrigated with a pulse lavage and then dried. Bone cement was vacuum mixed on the back table, and the final components sized above were cemented into place.  Antibiotic irrigation was placed in the knee joint and soft tissues while the cement cured.  After cement had finished curing, excess cement was removed. The stability of the construct was re-evaluated throughout a range of motion and found to be acceptable. The trial liner was removed, the knee was copiously irrigated, and the knee was re-evaluated for any excess bone debris. The real polyethylene liner, 13 mm thick, was inserted and checked to ensure the locking mechanism had engaged appropriately. The tourniquet was deflated and hemostasis was achieved. The wound was irrigated with normal saline.  One gram of vancomycin powder was placed in the surgical bed.  Capsular closure was performed with a #1 vicryl, subcutaneous fat closed with a 0 vicryl suture, then subcutaneous tissue closed with interrupted 2.0 vicryl suture. The skin was then closed with a 2.0 nylon and dermabond. A sterile dressing was applied.  The patient was awakened in the operating room and taken to recovery in stable condition. All sponge, needle, and instrument counts were correct at the end of the case.  Position: supine  Complications: none.  Time Out: performed   Drains/Packing: none  Estimated blood loss: minimal  Returned to Recovery Room: in good condition.   Antibiotics: yes   Mechanical VTE (DVT) Prophylaxis: sequential compression devices, TED thigh-high  Chemical VTE (DVT) Prophylaxis: aspirin  Fluid Replacement  Crystalloid: see anesthesia record Blood: none  FFP: none   Specimens Removed: 1 to pathology   Sponge and Instrument Count Correct? yes   PACU: portable radiograph - knee AP and Lateral   Plan/RTC: Return in 2 weeks for wound check.   Weight Bearing/Load Lower Extremity:  full   Implant Name Type Inv. Item Serial No. Manufacturer Lot No. LRB No. Used Action  CEMENT BONE REFOBACIN [id number] [place] - [id number] Cement CEMENT BONE REFOBACIN [id number] [place]  ZIMMER RECON(ORTH,TRAU,BIO,SG)  Left 2 Implanted  STEM POLY PAT PLY 20M KNEE - [id number] Knees STEM POLY PAT PLY 20M KNEE  ZIMMER RECON(ORTH,TRAU,BIO,SG)  Left 1 Implanted  COMP FEM CEMT PERS SZ 7 LT - [id number] Joint COMP FEM CEMT PERS SZ 7 LT  ZIMMER RECON(ORTH,TRAU,BIO,SG)  Left 1 Implanted  TIBIA STEM 5 DEG SZ E L KNEE - [id number] Knees TIBIA STEM 5 DEG SZ E L KNEE  ZIMMER RECON(ORTH,TRAU,BIO,SG)  Left 1 Implanted  VIVACIT-E HIGHLT CROSSLINKED POLYETHYLENE ARTICULAR SURFACE MEDIAL CONGRUENT    Zimmer [id number] Left 1 Implanted    N. Eduard Roux, MD [facility] 1:52 PM

## 2020-03-27 NOTE — Transfer of Care (Signed)
Immediate Anesthesia Transfer of Care Note  Patient: Bailynn Dyk  Procedure(s) Performed: LEFT TOTAL KNEE ARTHROPLASTY (Left Knee)  Patient Location: PACU  Anesthesia Type:MAC and Spinal  Level of Consciousness: drowsy and patient cooperative  Airway & Oxygen Therapy: Patient Spontanous Breathing and Patient connected to face mask oxygen  Post-op Assessment: Report given to RN and Post -op Vital signs reviewed and stable  Post vital signs: Reviewed  Last Vitals:  Vitals Value Taken Time  BP 116/56 03/27/20 1439  Temp    Pulse 65 03/27/20 1442  Resp 15 03/27/20 1442  SpO2 96 % 03/27/20 1442  Vitals shown include unvalidated device data.  Last Pain:  Vitals:   03/27/20 1130  TempSrc:   PainSc: 0-No pain      Patients Stated Pain Goal: 3 (24/09/73 5329)  Complications: No complications documented.

## 2020-03-27 NOTE — Progress Notes (Signed)
Orthopedic Tech Progress Note Patient Details:  Cassandra Brooks Aug 25, 1940 [id number]  CPM Left Knee CPM Left Knee: On Left Knee Flexion (Degrees): 0 Left Knee Extension (Degrees): 90  Post Interventions Patient Tolerated: Well Instructions Provided: Care of device  Konnor Jorden 03/27/2020, 3:33 PM

## 2020-03-27 NOTE — Anesthesia Procedure Notes (Signed)
Procedure Name: MAC Date/Time: 03/27/2020 12:12 PM Performed by: Janene Harvey, CRNA Pre-anesthesia Checklist: Patient identified, Emergency Drugs available, Suction available, Patient being monitored and Timeout performed Patient Re-evaluated:Patient Re-evaluated prior to induction Oxygen Delivery Method: Simple face mask Induction Type: IV induction Placement Confirmation: positive ETCO2 Dental Injury: Teeth and Oropharynx as per pre-operative assessment

## 2020-03-27 NOTE — Care Plan (Signed)
Ortho Bundle Case Management Note  Patient Details  Name: Cassandra Brooks MRN: [id number] Date of Birth: Feb 07, 1941     RNCM call to patient to discuss her upcoming Left total knee replacement with Dr. Erlinda Hong. She is an Ortho bundle patient through [facility] and is agreeable to case management. She lives with her husband and will have assistance after discharge home. CPM ordered through [place]. She has a FWW already and elevated toilet seats. Anticipate HHPT will be needed after short hospital stay. Choice provided and referral made to [facility]. Will continue to follow for any other needs.Reviewed all post op care instructions with patient and her husband.          DME Arranged:  3-N-1 (Patient has a FWW and BSC/3in1 at home; CPM delivered on 03/26/20 by [employer]) DME Agency:  [employer]  HH Arranged:  PT [place] Agency:  [facility] (now [facility])  Additional Comments: Please contact me with any questions of if this plan should need to change.  Jamse Arn, RN, BSN, [employer]  [phone] 03/27/2020, 4:07 PM

## 2020-03-27 NOTE — Anesthesia Procedure Notes (Signed)
Anesthesia Regional Block: Adductor canal block   Pre-Anesthetic Checklist: ,, timeout performed, Correct Patient, Correct Site, Correct Laterality, Correct Procedure, Correct Position, site marked, Risks and benefits discussed,  Surgical consent,  Pre-op evaluation,  At surgeon's request and post-op pain management  Laterality: Left  Prep: chloraprep       Needles:  Injection technique: Single-shot  Needle Type: Echogenic Needle     Needle Length: 9cm  Needle Gauge: 21     Additional Needles:   Procedures:,,,, ultrasound used (permanent image in chart),,,,  Narrative:  Start time: 03/27/2020 11:07 AM End time: 03/27/2020 11:13 AM Injection made incrementally with aspirations every 5 mL.  Performed by: Personally  Anesthesiologist: Suzette Battiest, MD

## 2020-03-27 NOTE — Anesthesia Preprocedure Evaluation (Signed)
Anesthesia Evaluation  Patient identified by MRN, date of birth, ID band Patient awake    Reviewed: Allergy & Precautions, NPO status , Patient's Chart, lab work & pertinent test results  Airway Mallampati: II  TM Distance: >3 FB     Dental  (+) Dental Advisory Given   Pulmonary    breath sounds clear to auscultation       Cardiovascular hypertension, Pt. on medications  Rhythm:Regular Rate:Normal  9/21 Nuclear stress test:  Nuclear stress EF: 84%.  The left ventricular ejection fraction is hyperdynamic (>65%).  There was no ST segment deviation noted during stress.  No T wave inversion was noted during stress.  The study is normal.  This is a low risk study.     Neuro/Psych    GI/Hepatic Neg liver ROS, GERD  ,  Endo/Other  diabetes, Type 2  Renal/GU negative Renal ROS     Musculoskeletal  (+) Arthritis , Fibromyalgia -  Abdominal   Peds  Hematology negative hematology ROS (+)   Anesthesia Other Findings   Reproductive/Obstetrics                             Lab Results  Component Value Date   WBC 5.1 03/24/2020   HGB 14.2 03/24/2020   HCT 44.2 03/24/2020   MCV 107.5 (H) 03/24/2020   PLT 205 03/24/2020   Lab Results  Component Value Date   CREATININE 1.12 (H) 03/24/2020   BUN 31 (H) 03/24/2020   NA 141 03/24/2020   K 3.8 03/24/2020   CL 102 03/24/2020   CO2 24 03/24/2020    Anesthesia Physical Anesthesia Plan  ASA: III  Anesthesia Plan: Spinal   Post-op Pain Management:  Regional for Post-op pain   Induction:   PONV Risk Score and Plan: 2 and Propofol infusion, Ondansetron and Treatment may vary due to age or medical condition  Airway Management Planned: Natural Airway and Simple Face Mask  Additional Equipment:   Intra-op Plan:   Post-operative Plan:   Informed Consent: I have reviewed the patients History and Physical, chart, labs and discussed the  procedure including the risks, benefits and alternatives for the proposed anesthesia with the patient or authorized representative who has indicated his/her understanding and acceptance.       Plan Discussed with: CRNA  Anesthesia Plan Comments:         Anesthesia Quick Evaluation

## 2020-03-27 NOTE — Anesthesia Postprocedure Evaluation (Signed)
Anesthesia Post Note  Patient: Cassandra Brooks  Procedure(s) Performed: LEFT TOTAL KNEE ARTHROPLASTY (Left Knee)     Patient location during evaluation: PACU Anesthesia Type: Spinal Level of consciousness: awake and alert Pain management: pain level controlled Vital Signs Assessment: post-procedure vital signs reviewed and stable Respiratory status: spontaneous breathing and respiratory function stable Cardiovascular status: blood pressure returned to baseline and stable Postop Assessment: spinal receding Anesthetic complications: no   No complications documented.  Last Vitals:  Vitals:   03/27/20 1515 03/27/20 1530  BP: 134/72 (!) 135/96  Pulse: (!) 56 (!) 57  Resp: 12 13  Temp:    SpO2: 97% 95%    Last Pain:  Vitals:   03/27/20 1500  TempSrc:   PainSc: 0-No pain                 Tiajuana Amass

## 2020-03-27 NOTE — H&P (Signed)
PREOPERATIVE H&P  Chief Complaint: left knee degenerative joint disease  HPI: Cassandra Brooks is a 79 y.o. adult who presents for surgical treatment of left knee degenerative joint disease.  She denies any changes in medical history.  Past Medical History:  Diagnosis Date  . AAA (abdominal aortic aneurysm) ([place])   . Arthritis    "knees" (03/21/2017)  . Borderline diabetes   . Bulging lumbar disc   . Depression   . Disequilibrium   . Dizziness   . Fall   . Fibromyalgia   . GERD (gastroesophageal reflux disease)   . Glaucoma, both eyes   . Hemoptysis 03/18/2015  . High cholesterol    "don't have it but I take RX" (03/21/2017)  . Hypertension   . Nuclear cataract of both eyes 01/01/2014  . Pre-diabetes   . Primary osteoarthritis of left knee 07/18/2019  . Solitary pulmonary nodule 03/18/2015   03/2015 67mm RLL nodule   . Syncope 03/21/2017  . Syncope and collapse    Past Surgical History:  Procedure Laterality Date  . ABDOMINAL AORTIC ANEURYSM REPAIR  1966  . APPENDECTOMY  1966  . COLONOSCOPY    . VAGINAL HYSTERECTOMY     "partial"  . VIDEO BRONCHOSCOPY Bilateral 03/19/2015   Procedure: VIDEO BRONCHOSCOPY WITHOUT FLUORO;  Surgeon: Rigoberto Noel, MD;  Location: [facility];  Service: Cardiopulmonary;  Laterality: Bilateral;   Social History   Socioeconomic History  . Marital status: Married    Spouse name: Not on file  . Number of children: Not on file  . Years of education: Not on file  . Highest education level: Not on file  Occupational History  . Occupation: retired    Comment: Charity fundraiser; dietician  Tobacco Use  . Smoking status: Never Smoker  . Smokeless tobacco: Never Used  Vaping Use  . Vaping Use: Never used  Substance and Sexual Activity  . Alcohol use: No  . Drug use: No  . Sexual activity: Not on file  Other Topics Concern  . Not on file  Social History Narrative  . Not on file   Social Determinants of Health   Financial Resource Strain:  Not on file  Food Insecurity: Not on file  Transportation Needs: Not on file  Physical Activity: Not on file  Stress: Not on file  Social Connections: Not on file   Family History  Problem Relation Age of Onset  . Hypertension Father   . Stomach cancer Father   . Glaucoma Mother   . Diabetes Mother   . Diabetes Sister   . Diabetes Brother   . Lupus Child   . Diabetes Brother   . Lung cancer Brother   . Colon cancer Brother    No Known Allergies Prior to Admission medications   Medication Sig Start Date End Date Taking? Authorizing Provider  amLODipine (NORVASC) 10 MG tablet Take 10 mg by mouth daily.   Yes [provider]  baclofen (LIORESAL) 10 MG tablet TAKE 1 TABLET(10 MG) BY MOUTH THREE TIMES DAILY AS NEEDED FOR SPASM Patient taking differently: Take 10 mg by mouth 3 (three) times daily as needed for muscle spasms. 09/22/17  Yes Jessy Oto, MD  Calcium-Magnesium-Vitamin D (CALCIUM 1200+D3 PO) Take 1 tablet by mouth daily.   Yes [provider]  dorzolamide-timolol (COSOPT) 22.3-6.8 MG/ML ophthalmic solution Place 2 drops into both eyes daily.    Yes [provider]  fluticasone (FLONASE) 50 MCG/ACT nasal spray Place 2 sprays into both nostrils  2 (two) times daily. 11/08/19  Yes [provider]  gabapentin (NEURONTIN) 100 MG capsule Take 1 capsule (100 mg total) by mouth 2 (two) times daily with breakfast and lunch. Patient taking differently: Take 100 mg by mouth daily. 03/27/17  Yes Tammi Klippel, Sherin, DO  hydrochlorothiazide (HYDRODIURIL) 12.5 MG tablet Take 12.5 mg by mouth daily.  07/19/17  Yes [provider]  LUMIGAN 0.01 % SOLN Place 1 drop into both eyes at bedtime. 12/12/19  Yes [provider]  meclizine (ANTIVERT) 12.5 MG tablet Take 12.5 mg by mouth every 6 (six) hours as needed for dizziness. 09/07/19  Yes [provider]  Multiple Vitamin (MULTIVITAMIN) tablet Take 1 tablet by mouth daily.   Yes [provider]  nabumetone (RELAFEN) 750 MG tablet Take 750 mg by mouth 2 (two) times daily. 02/21/17  Yes [provider]  omeprazole (PRILOSEC) 20 MG capsule Take 20 mg by mouth daily.   Yes [provider]  pravastatin (PRAVACHOL) 20 MG tablet Take 20 mg by mouth daily.   Yes [provider]  traMADol-acetaminophen (ULTRACET) 37.5-325 MG tablet Take 1 tablet by mouth every 6 (six) hours as needed. 10/09/19  Yes Jessy Oto, MD  venlafaxine XR (EFFEXOR-XR) 150 MG 24 hr capsule Take 1 capsule (150 mg total) by mouth daily with breakfast. 03/28/17  Yes Caroline More, DO  acetaminophen (TYLENOL) 500 MG tablet Take 1,000 mg by mouth every 6 (six) hours as needed.    [provider]  aspirin EC 81 MG tablet Take 1 tablet (81 mg total) by mouth in the morning and at bedtime. To be taken post-op 03/23/20   Aundra Dubin, PA-C  cephALEXin (KEFLEX) 500 MG capsule Take 1 capsule (500 mg total) by mouth 4 (four) times daily. To be taken post-op 03/23/20   Aundra Dubin, PA-C  docusate sodium (COLACE) 100 MG capsule Take 1 capsule (100 mg total) by mouth daily as needed. 03/23/20 03/23/21  Aundra Dubin, PA-C  methocarbamol (ROBAXIN) 500 MG tablet Take 1 tablet (500 mg total) by mouth 2 (two) times daily as needed. To be taken post-op 03/23/20   Aundra Dubin, PA-C  ondansetron (ZOFRAN) 4 MG tablet Take 1 tablet (4 mg total) by mouth every 8 (eight) hours as needed for nausea or vomiting. 03/23/20   Aundra Dubin, PA-C  oxyCODONE-acetaminophen (PERCOCET) 5-325 MG tablet Take 1-2 tablets by mouth every 6 (six) hours as needed. To be taken post-op 03/23/20   Aundra Dubin, PA-C     Positive ROS: All other systems have been reviewed and were otherwise negative with the exception of those mentioned in the HPI and as above.  Physical Exam: General: Alert, no acute distress Cardiovascular: No pedal edema Respiratory: No cyanosis, no use of accessory  musculature GI: abdomen soft Skin: No lesions in the area of chief complaint Neurologic: Sensation intact distally Psychiatric: Patient is competent for consent with normal mood and affect Lymphatic: no lymphedema  MUSCULOSKELETAL: exam stable  Assessment: left knee degenerative joint disease  Plan: Plan for Procedure(s): LEFT TOTAL KNEE ARTHROPLASTY  The risks benefits and alternatives were discussed with the patient including but not limited to the risks of nonoperative treatment, versus surgical intervention including infection, bleeding, nerve injury,  blood clots, cardiopulmonary complications, morbidity, mortality, among others, and they were willing to proceed.   Preoperative templating of the joint replacement has been completed, documented, and submitted to the Operating Room personnel in order to optimize intra-operative  equipment management.   Eduard Roux, MD 03/27/2020 9:33 AM

## 2020-03-27 NOTE — Evaluation (Signed)
Physical Therapy Evaluation Patient Details Name: Cassandra Brooks MRN: [id number] DOB: [birth year] Today's Date: 03/27/2020   History of Present Illness  79 y.o. female with PMH including AAA, depression, fibromyalgia, glaucoma, syncope. Pt also has a history of L knee OA. Pt underwent L TKA on 03/27/2020.  Clinical Impression  Pt presents to PT with deficits in functional mobility, gait, sensation, strength, power, and endurance. Pt reports no pain during session, with ambulation tolerance being limited by LLE weakness. PT does not observe any knee buckling but pt reports her knee feels weak. Pt is able to mobilize in bed and transfer without physical assistance. Pt will benefit from progression of gait training and initiation of stair training to ensure a safe transition home.    Follow Up Recommendations Follow surgeon's recommendation for DC plan and follow-up therapies (pt reports she would prefer HHPT)    Equipment Recommendations  None recommended by PT (pt owns necessary DME)    Recommendations for Other Services       Precautions / Restrictions Precautions Precautions: Fall Restrictions Weight Bearing Restrictions: Yes LLE Weight Bearing: Weight bearing as tolerated      Mobility  Bed Mobility Overal bed mobility: Needs Assistance Bed Mobility: Supine to Sit;Sit to Supine     Supine to sit: Supervision Sit to supine: Supervision        Transfers Overall transfer level: Needs assistance Equipment used: Rolling walker (2 wheeled) Transfers: Sit to/from Stand Sit to Stand: Supervision            Ambulation/Gait Ambulation/Gait assistance: Min guard Gait Distance (Feet): 15 Feet Assistive device: Rolling walker (2 wheeled) Gait Pattern/deviations: Step-to pattern Gait velocity: reduced Gait velocity interpretation: <1.31 ft/sec, indicative of household ambulator General Gait Details: short step-to gait, reduced stance time on LLE  Stairs             Wheelchair Mobility    Modified Rankin (Stroke Patients Only)       Balance Overall balance assessment: Needs assistance Sitting-balance support: No upper extremity supported;Feet supported Sitting balance-Leahy Scale: Good     Standing balance support: Bilateral upper extremity supported Standing balance-Leahy Scale: Poor Standing balance comment: reliant on UE support of RW                             Pertinent Vitals/Pain Pain Assessment: No/denies pain    Home Living Family/patient expects to be discharged to:: Private residence Living Arrangements: Spouse/significant other Available Help at Discharge: Family;Available 24 hours/day Type of Home: House Home Access: Stairs to enter Entrance Stairs-Rails: Left Entrance Stairs-Number of Steps: 2 Home Layout: One level Home Equipment: Cane - single point;Walker - 2 wheels;Bedside commode      Prior Function Level of Independence: Independent               Hand Dominance        Extremity/Trunk Assessment   Upper Extremity Assessment Upper Extremity Assessment: Overall WFL for tasks assessed    Lower Extremity Assessment Lower Extremity Assessment: LLE deficits/detail LLE Deficits / Details: 4/5 knee extension, 4+/5 PF/DF LLE Sensation: decreased light touch    Cervical / Trunk Assessment Cervical / Trunk Assessment: Normal  Communication   Communication: No difficulties  Cognition Arousal/Alertness: Awake/alert Behavior During Therapy: WFL for tasks assessed/performed Overall Cognitive Status: Impaired/Different from baseline Area of Impairment: Memory  Memory: Decreased short-term memory                General Comments General comments (skin integrity, edema, etc.): VSS on RA    Exercises     Assessment/Plan    PT Assessment Patient needs continued PT services  PT Problem List Decreased strength;Decreased activity tolerance;Decreased  balance;Decreased mobility;Decreased knowledge of use of DME;Decreased knowledge of precautions;Pain       PT Treatment Interventions DME instruction;Gait training;Functional mobility training;Stair training;Therapeutic activities;Therapeutic exercise;Balance training;Patient/family education    PT Goals (Current goals can be found in the Care Plan section)  Acute Rehab PT Goals Patient Stated Goal: To return home and regain independence PT Goal Formulation: With patient/family Time For Goal Achievement: 04/01/20 Potential to Achieve Goals: Good    Frequency 7X/week   Barriers to discharge        Co-evaluation               AM-PAC PT "6 Clicks" Mobility  Outcome Measure Help needed turning from your back to your side while in a flat bed without using bedrails?: A Little Help needed moving from lying on your back to sitting on the side of a flat bed without using bedrails?: A Little Help needed moving to and from a bed to a chair (including a wheelchair)?: A Little Help needed standing up from a chair using your arms (e.g., wheelchair or bedside chair)?: A Little Help needed to walk in hospital room?: A Little Help needed climbing 3-5 steps with a railing? : A Lot 6 Click Score: 17    End of Session   Activity Tolerance: Patient tolerated treatment well Patient left: in bed;with call bell/phone within reach;with bed alarm set;in CPM Nurse Communication: Mobility status PT Visit Diagnosis: Other abnormalities of gait and mobility (R26.89)    Time: 9924-2683 PT Time Calculation (min) (ACUTE ONLY): 24 min   Charges:   PT Evaluation $PT Eval Low Complexity: [place], PT, DPT Acute Rehabilitation Pager: [phone]   Zenaida Niece 03/27/2020, 5:55 PM

## 2020-03-28 DIAGNOSIS — I10 Essential (primary) hypertension: Secondary | ICD-10-CM | POA: Diagnosis not present

## 2020-03-28 DIAGNOSIS — M1712 Unilateral primary osteoarthritis, left knee: Secondary | ICD-10-CM | POA: Diagnosis not present

## 2020-03-28 NOTE — Progress Notes (Signed)
Pt given discharge instructions and gone over with her. She verbalized understanding. All questions answered. Pt belongings gathered to be sent home with her. Daughter is picking her up.

## 2020-03-28 NOTE — TOC Transition Note (Signed)
Transition of Care [facility]) - CM/SW Discharge Note   Patient Details  Name: Cassandra Brooks MRN: [id number] Date of Birth: [birth year]  [facility]) CM/SW Contact:  Bartholomew Crews, RN Phone Number: [phone] 03/28/2020, 10:30 AM   Clinical Narrative:     Spoke with patient at bedside. Patient previously set up with DME and [facility] for [facility] PT. Spoke with Helene Kelp at [facility] to advise of transition home. No further TOC needs identified.   Final next level of care: [place] Barriers to Discharge: No Barriers Identified   Patient Goals and CMS Choice Patient states their goals for this hospitalization and ongoing recovery are:: home with home health PT CMS Medicare.gov Compare Post Acute Care list provided to:: Patient Choice offered to / list presented to : Patient  Discharge Placement                       Discharge Plan and Services                DME Arranged: 3-N-1 (Patient has a FWW and BSC/3in1 at home; CPM delivered on 03/26/20 by [employer]) DME Agency: [employer]       HH Arranged: PT [place] Agency: [facility] ([facility] [employer]) Date [place]: 03/28/20 Time [place]: 1030 Representative spoke with at [place]: [place] ([place]) Interventions     Readmission Risk Interventions No flowsheet data found.

## 2020-03-28 NOTE — Care Management Obs Status (Signed)
[place] NOTIFICATION   Patient Details  Name: Dalayla Aldredge MRN: [id number] Date of Birth: 07/10/1940   Medicare Observation Status Notification Given:  Yes    Bartholomew Crews, RN 03/28/2020, 10:25 AM

## 2020-03-28 NOTE — Progress Notes (Signed)
   Subjective: 1 Day Post-Op Procedure(s) (LRB): LEFT TOTAL KNEE ARTHROPLASTY (Left) Patient reports pain as mild.    Objective: Vital signs in last 24 hours: Temp:  [97.6 F (36.4 C)-98.5 F (36.9 C)] 97.8 F (36.6 C) (12/18 0821) Pulse Rate:  [56-72] 72 (12/18 0821) Resp:  [9-18] 17 (12/18 0821) BP: (116-208)/(56-164) 142/59 (12/18 0821) SpO2:  [93 %-100 %] 98 % (12/18 0821) Weight:  [74.8 kg] 74.8 kg (12/17 0950)  Intake/Output from previous day: 12/17 0701 - 12/18 0700 In: 1100 [I.V.:1000; IV Piggyback:100] Out: 2450 [Urine:2350; Blood:100] Intake/Output this shift: Total I/O In: 480 [P.O.:480] Out: -   No results for input(s): HGB in the last 72 hours. No results for input(s): WBC, RBC, HCT, PLT in the last 72 hours. No results for input(s): NA, K, CL, CO2, BUN, CREATININE, GLUCOSE, CALCIUM in the last 72 hours. No results for input(s): LABPT, INR in the last 72 hours.  Neurologically intact DG Knee Left Port  Result Date: 03/27/2020 CLINICAL DATA:  Status post total knee replacement EXAM: PORTABLE LEFT KNEE - 4 VIEW COMPARISON:  July 18, 2019 FINDINGS: Frontal, lateral, and bilateral oblique views were obtained. Patient is status post total knee replacement with femoral and tibial prosthetic components well-seated. No fracture or dislocation. No erosion. There is air in the soft tissues consistent with recent surgery. IMPRESSION: Total knee replacement with prosthetic components well-seated. No fracture or dislocation. Acute postoperative changes noted. Electronically Signed   By: Lowella Grip III M.D.   On: 03/27/2020 15:30    Assessment/Plan: 1 Day Post-Op Procedure(s) (LRB): LEFT TOTAL KNEE ARTHROPLASTY (Left) Discharge home with home health. Flexed 100, can SLR, did stairs.   Cassandra Brooks 03/28/2020, 9:35 AM

## 2020-03-28 NOTE — Progress Notes (Signed)
Physical Therapy Treatment Patient Details Name: Cassandra Brooks MRN: [id number] DOB: 09/09/1940 Today's Date: 03/28/2020    History of Present Illness 79 y.o. female with PMH including AAA, depression, fibromyalgia, glaucoma, syncope. Pt also has a history of L knee OA. Pt underwent L TKA on 03/27/2020.    PT Comments    Patient progressing towards physical therapy goals with improved ambulation distance and stair negotiation. Patient overall supervision for mobility for safety. Patient continues to be limited by decreased L knee ROM, generalized weakness, impaired balance, decreased activity tolerance. Patient will continue to benefit from skilled PT services in the acute setting and following discharge.      Follow Up Recommendations  Follow surgeons recommendation for DC plan and follow-up therapies     Equipment Recommendations  None recommended by PT (pt owns necessary DME)    Recommendations for Other Services       Precautions / Restrictions Precautions Precautions: Fall Restrictions Weight Bearing Restrictions: Yes LLE Weight Bearing: Weight bearing as tolerated    Mobility  Bed Mobility Overal bed mobility: Needs Assistance Bed Mobility: Supine to Sit;Sit to Supine     Supine to sit: Supervision Sit to supine: Supervision      Transfers Overall transfer level: Needs assistance Equipment used: Rolling Nael Petrosyan (2 wheeled) Transfers: Sit to/from Stand Sit to Stand: Supervision            Ambulation/Gait Ambulation/Gait assistance: Supervision Gait Distance (Feet): 120 Feet Assistive device: Rolling Yasir Kitner (2 wheeled) Gait Pattern/deviations: Step-to pattern Gait velocity: reduced       Stairs Stairs: Yes Stairs assistance: Supervision Stair Management: One rail Left;Step to pattern;Forwards Number of Stairs: 2     Wheelchair Mobility    Modified Rankin (Stroke Patients Only)       Balance Overall balance assessment: Needs  assistance Sitting-balance support: No upper extremity supported;Feet supported Sitting balance-Leahy Scale: Good     Standing balance support: Bilateral upper extremity supported Standing balance-Leahy Scale: Poor Standing balance comment: reliant on UE support                            Cognition Arousal/Alertness: Awake/alert Behavior During Therapy: WFL for tasks assessed/performed Overall Cognitive Status: No family/caregiver present to determine baseline cognitive functioning                                        Exercises Total Joint Exercises Goniometric ROM: L knee flexion:100 degrees; Extension: lacking 6 degrees    General Comments        Pertinent Vitals/Pain Pain Assessment: No/denies pain    Home Living                      Prior Function            PT Goals (current goals can now be found in the care plan section) Acute Rehab PT Goals Patient Stated Goal: To return home and regain independence PT Goal Formulation: With patient Time For Goal Achievement: 04/01/20 Potential to Achieve Goals: Good Progress towards PT goals: Progressing toward goals    Frequency    7X/week      PT Plan Current plan remains appropriate    Co-evaluation              AM-PAC PT "6 Clicks" Mobility   Outcome Measure  Help needed turning  from your back to your side while in a flat bed without using bedrails?: A Little Help needed moving from lying on your back to sitting on the side of a flat bed without using bedrails?: A Little Help needed moving to and from a bed to a chair (including a wheelchair)?: A Little Help needed standing up from a chair using your arms (e.g., wheelchair or bedside chair)?: A Little Help needed to walk in hospital room?: A Little Help needed climbing 3-5 steps with a railing? : A Little 6 Click Score: 18    End of Session Equipment Utilized During Treatment: Gait belt Activity Tolerance:  Patient tolerated treatment well Patient left: in chair;with call bell/phone within reach Nurse Communication: Mobility status PT Visit Diagnosis: Other abnormalities of gait and mobility (R26.89)     Time: 5643-3295 PT Time Calculation (min) (ACUTE ONLY): 28 min  Charges:  $Therapeutic Activity: 23-37 mins                     Perrin Maltese, PT, DPT Acute Rehabilitation Services Pager [phone] Office [phone]    Cassandra Brooks 03/28/2020, 9:29 AM

## 2020-03-28 NOTE — Plan of Care (Signed)
  Problem: Activity: Goal: Risk for activity intolerance will decrease Outcome: Adequate for Discharge   Problem: Pain Managment: Goal: General experience of comfort will improve Outcome: Adequate for Discharge   Problem: Safety: Goal: Ability to remain free from injury will improve Outcome: Adequate for Discharge   

## 2020-03-28 NOTE — Discharge Instructions (Addendum)

## 2020-03-29 DIAGNOSIS — H409 Unspecified glaucoma: Secondary | ICD-10-CM | POA: Diagnosis not present

## 2020-03-29 DIAGNOSIS — K219 Gastro-esophageal reflux disease without esophagitis: Secondary | ICD-10-CM | POA: Diagnosis not present

## 2020-03-29 DIAGNOSIS — Z7982 Long term (current) use of aspirin: Secondary | ICD-10-CM | POA: Diagnosis not present

## 2020-03-29 DIAGNOSIS — E785 Hyperlipidemia, unspecified: Secondary | ICD-10-CM | POA: Diagnosis not present

## 2020-03-29 DIAGNOSIS — M797 Fibromyalgia: Secondary | ICD-10-CM | POA: Diagnosis not present

## 2020-03-29 DIAGNOSIS — H2513 Age-related nuclear cataract, bilateral: Secondary | ICD-10-CM | POA: Diagnosis not present

## 2020-03-29 DIAGNOSIS — Z96652 Presence of left artificial knee joint: Secondary | ICD-10-CM | POA: Diagnosis not present

## 2020-03-29 DIAGNOSIS — I10 Essential (primary) hypertension: Secondary | ICD-10-CM | POA: Diagnosis not present

## 2020-03-29 DIAGNOSIS — M5126 Other intervertebral disc displacement, lumbar region: Secondary | ICD-10-CM | POA: Diagnosis not present

## 2020-03-29 DIAGNOSIS — K5900 Constipation, unspecified: Secondary | ICD-10-CM | POA: Diagnosis not present

## 2020-03-29 DIAGNOSIS — R911 Solitary pulmonary nodule: Secondary | ICD-10-CM | POA: Diagnosis not present

## 2020-03-29 DIAGNOSIS — R7303 Prediabetes: Secondary | ICD-10-CM | POA: Diagnosis not present

## 2020-03-29 DIAGNOSIS — M1711 Unilateral primary osteoarthritis, right knee: Secondary | ICD-10-CM | POA: Diagnosis not present

## 2020-03-29 DIAGNOSIS — F32A Depression, unspecified: Secondary | ICD-10-CM | POA: Diagnosis not present

## 2020-03-29 DIAGNOSIS — Z471 Aftercare following joint replacement surgery: Secondary | ICD-10-CM | POA: Diagnosis not present

## 2020-03-30 ENCOUNTER — Encounter (HOSPITAL_COMMUNITY): Payer: Self-pay | Admitting: Orthopaedic Surgery

## 2020-03-30 ENCOUNTER — Other Ambulatory Visit: Payer: Self-pay | Admitting: Physician Assistant

## 2020-03-30 ENCOUNTER — Telehealth: Payer: Self-pay | Admitting: *Deleted

## 2020-03-30 MED ORDER — CEPHALEXIN 500 MG PO CAPS: 500.0000 mg | ORAL_CAPSULE | Freq: Four times a day (QID) | ORAL | 0 refills | Status: DC

## 2020-03-30 NOTE — Telephone Encounter (Signed)
D/C call to patient. They verbalized they never got the Keflex. It never made it to her pharmacy. Would you send in please?Their pharmacy changed and checked with both to make sure. Also, on D/C paperwork there is Baclofen listed as a previous medication, but Robaxin also ordered. She should only be taking Robaxin from [place] correct? Just checking for clarification. Thanks.

## 2020-03-30 NOTE — Discharge Summary (Signed)
Patient ID: Cassandra Brooks MRN: [id number] DOB/AGE: 1940-12-04 79 y.o.  Admit date: 03/27/2020 Discharge date: 03/30/2020  Admission Diagnoses:  Active Problems:   Status post total left knee replacement   Discharge Diagnoses:  Same  Past Medical History:  Diagnosis Date  . AAA (abdominal aortic aneurysm) ([place])   . Arthritis    "knees" (03/21/2017)  . Borderline diabetes   . Bulging lumbar disc   . Depression   . Disequilibrium   . Dizziness   . Fall   . Fibromyalgia   . GERD (gastroesophageal reflux disease)   . Glaucoma, both eyes   . Hemoptysis 03/18/2015  . High cholesterol    "don't have it but I take RX" (03/21/2017)  . Hypertension   . Nuclear cataract of both eyes 01/01/2014  . Pre-diabetes   . Primary osteoarthritis of left knee 07/18/2019  . Solitary pulmonary nodule 03/18/2015   03/2015 8mm RLL nodule   . Syncope 03/21/2017  . Syncope and collapse     Surgeries: Procedure(s): LEFT TOTAL KNEE ARTHROPLASTY on 03/27/2020   Consultants:   Discharged Condition: Improved  Hospital Course: Cassandra Brooks is an 79 y.o. adult who was admitted 03/27/2020 for operative treatment of<principal problem not specified>. Patient has severe unremitting pain that affects sleep, daily activities, and work/hobbies. After pre-op clearance the patient was taken to the operating room on 03/27/2020 and underwent  Procedure(s): LEFT TOTAL KNEE ARTHROPLASTY.    Patient was given perioperative antibiotics:  Anti-infectives (From admission, onward)   Start     Dose/Rate Route Frequency Ordered Stop   03/27/20 1800  ceFAZolin (ANCEF) IVPB 2g/100 mL premix        2 g 200 mL/hr over 30 Minutes Intravenous Every 6 hours 03/27/20 1646 03/28/20 0121   03/27/20 1331  vancomycin (VANCOCIN) powder  Status:  Discontinued          As needed 03/27/20 1332 03/27/20 1435   03/27/20 0930  ceFAZolin (ANCEF) IVPB 2g/100 mL premix        2 g 200 mL/hr over 30 Minutes Intravenous On call to  O.R. 03/27/20 0927 03/27/20 1228       Patient was given sequential compression devices, early ambulation, and chemoprophylaxis to prevent DVT.  Patient benefited maximally from hospital stay and there were no complications.    Recent vital signs: No data found.   Recent laboratory studies: No results for input(s): WBC, HGB, HCT, PLT, NA, K, CL, CO2, BUN, CREATININE, GLUCOSE, INR, CALCIUM in the last 72 hours.  Invalid input(s): PT, 2   Discharge Medications:   Allergies as of 03/28/2020   No Known Allergies     Medication List    TAKE these medications   acetaminophen 500 MG tablet Commonly known as: TYLENOL Take 1,000 mg by mouth every 6 (six) hours as needed.   amLODipine 10 MG tablet Commonly known as: NORVASC Take 10 mg by mouth daily.   aspirin EC 81 MG tablet Take 1 tablet (81 mg total) by mouth in the morning and at bedtime. To be taken post-op   baclofen 10 MG tablet Commonly known as: LIORESAL TAKE 1 TABLET(10 MG) BY MOUTH THREE TIMES DAILY AS NEEDED FOR SPASM What changed: See the new instructions.   CALCIUM 1200+D3 PO Take 1 tablet by mouth daily.   cephALEXin 500 MG capsule Commonly known as: Keflex Take 1 capsule (500 mg total) by mouth 4 (four) times daily. To be taken post-op   docusate sodium 100 MG capsule Commonly known as:  Colace Take 1 capsule (100 mg total) by mouth daily as needed.   dorzolamide-timolol 22.3-6.8 MG/ML ophthalmic solution Commonly known as: COSOPT Place 2 drops into both eyes daily.   fluticasone 50 MCG/ACT nasal spray Commonly known as: FLONASE Place 2 sprays into both nostrils 2 (two) times daily.   gabapentin 100 MG capsule Commonly known as: NEURONTIN Take 1 capsule (100 mg total) by mouth 2 (two) times daily with breakfast and lunch. What changed: when to take this   hydrochlorothiazide 12.5 MG tablet Commonly known as: HYDRODIURIL Take 12.5 mg by mouth daily.   Lumigan 0.01 % Soln Generic drug:  bimatoprost Place 1 drop into both eyes at bedtime.   meclizine 12.5 MG tablet Commonly known as: ANTIVERT Take 12.5 mg by mouth every 6 (six) hours as needed for dizziness.   methocarbamol 500 MG tablet Commonly known as: Robaxin Take 1 tablet (500 mg total) by mouth 2 (two) times daily as needed. To be taken post-op   multivitamin tablet Take 1 tablet by mouth daily.   nabumetone 750 MG tablet Commonly known as: RELAFEN Take 750 mg by mouth 2 (two) times daily.   omeprazole 20 MG capsule Commonly known as: PRILOSEC Take 20 mg by mouth daily.   ondansetron 4 MG tablet Commonly known as: Zofran Take 1 tablet (4 mg total) by mouth every 8 (eight) hours as needed for nausea or vomiting.   oxyCODONE-acetaminophen 5-325 MG tablet Commonly known as: Percocet Take 1-2 tablets by mouth every 6 (six) hours as needed. To be taken post-op   pravastatin 20 MG tablet Commonly known as: PRAVACHOL Take 20 mg by mouth daily.   traMADol-acetaminophen 37.5-325 MG tablet Commonly known as: ULTRACET Take 1 tablet by mouth every 6 (six) hours as needed.   venlafaxine XR 150 MG 24 hr capsule Commonly known as: EFFEXOR-XR Take 1 capsule (150 mg total) by mouth daily with breakfast.       Diagnostic Studies: DG Knee Left Port  Result Date: 03/27/2020 CLINICAL DATA:  Status post total knee replacement EXAM: PORTABLE LEFT KNEE - 4 VIEW COMPARISON:  July 18, 2019 FINDINGS: Frontal, lateral, and bilateral oblique views were obtained. Patient is status post total knee replacement with femoral and tibial prosthetic components well-seated. No fracture or dislocation. No erosion. There is air in the soft tissues consistent with recent surgery. IMPRESSION: Total knee replacement with prosthetic components well-seated. No fracture or dislocation. Acute postoperative changes noted. Electronically Signed   By: Lowella Grip III M.D.   On: 03/27/2020 15:30   XR C-ARM NO REPORT  Result Date:  03/02/2020 Please see Notes tab for imaging impression.  XR Shoulder Right  Result Date: 03/24/2020 Generalized osteopenia.  No acute abnormalities.  Mild glenohumeral osteoarthritis.  Small calcification near the [facility] joint.   Disposition: Discharge disposition: 01-Home or Self Care          Follow-up Information    Leandrew Koyanagi, MD. Go on 04/09/2020.   Specialty: Orthopedic Surgery Why: at 9:30 am For suture removal, For wound re-check Contact information: [place] [address] [phone]        Home, [facility] Follow up.   Specialty: [place] Why: the office will follow up to schedule physical therapy visits at home Contact information: [place] [address] [phone]                Signed: Aundra Dubin 03/30/2020, 9:14 AM

## 2020-03-30 NOTE — Telephone Encounter (Signed)
Just resent keflex.  Do not take robaxin if taking baclofen or vice versa

## 2020-04-01 ENCOUNTER — Other Ambulatory Visit: Payer: Self-pay

## 2020-04-01 ENCOUNTER — Telehealth: Payer: Self-pay

## 2020-04-01 ENCOUNTER — Ambulatory Visit: Payer: Medicare Other | Admitting: Surgery

## 2020-04-01 DIAGNOSIS — I10 Essential (primary) hypertension: Secondary | ICD-10-CM | POA: Diagnosis not present

## 2020-04-01 DIAGNOSIS — Z471 Aftercare following joint replacement surgery: Secondary | ICD-10-CM | POA: Diagnosis not present

## 2020-04-01 DIAGNOSIS — M797 Fibromyalgia: Secondary | ICD-10-CM | POA: Diagnosis not present

## 2020-04-01 DIAGNOSIS — F32A Depression, unspecified: Secondary | ICD-10-CM | POA: Diagnosis not present

## 2020-04-01 DIAGNOSIS — M1711 Unilateral primary osteoarthritis, right knee: Secondary | ICD-10-CM | POA: Diagnosis not present

## 2020-04-01 DIAGNOSIS — K219 Gastro-esophageal reflux disease without esophagitis: Secondary | ICD-10-CM | POA: Diagnosis not present

## 2020-04-01 MED ORDER — CEPHALEXIN 500 MG PO CAPS: 500.0000 mg | ORAL_CAPSULE | Freq: Four times a day (QID) | ORAL | 0 refills | Status: DC

## 2020-04-01 NOTE — Telephone Encounter (Signed)
Patients daughter came in she stated patient had surgery 12/17 she was supposed to be prescribed Cephalexin 500MG  patient hasn't received rx. CB:[phone]

## 2020-04-02 ENCOUNTER — Telehealth: Payer: Self-pay | Admitting: *Deleted

## 2020-04-02 NOTE — Telephone Encounter (Signed)
Ortho bundle 7 day call completed. 

## 2020-04-03 DIAGNOSIS — I10 Essential (primary) hypertension: Secondary | ICD-10-CM | POA: Diagnosis not present

## 2020-04-03 DIAGNOSIS — M1711 Unilateral primary osteoarthritis, right knee: Secondary | ICD-10-CM | POA: Diagnosis not present

## 2020-04-03 DIAGNOSIS — M797 Fibromyalgia: Secondary | ICD-10-CM | POA: Diagnosis not present

## 2020-04-03 DIAGNOSIS — K219 Gastro-esophageal reflux disease without esophagitis: Secondary | ICD-10-CM | POA: Diagnosis not present

## 2020-04-03 DIAGNOSIS — Z471 Aftercare following joint replacement surgery: Secondary | ICD-10-CM | POA: Diagnosis not present

## 2020-04-03 DIAGNOSIS — F32A Depression, unspecified: Secondary | ICD-10-CM | POA: Diagnosis not present

## 2020-04-06 DIAGNOSIS — K219 Gastro-esophageal reflux disease without esophagitis: Secondary | ICD-10-CM | POA: Diagnosis not present

## 2020-04-06 DIAGNOSIS — M1711 Unilateral primary osteoarthritis, right knee: Secondary | ICD-10-CM | POA: Diagnosis not present

## 2020-04-06 DIAGNOSIS — M797 Fibromyalgia: Secondary | ICD-10-CM | POA: Diagnosis not present

## 2020-04-06 DIAGNOSIS — Z471 Aftercare following joint replacement surgery: Secondary | ICD-10-CM | POA: Diagnosis not present

## 2020-04-06 DIAGNOSIS — F32A Depression, unspecified: Secondary | ICD-10-CM | POA: Diagnosis not present

## 2020-04-06 DIAGNOSIS — I10 Essential (primary) hypertension: Secondary | ICD-10-CM | POA: Diagnosis not present

## 2020-04-08 DIAGNOSIS — Z471 Aftercare following joint replacement surgery: Secondary | ICD-10-CM | POA: Diagnosis not present

## 2020-04-08 DIAGNOSIS — K219 Gastro-esophageal reflux disease without esophagitis: Secondary | ICD-10-CM | POA: Diagnosis not present

## 2020-04-08 DIAGNOSIS — M1711 Unilateral primary osteoarthritis, right knee: Secondary | ICD-10-CM | POA: Diagnosis not present

## 2020-04-08 DIAGNOSIS — F32A Depression, unspecified: Secondary | ICD-10-CM | POA: Diagnosis not present

## 2020-04-08 DIAGNOSIS — M797 Fibromyalgia: Secondary | ICD-10-CM | POA: Diagnosis not present

## 2020-04-08 DIAGNOSIS — I10 Essential (primary) hypertension: Secondary | ICD-10-CM | POA: Diagnosis not present

## 2020-04-09 ENCOUNTER — Telehealth: Payer: Self-pay | Admitting: *Deleted

## 2020-04-09 ENCOUNTER — Other Ambulatory Visit: Payer: Self-pay | Admitting: Physician Assistant

## 2020-04-09 ENCOUNTER — Other Ambulatory Visit: Payer: Self-pay

## 2020-04-09 ENCOUNTER — Encounter: Payer: Self-pay | Admitting: Orthopaedic Surgery

## 2020-04-09 ENCOUNTER — Ambulatory Visit (INDEPENDENT_AMBULATORY_CARE_PROVIDER_SITE_OTHER): Payer: Medicare Other | Admitting: Physician Assistant

## 2020-04-09 DIAGNOSIS — Z96652 Presence of left artificial knee joint: Secondary | ICD-10-CM

## 2020-04-09 MED ORDER — METHOCARBAMOL 500 MG PO TABS: 500.0000 mg | ORAL_TABLET | Freq: Two times a day (BID) | ORAL | 0 refills | Status: DC | PRN

## 2020-04-09 NOTE — Telephone Encounter (Signed)
14 day Ortho bundle call completed.  

## 2020-04-09 NOTE — Progress Notes (Signed)
Post-Op Visit Note   Patient: Cassandra Brooks           Date of Birth: [birth year]           MRN: [id number] Visit Date: 04/09/2020 PCP: Lowella Dandy, NP   Assessment & Plan:  Chief Complaint:  Chief Complaint  Patient presents with  . Left Knee - Routine Post Op   Visit Diagnoses:  1. Hx of total knee replacement, left     Plan: Patient is a very pleasant 79 year old female who comes in today 2 weeks out left total knee replacement.  She has been doing well.  She has minimal pain which is relieved with Tylenol and Robaxin.  She just finished home health physical therapy where she is made great progress.  She is ambulating primarily with a walker but has tried to walk with a cane at home without any issues.  Examination of her left knee reveals range of motion from 0 to 120 degrees.  She does have a well-healing incision with nylon sutures in place.  No evidence of infection or cellulitis.  Calf is soft nontender.  At this point, sutures were removed and Steri-Strips applied.  Due to financial constraints she would like to avoid formal physical therapy if possible and really work hard at home exercise program.  I think this is okay for her to do given that she has progressed very well up into this point.  We will see her back in 4 weeks time for repeat evaluation and 2 view x-rays of the left knee.  Dental prophylaxis reinforced.  Call with concerns or questions.  Follow-Up Instructions: Return in about 4 weeks (around 05/07/2020).   Orders:  No orders of the defined types were placed in this encounter.  Meds ordered this encounter  Medications  . methocarbamol (ROBAXIN) 500 MG tablet    Sig: Take 1 tablet (500 mg total) by mouth 2 (two) times daily as needed.    Dispense:  40 tablet    Refill:  0    Imaging: No new imaging  PMFS History: Patient Active Problem List   Diagnosis Date Noted  . Status post total left knee replacement 03/27/2020  . Essential hypertension  12/19/2019  . Mixed dyslipidemia 12/19/2019  . Diabetes mellitus due to underlying condition with unspecified complications ([place]) 0000000  . Preoperative cardiovascular examination 12/19/2019  . Status post AAA (abdominal aortic aneurysm) repair 12/19/2019  . Syncope and collapse   . Hypertension   . High cholesterol   . Glaucoma, both eyes   . GERD (gastroesophageal reflux disease)   . Fibromyalgia   . Depression   . Bulging lumbar disc   . Borderline diabetes   . Arthritis   . AAA (abdominal aortic aneurysm) ([place])   . Primary osteoarthritis of left knee 07/18/2019  . Disequilibrium   . Syncope 03/21/2017  . Dizziness   . Fall   . Hemoptysis 03/18/2015  . Solitary pulmonary nodule 03/18/2015  . Nuclear cataract of both eyes 01/01/2014   Past Medical History:  Diagnosis Date  . AAA (abdominal aortic aneurysm) ([place])   . Arthritis    "knees" (03/21/2017)  . Borderline diabetes   . Bulging lumbar disc   . Depression   . Disequilibrium   . Dizziness   . Fall   . Fibromyalgia   . GERD (gastroesophageal reflux disease)   . Glaucoma, both eyes   . Hemoptysis 03/18/2015  . High cholesterol    "don't have it but  I take RX" (03/21/2017)  . Hypertension   . Nuclear cataract of both eyes 01/01/2014  . Pre-diabetes   . Primary osteoarthritis of left knee 07/18/2019  . Solitary pulmonary nodule 03/18/2015   03/2015 27mm RLL nodule   . Syncope 03/21/2017  . Syncope and collapse     Family History  Problem Relation Age of Onset  . Hypertension Father   . Stomach cancer Father   . Glaucoma Mother   . Diabetes Mother   . Diabetes Sister   . Diabetes Brother   . Lupus Child   . Diabetes Brother   . Lung cancer Brother   . Colon cancer Brother     Past Surgical History:  Procedure Laterality Date  . ABDOMINAL AORTIC ANEURYSM REPAIR  1966  . APPENDECTOMY  1966  . COLONOSCOPY    . TOTAL KNEE ARTHROPLASTY Left 03/27/2020   Procedure: LEFT TOTAL KNEE ARTHROPLASTY;  Surgeon:  Tarry Kos, MD;  Location: [facility];  Service: Orthopedics;  Laterality: Left;  Marland Kitchen VAGINAL HYSTERECTOMY     "partial"  . VIDEO BRONCHOSCOPY Bilateral 03/19/2015   Procedure: VIDEO BRONCHOSCOPY WITHOUT FLUORO;  Surgeon: Oretha Milch, MD;  Location: [facility];  Service: Cardiopulmonary;  Laterality: Bilateral;   Social History   Occupational History  . Occupation: retired    Comment: Designer, fashion/clothing; dietician  Tobacco Use  . Smoking status: Never Smoker  . Smokeless tobacco: Never Used  Vaping Use  . Vaping Use: Never used  Substance and Sexual Activity  . Alcohol use: No  . Drug use: No  . Sexual activity: Not on file

## 2020-04-17 DIAGNOSIS — Z6832 Body mass index (BMI) 32.0-32.9, adult: Secondary | ICD-10-CM | POA: Diagnosis not present

## 2020-04-17 DIAGNOSIS — F325 Major depressive disorder, single episode, in full remission: Secondary | ICD-10-CM | POA: Diagnosis not present

## 2020-04-17 DIAGNOSIS — M1990 Unspecified osteoarthritis, unspecified site: Secondary | ICD-10-CM | POA: Diagnosis not present

## 2020-04-17 DIAGNOSIS — E785 Hyperlipidemia, unspecified: Secondary | ICD-10-CM | POA: Diagnosis not present

## 2020-04-17 DIAGNOSIS — K219 Gastro-esophageal reflux disease without esophagitis: Secondary | ICD-10-CM | POA: Diagnosis not present

## 2020-04-17 DIAGNOSIS — I10 Essential (primary) hypertension: Secondary | ICD-10-CM | POA: Diagnosis not present

## 2020-04-17 DIAGNOSIS — R7301 Impaired fasting glucose: Secondary | ICD-10-CM | POA: Diagnosis not present

## 2020-04-17 DIAGNOSIS — F411 Generalized anxiety disorder: Secondary | ICD-10-CM | POA: Diagnosis not present

## 2020-04-28 DIAGNOSIS — Z1231 Encounter for screening mammogram for malignant neoplasm of breast: Secondary | ICD-10-CM | POA: Diagnosis not present

## 2020-05-04 ENCOUNTER — Telehealth: Payer: Self-pay | Admitting: *Deleted

## 2020-05-04 NOTE — Telephone Encounter (Signed)
Ortho bundle 30 day call completed. °

## 2020-05-07 ENCOUNTER — Ambulatory Visit (INDEPENDENT_AMBULATORY_CARE_PROVIDER_SITE_OTHER): Payer: Medicare Other

## 2020-05-07 ENCOUNTER — Encounter: Payer: Self-pay | Admitting: Orthopaedic Surgery

## 2020-05-07 ENCOUNTER — Other Ambulatory Visit: Payer: Self-pay

## 2020-05-07 ENCOUNTER — Ambulatory Visit (INDEPENDENT_AMBULATORY_CARE_PROVIDER_SITE_OTHER): Payer: Medicare Other | Admitting: Orthopaedic Surgery

## 2020-05-07 DIAGNOSIS — Z96652 Presence of left artificial knee joint: Secondary | ICD-10-CM

## 2020-05-07 NOTE — Progress Notes (Signed)
Post-Op Visit Note   Patient: Cassandra Brooks           Date of Birth: Feb 25, 1941           MRN: [id number] Visit Date: 05/07/2020 PCP: Cassandra Dandy, NP   Assessment & Plan:  Chief Complaint:  Chief Complaint  Patient presents with  . Left Knee - Pain   Visit Diagnoses:  1. Hx of total knee replacement, left     Plan:   [name] is 6-week status post left total knee replacement.  Overall doing well with some mild pain at times.  She did not go to physical therapy after the initial home health PT because she has been just doing exercises on her own.  She has progressed very well on her own.  No complaints.  Examination of the left knee shows healed surgical scar.  Range of motion is 10 to 110 degrees.  Stable to varus valgus.  No signs of infection.  Minimal swelling.  X-rays demonstrate stable total knee replacement without complications.  She is ambulating well without any assistive devices.  Impression 6-week status post left total knee replacement.  Overall doing very well.  She will continue with home exercises.  She may return back to her baseline aspirin dose.  Dental prophylaxis reinforced.  Recheck in 6 weeks.  Follow-Up Instructions: Return in about 6 weeks (around 06/18/2020).   Orders:  Orders Placed This Encounter  Procedures  . XR KNEE 3 VIEW LEFT   No orders of the defined types were placed in this encounter.   Imaging: XR KNEE 3 VIEW LEFT  Result Date: 05/07/2020 Stable total knee replacement without complication   PMFS History: Patient Active Problem List   Diagnosis Date Noted  . Status post total left knee replacement 03/27/2020  . Essential hypertension 12/19/2019  . Mixed dyslipidemia 12/19/2019  . Diabetes mellitus due to underlying condition with unspecified complications ([place]) 82/95/6213  . Preoperative cardiovascular examination 12/19/2019  . Status post AAA (abdominal aortic aneurysm) repair 12/19/2019  . Syncope and collapse   .  Hypertension   . High cholesterol   . Glaucoma, both eyes   . GERD (gastroesophageal reflux disease)   . Fibromyalgia   . Depression   . Bulging lumbar disc   . Borderline diabetes   . Arthritis   . AAA (abdominal aortic aneurysm) ([place])   . Primary osteoarthritis of left knee 07/18/2019  . Disequilibrium   . Syncope 03/21/2017  . Dizziness   . Fall   . Hemoptysis 03/18/2015  . Solitary pulmonary nodule 03/18/2015  . Nuclear cataract of both eyes 01/01/2014   Past Medical History:  Diagnosis Date  . AAA (abdominal aortic aneurysm) ([place])   . Arthritis    "knees" (03/21/2017)  . Borderline diabetes   . Bulging lumbar disc   . Depression   . Disequilibrium   . Dizziness   . Fall   . Fibromyalgia   . GERD (gastroesophageal reflux disease)   . Glaucoma, both eyes   . Hemoptysis 03/18/2015  . High cholesterol    "don't have it but I take RX" (03/21/2017)  . Hypertension   . Nuclear cataract of both eyes 01/01/2014  . Pre-diabetes   . Primary osteoarthritis of left knee 07/18/2019  . Solitary pulmonary nodule 03/18/2015   03/2015 29mm RLL nodule   . Syncope 03/21/2017  . Syncope and collapse     Family History  Problem Relation Age of Onset  . Hypertension Father   .  Stomach cancer Father   . Glaucoma Mother   . Diabetes Mother   . Diabetes Sister   . Diabetes Brother   . Lupus Child   . Diabetes Brother   . Lung cancer Brother   . Colon cancer Brother     Past Surgical History:  Procedure Laterality Date  . ABDOMINAL AORTIC ANEURYSM REPAIR  1966  . APPENDECTOMY  1966  . COLONOSCOPY    . TOTAL KNEE ARTHROPLASTY Left 03/27/2020   Procedure: LEFT TOTAL KNEE ARTHROPLASTY;  Surgeon: Leandrew Koyanagi, MD;  Location: [facility];  Service: Orthopedics;  Laterality: Left;  Marland Kitchen VAGINAL HYSTERECTOMY     "partial"  . VIDEO BRONCHOSCOPY Bilateral 03/19/2015   Procedure: VIDEO BRONCHOSCOPY WITHOUT FLUORO;  Surgeon: Rigoberto Noel, MD;  Location: [facility];  Service: Cardiopulmonary;   Laterality: Bilateral;   Social History   Occupational History  . Occupation: retired    Comment: Charity fundraiser; dietician  Tobacco Use  . Smoking status: Never Smoker  . Smokeless tobacco: Never Used  Vaping Use  . Vaping Use: Never used  Substance and Sexual Activity  . Alcohol use: No  . Drug use: No  . Sexual activity: Not on file

## 2020-05-11 NOTE — Progress Notes (Signed)
Cassandra Brooks - 80 y.o. adult MRN [id number]  Date of birth: Apr 02, 1941  Office Visit Note: Visit Date: 03/02/2020 PCP: Lowella Dandy, NP Referred by: Lowella Dandy, NP  Subjective: Chief Complaint  Patient presents with  . Lower Back - Pain   HPI:  Cassandra Brooks is a 80 y.o. adult who comes in today at the request of Dr. Basil Dess for planned Bilateral L3-L4 Lumbar epidural steroid injection with fluoroscopic guidance.  The patient has failed conservative care including home exercise, medications, time and activity modification.  This injection will be diagnostic and hopefully therapeutic.  Please see requesting physician notes for further details and justification.   ROS Otherwise per HPI.  Assessment & Plan: Visit Diagnoses:    ICD-10-CM   1. Lumbar radiculopathy  M54.16 XR C-ARM NO REPORT    Epidural Steroid injection    methylPREDNISolone acetate (DEPO-MEDROL) injection 80 mg    Plan: No additional findings.   Meds & Orders:  Meds ordered this encounter  Medications  . methylPREDNISolone acetate (DEPO-MEDROL) injection 80 mg    Orders Placed This Encounter  Procedures  . XR C-ARM NO REPORT  . Epidural Steroid injection    Follow-up: Return if symptoms worsen or fail to improve.   Procedures: No procedures performed  Lumbosacral Transforaminal Epidural Steroid Injection - Sub-Pedicular Approach with Fluoroscopic Guidance  Patient: Cassandra Brooks      Date of Birth: 06-Dec-1940 MRN: [id number] PCP: Lowella Dandy, NP      Visit Date: 03/02/2020   Universal Protocol:    Date/Time: 03/02/2020  Consent Given By: the patient  Position: PRONE  Additional Comments: Vital signs were monitored before and after the procedure. Patient was prepped and draped in the usual sterile fashion. The correct patient, procedure, and site was verified.   Injection Procedure Details:   Procedure diagnoses:  1. Lumbar radiculopathy      Meds Administered:  Meds  ordered this encounter  Medications  . methylPREDNISolone acetate (DEPO-MEDROL) injection 80 mg    Laterality: Bilateral  Location/Site:  L3-L4  Needle:5.0 in., 22 ga.  Short bevel or Quincke spinal needle  Needle Placement: Transforaminal  Findings:    -Comments: Excellent flow of contrast along the nerve, nerve root and into the epidural space.  Procedure Details: After squaring off the end-plates to get a true AP view, the C-arm was positioned so that an oblique view of the foramen as noted above was visualized. The target area is just inferior to the "nose of the scotty dog" or sub pedicular. The soft tissues overlying this structure were infiltrated with 2-3 ml. of 1% Lidocaine without Epinephrine.  The spinal needle was inserted toward the target using a "trajectory" view along the fluoroscope beam.  Under AP and lateral visualization, the needle was advanced so it did not puncture dura and was located close the 6 O'Clock position of the pedical in AP tracterory. Biplanar projections were used to confirm position. Aspiration was confirmed to be negative for CSF and/or blood. A 1-2 ml. volume of Isovue-250 was injected and flow of contrast was noted at each level. Radiographs were obtained for documentation purposes.   After attaining the desired flow of contrast documented above, a 0.5 to 1.0 ml test dose of 0.25% Marcaine was injected into each respective transforaminal space.  The patient was observed for 90 seconds post injection.  After no sensory deficits were reported, and normal lower extremity motor function was noted,   the above injectate was administered  so that equal amounts of the injectate were placed at each foramen (level) into the transforaminal epidural space.   Additional Comments:  The patient tolerated the procedure well Dressing: 2 x 2 sterile gauze and Band-Aid    Post-procedure details: Patient was observed during the procedure. Post-procedure  instructions were reviewed.  Patient left the clinic in stable condition.     Clinical History: MRI LUMBAR SPINE WITHOUT CONTRAST  TECHNIQUE: Multiplanar, multisequence MR imaging of the lumbar spine was performed. No intravenous contrast was administered.  COMPARISON:  2018  FINDINGS: Segmentation:  Standard.  Alignment:  Similar mild retrolisthesis at L2-L3.  Vertebrae: Similar vertebral body heights with multilevel degenerative endplate irregularity. Minor degenerative endplate marrow edema at L3-L4 and L4-L5. No suspicious osseous lesion.  Conus medullaris and cauda equina: Conus extends to the L1-L2 level. Conus and cauda equina appear normal.  Paraspinal and other soft tissues: Unremarkable.  Disc levels:  L1-L2:  No canal or foraminal stenosis.  L2-L3: Disc bulge and facet arthropathy with ligamentum flavum infolding. No canal stenosis. Minor foraminal stenosis.  L3-L4: Disc bulge with prominent foraminal components and facet arthropathy with joint effusion and ligamentum flavum infolding. Increased moderate canal stenosis. Persistent narrowing of the lateral recesses. Similar mild foraminal stenosis.  L4-L5: Disc bulge with endplate osteophytic ridging and left greater than right facet arthropathy with ligamentum flavum infolding. Similar minor canal stenosis. Slight effacement of the lateral recesses. Increased moderate foraminal stenosis.  L5-S1: Disc bulge with endplate osteophytic ridging and facet arthropathy. No canal stenosis. Similar mild right and increased moderate left foraminal stenosis.  IMPRESSION: Multilevel degenerative changes as detailed above with some progression since 2018.   Electronically Signed   By: Macy Mis M.D.   On: 11/11/2019 08:33     Objective:  VS:  HT:    WT:   BMI:     BP:134/83  HR:79bpm  TEMP: ( )  RESP:  Physical Exam Vitals and nursing note reviewed.  Constitutional:       General: She is not in acute distress.    Appearance: Normal appearance. She is not ill-appearing.  HENT:     Head: Normocephalic and atraumatic.     Right Ear: External ear normal.     Left Ear: External ear normal.  Eyes:     Extraocular Movements: Extraocular movements intact.  Cardiovascular:     Rate and Rhythm: Normal rate.     Pulses: Normal pulses.  Pulmonary:     Effort: Pulmonary effort is normal. No respiratory distress.  Abdominal:     General: There is no distension.     Palpations: Abdomen is soft.  Musculoskeletal:        General: Tenderness present.     Cervical back: Neck supple.     Right lower leg: No edema.     Left lower leg: No edema.     Comments: Patient has good distal strength with no pain over the greater trochanters.  No clonus or focal weakness.  Skin:    Findings: No erythema, lesion or rash.  Neurological:     General: No focal deficit present.     Mental Status: She is alert and oriented to person, place, and time.     Sensory: No sensory deficit.     Motor: No weakness or abnormal muscle tone.     Coordination: Coordination normal.  Psychiatric:        Mood and Affect: Mood normal.        Behavior: Behavior  normal.      Imaging: No results found.

## 2020-06-15 ENCOUNTER — Telehealth: Payer: Self-pay

## 2020-06-15 NOTE — Telephone Encounter (Signed)
Is auth needed for Bilateral L3 TF? Scheduled for 3/23 at 1300 with driver.

## 2020-06-15 NOTE — Telephone Encounter (Signed)
Patients husband called patient would like to schedule a appointment with Dr.Newton call back:[phone]

## 2020-06-15 NOTE — Telephone Encounter (Signed)
Bilateral L3 TF 03/02/20. Ok to repeat if helped, same problem/side, and no new injury?

## 2020-06-15 NOTE — Telephone Encounter (Signed)
ok 

## 2020-06-16 DIAGNOSIS — R7303 Prediabetes: Secondary | ICD-10-CM | POA: Insufficient documentation

## 2020-06-16 NOTE — Telephone Encounter (Signed)
Not req Auth# 

## 2020-06-17 ENCOUNTER — Encounter: Payer: Self-pay | Admitting: Cardiology

## 2020-06-17 ENCOUNTER — Other Ambulatory Visit: Payer: Self-pay

## 2020-06-17 ENCOUNTER — Ambulatory Visit (INDEPENDENT_AMBULATORY_CARE_PROVIDER_SITE_OTHER): Payer: Medicare HMO | Admitting: Cardiology

## 2020-06-17 VITALS — BP 146/74 | HR 86 | Ht 62.0 in | Wt 173.8 lb

## 2020-06-17 DIAGNOSIS — Z8679 Personal history of other diseases of the circulatory system: Secondary | ICD-10-CM | POA: Diagnosis not present

## 2020-06-17 DIAGNOSIS — E088 Diabetes mellitus due to underlying condition with unspecified complications: Secondary | ICD-10-CM

## 2020-06-17 DIAGNOSIS — Z9889 Other specified postprocedural states: Secondary | ICD-10-CM

## 2020-06-17 DIAGNOSIS — I10 Essential (primary) hypertension: Secondary | ICD-10-CM | POA: Diagnosis not present

## 2020-06-17 DIAGNOSIS — E782 Mixed hyperlipidemia: Secondary | ICD-10-CM | POA: Diagnosis not present

## 2020-06-17 NOTE — Progress Notes (Signed)
Cardiology Office Note:    Date:  06/17/2020   ID:  Cassandra Brooks, DOB [birth year], MRN [id number]  PCP:  Cassandra Dandy, NP  Cardiologist:  Jenean Lindau, MD   Referring MD: Cassandra Dandy, NP    ASSESSMENT:    1. Essential hypertension   2. Diabetes mellitus due to underlying condition with unspecified complications (Cassandra Brooks)   3. Mixed dyslipidemia   4. Status post AAA (abdominal aortic aneurysm) repair    PLAN:    In order of problems listed above:  1. Primary prevention stressed with the patient.  Importance of compliance with diet medication stressed and she vocalized understanding.  Results of stress test discussed with her at length.  She is ambulatory well appropriate to her age. 2. Essential hypertension: Blood pressure stable and diet was emphasized. 3. Abdominal aortic aneurysm post repair: On statin therapy and doing well.  Last review was unremarkable and this was discussed with her. 4. Mixed dyslipidemia: On statin therapy.  Statins are fine and I reviewed them from the [facility] sheet.  Patient also is a diabetic and discussed diet and exercise and losing weight with her at length. 5. Obesity: Weight reduction stressed.  Risks of obesity explained and she vocalized understanding.  Diet was emphasized.  She plans to do better. 6. Patient will be seen in follow-up appointment in 6 months or earlier if the patient has any concerns    Medication Adjustments/Labs and Tests Ordered: Current medicines are reviewed at length with the patient today.  Concerns regarding medicines are outlined above.  No orders of the defined types were placed in this encounter.  No orders of the defined types were placed in this encounter.    No chief complaint on file.    History of Present Illness:    Cassandra Brooks is a 80 y.o. adult.  Patient has past medical history of atherosclerotic vascular disease, abdominal aortic aneurysm post repair, essential hypertension, diabetes mellitus.  She  denies any problems at this time and takes care of activities of daily living.  She has undergone orthopedic surgery with good results.  She is ambulatory.  She denies any chest pain orthopnea or PND.  At the time of my evaluation, the patient is alert awake oriented and in no distress.  Past Medical History:  Diagnosis Date  . AAA (abdominal aortic aneurysm) ([place])   . Arthritis    "knees" (03/21/2017)  . Borderline diabetes   . Bulging lumbar disc   . Depression   . Diabetes mellitus due to underlying condition with unspecified complications (Cassandra Brooks) 10/14/1023  . Disequilibrium   . Dizziness   . Essential hypertension 12/19/2019  . Fall   . Fibromyalgia   . GERD (gastroesophageal reflux disease)   . Glaucoma, both eyes   . Hemoptysis 03/18/2015  . High cholesterol    "don't have it but I take RX" (03/21/2017)  . Hypertension   . Mixed dyslipidemia 12/19/2019  . Nuclear cataract of both eyes 01/01/2014  . Pre-diabetes   . Preoperative cardiovascular examination 12/19/2019  . Primary osteoarthritis of left knee 07/18/2019  . Solitary pulmonary nodule 03/18/2015   03/2015 41mm RLL nodule   . Status post AAA (abdominal aortic aneurysm) repair 12/19/2019  . Status post total left knee replacement 03/27/2020  . Syncope 03/21/2017  . Syncope and collapse     Past Surgical History:  Procedure Laterality Date  . ABDOMINAL AORTIC ANEURYSM REPAIR  1966  . APPENDECTOMY  1966  . COLONOSCOPY    .  TOTAL KNEE ARTHROPLASTY Left 03/27/2020   Procedure: LEFT TOTAL KNEE ARTHROPLASTY;  Surgeon: Leandrew Koyanagi, MD;  Location: [facility];  Service: Orthopedics;  Laterality: Left;  Cassandra Brooks VAGINAL HYSTERECTOMY     "partial"  . VIDEO BRONCHOSCOPY Bilateral 03/19/2015   Procedure: VIDEO BRONCHOSCOPY WITHOUT FLUORO;  Surgeon: Rigoberto Noel, MD;  Location: [facility];  Service: Cardiopulmonary;  Laterality: Bilateral;    Current Medications: Current Meds  Medication Sig  . acetaminophen (TYLENOL) 500 MG tablet Take 1,000  mg by mouth every 6 (six) hours as needed for mild pain.  Cassandra Brooks amLODipine (NORVASC) 10 MG tablet Take 10 mg by mouth daily.  Cassandra Brooks aspirin EC 81 MG tablet Take 81 mg by mouth daily. Swallow whole.  . baclofen (LIORESAL) 10 MG tablet TAKE 1 TABLET(10 MG) BY MOUTH THREE TIMES DAILY AS NEEDED FOR SPASM  . Calcium-Magnesium-Vitamin D (CALCIUM 1200+D3 PO) Take 1 tablet by mouth daily.  Cassandra Brooks docusate sodium (COLACE) 100 MG capsule Take 100 mg by mouth as needed for mild constipation.  . dorzolamide-timolol (COSOPT) 22.3-6.8 MG/ML ophthalmic solution Place 2 drops into both eyes daily.   . fluticasone (FLONASE) 50 MCG/ACT nasal spray Place 2 sprays into both nostrils 2 (two) times daily.  Cassandra Brooks gabapentin (NEURONTIN) 100 MG capsule Take 1 capsule (100 mg total) by mouth 2 (two) times daily with breakfast and lunch.  . hydrochlorothiazide (HYDRODIURIL) 12.5 MG tablet Take 12.5 mg by mouth daily.   Cassandra Brooks LUMIGAN 0.01 % SOLN Place 1 drop into both eyes at bedtime.  . meclizine (ANTIVERT) 12.5 MG tablet Take 12.5 mg by mouth every 6 (six) hours as needed for dizziness.  . methocarbamol (ROBAXIN) 500 MG tablet Take 500 mg by mouth 2 (two) times daily as needed for muscle spasms.  . Multiple Vitamin (MULTIVITAMIN) tablet Take 1 tablet by mouth daily.  . nabumetone (RELAFEN) 750 MG tablet Take 750 mg by mouth 2 (two) times daily.  Cassandra Brooks omeprazole (PRILOSEC) 20 MG capsule Take 20 mg by mouth daily.  . pravastatin (PRAVACHOL) 20 MG tablet Take 20 mg by mouth daily.  Cassandra Brooks venlafaxine XR (EFFEXOR-XR) 150 MG 24 hr capsule Take 1 capsule (150 mg total) by mouth daily with breakfast.     Allergies:   Patient has no known allergies.   Social History   Socioeconomic History  . Marital status: Married    Spouse name: Not on file  . Number of children: Not on file  . Years of education: Not on file  . Highest education level: Not on file  Occupational History  . Occupation: retired    Comment: Charity fundraiser; dietician  Tobacco Use  .  Smoking status: Never Smoker  . Smokeless tobacco: Never Used  Vaping Use  . Vaping Use: Never used  Substance and Sexual Activity  . Alcohol use: No  . Drug use: No  . Sexual activity: Not on file  Other Topics Concern  . Not on file  Social History Narrative  . Not on file   Social Determinants of Health   Financial Resource Strain: Not on file  Food Insecurity: Not on file  Transportation Needs: Not on file  Physical Activity: Not on file  Stress: Not on file  Social Connections: Not on file     Family History: The patient's family history includes Colon cancer in her brother; Diabetes in her brother, brother, mother, and sister; Glaucoma in her mother; Hypertension in her father; Lung cancer in her brother; Lupus in her child; Stomach cancer in  her father.  ROS:   Please see the history of present illness.    All other systems reviewed and are negative.  EKGs/Labs/Other Studies Reviewed:    The following studies were reviewed today: Summary:  Abdominal Aorta: No evidence of an abdominal aortic aneurysm. The largest  aortic measurement is 2.6 cm. No previous exam available for comparison.  IVC/Iliac: No evidence of thrombus in IVC    *See table(s) above for measurements and observations.    Electronically signed by Shirlee More MD on 01/10/2020 at 12:23:15 PM.    Recent Labs: 03/24/2020: ALT 22; BUN 31; Creatinine, Ser 1.12; Hemoglobin 14.2; Platelets 205; Potassium 3.8; Sodium 141  Recent Lipid Panel No results found for: CHOL, TRIG, HDL, CHOLHDL, VLDL, LDLCALC, LDLDIRECT  Physical Exam:    VS:  BP (!) 146/74   Pulse 86   Ht 5\' 2"  (1.575 m)   Wt 173 lb 12.8 oz (78.8 kg)   SpO2 95%   BMI 31.79 kg/m     Wt Readings from Last 3 Encounters:  06/17/20 173 lb 12.8 oz (78.8 kg)  03/27/20 165 lb (74.8 kg)  03/24/20 165 lb (74.8 kg)     GEN: Patient is in no acute distress HEENT: Normal NECK: No JVD; No carotid bruits LYMPHATICS: No  lymphadenopathy CARDIAC: Hear sounds regular, 2/6 systolic murmur at the apex. RESPIRATORY:  Clear to auscultation without rales, wheezing or rhonchi  ABDOMEN: Soft, non-tender, non-distended MUSCULOSKELETAL:  No edema; No deformity  SKIN: Warm and dry NEUROLOGIC:  Alert and oriented x 3 PSYCHIATRIC:  Normal affect   Signed, Jenean Lindau, MD  06/17/2020 1:36 PM    [facility]

## 2020-06-17 NOTE — Patient Instructions (Signed)

## 2020-06-18 ENCOUNTER — Ambulatory Visit (INDEPENDENT_AMBULATORY_CARE_PROVIDER_SITE_OTHER): Payer: Medicare HMO | Admitting: Orthopaedic Surgery

## 2020-06-18 ENCOUNTER — Encounter: Payer: Self-pay | Admitting: Orthopaedic Surgery

## 2020-06-18 VITALS — Ht 62.0 in | Wt 173.0 lb

## 2020-06-18 DIAGNOSIS — Z96652 Presence of left artificial knee joint: Secondary | ICD-10-CM

## 2020-06-18 MED ORDER — AMOXICILLIN 500 MG PO CAPS: 2000.0000 mg | ORAL_CAPSULE | Freq: Once | ORAL | 6 refills | Status: AC

## 2020-06-18 NOTE — Progress Notes (Signed)
Post-Op Visit Note   Patient: Cassandra Brooks           Date of Birth: March 08, 1941           MRN: [id number] Visit Date: 06/18/2020 PCP: Lowella Dandy, NP   Assessment & Plan:  Chief Complaint:  Chief Complaint  Patient presents with  . Left Knee - Follow-up    Left total knee arthroplasty 03/27/2020   Visit Diagnoses:  1. Status post total left knee replacement     Plan:   Carry is 4-month status post left total knee replacement.  She still has some mild discomfort with walking and start up stiffness.  Overall she is very happy and only takes an occasional Tylenol.  Left knee shows a fully healed surgical scar.  She has excellent range of motion 0 to 120 degrees.  She has no swelling.  Collaterals are stable.  At this point she will continue to increase her activities.  She is very happy with her outcome so far.  We will see her back in 9 months for 1 year visit per patient's request.  Dental prophylaxis reinforced.  Follow-Up Instructions: Return in about 9 months (around 03/20/2021).   Orders:  No orders of the defined types were placed in this encounter.  Meds ordered this encounter  Medications  . amoxicillin (AMOXIL) 500 MG capsule    Sig: Take 4 capsules (2,000 mg total) by mouth once for 1 dose.    Dispense:  4 capsule    Refill:  6    Imaging: No results found.  PMFS History: Patient Active Problem List   Diagnosis Date Noted  . Pre-diabetes   . Status post total left knee replacement 03/27/2020  . Essential hypertension 12/19/2019  . Mixed dyslipidemia 12/19/2019  . Diabetes mellitus due to underlying condition with unspecified complications ([place]) 72/53/6644  . Preoperative cardiovascular examination 12/19/2019  . Status post AAA (abdominal aortic aneurysm) repair 12/19/2019  . Syncope and collapse   . Hypertension   . High cholesterol   . Glaucoma, both eyes   . GERD (gastroesophageal reflux disease)   . Fibromyalgia   . Depression   . Bulging  lumbar disc   . Borderline diabetes   . Arthritis   . AAA (abdominal aortic aneurysm) ([place])   . Primary osteoarthritis of left knee 07/18/2019  . Disequilibrium   . Syncope 03/21/2017  . Dizziness   . Fall   . Hemoptysis 03/18/2015  . Solitary pulmonary nodule 03/18/2015  . Nuclear cataract of both eyes 01/01/2014   Past Medical History:  Diagnosis Date  . AAA (abdominal aortic aneurysm) ([place])   . Arthritis    "knees" (03/21/2017)  . Borderline diabetes   . Bulging lumbar disc   . Depression   . Diabetes mellitus due to underlying condition with unspecified complications ([place]) 0/06/4740  . Disequilibrium   . Dizziness   . Essential hypertension 12/19/2019  . Fall   . Fibromyalgia   . GERD (gastroesophageal reflux disease)   . Glaucoma, both eyes   . Hemoptysis 03/18/2015  . High cholesterol    "don't have it but I take RX" (03/21/2017)  . Hypertension   . Mixed dyslipidemia 12/19/2019  . Nuclear cataract of both eyes 01/01/2014  . Pre-diabetes   . Preoperative cardiovascular examination 12/19/2019  . Primary osteoarthritis of left knee 07/18/2019  . Solitary pulmonary nodule 03/18/2015   03/2015 74mm RLL nodule   . Status post AAA (abdominal aortic aneurysm) repair 12/19/2019  .  Status post total left knee replacement 03/27/2020  . Syncope 03/21/2017  . Syncope and collapse     Family History  Problem Relation Age of Onset  . Hypertension Father   . Stomach cancer Father   . Glaucoma Mother   . Diabetes Mother   . Diabetes Sister   . Diabetes Brother   . Lupus Child   . Diabetes Brother   . Lung cancer Brother   . Colon cancer Brother     Past Surgical History:  Procedure Laterality Date  . ABDOMINAL AORTIC ANEURYSM REPAIR  1966  . APPENDECTOMY  1966  . COLONOSCOPY    . TOTAL KNEE ARTHROPLASTY Left 03/27/2020   Procedure: LEFT TOTAL KNEE ARTHROPLASTY;  Surgeon: Leandrew Koyanagi, MD;  Location: [facility];  Service: Orthopedics;  Laterality: Left;  Marland Kitchen VAGINAL HYSTERECTOMY      "partial"  . VIDEO BRONCHOSCOPY Bilateral 03/19/2015   Procedure: VIDEO BRONCHOSCOPY WITHOUT FLUORO;  Surgeon: Rigoberto Noel, MD;  Location: [facility];  Service: Cardiopulmonary;  Laterality: Bilateral;   Social History   Occupational History  . Occupation: retired    Comment: Charity fundraiser; dietician  Tobacco Use  . Smoking status: Never Smoker  . Smokeless tobacco: Never Used  Vaping Use  . Vaping Use: Never used  Substance and Sexual Activity  . Alcohol use: No  . Drug use: No  . Sexual activity: Not on file

## 2020-06-26 ENCOUNTER — Telehealth: Payer: Self-pay | Admitting: *Deleted

## 2020-06-26 NOTE — Telephone Encounter (Signed)
Ortho bundle 90 day call completed. 

## 2020-07-01 ENCOUNTER — Ambulatory Visit: Payer: Self-pay

## 2020-07-01 ENCOUNTER — Other Ambulatory Visit: Payer: Self-pay

## 2020-07-01 ENCOUNTER — Ambulatory Visit (INDEPENDENT_AMBULATORY_CARE_PROVIDER_SITE_OTHER): Payer: Medicare HMO | Admitting: Physical Medicine and Rehabilitation

## 2020-07-01 ENCOUNTER — Encounter: Payer: Self-pay | Admitting: Physical Medicine and Rehabilitation

## 2020-07-01 VITALS — BP 143/81 | HR 76

## 2020-07-01 DIAGNOSIS — M5416 Radiculopathy, lumbar region: Secondary | ICD-10-CM | POA: Diagnosis not present

## 2020-07-01 DIAGNOSIS — M48062 Spinal stenosis, lumbar region with neurogenic claudication: Secondary | ICD-10-CM | POA: Diagnosis not present

## 2020-07-01 MED ORDER — METHYLPREDNISOLONE ACETATE 80 MG/ML IJ SUSP
80.0000 mg | Freq: Once | INTRAMUSCULAR | Status: AC
  Administered 2020-07-01: 80 mg

## 2020-07-01 NOTE — Progress Notes (Signed)
Cassandra Brooks - 80 y.o. adult MRN [id number]  Date of birth: 07-Nov-1940  Office Visit Note: Visit Date: 07/01/2020 PCP: Lowella Dandy, NP Referred by: Lowella Dandy, NP  Subjective: Chief Complaint  Patient presents with  . Lower Back - Pain   HPI:  Cassandra Brooks is a 80 y.o. adult who comes in today for planned repeat Bilateral L3-L4 Lumbar epidural steroid injection with fluoroscopic guidance.  The patient has failed conservative care including home exercise, medications, time and activity modification.  This injection will be diagnostic and hopefully therapeutic.  Please see requesting physician notes for further details and justification. Patient received more than 50% pain relief from prior injection.   Referring: Dr. Basil Dess    ROS Otherwise per HPI.  Assessment & Plan: Visit Diagnoses:    ICD-10-CM   1. Lumbar radiculopathy  M54.16 XR C-ARM NO REPORT    Epidural Steroid injection    methylPREDNISolone acetate (DEPO-MEDROL) injection 80 mg  2. Spinal stenosis of lumbar region with neurogenic claudication  M48.062 XR C-ARM NO REPORT    Epidural Steroid injection    methylPREDNISolone acetate (DEPO-MEDROL) injection 80 mg    Plan: No additional findings.   Meds & Orders:  Meds ordered this encounter  Medications  . methylPREDNISolone acetate (DEPO-MEDROL) injection 80 mg    Orders Placed This Encounter  Procedures  . XR C-ARM NO REPORT  . Epidural Steroid injection    Follow-up: Return for visit to requesting physician as needed.   Procedures: No procedures performed  Lumbosacral Transforaminal Epidural Steroid Injection - Sub-Pedicular Approach with Fluoroscopic Guidance  Patient: Cassandra Brooks      Date of Birth: June 30, 1940 MRN: [id number] PCP: Lowella Dandy, NP      Visit Date: 07/01/2020   Universal Protocol:    Date/Time: 07/01/2020  Consent Given By: the patient  Position: PRONE  Additional Comments: Vital signs were monitored before and  after the procedure. Patient was prepped and draped in the usual sterile fashion. The correct patient, procedure, and site was verified.   Injection Procedure Details:   Procedure diagnoses: Lumbar radiculopathy [M54.16]    Meds Administered:  Meds ordered this encounter  Medications  . methylPREDNISolone acetate (DEPO-MEDROL) injection 80 mg    Laterality: Bilateral  Location/Site:  L3-L4  Needle:5.0 in., 22 ga.  Short bevel or Quincke spinal needle  Needle Placement: Transforaminal  Findings:    -Comments: Excellent flow of contrast along the nerve, nerve root and into the epidural space.  Procedure Details: After squaring off the end-plates to get a true AP view, the C-arm was positioned so that an oblique view of the foramen as noted above was visualized. The target area is just inferior to the "nose of the scotty dog" or sub pedicular. The soft tissues overlying this structure were infiltrated with 2-3 ml. of 1% Lidocaine without Epinephrine.  The spinal needle was inserted toward the target using a "trajectory" view along the fluoroscope beam.  Under AP and lateral visualization, the needle was advanced so it did not puncture dura and was located close the 6 O'Clock position of the pedical in AP tracterory. Biplanar projections were used to confirm position. Aspiration was confirmed to be negative for CSF and/or blood. A 1-2 ml. volume of Isovue-250 was injected and flow of contrast was noted at each level. Radiographs were obtained for documentation purposes.   After attaining the desired flow of contrast documented above, a 0.5 to 1.0 ml test dose of 0.25% Marcaine  was injected into each respective transforaminal space.  The patient was observed for 90 seconds post injection.  After no sensory deficits were reported, and normal lower extremity motor function was noted,   the above injectate was administered so that equal amounts of the injectate were placed at each foramen  (level) into the transforaminal epidural space.   Additional Comments:  The patient tolerated the procedure well Dressing: 2 x 2 sterile gauze and Band-Aid    Post-procedure details: Patient was observed during the procedure. Post-procedure instructions were reviewed.  Patient left the clinic in stable condition.      Clinical History: MRI LUMBAR SPINE WITHOUT CONTRAST  TECHNIQUE: Multiplanar, multisequence MR imaging of the lumbar spine was performed. No intravenous contrast was administered.  COMPARISON:  2018  FINDINGS: Segmentation:  Standard.  Alignment:  Similar mild retrolisthesis at L2-L3.  Vertebrae: Similar vertebral body heights with multilevel degenerative endplate irregularity. Minor degenerative endplate marrow edema at L3-L4 and L4-L5. No suspicious osseous lesion.  Conus medullaris and cauda equina: Conus extends to the L1-L2 level. Conus and cauda equina appear normal.  Paraspinal and other soft tissues: Unremarkable.  Disc levels:  L1-L2:  No canal or foraminal stenosis.  L2-L3: Disc bulge and facet arthropathy with ligamentum flavum infolding. No canal stenosis. Minor foraminal stenosis.  L3-L4: Disc bulge with prominent foraminal components and facet arthropathy with joint effusion and ligamentum flavum infolding. Increased moderate canal stenosis. Persistent narrowing of the lateral recesses. Similar mild foraminal stenosis.  L4-L5: Disc bulge with endplate osteophytic ridging and left greater than right facet arthropathy with ligamentum flavum infolding. Similar minor canal stenosis. Slight effacement of the lateral recesses. Increased moderate foraminal stenosis.  L5-S1: Disc bulge with endplate osteophytic ridging and facet arthropathy. No canal stenosis. Similar mild right and increased moderate left foraminal stenosis.  IMPRESSION: Multilevel degenerative changes as detailed above with some progression since  2018.   Electronically Signed   By: Macy Mis M.D.   On: 11/11/2019 08:33     Objective:  VS:  HT:    WT:   BMI:     BP:(!) 143/81  HR:76bpm  TEMP: ( )  RESP:  Physical Exam Vitals and nursing note reviewed.  Constitutional:      General: She is not in acute distress.    Appearance: Normal appearance. She is well-developed. She is not ill-appearing.  HENT:     Head: Normocephalic and atraumatic.     Right Ear: External ear normal.     Left Ear: External ear normal.     Nose: No congestion.  Eyes:     Extraocular Movements: Extraocular movements intact.     Conjunctiva/sclera: Conjunctivae normal.     Pupils: Pupils are equal, round, and reactive to light.  Cardiovascular:     Rate and Rhythm: Normal rate.     Pulses: Normal pulses.  Pulmonary:     Effort: Pulmonary effort is normal. No respiratory distress.  Abdominal:     General: There is no distension.     Palpations: Abdomen is soft.  Musculoskeletal:        General: No tenderness or signs of injury.     Cervical back: Neck supple.     Right lower leg: No edema.     Left lower leg: No edema.     Comments: Patient has good distal strength without clonus.  Skin:    General: Skin is warm and dry.     Findings: No erythema or rash.  Neurological:  General: No focal deficit present.     Mental Status: She is alert and oriented to person, place, and time.     Sensory: No sensory deficit.     Motor: No weakness or abnormal muscle tone.     Coordination: Coordination normal.     Gait: Gait normal.  Psychiatric:        Mood and Affect: Mood normal.        Behavior: Behavior normal.      Imaging: No results found.

## 2020-07-01 NOTE — Patient Instructions (Signed)

## 2020-07-01 NOTE — Progress Notes (Signed)
Low back and bilateral buttock pain. Worse with standing. Good relief from last injection.  Numeric Pain Rating Scale and Functional Assessment Average Pain 8   In the last MONTH (on 0-10 scale) has pain interfered with the following?  1. General activity like being  able to carry out your everyday physical activities such as walking, climbing stairs, carrying groceries, or moving a chair?  Rating(9)   +Driver, -BT, -Dye Allergies.

## 2020-07-01 NOTE — Procedures (Signed)
Lumbosacral Transforaminal Epidural Steroid Injection - Sub-Pedicular Approach with Fluoroscopic Guidance  Patient: Cassandra Brooks      Date of Birth: [birth year] MRN: [id number] PCP: Lowella Dandy, NP      Visit Date: 07/01/2020   Universal Protocol:    Date/Time: 07/01/2020  Consent Given By: the patient  Position: PRONE  Additional Comments: Vital signs were monitored before and after the procedure. Patient was prepped and draped in the usual sterile fashion. The correct patient, procedure, and site was verified.   Injection Procedure Details:   Procedure diagnoses: Lumbar radiculopathy [M54.16]    Meds Administered:  Meds ordered this encounter  Medications  . methylPREDNISolone acetate (DEPO-MEDROL) injection 80 mg    Laterality: Bilateral  Location/Site:  L3-L4  Needle:5.0 in., 22 ga.  Short bevel or Quincke spinal needle  Needle Placement: Transforaminal  Findings:    -Comments: Excellent flow of contrast along the nerve, nerve root and into the epidural space.  Procedure Details: After squaring off the end-plates to get a true AP view, the C-arm was positioned so that an oblique view of the foramen as noted above was visualized. The target area is just inferior to the "nose of the scotty dog" or sub pedicular. The soft tissues overlying this structure were infiltrated with 2-3 ml. of 1% Lidocaine without Epinephrine.  The spinal needle was inserted toward the target using a "trajectory" view along the fluoroscope beam.  Under AP and lateral visualization, the needle was advanced so it did not puncture dura and was located close the 6 O'Clock position of the pedical in AP tracterory. Biplanar projections were used to confirm position. Aspiration was confirmed to be negative for CSF and/or blood. A 1-2 ml. volume of Isovue-250 was injected and flow of contrast was noted at each level. Radiographs were obtained for documentation purposes.   After attaining the  desired flow of contrast documented above, a 0.5 to 1.0 ml test dose of 0.25% Marcaine was injected into each respective transforaminal space.  The patient was observed for 90 seconds post injection.  After no sensory deficits were reported, and normal lower extremity motor function was noted,   the above injectate was administered so that equal amounts of the injectate were placed at each foramen (level) into the transforaminal epidural space.   Additional Comments:  The patient tolerated the procedure well Dressing: 2 x 2 sterile gauze and Band-Aid    Post-procedure details: Patient was observed during the procedure. Post-procedure instructions were reviewed.  Patient left the clinic in stable condition.

## 2020-08-12 DIAGNOSIS — H25813 Combined forms of age-related cataract, bilateral: Secondary | ICD-10-CM | POA: Diagnosis not present

## 2020-08-12 DIAGNOSIS — H401133 Primary open-angle glaucoma, bilateral, severe stage: Secondary | ICD-10-CM | POA: Diagnosis not present

## 2020-08-12 DIAGNOSIS — H04123 Dry eye syndrome of bilateral lacrimal glands: Secondary | ICD-10-CM | POA: Diagnosis not present

## 2020-08-12 DIAGNOSIS — H43812 Vitreous degeneration, left eye: Secondary | ICD-10-CM | POA: Diagnosis not present

## 2020-08-21 DIAGNOSIS — M1990 Unspecified osteoarthritis, unspecified site: Secondary | ICD-10-CM | POA: Diagnosis not present

## 2020-08-21 DIAGNOSIS — E785 Hyperlipidemia, unspecified: Secondary | ICD-10-CM | POA: Diagnosis not present

## 2020-08-21 DIAGNOSIS — K219 Gastro-esophageal reflux disease without esophagitis: Secondary | ICD-10-CM | POA: Diagnosis not present

## 2020-08-21 DIAGNOSIS — Z79899 Other long term (current) drug therapy: Secondary | ICD-10-CM | POA: Diagnosis not present

## 2020-08-21 DIAGNOSIS — R7301 Impaired fasting glucose: Secondary | ICD-10-CM | POA: Diagnosis not present

## 2020-08-21 DIAGNOSIS — Z6832 Body mass index (BMI) 32.0-32.9, adult: Secondary | ICD-10-CM | POA: Diagnosis not present

## 2020-08-21 DIAGNOSIS — I10 Essential (primary) hypertension: Secondary | ICD-10-CM | POA: Diagnosis not present

## 2020-08-21 DIAGNOSIS — R69 Illness, unspecified: Secondary | ICD-10-CM | POA: Diagnosis not present

## 2020-08-26 ENCOUNTER — Telehealth: Payer: Self-pay | Admitting: Physical Medicine and Rehabilitation

## 2020-08-26 NOTE — Telephone Encounter (Signed)
ok 

## 2020-08-26 NOTE — Telephone Encounter (Signed)
Patient's husband [name] called advised patient need to schedule an appointment for an injection in her back. The number to contact [name] is [phone]

## 2020-08-26 NOTE — Telephone Encounter (Signed)
Pt last inj was on 07/01/20 Bil L3-L4 TF, pt would like an repeat. Pt has the same pain and no new injury.

## 2020-08-31 ENCOUNTER — Telehealth: Payer: Self-pay

## 2020-08-31 NOTE — Telephone Encounter (Signed)
Return pt call

## 2020-08-31 NOTE — Telephone Encounter (Signed)
Patients husband called regarding the last message he stated the patient is ready to schedule a appointment call back:[phone]

## 2020-09-01 NOTE — Telephone Encounter (Signed)
PENDING

## 2020-09-03 ENCOUNTER — Telehealth: Payer: Self-pay

## 2020-09-03 NOTE — Telephone Encounter (Signed)
Pt was approve #[id number]

## 2020-09-03 NOTE — Telephone Encounter (Signed)
Patients husband called he stated he missed a call from someone in our office did either of you guys call? Call back:[phone]

## 2020-09-03 NOTE — Telephone Encounter (Signed)
Called pt and LVM #1 

## 2020-09-04 NOTE — Telephone Encounter (Signed)
Scheduled for 6/6 at 1330 with driver.

## 2020-09-14 ENCOUNTER — Encounter: Payer: Self-pay | Admitting: Physical Medicine and Rehabilitation

## 2020-09-14 ENCOUNTER — Ambulatory Visit: Payer: Self-pay

## 2020-09-14 ENCOUNTER — Ambulatory Visit (INDEPENDENT_AMBULATORY_CARE_PROVIDER_SITE_OTHER): Payer: Medicare HMO | Admitting: Physical Medicine and Rehabilitation

## 2020-09-14 ENCOUNTER — Other Ambulatory Visit: Payer: Self-pay

## 2020-09-14 VITALS — BP 153/78 | HR 71

## 2020-09-14 DIAGNOSIS — M5416 Radiculopathy, lumbar region: Secondary | ICD-10-CM

## 2020-09-14 MED ORDER — METHYLPREDNISOLONE ACETATE 80 MG/ML IJ SUSP
80.0000 mg | Freq: Once | INTRAMUSCULAR | Status: AC
  Administered 2020-09-14: 80 mg

## 2020-09-14 NOTE — Progress Notes (Signed)
Pt state lower back pain that travels down both legs. Pt state standing and walking makes the pain worse. Pt state she takes over the counter pain meds. Pt has hx of inj on 07/01/20 pt state it helped.  Numeric Pain Rating Scale and Functional Assessment Average Pain 9   In the last MONTH (on 0-10 scale) has pain interfered with the following?  1. General activity like being  able to carry out your everyday physical activities such as walking, climbing stairs, carrying groceries, or moving a chair?  Rating(10)   +Driver, -BT, -Dye Allergies.

## 2020-09-14 NOTE — Patient Instructions (Signed)

## 2020-09-15 NOTE — Progress Notes (Signed)
Cassandra Brooks - 80 y.o. adult MRN [id number]  Date of birth: [birth year]  Office Visit Note: Visit Date: 09/14/2020 PCP: Lowella Dandy, NP Referred by: Lowella Dandy, NP  Subjective: Chief Complaint  Patient presents with  . Lower Back - Pain   HPI:  Cassandra Brooks is a 80 y.o. adult who comes in today for planned repeat Bilateral L3-L4  Lumbar Transforaminal epidural steroid injection with fluoroscopic guidance.  The patient has failed conservative care including home exercise, medications, time and activity modification.  This injection will be diagnostic and hopefully therapeutic.  Please see requesting physician notes for further details and justification. Patient received more than 50% pain relief from prior injection.  Depending on relief I would consider interlaminar epidural approach.  Her case is complicated with diabetes.   Referring: Dr. Eduard Roux    ROS Otherwise per HPI.  Assessment & Plan: Visit Diagnoses:    ICD-10-CM   1. Lumbar radiculopathy  M54.16 XR C-ARM NO REPORT    Epidural Steroid injection    methylPREDNISolone acetate (DEPO-MEDROL) injection 80 mg    Plan: No additional findings.   Meds & Orders:  Meds ordered this encounter  Medications  . methylPREDNISolone acetate (DEPO-MEDROL) injection 80 mg    Orders Placed This Encounter  Procedures  . XR C-ARM NO REPORT  . Epidural Steroid injection    Follow-up: Return in about 2 months (around 11/14/2020).   Procedures: No procedures performed  Lumbosacral Transforaminal Epidural Steroid Injection - Sub-Pedicular Approach with Fluoroscopic Guidance  Patient: Cassandra Brooks      Date of Birth: [birth year] MRN: [id number] PCP: Lowella Dandy, NP      Visit Date: 09/14/2020   Universal Protocol:    Date/Time: 09/14/2020  Consent Given By: the patient  Position: PRONE  Additional Comments: Vital signs were monitored before and after the procedure. Patient was prepped and draped in the usual  sterile fashion. The correct patient, procedure, and site was verified.   Injection Procedure Details:   Procedure diagnoses: Lumbar radiculopathy [M54.16]    Meds Administered:  Meds ordered this encounter  Medications  . methylPREDNISolone acetate (DEPO-MEDROL) injection 80 mg    Laterality: Bilateral  Location/Site:  L3-L4  Needle:5.0 in., 22 ga.  Short bevel or Quincke spinal needle  Needle Placement: Transforaminal  Findings:    -Comments: Excellent flow of contrast along the nerve, nerve root and into the epidural space.  Procedure Details: After squaring off the end-plates to get a true AP view, the C-arm was positioned so that an oblique view of the foramen as noted above was visualized. The target area is just inferior to the "nose of the scotty dog" or sub pedicular. The soft tissues overlying this structure were infiltrated with 2-3 ml. of 1% Lidocaine without Epinephrine.  The spinal needle was inserted toward the target using a "trajectory" view along the fluoroscope beam.  Under AP and lateral visualization, the needle was advanced so it did not puncture dura and was located close the 6 O'Clock position of the pedical in AP tracterory. Biplanar projections were used to confirm position. Aspiration was confirmed to be negative for CSF and/or blood. A 1-2 ml. volume of Isovue-250 was injected and flow of contrast was noted at each level. Radiographs were obtained for documentation purposes.   After attaining the desired flow of contrast documented above, a 0.5 to 1.0 ml test dose of 0.25% Marcaine was injected into each respective transforaminal space.  The patient was observed for  90 seconds post injection.  After no sensory deficits were reported, and normal lower extremity motor function was noted,   the above injectate was administered so that equal amounts of the injectate were placed at each foramen (level) into the transforaminal epidural space.   Additional  Comments:  The patient tolerated the procedure well Dressing: 2 x 2 sterile gauze and Band-Aid    Post-procedure details: Patient was observed during the procedure. Post-procedure instructions were reviewed.  Patient left the clinic in stable condition.      Clinical History: MRI LUMBAR SPINE WITHOUT CONTRAST  TECHNIQUE: Multiplanar, multisequence MR imaging of the lumbar spine was performed. No intravenous contrast was administered.  COMPARISON:  2018  FINDINGS: Segmentation:  Standard.  Alignment:  Similar mild retrolisthesis at L2-L3.  Vertebrae: Similar vertebral body heights with multilevel degenerative endplate irregularity. Minor degenerative endplate marrow edema at L3-L4 and L4-L5. No suspicious osseous lesion.  Conus medullaris and cauda equina: Conus extends to the L1-L2 level. Conus and cauda equina appear normal.  Paraspinal and other soft tissues: Unremarkable.  Disc levels:  L1-L2:  No canal or foraminal stenosis.  L2-L3: Disc bulge and facet arthropathy with ligamentum flavum infolding. No canal stenosis. Minor foraminal stenosis.  L3-L4: Disc bulge with prominent foraminal components and facet arthropathy with joint effusion and ligamentum flavum infolding. Increased moderate canal stenosis. Persistent narrowing of the lateral recesses. Similar mild foraminal stenosis.  L4-L5: Disc bulge with endplate osteophytic ridging and left greater than right facet arthropathy with ligamentum flavum infolding. Similar minor canal stenosis. Slight effacement of the lateral recesses. Increased moderate foraminal stenosis.  L5-S1: Disc bulge with endplate osteophytic ridging and facet arthropathy. No canal stenosis. Similar mild right and increased moderate left foraminal stenosis.  IMPRESSION: Multilevel degenerative changes as detailed above with some progression since 2018.   Electronically Signed   By: Macy Mis M.D.   On:  11/11/2019 08:33     Objective:  VS:  HT:    WT:   BMI:     BP:(!) 153/78  HR:71bpm  TEMP: ( )  RESP:  Physical Exam Vitals and nursing note reviewed.  Constitutional:      General: She is not in acute distress.    Appearance: Normal appearance. She is not ill-appearing.  HENT:     Head: Normocephalic and atraumatic.     Right Ear: External ear normal.     Left Ear: External ear normal.     Nose: No congestion.  Eyes:     Extraocular Movements: Extraocular movements intact.  Cardiovascular:     Rate and Rhythm: Normal rate.     Pulses: Normal pulses.  Pulmonary:     Effort: Pulmonary effort is normal. No respiratory distress.  Abdominal:     General: There is no distension.     Palpations: Abdomen is soft.  Musculoskeletal:        General: No tenderness or signs of injury.     Cervical back: Neck supple.     Right lower leg: No edema.     Left lower leg: No edema.     Comments: Patient has good distal strength without clonus.  Skin:    Findings: No erythema or rash.  Neurological:     General: No focal deficit present.     Mental Status: She is alert and oriented to person, place, and time.     Sensory: No sensory deficit.     Motor: No weakness or abnormal muscle tone.  Coordination: Coordination normal.  Psychiatric:        Mood and Affect: Mood normal.        Behavior: Behavior normal.      Imaging: XR C-ARM NO REPORT  Result Date: 09/14/2020 Please see Notes tab for imaging impression.

## 2020-09-15 NOTE — Procedures (Signed)
Lumbosacral Transforaminal Epidural Steroid Injection - Sub-Pedicular Approach with Fluoroscopic Guidance  Patient: Cassandra Brooks      Date of Birth: [birth year] MRN: [id number] PCP: Lowella Dandy, NP      Visit Date: 09/14/2020   Universal Protocol:    Date/Time: 09/14/2020  Consent Given By: the patient  Position: PRONE  Additional Comments: Vital signs were monitored before and after the procedure. Patient was prepped and draped in the usual sterile fashion. The correct patient, procedure, and site was verified.   Injection Procedure Details:   Procedure diagnoses: Lumbar radiculopathy [M54.16]    Meds Administered:  Meds ordered this encounter  Medications  . methylPREDNISolone acetate (DEPO-MEDROL) injection 80 mg    Laterality: Bilateral  Location/Site:  L3-L4  Needle:5.0 in., 22 ga.  Short bevel or Quincke spinal needle  Needle Placement: Transforaminal  Findings:    -Comments: Excellent flow of contrast along the nerve, nerve root and into the epidural space.  Procedure Details: After squaring off the end-plates to get a true AP view, the C-arm was positioned so that an oblique view of the foramen as noted above was visualized. The target area is just inferior to the "nose of the scotty dog" or sub pedicular. The soft tissues overlying this structure were infiltrated with 2-3 ml. of 1% Lidocaine without Epinephrine.  The spinal needle was inserted toward the target using a "trajectory" view along the fluoroscope beam.  Under AP and lateral visualization, the needle was advanced so it did not puncture dura and was located close the 6 O'Clock position of the pedical in AP tracterory. Biplanar projections were used to confirm position. Aspiration was confirmed to be negative for CSF and/or blood. A 1-2 ml. volume of Isovue-250 was injected and flow of contrast was noted at each level. Radiographs were obtained for documentation purposes.   After attaining the  desired flow of contrast documented above, a 0.5 to 1.0 ml test dose of 0.25% Marcaine was injected into each respective transforaminal space.  The patient was observed for 90 seconds post injection.  After no sensory deficits were reported, and normal lower extremity motor function was noted,   the above injectate was administered so that equal amounts of the injectate were placed at each foramen (level) into the transforaminal epidural space.   Additional Comments:  The patient tolerated the procedure well Dressing: 2 x 2 sterile gauze and Band-Aid    Post-procedure details: Patient was observed during the procedure. Post-procedure instructions were reviewed.  Patient left the clinic in stable condition.

## 2020-10-07 ENCOUNTER — Emergency Department (HOSPITAL_COMMUNITY): Payer: Medicare HMO

## 2020-10-07 ENCOUNTER — Emergency Department (HOSPITAL_COMMUNITY)
Admission: EM | Admit: 2020-10-07 | Discharge: 2020-10-08 | Disposition: A | Discharge status: Home / Self Care | Payer: Medicare HMO | Attending: Emergency Medicine | Admitting: Emergency Medicine

## 2020-10-07 ENCOUNTER — Other Ambulatory Visit: Payer: Self-pay

## 2020-10-07 ENCOUNTER — Encounter (HOSPITAL_COMMUNITY): Payer: Self-pay | Admitting: Emergency Medicine

## 2020-10-07 DIAGNOSIS — E119 Type 2 diabetes mellitus without complications: Secondary | ICD-10-CM | POA: Insufficient documentation

## 2020-10-07 DIAGNOSIS — R42 Dizziness and giddiness: Secondary | ICD-10-CM | POA: Diagnosis not present

## 2020-10-07 DIAGNOSIS — H81399 Other peripheral vertigo, unspecified ear: Secondary | ICD-10-CM

## 2020-10-07 DIAGNOSIS — Z96652 Presence of left artificial knee joint: Secondary | ICD-10-CM | POA: Diagnosis not present

## 2020-10-07 DIAGNOSIS — Z79899 Other long term (current) drug therapy: Secondary | ICD-10-CM | POA: Insufficient documentation

## 2020-10-07 DIAGNOSIS — I10 Essential (primary) hypertension: Secondary | ICD-10-CM | POA: Insufficient documentation

## 2020-10-07 DIAGNOSIS — W19XXXA Unspecified fall, initial encounter: Secondary | ICD-10-CM

## 2020-10-07 DIAGNOSIS — M5416 Radiculopathy, lumbar region: Secondary | ICD-10-CM | POA: Diagnosis not present

## 2020-10-07 DIAGNOSIS — W01198A Fall on same level from slipping, tripping and stumbling with subsequent striking against other object, initial encounter: Secondary | ICD-10-CM | POA: Diagnosis not present

## 2020-10-07 DIAGNOSIS — G319 Degenerative disease of nervous system, unspecified: Secondary | ICD-10-CM | POA: Diagnosis not present

## 2020-10-07 DIAGNOSIS — Z043 Encounter for examination and observation following other accident: Secondary | ICD-10-CM | POA: Diagnosis not present

## 2020-10-07 DIAGNOSIS — Z7982 Long term (current) use of aspirin: Secondary | ICD-10-CM | POA: Diagnosis not present

## 2020-10-07 LAB — URINALYSIS, ROUTINE W REFLEX MICROSCOPIC
Bilirubin Urine: NEGATIVE
Glucose, UA: NEGATIVE mg/dL
Hgb urine dipstick: NEGATIVE
Ketones, ur: NEGATIVE mg/dL
Leukocytes,Ua: NEGATIVE
Nitrite: NEGATIVE
Protein, ur: NEGATIVE mg/dL
Specific Gravity, Urine: 1.017 (ref 1.005–1.030)
pH: 5 (ref 5.0–8.0)

## 2020-10-07 LAB — CBC
HCT: 43.3 % (ref 36.0–46.0)
Hemoglobin: 14.2 g/dL (ref 12.0–15.0)
MCH: 34.5 pg — ABNORMAL HIGH (ref 26.0–34.0)
MCHC: 32.8 g/dL (ref 30.0–36.0)
MCV: 105.1 fL — ABNORMAL HIGH (ref 80.0–100.0)
Platelets: 197 10*3/uL (ref 150–400)
RBC: 4.12 MIL/uL (ref 3.87–5.11)
RDW: 13.2 % (ref 11.5–15.5)
WBC: 4.3 10*3/uL (ref 4.0–10.5)
nRBC: 0 % (ref 0.0–0.2)

## 2020-10-07 LAB — BASIC METABOLIC PANEL
Anion gap: 11 (ref 5–15)
BUN: 28 mg/dL — ABNORMAL HIGH (ref 8–23)
CO2: 26 mmol/L (ref 22–32)
Calcium: 9.2 mg/dL (ref 8.9–10.3)
Chloride: 101 mmol/L (ref 98–111)
Creatinine, Ser: 1.1 mg/dL — ABNORMAL HIGH (ref 0.44–1.00)
GFR, Estimated: 51 mL/min — ABNORMAL LOW (ref >60)
Glucose, Bld: 121 mg/dL — ABNORMAL HIGH (ref 70–99)
Potassium: 3.5 mmol/L (ref 3.5–5.1)
Sodium: 138 mmol/L (ref 135–145)

## 2020-10-07 NOTE — ED Provider Notes (Signed)
Emergency Medicine Provider Triage Evaluation Note  Cassandra Brooks , a 80 y.o. adult  was evaluated in triage.  Pt complains of a fall.  Patient states that she was getting out of the car to help assist her husband and the next thing she knew she was on the ground.  She denies any chest pain, dizziness.  Unsure if she tripped over anything.  She did fall backwards and hit her head.  She does complain of some dizziness now.  No chest pain, no shortness of breath.  No slurred speech, no vision changes  Review of Systems  Positive: As above  Negative: As above  Physical Exam  BP (!) 147/88 (BP Location: Left Arm)   Pulse 76   Temp 97.9 F (36.6 C) (Oral)   Resp 18   Ht 5\' 2"  (1.575 m)   Wt 74.8 kg   SpO2 99%   BMI 30.18 kg/m  Gen:   Awake, no distress   Resp:  Normal effort  MSK:   Moves extremities without difficulty Mental Status:  Alert, thought content appropriate, able to give a coherent history. Speech fluent without evidence of aphasia. Able to follow 2 step commands without difficulty.  Cranial Nerves:  II:  Peripheral visual fields grossly normal, pupils equal, round, reactive to light III,IV, VI: ptosis not present, extra-ocular motions intact bilaterally  V,VII: smile symmetric, facial light touch sensation equal VIII: hearing grossly normal to voice  X: uvula elevates symmetrically  XI: bilateral shoulder shrug symmetric and strong XII: midline tongue extension without fassiculations Motor:  Normal tone. 5/5 strength of BUE and BLE major muscle groups including strong and equal grip strength and dorsiflexion/plantar flexion Sensory: light touch normal in all extremities. Cerebellar: normal finger-to-nose with bilateral upper extremities, Romberg sign absent Gait: not accessed  Other:  No midline tenderness of cervical spine, no visible cranial injuries  Medical Decision Making  Medically screening exam initiated at 4:13 PM.  Appropriate orders placed.  Cassandra Brooks was informed that the remainder of the evaluation will be completed by another provider, this initial triage assessment does not replace that evaluation, and the importance of remaining in the ED until their evaluation is complete.     Lyndel Safe 10/07/20 1638    Luna Fuse, MD 10/16/20 [phone]

## 2020-10-07 NOTE — ED Triage Notes (Signed)
Patient states [facility] and hit her head around 1pm, unsure if she passed out or lost consciousness, states she might have had some dizziness that lead to her fall, states she is dizzy now.

## 2020-10-08 ENCOUNTER — Emergency Department (HOSPITAL_COMMUNITY): Payer: Medicare HMO

## 2020-10-08 DIAGNOSIS — R42 Dizziness and giddiness: Secondary | ICD-10-CM | POA: Diagnosis not present

## 2020-10-08 DIAGNOSIS — M5416 Radiculopathy, lumbar region: Secondary | ICD-10-CM | POA: Diagnosis not present

## 2020-10-08 DIAGNOSIS — G319 Degenerative disease of nervous system, unspecified: Secondary | ICD-10-CM | POA: Diagnosis not present

## 2020-10-08 MED ORDER — ASPIRIN EC 81 MG PO TBEC: 81.0000 mg | DELAYED_RELEASE_TABLET | Freq: Every day | ORAL | Status: DC

## 2020-10-08 MED ORDER — NABUMETONE 500 MG PO TABS: 750.0000 mg | ORAL_TABLET | Freq: Two times a day (BID) | ORAL | Status: DC

## 2020-10-08 MED ORDER — MECLIZINE HCL 25 MG PO TABS
25.0000 mg | ORAL_TABLET | Freq: Once | ORAL | Status: AC
  Administered 2020-10-08: 01:00:00 25 mg via ORAL
  Filled 2020-10-08: qty 1

## 2020-10-08 MED ORDER — MECLIZINE HCL 25 MG PO TABS: 25.0000 mg | ORAL_TABLET | Freq: Three times a day (TID) | ORAL | 0 refills | Status: DC | PRN

## 2020-10-08 MED ORDER — AMLODIPINE BESYLATE 5 MG PO TABS: 10.0000 mg | ORAL_TABLET | Freq: Every day | ORAL | Status: DC

## 2020-10-08 MED ORDER — VENLAFAXINE HCL ER 75 MG PO CP24
150.0000 mg | ORAL_CAPSULE | Freq: Every day | ORAL | Status: DC
  Administered 2020-10-08: 08:00:00 150 mg via ORAL
  Filled 2020-10-08: qty 2

## 2020-10-08 NOTE — ED Notes (Signed)
Assisted to restroom via ambulation

## 2020-10-08 NOTE — ED Notes (Signed)
Patient transported to MRI 

## 2020-10-08 NOTE — ED Notes (Signed)
Pt. ambulated to restroom. Pt stated she did not feel dizzy.

## 2020-10-08 NOTE — ED Notes (Signed)
Pt ambulated to [place] independently, steady gait

## 2020-10-08 NOTE — ED Provider Notes (Signed)
Received signout from previous provider, please see her note for complete H&P.  In short this is an 80 year old female who had a fall yesterday follows with persistent dizziness.  Reports 1 similar episode 3 years ago required hospitalization but without concerning finding.  Her dizziness has improved during this visit but still not fully resolved.  Her work-up today has been fairly unremarkable.  UA without signs of urinary tract infection, labs are mostly reassuring, mild AKI.  EKG without concerning arrhythmia, head and cervical spine CT show no significant injury.  Lastly a brain MRI obtained without evidence of acute stroke.  At this time patient is resting comfortably, reports some improvement of her symptoms, plan to ambulate patient and if she is able to ambulate, anticipate discharge.  Patient voiced understanding and agrees with plan.  8:16 AM Patient was able to ambulate without significant dizziness.  Anticipate vertigo of peripheral origin.  She is stable for discharge.  Will discharge home with meclizine.  Outpatient follow-up recommended.  BP (!) 164/75   Pulse 77   Temp 97.9 F (36.6 C) (Oral)   Resp 18   Ht 5\' 2"  (1.575 m)   Wt 74.8 kg   SpO2 99%   BMI 30.18 kg/m   Results for orders placed or performed during the hospital encounter of 10/07/20  CBC  Result Value Ref Range   WBC 4.3 4.0 - 10.5 K/uL   RBC 4.12 3.87 - 5.11 MIL/uL   Hemoglobin 14.2 12.0 - 15.0 g/dL   HCT 43.3 36.0 - 46.0 %   MCV 105.1 (H) 80.0 - 100.0 fL   MCH 34.5 (H) 26.0 - 34.0 pg   MCHC 32.8 30.0 - 36.0 g/dL   RDW 13.2 11.5 - 15.5 %   Platelets 197 150 - 400 K/uL   nRBC 0.0 0.0 - 0.2 %  Basic metabolic panel  Result Value Ref Range   Sodium 138 135 - 145 mmol/L   Potassium 3.5 3.5 - 5.1 mmol/L   Chloride 101 98 - 111 mmol/L   CO2 26 22 - 32 mmol/L   Glucose, Bld 121 (H) 70 - 99 mg/dL   BUN 28 (H) 8 - 23 mg/dL   Creatinine, Ser 1.10 (H) 0.44 - 1.00 mg/dL   Calcium 9.2 8.9 - 10.3 mg/dL   GFR,  Estimated 51 (L) >60 mL/min   Anion gap 11 5 - 15  Urinalysis, Routine w reflex microscopic Urine, Clean Catch  Result Value Ref Range   Color, Urine YELLOW YELLOW   APPearance CLEAR CLEAR   Specific Gravity, Urine 1.017 1.005 - 1.030   pH 5.0 5.0 - 8.0   Glucose, UA NEGATIVE NEGATIVE mg/dL   Hgb urine dipstick NEGATIVE NEGATIVE   Bilirubin Urine NEGATIVE NEGATIVE   Ketones, ur NEGATIVE NEGATIVE mg/dL   Protein, ur NEGATIVE NEGATIVE mg/dL   Nitrite NEGATIVE NEGATIVE   Leukocytes,Ua NEGATIVE NEGATIVE   Epidural Steroid injection  Result Date: 09/14/2020 Magnus Sinning, MD     09/15/2020  6:28 AM Lumbosacral Transforaminal Epidural Steroid Injection - Sub-Pedicular Approach with Fluoroscopic Guidance Patient: Kathrine Rieves     Date of Birth: 24-Feb-1941 MRN: [id number] PCP: Lowella Dandy, NP     Visit Date: 09/14/2020  Universal Protocol:   Date/Time: 09/14/2020 Consent Given By: the patient Position: PRONE Additional Comments: Vital signs were monitored before and after the procedure. Patient was prepped and draped in the usual sterile fashion. The correct patient, procedure, and site was verified. Injection Procedure Details: Procedure diagnoses: Lumbar  radiculopathy [M54.16]  Meds Administered: Meds ordered this encounter Medications  methylPREDNISolone acetate (DEPO-MEDROL) injection 80 mg Laterality: Bilateral Location/Site: L3-L4 Needle:5.0 in., 22 ga.  Short bevel or Quincke spinal needle Needle Placement: Transforaminal Findings:   -Comments: Excellent flow of contrast along the nerve, nerve root and into the epidural space. Procedure Details: After squaring off the end-plates to get a true AP view, the C-arm was positioned so that an oblique view of the foramen as noted above was visualized. The target area is just inferior to the "nose of the scotty dog" or sub pedicular. The soft tissues overlying this structure were infiltrated with 2-3 ml. of 1% Lidocaine without Epinephrine. The spinal  needle was inserted toward the target using a "trajectory" view along the fluoroscope beam.  Under AP and lateral visualization, the needle was advanced so it did not puncture dura and was located close the 6 O'Clock position of the pedical in AP tracterory. Biplanar projections were used to confirm position. Aspiration was confirmed to be negative for CSF and/or blood. A 1-2 ml. volume of Isovue-250 was injected and flow of contrast was noted at each level. Radiographs were obtained for documentation purposes. After attaining the desired flow of contrast documented above, a 0.5 to 1.0 ml test dose of 0.25% Marcaine was injected into each respective transforaminal space.  The patient was observed for 90 seconds post injection.  After no sensory deficits were reported, and normal lower extremity motor function was noted,   the above injectate was administered so that equal amounts of the injectate were placed at each foramen (level) into the transforaminal epidural space. Additional Comments: The patient tolerated the procedure well Dressing: 2 x 2 sterile gauze and Band-Aid  Post-procedure details: Patient was observed during the procedure. Post-procedure instructions were reviewed. Patient left the clinic in stable condition.   CT Head Wo Contrast  Result Date: 10/07/2020 CLINICAL DATA:  Dizziness fall EXAM: CT HEAD WITHOUT CONTRAST CT CERVICAL SPINE WITHOUT CONTRAST TECHNIQUE: Multidetector CT imaging of the head and cervical spine was performed following the standard protocol without intravenous contrast. Multiplanar CT image reconstructions of the cervical spine were also generated. COMPARISON:  MRI 03/24/2017, CT brain 03/21/2017, 08/24/2019 FINDINGS: CT HEAD FINDINGS Brain: No acute territorial infarction, hemorrhage or intracranial mass. Moderate atrophy. Mild chronic small vessel ischemic change of the white matter. Stable ventricle size. Vascular: No hyperdense vessels.  Carotid vascular calcification.  Skull: Normal. Negative for fracture or focal lesion. Sinuses/Orbits: No acute finding. Other: None CT CERVICAL SPINE FINDINGS Alignment: Straightening of the cervical spine. Trace anterolisthesis C4 on C5. Facet alignment is normal Skull base and vertebrae: No acute fracture. No primary bone lesion or focal pathologic process. Soft tissues and spinal canal: No prevertebral fluid or swelling. No visible canal hematoma. Disc levels: Advanced degenerative change C5-C6 and C6-C7. Mild disc space narrowing at C2-C3. Facet degenerative changes at multiple levels. Upper chest: Negative. Other: None IMPRESSION: 1. No CT evidence for acute intracranial abnormality. Atrophy and chronic small vessel ischemic changes of the white matter. 2. Degenerative changes of the cervical spine. No acute osseous abnormality Electronically Signed   By: Donavan Foil M.D.   On: 10/07/2020 17:45   CT Cervical Spine Wo Contrast  Result Date: 10/07/2020 CLINICAL DATA:  Dizziness fall EXAM: CT HEAD WITHOUT CONTRAST CT CERVICAL SPINE WITHOUT CONTRAST TECHNIQUE: Multidetector CT imaging of the head and cervical spine was performed following the standard protocol without intravenous contrast. Multiplanar CT image reconstructions of the cervical spine were  also generated. COMPARISON:  MRI 03/24/2017, CT brain 03/21/2017, 08/24/2019 FINDINGS: CT HEAD FINDINGS Brain: No acute territorial infarction, hemorrhage or intracranial mass. Moderate atrophy. Mild chronic small vessel ischemic change of the white matter. Stable ventricle size. Vascular: No hyperdense vessels.  Carotid vascular calcification. Skull: Normal. Negative for fracture or focal lesion. Sinuses/Orbits: No acute finding. Other: None CT CERVICAL SPINE FINDINGS Alignment: Straightening of the cervical spine. Trace anterolisthesis C4 on C5. Facet alignment is normal Skull base and vertebrae: No acute fracture. No primary bone lesion or focal pathologic process. Soft tissues and spinal  canal: No prevertebral fluid or swelling. No visible canal hematoma. Disc levels: Advanced degenerative change C5-C6 and C6-C7. Mild disc space narrowing at C2-C3. Facet degenerative changes at multiple levels. Upper chest: Negative. Other: None IMPRESSION: 1. No CT evidence for acute intracranial abnormality. Atrophy and chronic small vessel ischemic changes of the white matter. 2. Degenerative changes of the cervical spine. No acute osseous abnormality Electronically Signed   By: Donavan Foil M.D.   On: 10/07/2020 17:45   MR BRAIN WO CONTRAST  Result Date: 10/08/2020 CLINICAL DATA:  Vertigo. EXAM: MRI HEAD WITHOUT CONTRAST TECHNIQUE: Multiplanar, multiecho pulse sequences of the brain and surrounding structures were obtained without intravenous contrast. COMPARISON:  CT head October 07, 2020.  MRI March 22, 2017. FINDINGS: Brain: No acute infarction, hemorrhage, hydrocephalus, extra-axial collection or mass lesion. Dilated perivascular spaces in bilateral basal ganglia. Scattered moderate T2/FLAIR hyperintensities within the white matter and pons, most likely related to chronic microvascular ischemic disease. Generalized atrophy with ex vacuo ventricular dilation. Vascular: Major arterial flow voids are maintained at the skull base. Skull and upper cervical spine: Normal marrow signal. Sinuses/Orbits: Clear sinuses.  Unremarkable orbits. Other: No sizable mastoid effusions. IMPRESSION: 1. No acute intracranial abnormality. Specifically, no acute infarct. 2. Moderate chronic microvascular ischemic disease and generalized atrophy. Electronically Signed   By: Margaretha Sheffield MD   On: 10/08/2020 06:37   XR C-ARM NO REPORT  Result Date: 09/14/2020 Please see Notes tab for imaging impression.     Domenic Moras, PA-C 10/09/20 [id number]    Isla Pence, MD 10/09/20 [phone]

## 2020-10-08 NOTE — ED Provider Notes (Signed)
[facility] DEPT Provider Note   CSN: [id number] Arrival date & time: 10/07/20  1557     History Chief Complaint  Patient presents with   Fall   Dizziness    Markeita Alicia is a 80 y.o. adult.  80 year old female with a history of AAA s/p repair, diabetes, hypertension, hyperlipidemia presents to the emergency department for evaluation after a fall with associated dizziness.  She was getting out of the car to assist her husband when she unexpectedly fell to the ground.  She denies preceding chest pain, shortness of breath.  Is unsure if she tripped over something.  She did strike the back of her head as a result of her fall.  She had no loss of consciousness.  Since this time she has had difficulty with persistent dizziness; states that she feels "wobbly".  This is aggravated with movement/position change.  She does not ambulate with an assistive device at baseline.  Is very active, per daughter.  She did have an episode of dizziness a number of years ago that necessitated admission and 1 week stay in a skilled nursing facility.  Daughter states they were never able to find what caused her symptoms.  She has not had any vision changes or loss, sinus congestion, extremity numbness or paresthesias, nausea, vomiting, slurred speech, extremity weakness.  Not chronically anticoagulated.  No medications taken PTA for symptoms.  The history is provided by the patient. No language interpreter was used.  Fall  Dizziness     Past Medical History:  Diagnosis Date   AAA (abdominal aortic aneurysm) ([place])    Arthritis    "knees" (03/21/2017)   Borderline diabetes    Bulging lumbar disc    Depression    Diabetes mellitus due to underlying condition with unspecified complications ([place]) 09/16/1273   Disequilibrium    Dizziness    Essential hypertension 12/19/2019   Fall    Fibromyalgia    GERD (gastroesophageal reflux disease)    Glaucoma, both eyes    Hemoptysis  03/18/2015   High cholesterol    "don't have it but I take RX" (03/21/2017)   Hypertension    Mixed dyslipidemia 12/19/2019   Nuclear cataract of both eyes 01/01/2014   Pre-diabetes    Preoperative cardiovascular examination 12/19/2019   Primary osteoarthritis of left knee 07/18/2019   Solitary pulmonary nodule 03/18/2015   03/2015 50mm RLL nodule    Status post AAA (abdominal aortic aneurysm) repair 12/19/2019   Status post total left knee replacement 03/27/2020   Syncope 03/21/2017   Syncope and collapse     Patient Active Problem List   Diagnosis Date Noted   Pre-diabetes    Status post total left knee replacement 03/27/2020   Essential hypertension 12/19/2019   Mixed dyslipidemia 12/19/2019   Diabetes mellitus due to underlying condition with unspecified complications ([place]) 17/00/1749   Preoperative cardiovascular examination 12/19/2019   Status post AAA (abdominal aortic aneurysm) repair 12/19/2019   Syncope and collapse    Hypertension    High cholesterol    Glaucoma, both eyes    GERD (gastroesophageal reflux disease)    Fibromyalgia    Depression    Bulging lumbar disc    Borderline diabetes    Arthritis    AAA (abdominal aortic aneurysm) ([facility])    Primary osteoarthritis of left knee 07/18/2019   Disequilibrium    Syncope 03/21/2017   Dizziness    Fall    Hemoptysis 03/18/2015   Solitary pulmonary nodule 03/18/2015  Nuclear cataract of both eyes 01/01/2014    Past Surgical History:  Procedure Laterality Date   ABDOMINAL AORTIC ANEURYSM REPAIR  1966   APPENDECTOMY  1966   COLONOSCOPY     TOTAL KNEE ARTHROPLASTY Left 03/27/2020   Procedure: LEFT TOTAL KNEE ARTHROPLASTY;  Surgeon: Leandrew Koyanagi, MD;  Location: [facility];  Service: Orthopedics;  Laterality: Left;   VAGINAL HYSTERECTOMY     "partial"   VIDEO BRONCHOSCOPY Bilateral 03/19/2015   Procedure: VIDEO BRONCHOSCOPY WITHOUT FLUORO;  Surgeon: Rigoberto Noel, MD;  Location: [facility];  Service: Cardiopulmonary;   Laterality: Bilateral;     OB History   No obstetric history on file.     Family History  Problem Relation Age of Onset   Hypertension Father    Stomach cancer Father    Glaucoma Mother    Diabetes Mother    Diabetes Sister    Diabetes Brother    Lupus Child    Diabetes Brother    Lung cancer Brother    Colon cancer Brother     Social History   Tobacco Use   Smoking status: Never   Smokeless tobacco: Never  Vaping Use   Vaping Use: Never used  Substance Use Topics   Alcohol use: No   Drug use: No    Home Medications Prior to Admission medications   Medication Sig Start Date End Date Taking? Authorizing Provider  acetaminophen (TYLENOL) 500 MG tablet Take 1,000 mg by mouth every 6 (six) hours as needed for mild pain.   Yes [provider]  amLODipine (NORVASC) 10 MG tablet Take 10 mg by mouth daily.   Yes [provider]  aspirin EC 81 MG tablet Take 81 mg by mouth daily. Swallow whole.   Yes [provider]  docusate sodium (COLACE) 100 MG capsule Take 100 mg by mouth as needed for mild constipation.   Yes [provider]  dorzolamide-timolol (COSOPT) 22.3-6.8 MG/ML ophthalmic solution Place 2 drops into both eyes daily.    Yes [provider]  fluticasone (FLONASE) 50 MCG/ACT nasal spray Place 2 sprays into both nostrils 2 (two) times daily. 11/08/19  Yes [provider]  LUMIGAN 0.01 % SOLN Place 1 drop into both eyes at bedtime. 12/12/19  Yes [provider]  omeprazole (PRILOSEC) 20 MG capsule Take 20 mg by mouth daily.   Yes [provider]  pravastatin (PRAVACHOL) 20 MG tablet Take 20 mg by mouth daily.   Yes [provider]  venlafaxine XR (EFFEXOR-XR) 150 MG 24 hr capsule Take 1 capsule (150 mg total) by mouth daily with breakfast. 03/28/17  Yes Tammi Klippel, Sherin, DO  nabumetone (RELAFEN) 750 MG tablet Take 750 mg by mouth 2 (two) times daily. 02/21/17   [provider]     Allergies    Patient has no known allergies.  Review of Systems   Review of Systems  Neurological:  Positive for dizziness.  Ten systems reviewed and are negative for acute change, except as noted in the HPI.    Physical Exam Updated Vital Signs BP (!) 179/66   Pulse 87   Temp 97.9 F (36.6 C) (Oral)   Resp 20   Ht 5\' 2"  (1.575 m)   Wt 74.8 kg   SpO2 94%   BMI 30.18 kg/m   Physical Exam Vitals and nursing note reviewed.  Constitutional:      General: She is not in acute distress.    Appearance: She is well-developed. She is  not diaphoretic.     Comments: Nontoxic appearing and in NAD. Pleasant.   HENT:     Head: Normocephalic and atraumatic.     Comments: No battle's sign or raccoon's eyes. Eyes:     General: No scleral icterus.    Conjunctiva/sclera: Conjunctivae normal.  Cardiovascular:     Rate and Rhythm: Normal rate and regular rhythm.     Pulses: Normal pulses.  Pulmonary:     Effort: Pulmonary effort is normal. No respiratory distress.     Comments: Respirations even and unlabored Musculoskeletal:        General: Normal range of motion.     Cervical back: Normal range of motion.  Skin:    General: Skin is warm and dry.     Coloration: Skin is not pale.     Findings: No erythema or rash.  Neurological:     Mental Status: She is alert and oriented to person, place, and time.     Cranial Nerves: No cranial nerve deficit.     Coordination: Coordination normal.     Comments: GCS 15. Speech is goal oriented. No cranial nerve deficits appreciated; symmetric eyebrow raise, no facial drooping, tongue midline. Patient has equal grip strength bilaterally with 5/5 strength against resistance in all major muscle groups bilaterally. Sensation to light touch intact. Patient unable to ambulate any significant distance due to profound dizziness.  Psychiatric:        Behavior: Behavior normal.    ED Results / Procedures / Treatments   Labs (all labs ordered are  listed, but only abnormal results are displayed) Labs Reviewed  CBC - Abnormal; Notable for the following components:      Result Value   MCV 105.1 (*)    MCH 34.5 (*)    All other components within normal limits  BASIC METABOLIC PANEL - Abnormal; Notable for the following components:   Glucose, Bld 121 (*)    BUN 28 (*)    Creatinine, Ser 1.10 (*)    GFR, Estimated 51 (*)    All other components within normal limits  URINALYSIS, ROUTINE W REFLEX MICROSCOPIC    EKG EKG Interpretation  Date/Time:  Wednesday October 07 2020 16:19:46 EDT Ventricular Rate:  71 PR Interval:  154 QRS Duration: 66 QT Interval:  402 QTC Calculation: 436 R Axis:   50 Text Interpretation: Normal sinus rhythm Nonspecific T wave abnormality Abnormal ECG Confirmed by Gerlene Fee [phone]) on 10/08/2020 3:11:37 AM  Radiology CT Head Wo Contrast  Result Date: 10/07/2020 CLINICAL DATA:  Dizziness fall EXAM: CT HEAD WITHOUT CONTRAST CT CERVICAL SPINE WITHOUT CONTRAST TECHNIQUE: Multidetector CT imaging of the head and cervical spine was performed following the standard protocol without intravenous contrast. Multiplanar CT image reconstructions of the cervical spine were also generated. COMPARISON:  MRI 03/24/2017, CT brain 03/21/2017, 08/24/2019 FINDINGS: CT HEAD FINDINGS Brain: No acute territorial infarction, hemorrhage or intracranial mass. Moderate atrophy. Mild chronic small vessel ischemic change of the white matter. Stable ventricle size. Vascular: No hyperdense vessels.  Carotid vascular calcification. Skull: Normal. Negative for fracture or focal lesion. Sinuses/Orbits: No acute finding. Other: None CT CERVICAL SPINE FINDINGS Alignment: Straightening of the cervical spine. Trace anterolisthesis C4 on C5. Facet alignment is normal Skull base and vertebrae: No acute fracture. No primary bone lesion or focal pathologic process. Soft tissues and spinal canal: No prevertebral fluid or swelling. No visible canal  hematoma. Disc levels: Advanced degenerative change C5-C6 and C6-C7. Mild disc space narrowing at C2-C3. Facet degenerative  changes at multiple levels. Upper chest: Negative. Other: None IMPRESSION: 1. No CT evidence for acute intracranial abnormality. Atrophy and chronic small vessel ischemic changes of the white matter. 2. Degenerative changes of the cervical spine. No acute osseous abnormality Electronically Signed   By: Donavan Foil M.D.   On: 10/07/2020 17:45   CT Cervical Spine Wo Contrast  Result Date: 10/07/2020 CLINICAL DATA:  Dizziness fall EXAM: CT HEAD WITHOUT CONTRAST CT CERVICAL SPINE WITHOUT CONTRAST TECHNIQUE: Multidetector CT imaging of the head and cervical spine was performed following the standard protocol without intravenous contrast. Multiplanar CT image reconstructions of the cervical spine were also generated. COMPARISON:  MRI 03/24/2017, CT brain 03/21/2017, 08/24/2019 FINDINGS: CT HEAD FINDINGS Brain: No acute territorial infarction, hemorrhage or intracranial mass. Moderate atrophy. Mild chronic small vessel ischemic change of the white matter. Stable ventricle size. Vascular: No hyperdense vessels.  Carotid vascular calcification. Skull: Normal. Negative for fracture or focal lesion. Sinuses/Orbits: No acute finding. Other: None CT CERVICAL SPINE FINDINGS Alignment: Straightening of the cervical spine. Trace anterolisthesis C4 on C5. Facet alignment is normal Skull base and vertebrae: No acute fracture. No primary bone lesion or focal pathologic process. Soft tissues and spinal canal: No prevertebral fluid or swelling. No visible canal hematoma. Disc levels: Advanced degenerative change C5-C6 and C6-C7. Mild disc space narrowing at C2-C3. Facet degenerative changes at multiple levels. Upper chest: Negative. Other: None IMPRESSION: 1. No CT evidence for acute intracranial abnormality. Atrophy and chronic small vessel ischemic changes of the white matter. 2. Degenerative changes of the  cervical spine. No acute osseous abnormality Electronically Signed   By: Donavan Foil M.D.   On: 10/07/2020 17:45    Procedures Procedures   Medications Ordered in ED Medications  amLODipine (NORVASC) tablet 10 mg (has no administration in time range)  aspirin EC tablet 81 mg (has no administration in time range)  venlafaxine XR (EFFEXOR-XR) 24 hr capsule 150 mg (has no administration in time range)  nabumetone (RELAFEN) tablet 750 mg (has no administration in time range)  meclizine (ANTIVERT) tablet 25 mg (25 mg Oral Given 10/08/20 4163)    ED Course  I have reviewed the triage vital signs and the nursing notes.  Pertinent labs & imaging results that were available during my care of the patient were reviewed by me and considered in my medical decision making (see chart for details).  Clinical Course as of 10/08/20 8453  Thu Oct 08, 2020  0253 Per RN patient with some improved ambulation after Meclizine, but still unable to ambulate without a spotter. Plan to hold in ED until AM for MRI to r/o central cause of vertigo. RN to update family on plan. [KH]  [id number] Daily medications ordered pending MRI completion and results. [KH]    Clinical Course User Index [KH] Beverely Pace   MDM Rules/Calculators/A&P                          80 year old, highly functional, female presents after a fall today.  Has since been experiencing profound dizziness characterized as feeling "wobbly".  Is unable to ambulate independently and requires significant amount of assistance.  No other focal deficits on exam.  She is pending MRI to assess for central cause of her vertigo.  No evidence of acute traumatic pathology on CT head or cervical spine today.  She has no complaints of pain.  Signed out to Zena, PA-C at change of shift pending imaging results.  May necessitate admission for symptom control despite MRI findings if dizziness not improved.   Final Clinical Impression(s) / ED Diagnoses Final  diagnoses:  Dizziness    Rx / DC Orders ED Discharge Orders     None        Antonietta Breach, PA-C 10/08/20 [id number]    Maudie Flakes, MD 10/08/20 [phone]

## 2020-10-08 NOTE — ED Notes (Signed)
Pt called daughter to come pick her up since she's been discharged from ED

## 2020-10-08 NOTE — ED Notes (Signed)
Provider made aware that pt ambulated to restroom without feeling dizzy

## 2020-10-08 NOTE — Discharge Instructions (Addendum)
You have been evaluated for your fall and dizziness.  Fortunately your labs are reassuring, an MRI of your brain obtained show no signs of acute stroke.  Please take meclizine as needed for dizziness.  Follow-up closely with your primary care doctor for further care.  Stay hydrated.  Return to ER if you have any concern.

## 2020-10-08 NOTE — ED Notes (Signed)
Return from MRI

## 2020-10-13 DIAGNOSIS — Z1331 Encounter for screening for depression: Secondary | ICD-10-CM | POA: Diagnosis not present

## 2020-10-13 DIAGNOSIS — Z9181 History of falling: Secondary | ICD-10-CM | POA: Diagnosis not present

## 2020-10-13 DIAGNOSIS — Z0000 Encounter for general adult medical examination without abnormal findings: Secondary | ICD-10-CM | POA: Diagnosis not present

## 2020-10-13 DIAGNOSIS — E669 Obesity, unspecified: Secondary | ICD-10-CM | POA: Diagnosis not present

## 2020-10-13 DIAGNOSIS — E785 Hyperlipidemia, unspecified: Secondary | ICD-10-CM | POA: Diagnosis not present

## 2020-10-20 DIAGNOSIS — M778 Other enthesopathies, not elsewhere classified: Secondary | ICD-10-CM | POA: Diagnosis not present

## 2020-10-20 DIAGNOSIS — M722 Plantar fascial fibromatosis: Secondary | ICD-10-CM | POA: Diagnosis not present

## 2020-10-20 DIAGNOSIS — M19072 Primary osteoarthritis, left ankle and foot: Secondary | ICD-10-CM | POA: Diagnosis not present

## 2020-10-21 DIAGNOSIS — M19072 Primary osteoarthritis, left ankle and foot: Secondary | ICD-10-CM | POA: Insufficient documentation

## 2020-10-21 DIAGNOSIS — M722 Plantar fascial fibromatosis: Secondary | ICD-10-CM

## 2020-10-21 HISTORY — DX: Primary osteoarthritis, left ankle and foot: M19.072

## 2020-10-21 HISTORY — DX: Plantar fascial fibromatosis: M72.2

## 2020-10-27 ENCOUNTER — Encounter: Payer: Self-pay | Admitting: Orthopaedic Surgery

## 2020-10-27 ENCOUNTER — Ambulatory Visit (INDEPENDENT_AMBULATORY_CARE_PROVIDER_SITE_OTHER): Payer: Medicare HMO | Admitting: Orthopaedic Surgery

## 2020-10-27 ENCOUNTER — Telehealth: Payer: Self-pay

## 2020-10-27 VITALS — Ht 61.0 in | Wt 165.0 lb

## 2020-10-27 DIAGNOSIS — M48062 Spinal stenosis, lumbar region with neurogenic claudication: Secondary | ICD-10-CM

## 2020-10-27 MED ORDER — PREDNISONE 10 MG (21) PO TBPK
ORAL_TABLET | ORAL | 0 refills | Status: DC
Start: 2020-10-27 — End: 2021-04-27

## 2020-10-27 MED ORDER — TRAMADOL HCL 50 MG PO TABS: 50.0000 mg | ORAL_TABLET | Freq: Every day | ORAL | 0 refills | Status: DC | PRN

## 2020-10-27 NOTE — Telephone Encounter (Signed)
Patient was here to see Dr. Erlinda Hong for her back pain. She has a scheduled appt to see Dr. Ernestina Patches 11/17/2020. She would like to know if she can be seen sooner. Dr. Erlinda Hong asked me to send you msg. Thanks.  Please call patient.  [phone]

## 2020-10-27 NOTE — Telephone Encounter (Signed)
Or call  [phone]

## 2020-10-27 NOTE — Telephone Encounter (Signed)
Appointment rescheduled for 7/20.

## 2020-10-27 NOTE — Progress Notes (Signed)
Patient: Cassandra Brooks           Date of Birth: [birth year]           MRN: [id number] Visit Date: 10/27/2020 PCP: Lowella Dandy, NP   Assessment & Plan:  Chief Complaint:  Chief Complaint  Patient presents with   Lower Back - Pain   Visit Diagnoses:  1. Spinal stenosis of lumbar region with neurogenic claudication     Plan: Jona returns today for continued low back pain.  She is received ESI's with Dr. Ernestina Patches that she states has not helped.  She has an upcoming appointment with Dr. Ernestina Patches to discuss other options.  She had MRI last August which showed multilevel lumbar spondylosis and disc disease.  Denies any bowel or bladder incontinence.  She has pain in the low back that radiates into the buttock and into the legs.  Lower extremity exams are unremarkable and nonfocal.  She has tenderness to the lumbar spine.  I will send in some tramadol and prednisone to hopefully ease her pain while she waits to get into see Dr. Zannie Kehr to discuss further options regarding her back.  Follow-Up Instructions: No follow-ups on file.   Orders:  No orders of the defined types were placed in this encounter.  Meds ordered this encounter  Medications   predniSONE (STERAPRED UNI-PAK 21 TAB) 10 MG (21) TBPK tablet    Sig: Take as directed    Dispense:  21 tablet    Refill:  0   traMADol (ULTRAM) 50 MG tablet    Sig: Take 1-2 tablets (50-100 mg total) by mouth daily as needed.    Dispense:  20 tablet    Refill:  0    Imaging: No results found.  PMFS History: Patient Active Problem List   Diagnosis Date Noted   Pre-diabetes    Status post total left knee replacement 03/27/2020   Essential hypertension 12/19/2019   Mixed dyslipidemia 12/19/2019   Diabetes mellitus due to underlying condition with unspecified complications ([place]) 62/94/7654   Preoperative cardiovascular examination 12/19/2019   Status post AAA (abdominal aortic aneurysm) repair 12/19/2019   Syncope and collapse     Hypertension    High cholesterol    Glaucoma, both eyes    GERD (gastroesophageal reflux disease)    Fibromyalgia    Depression    Bulging lumbar disc    Borderline diabetes    Arthritis    AAA (abdominal aortic aneurysm) ([facility])    Primary osteoarthritis of left knee 07/18/2019   Disequilibrium    Syncope 03/21/2017   Dizziness    Fall    Hemoptysis 03/18/2015   Solitary pulmonary nodule 03/18/2015   Nuclear cataract of both eyes 01/01/2014   Past Medical History:  Diagnosis Date   AAA (abdominal aortic aneurysm) ([place])    Arthritis    "knees" (03/21/2017)   Borderline diabetes    Bulging lumbar disc    Depression    Diabetes mellitus due to underlying condition with unspecified complications ([place]) 09/13/352   Disequilibrium    Dizziness    Essential hypertension 12/19/2019   Fall    Fibromyalgia    GERD (gastroesophageal reflux disease)    Glaucoma, both eyes    Hemoptysis 03/18/2015   High cholesterol    "don't have it but I take RX" (03/21/2017)   Hypertension    Mixed dyslipidemia 12/19/2019   Nuclear cataract of both eyes 01/01/2014   Pre-diabetes    Preoperative cardiovascular examination 12/19/2019  Primary osteoarthritis of left knee 07/18/2019   Solitary pulmonary nodule 03/18/2015   03/2015 64mm RLL nodule    Status post AAA (abdominal aortic aneurysm) repair 12/19/2019   Status post total left knee replacement 03/27/2020   Syncope 03/21/2017   Syncope and collapse     Family History  Problem Relation Age of Onset   Hypertension Father    Stomach cancer Father    Glaucoma Mother    Diabetes Mother    Diabetes Sister    Diabetes Brother    Lupus Child    Diabetes Brother    Lung cancer Brother    Colon cancer Brother     Past Surgical History:  Procedure Laterality Date   ABDOMINAL AORTIC ANEURYSM REPAIR  1966   APPENDECTOMY  1966   COLONOSCOPY     TOTAL KNEE ARTHROPLASTY Left 03/27/2020   Procedure: LEFT TOTAL KNEE ARTHROPLASTY;  Surgeon: Leandrew Koyanagi,  MD;  Location: [facility];  Service: Orthopedics;  Laterality: Left;   VAGINAL HYSTERECTOMY     "partial"   VIDEO BRONCHOSCOPY Bilateral 03/19/2015   Procedure: VIDEO BRONCHOSCOPY WITHOUT FLUORO;  Surgeon: Rigoberto Noel, MD;  Location: [facility];  Service: Cardiopulmonary;  Laterality: Bilateral;   Social History   Occupational History   Occupation: retired    Comment: Charity fundraiser; dietician  Tobacco Use   Smoking status: Never   Smokeless tobacco: Never  Vaping Use   Vaping Use: Never used  Substance and Sexual Activity   Alcohol use: No   Drug use: No   Sexual activity: Not on file

## 2020-10-28 ENCOUNTER — Other Ambulatory Visit: Payer: Self-pay

## 2020-10-28 ENCOUNTER — Ambulatory Visit: Payer: Medicare HMO | Admitting: Physical Medicine and Rehabilitation

## 2020-10-28 ENCOUNTER — Encounter: Payer: Self-pay | Admitting: Physical Medicine and Rehabilitation

## 2020-10-28 VITALS — BP 148/74 | HR 71

## 2020-10-28 DIAGNOSIS — M48062 Spinal stenosis, lumbar region with neurogenic claudication: Secondary | ICD-10-CM

## 2020-10-28 DIAGNOSIS — M797 Fibromyalgia: Secondary | ICD-10-CM

## 2020-10-28 DIAGNOSIS — M47816 Spondylosis without myelopathy or radiculopathy, lumbar region: Secondary | ICD-10-CM

## 2020-10-28 DIAGNOSIS — G894 Chronic pain syndrome: Secondary | ICD-10-CM

## 2020-10-28 DIAGNOSIS — M5416 Radiculopathy, lumbar region: Secondary | ICD-10-CM

## 2020-10-28 NOTE — Progress Notes (Signed)
Bilateral L3 TF on 09/14/20. 20-30% relief for about 2 weeks. Low back pain with pain radiating "all the way down" the back of both legs.  Numeric Pain Rating Scale and Functional Assessment Average Pain 9   In the last MONTH (on 0-10 scale) has pain interfered with the following?  1. General activity like being  able to carry out your everyday physical activities such as walking, climbing stairs, carrying groceries, or moving a chair?  Rating(10)

## 2020-10-29 ENCOUNTER — Encounter: Payer: Self-pay | Admitting: Physical Medicine and Rehabilitation

## 2020-10-29 NOTE — Progress Notes (Signed)
Cassandra Brooks - 80 y.o. adult MRN [id number]  Date of birth: [birth year]  Office Visit Note: Visit Date: 10/28/2020 PCP: Lowella Dandy, NP Referred by: Lowella Dandy, NP  Subjective: Chief Complaint  Patient presents with   Lower Back - Pain   HPI: Cassandra Brooks is a 80 y.o. adult who comes in today For evaluation and management of chronic severe and recalcitrant low back pain with bilateral leg pain.  She rates her pain as a 9 out of 10 and she is using a cane for ambulation.  She is typically very active individual who have seen her over the last many years for intermittent epidural injection.  She is also been followed by Dr. Basil Dess from a spine surgery standpoint.  She has not had any lumbar spine surgery.  The last time I saw her was in June and we completed bilateral L3 transforaminal epidural steroid injection with really only about 20 to 30% relief for a couple of weeks.  That numbers typically even lower than placebo effects.  She is somewhat of a poor historian and really just having a lot of pain issues right now.  Her case is complicated by multiple arthritic complaints and diabetes.  She has history of diagnosed fibromyalgia and  she does have characteristics of underlying pain sensitivity syndrome.  She reports this summer just not being able to do much of nothing because of the pain.  She also reports a situation where if she stands for any length of time trying to cook or clean she will start to feel hot and nauseated and have to sit down.  She reports her pain with a very emotional quality at times as well.  She has had a recent fall backwards without any complications or increase in symptoms etc.  The fall was at the end of June and she was evaluated in the emergency department.  She did have CT scan of the brain and cervical spine which were basically unrevealing but showing degenerative changes throughout.  She does not note focal weakness or bowel or bladder changes or  specific night pain etc.  No cancer history.  We updated MRI of the lumbar spine last year do to an injection that was not really beneficial at the time.  She does have moderate stenosis at L3-4 and has pretty significant lateral recess narrowing.  There is facet arthropathy throughout.  No high-grade nerve compression.  From a management standpoint she has done well with intermittent transforaminal epidural steroid injection up until just recently.  All those notes can be reviewed.  Interventional spine treatment has been performed as part of a comprehensive pain management approach with access to physical therapy which she has completed he continues to stay active at home with gardening and exercise.  She also has had access to Dr. Basil Dess from a spine surgery standpoint.  There is also the availability of pain and psychological consultation through [facility].  Review of Systems  Musculoskeletal:  Positive for back pain and joint pain.       Bilateral leg pain.  Otherwise per HPI.  Assessment & Plan: Visit Diagnoses:    ICD-10-CM   1. Spinal stenosis of lumbar region with neurogenic claudication  M48.062     2. Lumbar radiculopathy  M54.16     3. Spondylosis without myelopathy or radiculopathy, lumbar region  M47.816     4. Fibromyalgia  M79.7     5. Chronic pain syndrome  G89.4  Plan: Findings:  Chronic severe recalcitrant low back pain with referral into the legs per tickly with standing and walking.  Symptoms are very consistent with a combination of facet mediated low back pain and pain Sharol Given to stenosis with neurogenic type claudication symptoms.  She does not really have high-grade stenosis on the last MRI and we have been doing the injections at that level.  She was doing well up until recently with those injections.  Last 1 was reviewed with fluoroscopic guidance and it does show well-placed injection.  I think the neck step is to get preauthorization for diagnostic L4  transforaminal injection bilaterally.  If that helps diagnostically and does last week and obviously look at that as a treatment.  She can regroup with Dr. Basil Dess for surgical consideration but is not really something she wants to do at her 80 years old.  One thought would be diagnostic facet joint blocks/medial branch blocks if we were able to get help for the leg pain and she still had axial pain.  Lastly, I did go over with her spinal cord stimulator trial which she does meet criteria for that with chronic recalcitrant pain that just nothing is helping right now and this is a lady who is normally fairly active.  I do think there may be some underlying central sensitivity pain syndrome or fibromyalgia.  She almost gets a vasovagal type response with standing for a prolonged time Sharol Given the pain.  She does have an ongoing work-up for syncope and dizziness but it just seems to me that she is having such severe pain with standing that it is seemingly a vasovagal response.  I have encouraged her that her spine is pretty sound in that activity and exercise other than being painful is not going to injure her any more than it would the rest of [place].  I have encouraged her to stay active and try to get good sleep at night.  She reports good sleeping conditions.   Meds & Orders: No orders of the defined types were placed in this encounter.  No orders of the defined types were placed in this encounter.   Follow-up: Return for Bilateral L4 transforaminal epidural steroid injection.   Procedures: No procedures performed      Clinical History: MRI LUMBAR SPINE WITHOUT CONTRAST   TECHNIQUE: Multiplanar, multisequence MR imaging of the lumbar spine was performed. No intravenous contrast was administered.   COMPARISON:  2018   FINDINGS: Segmentation:  Standard.   Alignment:  Similar mild retrolisthesis at L2-L3.   Vertebrae: Similar vertebral body heights with multilevel degenerative endplate irregularity. Minor  degenerative endplate marrow edema at L3-L4 and L4-L5. No suspicious osseous lesion.   Conus medullaris and cauda equina: Conus extends to the L1-L2 level. Conus and cauda equina appear normal.   Paraspinal and other soft tissues: Unremarkable.   Disc levels:   L1-L2:  No canal or foraminal stenosis.   L2-L3: Disc bulge and facet arthropathy with ligamentum flavum infolding. No canal stenosis. Minor foraminal stenosis.   L3-L4: Disc bulge with prominent foraminal components and facet arthropathy with joint effusion and ligamentum flavum infolding. Increased moderate canal stenosis. Persistent narrowing of the lateral recesses. Similar mild foraminal stenosis.   L4-L5: Disc bulge with endplate osteophytic ridging and left greater than right facet arthropathy with ligamentum flavum infolding. Similar minor canal stenosis. Slight effacement of the lateral recesses. Increased moderate foraminal stenosis.   L5-S1: Disc bulge with endplate osteophytic ridging and facet arthropathy. No canal stenosis. Similar  mild right and increased moderate left foraminal stenosis.   IMPRESSION: Multilevel degenerative changes as detailed above with some progression since 2018.     Electronically Signed   By: Macy Mis M.D.   On: 11/11/2019 08:33   She reports that she has never smoked. She has never used smokeless tobacco.  Recent Labs    03/24/20 0904  HGBA1C 5.5    Objective:  VS:  HT:    WT:   BMI:     BP:(!) 148/74  HR:71bpm  TEMP: ( )  RESP:  Physical Exam Vitals and nursing note reviewed.  Constitutional:      General: She is not in acute distress.    Appearance: Normal appearance. She is obese. She is not ill-appearing.  HENT:     Head: Normocephalic and atraumatic.     Right Ear: External ear normal.     Left Ear: External ear normal.     Nose: No congestion.  Eyes:     Extraocular Movements: Extraocular movements intact.  Cardiovascular:     Rate and Rhythm:  Normal rate.     Pulses: Normal pulses.  Pulmonary:     Effort: Pulmonary effort is normal. No respiratory distress.  Abdominal:     General: There is no distension.     Palpations: Abdomen is soft.  Musculoskeletal:        General: No tenderness or signs of injury.     Cervical back: Neck supple.     Right lower leg: No edema.     Left lower leg: No edema.     Comments: Patient has good distal strength without clonus.  She ambulates with a cane.  She is slow to rise from a seated position to full extension.  She has concordant low back pain with facet loading and extension.  She has some tender points and trigger points but nothing focal.  She is painful to palpation lightly over the greater trochanters and PSIS.  She has no pain with hip rotation.  Skin:    Findings: No erythema or rash.  Neurological:     General: No focal deficit present.     Mental Status: She is alert and oriented to person, place, and time.     Cranial Nerves: No cranial nerve deficit.     Sensory: No sensory deficit.     Motor: No weakness or abnormal muscle tone.     Coordination: Coordination normal.     Gait: Gait abnormal.  Psychiatric:        Mood and Affect: Mood normal.        Behavior: Behavior normal.    Ortho Exam  Imaging: No results found.  Past Medical/Family/Surgical/Social History: Medications & Allergies reviewed per EMR, new medications updated. Patient Active Problem List   Diagnosis Date Noted   Pre-diabetes    Status post total left knee replacement 03/27/2020   Essential hypertension 12/19/2019   Mixed dyslipidemia 12/19/2019   Diabetes mellitus due to underlying condition with unspecified complications ([place]) 54/62/7035   Preoperative cardiovascular examination 12/19/2019   Status post AAA (abdominal aortic aneurysm) repair 12/19/2019   Syncope and collapse    Hypertension    High cholesterol    Glaucoma, both eyes    GERD (gastroesophageal reflux disease)    Fibromyalgia     Depression    Bulging lumbar disc    Borderline diabetes    Arthritis    AAA (abdominal aortic aneurysm) ([facility])    Primary osteoarthritis of left  knee 07/18/2019   Disequilibrium    Syncope 03/21/2017   Dizziness    Fall    Hemoptysis 03/18/2015   Solitary pulmonary nodule 03/18/2015   Nuclear cataract of both eyes 01/01/2014   Past Medical History:  Diagnosis Date   AAA (abdominal aortic aneurysm) ([place])    Arthritis    "knees" (03/21/2017)   Borderline diabetes    Bulging lumbar disc    Depression    Diabetes mellitus due to underlying condition with unspecified complications ([place]) 08/12/6642   Disequilibrium    Dizziness    Essential hypertension 12/19/2019   Fall    Fibromyalgia    GERD (gastroesophageal reflux disease)    Glaucoma, both eyes    Hemoptysis 03/18/2015   High cholesterol    "don't have it but I take RX" (03/21/2017)   Hypertension    Mixed dyslipidemia 12/19/2019   Nuclear cataract of both eyes 01/01/2014   Pre-diabetes    Preoperative cardiovascular examination 12/19/2019   Primary osteoarthritis of left knee 07/18/2019   Solitary pulmonary nodule 03/18/2015   03/2015 80mm RLL nodule    Status post AAA (abdominal aortic aneurysm) repair 12/19/2019   Status post total left knee replacement 03/27/2020   Syncope 03/21/2017   Syncope and collapse    Family History  Problem Relation Age of Onset   Hypertension Father    Stomach cancer Father    Glaucoma Mother    Diabetes Mother    Diabetes Sister    Diabetes Brother    Lupus Child    Diabetes Brother    Lung cancer Brother    Colon cancer Brother    Past Surgical History:  Procedure Laterality Date   ABDOMINAL AORTIC ANEURYSM REPAIR  1966   APPENDECTOMY  1966   COLONOSCOPY     TOTAL KNEE ARTHROPLASTY Left 03/27/2020   Procedure: LEFT TOTAL KNEE ARTHROPLASTY;  Surgeon: Leandrew Koyanagi, MD;  Location: [facility];  Service: Orthopedics;  Laterality: Left;   VAGINAL HYSTERECTOMY     "partial"   VIDEO  BRONCHOSCOPY Bilateral 03/19/2015   Procedure: VIDEO BRONCHOSCOPY WITHOUT FLUORO;  Surgeon: Rigoberto Noel, MD;  Location: [facility];  Service: Cardiopulmonary;  Laterality: Bilateral;   Social History   Occupational History   Occupation: retired    Comment: Charity fundraiser; dietician  Tobacco Use   Smoking status: Never   Smokeless tobacco: Never  Vaping Use   Vaping Use: Never used  Substance and Sexual Activity   Alcohol use: No   Drug use: No   Sexual activity: Not on file

## 2020-11-05 ENCOUNTER — Other Ambulatory Visit: Payer: Self-pay

## 2020-11-05 ENCOUNTER — Ambulatory Visit: Payer: Medicare HMO | Admitting: Physical Medicine and Rehabilitation

## 2020-11-05 ENCOUNTER — Encounter: Payer: Self-pay | Admitting: Physical Medicine and Rehabilitation

## 2020-11-05 ENCOUNTER — Ambulatory Visit: Payer: Self-pay

## 2020-11-05 VITALS — BP 127/80 | HR 75

## 2020-11-05 DIAGNOSIS — M48062 Spinal stenosis, lumbar region with neurogenic claudication: Secondary | ICD-10-CM | POA: Diagnosis not present

## 2020-11-05 DIAGNOSIS — M5416 Radiculopathy, lumbar region: Secondary | ICD-10-CM

## 2020-11-05 MED ORDER — METHYLPREDNISOLONE ACETATE 80 MG/ML IJ SUSP
80.0000 mg | Freq: Once | INTRAMUSCULAR | Status: AC
  Administered 2020-11-05: 80 mg

## 2020-11-05 NOTE — Patient Instructions (Signed)

## 2020-11-05 NOTE — Progress Notes (Signed)
Pt state lower back pain that travels to buttock. Pt state walking and standing makes pain the worse. Pt satte she takes pain meds and uses heating and ice to help ease her pain. Pt has hx of inj on 2041/01/02 pt state it helped some. Pt state she fell last month.  Numeric Pain Rating Scale and Functional Assessment Average Pain 8   In the last MONTH (on 0-10 scale) has pain interfered with the following?  1. General activity like being  able to carry out your everyday physical activities such as walking, climbing stairs, carrying groceries, or moving a chair?  Rating(10)   +Driver, -BT, -Dye Allergies.

## 2020-11-10 NOTE — Procedures (Signed)
Lumbosacral Transforaminal Epidural Steroid Injection - Sub-Pedicular Approach with Fluoroscopic Guidance  Patient: Cassandra Brooks      Date of Birth: June 27, 1940 MRN: [id number] PCP: Lowella Dandy, NP      Visit Date: 11/05/2020   Universal Protocol:    Date/Time: 11/05/2020  Consent Given By: the patient  Position: PRONE  Additional Comments: Vital signs were monitored before and after the procedure. Patient was prepped and draped in the usual sterile fashion. The correct patient, procedure, and site was verified.   Injection Procedure Details:   Procedure diagnoses: Spinal stenosis of lumbar region with neurogenic claudication [M48.062]    Meds Administered:  Meds ordered this encounter  Medications   methylPREDNISolone acetate (DEPO-MEDROL) injection 80 mg    Laterality: Bilateral  Location/Site:  L4-L5  Needle:5.0 in., 22 ga.  Short bevel or Quincke spinal needle  Needle Placement: Transforaminal  Findings:    -Comments: Excellent flow of contrast along the nerve, nerve root and into the epidural space.  Procedure Details: After squaring off the end-plates to get a true AP view, the C-arm was positioned so that an oblique view of the foramen as noted above was visualized. The target area is just inferior to the "nose of the scotty dog" or sub pedicular. The soft tissues overlying this structure were infiltrated with 2-3 ml. of 1% Lidocaine without Epinephrine.  The spinal needle was inserted toward the target using a "trajectory" view along the fluoroscope beam.  Under AP and lateral visualization, the needle was advanced so it did not puncture dura and was located close the 6 O'Clock position of the pedical in AP tracterory. Biplanar projections were used to confirm position. Aspiration was confirmed to be negative for CSF and/or blood. A 1-2 ml. volume of Isovue-250 was injected and flow of contrast was noted at each level. Radiographs were obtained for  documentation purposes.   After attaining the desired flow of contrast documented above, a 0.5 to 1.0 ml test dose of 0.25% Marcaine was injected into each respective transforaminal space.  The patient was observed for 90 seconds post injection.  After no sensory deficits were reported, and normal lower extremity motor function was noted,   the above injectate was administered so that equal amounts of the injectate were placed at each foramen (level) into the transforaminal epidural space.   Additional Comments:  The patient tolerated the procedure well Dressing: 2 x 2 sterile gauze and Band-Aid    Post-procedure details: Patient was observed during the procedure. Post-procedure instructions were reviewed.  Patient left the clinic in stable condition.

## 2020-11-10 NOTE — Progress Notes (Signed)
Cassandra Brooks - 80 y.o. adult MRN [id number]  Date of birth: June 04, 1940  Office Visit Note: Visit Date: 11/05/2020 PCP: Lowella Dandy, NP Referred by: Lowella Dandy, NP  Subjective: Chief Complaint  Patient presents with   Lower Back - Pain   HPI:  Cassandra Brooks is a 80 y.o. adult who comes in today for planned Bilateral L4-L5 Lumbar Transforaminal epidural steroid injection with fluoroscopic guidance.  The patient has failed conservative care including home exercise, medications, time and activity modification.  This injection will be diagnostic and hopefully therapeutic.  Please see requesting physician notes for further details and justification.   ROS Otherwise per HPI.  Assessment & Plan: Visit Diagnoses:    ICD-10-CM   1. Spinal stenosis of lumbar region with neurogenic claudication  M48.062 XR C-ARM NO REPORT    Epidural Steroid injection    methylPREDNISolone acetate (DEPO-MEDROL) injection 80 mg    2. Lumbar radiculopathy  M54.16 XR C-ARM NO REPORT    Epidural Steroid injection    methylPREDNISolone acetate (DEPO-MEDROL) injection 80 mg      Plan: No additional findings.   Meds & Orders:  Meds ordered this encounter  Medications   methylPREDNISolone acetate (DEPO-MEDROL) injection 80 mg    Orders Placed This Encounter  Procedures   XR C-ARM NO REPORT   Epidural Steroid injection    Follow-up: No follow-ups on file.   Procedures: No procedures performed  Lumbosacral Transforaminal Epidural Steroid Injection - Sub-Pedicular Approach with Fluoroscopic Guidance  Patient: Cassandra Brooks      Date of Birth: [birth year] MRN: [id number] PCP: Lowella Dandy, NP      Visit Date: 11/05/2020   Universal Protocol:    Date/Time: 11/05/2020  Consent Given By: the patient  Position: PRONE  Additional Comments: Vital signs were monitored before and after the procedure. Patient was prepped and draped in the usual sterile fashion. The correct patient, procedure,  and site was verified.   Injection Procedure Details:   Procedure diagnoses: Spinal stenosis of lumbar region with neurogenic claudication [M48.062]    Meds Administered:  Meds ordered this encounter  Medications   methylPREDNISolone acetate (DEPO-MEDROL) injection 80 mg    Laterality: Bilateral  Location/Site:  L4-L5  Needle:5.0 in., 22 ga.  Short bevel or Quincke spinal needle  Needle Placement: Transforaminal  Findings:    -Comments: Excellent flow of contrast along the nerve, nerve root and into the epidural space.  Procedure Details: After squaring off the end-plates to get a true AP view, the C-arm was positioned so that an oblique view of the foramen as noted above was visualized. The target area is just inferior to the "nose of the scotty dog" or sub pedicular. The soft tissues overlying this structure were infiltrated with 2-3 ml. of 1% Lidocaine without Epinephrine.  The spinal needle was inserted toward the target using a "trajectory" view along the fluoroscope beam.  Under AP and lateral visualization, the needle was advanced so it did not puncture dura and was located close the 6 O'Clock position of the pedical in AP tracterory. Biplanar projections were used to confirm position. Aspiration was confirmed to be negative for CSF and/or blood. A 1-2 ml. volume of Isovue-250 was injected and flow of contrast was noted at each level. Radiographs were obtained for documentation purposes.   After attaining the desired flow of contrast documented above, a 0.5 to 1.0 ml test dose of 0.25% Marcaine was injected into each respective transforaminal space.  The patient was  observed for 90 seconds post injection.  After no sensory deficits were reported, and normal lower extremity motor function was noted,   the above injectate was administered so that equal amounts of the injectate were placed at each foramen (level) into the transforaminal epidural space.   Additional Comments:   The patient tolerated the procedure well Dressing: 2 x 2 sterile gauze and Band-Aid    Post-procedure details: Patient was observed during the procedure. Post-procedure instructions were reviewed.  Patient left the clinic in stable condition.     Clinical History: MRI LUMBAR SPINE WITHOUT CONTRAST   TECHNIQUE: Multiplanar, multisequence MR imaging of the lumbar spine was performed. No intravenous contrast was administered.   COMPARISON:  2018   FINDINGS: Segmentation:  Standard.   Alignment:  Similar mild retrolisthesis at L2-L3.   Vertebrae: Similar vertebral body heights with multilevel degenerative endplate irregularity. Minor degenerative endplate marrow edema at L3-L4 and L4-L5. No suspicious osseous lesion.   Conus medullaris and cauda equina: Conus extends to the L1-L2 level. Conus and cauda equina appear normal.   Paraspinal and other soft tissues: Unremarkable.   Disc levels:   L1-L2:  No canal or foraminal stenosis.   L2-L3: Disc bulge and facet arthropathy with ligamentum flavum infolding. No canal stenosis. Minor foraminal stenosis.   L3-L4: Disc bulge with prominent foraminal components and facet arthropathy with joint effusion and ligamentum flavum infolding. Increased moderate canal stenosis. Persistent narrowing of the lateral recesses. Similar mild foraminal stenosis.   L4-L5: Disc bulge with endplate osteophytic ridging and left greater than right facet arthropathy with ligamentum flavum infolding. Similar minor canal stenosis. Slight effacement of the lateral recesses. Increased moderate foraminal stenosis.   L5-S1: Disc bulge with endplate osteophytic ridging and facet arthropathy. No canal stenosis. Similar mild right and increased moderate left foraminal stenosis.   IMPRESSION: Multilevel degenerative changes as detailed above with some progression since 2018.     Electronically Signed   By: Macy Mis M.D.   On: 11/11/2019  08:33     Objective:  VS:  HT:    WT:   BMI:     BP:127/80  HR:75bpm  TEMP: ( )  RESP:  Physical Exam Vitals and nursing note reviewed.  Constitutional:      General: She is not in acute distress.    Appearance: Normal appearance. She is not ill-appearing.  HENT:     Head: Normocephalic and atraumatic.     Right Ear: External ear normal.     Left Ear: External ear normal.     Nose: No congestion.  Eyes:     Extraocular Movements: Extraocular movements intact.  Cardiovascular:     Rate and Rhythm: Normal rate.     Pulses: Normal pulses.  Pulmonary:     Effort: Pulmonary effort is normal. No respiratory distress.  Abdominal:     General: There is no distension.     Palpations: Abdomen is soft.  Musculoskeletal:        General: No tenderness or signs of injury.     Cervical back: Neck supple.     Right lower leg: No edema.     Left lower leg: No edema.     Comments: Patient has good distal strength without clonus.  Skin:    Findings: No erythema or rash.  Neurological:     General: No focal deficit present.     Mental Status: She is alert and oriented to person, place, and time.     Sensory: No  sensory deficit.     Motor: No weakness or abnormal muscle tone.     Coordination: Coordination normal.  Psychiatric:        Mood and Affect: Mood normal.        Behavior: Behavior normal.     Imaging: No results found.

## 2020-11-17 ENCOUNTER — Ambulatory Visit: Payer: Medicare HMO | Admitting: Physical Medicine and Rehabilitation

## 2020-12-01 DIAGNOSIS — M778 Other enthesopathies, not elsewhere classified: Secondary | ICD-10-CM | POA: Diagnosis not present

## 2020-12-01 DIAGNOSIS — M722 Plantar fascial fibromatosis: Secondary | ICD-10-CM | POA: Diagnosis not present

## 2020-12-01 DIAGNOSIS — M19071 Primary osteoarthritis, right ankle and foot: Secondary | ICD-10-CM | POA: Diagnosis not present

## 2020-12-16 ENCOUNTER — Telehealth: Payer: Self-pay | Admitting: Physical Medicine and Rehabilitation

## 2020-12-16 DIAGNOSIS — M48062 Spinal stenosis, lumbar region with neurogenic claudication: Secondary | ICD-10-CM

## 2020-12-16 NOTE — Telephone Encounter (Signed)
Pts husband Cassandra Brooks called on her behalf stating her back pain has come back. Pt got an injection at her last appt and that gave her relief for a couple days but the pain came back. Pt would like a CB to be advised what Dr.Newton wants her to do.   [phone]

## 2020-12-16 NOTE — Telephone Encounter (Signed)
Bilateral L4 TF on 7/28. Please advise.

## 2020-12-16 NOTE — Telephone Encounter (Signed)
Referral placed. Patient's husband notified that we will call to schedule after authorized by insurance.

## 2020-12-21 ENCOUNTER — Ambulatory Visit: Payer: Medicare HMO | Admitting: Cardiology

## 2020-12-22 ENCOUNTER — Telehealth: Payer: Self-pay | Admitting: Physical Medicine and Rehabilitation

## 2020-12-22 NOTE — Telephone Encounter (Signed)
Error

## 2020-12-25 DIAGNOSIS — R7301 Impaired fasting glucose: Secondary | ICD-10-CM | POA: Diagnosis not present

## 2020-12-25 DIAGNOSIS — Z6831 Body mass index (BMI) 31.0-31.9, adult: Secondary | ICD-10-CM | POA: Diagnosis not present

## 2020-12-25 DIAGNOSIS — E785 Hyperlipidemia, unspecified: Secondary | ICD-10-CM | POA: Diagnosis not present

## 2020-12-25 DIAGNOSIS — Z79899 Other long term (current) drug therapy: Secondary | ICD-10-CM | POA: Diagnosis not present

## 2020-12-25 DIAGNOSIS — R69 Illness, unspecified: Secondary | ICD-10-CM | POA: Diagnosis not present

## 2020-12-25 DIAGNOSIS — K219 Gastro-esophageal reflux disease without esophagitis: Secondary | ICD-10-CM | POA: Diagnosis not present

## 2020-12-25 DIAGNOSIS — I10 Essential (primary) hypertension: Secondary | ICD-10-CM | POA: Diagnosis not present

## 2020-12-25 DIAGNOSIS — M1990 Unspecified osteoarthritis, unspecified site: Secondary | ICD-10-CM | POA: Diagnosis not present

## 2020-12-28 ENCOUNTER — Ambulatory Visit: Payer: Medicare HMO | Admitting: Physical Medicine and Rehabilitation

## 2020-12-30 ENCOUNTER — Encounter: Payer: Self-pay | Admitting: Physical Medicine and Rehabilitation

## 2020-12-30 ENCOUNTER — Other Ambulatory Visit: Payer: Self-pay

## 2020-12-30 ENCOUNTER — Ambulatory Visit: Payer: Self-pay

## 2020-12-30 ENCOUNTER — Ambulatory Visit (INDEPENDENT_AMBULATORY_CARE_PROVIDER_SITE_OTHER): Payer: Medicare HMO | Admitting: Physical Medicine and Rehabilitation

## 2020-12-30 VITALS — BP 132/76 | HR 85

## 2020-12-30 DIAGNOSIS — M48062 Spinal stenosis, lumbar region with neurogenic claudication: Secondary | ICD-10-CM | POA: Diagnosis not present

## 2020-12-30 MED ORDER — BETAMETHASONE SOD PHOS & ACET 6 (3-3) MG/ML IJ SUSP
12.0000 mg | Freq: Once | INTRAMUSCULAR | Status: AC
  Administered 2020-12-30: 12 mg

## 2020-12-30 NOTE — Patient Instructions (Signed)

## 2020-12-30 NOTE — Progress Notes (Signed)
Pt state lower back pain that travels to her buttocks. Pt state walking, Standing and bending over makes the pain worse. Pt state she takes over the counter pain meds and uses heating to help ease her pain. Pt has hx of inj on 11/05/20 pt state it helped a little for a few days.  Numeric Pain Rating Scale and Functional Assessment Average Pain 10   In the last MONTH (on 0-10 scale) has pain interfered with the following?  1. General activity like being  able to carry out your everyday physical activities such as walking, climbing stairs, carrying groceries, or moving a chair?  Rating(10)   +Driver, -BT, -Dye Allergies.

## 2021-01-08 NOTE — Progress Notes (Signed)
Cassandra Brooks - 80 y.o. adult MRN [id number]  Date of birth: 1940/05/06  Office Visit Note: Visit Date: 12/30/2020 PCP: Lowella Dandy, NP Referred by: Lowella Dandy, NP  Subjective: Chief Complaint  Patient presents with   Lower Back - Pain   HPI:  Cassandra Brooks is a 80 y.o. adult who comes in today For planned left L3-4 interlaminar epidural steroid injection for what is moderate canal stenosis and multilevel spondylosis.  She is done reasonably well in the past with intermittent epidural injections therapeutically.  This year we have completed 3 injections but her insurance wanted to deny this injection saying she had had 5 and maybe they were talking about in a certain time.  Instead of physical year.  She did have 1 back in November of last year.  We really try to limit the injections with folks as long as they are getting better and good relief that is sustained per [employer]'s guidelines.  She meets all other criteria.  We did appeal this with a peer to peer review and they did let us go ahead and complete an injection today.  I do want a make the point that this is an interlaminar injection which is really not a repeat from the last injection which was a transforaminal approach.  In my mind this is a diagnostic and therapeutic attempt being that it is a different approach.  Regardless hopefully she does well.  She will continue with home exercise program and medications.  All injections are completed with fluoroscopic guidance as part of a multimodal approach to pain relief.  This includes access to physical therapy and biopsychosocial counseling as well as orthopedic surgery.  ROS Otherwise per HPI.  Assessment & Plan: Visit Diagnoses:    ICD-10-CM   1. Spinal stenosis of lumbar region with neurogenic claudication  M48.062 XR C-ARM NO REPORT    Epidural Steroid injection    betamethasone acetate-betamethasone sodium phosphate (CELESTONE) injection 12 mg      Plan: No additional  findings.   Meds & Orders:  Meds ordered this encounter  Medications   betamethasone acetate-betamethasone sodium phosphate (CELESTONE) injection 12 mg    Orders Placed This Encounter  Procedures   XR C-ARM NO REPORT   Epidural Steroid injection    Follow-up: Return if symptoms worsen or fail to improve.   Procedures: No procedures performed  Lumbar Epidural Steroid Injection - Interlaminar Approach with Fluoroscopic Guidance  Patient: Cassandra Brooks      Date of Birth: [birth year] MRN: [id number] PCP: Lowella Dandy, NP      Visit Date: 12/30/2020   Universal Protocol:     Consent Given By: the patient  Position: PRONE  Additional Comments: Vital signs were monitored before and after the procedure. Patient was prepped and draped in the usual sterile fashion. The correct patient, procedure, and site was verified.   Injection Procedure Details:   Procedure diagnoses: Spinal stenosis of lumbar region with neurogenic claudication [M48.062]   Meds Administered:  Meds ordered this encounter  Medications   betamethasone acetate-betamethasone sodium phosphate (CELESTONE) injection 12 mg     Laterality: Left  Location/Site:  L3-4  Needle: 3.5 in., 20 ga. Tuohy  Needle Placement: Paramedian epidural  Findings:   -Comments: Excellent flow of contrast into the epidural space.  Procedure Details: Using a paramedian approach from the side mentioned above, the region overlying the inferior lamina was localized under fluoroscopic visualization and the soft tissues overlying this structure were infiltrated with  4 ml. of 1% Lidocaine without Epinephrine. The Tuohy needle was inserted into the epidural space using a paramedian approach.   The epidural space was localized using loss of resistance along with counter oblique bi-planar fluoroscopic views.  After negative aspirate for air, blood, and CSF, a 2 ml. volume of Isovue-250 was injected into the epidural space and the flow  of contrast was observed. Radiographs were obtained for documentation purposes.    The injectate was administered into the level noted above.   Additional Comments:  The patient tolerated the procedure well Dressing: 2 x 2 sterile gauze and Band-Aid    Post-procedure details: Patient was observed during the procedure. Post-procedure instructions were reviewed.  Patient left the clinic in stable condition.   Clinical History: MRI LUMBAR SPINE WITHOUT CONTRAST   TECHNIQUE: Multiplanar, multisequence MR imaging of the lumbar spine was performed. No intravenous contrast was administered.   COMPARISON:  2018   FINDINGS: Segmentation:  Standard.   Alignment:  Similar mild retrolisthesis at L2-L3.   Vertebrae: Similar vertebral body heights with multilevel degenerative endplate irregularity. Minor degenerative endplate marrow edema at L3-L4 and L4-L5. No suspicious osseous lesion.   Conus medullaris and cauda equina: Conus extends to the L1-L2 level. Conus and cauda equina appear normal.   Paraspinal and other soft tissues: Unremarkable.   Disc levels:   L1-L2:  No canal or foraminal stenosis.   L2-L3: Disc bulge and facet arthropathy with ligamentum flavum infolding. No canal stenosis. Minor foraminal stenosis.   L3-L4: Disc bulge with prominent foraminal components and facet arthropathy with joint effusion and ligamentum flavum infolding. Increased moderate canal stenosis. Persistent narrowing of the lateral recesses. Similar mild foraminal stenosis.   L4-L5: Disc bulge with endplate osteophytic ridging and left greater than right facet arthropathy with ligamentum flavum infolding. Similar minor canal stenosis. Slight effacement of the lateral recesses. Increased moderate foraminal stenosis.   L5-S1: Disc bulge with endplate osteophytic ridging and facet arthropathy. No canal stenosis. Similar mild right and increased moderate left foraminal stenosis.    IMPRESSION: Multilevel degenerative changes as detailed above with some progression since 2018.     Electronically Signed   By: Macy Mis M.D.   On: 11/11/2019 08:33     Objective:  VS:  HT:    WT:   BMI:     BP:132/76  HR:85bpm  TEMP: ( )  RESP:  Physical Exam Vitals and nursing note reviewed.  Constitutional:      General: She is not in acute distress.    Appearance: Normal appearance. She is not ill-appearing.  HENT:     Head: Normocephalic and atraumatic.     Right Ear: External ear normal.     Left Ear: External ear normal.     Nose: No congestion.  Eyes:     Extraocular Movements: Extraocular movements intact.  Cardiovascular:     Rate and Rhythm: Normal rate.     Pulses: Normal pulses.  Pulmonary:     Effort: Pulmonary effort is normal. No respiratory distress.  Abdominal:     General: There is no distension.     Palpations: Abdomen is soft.  Musculoskeletal:        General: No tenderness or signs of injury.     Cervical back: Neck supple.     Right lower leg: No edema.     Left lower leg: No edema.     Comments: Patient has good distal strength without clonus.  Skin:    Findings: No  erythema or rash.  Neurological:     General: No focal deficit present.     Mental Status: She is alert and oriented to person, place, and time.     Sensory: No sensory deficit.     Motor: No weakness or abnormal muscle tone.     Coordination: Coordination normal.  Psychiatric:        Mood and Affect: Mood normal.        Behavior: Behavior normal.     Imaging: No results found.

## 2021-01-08 NOTE — Procedures (Signed)
Lumbar Epidural Steroid Injection - Interlaminar Approach with Fluoroscopic Guidance  Patient: Cassandra Brooks      Date of Birth: [birth year] MRN: [id number] PCP: Lowella Dandy, NP      Visit Date: 12/30/2020   Universal Protocol:     Consent Given By: the patient  Position: PRONE  Additional Comments: Vital signs were monitored before and after the procedure. Patient was prepped and draped in the usual sterile fashion. The correct patient, procedure, and site was verified.   Injection Procedure Details:   Procedure diagnoses: Spinal stenosis of lumbar region with neurogenic claudication [M48.062]   Meds Administered:  Meds ordered this encounter  Medications   betamethasone acetate-betamethasone sodium phosphate (CELESTONE) injection 12 mg     Laterality: Left  Location/Site:  L3-4  Needle: 3.5 in., 20 ga. Tuohy  Needle Placement: Paramedian epidural  Findings:   -Comments: Excellent flow of contrast into the epidural space.  Procedure Details: Using a paramedian approach from the side mentioned above, the region overlying the inferior lamina was localized under fluoroscopic visualization and the soft tissues overlying this structure were infiltrated with 4 ml. of 1% Lidocaine without Epinephrine. The Tuohy needle was inserted into the epidural space using a paramedian approach.   The epidural space was localized using loss of resistance along with counter oblique bi-planar fluoroscopic views.  After negative aspirate for air, blood, and CSF, a 2 ml. volume of Isovue-250 was injected into the epidural space and the flow of contrast was observed. Radiographs were obtained for documentation purposes.    The injectate was administered into the level noted above.   Additional Comments:  The patient tolerated the procedure well Dressing: 2 x 2 sterile gauze and Band-Aid    Post-procedure details: Patient was observed during the procedure. Post-procedure instructions  were reviewed.  Patient left the clinic in stable condition.

## 2021-01-20 ENCOUNTER — Ambulatory Visit: Payer: Medicare HMO | Admitting: Physical Medicine and Rehabilitation

## 2021-01-20 ENCOUNTER — Encounter: Payer: Self-pay | Admitting: Physical Medicine and Rehabilitation

## 2021-01-20 ENCOUNTER — Other Ambulatory Visit: Payer: Self-pay

## 2021-01-20 VITALS — BP 143/77 | HR 76

## 2021-01-20 DIAGNOSIS — M797 Fibromyalgia: Secondary | ICD-10-CM

## 2021-01-20 DIAGNOSIS — M47816 Spondylosis without myelopathy or radiculopathy, lumbar region: Secondary | ICD-10-CM

## 2021-01-20 DIAGNOSIS — G894 Chronic pain syndrome: Secondary | ICD-10-CM | POA: Diagnosis not present

## 2021-01-20 DIAGNOSIS — M5416 Radiculopathy, lumbar region: Secondary | ICD-10-CM | POA: Diagnosis not present

## 2021-01-20 DIAGNOSIS — M48062 Spinal stenosis, lumbar region with neurogenic claudication: Secondary | ICD-10-CM | POA: Diagnosis not present

## 2021-01-20 NOTE — Progress Notes (Signed)
50% relief from last injection. States that she has good days and bad day. Pain is 6 or 7 on bad days and 3 or 4 on good days. Numeric Pain Rating Scale and Functional Assessment Average Pain 5   In the last MONTH (on 0-10 scale) has pain interfered with the following?  1. General activity like being  able to carry out your everyday physical activities such as walking, climbing stairs, carrying groceries, or moving a chair?  Rating(7)

## 2021-01-20 NOTE — Progress Notes (Signed)
Cassandra Brooks - 80 y.o. adult MRN [id number]  Date of birth: 01/10/1941  Office Visit Note: Visit Date: 01/20/2021 PCP: Lowella Dandy, NP Referred by: Lowella Dandy, NP  Subjective: Chief Complaint  Patient presents with   Lower Back - Pain   HPI: Cassandra Brooks is a 80 y.o. adult who comes in today for evaluation of chronic bilateral lateral lower back pain radiating to hips and down legs.  Patient had left L3-L4 interlaminar epidural steroid injection on 12/30/2020 which she reports gave her more than 50% pain relief and is continuing to sustain.  Patient states she does have days where her pain is worse than others, however she feels like it is manageable at home at this point.  Patient states her pain is exacerbated by walking, prolonged standing and activity.  Patient describes pain as soreness sensation and currently rates as 2 out of 10.  Patient states her pain is relieved with rest and reports she has not taken Tramadol since her recent injection.  Patient's lumbar MRI from 2021 exhibits multilevel degenerative changes, disc bulging and foraminal stenosis.  No high-grade spinal canal stenosis noted. Patient also reports she feels that her walking has improved and is not requiring a cane to assist with ambulation.  Overall, patient feels that her functionality has improved and is able to perform daily activities without issues.  Patient does have an upcoming appointment next week with Dr. Basil Dess where she is going to discuss possible surgical options and further treatments for her chronic bilateral lower back issues.  Patient denies focal weakness, numbness and tingling.  Patient denies recent trauma or falls.  Review of Systems  Musculoskeletal:  Positive for back pain.  Neurological:  Negative for tingling, sensory change, focal weakness and weakness.  All other systems reviewed and are negative. Otherwise per HPI.  Assessment & Plan: Visit Diagnoses:    ICD-10-CM   1. Lumbar  radiculopathy  M54.16     2. Spondylosis without myelopathy or radiculopathy, lumbar region  M47.816     3. Spinal stenosis of lumbar region with neurogenic claudication  M48.062     4. Fibromyalgia  M79.7     5. Chronic pain syndrome  G89.4        Plan: Findings:  Chronic recalcitrant bilateral lower back pain radiating to hips and down legs.  Patient received significant and sustained pain relief from recent left L3-L4 interlaminar epidural steroid injection.  Patient continues to do well post injection and is managing her pain at home as needed.  Patient encouraged to remain active as tolerated.  If she continues to do well with her pain relief we will monitor closely.  Patient does have a upcoming appointment with Dr. Basil Dess to discuss possible surgical options and further treatments.  Patient instructed to let us know how she is doing and if her pain does return.  We are hopeful that her pain relief will sustain and would consider performing repeat injection if warranted.  No red flags noted today upon exam.   Meds & Orders: No orders of the defined types were placed in this encounter.  No orders of the defined types were placed in this encounter.   Follow-up: Return if symptoms worsen or fail to improve.   Procedures: No procedures performed      Clinical History: MRI LUMBAR SPINE WITHOUT CONTRAST   TECHNIQUE: Multiplanar, multisequence MR imaging of the lumbar spine was performed. No intravenous contrast was administered.   COMPARISON:  2018   FINDINGS: Segmentation:  Standard.   Alignment:  Similar mild retrolisthesis at L2-L3.   Vertebrae: Similar vertebral body heights with multilevel degenerative endplate irregularity. Minor degenerative endplate marrow edema at L3-L4 and L4-L5. No suspicious osseous lesion.   Conus medullaris and cauda equina: Conus extends to the L1-L2 level. Conus and cauda equina appear normal.   Paraspinal and other soft tissues:  Unremarkable.   Disc levels:   L1-L2:  No canal or foraminal stenosis.   L2-L3: Disc bulge and facet arthropathy with ligamentum flavum infolding. No canal stenosis. Minor foraminal stenosis.   L3-L4: Disc bulge with prominent foraminal components and facet arthropathy with joint effusion and ligamentum flavum infolding. Increased moderate canal stenosis. Persistent narrowing of the lateral recesses. Similar mild foraminal stenosis.   L4-L5: Disc bulge with endplate osteophytic ridging and left greater than right facet arthropathy with ligamentum flavum infolding. Similar minor canal stenosis. Slight effacement of the lateral recesses. Increased moderate foraminal stenosis.   L5-S1: Disc bulge with endplate osteophytic ridging and facet arthropathy. No canal stenosis. Similar mild right and increased moderate left foraminal stenosis.   IMPRESSION: Multilevel degenerative changes as detailed above with some progression since 2018.     Electronically Signed   By: Macy Mis M.D.   On: 11/11/2019 08:33   She reports that she has never smoked. She has never used smokeless tobacco.  Recent Labs    03/24/20 0904  HGBA1C 5.5    Objective:  VS:  HT:    WT:   BMI:     BP: (!) 143/77  HR:76bpm  TEMP: ( )  RESP:  Physical Exam Vitals and nursing note reviewed.  HENT:     Head: Normocephalic and atraumatic.     Right Ear: External ear normal.     Left Ear: External ear normal.     Nose: Nose normal.     Mouth/Throat:     Mouth: Mucous membranes are moist.  Eyes:     Extraocular Movements: Extraocular movements intact.  Cardiovascular:     Rate and Rhythm: Normal rate.     Pulses: Normal pulses.  Pulmonary:     Effort: Pulmonary effort is normal.  Abdominal:     General: Abdomen is flat. There is no distension.  Musculoskeletal:        General: Tenderness present.     Cervical back: Normal range of motion.     Comments: Pt is slow to rise from seated  position to standing. Good lumbar range of motion. Strong distal strength without clonus, no pain upon palpation of greater trochanters. Sensation intact bilaterally. Walks independently, gait steady.     Skin:    General: Skin is warm and dry.     Capillary Refill: Capillary refill takes less than 2 seconds.  Neurological:     General: No focal deficit present.     Mental Status: She is alert and oriented to person, place, and time.  Psychiatric:        Mood and Affect: Mood normal.    Ortho Exam  Imaging: No results found.  Past Medical/Family/Surgical/Social History: Medications & Allergies reviewed per EMR, new medications updated. Patient Active Problem List   Diagnosis Date Noted   Pre-diabetes    Status post total left knee replacement 03/27/2020   Essential hypertension 12/19/2019   Mixed dyslipidemia 12/19/2019   Diabetes mellitus due to underlying condition with unspecified complications ([place]) 73/53/2992   Preoperative cardiovascular examination 12/19/2019   Status post AAA (abdominal  aortic aneurysm) repair 12/19/2019   Syncope and collapse    Hypertension    High cholesterol    Glaucoma, both eyes    GERD (gastroesophageal reflux disease)    Fibromyalgia    Depression    Bulging lumbar disc    Borderline diabetes    Arthritis    AAA (abdominal aortic aneurysm)    Primary osteoarthritis of left knee 07/18/2019   Disequilibrium    Syncope 03/21/2017   Dizziness    Fall    Hemoptysis 03/18/2015   Solitary pulmonary nodule 03/18/2015   Nuclear cataract of both eyes 01/01/2014   Past Medical History:  Diagnosis Date   AAA (abdominal aortic aneurysm)    Arthritis    "knees" (03/21/2017)   Borderline diabetes    Bulging lumbar disc    Depression    Diabetes mellitus due to underlying condition with unspecified complications ([place]) 05/16/537   Disequilibrium    Dizziness    Essential hypertension 12/19/2019   Fall    Fibromyalgia    GERD (gastroesophageal  reflux disease)    Glaucoma, both eyes    Hemoptysis 03/18/2015   High cholesterol    "don't have it but I take RX" (03/21/2017)   Hypertension    Mixed dyslipidemia 12/19/2019   Nuclear cataract of both eyes 01/01/2014   Pre-diabetes    Preoperative cardiovascular examination 12/19/2019   Primary osteoarthritis of left knee 07/18/2019   Solitary pulmonary nodule 03/18/2015   03/2015 70mm RLL nodule    Status post AAA (abdominal aortic aneurysm) repair 12/19/2019   Status post total left knee replacement 03/27/2020   Syncope 03/21/2017   Syncope and collapse    Family History  Problem Relation Age of Onset   Hypertension Father    Stomach cancer Father    Glaucoma Mother    Diabetes Mother    Diabetes Sister    Diabetes Brother    Lupus Child    Diabetes Brother    Lung cancer Brother    Colon cancer Brother    Past Surgical History:  Procedure Laterality Date   ABDOMINAL AORTIC ANEURYSM REPAIR  1966   APPENDECTOMY  1966   COLONOSCOPY     TOTAL KNEE ARTHROPLASTY Left 03/27/2020   Procedure: LEFT TOTAL KNEE ARTHROPLASTY;  Surgeon: Leandrew Koyanagi, MD;  Location: [facility];  Service: Orthopedics;  Laterality: Left;   VAGINAL HYSTERECTOMY     "partial"   VIDEO BRONCHOSCOPY Bilateral 03/19/2015   Procedure: VIDEO BRONCHOSCOPY WITHOUT FLUORO;  Surgeon: Rigoberto Noel, MD;  Location: [facility];  Service: Cardiopulmonary;  Laterality: Bilateral;   Social History   Occupational History   Occupation: retired    Comment: Charity fundraiser; dietician  Tobacco Use   Smoking status: Never   Smokeless tobacco: Never  Vaping Use   Vaping Use: Never used  Substance and Sexual Activity   Alcohol use: No   Drug use: No   Sexual activity: Not on file

## 2021-01-21 DIAGNOSIS — H04123 Dry eye syndrome of bilateral lacrimal glands: Secondary | ICD-10-CM | POA: Diagnosis not present

## 2021-01-21 DIAGNOSIS — H401133 Primary open-angle glaucoma, bilateral, severe stage: Secondary | ICD-10-CM | POA: Diagnosis not present

## 2021-01-27 ENCOUNTER — Ambulatory Visit: Payer: Medicare HMO | Admitting: Specialist

## 2021-01-29 ENCOUNTER — Other Ambulatory Visit: Payer: Self-pay

## 2021-01-29 ENCOUNTER — Encounter: Payer: Self-pay | Admitting: Specialist

## 2021-01-29 ENCOUNTER — Ambulatory Visit: Payer: Medicare HMO | Admitting: Specialist

## 2021-01-29 VITALS — BP 168/77 | HR 71 | Ht 60.75 in | Wt 168.8 lb

## 2021-01-29 DIAGNOSIS — M47816 Spondylosis without myelopathy or radiculopathy, lumbar region: Secondary | ICD-10-CM

## 2021-01-29 DIAGNOSIS — M2170 Unequal limb length (acquired), unspecified site: Secondary | ICD-10-CM

## 2021-01-29 DIAGNOSIS — M4807 Spinal stenosis, lumbosacral region: Secondary | ICD-10-CM

## 2021-01-29 DIAGNOSIS — M48062 Spinal stenosis, lumbar region with neurogenic claudication: Secondary | ICD-10-CM

## 2021-01-29 NOTE — Progress Notes (Signed)
Office Visit Note   Patient: Cassandra Brooks           Date of Birth: Feb 07, 1941           MRN: [id number] Visit Date: 01/29/2021              Requested by: Lowella Dandy, NP [place],  [place] [address] PCP: Lowella Dandy, NP   Assessment & Plan: Visit Diagnoses:  1. Spinal stenosis of lumbar region with neurogenic claudication   2. Spinal stenosis of lumbosacral region   3. Spondylosis without myelopathy or radiculopathy, lumbar region   4. Leg length discrepancy     Plan: Avoid bending, stooping and avoid lifting weights greater than 10 lbs. Avoid prolong standing and walking. Avoid frequent bending and stooping  No lifting greater than 10 lbs. May use ice or moist heat for pain. Weight loss is of benefit. Handicap license is approved. MRI lumbar spine due to previous study being over one year old and if the study confirms the lateral recess stenosis L3-4 and L4-5 she should consider right sided L3-4 and L4-5 lateral recess decompression for stenosis and neurogenic claudication.   Follow-Up Instructions: No follow-ups on file.   Orders:  No orders of the defined types were placed in this encounter.  No orders of the defined types were placed in this encounter.     Procedures: No procedures performed   Clinical Data: No additional findings.   Subjective: Chief Complaint  Patient presents with   Lower Back - Pain    80 year old with history of back pain and radiation into the right knee and thigh pain. It was worse with standing and walking. She has MRI from  11/2019 and based on that study underwent right L3 TF ESI with good improvement in her pain pattern to a pain of 2 of 10. The pain ha reccurred and she is requesting discussion as to what surgery can be considered.    Review of Systems  Constitutional: Negative.   HENT: Negative.    Eyes: Negative.   Respiratory: Negative.    Cardiovascular: Negative.   Gastrointestinal: Negative.    Endocrine: Negative.   Genitourinary: Negative.   Musculoskeletal: Negative.   Skin: Negative.   Allergic/Immunologic: Negative.   Neurological: Negative.   Hematological: Negative.   Psychiatric/Behavioral: Negative.      Objective: Vital Signs: BP (!) 168/77 (BP Location: Right Arm, Patient Position: Sitting, Cuff Size: Normal)   Pulse 71   Ht 5' 0.75" (1.543 m)   Wt 168 lb 12.8 oz (76.6 kg)   BMI 32.16 kg/m   Physical Exam Constitutional:      Appearance: She is well-developed.  HENT:     Head: Normocephalic and atraumatic.  Eyes:     Pupils: Pupils are equal, round, and reactive to light.  Pulmonary:     Effort: Pulmonary effort is normal.     Breath sounds: Normal breath sounds.  Abdominal:     General: Bowel sounds are normal.     Palpations: Abdomen is soft.  Musculoskeletal:     Cervical back: Normal range of motion and neck supple.     Lumbar back: Negative right straight leg raise test and negative left straight leg raise test.  Skin:    General: Skin is warm and dry.  Neurological:     Mental Status: She is alert and oriented to person, place, and time.  Psychiatric:  Behavior: Behavior normal.        Thought Content: Thought content normal.        Judgment: Judgment normal.    Back Exam   Tenderness  The patient is experiencing tenderness in the lumbar.  Range of Motion  Extension:  abnormal  Flexion:  abnormal  Lateral bend right:  abnormal  Lateral bend left:  abnormal  Rotation right:  abnormal  Rotation left:  abnormal   Muscle Strength  Right Quadriceps:  4/5  Left Quadriceps:  5/5  Right Hamstrings:  5/5  Left Hamstrings:  5/5   Tests  Straight leg raise right: negative Straight leg raise left: negative  Reflexes  Achilles:  2/4 Biceps:  2/4  Other  Toe walk: normal Heel walk: normal     Specialty Comments:  No specialty comments available.  Imaging: No results found.   PMFS History: Patient Active  Problem List   Diagnosis Date Noted   Pre-diabetes    Status post total left knee replacement 03/27/2020   Essential hypertension 12/19/2019   Mixed dyslipidemia 12/19/2019   Diabetes mellitus due to underlying condition with unspecified complications ([facility]) 43/15/4008   Preoperative cardiovascular examination 12/19/2019   Status post AAA (abdominal aortic aneurysm) repair 12/19/2019   Syncope and collapse    Hypertension    High cholesterol    Glaucoma, both eyes    GERD (gastroesophageal reflux disease)    Fibromyalgia    Depression    Bulging lumbar disc    Borderline diabetes    Arthritis    AAA (abdominal aortic aneurysm)    Primary osteoarthritis of left knee 07/18/2019   Disequilibrium    Syncope 03/21/2017   Dizziness    Fall    Hemoptysis 03/18/2015   Solitary pulmonary nodule 03/18/2015   Nuclear cataract of both eyes 01/01/2014   Past Medical History:  Diagnosis Date   AAA (abdominal aortic aneurysm)    Arthritis    "knees" (03/21/2017)   Borderline diabetes    Bulging lumbar disc    Depression    Diabetes mellitus due to underlying condition with unspecified complications ([facility]) 09/15/6193   Disequilibrium    Dizziness    Essential hypertension 12/19/2019   Fall    Fibromyalgia    GERD (gastroesophageal reflux disease)    Glaucoma, both eyes    Hemoptysis 03/18/2015   High cholesterol    "don't have it but I take RX" (03/21/2017)   Hypertension    Mixed dyslipidemia 12/19/2019   Nuclear cataract of both eyes 01/01/2014   Pre-diabetes    Preoperative cardiovascular examination 12/19/2019   Primary osteoarthritis of left knee 07/18/2019   Solitary pulmonary nodule 03/18/2015   03/2015 64mm RLL nodule    Status post AAA (abdominal aortic aneurysm) repair 12/19/2019   Status post total left knee replacement 03/27/2020   Syncope 03/21/2017   Syncope and collapse     Family History  Problem Relation Age of Onset   Hypertension Father    Stomach cancer Father     Glaucoma Mother    Diabetes Mother    Diabetes Sister    Diabetes Brother    Lupus Child    Diabetes Brother    Lung cancer Brother    Colon cancer Brother     Past Surgical History:  Procedure Laterality Date   ABDOMINAL AORTIC ANEURYSM REPAIR  1966   APPENDECTOMY  1966   COLONOSCOPY     TOTAL KNEE ARTHROPLASTY Left 03/27/2020   Procedure: LEFT TOTAL  KNEE ARTHROPLASTY;  Surgeon: Leandrew Koyanagi, MD;  Location: [facility];  Service: Orthopedics;  Laterality: Left;   VAGINAL HYSTERECTOMY     "partial"   VIDEO BRONCHOSCOPY Bilateral 03/19/2015   Procedure: VIDEO BRONCHOSCOPY WITHOUT FLUORO;  Surgeon: Rigoberto Noel, MD;  Location: [facility];  Service: Cardiopulmonary;  Laterality: Bilateral;   Social History   Occupational History   Occupation: retired    Comment: Charity fundraiser; dietician  Tobacco Use   Smoking status: Never   Smokeless tobacco: Never  Vaping Use   Vaping Use: Never used  Substance and Sexual Activity   Alcohol use: No   Drug use: No   Sexual activity: Not on file

## 2021-01-29 NOTE — Patient Instructions (Signed)
Plan: Avoid bending, stooping and avoid lifting weights greater than 10 lbs. Avoid prolong standing and walking. Avoid frequent bending and stooping  No lifting greater than 10 lbs. May use ice or moist heat for pain. Weight loss is of benefit. Handicap license is approved. MRI lumbar spine due to previous study being over one year old and if the study confirms the lateral recess stenosis L3-4 and L4-5 she should consider right sided L3-4 and L4-5 lateral recess decompression for stenosis and neurogenic claudication.

## 2021-02-11 ENCOUNTER — Other Ambulatory Visit: Payer: Self-pay

## 2021-02-11 ENCOUNTER — Ambulatory Visit
Admission: RE | Admit: 2021-02-11 | Discharge: 2021-02-11 | Disposition: A | Discharge status: Home / Self Care | Payer: Medicare HMO | Source: Ambulatory Visit | Attending: Specialist | Admitting: Specialist

## 2021-02-11 DIAGNOSIS — M545 Low back pain, unspecified: Secondary | ICD-10-CM | POA: Diagnosis not present

## 2021-02-11 DIAGNOSIS — M4807 Spinal stenosis, lumbosacral region: Secondary | ICD-10-CM

## 2021-02-11 DIAGNOSIS — M48061 Spinal stenosis, lumbar region without neurogenic claudication: Secondary | ICD-10-CM | POA: Diagnosis not present

## 2021-02-12 DIAGNOSIS — Z20822 Contact with and (suspected) exposure to covid-19: Secondary | ICD-10-CM | POA: Diagnosis not present

## 2021-02-12 DIAGNOSIS — R051 Acute cough: Secondary | ICD-10-CM | POA: Diagnosis not present

## 2021-03-03 ENCOUNTER — Encounter: Payer: Self-pay | Admitting: Specialist

## 2021-03-03 ENCOUNTER — Ambulatory Visit: Payer: Medicare HMO | Admitting: Specialist

## 2021-03-03 ENCOUNTER — Other Ambulatory Visit: Payer: Self-pay

## 2021-03-03 VITALS — BP 136/83 | HR 88 | Ht 61.0 in | Wt 169.0 lb

## 2021-03-03 DIAGNOSIS — M47816 Spondylosis without myelopathy or radiculopathy, lumbar region: Secondary | ICD-10-CM | POA: Diagnosis not present

## 2021-03-03 DIAGNOSIS — M48062 Spinal stenosis, lumbar region with neurogenic claudication: Secondary | ICD-10-CM

## 2021-03-03 DIAGNOSIS — M4807 Spinal stenosis, lumbosacral region: Secondary | ICD-10-CM | POA: Diagnosis not present

## 2021-03-03 NOTE — Progress Notes (Addendum)
Office Visit Note   Patient: Cassandra Brooks           Date of Birth: 1940-07-25           MRN: [id number] Visit Date: 03/03/2021              Requested by: Lowella Dandy, NP [place],  [place] [address] PCP: Lowella Dandy, NP   Assessment & Plan: Visit Diagnoses:  1. Spinal stenosis of lumbar region with neurogenic claudication   2. Spinal stenosis of lumbosacral region   3. Spondylosis without myelopathy or radiculopathy, lumbar region     Plan: Avoid bending, stooping and avoid lifting weights greater than 10 lbs. Avoid prolong standing and walking. Order for a new walker with wheels. Surgery scheduling secretary Kandice Hams, will call you in the next week to schedule for surgery.  Surgery recommended is a two level lumbar laminectomy  L2-3 and L3-4 with foramenotomies, this would be done with the microscope. Take hydrocodone for for pain. Risk of surgery includes risk of infection 1 in 200 patients, bleeding 1/2% chance you would need a transfusion.   Risk to the nerves is one in 10,000.  Expect improved walking and standing tolerance. Expect relief of leg pain but numbness may persist depending on the length and degree of pressure that has been present.    Follow-Up Instructions: Return in about 4 weeks (around 03/31/2021).   Orders:  No orders of the defined types were placed in this encounter.  No orders of the defined types were placed in this encounter.     Procedures: No procedures performed   Clinical Data: Findings:  CLINICAL DATA:  Low back pain,  leg pain in a right L3 distribution   EXAM: MRI LUMBAR SPINE WITHOUT CONTRAST   TECHNIQUE: Multiplanar, multisequence MR imaging of the lumbar spine was performed. No intravenous contrast was administered.   COMPARISON:  01/11/2020   FINDINGS: Segmentation:  5 lumbar type vertebral bodies.   Alignment:  Slight increase in mild retrolisthesis of L2 on L3.   Vertebrae: No acute  fracture or suspicious osseous lesion. Interval development of Modic type 1 endplate degenerative changes at L2-L3 and L3-L4   Conus medullaris and cauda equina: Conus extends to the L2 level. Conus and cauda equina appear normal.   Paraspinal and other soft tissues: Negative.   Disc levels:   T12-L1: No significant disc bulge. No spinal canal stenosis or neural foraminal narrowing.   L1-L2: No significant disc bulge. No spinal canal stenosis or neural foraminal narrowing.   L2-L3: Progressive disc height loss with increased broad-based disc bulge, which narrows the lateral recesses. Mild-to-moderate facet arthropathy and ligamentum flavum hypertrophy. Moderate spinal canal stenosis, which has progressed from the prior exam. Mild left-greater-than-right neural foraminal narrowing.   L3-L4: Progressive disc height loss and increased disc bulge, which effaces the lateral recesses. Moderate to severe facet arthropathy and ligamentum flavum hypertrophy. Moderate to severe spinal canal stenosis, which has progressed from the prior exam. Mild-to-moderate left and mild right neural foraminal narrowing.   L4-L5: Disc osteophyte complex, left greater than right, unchanged. Narrowing of the lateral recesses. Mild facet arthropathy. No spinal canal stenosis. Moderate bilateral neural foraminal narrowing.   L5-S1: Mild disc bulge. Mild facet arthropathy. No spinal canal stenosis. Mild right and moderate left neural foraminal narrowing.   IMPRESSION: 1. L3-L4 moderate to severe spinal canal stenosis, which has progressed from the prior exam, with mild to moderate left and  mild right neural foraminal narrowing. Effacement of the lateral recesses at this level likely compresses the descending L4 nerves. 2. L2-L3 moderate spinal canal stenosis, which has progressed from the prior exam, with mild left-greater-than-right neural foraminal narrowing. Narrowing of the lateral recesses at this  level may affect the descending L3 nerves. 3. L4-L5 moderate bilateral neural foraminal narrowing, unchanged. 4. L5-S1 moderate left and mild right neural foraminal narrowing. 5. Progressive disc height loss L2-L4, with Modic type 1 endplate degenerative changes, which can also cause pain.     Electronically Signed   By: Merilyn Baba M.D.   On: 02/12/2021 11:41     Subjective: Chief Complaint  Patient presents with   Lower Back - Follow-up    MRI Review, states she got sick went to Dr and they gave her a round of prednisone and it also has helped her back pain    80 year old female with history of lumbar pain and left leg sciatica more than right. Pain is greater with standing and walking. She has relief with stooping and sitting. She has had ESIs for this and has had several of the last 2 years. She underwent recent ESIs by Dr. Ernestina Patches but the pain continues. She had severe left knee OA and underwent left TKR. Post op the stiffness in the left knee is better and the deformity is corrected but she is still limited in standing and walking and the back is painful. She had a recent bout of cough and head cold and this responded to steroids. She has had all vaccinations. Underwent   Review of Systems  Constitutional: Negative.   HENT:  Positive for congestion, rhinorrhea, sinus pressure and sinus pain.   Eyes: Negative.   Respiratory: Negative.    Cardiovascular: Negative.   Gastrointestinal: Negative.   Endocrine: Negative.   Genitourinary: Negative.   Musculoskeletal:  Positive for back pain and gait problem.  Allergic/Immunologic: Negative.   Neurological:  Positive for weakness and numbness.  Hematological: Negative.   Psychiatric/Behavioral:  Positive for sleep disturbance. Negative for agitation, behavioral problems, confusion, decreased concentration, dysphoric mood, hallucinations, self-injury and suicidal ideas. The patient is not nervous/anxious and is not hyperactive.      Objective: Vital Signs: BP 136/83 (BP Location: Left Arm, Patient Position: Sitting)   Pulse 88   Ht 5\' 1"  (1.549 m)   Wt 169 lb (76.7 kg)   BMI 31.93 kg/m   Physical Exam Constitutional:      Appearance: She is well-developed.  HENT:     Head: Normocephalic and atraumatic.  Eyes:     Pupils: Pupils are equal, round, and reactive to light.  Pulmonary:     Effort: Pulmonary effort is normal.     Breath sounds: Normal breath sounds.  Abdominal:     General: Bowel sounds are normal.     Palpations: Abdomen is soft.  Musculoskeletal:     Cervical back: Normal range of motion and neck supple.     Lumbar back: Negative right straight leg raise test and negative left straight leg raise test.  Skin:    General: Skin is warm and dry.  Neurological:     Mental Status: She is alert and oriented to person, place, and time.  Psychiatric:        Behavior: Behavior normal.        Thought Content: Thought content normal.        Judgment: Judgment normal.   Back Exam   Tenderness  The patient  is experiencing tenderness in the lumbar.  Range of Motion  Extension:  abnormal  Flexion:  abnormal  Lateral bend right:  abnormal  Lateral bend left:  abnormal  Rotation right:  normal  Rotation left:  normal   Muscle Strength  Right Quadriceps:  5/5  Left Quadriceps:  4/5  Right Hamstrings:  5/5  Left Hamstrings:  5/5   Tests  Straight leg raise right: negative Straight leg raise left: negative  Reflexes  Patellar:  0/4 abnormal Achilles:  1/4  Comments:  Difficulty straighten up with bending.      Specialty Comments:  No specialty comments available.  Imaging: No results found.   PMFS History: Patient Active Problem List   Diagnosis Date Noted   Pre-diabetes    Status post total left knee replacement 03/27/2020   Essential hypertension 12/19/2019   Mixed dyslipidemia 12/19/2019   Diabetes mellitus due to underlying condition with unspecified complications  ([facility]) 92/42/6834   Preoperative cardiovascular examination 12/19/2019   Status post AAA (abdominal aortic aneurysm) repair 12/19/2019   Syncope and collapse    Hypertension    High cholesterol    Glaucoma, both eyes    GERD (gastroesophageal reflux disease)    Fibromyalgia    Depression    Bulging lumbar disc    Borderline diabetes    Arthritis    AAA (abdominal aortic aneurysm)    Primary osteoarthritis of left knee 07/18/2019   Disequilibrium    Syncope 03/21/2017   Dizziness    Fall    Hemoptysis 03/18/2015   Solitary pulmonary nodule 03/18/2015   Nuclear cataract of both eyes 01/01/2014   Past Medical History:  Diagnosis Date   AAA (abdominal aortic aneurysm)    Arthritis    "knees" (03/21/2017)   Borderline diabetes    Bulging lumbar disc    Depression    Diabetes mellitus due to underlying condition with unspecified complications ([facility]) 04/19/6220   Disequilibrium    Dizziness    Essential hypertension 12/19/2019   Fall    Fibromyalgia    GERD (gastroesophageal reflux disease)    Glaucoma, both eyes    Hemoptysis 03/18/2015   High cholesterol    "don't have it but I take RX" (03/21/2017)   Hypertension    Mixed dyslipidemia 12/19/2019   Nuclear cataract of both eyes 01/01/2014   Pre-diabetes    Preoperative cardiovascular examination 12/19/2019   Primary osteoarthritis of left knee 07/18/2019   Solitary pulmonary nodule 03/18/2015   03/2015 62mm RLL nodule    Status post AAA (abdominal aortic aneurysm) repair 12/19/2019   Status post total left knee replacement 03/27/2020   Syncope 03/21/2017   Syncope and collapse     Family History  Problem Relation Age of Onset   Hypertension Father    Stomach cancer Father    Glaucoma Mother    Diabetes Mother    Diabetes Sister    Diabetes Brother    Lupus Child    Diabetes Brother    Lung cancer Brother    Colon cancer Brother     Past Surgical History:  Procedure Laterality Date   ABDOMINAL AORTIC ANEURYSM REPAIR   1966   APPENDECTOMY  1966   COLONOSCOPY     TOTAL KNEE ARTHROPLASTY Left 03/27/2020   Procedure: LEFT TOTAL KNEE ARTHROPLASTY;  Surgeon: Leandrew Koyanagi, MD;  Location: [facility];  Service: Orthopedics;  Laterality: Left;   VAGINAL HYSTERECTOMY     "partial"   VIDEO BRONCHOSCOPY Bilateral 03/19/2015  Procedure: VIDEO BRONCHOSCOPY WITHOUT FLUORO;  Surgeon: Rigoberto Noel, MD;  Location: [facility];  Service: Cardiopulmonary;  Laterality: Bilateral;   Social History   Occupational History   Occupation: retired    Comment: Charity fundraiser; dietician  Tobacco Use   Smoking status: Never   Smokeless tobacco: Never  Vaping Use   Vaping Use: Never used  Substance and Sexual Activity   Alcohol use: No   Drug use: No   Sexual activity: Not on file

## 2021-03-03 NOTE — Patient Instructions (Signed)
Plan: Avoid bending, stooping and avoid lifting weights greater than 10 lbs. Avoid prolong standing and walking. Order for a new walker with wheels. Surgery scheduling secretary Kandice Hams, will call you in the next week to schedule for surgery.  Surgery recommended is a two level lumbar laminectomy  L2-3 and L3-4 with foramenotomies, this would be done with the microscope. Take hydrocodone for for pain. Risk of surgery includes risk of infection 1 in 200 patients, bleeding 1/2% chance you would need a transfusion.   Risk to the nerves is one in 10,000.  Expect improved walking and standing tolerance. Expect relief of leg pain but numbness may persist depending on the length and degree of pressure that has been present.

## 2021-03-19 ENCOUNTER — Ambulatory Visit: Payer: Medicare HMO | Admitting: Orthopaedic Surgery

## 2021-03-19 ENCOUNTER — Encounter: Payer: Self-pay | Admitting: Orthopaedic Surgery

## 2021-03-19 ENCOUNTER — Ambulatory Visit (INDEPENDENT_AMBULATORY_CARE_PROVIDER_SITE_OTHER): Payer: Medicare HMO

## 2021-03-19 ENCOUNTER — Other Ambulatory Visit: Payer: Self-pay

## 2021-03-19 ENCOUNTER — Telehealth: Payer: Self-pay | Admitting: *Deleted

## 2021-03-19 DIAGNOSIS — Z96652 Presence of left artificial knee joint: Secondary | ICD-10-CM

## 2021-03-19 NOTE — Telephone Encounter (Signed)
Ortho bundle 1 year call completed. ?

## 2021-03-19 NOTE — Progress Notes (Signed)
Office Visit Note   Patient: Cassandra Brooks           Date of Birth: 1940/11/19           MRN: [id number] Visit Date: 03/19/2021              Requested by: Lowella Dandy, NP [place],  [place] [address] PCP: Lowella Dandy, NP   Assessment & Plan: Visit Diagnoses:  1. Status post total left knee replacement     Plan: 1 year TKA follow up plan  Patient is now one year out from a left total knee arthroplasty. Patient is doing very well and very pleased with the results. Radiographs reveal a total knee arthroplasty in good position, with no evidence of subsidence, loosening, or complicating features. It was reinforced that prophylactic antibiotics should be taken with any procedure including but not limited to dental work or colonoscopies. We plan to follow them in another year at that time with 2 view xrays of the left knee and as always we should be notified with any questions or concerns in the interim.  She is scheduled to undergo back surgery with Dr. Louanne Skye.  Follow-Up Instructions: Return in about 1 year (around 03/19/2022).   Orders:  Orders Placed This Encounter  Procedures   XR Knee 1-2 Views Left   No orders of the defined types were placed in this encounter.     Procedures: No procedures performed   Clinical Data: No additional findings.   Subjective: Chief Complaint  Patient presents with   Left Knee - Pain    HPI  Review of Systems   Objective: Vital Signs: There were no vitals taken for this visit.  Physical Exam  Ortho Exam  Specialty Comments:  No specialty comments available.  Imaging: XR Knee 1-2 Views Left  Result Date: 03/19/2021 Stable total knee replacement in good alignment.     PMFS History: Patient Active Problem List   Diagnosis Date Noted   Pre-diabetes    Status post total left knee replacement 03/27/2020   Essential hypertension 12/19/2019   Mixed dyslipidemia 12/19/2019   Diabetes mellitus due to  underlying condition with unspecified complications ([place]) 58/52/7782   Preoperative cardiovascular examination 12/19/2019   Status post AAA (abdominal aortic aneurysm) repair 12/19/2019   Syncope and collapse    Hypertension    High cholesterol    Glaucoma, both eyes    GERD (gastroesophageal reflux disease)    Fibromyalgia    Depression    Bulging lumbar disc    Borderline diabetes    Arthritis    AAA (abdominal aortic aneurysm)    Primary osteoarthritis of left knee 07/18/2019   Disequilibrium    Syncope 03/21/2017   Dizziness    Fall    Hemoptysis 03/18/2015   Solitary pulmonary nodule 03/18/2015   Nuclear cataract of both eyes 01/01/2014   Past Medical History:  Diagnosis Date   AAA (abdominal aortic aneurysm)    Arthritis    "knees" (03/21/2017)   Borderline diabetes    Bulging lumbar disc    Depression    Diabetes mellitus due to underlying condition with unspecified complications ([facility]) 07/12/3534   Disequilibrium    Dizziness    Essential hypertension 12/19/2019   Fall    Fibromyalgia    GERD (gastroesophageal reflux disease)    Glaucoma, both eyes    Hemoptysis 03/18/2015   High cholesterol    "don't have it but I take RX" (  03/21/2017)   Hypertension    Mixed dyslipidemia 12/19/2019   Nuclear cataract of both eyes 01/01/2014   Pre-diabetes    Preoperative cardiovascular examination 12/19/2019   Primary osteoarthritis of left knee 07/18/2019   Solitary pulmonary nodule 03/18/2015   03/2015 86mm RLL nodule    Status post AAA (abdominal aortic aneurysm) repair 12/19/2019   Status post total left knee replacement 03/27/2020   Syncope 03/21/2017   Syncope and collapse     Family History  Problem Relation Age of Onset   Hypertension Father    Stomach cancer Father    Glaucoma Mother    Diabetes Mother    Diabetes Sister    Diabetes Brother    Lupus Child    Diabetes Brother    Lung cancer Brother    Colon cancer Brother     Past Surgical History:  Procedure  Laterality Date   ABDOMINAL AORTIC ANEURYSM REPAIR  1966   APPENDECTOMY  1966   COLONOSCOPY     TOTAL KNEE ARTHROPLASTY Left 03/27/2020   Procedure: LEFT TOTAL KNEE ARTHROPLASTY;  Surgeon: Leandrew Koyanagi, MD;  Location: [facility];  Service: Orthopedics;  Laterality: Left;   VAGINAL HYSTERECTOMY     "partial"   VIDEO BRONCHOSCOPY Bilateral 03/19/2015   Procedure: VIDEO BRONCHOSCOPY WITHOUT FLUORO;  Surgeon: Rigoberto Noel, MD;  Location: [facility];  Service: Cardiopulmonary;  Laterality: Bilateral;   Social History   Occupational History   Occupation: retired    Comment: Charity fundraiser; dietician  Tobacco Use   Smoking status: Never   Smokeless tobacco: Never  Vaping Use   Vaping Use: Never used  Substance and Sexual Activity   Alcohol use: No   Drug use: No   Sexual activity: Not on file

## 2021-03-23 DIAGNOSIS — Z6832 Body mass index (BMI) 32.0-32.9, adult: Secondary | ICD-10-CM | POA: Diagnosis not present

## 2021-03-23 DIAGNOSIS — M5431 Sciatica, right side: Secondary | ICD-10-CM | POA: Diagnosis not present

## 2021-03-23 DIAGNOSIS — I10 Essential (primary) hypertension: Secondary | ICD-10-CM | POA: Diagnosis not present

## 2021-03-23 DIAGNOSIS — M5136 Other intervertebral disc degeneration, lumbar region: Secondary | ICD-10-CM | POA: Diagnosis not present

## 2021-03-23 DIAGNOSIS — R69 Illness, unspecified: Secondary | ICD-10-CM | POA: Diagnosis not present

## 2021-03-23 DIAGNOSIS — E785 Hyperlipidemia, unspecified: Secondary | ICD-10-CM | POA: Diagnosis not present

## 2021-03-23 DIAGNOSIS — Z01810 Encounter for preprocedural cardiovascular examination: Secondary | ICD-10-CM | POA: Diagnosis not present

## 2021-04-21 ENCOUNTER — Ambulatory Visit: Payer: PPO | Admitting: Surgery

## 2021-04-21 ENCOUNTER — Other Ambulatory Visit: Payer: Self-pay

## 2021-04-21 ENCOUNTER — Encounter: Payer: Self-pay | Admitting: Surgery

## 2021-04-21 ENCOUNTER — Other Ambulatory Visit (HOSPITAL_COMMUNITY): Payer: PPO

## 2021-04-21 VITALS — BP 140/82 | HR 85 | Temp 98.5°F

## 2021-04-21 DIAGNOSIS — M48062 Spinal stenosis, lumbar region with neurogenic claudication: Secondary | ICD-10-CM

## 2021-04-21 NOTE — Progress Notes (Addendum)
81 year old white female with history of L2-3 and L3-4 stenosis low back pain and bilateral lower extremity radiculopathy/neurogenic medication comes in for preop evaluation.  States that symptoms unchanged from previous visit and she is wanting to proceed with central laminectomies L2-3 and L3-4 with bilateral foraminotomies as scheduled.  Today history and physical performed.  Review of systems negative.  We received preop medical clearance.  Surgical procedure discussed along with potential rehab/recovery time.  All questions answered.

## 2021-04-21 NOTE — Pre-Procedure Instructions (Signed)
Surgical Instructions    Your procedure is scheduled on Monday, January 16th.  Report to  [facility] Main Entrance "A" at 05:30 A.M., then check in with the Admitting office.  Call this number if you have problems the morning of surgery:  [phone]   If you have any questions prior to your surgery date call [phone]: Open Monday-Friday 8am-4pm    Remember:  Do not eat after midnight the night before your surgery  You may drink clear liquids until 04:30 AM the morning of your surgery.   Clear liquids allowed are: Water, Non-Citrus Juices (without pulp), Carbonated Beverages, Clear Tea, Black Coffee Only, and Gatorade  Patient Instructions  The night before surgery:  No food after midnight. ONLY clear liquids after midnight  The day of surgery (if you do NOT have diabetes):  Drink ONE (1) Pre-Surgery Clear Ensure by 04:30 AM the morning of surgery. Drink in one sitting. Do not sip.  This drink was given to you during your hospital  pre-op appointment visit.  Nothing else to drink after completing the  Pre-Surgery Clear Ensure.         If you have questions, please contact your surgeons office.     Take these medicines the morning of surgery with A SIP OF WATER  amLODipine (NORVASC) dorzolamide-timolol (COSOPT) 22.3-6.8 MG/ML ophthalmic solution (eye drops) fluticasone (FLONASE)  omeprazole (PRILOSEC) venlafaxine XR (EFFEXOR-XR)   If needed: acetaminophen (TYLENOL)   As of today, STOP taking any Aspirin (unless otherwise instructed by your surgeon) Aleve, Naproxen, Ibuprofen, Motrin, Advil, Goody's, BC's, all herbal medications, fish oil, and all vitamins.                     Do NOT Smoke (Tobacco/Vaping) or drink Alcohol 24 hours prior to your procedure.  If you use a CPAP at night, you may bring all equipment for your overnight stay.   Contacts, glasses, piercing's, hearing aid's, dentures or partials may not be worn into surgery, please bring cases for these  belongings.    For patients admitted to the hospital, discharge time will be determined by your treatment team.   Patients discharged the day of surgery will not be allowed to drive home, and someone needs to stay with them for 24 hours.  NO VISITORS WILL BE ALLOWED IN PRE-OP WHERE PATIENTS GET READY FOR SURGERY.  ONLY 1 SUPPORT PERSON MAY BE PRESENT IN THE WAITING ROOM WHILE YOU ARE IN SURGERY.  IF YOU ARE TO BE ADMITTED, ONCE YOU ARE IN YOUR ROOM YOU WILL BE ALLOWED TWO (2) VISITORS.  Minor children may have two parents present. Special consideration for safety and communication needs will be reviewed on a case by case basis.   Special instructions:   [place]- Preparing For Surgery  Before surgery, you can play an important role. Because skin is not sterile, your skin needs to be as free of germs as possible. You can reduce the number of germs on your skin by washing with CHG (chlorahexidine gluconate) Soap before surgery.  CHG is an antiseptic cleaner which kills germs and bonds with the skin to continue killing germs even after washing.    Oral Hygiene is also important to reduce your risk of infection.  Remember - BRUSH YOUR TEETH THE MORNING OF SURGERY WITH YOUR REGULAR TOOTHPASTE  Please do not use if you have an allergy to CHG or antibacterial soaps. If your skin becomes reddened/irritated stop using the CHG.  Do not shave (including legs  and underarms) for at least 48 hours prior to first CHG shower. It is OK to shave your face.  Please follow these instructions carefully.   Shower the NIGHT BEFORE SURGERY and the MORNING OF SURGERY  If you chose to wash your hair, wash your hair first as usual with your normal shampoo.  After you shampoo, rinse your hair and body thoroughly to remove the shampoo.  Use CHG Soap as you would any other liquid soap. You can apply CHG directly to the skin and wash gently with a scrungie or a clean washcloth.   Apply the CHG Soap to your body  ONLY FROM THE NECK DOWN.  Do not use on open wounds or open sores. Avoid contact with your eyes, ears, mouth and genitals (private parts). Wash Face and genitals (private parts)  with your normal soap.   Wash thoroughly, paying special attention to the area where your surgery will be performed.  Thoroughly rinse your body with warm water from the neck down.  DO NOT shower/wash with your normal soap after using and rinsing off the CHG Soap.  Pat yourself dry with a CLEAN TOWEL.  Wear CLEAN PAJAMAS to bed the night before surgery  Place CLEAN SHEETS on your bed the night before your surgery  DO NOT SLEEP WITH PETS.   Day of Surgery: Shower with CHG soap. Do not wear jewelry, make up, nail polish, gel polish, artificial nails, or any other type of covering on natural nails including finger and toenails. If patients have artificial nails, gel coating, etc. that need to be removed by a nail salon please have this removed prior to surgery. Surgery may need to be canceled/delayed if the surgeon/ anesthesia feels like the patient is unable to be adequately monitored. Do not wear lotions, powders, perfumes, or deodorant. Do not shave 48 hours prior to surgery.   Do not bring valuables to the hospital. [facility] is not responsible for any belongings or valuables. Wear Clean/Comfortable clothing the morning of surgery Remember to brush your teeth WITH YOUR REGULAR TOOTHPASTE.   Please read over the following fact sheets that you were given.   3 days prior to your procedure or After your COVID test   You are not required to quarantine however you are required to wear a well-fitting mask when you are out and around people not in your household. If your mask becomes wet or soiled, replace with a new one.   Wash your hands often with soap and water for 20 seconds or clean your hands with an alcohol-based hand sanitizer that contains at least 60% alcohol.   Do not share personal items.    Notify your provider:  o if you are in close contact with someone who has COVID  o or if you develop a fever of 100.4 or greater, sneezing, cough, sore throat, shortness of breath or body aches.

## 2021-04-22 ENCOUNTER — Encounter (HOSPITAL_COMMUNITY): Payer: Self-pay

## 2021-04-22 ENCOUNTER — Encounter (HOSPITAL_COMMUNITY)
Admission: RE | Admit: 2021-04-22 | Discharge: 2021-04-22 | Disposition: A | Discharge status: Home / Self Care | Payer: PPO | Source: Ambulatory Visit | Attending: Surgery | Admitting: Surgery

## 2021-04-22 ENCOUNTER — Encounter (HOSPITAL_COMMUNITY)
Admission: RE | Admit: 2021-04-22 | Discharge: 2021-04-22 | Disposition: A | Discharge status: Home / Self Care | Payer: PPO | Source: Ambulatory Visit | Attending: Specialist | Admitting: Specialist

## 2021-04-22 DIAGNOSIS — E785 Hyperlipidemia, unspecified: Secondary | ICD-10-CM | POA: Insufficient documentation

## 2021-04-22 DIAGNOSIS — I2510 Atherosclerotic heart disease of native coronary artery without angina pectoris: Secondary | ICD-10-CM | POA: Insufficient documentation

## 2021-04-22 DIAGNOSIS — I714 Abdominal aortic aneurysm, without rupture, unspecified: Secondary | ICD-10-CM | POA: Insufficient documentation

## 2021-04-22 DIAGNOSIS — Z01818 Encounter for other preprocedural examination: Secondary | ICD-10-CM | POA: Diagnosis not present

## 2021-04-22 DIAGNOSIS — J9811 Atelectasis: Secondary | ICD-10-CM | POA: Diagnosis not present

## 2021-04-22 DIAGNOSIS — I10 Essential (primary) hypertension: Secondary | ICD-10-CM | POA: Insufficient documentation

## 2021-04-22 DIAGNOSIS — Z20822 Contact with and (suspected) exposure to covid-19: Secondary | ICD-10-CM | POA: Diagnosis not present

## 2021-04-22 LAB — COMPREHENSIVE METABOLIC PANEL
ALT: 20 U/L (ref 0–44)
AST: 24 U/L (ref 15–41)
Albumin: 3.6 g/dL (ref 3.5–5.0)
Alkaline Phosphatase: 66 U/L (ref 38–126)
Anion gap: 9 (ref 5–15)
BUN: 26 mg/dL — ABNORMAL HIGH (ref 8–23)
CO2: 27 mmol/L (ref 22–32)
Calcium: 9.4 mg/dL (ref 8.9–10.3)
Chloride: 104 mmol/L (ref 98–111)
Creatinine, Ser: 0.88 mg/dL (ref 0.44–1.00)
GFR, Estimated: >60 mL/min (ref >60)
Glucose, Bld: 114 mg/dL — ABNORMAL HIGH (ref 70–99)
Potassium: 4.2 mmol/L (ref 3.5–5.1)
Sodium: 140 mmol/L (ref 135–145)
Total Bilirubin: 1.1 mg/dL (ref 0.3–1.2)
Total Protein: 7.2 g/dL (ref 6.5–8.1)

## 2021-04-22 LAB — SURGICAL PCR SCREEN
MRSA, PCR: NEGATIVE
Staphylococcus aureus: NEGATIVE

## 2021-04-22 LAB — CBC
HCT: 44.5 % (ref 36.0–46.0)
Hemoglobin: 14.7 g/dL (ref 12.0–15.0)
MCH: 34.6 pg — ABNORMAL HIGH (ref 26.0–34.0)
MCHC: 33 g/dL (ref 30.0–36.0)
MCV: 104.7 fL — ABNORMAL HIGH (ref 80.0–100.0)
Platelets: 230 10*3/uL (ref 150–400)
RBC: 4.25 MIL/uL (ref 3.87–5.11)
RDW: 13.2 % (ref 11.5–15.5)
WBC: 5 10*3/uL (ref 4.0–10.5)
nRBC: 0 % (ref 0.0–0.2)

## 2021-04-22 LAB — URINALYSIS, ROUTINE W REFLEX MICROSCOPIC
Bilirubin Urine: NEGATIVE
Glucose, UA: NEGATIVE mg/dL
Hgb urine dipstick: NEGATIVE
Ketones, ur: NEGATIVE mg/dL
Leukocytes,Ua: NEGATIVE
Nitrite: NEGATIVE
Protein, ur: NEGATIVE mg/dL
Specific Gravity, Urine: >1.03 — ABNORMAL HIGH (ref 1.005–1.030)
pH: 5 (ref 5.0–8.0)

## 2021-04-22 LAB — SARS CORONAVIRUS 2 (TAT 6-24 HRS): SARS Coronavirus 2: NEGATIVE

## 2021-04-22 NOTE — Progress Notes (Signed)
PCP - [name], NP Cardiologist - Dr. Jyl Heinz- pt saw Dr. Geraldo Pitter in March. She said she does not routinely see Cardiology, but his last note from 06/2020 says for her to f/u in 6 mos. Pt states she cancelled that appt because she felt like it wasn't needed.  PPM/ICD - denies   Chest x-ray - 04/22/21 at PAT EKG - 04/22/21 at PAT Stress Test - 01/02/20 ECHO - 03/22/17 Cardiac Cath - pt states she had one well over 20 years ago with no abnormalities.   Sleep Study - denies  DM- denies  Blood Thinner Instructions: n/a Aspirin Instructions: pt instructed to contact surgeon for instructions on ASA  ERAS Protcol - yes PRE-SURGERY Ensure given at PAT  COVID TEST- 04/22/21 at PAT   Anesthesia review: yes, cardiac hx  Patient denies shortness of breath, fever, cough and chest pain at PAT appointment   All instructions explained to the patient, with a verbal understanding of the material. Patient agrees to go over the instructions while at home for a better understanding. Patient also instructed to wear a mask in public after being tested for COVID-19. The opportunity to ask questions was provided.

## 2021-04-23 ENCOUNTER — Other Ambulatory Visit: Payer: Self-pay

## 2021-04-23 NOTE — Anesthesia Preprocedure Evaluation (Addendum)
Anesthesia Evaluation  Patient identified by MRN, date of birth, ID band Patient awake    Reviewed: Allergy & Precautions, NPO status , Patient's Chart, lab work & pertinent test results  Airway Mallampati: II  TM Distance: >3 FB     Dental  (+) Dental Advisory Given   Pulmonary neg pulmonary ROS,    breath sounds clear to auscultation       Cardiovascular hypertension, Pt. on medications  Rhythm:Regular Rate:Normal     Neuro/Psych  Neuromuscular disease    GI/Hepatic Neg liver ROS, GERD  ,  Endo/Other  diabetes  Renal/GU negative Renal ROS     Musculoskeletal  (+) Arthritis ,   Abdominal   Peds  Hematology negative hematology ROS (+)   Anesthesia Other Findings   Reproductive/Obstetrics                            Lab Results  Component Value Date   WBC 5.0 04/22/2021   HGB 14.7 04/22/2021   HCT 44.5 04/22/2021   MCV 104.7 (H) 04/22/2021   PLT 230 04/22/2021   Lab Results  Component Value Date   CREATININE 0.88 04/22/2021   BUN 26 (H) 04/22/2021   NA 140 04/22/2021   K 4.2 04/22/2021   CL 104 04/22/2021   CO2 27 04/22/2021    Anesthesia Physical Anesthesia Plan  ASA: 3  Anesthesia Plan: General   Post-op Pain Management: Tylenol PO (pre-op) and Minimal or no pain anticipated   Induction: Intravenous  PONV Risk Score and Plan: 3 and Dexamethasone, Ondansetron and Treatment may vary due to age or medical condition  Airway Management Planned: Oral ETT  Additional Equipment: None  Intra-op Plan:   Post-operative Plan: Extubation in OR  Informed Consent: I have reviewed the patients History and Physical, chart, labs and discussed the procedure including the risks, benefits and alternatives for the proposed anesthesia with the patient or authorized representative who has indicated his/her understanding and acceptance.     Dental advisory given  Plan Discussed  with: CRNA  Anesthesia Plan Comments: (PAT note by Karoline Caldwell, PA-C:  Patient has no history of cardiovascular disease, she has been seen by Dr. Geraldo Pitter for HTN, HLD, risk reduction; most recently 06/17/20.  She has a remote history of AAA repair in 1966.  Nuclear stress September 2021 was nonischemic, low risk.  Echo December 2018 showed EF 60 to 65%, normal wall motion, grade 1 DD, mild MR, mild TR.  Preop labs reviewed, unremarkable.  EKG 04/22/2021: NSR.  Rate 62.  Nonspecific T wave abnormality.  CHEST - 2 VIEW 04/22/2021: COMPARISON: Multiple chest XRs, most recently 01/28/2020. CT chest, 10/13/2017.  FINDINGS: Cardiomediastinal silhouette is within normal limits. Lungs are well inflated. Middle lobe and RIGHT perihilar streaky opacities. No focal consolidation or mass. No pleural effusion or pneumothorax. No acute displaced fracture.  IMPRESSION: Mild RIGHT lower lobe atelectasis without acute superimposed cardiopulmonary process.  Vascular ultrasound AAA duplex 01/10/2020: Summary:  Abdominal Aorta: No evidence of an abdominal aortic aneurysm. The largest  aortic measurement is 2.6 cm. No previous exam available for comparison.  IVC/Iliac: No evidence of thrombus in IVC   Nuclear stress 01/02/2020:  Nuclear stress EF: 84%.  The left ventricular ejection fraction is hyperdynamic (>65%).  There was no ST segment deviation noted during stress.  No T wave inversion was noted during stress.  The study is normal.  This is a low risk study.  TTE 03/22/2017: -  Left ventricle: The cavity size was normal. Wall thickness was  increased in a pattern of moderate LVH. Systolic function was  normal. The estimated ejection fraction was in the range of 60%  to 65%. Wall motion was normal; there were no regional wall  motion abnormalities. Doppler parameters are consistent with  abnormal left ventricular relaxation (grade 1 diastolic  dysfunction). The E/e&' ratio  is between 8-15, suggesting  indeterminate LV filling pressure.  - Mitral valve: Mildly thickened leaflets . There was mild  regurgitation.  - Left atrium: The atrium was normal in size.  - Tricuspid valve: There was mild regurgitation.  - Pulmonary arteries: PA peak pressure: 42 mm Hg (S).  - Inferior vena cava: The vessel was normal in size. The  respirophasic diameter changes were in the normal range (>= 50%),  consistent with normal central venous pressure.   Impressions:   - LVEF 60-65%, moderate LVH, normal wall motion, grade 1 DD,  indeterminate LV filling pressure, mild MR, normal LA size, mild  TR, RVSP 42 mmHg, normal IVC.    )       Anesthesia Quick Evaluation

## 2021-04-23 NOTE — H&P (Signed)
Cassandra Brooks is an 81 y.o. adult.   Chief Complaint: Back pain and lower extremity radiculopathy  HPI: 81 year old white female with history of L2-3 and L3-4 stenosis low back pain and bilateral lower extremity radiculopathy/neurogenic medication comes in for preop evaluation.  States that symptoms unchanged from previous visit and she is wanting to proceed with central laminectomies L2-3 and L3-4 with bilateral foraminotomies as scheduled.  Today history and physical performed.  Review of systems negative.  We received preop medical clearance.  Past Medical History:  Diagnosis Date   AAA (abdominal aortic aneurysm)    Arthritis    "knees" (03/21/2017)   Borderline diabetes    Bulging lumbar disc    Depression    Disequilibrium    Dizziness    Essential hypertension 12/19/2019   Fall    Fibromyalgia    GERD (gastroesophageal reflux disease)    Glaucoma, both eyes    Hemoptysis 03/18/2015   High cholesterol    "don't have it but I take RX" (03/21/2017)   Hypertension    Mixed dyslipidemia 12/19/2019   Nuclear cataract of both eyes 01/01/2014   Pre-diabetes    pt states she is not diabetic. Last A1C was normal. Pt is on no medications for DM   Preoperative cardiovascular examination 12/19/2019   Primary osteoarthritis of left knee 07/18/2019   Solitary pulmonary nodule 03/18/2015   03/2015 84mm RLL nodule    Status post AAA (abdominal aortic aneurysm) repair 12/19/2019   Status post total left knee replacement 03/27/2020   Syncope 03/21/2017   Syncope and collapse     Past Surgical History:  Procedure Laterality Date   [place]     20+ years ago, no abnormalities found   COLONOSCOPY     TOTAL KNEE ARTHROPLASTY Left 03/27/2020   Procedure: LEFT TOTAL KNEE ARTHROPLASTY;  Surgeon: Leandrew Koyanagi, MD;  Location: [facility];  Service: Orthopedics;  Laterality: Left;   VAGINAL HYSTERECTOMY     "partial"    VIDEO BRONCHOSCOPY Bilateral 03/19/2015   Procedure: VIDEO BRONCHOSCOPY WITHOUT FLUORO;  Surgeon: Rigoberto Noel, MD;  Location: [facility];  Service: Cardiopulmonary;  Laterality: Bilateral;    Family History  Problem Relation Age of Onset   Hypertension Father    Stomach cancer Father    Glaucoma Mother    Diabetes Mother    Diabetes Sister    Diabetes Brother    Lupus Child    Diabetes Brother    Lung cancer Brother    Colon cancer Brother    Social History:  reports that she has never smoked. She has never used smokeless tobacco. She reports that she does not drink alcohol and does not use drugs.  Allergies: No Known Allergies  No medications prior to admission.    Results for orders placed or performed during the hospital encounter of 04/22/21 (from the past 48 hour(s))  SARS CORONAVIRUS 2 (TAT 6-24 HRS) Nasopharyngeal Nasopharyngeal Swab     Status: None   Collection Time: 04/22/21  8:52 AM   Specimen: Nasopharyngeal Swab  Result Value Ref Range   SARS Coronavirus 2 NEGATIVE NEGATIVE    Comment: (NOTE) SARS-CoV-2 target nucleic acids are NOT DETECTED.  The SARS-CoV-2 RNA is generally detectable in upper and lower respiratory specimens during the acute phase of infection. Negative results do not preclude SARS-CoV-2 infection, do not rule out co-infections with other pathogens, and should not be used as  the sole basis for treatment or other patient management decisions. Negative results must be combined with clinical observations, patient history, and epidemiological information. The expected result is Negative.  Fact Sheet for Patients: SugarRoll.be  Fact Sheet for Healthcare Providers: https://www.woods-mathews.com/  This test is not yet approved or cleared by the [place] FDA and  has been authorized for detection and/or diagnosis of SARS-CoV-2 by FDA under an Emergency Use Authorization (EUA). This EUA will  remain  in effect (meaning this test can be used) for the duration of the COVID-19 declaration under Se ction 564(b)(1) of the Act, 21 U.S.C. section 360bbb-3(b)(1), unless the authorization is terminated or revoked sooner.  Performed at [facility], [place] [address] [place], [place] [address]   Surgical pcr screen     Status: None   Collection Time: 04/22/21  8:53 AM   Specimen: Nasal Mucosa; Nasal Swab  Result Value Ref Range   MRSA, PCR NEGATIVE NEGATIVE   Staphylococcus aureus NEGATIVE NEGATIVE    Comment: (NOTE) The Xpert SA Assay (FDA approved for NASAL specimens in patients 61 years of age and older), is one component of a comprehensive surveillance program. It is not intended to diagnose infection nor to guide or monitor treatment. Performed at [facility], [place] [address], [place], [place] [address]   CBC     Status: Abnormal   Collection Time: 04/22/21  8:53 AM  Result Value Ref Range   WBC 5.0 4.0 - 10.5 K/uL   RBC 4.25 3.87 - 5.11 MIL/uL   Hemoglobin 14.7 12.0 - 15.0 g/dL   HCT 44.5 36.0 - 46.0 %   MCV 104.7 (H) 80.0 - 100.0 fL   MCH 34.6 (H) 26.0 - 34.0 pg   MCHC 33.0 30.0 - 36.0 g/dL   RDW 13.2 11.5 - 15.5 %   Platelets 230 150 - 400 K/uL   nRBC 0.0 0.0 - 0.2 %    Comment: Performed at [facility], [place] [address] [place], [place] [address]  Comprehensive metabolic panel     Status: Abnormal   Collection Time: 04/22/21  8:53 AM  Result Value Ref Range   Sodium 140 135 - 145 mmol/L   Potassium 4.2 3.5 - 5.1 mmol/L   Chloride 104 98 - 111 mmol/L   CO2 27 22 - 32 mmol/L   Glucose, Bld 114 (H) 70 - 99 mg/dL    Comment: Glucose reference range applies only to samples taken after fasting for at least 8 hours.   BUN 26 (H) 8 - 23 mg/dL   Creatinine, Ser 0.88 0.44 - 1.00 mg/dL   Calcium 9.4 8.9 - 10.3 mg/dL   Total Protein 7.2 6.5 - 8.1 g/dL   Albumin 3.6 3.5 - 5.0 g/dL   AST 24 15 - 41 U/L   ALT 20 0 - 44 U/L   Alkaline Phosphatase 66 38  - 126 U/L   Total Bilirubin 1.1 0.3 - 1.2 mg/dL   GFR, Estimated >60 >60 mL/min    Comment: (NOTE) Calculated using the CKD-EPI Creatinine Equation (2021)    Anion gap 9 5 - 15    Comment: Performed at [facility] [address], [place], [place] [address]  Urinalysis, Routine w reflex microscopic     Status: Abnormal   Collection Time: 04/22/21  8:53 AM  Result Value Ref Range   Color, Urine YELLOW YELLOW   APPearance CLEAR CLEAR   Specific Gravity, Urine >1.030 (H) 1.005 -  1.030   pH 5.0 5.0 - 8.0   Glucose, UA NEGATIVE NEGATIVE mg/dL   Hgb urine dipstick NEGATIVE NEGATIVE   Bilirubin Urine NEGATIVE NEGATIVE   Ketones, ur NEGATIVE NEGATIVE mg/dL   Protein, ur NEGATIVE NEGATIVE mg/dL   Nitrite NEGATIVE NEGATIVE   Leukocytes,Ua NEGATIVE NEGATIVE    Comment: Microscopic not done on urines with negative protein, blood, leukocytes, nitrite, or glucose < 500 mg/dL. Performed at [facility], [place] [address], [place], [place] [address]    DG Chest 2 View  Result Date: 04/22/2021 CLINICAL DATA:  Preoperative evaluation. EXAM: CHEST - 2 VIEW COMPARISON:  Multiple chest XRs, most recently 01/28/2020. CT chest, 10/13/2017. FINDINGS: Cardiomediastinal silhouette is within normal limits. Lungs are well inflated. Middle lobe and RIGHT perihilar streaky opacities. No focal consolidation or mass. No pleural effusion or pneumothorax. No acute displaced fracture. IMPRESSION: Mild RIGHT lower lobe atelectasis without acute superimposed cardiopulmonary process. Electronically Signed   By: Michaelle Birks M.D.   On: 04/22/2021 09:20    Review of Systems  Constitutional:  Positive for activity change.  HENT: Negative.    Respiratory: Negative.    Cardiovascular: Negative.   Gastrointestinal: Negative.   Genitourinary: Negative.   Neurological:  Positive for numbness.  Psychiatric/Behavioral: Negative.     There were no vitals taken for this visit. Physical Exam HENT:     Head:  Normocephalic.     Nose: Nose normal.  Eyes:     Extraocular Movements: Extraocular movements intact.  Cardiovascular:     Rate and Rhythm: Regular rhythm.     Heart sounds: No murmur heard. Pulmonary:     Effort: Pulmonary effort is normal. No respiratory distress.     Breath sounds: Normal breath sounds.  Abdominal:     General: Bowel sounds are normal.     Tenderness: There is no abdominal tenderness.  Musculoskeletal:        General: Tenderness present.  Neurological:     Mental Status: She is alert and oriented to person, place, and time.  Psychiatric:        Mood and Affect: Mood normal.     Assessment/Plan L2-3 and L3-4 stenosis   We will proceed with central laminectomies L2-3 and L3-4 with bilateral foraminotomies as scheduled.  All questions answered.  Benjiman Core, PA-C 04/23/2021, 3:12 PM

## 2021-04-23 NOTE — Progress Notes (Signed)
Anesthesia Chart Review:  Patient has no history of cardiovascular disease, she has been seen by Dr. Geraldo Pitter for HTN, HLD, risk reduction; most recently 06/17/20.  She has a remote history of AAA repair in 1966.  Nuclear stress September 2021 was nonischemic, low risk.  Echo December 2018 showed EF 60 to 65%, normal wall motion, grade 1 DD, mild MR, mild TR.  Preop labs reviewed, unremarkable.  EKG 04/22/2021: NSR.  Rate 62.  Nonspecific T wave abnormality.  CHEST - 2 VIEW 04/22/2021: COMPARISON:  Multiple chest XRs, most recently 01/28/2020. CT chest, 10/13/2017.   FINDINGS: Cardiomediastinal silhouette is within normal limits. Lungs are well inflated. Middle lobe and RIGHT perihilar streaky opacities. No focal consolidation or mass. No pleural effusion or pneumothorax. No acute displaced fracture.   IMPRESSION: Mild RIGHT lower lobe atelectasis without acute superimposed cardiopulmonary process.  Vascular ultrasound AAA duplex 01/10/2020: Summary:  Abdominal Aorta: No evidence of an abdominal aortic aneurysm. The largest  aortic measurement is 2.6 cm. No previous exam available for comparison.  IVC/Iliac: No evidence of thrombus in IVC   Nuclear stress 01/02/2020: Nuclear stress EF: 84%. The left ventricular ejection fraction is hyperdynamic (>65%). There was no ST segment deviation noted during stress. No T wave inversion was noted during stress. The study is normal. This is a low risk study.  TTE 03/22/2017: - Left ventricle: The cavity size was normal. Wall thickness was    increased in a pattern of moderate LVH. Systolic function was    normal. The estimated ejection fraction was in the range of 60%    to 65%. Wall motion was normal; there were no regional wall    motion abnormalities. Doppler parameters are consistent with    abnormal left ventricular relaxation (grade 1 diastolic    dysfunction). The E/e&' ratio is between 8-15, suggesting    indeterminate LV filling  pressure.  - Mitral valve: Mildly thickened leaflets . There was mild    regurgitation.  - Left atrium: The atrium was normal in size.  - Tricuspid valve: There was mild regurgitation.  - Pulmonary arteries: PA peak pressure: 42 mm Hg (S).  - Inferior vena cava: The vessel was normal in size. The    respirophasic diameter changes were in the normal range (>= 50%),    consistent with normal central venous pressure.   Impressions:   - LVEF 60-65%, moderate LVH, normal wall motion, grade 1 DD,    indeterminate LV filling pressure, mild MR, normal LA size, mild    TR, RVSP 42 mmHg, normal IVC.    Cassandra Brooks [facility] Stay [facility]/Anesthesiology Phone [phone] 04/23/2021 10:21 AM

## 2021-04-26 ENCOUNTER — Ambulatory Visit (HOSPITAL_COMMUNITY): Payer: PPO

## 2021-04-26 ENCOUNTER — Encounter (HOSPITAL_COMMUNITY): Payer: Self-pay | Admitting: Specialist

## 2021-04-26 ENCOUNTER — Ambulatory Visit (HOSPITAL_COMMUNITY): Payer: PPO | Admitting: Physician Assistant

## 2021-04-26 ENCOUNTER — Ambulatory Visit (HOSPITAL_COMMUNITY): Payer: PPO | Admitting: Anesthesiology

## 2021-04-26 ENCOUNTER — Encounter (HOSPITAL_COMMUNITY)
Admission: RE | Disposition: A | Discharge status: Home / Self Care | Payer: Self-pay | Source: Home / Self Care | Attending: Specialist

## 2021-04-26 ENCOUNTER — Observation Stay (HOSPITAL_COMMUNITY)
Admission: RE | Admit: 2021-04-26 | Discharge: 2021-04-27 | Disposition: A | Discharge status: Home / Self Care | Payer: PPO | Attending: Specialist | Admitting: Specialist

## 2021-04-26 DIAGNOSIS — M1990 Unspecified osteoarthritis, unspecified site: Secondary | ICD-10-CM | POA: Diagnosis not present

## 2021-04-26 DIAGNOSIS — Z96652 Presence of left artificial knee joint: Secondary | ICD-10-CM | POA: Diagnosis not present

## 2021-04-26 DIAGNOSIS — Z419 Encounter for procedure for purposes other than remedying health state, unspecified: Secondary | ICD-10-CM

## 2021-04-26 DIAGNOSIS — I10 Essential (primary) hypertension: Secondary | ICD-10-CM | POA: Diagnosis not present

## 2021-04-26 DIAGNOSIS — R7303 Prediabetes: Secondary | ICD-10-CM | POA: Diagnosis not present

## 2021-04-26 DIAGNOSIS — M5416 Radiculopathy, lumbar region: Secondary | ICD-10-CM | POA: Diagnosis not present

## 2021-04-26 DIAGNOSIS — Z01818 Encounter for other preprocedural examination: Secondary | ICD-10-CM

## 2021-04-26 DIAGNOSIS — Z981 Arthrodesis status: Secondary | ICD-10-CM | POA: Diagnosis not present

## 2021-04-26 DIAGNOSIS — R42 Dizziness and giddiness: Secondary | ICD-10-CM

## 2021-04-26 DIAGNOSIS — M48062 Spinal stenosis, lumbar region with neurogenic claudication: Secondary | ICD-10-CM | POA: Diagnosis present

## 2021-04-26 DIAGNOSIS — M48061 Spinal stenosis, lumbar region without neurogenic claudication: Principal | ICD-10-CM | POA: Insufficient documentation

## 2021-04-26 DIAGNOSIS — E119 Type 2 diabetes mellitus without complications: Secondary | ICD-10-CM | POA: Diagnosis not present

## 2021-04-26 HISTORY — PX: LUMBAR LAMINECTOMY/DECOMPRESSION MICRODISCECTOMY: SHX5026

## 2021-04-26 HISTORY — DX: Spinal stenosis, lumbar region with neurogenic claudication: M48.062

## 2021-04-26 SURGERY — LUMBAR LAMINECTOMY/DECOMPRESSION MICRODISCECTOMY
Anesthesia: General | Site: Spine Lumbar

## 2021-04-26 MED ORDER — BUPIVACAINE HCL (PF) 0.5 % IJ SOLN
INTRAMUSCULAR | Status: AC
  Filled 2021-04-26: qty 30

## 2021-04-26 MED ORDER — FENTANYL CITRATE (PF) 250 MCG/5ML IJ SOLN
INTRAMUSCULAR | Status: AC
  Filled 2021-04-26: qty 5

## 2021-04-26 MED ORDER — BUPIVACAINE LIPOSOME 1.3 % IJ SUSP
10.0000 mL | Freq: Once | INTRAMUSCULAR | Status: DC
  Filled 2021-04-26: qty 10

## 2021-04-26 MED ORDER — PANTOPRAZOLE SODIUM 40 MG PO TBEC
40.0000 mg | DELAYED_RELEASE_TABLET | Freq: Every day | ORAL | Status: DC
  Administered 2021-04-26 – 2021-04-27 (×2): 40 mg via ORAL
  Filled 2021-04-26 (×2): qty 1

## 2021-04-26 MED ORDER — HYDROCODONE-ACETAMINOPHEN 7.5-325 MG PO TABS
2.0000 | ORAL_TABLET | ORAL | Status: DC | PRN
  Administered 2021-04-27: 2 via ORAL
  Filled 2021-04-26: qty 2

## 2021-04-26 MED ORDER — AMLODIPINE BESYLATE 5 MG PO TABS
10.0000 mg | ORAL_TABLET | Freq: Every day | ORAL | Status: DC
  Administered 2021-04-27: 10 mg via ORAL
  Filled 2021-04-26: qty 2

## 2021-04-26 MED ORDER — CEFAZOLIN SODIUM-DEXTROSE 2-4 GM/100ML-% IV SOLN
2.0000 g | INTRAVENOUS | Status: AC
  Administered 2021-04-26: 2 g via INTRAVENOUS
  Filled 2021-04-26: qty 100

## 2021-04-26 MED ORDER — LACTATED RINGERS IV SOLN: INTRAVENOUS | Status: DC

## 2021-04-26 MED ORDER — METHOCARBAMOL 500 MG PO TABS
500.0000 mg | ORAL_TABLET | Freq: Four times a day (QID) | ORAL | Status: DC | PRN
  Administered 2021-04-26: 500 mg via ORAL
  Filled 2021-04-26: qty 1

## 2021-04-26 MED ORDER — THROMBIN (RECOMBINANT) 20000 UNITS EX SOLR
CUTANEOUS | Status: AC
  Filled 2021-04-26: qty 20000

## 2021-04-26 MED ORDER — CHLORHEXIDINE GLUCONATE 0.12 % MT SOLN
15.0000 mL | Freq: Once | OROMUCOSAL | Status: AC
  Administered 2021-04-26: 15 mL via OROMUCOSAL
  Filled 2021-04-26: qty 15

## 2021-04-26 MED ORDER — DEXAMETHASONE SODIUM PHOSPHATE 10 MG/ML IJ SOLN
INTRAMUSCULAR | Status: AC
  Filled 2021-04-26: qty 1

## 2021-04-26 MED ORDER — ALUM & MAG HYDROXIDE-SIMETH 200-200-20 MG/5ML PO SUSP: 30.0000 mL | Freq: Four times a day (QID) | ORAL | Status: DC | PRN

## 2021-04-26 MED ORDER — PROPOFOL 10 MG/ML IV BOLUS
INTRAVENOUS | Status: DC | PRN
  Administered 2021-04-26: 120 mg via INTRAVENOUS

## 2021-04-26 MED ORDER — LIDOCAINE 2% (20 MG/ML) 5 ML SYRINGE
INTRAMUSCULAR | Status: AC
  Filled 2021-04-26: qty 5

## 2021-04-26 MED ORDER — PHENOL 1.4 % MT LIQD: 1.0000 | OROMUCOSAL | Status: DC | PRN

## 2021-04-26 MED ORDER — CEFAZOLIN SODIUM-DEXTROSE 2-4 GM/100ML-% IV SOLN
2.0000 g | Freq: Three times a day (TID) | INTRAVENOUS | Status: AC
  Administered 2021-04-26 (×2): 2 g via INTRAVENOUS
  Filled 2021-04-26 (×2): qty 100

## 2021-04-26 MED ORDER — PRAVASTATIN SODIUM 10 MG PO TABS
20.0000 mg | ORAL_TABLET | Freq: Every day | ORAL | Status: DC
  Administered 2021-04-26: 20 mg via ORAL
  Filled 2021-04-26: qty 2

## 2021-04-26 MED ORDER — ONDANSETRON HCL 4 MG/2ML IJ SOLN
INTRAMUSCULAR | Status: AC
  Filled 2021-04-26: qty 2

## 2021-04-26 MED ORDER — METHOCARBAMOL 1000 MG/10ML IJ SOLN
500.0000 mg | Freq: Four times a day (QID) | INTRAVENOUS | Status: DC | PRN
  Filled 2021-04-26: qty 5

## 2021-04-26 MED ORDER — FENTANYL CITRATE (PF) 100 MCG/2ML IJ SOLN
25.0000 ug | INTRAMUSCULAR | Status: DC | PRN
  Administered 2021-04-26: 50 ug via INTRAVENOUS

## 2021-04-26 MED ORDER — ONDANSETRON HCL 4 MG/2ML IJ SOLN: 4.0000 mg | Freq: Four times a day (QID) | INTRAMUSCULAR | Status: DC | PRN

## 2021-04-26 MED ORDER — FLEET ENEMA 7-19 GM/118ML RE ENEM: 1.0000 | ENEMA | Freq: Once | RECTAL | Status: DC | PRN

## 2021-04-26 MED ORDER — FENTANYL CITRATE (PF) 100 MCG/2ML IJ SOLN
INTRAMUSCULAR | Status: AC
  Filled 2021-04-26: qty 2

## 2021-04-26 MED ORDER — ORAL CARE MOUTH RINSE: 15.0000 mL | Freq: Once | OROMUCOSAL | Status: AC

## 2021-04-26 MED ORDER — MORPHINE SULFATE (PF) 2 MG/ML IV SOLN: 1.0000 mg | INTRAVENOUS | Status: DC | PRN

## 2021-04-26 MED ORDER — NABUMETONE 500 MG PO TABS: 750.0000 mg | ORAL_TABLET | Freq: Two times a day (BID) | ORAL | Status: DC

## 2021-04-26 MED ORDER — AMISULPRIDE (ANTIEMETIC) 5 MG/2ML IV SOLN: 10.0000 mg | Freq: Once | INTRAVENOUS | Status: DC | PRN

## 2021-04-26 MED ORDER — EPHEDRINE SULFATE-NACL 50-0.9 MG/10ML-% IV SOSY: PREFILLED_SYRINGE | INTRAVENOUS | Status: DC | PRN

## 2021-04-26 MED ORDER — POLYETHYLENE GLYCOL 3350 17 G PO PACK: 17.0000 g | PACK | Freq: Every day | ORAL | Status: DC | PRN

## 2021-04-26 MED ORDER — KETOROLAC TROMETHAMINE 15 MG/ML IJ SOLN
7.5000 mg | Freq: Four times a day (QID) | INTRAMUSCULAR | Status: AC
  Administered 2021-04-26 – 2021-04-27 (×4): 7.5 mg via INTRAVENOUS
  Filled 2021-04-26 (×4): qty 1

## 2021-04-26 MED ORDER — FENTANYL CITRATE (PF) 250 MCG/5ML IJ SOLN
INTRAMUSCULAR | Status: DC | PRN
  Administered 2021-04-26: 50 ug via INTRAVENOUS
  Administered 2021-04-26: 100 ug via INTRAVENOUS

## 2021-04-26 MED ORDER — PROPOFOL 10 MG/ML IV BOLUS
INTRAVENOUS | Status: AC
  Filled 2021-04-26: qty 20

## 2021-04-26 MED ORDER — DORZOLAMIDE HCL-TIMOLOL MAL 2-0.5 % OP SOLN
1.0000 [drp] | Freq: Two times a day (BID) | OPHTHALMIC | Status: DC
  Administered 2021-04-26 – 2021-04-27 (×2): 1 [drp] via OPHTHALMIC
  Filled 2021-04-26: qty 10

## 2021-04-26 MED ORDER — ROCURONIUM BROMIDE 10 MG/ML (PF) SYRINGE
PREFILLED_SYRINGE | INTRAVENOUS | Status: AC
  Filled 2021-04-26: qty 10

## 2021-04-26 MED ORDER — ACETAMINOPHEN 500 MG PO TABS
1000.0000 mg | ORAL_TABLET | Freq: Once | ORAL | Status: AC
  Administered 2021-04-26: 1000 mg via ORAL
  Filled 2021-04-26: qty 2

## 2021-04-26 MED ORDER — FLUTICASONE PROPIONATE 50 MCG/ACT NA SUSP
2.0000 | Freq: Two times a day (BID) | NASAL | Status: DC
  Administered 2021-04-26 – 2021-04-27 (×2): 2 via NASAL
  Filled 2021-04-26: qty 16

## 2021-04-26 MED ORDER — VENLAFAXINE HCL ER 75 MG PO CP24
150.0000 mg | ORAL_CAPSULE | Freq: Every day | ORAL | Status: DC
  Administered 2021-04-27: 150 mg via ORAL
  Filled 2021-04-26: qty 2

## 2021-04-26 MED ORDER — DEXAMETHASONE SODIUM PHOSPHATE 10 MG/ML IJ SOLN
INTRAMUSCULAR | Status: DC | PRN
Start: 2021-04-26 — End: 2021-04-26
  Administered 2021-04-26: 10 mg via INTRAVENOUS

## 2021-04-26 MED ORDER — ASPIRIN EC 81 MG PO TBEC
81.0000 mg | DELAYED_RELEASE_TABLET | Freq: Every day | ORAL | Status: DC
  Administered 2021-04-27: 81 mg via ORAL
  Filled 2021-04-26: qty 1

## 2021-04-26 MED ORDER — BUPIVACAINE LIPOSOME 1.3 % IJ SUSP
INTRAMUSCULAR | Status: AC
  Filled 2021-04-26: qty 20

## 2021-04-26 MED ORDER — MECLIZINE HCL 25 MG PO TABS: 25.0000 mg | ORAL_TABLET | Freq: Three times a day (TID) | ORAL | Status: DC | PRN

## 2021-04-26 MED ORDER — SODIUM CHLORIDE 0.9% FLUSH
3.0000 mL | Freq: Two times a day (BID) | INTRAVENOUS | Status: DC
  Administered 2021-04-26: 3 mL via INTRAVENOUS

## 2021-04-26 MED ORDER — HYDROCODONE-ACETAMINOPHEN 7.5-325 MG PO TABS
1.0000 | ORAL_TABLET | Freq: Four times a day (QID) | ORAL | Status: DC
  Administered 2021-04-26 – 2021-04-27 (×4): 1 via ORAL
  Filled 2021-04-26 (×4): qty 1

## 2021-04-26 MED ORDER — SODIUM CHLORIDE 0.9% FLUSH: 3.0000 mL | INTRAVENOUS | Status: DC | PRN

## 2021-04-26 MED ORDER — SUGAMMADEX SODIUM 200 MG/2ML IV SOLN
INTRAVENOUS | Status: DC | PRN
  Administered 2021-04-26: 200 mg via INTRAVENOUS

## 2021-04-26 MED ORDER — PHENYLEPHRINE HCL-NACL 20-0.9 MG/250ML-% IV SOLN
INTRAVENOUS | Status: DC | PRN
Start: 2021-04-26 — End: 2021-04-26
  Administered 2021-04-26: 25 ug/min via INTRAVENOUS
  Administered 2021-04-26 (×2): 50 ug via INTRAVENOUS

## 2021-04-26 MED ORDER — ONDANSETRON HCL 4 MG PO TABS: 4.0000 mg | ORAL_TABLET | Freq: Four times a day (QID) | ORAL | Status: DC | PRN

## 2021-04-26 MED ORDER — DOCUSATE SODIUM 100 MG PO CAPS
100.0000 mg | ORAL_CAPSULE | Freq: Two times a day (BID) | ORAL | Status: DC
  Administered 2021-04-26 – 2021-04-27 (×2): 100 mg via ORAL
  Filled 2021-04-26 (×2): qty 1

## 2021-04-26 MED ORDER — BISACODYL 5 MG PO TBEC: 5.0000 mg | DELAYED_RELEASE_TABLET | Freq: Every day | ORAL | Status: DC | PRN

## 2021-04-26 MED ORDER — HYDROCODONE-ACETAMINOPHEN 7.5-325 MG PO TABS: 1.0000 | ORAL_TABLET | ORAL | Status: DC | PRN

## 2021-04-26 MED ORDER — ACETAMINOPHEN 325 MG PO TABS
650.0000 mg | ORAL_TABLET | ORAL | Status: DC | PRN
  Filled 2021-04-26: qty 2

## 2021-04-26 MED ORDER — 0.9 % SODIUM CHLORIDE (POUR BTL) OPTIME
TOPICAL | Status: DC | PRN
Start: 2021-04-26 — End: 2021-04-26
  Administered 2021-04-26: 1000 mL

## 2021-04-26 MED ORDER — MENTHOL 3 MG MT LOZG: 1.0000 | LOZENGE | OROMUCOSAL | Status: DC | PRN

## 2021-04-26 MED ORDER — HYDROCHLOROTHIAZIDE 12.5 MG PO TABS
12.5000 mg | ORAL_TABLET | Freq: Every day | ORAL | Status: DC
  Administered 2021-04-27: 12.5 mg via ORAL
  Filled 2021-04-26: qty 1

## 2021-04-26 MED ORDER — THROMBIN 20000 UNITS EX SOLR
CUTANEOUS | Status: DC | PRN
  Administered 2021-04-26: 20 mL

## 2021-04-26 MED ORDER — SODIUM CHLORIDE 0.9 % IV SOLN: 250.0000 mL | INTRAVENOUS | Status: DC

## 2021-04-26 MED ORDER — GABAPENTIN 100 MG PO CAPS
100.0000 mg | ORAL_CAPSULE | Freq: Three times a day (TID) | ORAL | Status: DC
  Administered 2021-04-26 – 2021-04-27 (×3): 100 mg via ORAL
  Filled 2021-04-26 (×3): qty 1

## 2021-04-26 MED ORDER — LATANOPROST 0.005 % OP SOLN
1.0000 [drp] | Freq: Every day | OPHTHALMIC | Status: DC
  Administered 2021-04-26: 1 [drp] via OPHTHALMIC
  Filled 2021-04-26: qty 2.5

## 2021-04-26 MED ORDER — EPHEDRINE SULFATE 50 MG/ML IJ SOLN
INTRAMUSCULAR | Status: DC | PRN
  Administered 2021-04-26: 5 mg via INTRAVENOUS
  Administered 2021-04-26 (×3): 10 mg via INTRAVENOUS

## 2021-04-26 MED ORDER — SODIUM CHLORIDE 0.9 % IV SOLN: INTRAVENOUS | Status: DC

## 2021-04-26 MED ORDER — PHENYLEPHRINE 40 MCG/ML (10ML) SYRINGE FOR IV PUSH (FOR BLOOD PRESSURE SUPPORT)
PREFILLED_SYRINGE | INTRAVENOUS | Status: AC
  Filled 2021-04-26: qty 10

## 2021-04-26 MED ORDER — LIDOCAINE 2% (20 MG/ML) 5 ML SYRINGE
INTRAMUSCULAR | Status: DC | PRN
  Administered 2021-04-26: 60 mg via INTRAVENOUS

## 2021-04-26 MED ORDER — EPHEDRINE 5 MG/ML INJ
INTRAVENOUS | Status: AC
  Filled 2021-04-26: qty 5

## 2021-04-26 MED ORDER — ACETAMINOPHEN 650 MG RE SUPP: 650.0000 mg | RECTAL | Status: DC | PRN

## 2021-04-26 MED ORDER — ONDANSETRON HCL 4 MG/2ML IJ SOLN
INTRAMUSCULAR | Status: DC | PRN
Start: 2021-04-26 — End: 2021-04-26
  Administered 2021-04-26: 4 mg via INTRAVENOUS

## 2021-04-26 MED ORDER — BUPIVACAINE LIPOSOME 1.3 % IJ SUSP
INTRAMUSCULAR | Status: DC | PRN
  Administered 2021-04-26: 40 mL

## 2021-04-26 MED ORDER — ROCURONIUM BROMIDE 10 MG/ML (PF) SYRINGE
PREFILLED_SYRINGE | INTRAVENOUS | Status: DC | PRN
  Administered 2021-04-26: 40 mg via INTRAVENOUS
  Administered 2021-04-26: 20 mg via INTRAVENOUS
  Administered 2021-04-26: 40 mg via INTRAVENOUS

## 2021-04-26 SURGICAL SUPPLY — 38 items
BAG COUNTER SPONGE SURGICOUNT (BAG) ×2 IMPLANT
BENZOIN TINCTURE PRP APPL 2/3 (GAUZE/BANDAGES/DRESSINGS) ×1 IMPLANT
BUR SABER RD CUTTING 3.0 (BURR) IMPLANT
CANISTER SUCT 3000ML PPV (MISCELLANEOUS) ×2 IMPLANT
COVER SURGICAL LIGHT HANDLE (MISCELLANEOUS) ×2 IMPLANT
DRAPE HALF SHEET 40X57 (DRAPES) IMPLANT
DRAPE MICROSCOPE LEICA (MISCELLANEOUS) ×2 IMPLANT
DRAPE SURG 17X23 STRL (DRAPES) ×8 IMPLANT
DRSG MEPILEX BORDER 4X8 (GAUZE/BANDAGES/DRESSINGS) ×1 IMPLANT
DURAPREP 26ML APPLICATOR (WOUND CARE) ×2 IMPLANT
ELECT REM PT RETURN 9FT ADLT (ELECTROSURGICAL) ×2
ELECTRODE REM PT RTRN 9FT ADLT (ELECTROSURGICAL) ×1 IMPLANT
GLOVE SRG 8 PF TXTR STRL LF DI (GLOVE) ×1 IMPLANT
GLOVE SURG 8.5 LATEX PF (GLOVE) ×2 IMPLANT
GLOVE SURG LTX SZ9 (GLOVE) ×2 IMPLANT
GLOVE SURG ORTHO LTX SZ7.5 (GLOVE) ×2 IMPLANT
GLOVE SURG UNDER POLY LF SZ8 (GLOVE) ×1
GOWN STRL REUS W/ TWL LRG LVL3 (GOWN DISPOSABLE) ×1 IMPLANT
GOWN STRL REUS W/TWL 2XL LVL3 (GOWN DISPOSABLE) ×4 IMPLANT
GOWN STRL REUS W/TWL LRG LVL3 (GOWN DISPOSABLE) ×1
KIT BASIN OR (CUSTOM PROCEDURE TRAY) ×2 IMPLANT
KIT TURNOVER KIT B (KITS) ×2 IMPLANT
NDL SPNL 18GX3.5 QUINCKE PK (NEEDLE) ×2 IMPLANT
NEEDLE SPNL 18GX3.5 QUINCKE PK (NEEDLE) ×4 IMPLANT
NS IRRIG 1000ML POUR BTL (IV SOLUTION) ×2 IMPLANT
PACK LAMINECTOMY ORTHO (CUSTOM PROCEDURE TRAY) ×2 IMPLANT
PAD ARMBOARD 7.5X6 YLW CONV (MISCELLANEOUS) ×4 IMPLANT
PATTIES SURGICAL .5 X.5 (GAUZE/BANDAGES/DRESSINGS) ×1 IMPLANT
PATTIES SURGICAL 1X1 (DISPOSABLE) ×1 IMPLANT
SPONGE SURGIFOAM ABS GEL 100 (HEMOSTASIS) ×1 IMPLANT
SPONGE T-LAP 4X18 ~~LOC~~+RFID (SPONGE) IMPLANT
STRIP CLOSURE SKIN 1/2X4 (GAUZE/BANDAGES/DRESSINGS) ×1 IMPLANT
SUT VIC AB 1 CT1 27 (SUTURE) ×2
SUT VIC AB 1 CT1 27XBRD ANBCTR (SUTURE) IMPLANT
SUT VIC AB 3-0 X1 27 (SUTURE) ×1 IMPLANT
TOWEL GREEN STERILE (TOWEL DISPOSABLE) ×2 IMPLANT
TOWEL GREEN STERILE FF (TOWEL DISPOSABLE) ×2 IMPLANT
TRAY FOLEY MTR SLVR 16FR STAT (SET/KITS/TRAYS/PACK) IMPLANT

## 2021-04-26 NOTE — Brief Op Note (Signed)
04/26/2021  10:08 AM  PATIENT:  Cassandra Brooks  81 y.o. female  PRE-OPERATIVE DIAGNOSIS:  severe lumbar spinal stenosis L2-3, L3-4  POST-OPERATIVE DIAGNOSIS:  severe lumbar spinal stenosis L2-3, L3-4  PROCEDURE:  Procedure(s): CENTRAL LAMINECTOMIES LUMBAR TWO-THREE,  LUMBAR THREE-FOUR WITH BILATERAL FORAMINOTOMIES (N/A)  SURGEON:  Surgeon(s) and Role:    * Jessy Oto, MD - Primary  PHYSICIAN ASSISTANT: Benjiman Core, PA-C  ANESTHESIA:   local and general  EBL:  50 mL   BLOOD ADMINISTERED:none  DRAINS: Urinary Catheter (Foley)   LOCAL MEDICATIONS USED:  MARCAINE 0.5% 1:1 EXPAREL 1.3% Amount: 40 ml  SPECIMEN:  No Specimen  DISPOSITION OF SPECIMEN:  N/A  COUNTS:  YES  TOURNIQUET:  * No tourniquets in log *  DICTATION: .Dragon Dictation  PLAN OF CARE: Admit for overnight observation  PATIENT DISPOSITION:  PACU - hemodynamically stable.   Delay start of Pharmacological VTE agent (>24hrs) due to surgical blood loss or risk of bleeding: yes

## 2021-04-26 NOTE — Discharge Instructions (Addendum)
° ° °  No lifting greater than 10 lbs. °Avoid bending, stooping and twisting. °Walk in house for first week them may start to get out slowly increasing distance up to one quarter mile by 3 weeks post op. °Keep incision dry for 3 days, may use tegaderm or similar water impervious dressing. ° °

## 2021-04-26 NOTE — Op Note (Signed)
04/26/2021  10:11 AM  PATIENT:  Cassandra Brooks  81 y.o. female  MRN: [id number]  OPERATIVE REPORT  PRE-OPERATIVE DIAGNOSIS:  severe lumbar spinal stenosis L2-3, L3-4  POST-OPERATIVE DIAGNOSIS:  severe lumbar spinal stenosis L2-3, L3-4  PROCEDURE:  Procedure(s): CENTRAL LAMINECTOMIES LUMBAR TWO-THREE,  LUMBAR THREE-FOUR WITH BILATERAL FORAMINOTOMIES    SURGEON:  Jessy Oto, MD     ASSISTANT:  Benjiman Core, PA-C  (Present throughout the entire procedure and necessary for completion of procedure in a timely manner)     ANESTHESIA:  General, supplemented with local marcaine 0.5% 1:1 exparel 1.3% total 40 cc. Dr. Suzette Battiest.    COMPLICATIONS:  None.     EBL: [id number]  COMPLICATIONS: None  DRAINS: Foley to SD, discontinued at the end of the procedure.  PROCEDURE: The patient was met in the holding area, and the appropriate L2-3 and L3-4 levels identified and not marked with an "X" and my initials since this was a bilateral procedure. The patient was then transported to OR and was placed under general anesthesia without difficulty. Then placed on the operative table in a prone position. [facility] spine OR table was used. The patient received appropriate preoperative antibiotic prophylaxis. Nursing staff inserted a Foley catheter under sterile conditions prior to turning. Standard prep with DuraPrep solution from the mid dorsal spine to the mid sacral level.Time-out procedure was called and correct .  Patient was draped in the usual manner. Iodine Vi-Drape was used. 2 x 18-gauge spinal needles were placed at the expected and of the expected levels. Intraoperative lateral C-arm demonstrated the lower needle placed posterior to the L3-4 level disc and at the L3-4 interspinous process level.  Initial incisions were made at palpable process representing L2 and at the lowest aspect of the incision at the expected level of L4 spinous process, approximately 2 1/2inches in length. The  incision were infiltrated with marcaine 1/2% plain 1:1 exparel 1.3% Total of 40 cc was used. Skin subcutaneous layers divide down to the lumbodorsal fascia this was incised in both L2, L3 and Superior L4 spinous processes, clamps placed at the interspinous process at L2-3 and L3-4. A lateral radiograph demonstrated the upper clamp at the upper spinous process of L3. Lower clamp was at the L3-4 interspinous space. The L2-3 to L4-5 levels were then exposed using Cobb elevators and electrocautery. These areas were then packed. Boss McCollough retractor was inserted at the incision site. Leksell rongeur used to remove a portion of the inferior aspect is process of L2 40% and the entire spinous processes of L3 and superior 20% of L4. Leksell rongeur was then used to further remove bone down to the ligamentum flavum and the central portions of the lamina and base of the spinous process were thinned. 4 mm burr was also used to thin the lamina of L3 centrally. The facets were then exposed out laterally and were hypertrophic.Osteotomes then used medial aspect of the L3-4 facet and L2-3 facet bilaterally resecting approximately 10-15% of the facet bilaterally. Kerrisons were then used to resect central portions of the superior lamina of L4 and the entire L3 lamina. Ligamentum flavum at the L2-3 and L3-4 levels were then carefully resected centrally preserving at least 7-8 mm with at the pars level L2 and L3. The central laminectomy was further widened performing more resection of the medial aspect of facet at both L2-3 and L3-4. Note that loupe magnification and headlight was used for this portion procedure.the central portions of the ligamentum flavum at  the L2-3 level were resected and the medial aspect of the L2-3 facet resected over about 5-10%. Hypertrophic flava was found to be present. The operating room microscope sterilely draped brought into the field and then carefully the right side decompressed at each level  beginning at the right L4 neuroforamen resect bone over the superior lamina of L4 and decompressing the right L4 foramen and reflected portion ligamentum flavum off the medial aspect of the right L3-4 facet. Hockey-stick nerve probe could be passed out both the L4 and L3 neuroforamen. The medial aspect of facet at the L3-4 level was then carefully evaluated and hypertrophic ligamentum flavum resected using Kerrisons decompressing the lateral recess on this right side. This was done such that hockey-stick nerve probe could be used to pass the outer recess demonstrating patency and decompression of the L4 nerve root and L3 nerve roots. Attention then turned to the L2-3 were further debridement of the lateral recess and hypertrophic ligamentum flavum and reflected portions of ligamentum flavum were carried out. Changing sides of the OR table in decompression was carried out in similar fashion on the left side from the left L3 nerve root to the L2-3 level. At the left L3-4 level moderate lateral recess stenosis was noted lateral recess was decompressed and the L4 nerve root freed up.  Further decompression was then carried out the left side of the spinal canal decompressing the L 4, the L3 and the L2 and of of a nerve roots. Following this then a hockey-stick nerve probe could be passed out easily bilateral L2-L3 and L4  neuroforamen. Following decompression bilaterally thrombin-soaked Gelfoam was applied to the laminotomy defects and these areas packed with small sponges. Adequate hemostasis was obtained at all levels. The thrombin-soaked Gelfoam was then removed and the L2-3 and L3-4 discs were examined bilaterally, found to be bulging but not herniated. Boss McCollough retractor was then removed. The end of the case, an approximately 15 mm central laminotomy was present with normal pulsation of the thecal sac present. Following additional irrigation and with no active bleeding present at any level level, the incision  was then closed by first approximating the lumbar muscles the midline with interrupted #1 Vicryl sutures loosely the lumbodorsal fascia then approximated with interrupted #1 Vicryl sutures. Deep subcutaneous layers approximated with interrupted #0 Vicryl suture more superficial layers with interrupted 2-0 Vicryl suture the skin closed with a running subcutaneous stitch of 4-0 Vicryl. Dermabond was applied to the skin margins.  A mepilex bandage was applied to the midline incision site and tegaderm to the hemovac drain exit site. All instrument and sponge counts were correct. Patient was then reactivated returned to supine position and extubated. He was then returned to recovery room in satisfactory condition.   Benjiman Core, PA-C performed the duties of assistant surgeon throughout this case she assisted using loupe magnification and OR microscope retracting delicate neural structures suctioning over the neural structures throughout the case bilaterally . He was present from the beginning of the case to the end of the case. Assisted in positioning the patient and  the arm and legs. He also participated in removal the patient from the operating table.    Basil Dess  04/26/2021, 10:11 AM

## 2021-04-26 NOTE — Transfer of Care (Signed)
Immediate Anesthesia Transfer of Care Note  Patient: Cassandra Brooks  Procedure(s) Performed: CENTRAL LAMINECTOMIES LUMBAR TWO-THREE,  LUMBAR THREE-FOUR WITH BILATERAL FORAMINOTOMIES (Spine Lumbar)  Patient Location: PACU  Anesthesia Type:General  Level of Consciousness: drowsy, patient cooperative and responds to stimulation  Airway & Oxygen Therapy: Patient Spontanous Breathing and Patient connected to nasal cannula oxygen  Post-op Assessment: Report given to RN, Post -op Vital signs reviewed and stable and Patient moving all extremities X 4  Post vital signs: Reviewed and stable  Last Vitals:  Vitals Value Taken Time  BP 141/66 04/26/21 1038  Temp    Pulse 84 04/26/21 1040  Resp 19 04/26/21 1041  SpO2 100 % 04/26/21 1040  Vitals shown include unvalidated device data.  Last Pain:  Vitals:   04/26/21 0617  TempSrc: Oral         Complications: No notable events documented.

## 2021-04-26 NOTE — Interval H&P Note (Signed)
History and Physical Interval Note:  04/26/2021 7:36 AM  Cassandra Brooks  has presented today for surgery, with the diagnosis of severe lumbar spinal stenosis L2-3, L3-4.  The various methods of treatment have been discussed with the patient and family. After consideration of risks, benefits and other options for treatment, the patient has consented to  Procedure(s): CENTRAL LAMINECTOMIES L2-3,  L3-4 WITH BILATERAL FORAMINOTOMIES (N/A) as a surgical intervention.  The patient's history has been reviewed, patient examined, no change in status, stable for surgery.  I have reviewed the patient's chart and labs.  Questions were answered to the patient's satisfaction.     Basil Dess

## 2021-04-26 NOTE — Anesthesia Procedure Notes (Signed)
Procedure Name: Intubation Date/Time: 04/26/2021 7:55 AM Performed by: Michele Rockers, CRNA Pre-anesthesia Checklist: Patient identified, Patient being monitored, Timeout performed, Emergency Drugs available and Suction available Patient Re-evaluated:Patient Re-evaluated prior to induction Oxygen Delivery Method: Circle System Utilized Preoxygenation: Pre-oxygenation with 100% oxygen Induction Type: IV induction Ventilation: Mask ventilation without difficulty Laryngoscope Size: Miller and 2 Grade View: Grade I Tube type: Oral Tube size: 7.0 mm Number of attempts: 1 Airway Equipment and Method: Stylet Placement Confirmation: ETT inserted through vocal cords under direct vision, positive ETCO2 and breath sounds checked- equal and bilateral Secured at: 21 cm Tube secured with: Tape Dental Injury: Teeth and Oropharynx as per pre-operative assessment

## 2021-04-27 ENCOUNTER — Encounter (HOSPITAL_COMMUNITY): Payer: Self-pay | Admitting: Specialist

## 2021-04-27 DIAGNOSIS — M48061 Spinal stenosis, lumbar region without neurogenic claudication: Secondary | ICD-10-CM | POA: Diagnosis not present

## 2021-04-27 LAB — BASIC METABOLIC PANEL
Anion gap: 12 (ref 5–15)
BUN: 19 mg/dL (ref 8–23)
CO2: 22 mmol/L (ref 22–32)
Calcium: 8.8 mg/dL — ABNORMAL LOW (ref 8.9–10.3)
Chloride: 105 mmol/L (ref 98–111)
Creatinine, Ser: 0.95 mg/dL (ref 0.44–1.00)
GFR, Estimated: >60 mL/min (ref >60)
Glucose, Bld: 153 mg/dL — ABNORMAL HIGH (ref 70–99)
Potassium: 3.6 mmol/L (ref 3.5–5.1)
Sodium: 139 mmol/L (ref 135–145)

## 2021-04-27 LAB — CBC
HCT: 34.4 % — ABNORMAL LOW (ref 36.0–46.0)
Hemoglobin: 11.8 g/dL — ABNORMAL LOW (ref 12.0–15.0)
MCH: 35.2 pg — ABNORMAL HIGH (ref 26.0–34.0)
MCHC: 34.3 g/dL (ref 30.0–36.0)
MCV: 102.7 fL — ABNORMAL HIGH (ref 80.0–100.0)
Platelets: 175 10*3/uL (ref 150–400)
RBC: 3.35 MIL/uL — ABNORMAL LOW (ref 3.87–5.11)
RDW: 13.1 % (ref 11.5–15.5)
WBC: 11.1 10*3/uL — ABNORMAL HIGH (ref 4.0–10.5)
nRBC: 0 % (ref 0.0–0.2)

## 2021-04-27 MED ORDER — GABAPENTIN 100 MG PO CAPS: 100.0000 mg | ORAL_CAPSULE | Freq: Three times a day (TID) | ORAL | 1 refills | Status: DC

## 2021-04-27 MED ORDER — HYDROCODONE-ACETAMINOPHEN 7.5-325 MG PO TABS: 1.0000 | ORAL_TABLET | Freq: Four times a day (QID) | ORAL | 0 refills | Status: DC | PRN

## 2021-04-27 MED ORDER — METHOCARBAMOL 500 MG PO TABS: 500.0000 mg | ORAL_TABLET | Freq: Four times a day (QID) | ORAL | 1 refills | Status: DC | PRN

## 2021-04-27 MED ORDER — DOCUSATE SODIUM 100 MG PO CAPS: 100.0000 mg | ORAL_CAPSULE | Freq: Two times a day (BID) | ORAL | 0 refills | Status: DC

## 2021-04-27 MED FILL — Thrombin (Recombinant) For Soln 20000 Unit: CUTANEOUS | Qty: 1 | Status: AC

## 2021-04-27 NOTE — Progress Notes (Signed)
° ° ° °  Subjective: 1 Day Post-Op Procedure(s) (LRB): CENTRAL LAMINECTOMIES LUMBAR TWO-THREE,  LUMBAR THREE-FOUR WITH BILATERAL FORAMINOTOMIES (N/A) Awake, alert and oriented x 4. Dressing changed, voiding without difficulty. Tolerating pos nourishment and medications. PT indicates she is okay without home health.  Patient reports pain as moderate.    Objective:   VITALS:  Temp:  [97.5 F (36.4 C)-98.2 F (36.8 C)] 97.7 F (36.5 C) (01/17 0807) Pulse Rate:  [68-84] 75 (01/17 0807) Resp:  [14-20] 16 (01/17 0807) BP: (116-179)/(61-85) 154/66 (01/17 0807) SpO2:  [89 %-100 %] 96 % (01/17 0807)  Neurologically intact ABD soft Neurovascular intact Sensation intact distally Intact pulses distally Dorsiflexion/Plantar flexion intact Incision: scant drainage and dressing changed, no bleeding.   LABS Recent Labs    04/27/21 0617  HGB 11.8*  WBC 11.1*  PLT 175   Recent Labs    04/27/21 0617  NA 139  K 3.6  CL 105  CO2 22  BUN 19  CREATININE 0.95  GLUCOSE 153*   No results for input(s): LABPT, INR in the last 72 hours.   Assessment/Plan: 1 Day Post-Op Procedure(s) (LRB): CENTRAL LAMINECTOMIES LUMBAR TWO-THREE,  LUMBAR THREE-FOUR WITH BILATERAL FORAMINOTOMIES (N/A) Anemia due to blood loss, minimal.   Advance diet Up with therapy D/C IV fluids Discharge home, hold on home health.   Basil Dess 04/27/2021, 10:16 AM Patient ID: Cassandra Brooks, female   DOB: 1941-02-13, 81 y.o.   MRN: [id number]

## 2021-04-27 NOTE — Anesthesia Postprocedure Evaluation (Signed)
Anesthesia Post Note  Patient: Cassandra Brooks  Procedure(s) Performed: CENTRAL LAMINECTOMIES LUMBAR TWO-THREE,  LUMBAR THREE-FOUR WITH BILATERAL FORAMINOTOMIES (Spine Lumbar)     Patient location during evaluation: PACU Anesthesia Type: General Level of consciousness: awake and alert Pain management: pain level controlled Vital Signs Assessment: post-procedure vital signs reviewed and stable Respiratory status: spontaneous breathing, nonlabored ventilation, respiratory function stable and patient connected to nasal cannula oxygen Cardiovascular status: blood pressure returned to baseline and stable Postop Assessment: no apparent nausea or vomiting Anesthetic complications: no   No notable events documented.  Last Vitals:  Vitals:   04/27/21 0418 04/27/21 0807  BP:  (!) 154/66  Pulse: 76 75  Resp:  16  Temp:  36.5 C  SpO2: 96% 96%    Last Pain:  Vitals:   04/27/21 1025  TempSrc:   PainSc: 7                  Tiajuana Amass

## 2021-04-27 NOTE — Evaluation (Signed)
Occupational Therapy Evaluation Patient Details Name: Cassandra Brooks MRN: [id number] DOB: 1940/11/04 Today's Date: 04/27/2021   History of Present Illness Pt is a 81 yr old female s/p laninectomies lumbar 2-3,3-4 due to back pain and LE radiculopathy. PMH: history of L2-3 and L3-4 stenosis low back pain and bilateral lower extremity, AAA, dizziness, falls , HTN, pre-diabetic, OA of L knee, LTKA, syncope   Clinical Impression   Pt at PLOF lives with husband who will be present 24/7 as needed with the return to home. Pt required min assist for LE dressing but husband reported they will assist and did not want to use AE with the return to home. Pt was educated on how to complete ADL while following precautions. Signing off for Occupational Therapy. Please re consult if needed.        Recommendations for follow up therapy are one component of a multi-disciplinary discharge planning process, led by the attending physician.  Recommendations may be updated based on patient status, additional functional criteria and insurance authorization.   Follow Up Recommendations  Follow physician's recommendations for discharge plan and follow up therapies    Assistance Recommended at Discharge Intermittent Supervision/Assistance  Patient can return home with the following A little help with bathing/dressing/bathroom;Assistance with cooking/housework;Assist for transportation    Functional Status Assessment  Patient has had a recent decline in their functional status and demonstrates the ability to make significant improvements in function in a reasonable and predictable amount of time.  Equipment Recommendations  None recommended by OT    Recommendations for Other Services       Precautions / Restrictions Precautions Precautions: Back Precaution Booklet Issued: Yes (comment) Precaution Comments: Pt needed education on precautions Restrictions Weight Bearing Restrictions: No      Mobility Bed  Mobility Overal bed mobility: Needs Assistance Bed Mobility: Rolling, Supine to Sit, Sit to Supine Rolling: Supervision   Supine to sit: Supervision Sit to supine: Supervision   General bed mobility comments: cues on log roll and needed use of bed rail    Transfers Overall transfer level: Needs assistance Equipment used: Rolling walker (2 wheels) Transfers: Sit to/from Stand Sit to Stand: Supervision                  Balance Overall balance assessment: Mild deficits observed, not formally tested                                         ADL either performed or assessed with clinical judgement   ADL Overall ADL's : Needs assistance/impaired Eating/Feeding: Independent;Sitting   Grooming: Wash/dry face;Wash/dry hands;Supervision/safety;Standing   Upper Body Bathing: Set up;Sitting   Lower Body Bathing: Sit to/from stand;Minimal assistance   Upper Body Dressing : Set up;Sitting   Lower Body Dressing: Minimal assistance   Toilet Transfer: Supervision/safety;Cueing for sequencing;Cueing for safety;Rolling walker (2 wheels)   Toileting- Clothing Manipulation and Hygiene: Supervision/safety;Cueing for safety;Cueing for sequencing;Sit to/from stand   Tub/ Shower Transfer: Min guard;Cueing for safety;Cueing for sequencing   Functional mobility during ADLs: Supervision/safety;Rolling walker (2 wheels)       Vision Baseline Vision/History: 1 Wears glasses Ability to See in Adequate Light: 0 Adequate Patient Visual Report: No change from baseline       Perception     Praxis      Pertinent Vitals/Pain Pain Assessment Pain Assessment: 0-10 Pain Score: 4  Pain Location: sx site and  dull headache Pain Descriptors / Indicators: Aching     Hand Dominance Right   Extremity/Trunk Assessment Upper Extremity Assessment Upper Extremity Assessment: Overall WFL for tasks assessed   Lower Extremity Assessment Lower Extremity Assessment: Defer to PT  evaluation   Cervical / Trunk Assessment Cervical / Trunk Assessment: Back Surgery   Communication Communication Communication: No difficulties   Cognition Arousal/Alertness: Awake/alert Behavior During Therapy: WFL for tasks assessed/performed Overall Cognitive Status: Within Functional Limits for tasks assessed                                       General Comments       Exercises     Shoulder Instructions      Home Living Family/patient expects to be discharged to:: Private residence Living Arrangements: Spouse/significant other Available Help at Discharge: Family Type of Home: House Home Access: Stairs to enter Technical brewer of Steps: 2 Entrance Stairs-Rails: Left Home Layout: One level     Bathroom Shower/Tub: Walk-in shower (also tub)         Home Equipment: Conservation officer, nature (2 wheels);Cane - single point;Cane - quad;BSC/3in1;Wheelchair - manual;Grab bars - tub/shower (Pt's husband reported they have tried to use other shower chairs but does not fit)          Prior Functioning/Environment Prior Level of Function : Independent/Modified Independent             Mobility Comments: Pt reported they were completing house hold level of ambulation due to increase level of pain in BLE limiting ability to complete tasks          OT Problem List: Decreased strength;Impaired balance (sitting and/or standing);Decreased activity tolerance;Decreased knowledge of use of DME or AE;Pain      OT Treatment/Interventions: Self-care/ADL training;Therapeutic exercise;DME and/or AE instruction;Therapeutic activities;Patient/family education;Balance training    OT Goals(Current goals can be found in the care plan section) Acute Rehab OT Goals Patient Stated Goal: to return home OT Goal Formulation: With patient Time For Goal Achievement: 05/11/21 Potential to Achieve Goals: Good  OT Frequency: Min 2X/week    Co-evaluation               AM-PAC OT "6 Clicks" Daily Activity     Outcome Measure Help from another person eating meals?: None Help from another person taking care of personal grooming?: None Help from another person toileting, which includes using toliet, bedpan, or urinal?: A Little Help from another person bathing (including washing, rinsing, drying)?: A Little Help from another person to put on and taking off regular upper body clothing?: None Help from another person to put on and taking off regular lower body clothing?: A Little 6 Click Score: 21   End of Session Equipment Utilized During Treatment: Rolling walker (2 wheels);Gait belt Nurse Communication: Mobility status  Activity Tolerance: Patient tolerated treatment well Patient left: in bed;with call bell/phone within reach;with family/visitor present  OT Visit Diagnosis: Unsteadiness on feet (R26.81);Other abnormalities of gait and mobility (R26.89)                Time: 7867-6720 OT Time Calculation (min): 32 min Charges:  OT General Charges $OT Visit: 1 Visit OT Evaluation $OT Eval Low Complexity: 1 Low OT Treatments $Self Care/Home Management : 8-22 mins  Joeseph Amor OTR/L  Acute Rehab Services  [phone] office number [phone] pager number   Joeseph Amor 04/27/2021, 8:13 AM

## 2021-04-27 NOTE — Plan of Care (Signed)

## 2021-04-27 NOTE — Progress Notes (Signed)
Patient awaiting transport via wheelchair by volunteer for discharge home; in no acute distress nor complaints of pain nor discomfort; incision on her back with honeycomb dressing and is clean, dry and intact; room was checked and accounted for all her belongings; discharge instructions concerning her medications, follow-up appointment, incision care and when to call the doctor as needed were all discussed with patient and her husband and both expressed understanding on the instructions given.

## 2021-04-27 NOTE — Evaluation (Signed)
Physical Therapy Evaluation and Discharge Patient Details Name: Cassandra Brooks MRN: [id number] DOB: [birth year] Today's Date: 04/27/2021  History of Present Illness  Pt is a 81 yr old female s/p laninectomies lumbar 2-3,3-4 due to back pain and LE radiculopathy. PMH: history of L2-3 and L3-4 stenosis low back pain and bilateral lower extremity, AAA, dizziness, falls , HTN, pre-diabetic, OA of L knee, LTKA, syncope  Clinical Impression  Pt admitted s/p procedure listed above. Presents with good pain control and seemingly improved activity tolerance in comparison to baseline. Pt ambulating 200 feet with a walker at a supervision level. Negotiated 2 steps with a railing and handheld assist to simulate home set up. Recommended use of walker for all mobility initially for improved stability and independence. Reviewed spinal precautions and activity recommendations. Pt/pt family with no further questions or concerns. Thank you for this consult.     Recommendations for follow up therapy are one component of a multi-disciplinary discharge planning process, led by the attending physician.  Recommendations may be updated based on patient status, additional functional criteria and insurance authorization.  Follow Up Recommendations No PT follow up    Assistance Recommended at Discharge PRN  Patient can return home with the following  A little help with walking and/or transfers;A little help with bathing/dressing/bathroom;Assistance with cooking/housework;Help with stairs or ramp for entrance    Equipment Recommendations None recommended by PT  Recommendations for Other Services       Functional Status Assessment Patient has had a recent decline in their functional status and demonstrates the ability to make significant improvements in function in a reasonable and predictable amount of time.     Precautions / Restrictions Precautions Precautions: Back Precaution Booklet Issued: Yes  (comment) Precaution Comments: Pt needed education on precautions Restrictions Weight Bearing Restrictions: No      Mobility  Bed Mobility Overal bed mobility: Modified Independent             General bed mobility comments: Increased time/effort    Transfers Overall transfer level: Needs assistance Equipment used: Rolling walker (2 wheels) Transfers: Sit to/from Stand Sit to Stand: Supervision                Ambulation/Gait Ambulation/Gait assistance: Supervision Gait Distance (Feet): 200 Feet Assistive device: Rolling walker (2 wheels) Gait Pattern/deviations: Step-through pattern, Decreased stride length, Decreased step length - left Gait velocity: decreased     General Gait Details: Moderate reliance through arms on walker, decreased L step length, no gross imbalance noted  Stairs Stairs: Yes Stairs assistance: Min assist Stair Management: One rail Left Number of Stairs: 2 General stair comments: Assist for balance, cues for step by step pattern  Wheelchair Mobility    Modified Rankin (Stroke Patients Only)       Balance Overall balance assessment: Needs assistance Sitting-balance support: Feet supported Sitting balance-Leahy Scale: Good     Standing balance support: No upper extremity supported, During functional activity Standing balance-Leahy Scale: Poor Standing balance comment: min guard for safety with no AD                             Pertinent Vitals/Pain Pain Assessment Pain Assessment: Faces Faces Pain Scale: Hurts a little bit Pain Location: sx site Pain Descriptors / Indicators: Aching Pain Intervention(s): Monitored during session    Home Living Family/patient expects to be discharged to:: Private residence Living Arrangements: Spouse/significant other Available Help at Discharge: Family Type of Home: House  Home Access: Stairs to enter Entrance Stairs-Rails: Left Entrance Stairs-Number of Steps: 2   Home  Layout: One level Home Equipment: Conservation officer, nature (2 wheels);Cane - single point;Cane - quad;BSC/3in1;Wheelchair - manual;Grab bars - tub/shower (Pt's husband reported they have tried to use other shower chairs but does not fit)      Prior Function Prior Level of Function : Independent/Modified Independent             Mobility Comments: Pt reported they were completing house hold level of ambulation due to increase level of pain in BLE limiting ability to complete tasks       Hand Dominance   Dominant Hand: Right    Extremity/Trunk Assessment   Upper Extremity Assessment Upper Extremity Assessment: Defer to OT evaluation    Lower Extremity Assessment Lower Extremity Assessment: Overall WFL for tasks assessed    Cervical / Trunk Assessment Cervical / Trunk Assessment: Back Surgery  Communication   Communication: No difficulties  Cognition Arousal/Alertness: Awake/alert Behavior During Therapy: WFL for tasks assessed/performed Overall Cognitive Status: Within Functional Limits for tasks assessed                                          General Comments      Exercises     Assessment/Plan    PT Assessment Patient does not need any further PT services  PT Problem List         PT Treatment Interventions      PT Goals (Current goals can be found in the Care Plan section)  Acute Rehab PT Goals Patient Stated Goal: go home PT Goal Formulation: All assessment and education complete, DC therapy    Frequency       Co-evaluation               AM-PAC PT "6 Clicks" Mobility  Outcome Measure Help needed turning from your back to your side while in a flat bed without using bedrails?: None Help needed moving from lying on your back to sitting on the side of a flat bed without using bedrails?: None Help needed moving to and from a bed to a chair (including a wheelchair)?: A Little Help needed standing up from a chair using your arms (e.g.,  wheelchair or bedside chair)?: A Little Help needed to walk in hospital room?: A Little Help needed climbing 3-5 steps with a railing? : A Little 6 Click Score: 20    End of Session   Activity Tolerance: Patient tolerated treatment well Patient left: in bed;with call bell/phone within reach;with family/visitor present Nurse Communication: Mobility status PT Visit Diagnosis: Unsteadiness on feet (R26.81)    Time: 9678-9381 PT Time Calculation (min) (ACUTE ONLY): 16 min   Charges:   PT Evaluation $PT Eval Low Complexity: 1 Low          Wyona Almas, PT, DPT Acute Rehabilitation Services Pager [phone] Office [phone]   Cassandra Brooks 04/27/2021, 10:57 AM

## 2021-05-07 ENCOUNTER — Encounter: Payer: Self-pay | Admitting: Surgery

## 2021-05-07 ENCOUNTER — Other Ambulatory Visit: Payer: Self-pay

## 2021-05-07 ENCOUNTER — Ambulatory Visit (INDEPENDENT_AMBULATORY_CARE_PROVIDER_SITE_OTHER): Payer: PPO | Admitting: Surgery

## 2021-05-07 VITALS — BP 137/83 | HR 85 | Ht 61.0 in | Wt 175.0 lb

## 2021-05-07 DIAGNOSIS — Z9889 Other specified postprocedural states: Secondary | ICD-10-CM

## 2021-05-07 MED ORDER — METHOCARBAMOL 500 MG PO TABS: 500.0000 mg | ORAL_TABLET | Freq: Three times a day (TID) | ORAL | 0 refills | Status: DC | PRN

## 2021-05-07 NOTE — Progress Notes (Addendum)
81 year old white female who is about a week and a half out from L2-3 and L3-4 bilateral foraminotomies and central laminectomies returns.  States that she is doing well.  Preop leg pain much improved.   Exam Very pleasant elderly white female alert and oriented in no acute distress.  Surgical incision looks good.  No drainage or signs of infection.  New Steri-Strips applied.  Neurologically intact.  Plan Patient will gradually increase walking distances.  Follow-up with me in 5 weeks for recheck.  If she continues to do well we will plan on releasing her at that time.  All questions answered.  I sent in refill of Robaxin to patient's pharmacy.

## 2021-05-07 NOTE — Addendum Note (Signed)
Addended by: Lanae Crumbly on: 05/07/2021 10:48 AM   Modules accepted: Orders

## 2021-05-10 NOTE — Discharge Summary (Signed)
Patient ID: Cassandra Brooks MRN: [id number] DOB/AGE: [birth year] 81 y.o.  Admit date: 04/26/2021 Discharge date: 04/27/2021  Admission Diagnoses:  Principal Problem:   Spinal stenosis of lumbar region with neurogenic claudication Active Problems:   Spinal stenosis, lumbar region with neurogenic claudication   Discharge Diagnoses:  Principal Problem:   Spinal stenosis of lumbar region with neurogenic claudication Active Problems:   Spinal stenosis, lumbar region with neurogenic claudication  status post Procedure(s): CENTRAL LAMINECTOMIES LUMBAR TWO-THREE,  LUMBAR THREE-FOUR WITH BILATERAL FORAMINOTOMIES  Past Medical History:  Diagnosis Date   AAA (abdominal aortic aneurysm)    Arthritis    "knees" (03/21/2017)   Borderline diabetes    Bulging lumbar disc    Depression    Disequilibrium    Dizziness    Essential hypertension 12/19/2019   Fall    Fibromyalgia    GERD (gastroesophageal reflux disease)    Glaucoma, both eyes    Hemoptysis 03/18/2015   High cholesterol    "don't have it but I take RX" (03/21/2017)   Hypertension    Mixed dyslipidemia 12/19/2019   Nuclear cataract of both eyes 01/01/2014   Pre-diabetes    pt states she is not diabetic. Last A1C was normal. Pt is on no medications for DM   Preoperative cardiovascular examination 12/19/2019   Primary osteoarthritis of left knee 07/18/2019   Solitary pulmonary nodule 03/18/2015   03/2015 69mm RLL nodule    Status post AAA (abdominal aortic aneurysm) repair 12/19/2019   Status post total left knee replacement 03/27/2020   Syncope 03/21/2017   Syncope and collapse     Surgeries: Procedure(s): CENTRAL LAMINECTOMIES LUMBAR TWO-THREE,  LUMBAR THREE-FOUR WITH BILATERAL FORAMINOTOMIES on 04/26/2021   Consultants:   Discharged Condition: Improved  Hospital Course: Cassandra Brooks is an 81 y.o. female who was admitted 04/26/2021 for operative treatment of Spinal stenosis of lumbar region with neurogenic  claudication. Patient failed conservative treatments (please see the history and physical for the specifics) and had severe unremitting pain that affects sleep, daily activities and work/hobbies. After pre-op clearance, the patient was taken to the operating room on 04/26/2021 and underwent  Procedure(s): CENTRAL LAMINECTOMIES LUMBAR TWO-THREE,  LUMBAR THREE-FOUR WITH BILATERAL FORAMINOTOMIES.    Patient was given perioperative antibiotics:  Anti-infectives (From admission, onward)    Start     Dose/Rate Route Frequency Ordered Stop   04/26/21 1600  ceFAZolin (ANCEF) IVPB 2g/100 mL premix        2 g 200 mL/hr over 30 Minutes Intravenous Every 8 hours 04/26/21 1136 04/27/21 0007   04/26/21 0615  ceFAZolin (ANCEF) IVPB 2g/100 mL premix        2 g 200 mL/hr over 30 Minutes Intravenous On call to O.R. 04/26/21 0606 04/26/21 0813        Patient was given sequential compression devices and early ambulation to prevent DVT.   Patient benefited maximally from hospital stay and there were no complications. At the time of discharge, the patient was urinating/moving their bowels without difficulty, tolerating a regular diet, pain is controlled with oral pain medications and they have been cleared by PT/OT.   Recent vital signs: No data found.   Recent laboratory studies: No results for input(s): WBC, HGB, HCT, PLT, NA, K, CL, CO2, BUN, CREATININE, GLUCOSE, INR, CALCIUM in the last 72 hours.  Invalid input(s): PT, 2   Discharge Medications:   Allergies as of 04/27/2021   No Known Allergies      Medication List     STOP  taking these medications    predniSONE 10 MG (21) Tbpk tablet Commonly known as: STERAPRED UNI-PAK 21 TAB   traMADol 50 MG tablet Commonly known as: ULTRAM       TAKE these medications    acetaminophen 500 MG tablet Commonly known as: TYLENOL Take 1,000 mg by mouth every 6 (six) hours as needed for mild pain.   amLODipine 10 MG tablet Commonly known as:  NORVASC Take 10 mg by mouth daily.   aspirin EC 81 MG tablet Take 81 mg by mouth daily. Swallow whole.   docusate sodium 100 MG capsule Commonly known as: COLACE Take 1 capsule (100 mg total) by mouth 2 (two) times daily.   dorzolamide-timolol 22.3-6.8 MG/ML ophthalmic solution Commonly known as: COSOPT Place 1 drop into both eyes 2 (two) times daily.   fluticasone 50 MCG/ACT nasal spray Commonly known as: FLONASE Place 2 sprays into both nostrils 2 (two) times daily.   gabapentin 100 MG capsule Commonly known as: NEURONTIN Take 1 capsule (100 mg total) by mouth 3 (three) times daily.   hydrochlorothiazide 12.5 MG tablet Commonly known as: HYDRODIURIL Take 12.5 mg by mouth daily.   HYDROcodone-acetaminophen 7.5-325 MG tablet Commonly known as: NORCO Take 1 tablet by mouth every 6 (six) hours as needed for moderate pain ((score 4 to 6)).   Lumigan 0.01 % Soln Generic drug: bimatoprost Place 1 drop into both eyes at bedtime.   meclizine 25 MG tablet Commonly known as: ANTIVERT Take 1 tablet (25 mg total) by mouth 3 (three) times daily as needed for dizziness.   methocarbamol 500 MG tablet Commonly known as: ROBAXIN Take 1 tablet (500 mg total) by mouth every 6 (six) hours as needed for muscle spasms.   nabumetone 750 MG tablet Commonly known as: RELAFEN Take 750 mg by mouth 2 (two) times daily.   omeprazole 20 MG capsule Commonly known as: PRILOSEC Take 20 mg by mouth daily.   pravastatin 20 MG tablet Commonly known as: PRAVACHOL Take 20 mg by mouth at bedtime.   venlafaxine XR 150 MG 24 hr capsule Commonly known as: EFFEXOR-XR Take 1 capsule (150 mg total) by mouth daily with breakfast.        Diagnostic Studies: DG Chest 2 View  Result Date: 04/22/2021 CLINICAL DATA:  Preoperative evaluation. EXAM: CHEST - 2 VIEW COMPARISON:  Multiple chest XRs, most recently 01/28/2020. CT chest, 10/13/2017. FINDINGS: Cardiomediastinal silhouette is within normal  limits. Lungs are well inflated. Middle lobe and RIGHT perihilar streaky opacities. No focal consolidation or mass. No pleural effusion or pneumothorax. No acute displaced fracture. IMPRESSION: Mild RIGHT lower lobe atelectasis without acute superimposed cardiopulmonary process. Electronically Signed   By: Michaelle Birks M.D.   On: 04/22/2021 09:20   DG Lumbar Spine 2-3 Views  Result Date: 04/26/2021 CLINICAL DATA:  Central laminectomy at lumbar 2-3 and lumbar 3-4 with bilateral foraminotomies. EXAM: LUMBAR SPINE - 2-3 VIEW COMPARISON:  MRI lumbar spine 02/11/2021. FINDINGS: On the first image there are surgical probes posterior to the L2-3 disc space and the L4-5 disc space. On the second image there is a surgical probe in the projection of the L3 spinous process and a surgical probe in the projection of the L4 spinous process. IMPRESSION: Surgical probes are positioned as described above. Electronically Signed   By: Kerby Moors M.D.   On: 04/26/2021 10:31    Discharge Instructions     Call MD / Call 911   Complete by: As directed    If  you experience chest pain or shortness of breath, CALL 911 and be transported to the hospital emergency room.  If you develope a fever above 101 F, pus (white drainage) or increased drainage or redness at the wound, or calf pain, call your surgeon's office.   Constipation Prevention   Complete by: As directed    Drink plenty of fluids.  Prune juice may be helpful.  You may use a stool softener, such as Colace (over the counter) 100 mg twice a day.  Use MiraLax (over the counter) for constipation as needed.   Diet - low sodium heart healthy   Complete by: As directed    Discharge instructions   Complete by: As directed    No lifting greater than 10 lbs. Avoid bending, stooping and twisting. Walk in house for first week them may start to get out slowly increasing distance up to one quarter mile by 3 weeks post op. Keep incision dry for 3 days, may use tegaderm or  similar water impervious dressing.   Driving restrictions   Complete by: As directed    No driving for 4 weeks   Increase activity slowly as tolerated   Complete by: As directed    Lifting restrictions   Complete by: As directed    No lifting for 6 weeks   Post-operative opioid taper instructions:   Complete by: As directed    POST-OPERATIVE OPIOID TAPER INSTRUCTIONS: It is important to wean off of your opioid medication as soon as possible. If you do not need pain medication after your surgery it is ok to stop day one. Opioids include: Codeine, Hydrocodone(Norco, Vicodin), Oxycodone(Percocet, oxycontin) and hydromorphone amongst others.  Long term and even short term use of opiods can cause: Increased pain response Dependence Constipation Depression Respiratory depression And more.  Withdrawal symptoms can include Flu like symptoms Nausea, vomiting And more Techniques to manage these symptoms Hydrate well Eat regular healthy meals Stay active Use relaxation techniques(deep breathing, meditating, yoga) Do Not substitute Alcohol to help with tapering If you have been on opioids for less than two weeks and do not have pain than it is ok to stop all together.  Plan to wean off of opioids This plan should start within one week post op of your joint replacement. Maintain the same interval or time between taking each dose and first decrease the dose.  Cut the total daily intake of opioids by one tablet each day Next start to increase the time between doses. The last dose that should be eliminated is the evening dose.           Follow-up Information     Jessy Oto, MD Follow up in 2 week(s).   Specialty: Orthopedic Surgery Why: For wound re-check Contact information: [place] [address] [phone]                 Discharge Plan:  discharge to home  Disposition:     Signed: Benjiman Core  05/10/2021, 10:52 AM

## 2021-05-17 ENCOUNTER — Telehealth: Payer: Self-pay

## 2021-05-17 MED ORDER — AMOXICILLIN 500 MG PO TABS: ORAL_TABLET | ORAL | 2 refills | Status: DC

## 2021-05-17 NOTE — Telephone Encounter (Signed)
IC advised sent

## 2021-05-17 NOTE — Telephone Encounter (Signed)
Patient is going to the dentist and needs a Rx sent in for antibiotics. Please advise

## 2021-05-17 NOTE — Addendum Note (Signed)
Addended byLaurann Montana on: 05/17/2021 10:58 AM   Modules accepted: Orders

## 2021-05-31 DIAGNOSIS — Z1231 Encounter for screening mammogram for malignant neoplasm of breast: Secondary | ICD-10-CM | POA: Diagnosis not present

## 2021-05-31 DIAGNOSIS — I10 Essential (primary) hypertension: Secondary | ICD-10-CM | POA: Diagnosis not present

## 2021-05-31 DIAGNOSIS — M8580 Other specified disorders of bone density and structure, unspecified site: Secondary | ICD-10-CM | POA: Diagnosis not present

## 2021-05-31 DIAGNOSIS — Z79899 Other long term (current) drug therapy: Secondary | ICD-10-CM | POA: Diagnosis not present

## 2021-05-31 DIAGNOSIS — E785 Hyperlipidemia, unspecified: Secondary | ICD-10-CM | POA: Diagnosis not present

## 2021-05-31 DIAGNOSIS — F411 Generalized anxiety disorder: Secondary | ICD-10-CM | POA: Diagnosis not present

## 2021-05-31 DIAGNOSIS — Z6832 Body mass index (BMI) 32.0-32.9, adult: Secondary | ICD-10-CM | POA: Diagnosis not present

## 2021-05-31 DIAGNOSIS — R7301 Impaired fasting glucose: Secondary | ICD-10-CM | POA: Diagnosis not present

## 2021-05-31 DIAGNOSIS — F325 Major depressive disorder, single episode, in full remission: Secondary | ICD-10-CM | POA: Diagnosis not present

## 2021-05-31 DIAGNOSIS — M1990 Unspecified osteoarthritis, unspecified site: Secondary | ICD-10-CM | POA: Diagnosis not present

## 2021-05-31 DIAGNOSIS — K219 Gastro-esophageal reflux disease without esophagitis: Secondary | ICD-10-CM | POA: Diagnosis not present

## 2021-06-10 ENCOUNTER — Other Ambulatory Visit: Payer: Self-pay

## 2021-06-10 ENCOUNTER — Ambulatory Visit (INDEPENDENT_AMBULATORY_CARE_PROVIDER_SITE_OTHER): Payer: PPO | Admitting: Surgery

## 2021-06-10 ENCOUNTER — Encounter: Payer: Self-pay | Admitting: Surgery

## 2021-06-10 VITALS — BP 162/82 | HR 67 | Ht 61.0 in | Wt 175.0 lb

## 2021-06-10 DIAGNOSIS — Z9889 Other specified postprocedural states: Secondary | ICD-10-CM

## 2021-06-10 NOTE — Progress Notes (Signed)
81 year old white female who is about 6 weeks status post L2-3 and L3-4 decompression returns.  States that she is about 60% better from her preop symptoms.  She is pleased at this point.  Still has pain in her lower back but again much improved.  She wants to know when she can return back to her normal activities which include doing some yard work and things around the house. ? ? ?Exam ?Very pleasant elderly white female alert and oriented in no acute distress.  Gait is normal.  Negative logroll bilateral hips.  Negative straight leg raise.  No focal motor deficits. ? ? ?Plan ?Advised patient to gradually increase walking distances.  She must avoid excessive bending, twisting, heavy lifting.  Follow-up in 5 weeks with Dr. Louanne Skye possibly for her final postop check.  All questions answered. ?

## 2021-06-25 DIAGNOSIS — Z1231 Encounter for screening mammogram for malignant neoplasm of breast: Secondary | ICD-10-CM | POA: Diagnosis not present

## 2021-07-15 ENCOUNTER — Ambulatory Visit (INDEPENDENT_AMBULATORY_CARE_PROVIDER_SITE_OTHER): Payer: PPO | Admitting: Specialist

## 2021-07-15 ENCOUNTER — Encounter: Payer: Self-pay | Admitting: Specialist

## 2021-07-15 VITALS — BP 149/75 | HR 69 | Ht 61.0 in | Wt 175.0 lb

## 2021-07-15 DIAGNOSIS — Z9889 Other specified postprocedural states: Secondary | ICD-10-CM

## 2021-07-15 DIAGNOSIS — M47816 Spondylosis without myelopathy or radiculopathy, lumbar region: Secondary | ICD-10-CM | POA: Diagnosis not present

## 2021-07-15 MED ORDER — TRAMADOL-ACETAMINOPHEN 37.5-325 MG PO TABS
1.0000 | ORAL_TABLET | Freq: Four times a day (QID) | ORAL | 0 refills | Status: DC | PRN
Start: 2021-07-15 — End: 2022-08-01

## 2021-07-15 NOTE — Progress Notes (Signed)
? ?Post-Op Visit Note ?  ?Patient: Cassandra Brooks           ?Date of Birth: 11-Jun-1940           ?MRN: [id number] ?Visit Date: 07/15/2021 ?PCP: Lowella Dandy, NP ? ? ?Assessment & Plan:11.5 weeks post op lumbar laminectomy, She has ? ?Chief Complaint:  ?Chief Complaint  ?Patient presents with  ? Lower Back - Routine Post Op  ?Healed incision  ?Pain mainly in the lumbar area right L3-4 level ?Motor is normal ?She has pain with ROM and any attempts at lifting and bending and twisting increases the pain.  ? ?Visit Diagnoses: No diagnosis found. ? ?Plan: Avoid bending, stooping and avoid lifting weights greater than 10 lbs. ?Avoid prolong standing and walking. ?Avoid frequent bending and stooping  ?No lifting greater than 10 lbs. ?May use ice or moist heat for pain. ?Weight loss is of benefit. ?Handicap license is approved. ?Will ask for [facility] for an assessment and an attempt at improving her endurance and core motor for dynamic stablization.  ? ?Follow-Up Instructions: No follow-ups on file.  ? ?Orders:  ?No orders of the defined types were placed in this encounter. ? ?No orders of the defined types were placed in this encounter. ? ? ?Imaging: ?No results found. ? ?PMFS History: ?Patient Active Problem List  ? Diagnosis Date Noted  ? Spinal stenosis, lumbar region with neurogenic claudication 04/26/2021  ? Spinal stenosis of lumbar region with neurogenic claudication 04/26/2021  ? Pre-diabetes   ? Status post total left knee replacement 03/27/2020  ? Essential hypertension 12/19/2019  ? Mixed dyslipidemia 12/19/2019  ? Diabetes mellitus due to underlying condition with unspecified complications ([place]) 25/08/3974  ? Preoperative cardiovascular examination 12/19/2019  ? Status post AAA (abdominal aortic aneurysm) repair 12/19/2019  ? Syncope and collapse   ? Hypertension   ? High cholesterol   ? Glaucoma, both eyes   ? GERD (gastroesophageal reflux disease)   ? Fibromyalgia   ? Depression   ? Bulging lumbar disc    ? Borderline diabetes   ? Arthritis   ? AAA (abdominal aortic aneurysm) ([place])   ? Primary osteoarthritis of left knee 07/18/2019  ? Disequilibrium   ? Syncope 03/21/2017  ? Dizziness   ? Fall   ? Hemoptysis 03/18/2015  ? Solitary pulmonary nodule 03/18/2015  ? Nuclear cataract of both eyes 01/01/2014  ? ?Past Medical History:  ?Diagnosis Date  ? AAA (abdominal aortic aneurysm) ([place])   ? Arthritis   ? "knees" (03/21/2017)  ? Borderline diabetes   ? Bulging lumbar disc   ? Depression   ? Disequilibrium   ? Dizziness   ? Essential hypertension 12/19/2019  ? Fall   ? Fibromyalgia   ? GERD (gastroesophageal reflux disease)   ? Glaucoma, both eyes   ? Hemoptysis 03/18/2015  ? High cholesterol   ? "don't have it but I take RX" (03/21/2017)  ? Hypertension   ? Mixed dyslipidemia 12/19/2019  ? Nuclear cataract of both eyes 01/01/2014  ? Pre-diabetes   ? pt states she is not diabetic. Last A1C was normal. Pt is on no medications for DM  ? Preoperative cardiovascular examination 12/19/2019  ? Primary osteoarthritis of left knee 07/18/2019  ? Solitary pulmonary nodule 03/18/2015  ? 03/2015 26m RLL nodule   ? Status post AAA (abdominal aortic aneurysm) repair 12/19/2019  ? Status post total left knee replacement 03/27/2020  ? Syncope 03/21/2017  ? Syncope and collapse   ?  ?  Family History  ?Problem Relation Age of Onset  ? Hypertension Father   ? Stomach cancer Father   ? Glaucoma Mother   ? Diabetes Mother   ? Diabetes Sister   ? Diabetes Brother   ? Lupus Child   ? Diabetes Brother   ? Lung cancer Brother   ? Colon cancer Brother   ?  ?Past Surgical History:  ?Procedure Laterality Date  ? ABDOMINAL AORTIC ANEURYSM REPAIR  1966  ? APPENDECTOMY  1966  ? CARDIAC CATHETERIZATION    ? 20+ years ago, no abnormalities found  ? COLONOSCOPY    ? LUMBAR LAMINECTOMY/DECOMPRESSION MICRODISCECTOMY N/A 04/26/2021  ? Procedure: CENTRAL LAMINECTOMIES LUMBAR TWO-THREE,  LUMBAR THREE-FOUR WITH BILATERAL FORAMINOTOMIES;  Surgeon: Jessy Oto,  MD;  Location: [place];  Service: Orthopedics;  Laterality: N/A;  ? TOTAL KNEE ARTHROPLASTY Left 03/27/2020  ? Procedure: LEFT TOTAL KNEE ARTHROPLASTY;  Surgeon: Leandrew Koyanagi, MD;  Location: [place];  Service: Orthopedics;  Laterality: Left;  ? VAGINAL HYSTERECTOMY    ? "partial"  ? VIDEO BRONCHOSCOPY Bilateral 03/19/2015  ? Procedure: VIDEO BRONCHOSCOPY WITHOUT FLUORO;  Surgeon: Rigoberto Noel, MD;  Location: [facility];  Service: Cardiopulmonary;  Laterality: Bilateral;  ? ?Social History  ? ?Occupational History  ? Occupation: retired  ?  Comment: textiles; dietician  ?Tobacco Use  ? Smoking status: Never  ? Smokeless tobacco: Never  ?Vaping Use  ? Vaping Use: Never used  ?Substance and Sexual Activity  ? Alcohol use: No  ? Drug use: No  ? Sexual activity: Not on file  ? ? ? ?

## 2021-07-26 DIAGNOSIS — H401133 Primary open-angle glaucoma, bilateral, severe stage: Secondary | ICD-10-CM | POA: Diagnosis not present

## 2021-09-03 ENCOUNTER — Telehealth: Payer: Self-pay | Admitting: Physical Medicine and Rehabilitation

## 2021-09-03 NOTE — Telephone Encounter (Signed)
Pt called for an appt for back injection. Please call pt's husband  Ozzie to set appt at [phone]

## 2021-09-13 ENCOUNTER — Telehealth: Payer: Self-pay | Admitting: Physical Medicine and Rehabilitation

## 2021-09-13 NOTE — Telephone Encounter (Signed)
Patient's husband returned call asked for call back to schedule an appointment for the patient . The number to contact patient or Cassandra Brooks is   [phone]

## 2021-09-21 DIAGNOSIS — I10 Essential (primary) hypertension: Secondary | ICD-10-CM | POA: Diagnosis not present

## 2021-09-21 DIAGNOSIS — R7301 Impaired fasting glucose: Secondary | ICD-10-CM | POA: Diagnosis not present

## 2021-09-21 DIAGNOSIS — I700 Atherosclerosis of aorta: Secondary | ICD-10-CM | POA: Diagnosis not present

## 2021-09-21 DIAGNOSIS — M8580 Other specified disorders of bone density and structure, unspecified site: Secondary | ICD-10-CM | POA: Diagnosis not present

## 2021-09-21 DIAGNOSIS — F411 Generalized anxiety disorder: Secondary | ICD-10-CM | POA: Diagnosis not present

## 2021-09-21 DIAGNOSIS — F325 Major depressive disorder, single episode, in full remission: Secondary | ICD-10-CM | POA: Diagnosis not present

## 2021-09-21 DIAGNOSIS — E785 Hyperlipidemia, unspecified: Secondary | ICD-10-CM | POA: Diagnosis not present

## 2021-09-21 DIAGNOSIS — K219 Gastro-esophageal reflux disease without esophagitis: Secondary | ICD-10-CM | POA: Diagnosis not present

## 2021-09-21 DIAGNOSIS — M1990 Unspecified osteoarthritis, unspecified site: Secondary | ICD-10-CM | POA: Diagnosis not present

## 2021-09-21 DIAGNOSIS — Z79899 Other long term (current) drug therapy: Secondary | ICD-10-CM | POA: Diagnosis not present

## 2021-10-06 ENCOUNTER — Ambulatory Visit: Payer: PPO | Admitting: Physical Medicine and Rehabilitation

## 2021-10-06 ENCOUNTER — Encounter: Payer: Self-pay | Admitting: Physical Medicine and Rehabilitation

## 2021-10-06 ENCOUNTER — Ambulatory Visit: Payer: Self-pay

## 2021-10-06 VITALS — BP 138/73 | HR 87

## 2021-10-06 DIAGNOSIS — M47816 Spondylosis without myelopathy or radiculopathy, lumbar region: Secondary | ICD-10-CM

## 2021-10-06 DIAGNOSIS — M5416 Radiculopathy, lumbar region: Secondary | ICD-10-CM

## 2021-10-06 DIAGNOSIS — M48062 Spinal stenosis, lumbar region with neurogenic claudication: Secondary | ICD-10-CM

## 2021-10-06 MED ORDER — METHYLPREDNISOLONE ACETATE 80 MG/ML IJ SUSP: 80.0000 mg | Freq: Once | INTRAMUSCULAR | Status: DC

## 2021-10-06 NOTE — Patient Instructions (Signed)

## 2021-10-06 NOTE — Progress Notes (Signed)
Pt state lower back pain that travels down her right leg and ankle. Pt state walking, standing and bending makes the pain worse. Pt state she takes over the counter pain meds to help ease his pain.  Numeric Pain Rating Scale and Functional Assessment Average Pain 8   In the last MONTH (on 0-10 scale) has pain interfered with the following?  1. General activity like being  able to carry out your everyday physical activities such as walking, climbing stairs, carrying groceries, or moving a chair?  Rating(10)   +Driver, -BT, -Dye Allergies.

## 2021-10-14 ENCOUNTER — Encounter: Payer: Self-pay | Admitting: Physical Medicine and Rehabilitation

## 2021-10-19 ENCOUNTER — Ambulatory Visit: Payer: PPO | Admitting: Orthopaedic Surgery

## 2021-10-19 ENCOUNTER — Ambulatory Visit (INDEPENDENT_AMBULATORY_CARE_PROVIDER_SITE_OTHER): Payer: PPO

## 2021-10-19 DIAGNOSIS — M79671 Pain in right foot: Secondary | ICD-10-CM | POA: Diagnosis not present

## 2021-10-19 MED ORDER — DICLOFENAC SODIUM 1 % EX GEL: 2.0000 g | Freq: Four times a day (QID) | CUTANEOUS | 2 refills | Status: DC

## 2021-10-19 MED ORDER — DICLOFENAC SODIUM 75 MG PO TBEC: 75.0000 mg | DELAYED_RELEASE_TABLET | Freq: Two times a day (BID) | ORAL | 2 refills | Status: DC | PRN

## 2021-10-19 NOTE — Progress Notes (Unsigned)
Office Visit Note   Patient: Cassandra Brooks           Date of Birth: [birth year]           MRN: [id number] Visit Date: 10/19/2021              Requested by: Lowella Dandy, NP [place],  [place] [address] PCP: Lowella Dandy, NP   Assessment & Plan: Visit Diagnoses:  1. Pain in right foot     Plan: Impression is chronic right foot/ankle pain.  It is hard to tell whether the patient's symptoms are coming from her underlying arthritis or from posterior tibial and peroneal tendinitis.  I would like to place her in a cam walker for the next few weeks.  I will also like to start her on an oral and topical anti-inflammatory.  She is agreeable to this plan.  If her symptoms fail to improve over the next several weeks she will let us know.  Otherwise, wean out of the boot as tolerated and follow-up as needed.  Follow-Up Instructions: Return if symptoms worsen or fail to improve.   Orders:  Orders Placed This Encounter  Procedures   XR Foot Complete Right   XR Ankle Complete Right   No orders of the defined types were placed in this encounter.     Procedures: No procedures performed   Clinical Data: No additional findings.   Subjective: Chief Complaint  Patient presents with   Right Ankle - Pain   Right Foot - Pain    HPI patient is a pleasant 81 year old female who comes in today with right foot/ankle pain for the past 3 years.  She did have an injury prior to the onset of pain where she dropped a can on her foot.  She has since had pain to the dorsum of the foot as well as the lateral and medial ankle.  Pain is constant with associated stiffness.  Symptoms are worse with walking as well as inversion of the ankle.  She has been taking tramadol and Tylenol for which she takes for her back without any relief in the foot pain.  Review of Systems as detailed in HPI.  All others reviewed and are negative.   Objective: Vital Signs: There were no vitals taken  for this visit.  Physical Exam well-developed well-nourished female no acute distress.  Alert and oriented x3.  Ortho Exam right foot exam shows no swelling.  Moderate tenderness along the posterior tibial and peroneal tendons.  She has mild and diffuse tenderness across the dorsum of the midfoot.  She has increased pain with inversion of the ankle.  She is neurovascular intact distally.  Specialty Comments:  MRI LUMBAR SPINE WITHOUT CONTRAST   TECHNIQUE: Multiplanar, multisequence MR imaging of the lumbar spine was performed. No intravenous contrast was administered.   COMPARISON:  01/11/2020   FINDINGS: Segmentation:  5 lumbar type vertebral bodies.   Alignment:  Slight increase in mild retrolisthesis of L2 on L3.   Vertebrae: No acute fracture or suspicious osseous lesion. Interval development of Modic type 1 endplate degenerative changes at L2-L3 and L3-L4   Conus medullaris and cauda equina: Conus extends to the L2 level. Conus and cauda equina appear normal.   Paraspinal and other soft tissues: Negative.   Disc levels:   T12-L1: No significant disc bulge. No spinal canal stenosis or neural foraminal narrowing.   L1-L2: No significant disc bulge. No spinal canal stenosis or  neural foraminal narrowing.   L2-L3: Progressive disc height loss with increased broad-based disc bulge, which narrows the lateral recesses. Mild-to-moderate facet arthropathy and ligamentum flavum hypertrophy. Moderate spinal canal stenosis, which has progressed from the prior exam. Mild left-greater-than-right neural foraminal narrowing.   L3-L4: Progressive disc height loss and increased disc bulge, which effaces the lateral recesses. Moderate to severe facet arthropathy and ligamentum flavum hypertrophy. Moderate to severe spinal canal stenosis, which has progressed from the prior exam. Mild-to-moderate left and mild right neural foraminal narrowing.   L4-L5: Disc osteophyte complex, left  greater than right, unchanged. Narrowing of the lateral recesses. Mild facet arthropathy. No spinal canal stenosis. Moderate bilateral neural foraminal narrowing.   L5-S1: Mild disc bulge. Mild facet arthropathy. No spinal canal stenosis. Mild right and moderate left neural foraminal narrowing.   IMPRESSION: 1. L3-L4 moderate to severe spinal canal stenosis, which has progressed from the prior exam, with mild to moderate left and mild right neural foraminal narrowing. Effacement of the lateral recesses at this level likely compresses the descending L4 nerves. 2. L2-L3 moderate spinal canal stenosis, which has progressed from the prior exam, with mild left-greater-than-right neural foraminal narrowing. Narrowing of the lateral recesses at this level may affect the descending L3 nerves. 3. L4-L5 moderate bilateral neural foraminal narrowing, unchanged. 4. L5-S1 moderate left and mild right neural foraminal narrowing. 5. Progressive disc height loss L2-L4, with Modic type 1 endplate degenerative changes, which can also cause pain.     Electronically Signed   By: Merilyn Baba M.D.   On: 02/12/2021 11:41  Imaging: XR Foot Complete Right  Result Date: 10/19/2021 Picture consistent state moderate talonavicular joint arthritis with tarsal bossing  XR Ankle Complete Right  Result Date: 10/19/2021 Mild degenerative changes    PMFS History: Patient Active Problem List   Diagnosis Date Noted   Spinal stenosis, lumbar region with neurogenic claudication 04/26/2021   Spinal stenosis of lumbar region with neurogenic claudication 04/26/2021   Pre-diabetes    Status post total left knee replacement 03/27/2020   Essential hypertension 12/19/2019   Mixed dyslipidemia 12/19/2019   Diabetes mellitus due to underlying condition with unspecified complications ([place]) 60/01/9322   Preoperative cardiovascular examination 12/19/2019   Status post AAA (abdominal aortic aneurysm) repair 12/19/2019    Syncope and collapse    Hypertension    High cholesterol    Glaucoma, both eyes    GERD (gastroesophageal reflux disease)    Fibromyalgia    Depression    Bulging lumbar disc    Borderline diabetes    Arthritis    AAA (abdominal aortic aneurysm) ([place])    Primary osteoarthritis of left knee 07/18/2019   Disequilibrium    Syncope 03/21/2017   Dizziness    Fall    Hemoptysis 03/18/2015   Solitary pulmonary nodule 03/18/2015   Nuclear cataract of both eyes 01/01/2014   Past Medical History:  Diagnosis Date   AAA (abdominal aortic aneurysm) ([place])    Arthritis    "knees" (03/21/2017)   Borderline diabetes    Bulging lumbar disc    Depression    Disequilibrium    Dizziness    Essential hypertension 12/19/2019   Fall    Fibromyalgia    GERD (gastroesophageal reflux disease)    Glaucoma, both eyes    Hemoptysis 03/18/2015   High cholesterol    "don't have it but I take RX" (03/21/2017)   Hypertension    Mixed dyslipidemia 12/19/2019   Nuclear cataract of both eyes 01/01/2014  Pre-diabetes    pt states she is not diabetic. Last A1C was normal. Pt is on no medications for DM   Preoperative cardiovascular examination 12/19/2019   Primary osteoarthritis of left knee 07/18/2019   Solitary pulmonary nodule 03/18/2015   03/2015 45m RLL nodule    Status post AAA (abdominal aortic aneurysm) repair 12/19/2019   Status post total left knee replacement 03/27/2020   Syncope 03/21/2017   Syncope and collapse     Family History  Problem Relation Age of Onset   Hypertension Father    Stomach cancer Father    Glaucoma Mother    Diabetes Mother    Diabetes Sister    Diabetes Brother    Lupus Child    Diabetes Brother    Lung cancer Brother    Colon cancer Brother     Past Surgical History:  Procedure Laterality Date   ABDOMINAL AORTIC ANEURYSM [facility]    20+ years ago, no abnormalities found   COLONOSCOPY     LUMBAR  LAMINECTOMY/DECOMPRESSION MICRODISCECTOMY N/A 04/26/2021   Procedure: CENTRAL LAMINECTOMIES LUMBAR TWO-THREE,  LUMBAR THREE-FOUR WITH BILATERAL FORAMINOTOMIES;  Surgeon: NJessy Oto MD;  [facility]  Service: Orthopedics;  Laterality: N/A;   TOTAL KNEE ARTHROPLASTY Left 03/27/2020   Procedure: LEFT TOTAL KNEE ARTHROPLASTY;  Surgeon: XLeandrew Koyanagi MD;  [facility]  Service: Orthopedics;  Laterality: Left;   VAGINAL HYSTERECTOMY     "partial"   VIDEO BRONCHOSCOPY Bilateral 03/19/2015   Procedure: VIDEO BRONCHOSCOPY WITHOUT FLUORO;  Surgeon: RRigoberto Noel MD;  Location: [facility];  Service: Cardiopulmonary;  Laterality: Bilateral;   Social History   Occupational History   Occupation: retired    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

## 2021-11-08 NOTE — Progress Notes (Signed)
Makenlee Mckeag - 81 y.o. female MRN [id number]  Date of birth: 04-23-1940  Office Visit Note: Visit Date: 10/06/2021 PCP: Lowella Dandy, NP Referred by: Magnus Sinning, MD  Subjective: Chief Complaint  Patient presents with   Lower Back - Pain   Right Leg - Pain   HPI:  Aki Burdin is a 81 y.o. female who comes in today at the request of Dr. Basil Dess for planned Bilateral  L4-5 and L5-S1 Lumbar facet/medial branch block with fluoroscopic guidance.  The patient has failed conservative care including home exercise, medications, time and activity modification.  This injection will be diagnostic and hopefully therapeutic.  Please see requesting physician notes for further details and justification.  Exam has shown concordant pain with facet joint loading.   ROS Otherwise per HPI.  Assessment & Plan: Visit Diagnoses:    ICD-10-CM   1. Spondylosis without myelopathy or radiculopathy, lumbar region  M47.816 Facet Injection    2. Lumbar radiculopathy  M54.16 XR C-ARM NO REPORT    DISCONTINUED: methylPREDNISolone acetate (DEPO-MEDROL) injection 80 mg    CANCELED: Epidural Steroid injection    3. Spinal stenosis of lumbar region with neurogenic claudication  M48.062 XR C-ARM NO REPORT    DISCONTINUED: methylPREDNISolone acetate (DEPO-MEDROL) injection 80 mg    CANCELED: Epidural Steroid injection      Plan: No additional findings.   Meds & Orders:  Meds ordered this encounter  Medications   DISCONTD: methylPREDNISolone acetate (DEPO-MEDROL) injection 80 mg    Orders Placed This Encounter  Procedures   Facet Injection   XR C-ARM NO REPORT    Follow-up: Return for Review Pain Diary.   Procedures: No procedures performed  Lumbar Diagnostic Facet Joint Nerve Block with Fluoroscopic Guidance   Patient: Shakiya Mcneary      Date of Birth: 11-Mar-1941 MRN: [id number] PCP: Lowella Dandy, NP      Visit Date: 10/06/2021   Universal Protocol:    Date/Time: 11/08/2308:46  PM  Consent Given By: the patient  Position: PRONE  Additional Comments: Vital signs were monitored before and after the procedure. Patient was prepped and draped in the usual sterile fashion. The correct patient, procedure, and site was verified.   Injection Procedure Details:   Procedure diagnoses:  1. Spondylosis without myelopathy or radiculopathy, lumbar region   2. Lumbar radiculopathy   3. Spinal stenosis of lumbar region with neurogenic claudication      Meds Administered:  Meds ordered this encounter  Medications   methylPREDNISolone acetate (DEPO-MEDROL) injection 80 mg     Laterality: Bilateral  Location/Site: L4-L5, L3 and L4 medial branches and L5-S1, L4 medial branch and L5 dorsal ramus  Needle: 5.0 in., 25 ga.  Short bevel or Quincke spinal needle  Needle Placement: Oblique pedical  Findings:   -Comments: There was excellent flow of contrast along the articular pillars without intravascular flow.  Procedure Details: The fluoroscope beam is vertically oriented in AP and then obliqued 15 to 20 degrees to the ipsilateral side of the desired nerve to achieve the "Scotty dog" appearance.  The skin over the target area of the junction of the superior articulating process and the transverse process (sacral ala if blocking the L5 dorsal rami) was locally anesthetized with a 1 ml volume of 1% Lidocaine without Epinephrine.  The spinal needle was inserted and advanced in a trajectory view down to the target.   After contact with periosteum and negative aspirate for blood and CSF, correct placement without intravascular or  epidural spread was confirmed by injecting 0.5 ml. of Isovue-250.  A spot radiograph was obtained of this image.    Next, a 0.5 ml. volume of the injectate described above was injected. The needle was then redirected to the other facet joint nerves mentioned above if needed.  Prior to the procedure, the patient was given a Pain Diary which was  completed for baseline measurements.  After the procedure, the patient rated their pain every 30 minutes and will continue rating at this frequency for a total of 5 hours.  The patient has been asked to complete the Diary and return to [place] by mail, fax or hand delivered as soon as possible.   Additional Comments:  The patient tolerated the procedure well Dressing: 2 x 2 sterile gauze and Band-Aid    Post-procedure details: Patient was observed during the procedure. Post-procedure instructions were reviewed.  Patient left the clinic in stable condition.   Clinical History: MRI LUMBAR SPINE WITHOUT CONTRAST   TECHNIQUE: Multiplanar, multisequence MR imaging of the lumbar spine was performed. No intravenous contrast was administered.   COMPARISON:  01/11/2020   FINDINGS: Segmentation:  5 lumbar type vertebral bodies.   Alignment:  Slight increase in mild retrolisthesis of L2 on L3.   Vertebrae: No acute fracture or suspicious osseous lesion. Interval development of Modic type 1 endplate degenerative changes at L2-L3 and L3-L4   Conus medullaris and cauda equina: Conus extends to the L2 level. Conus and cauda equina appear normal.   Paraspinal and other soft tissues: Negative.   Disc levels:   T12-L1: No significant disc bulge. No spinal canal stenosis or neural foraminal narrowing.   L1-L2: No significant disc bulge. No spinal canal stenosis or neural foraminal narrowing.   L2-L3: Progressive disc height loss with increased broad-based disc bulge, which narrows the lateral recesses. Mild-to-moderate facet arthropathy and ligamentum flavum hypertrophy. Moderate spinal canal stenosis, which has progressed from the prior exam. Mild left-greater-than-right neural foraminal narrowing.   L3-L4: Progressive disc height loss and increased disc bulge, which effaces the lateral recesses. Moderate to severe facet arthropathy and ligamentum flavum hypertrophy. Moderate to severe  spinal canal stenosis, which has progressed from the prior exam. Mild-to-moderate left and mild right neural foraminal narrowing.   L4-L5: Disc osteophyte complex, left greater than right, unchanged. Narrowing of the lateral recesses. Mild facet arthropathy. No spinal canal stenosis. Moderate bilateral neural foraminal narrowing.   L5-S1: Mild disc bulge. Mild facet arthropathy. No spinal canal stenosis. Mild right and moderate left neural foraminal narrowing.   IMPRESSION: 1. L3-L4 moderate to severe spinal canal stenosis, which has progressed from the prior exam, with mild to moderate left and mild right neural foraminal narrowing. Effacement of the lateral recesses at this level likely compresses the descending L4 nerves. 2. L2-L3 moderate spinal canal stenosis, which has progressed from the prior exam, with mild left-greater-than-right neural foraminal narrowing. Narrowing of the lateral recesses at this level may affect the descending L3 nerves. 3. L4-L5 moderate bilateral neural foraminal narrowing, unchanged. 4. L5-S1 moderate left and mild right neural foraminal narrowing. 5. Progressive disc height loss L2-L4, with Modic type 1 endplate degenerative changes, which can also cause pain.     Electronically Signed   By: Merilyn Baba M.D.   On: 02/12/2021 11:41     Objective:  VS:  HT:    WT:   BMI:     BP:138/73  HR:87bpm  TEMP: ( )  RESP:  Physical Exam Vitals and nursing note  reviewed.  Constitutional:      General: She is not in acute distress.    Appearance: Normal appearance. She is not ill-appearing.  HENT:     Head: Normocephalic and atraumatic.     Right Ear: External ear normal.     Left Ear: External ear normal.  Eyes:     Extraocular Movements: Extraocular movements intact.  Cardiovascular:     Rate and Rhythm: Normal rate.     Pulses: Normal pulses.  Pulmonary:     Effort: Pulmonary effort is normal. No respiratory distress.  Abdominal:      General: There is no distension.     Palpations: Abdomen is soft.  Musculoskeletal:        General: Tenderness present.     Cervical back: Neck supple.     Right lower leg: No edema.     Left lower leg: No edema.     Comments: Patient has good distal strength with no pain over the greater trochanters.  No clonus or focal weakness.  Skin:    Findings: No erythema, lesion or rash.  Neurological:     General: No focal deficit present.     Mental Status: She is alert and oriented to person, place, and time.     Sensory: No sensory deficit.     Motor: No weakness or abnormal muscle tone.     Coordination: Coordination normal.  Psychiatric:        Mood and Affect: Mood normal.        Behavior: Behavior normal.      Imaging: No results found.

## 2021-11-08 NOTE — Procedures (Signed)
Lumbar Diagnostic Facet Joint Nerve Block with Fluoroscopic Guidance   Patient: Cassandra Brooks      Date of Birth: September 28, 1940 MRN: [id number] PCP: Lowella Dandy, NP      Visit Date: 10/06/2021   Universal Protocol:    Date/Time: 11/08/2308:46 PM  Consent Given By: the patient  Position: PRONE  Additional Comments: Vital signs were monitored before and after the procedure. Patient was prepped and draped in the usual sterile fashion. The correct patient, procedure, and site was verified.   Injection Procedure Details:   Procedure diagnoses:  1. Spondylosis without myelopathy or radiculopathy, lumbar region   2. Lumbar radiculopathy   3. Spinal stenosis of lumbar region with neurogenic claudication      Meds Administered:  Meds ordered this encounter  Medications   methylPREDNISolone acetate (DEPO-MEDROL) injection 80 mg     Laterality: Bilateral  Location/Site: L4-L5, L3 and L4 medial branches and L5-S1, L4 medial branch and L5 dorsal ramus  Needle: 5.0 in., 25 ga.  Short bevel or Quincke spinal needle  Needle Placement: Oblique pedical  Findings:   -Comments: There was excellent flow of contrast along the articular pillars without intravascular flow.  Procedure Details: The fluoroscope beam is vertically oriented in AP and then obliqued 15 to 20 degrees to the ipsilateral side of the desired nerve to achieve the "Scotty dog" appearance.  The skin over the target area of the junction of the superior articulating process and the transverse process (sacral ala if blocking the L5 dorsal rami) was locally anesthetized with a 1 ml volume of 1% Lidocaine without Epinephrine.  The spinal needle was inserted and advanced in a trajectory view down to the target.   After contact with periosteum and negative aspirate for blood and CSF, correct placement without intravascular or epidural spread was confirmed by injecting 0.5 ml. of Isovue-250.  A spot radiograph was obtained of this  image.    Next, a 0.5 ml. volume of the injectate described above was injected. The needle was then redirected to the other facet joint nerves mentioned above if needed.  Prior to the procedure, the patient was given a Pain Diary which was completed for baseline measurements.  After the procedure, the patient rated their pain every 30 minutes and will continue rating at this frequency for a total of 5 hours.  The patient has been asked to complete the Diary and return to [place] by mail, fax or hand delivered as soon as possible.   Additional Comments:  The patient tolerated the procedure well Dressing: 2 x 2 sterile gauze and Band-Aid    Post-procedure details: Patient was observed during the procedure. Post-procedure instructions were reviewed.  Patient left the clinic in stable condition.

## 2021-11-16 ENCOUNTER — Ambulatory Visit: Payer: PPO | Admitting: Orthopaedic Surgery

## 2021-11-16 DIAGNOSIS — M76821 Posterior tibial tendinitis, right leg: Secondary | ICD-10-CM | POA: Insufficient documentation

## 2021-11-16 HISTORY — DX: Posterior tibial tendinitis, right leg: M76.821

## 2021-11-16 NOTE — Progress Notes (Signed)
Office Visit Note   Patient: Cassandra Brooks           Date of Birth: 11/29/1940           MRN: [id number] Visit Date: 11/16/2021              Requested by: Cassandra Dandy, NP [place],  [place] [address] PCP: Cassandra Dandy, NP   Assessment & Plan: Visit Diagnoses:  1. Posterior tibial tendon dysfunction (PTTD) of right lower extremity     Plan: Impression is right posterior tibial tendon dysfunction.  Treatment options were reviewed and we will continue immobilization with a cam boot.  We will send a referral to [facility] for ultrasound-guided injection of the posterior tibial tendon.  Will continue to immobilize for a few weeks after the injection and then wean the boot as tolerated.  Follow-up if symptoms persist.  Follow-Up Instructions: No follow-ups on file.   Orders:  Orders Placed This Encounter  Procedures   AMB referral to sports medicine   No orders of the defined types were placed in this encounter.     Procedures: No procedures performed   Clinical Data: No additional findings.   Subjective: Chief Complaint  Patient presents with   Right Foot - Pain    HPI Cassandra Brooks returns today for follow-up of right ankle pain.  The boot has helped but she continues to have some posterior medial pain.  Voltaren gel does help as well. Review of Systems   Objective: Vital Signs: There were no vitals taken for this visit.  Physical Exam  Ortho Exam Examination of the right ankle shows no swelling.  Ankle joint and talonavicular joint are nontender.  She is significant tenderness along the posterior tibial tendon proximal to the medial malleolus. Specialty Comments:  MRI LUMBAR SPINE WITHOUT CONTRAST   TECHNIQUE: Multiplanar, multisequence MR imaging of the lumbar spine was performed. No intravenous contrast was administered.   COMPARISON:  01/11/2020   FINDINGS: Segmentation:  5 lumbar type vertebral bodies.    Alignment:  Slight increase in mild retrolisthesis of L2 on L3.   Vertebrae: No acute fracture or suspicious osseous lesion. Interval development of Modic type 1 endplate degenerative changes at L2-L3 and L3-L4   Conus medullaris and cauda equina: Conus extends to the L2 level. Conus and cauda equina appear normal.   Paraspinal and other soft tissues: Negative.   Disc levels:   T12-L1: No significant disc bulge. No spinal canal stenosis or neural foraminal narrowing.   L1-L2: No significant disc bulge. No spinal canal stenosis or neural foraminal narrowing.   L2-L3: Progressive disc height loss with increased broad-based disc bulge, which narrows the lateral recesses. Mild-to-moderate facet arthropathy and ligamentum flavum hypertrophy. Moderate spinal canal stenosis, which has progressed from the prior exam. Mild left-greater-than-right neural foraminal narrowing.   L3-L4: Progressive disc height loss and increased disc bulge, which effaces the lateral recesses. Moderate to severe facet arthropathy and ligamentum flavum hypertrophy. Moderate to severe spinal canal stenosis, which has progressed from the prior exam. Mild-to-moderate left and mild right neural foraminal narrowing.   L4-L5: Disc osteophyte complex, left greater than right, unchanged. Narrowing of the lateral recesses. Mild facet arthropathy. No spinal canal stenosis. Moderate bilateral neural foraminal narrowing.   L5-S1: Mild disc bulge. Mild facet arthropathy. No spinal canal stenosis. Mild right and moderate left neural foraminal narrowing.   IMPRESSION: 1. L3-L4 moderate to severe spinal canal stenosis, which has  progressed from the prior exam, with mild to moderate left and mild right neural foraminal narrowing. Effacement of the lateral recesses at this level likely compresses the descending L4 nerves. 2. L2-L3 moderate spinal canal stenosis, which has progressed from the prior exam, with mild  left-greater-than-right neural foraminal narrowing. Narrowing of the lateral recesses at this level may affect the descending L3 nerves. 3. L4-L5 moderate bilateral neural foraminal narrowing, unchanged. 4. L5-S1 moderate left and mild right neural foraminal narrowing. 5. Progressive disc height loss L2-L4, with Modic type 1 endplate degenerative changes, which can also cause pain.     Electronically Signed   By: Cassandra Brooks M.D.   On: 02/12/2021 11:41  Imaging: No results found.   PMFS History: Patient Active Problem List   Diagnosis Date Noted   Posterior tibial tendon dysfunction (PTTD) of right lower extremity 11/16/2021   Spinal stenosis, lumbar region with neurogenic claudication 04/26/2021   Spinal stenosis of lumbar region with neurogenic claudication 04/26/2021   Pre-diabetes    Status post total left knee replacement 03/27/2020   Essential hypertension 12/19/2019   Mixed dyslipidemia 12/19/2019   Diabetes mellitus due to underlying condition with unspecified complications ([place]) 44/04/270   Preoperative cardiovascular examination 12/19/2019   Status post AAA (abdominal aortic aneurysm) repair 12/19/2019   Syncope and collapse    Hypertension    High cholesterol    Glaucoma, both eyes    GERD (gastroesophageal reflux disease)    Fibromyalgia    Depression    Bulging lumbar disc    Borderline diabetes    Arthritis    AAA (abdominal aortic aneurysm) ([place])    Primary osteoarthritis of left knee 07/18/2019   Disequilibrium    Syncope 03/21/2017   Dizziness    Fall    Hemoptysis 03/18/2015   Solitary pulmonary nodule 03/18/2015   Nuclear cataract of both eyes 01/01/2014   Past Medical History:  Diagnosis Date   AAA (abdominal aortic aneurysm) ([place])    Arthritis    "knees" (03/21/2017)   Borderline diabetes    Bulging lumbar disc    Depression    Disequilibrium    Dizziness    Essential hypertension 12/19/2019   Fall    Fibromyalgia    GERD  (gastroesophageal reflux disease)    Glaucoma, both eyes    Hemoptysis 03/18/2015   High cholesterol    "don't have it but I take RX" (03/21/2017)   Hypertension    Mixed dyslipidemia 12/19/2019   Nuclear cataract of both eyes 01/01/2014   Pre-diabetes    pt states she is not diabetic. Last A1C was normal. Pt is on no medications for DM   Preoperative cardiovascular examination 12/19/2019   Primary osteoarthritis of left knee 07/18/2019   Solitary pulmonary nodule 03/18/2015   03/2015 48m RLL nodule    Status post AAA (abdominal aortic aneurysm) repair 12/19/2019   Status post total left knee replacement 03/27/2020   Syncope 03/21/2017   Syncope and collapse     Family History  Problem Relation Age of Onset   Hypertension Father    Stomach cancer Father    Glaucoma Mother    Diabetes Mother    Diabetes Sister    Diabetes Brother    Lupus Child    Diabetes Brother    Lung cancer Brother    Colon cancer Brother     Past Surgical History:  Procedure Laterality Date   ABDOMINAL AORTIC ANEURYSM [place]  CARDIAC CATHETERIZATION     20+ years ago, no abnormalities found   COLONOSCOPY     LUMBAR LAMINECTOMY/DECOMPRESSION MICRODISCECTOMY N/A 04/26/2021   Procedure: CENTRAL LAMINECTOMIES LUMBAR TWO-THREE,  LUMBAR THREE-FOUR WITH BILATERAL FORAMINOTOMIES;  Surgeon: Jessy Oto, MD;  Location: [facility];  Service: Orthopedics;  Laterality: N/A;   TOTAL KNEE ARTHROPLASTY Left 03/27/2020   Procedure: LEFT TOTAL KNEE ARTHROPLASTY;  Surgeon: Leandrew Koyanagi, MD;  Location: [facility];  Service: Orthopedics;  Laterality: Left;   VAGINAL HYSTERECTOMY     "partial"   VIDEO BRONCHOSCOPY Bilateral 03/19/2015   Procedure: VIDEO BRONCHOSCOPY WITHOUT FLUORO;  Surgeon: Rigoberto Noel, MD;  Location[facility];  Service: Cardiopulmonary;  Laterality: Bilateral;   Social History   Occupational History   Occupation: retired    Comment: Charity fundraiser; dietician  Tobacco Use    Smoking status: Never   Smokeless tobacco: Never  Vaping Use   Vaping Use: Never used  Substance and Sexual Activity   Alcohol use: No   Drug use: No   Sexual activity: Not on file

## 2021-11-18 NOTE — Progress Notes (Signed)
   I, Peterson Lombard, LAT, ATC acting as a scribe for Lynne Leader, MD.  Subjective:    CC: R ankle/foot pain  HPI: Pt is an 81 y/o female c/o R ankle/foot pain. Pt was previously seen by Dr. Erlinda Hong on 10/19/21 & 11/16/21 and pain was thought to be due to underlying arthritis or from posterior tibial and peroneal tendinitis and pt was placed in a CAM walker boot. Pt locates pain to her ankle she has been a boot for 5 weeks is here for a shot , tendinitis, been going on for a couple of yeats   Dx imaging: 10/19/21 R foot & ankle XR  Pertinent review of Systems: No fevers or chills  Relevant historical information: Hypertension.   Objective:    Vitals:   11/22/21 0747  BP: 122/78  Pulse: 84  SpO2: 97%   General: Well Developed, well nourished, and in no acute distress.   MSK: Right foot and ankle slight bossing dorsal midfoot.  Otherwise normal-appearing Normal foot and ankle motion.  Tender palpation along posterior tibialis tendon at posterior to medial malleolus and along dorsal medial midfoot. Normal foot and ankle motion.  Intact strength. Some pain with resisted foot eversion.  Lab and Radiology Results  Procedure: Real-time Ultrasound Guided Injection of right posterior tibialis tendon sheath Device: Philips Affiniti 50G Images permanently stored and available for review in PACS Verbal informed consent obtained.  Discussed risks and benefits of procedure. Warned about infection, bleeding, hyperglycemia damage to structures among others. Patient expresses understanding and agreement Time-out conducted.   Noted no overlying erythema, induration, or other signs of local infection.   Skin prepped in a sterile fashion.   Local anesthesia: Topical Ethyl chloride.   With sterile technique and under real time ultrasound guidance: 40 mg of Kenalog and 1 mL of lidocaine injected into right posterior tibialis tendon sheath. Fluid seen entering the sheath.   Completed without  difficulty   Pain immediately resolved suggesting accurate placement of the medication.   Advised to call if fevers/chills, erythema, induration, drainage, or persistent bleeding.   Images permanently stored and available for review in the ultrasound unit.  Impression: Technically successful ultrasound guided injection.        Impression and Recommendations:    Assessment and Plan: 80 y.o. female with posterior tibialis tenosynovitis.  This is part of her foot and ankle pain.  She also has midfoot arthritis that is contributing to her pain.  Plan for injection today.  Continue CAM Walker boot and wean per Dr Erlinda Hong.  Recommend also Voltaren gel and good arch support as part of the wean.  I could proceed to midfoot injection if needed.  Recheck back as needed.Marland Kitchen  PDMP not reviewed this encounter. Orders Placed This Encounter  Procedures   [place] LIMITED JOINT SPACE STRUCTURES LOW RIGHT(NO LINKED CHARGES)    Order Specific Question:   Reason for Exam (SYMPTOM  OR DIAGNOSIS REQUIRED)    Answer:   right ankle pain    Order Specific Question:   Preferred imaging location?    Answer:   [facility]   No orders of the defined types were placed in this encounter.   Discussed warning signs or symptoms. Please see discharge instructions. Patient expresses understanding.   The above documentation has been reviewed and is accurate and complete Lynne Leader, M.D.

## 2021-11-22 ENCOUNTER — Ambulatory Visit: Payer: PPO | Admitting: Family Medicine

## 2021-11-22 ENCOUNTER — Ambulatory Visit: Payer: Self-pay

## 2021-11-22 VITALS — BP 122/78 | HR 84 | Ht 61.0 in | Wt 175.0 lb

## 2021-11-22 DIAGNOSIS — G8929 Other chronic pain: Secondary | ICD-10-CM | POA: Diagnosis not present

## 2021-11-22 DIAGNOSIS — M7751 Other enthesopathy of right foot: Secondary | ICD-10-CM | POA: Diagnosis not present

## 2021-11-22 DIAGNOSIS — M25571 Pain in right ankle and joints of right foot: Secondary | ICD-10-CM | POA: Diagnosis not present

## 2021-11-22 NOTE — Patient Instructions (Addendum)
Good to see you  Discuss arch supports when you move back to regular shoes with Dr. Erlinda Hong Voltaren gel over the counter  As needed follow up

## 2021-12-17 DIAGNOSIS — R300 Dysuria: Secondary | ICD-10-CM | POA: Diagnosis not present

## 2021-12-17 DIAGNOSIS — N390 Urinary tract infection, site not specified: Secondary | ICD-10-CM | POA: Diagnosis not present

## 2022-01-05 DIAGNOSIS — I10 Essential (primary) hypertension: Secondary | ICD-10-CM | POA: Diagnosis not present

## 2022-01-05 DIAGNOSIS — R350 Frequency of micturition: Secondary | ICD-10-CM | POA: Diagnosis not present

## 2022-01-07 DIAGNOSIS — E785 Hyperlipidemia, unspecified: Secondary | ICD-10-CM | POA: Diagnosis not present

## 2022-01-07 DIAGNOSIS — Z139 Encounter for screening, unspecified: Secondary | ICD-10-CM | POA: Diagnosis not present

## 2022-01-07 DIAGNOSIS — Z9181 History of falling: Secondary | ICD-10-CM | POA: Diagnosis not present

## 2022-01-07 DIAGNOSIS — Z1331 Encounter for screening for depression: Secondary | ICD-10-CM | POA: Diagnosis not present

## 2022-01-07 DIAGNOSIS — Z0000 Encounter for general adult medical examination without abnormal findings: Secondary | ICD-10-CM | POA: Diagnosis not present

## 2022-01-07 DIAGNOSIS — Z6831 Body mass index (BMI) 31.0-31.9, adult: Secondary | ICD-10-CM | POA: Diagnosis not present

## 2022-01-07 DIAGNOSIS — E669 Obesity, unspecified: Secondary | ICD-10-CM | POA: Diagnosis not present

## 2022-01-21 DIAGNOSIS — K219 Gastro-esophageal reflux disease without esophagitis: Secondary | ICD-10-CM | POA: Diagnosis not present

## 2022-01-21 DIAGNOSIS — R7301 Impaired fasting glucose: Secondary | ICD-10-CM | POA: Diagnosis not present

## 2022-01-21 DIAGNOSIS — F411 Generalized anxiety disorder: Secondary | ICD-10-CM | POA: Diagnosis not present

## 2022-01-21 DIAGNOSIS — F325 Major depressive disorder, single episode, in full remission: Secondary | ICD-10-CM | POA: Diagnosis not present

## 2022-01-21 DIAGNOSIS — E785 Hyperlipidemia, unspecified: Secondary | ICD-10-CM | POA: Diagnosis not present

## 2022-01-21 DIAGNOSIS — M8580 Other specified disorders of bone density and structure, unspecified site: Secondary | ICD-10-CM | POA: Diagnosis not present

## 2022-01-21 DIAGNOSIS — A499 Bacterial infection, unspecified: Secondary | ICD-10-CM | POA: Diagnosis not present

## 2022-01-21 DIAGNOSIS — N390 Urinary tract infection, site not specified: Secondary | ICD-10-CM | POA: Diagnosis not present

## 2022-01-21 DIAGNOSIS — Z79899 Other long term (current) drug therapy: Secondary | ICD-10-CM | POA: Diagnosis not present

## 2022-01-21 DIAGNOSIS — M1990 Unspecified osteoarthritis, unspecified site: Secondary | ICD-10-CM | POA: Diagnosis not present

## 2022-01-21 DIAGNOSIS — I700 Atherosclerosis of aorta: Secondary | ICD-10-CM | POA: Diagnosis not present

## 2022-01-21 DIAGNOSIS — I10 Essential (primary) hypertension: Secondary | ICD-10-CM | POA: Diagnosis not present

## 2022-01-25 ENCOUNTER — Other Ambulatory Visit (HOSPITAL_COMMUNITY)
Admission: AD | Admit: 2022-01-25 | Discharge: 2022-01-25 | Disposition: A | Discharge status: Home / Self Care | Payer: PPO | Source: Ambulatory Visit | Attending: Ophthalmology | Admitting: Ophthalmology

## 2022-01-25 DIAGNOSIS — R519 Headache, unspecified: Secondary | ICD-10-CM | POA: Diagnosis not present

## 2022-01-25 DIAGNOSIS — H401132 Primary open-angle glaucoma, bilateral, moderate stage: Secondary | ICD-10-CM | POA: Diagnosis not present

## 2022-01-25 DIAGNOSIS — M316 Other giant cell arteritis: Secondary | ICD-10-CM | POA: Diagnosis not present

## 2022-01-25 LAB — CBC WITH DIFFERENTIAL/PLATELET
Abs Immature Granulocytes: 0.01 10*3/uL (ref 0.00–0.07)
Basophils Absolute: 0 10*3/uL (ref 0.0–0.1)
Basophils Relative: 1 %
Eosinophils Absolute: 0.1 10*3/uL (ref 0.0–0.5)
Eosinophils Relative: 2 %
HCT: 41.2 % (ref 36.0–46.0)
Hemoglobin: 14 g/dL (ref 12.0–15.0)
Immature Granulocytes: 0 %
Lymphocytes Relative: 32 %
Lymphs Abs: 1.5 10*3/uL (ref 0.7–4.0)
MCH: 35.4 pg — ABNORMAL HIGH (ref 26.0–34.0)
MCHC: 34 g/dL (ref 30.0–36.0)
MCV: 104.3 fL — ABNORMAL HIGH (ref 80.0–100.0)
Monocytes Absolute: 0.5 10*3/uL (ref 0.1–1.0)
Monocytes Relative: 10 %
Neutro Abs: 2.5 10*3/uL (ref 1.7–7.7)
Neutrophils Relative %: 55 %
Platelets: 210 10*3/uL (ref 150–400)
RBC: 3.95 MIL/uL (ref 3.87–5.11)
RDW: 14 % (ref 11.5–15.5)
WBC: 4.6 10*3/uL (ref 4.0–10.5)
nRBC: 0 % (ref 0.0–0.2)

## 2022-01-25 LAB — SEDIMENTATION RATE: Sed Rate: 19 mm/hr (ref 0–22)

## 2022-01-25 LAB — C-REACTIVE PROTEIN: CRP: 0.7 mg/dL (ref <1.0)

## 2022-01-28 ENCOUNTER — Telehealth: Payer: Self-pay

## 2022-01-28 NOTE — Patient Outreach (Signed)
  Care Coordination   Initial Visit Note   01/28/2022 Name: Cassandra Brooks MRN: [id number] DOB: March 04, 1941  Cassandra Brooks is Brooks 81 y.o. year old female who sees Moon, Amy A, NP for primary care. I spoke with  Cassandra Brooks by phone today.  What matters to the patients health and wellness today?  Placed call to patient and reviewed and offered [facility] care coordination program. Patient declines    SDOH assessments and interventions completed:  No     Care Coordination Interventions Activated:  No  Care Coordination Interventions:  No, not indicated   Follow up plan: No further intervention required.   Encounter Outcome:  Pt. Refused   Tomasa Rand, RN, BSN, CEN [facility] [employer] [phone]

## 2022-02-28 ENCOUNTER — Telehealth: Payer: Self-pay | Admitting: Internal Medicine

## 2022-02-28 ENCOUNTER — Telehealth: Payer: Self-pay | Admitting: Physical Medicine and Rehabilitation

## 2022-02-28 NOTE — Telephone Encounter (Signed)
Pt husband called in requesting appt with Dr. Ernestina Patches for pt for injection... Pt husband requesting callback

## 2022-02-28 NOTE — Telephone Encounter (Signed)
Error

## 2022-02-28 NOTE — Telephone Encounter (Signed)
scheduled

## 2022-03-01 ENCOUNTER — Encounter: Payer: Self-pay | Admitting: Physical Medicine and Rehabilitation

## 2022-03-01 ENCOUNTER — Ambulatory Visit: Payer: PPO | Admitting: Physical Medicine and Rehabilitation

## 2022-03-01 VITALS — BP 162/76 | HR 73

## 2022-03-01 DIAGNOSIS — M47816 Spondylosis without myelopathy or radiculopathy, lumbar region: Secondary | ICD-10-CM | POA: Diagnosis not present

## 2022-03-01 DIAGNOSIS — G8929 Other chronic pain: Secondary | ICD-10-CM | POA: Diagnosis not present

## 2022-03-01 DIAGNOSIS — M545 Low back pain, unspecified: Secondary | ICD-10-CM | POA: Diagnosis not present

## 2022-03-01 DIAGNOSIS — M797 Fibromyalgia: Secondary | ICD-10-CM | POA: Diagnosis not present

## 2022-03-01 NOTE — Progress Notes (Signed)
Cassandra Brooks - 81 y.o. female MRN [id number]  Date of birth: 09/14/1940  Office Visit Note: Visit Date: 03/01/2022 PCP: Lowella Dandy, NP Referred by: Lowella Dandy, NP  Subjective: Chief Complaint  Patient presents with   Lower Back - Pain   HPI: Cassandra Brooks is a 81 y.o. female who comes in today for evaluation of chronic, worsening and severe bilateral lower back pain. Pain ongoing for several years and worsens with activity and bending. She describes her pain as sore and aching, currently rates as 7 out of 10. Some relief of pain with home exercise regimen, rest and use of medications. Husband states she is typically very active, however recently has become more sedentary due to severe pain. Lumbar MRI imaging from November of 2022 exhibits moderate to severe spinal canal stenosis at L3-L4, moderate to severe facet arthropathy at L3-L4, mild at L4-L5 and L5-S1. History of multiple lumbar epidural steroid injections in our office over the years, variable relief of pain with these injections. Patient underwent laminectomy decompression of L2-L3 and L3-L4 with Dr. Basil Dess on 04/26/2021. Patient was hopeful lumbar surgery would take away all her pain, she reports resolution of radicular symptoms however lower back pain remains. More recently patient underwent bilateral L4-L5 and L5-S1 facet joint injections in our office on 10/06/2021, reports 3 months of pain relief with this procedure. States her pain has gradually returned over the last several weeks. Patient using cane to assist with ambulation. Patient denies focal weakness, numbness and tingling. Patient denies recent trauma or falls.    Review of Systems  Musculoskeletal:  Positive for back pain.  Neurological:  Negative for tingling, sensory change, focal weakness and weakness.  All other systems reviewed and are negative.  Otherwise per HPI.  Assessment & Plan: Visit Diagnoses:    ICD-10-CM   1. Chronic bilateral low back  pain without sciatica  M54.50 Ambulatory referral to Physical Medicine Rehab   G89.29     2. Spondylosis without myelopathy or radiculopathy, lumbar region  M47.816 Ambulatory referral to Physical Medicine Rehab    3. Facet hypertrophy of lumbar region  M47.816 Ambulatory referral to Physical Medicine Rehab    4. Fibromyalgia  M79.7 Ambulatory referral to Physical Medicine Rehab       Plan: Findings:  Chronic, worsening and severe bilateral axial back pain. Patient continues to have severe pain despite good conservative therapies such as home exercise regimen, rest and use of medications. Patients clinical presentation and exam are consistent with facet mediated pain. She does have severe pain with lumbar extension today. I also feel her fibromyalgia is working to exacerbate her symptoms. Significant relief of pain for 3 months with previous lumbar facet joint injections. Next step is to repeat bilateral L4-L5 and L5-S1 facet joint injections under fluoroscopic guidance. If good sustained relief of pain we can repeat this procedure infrequently as needed. If pain persists post injections I did discuss re-grouping with formal physical therapy, patient requesting [facility] in [place]. No red flag symptoms noted upon exam today.     Meds & Orders: No orders of the defined types were placed in this encounter.   Orders Placed This Encounter  Procedures   Ambulatory referral to Physical Medicine Rehab    Follow-up: Return for Bilateral L4-L5 and L5-S1 facet joint injections.   Procedures: No procedures performed      Clinical History: MRI LUMBAR SPINE WITHOUT CONTRAST   TECHNIQUE: Multiplanar, multisequence MR imaging of the lumbar spine  was performed. No intravenous contrast was administered.   COMPARISON:  01/11/2020   FINDINGS: Segmentation:  5 lumbar type vertebral bodies.   Alignment:  Slight increase in mild retrolisthesis of L2 on L3.   Vertebrae: No acute fracture or  suspicious osseous lesion. Interval development of Modic type 1 endplate degenerative changes at L2-L3 and L3-L4   Conus medullaris and cauda equina: Conus extends to the L2 level. Conus and cauda equina appear normal.   Paraspinal and other soft tissues: Negative.   Disc levels:   T12-L1: No significant disc bulge. No spinal canal stenosis or neural foraminal narrowing.   L1-L2: No significant disc bulge. No spinal canal stenosis or neural foraminal narrowing.   L2-L3: Progressive disc height loss with increased broad-based disc bulge, which narrows the lateral recesses. Mild-to-moderate facet arthropathy and ligamentum flavum hypertrophy. Moderate spinal canal stenosis, which has progressed from the prior exam. Mild left-greater-than-right neural foraminal narrowing.   L3-L4: Progressive disc height loss and increased disc bulge, which effaces the lateral recesses. Moderate to severe facet arthropathy and ligamentum flavum hypertrophy. Moderate to severe spinal canal stenosis, which has progressed from the prior exam. Mild-to-moderate left and mild right neural foraminal narrowing.   L4-L5: Disc osteophyte complex, left greater than right, unchanged. Narrowing of the lateral recesses. Mild facet arthropathy. No spinal canal stenosis. Moderate bilateral neural foraminal narrowing.   L5-S1: Mild disc bulge. Mild facet arthropathy. No spinal canal stenosis. Mild right and moderate left neural foraminal narrowing.   IMPRESSION: 1. L3-L4 moderate to severe spinal canal stenosis, which has progressed from the prior exam, with mild to moderate left and mild right neural foraminal narrowing. Effacement of the lateral recesses at this level likely compresses the descending L4 nerves. 2. L2-L3 moderate spinal canal stenosis, which has progressed from the prior exam, with mild left-greater-than-right neural foraminal narrowing. Narrowing of the lateral recesses at this level  may affect the descending L3 nerves. 3. L4-L5 moderate bilateral neural foraminal narrowing, unchanged. 4. L5-S1 moderate left and mild right neural foraminal narrowing. 5. Progressive disc height loss L2-L4, with Modic type 1 endplate degenerative changes, which can also cause pain.     Electronically Signed   By: Merilyn Baba M.D.   On: 02/12/2021 11:41   She reports that she has never smoked. She has never used smokeless tobacco. No results for input(s): "HGBA1C", "LABURIC" in the last 8760 hours.  Objective:  VS:  HT:    WT:   BMI:     BP:(!) 162/76  HR:73bpm  TEMP: ( )  RESP:93 % Physical Exam Vitals and nursing note reviewed.  HENT:     Head: Normocephalic and atraumatic.     Right Ear: External ear normal.     Left Ear: External ear normal.     Nose: Nose normal.     Mouth/Throat:     Mouth: Mucous membranes are moist.  Eyes:     Extraocular Movements: Extraocular movements intact.  Cardiovascular:     Rate and Rhythm: Normal rate.     Pulses: Normal pulses.  Pulmonary:     Effort: Pulmonary effort is normal.  Abdominal:     General: Abdomen is flat. There is no distension.  Musculoskeletal:        General: Tenderness present.     Cervical back: Normal range of motion.     Comments: Pt is slow to rise from seated position to standing.  Concordant low back pain with facet loading, lumbar spine extension and rotation.  Strong distal strength without clonus, no pain upon palpation of greater trochanters. Sensation intact bilaterally. Walks independently, gait steady.   Skin:    General: Skin is warm and dry.     Capillary Refill: Capillary refill takes less than 2 seconds.  Neurological:     Mental Status: She is alert and oriented to person, place, and time.     Gait: Gait abnormal.  Psychiatric:        Mood and Affect: Mood normal.        Behavior: Behavior normal.     Ortho Exam  Imaging: No results found.  Past Medical/Family/Surgical/Social  History: Medications & Allergies reviewed per EMR, new medications updated. Patient Active Problem List   Diagnosis Date Noted   Posterior tibial tendon dysfunction (PTTD) of right lower extremity 11/16/2021   Spinal stenosis, lumbar region with neurogenic claudication 04/26/2021   Spinal stenosis of lumbar region with neurogenic claudication 04/26/2021   Pre-diabetes    Status post total left knee replacement 03/27/2020   Essential hypertension 12/19/2019   Mixed dyslipidemia 12/19/2019   Diabetes mellitus due to underlying condition with unspecified complications ([place]) 74/94/4967   Preoperative cardiovascular examination 12/19/2019   Status post AAA (abdominal aortic aneurysm) repair 12/19/2019   Syncope and collapse    Hypertension    High cholesterol    Glaucoma, both eyes    GERD (gastroesophageal reflux disease)    Fibromyalgia    Depression    Bulging lumbar disc    Borderline diabetes    Arthritis    AAA (abdominal aortic aneurysm) ([place])    Primary osteoarthritis of left knee 07/18/2019   Disequilibrium    Syncope 03/21/2017   Dizziness    Fall    Hemoptysis 03/18/2015   Solitary pulmonary nodule 03/18/2015   Nuclear cataract of both eyes 01/01/2014   Past Medical History:  Diagnosis Date   AAA (abdominal aortic aneurysm) ([place])    Arthritis    "knees" (03/21/2017)   Borderline diabetes    Bulging lumbar disc    Depression    Disequilibrium    Dizziness    Essential hypertension 12/19/2019   Fall    Fibromyalgia    GERD (gastroesophageal reflux disease)    Glaucoma, both eyes    Hemoptysis 03/18/2015   High cholesterol    "don't have it but I take RX" (03/21/2017)   Hypertension    Mixed dyslipidemia 12/19/2019   Nuclear cataract of both eyes 01/01/2014   Pre-diabetes    pt states she is not diabetic. Last A1C was normal. Pt is on no medications for DM   Preoperative cardiovascular examination 12/19/2019   Primary osteoarthritis of left knee 07/18/2019    Solitary pulmonary nodule 03/18/2015   03/2015 98m RLL nodule    Status post AAA (abdominal aortic aneurysm) repair 12/19/2019   Status post total left knee replacement 03/27/2020   Syncope 03/21/2017   Syncope and collapse    Family History  Problem Relation Age of Onset   Hypertension Father    Stomach cancer Father    Glaucoma Mother    Diabetes Mother    Diabetes Sister    Diabetes Brother    Lupus Child    Diabetes Brother    Lung cancer Brother    Colon cancer Brother    Past Surgical History:  Procedure Laterality Date   [place]    [place] years ago, no abnormalities  found   COLONOSCOPY     LUMBAR LAMINECTOMY/DECOMPRESSION MICRODISCECTOMY N/A 04/26/2021   Procedure: CENTRAL LAMINECTOMIES LUMBAR TWO-THREE,  LUMBAR THREE-FOUR WITH BILATERAL FORAMINOTOMIES;  Surgeon: Jessy Oto, MD;  Location: [facility];  Service: Orthopedics;  Laterality: N/A;   TOTAL KNEE ARTHROPLASTY Left 03/27/2020   Procedure: LEFT TOTAL KNEE ARTHROPLASTY;  Surgeon: Leandrew Koyanagi, MD;  Location: [facility];  Service: Orthopedics;  Laterality: Left;   VAGINAL HYSTERECTOMY     "partial"   VIDEO BRONCHOSCOPY Bilateral 03/19/2015   Procedure: VIDEO BRONCHOSCOPY WITHOUT FLUORO;  Surgeon: Rigoberto Noel, MD;  [facility];  Service: Cardiopulmonary;  Laterality: Bilateral;   Social History   Occupational History   Occupation: retired    Comment: Charity fundraiser; dietician  Tobacco Use   Smoking status: Never   Smokeless tobacco: Never  Vaping Use   Vaping Use: Never used  Substance and Sexual Activity   Alcohol use: No   Drug use: No   Sexual activity: Not on file

## 2022-03-18 ENCOUNTER — Ambulatory Visit: Payer: PPO | Admitting: Orthopaedic Surgery

## 2022-03-18 ENCOUNTER — Ambulatory Visit (INDEPENDENT_AMBULATORY_CARE_PROVIDER_SITE_OTHER): Payer: PPO

## 2022-03-18 DIAGNOSIS — Z96652 Presence of left artificial knee joint: Secondary | ICD-10-CM

## 2022-03-18 MED ORDER — AMOXICILLIN 500 MG PO CAPS: ORAL_CAPSULE | ORAL | 2 refills | Status: DC

## 2022-03-18 NOTE — Progress Notes (Signed)
Post-Op Visit Note   Patient: Cassandra Brooks           Date of Birth: 1941-02-20           MRN: [id number] Visit Date: 03/18/2022 PCP: Lowella Dandy, NP   Assessment & Plan:  Chief Complaint:  Chief Complaint  Patient presents with   Left Knee - Pain   Visit Diagnoses:  1. Status post total left knee replacement     Plan: Patient is a pleasant 81 year old female who comes in today 1 year status post left total knee replacement 03/27/2021.  She has been doing well.  No complaints.  Examination of the left knee reveals range of motion from 0 to 125 degrees.  She is stable vascular stress.  She is neurovascular intact distally.  At this point, she will continue to advance with activity as tolerated.  Dental prophylaxis reinforced.  Follow-up in 1 year for repeat evaluation and 2 view x-rays of the left knee.  Call with concerns or questions.  Follow-Up Instructions: Return in about 1 year (around 03/19/2023).   Orders:  Orders Placed This Encounter  Procedures   XR Knee 1-2 Views Left   No orders of the defined types were placed in this encounter.   Imaging: No results found.  PMFS History: Patient Active Problem List   Diagnosis Date Noted   Posterior tibial tendon dysfunction (PTTD) of right lower extremity 11/16/2021   Spinal stenosis, lumbar region with neurogenic claudication 04/26/2021   Spinal stenosis of lumbar region with neurogenic claudication 04/26/2021   Pre-diabetes    Status post total left knee replacement 03/27/2020   Essential hypertension 12/19/2019   Mixed dyslipidemia 12/19/2019   Diabetes mellitus due to underlying condition with unspecified complications ([place]) 78/[id number]/8101   Preoperative cardiovascular examination 12/19/2019   Status post AAA (abdominal aortic aneurysm) repair 12/19/2019   Syncope and collapse    Hypertension    High cholesterol    Glaucoma, both eyes    GERD (gastroesophageal reflux disease)    Fibromyalgia    Depression     Bulging lumbar disc    Borderline diabetes    Arthritis    AAA (abdominal aortic aneurysm) ([place])    Primary osteoarthritis of left knee 07/18/2019   Disequilibrium    Syncope 03/21/2017   Dizziness    Fall    Hemoptysis 03/18/2015   Solitary pulmonary nodule 03/18/2015   Nuclear cataract of both eyes 01/01/2014   Past Medical History:  Diagnosis Date   AAA (abdominal aortic aneurysm) ([place])    Arthritis    "knees" (03/21/2017)   Borderline diabetes    Bulging lumbar disc    Depression    Disequilibrium    Dizziness    Essential hypertension 12/19/2019   Fall    Fibromyalgia    GERD (gastroesophageal reflux disease)    Glaucoma, both eyes    Hemoptysis 03/18/2015   High cholesterol    "don't have it but I take RX" (03/21/2017)   Hypertension    Mixed dyslipidemia 12/19/2019   Nuclear cataract of both eyes 01/01/2014   Pre-diabetes    pt states she is not diabetic. Last A1C was normal. Pt is on no medications for DM   Preoperative cardiovascular examination 12/19/2019   Primary osteoarthritis of left knee 07/18/2019   Solitary pulmonary nodule 03/18/2015   03/2015 64m RLL nodule    Status post AAA (abdominal aortic aneurysm) repair 12/19/2019   Status post total left knee replacement 03/27/2020  Syncope 03/21/2017   Syncope and collapse     Family History  Problem Relation Age of Onset   Hypertension Father    Stomach cancer Father    Glaucoma Mother    Diabetes Mother    Diabetes Sister    Diabetes Brother    Lupus Child    Diabetes Brother    Lung cancer Brother    Colon cancer Brother     Past Surgical History:  Procedure Laterality Date   ABDOMINAL AORTIC ANEURYSM [place]     20+ years ago, no abnormalities found   COLONOSCOPY     LUMBAR LAMINECTOMY/DECOMPRESSION MICRODISCECTOMY N/A 04/26/2021   Procedure: CENTRAL LAMINECTOMIES LUMBAR TWO-THREE,  LUMBAR THREE-FOUR WITH BILATERAL FORAMINOTOMIES;   Surgeon: Jessy Oto, MD;  Location: [facility];  Service: Orthopedics;  Laterality: N/A;   TOTAL KNEE ARTHROPLASTY Left 03/27/2020   Procedure: LEFT TOTAL KNEE ARTHROPLASTY;  Surgeon: Leandrew Koyanagi, MD;  Location: [facility];  Service: Orthopedics;  Laterality: Left;   VAGINAL HYSTERECTOMY     "partial"   VIDEO BRONCHOSCOPY Bilateral 03/19/2015   Procedure: VIDEO BRONCHOSCOPY WITHOUT FLUORO;  Surgeon: Rigoberto Noel, MD;  Location: [facility];  Service: Cardiopulmonary;  Laterality: Bilateral;   Social History   Occupational History   Occupation: retired    Comment: Charity fundraiser; dietician  Tobacco Use   Smoking status: Never   Smokeless tobacco: Never  Vaping Use   Vaping Use: Never used  Substance and Sexual Activity   Alcohol use: No   Drug use: No   Sexual activity: Not on file

## 2022-03-22 ENCOUNTER — Ambulatory Visit: Payer: PPO | Admitting: Physical Medicine and Rehabilitation

## 2022-03-22 ENCOUNTER — Ambulatory Visit: Payer: Self-pay

## 2022-03-22 VITALS — BP 147/80 | HR 76

## 2022-03-22 DIAGNOSIS — M47816 Spondylosis without myelopathy or radiculopathy, lumbar region: Secondary | ICD-10-CM

## 2022-03-22 MED ORDER — METHYLPREDNISOLONE ACETATE 80 MG/ML IJ SUSP
80.0000 mg | Freq: Once | INTRAMUSCULAR | Status: AC
  Administered 2022-03-22: 80 mg

## 2022-03-22 NOTE — Progress Notes (Signed)
Numeric Pain Rating Scale and Functional Assessment Average Pain 7   In the last MONTH (on 0-10 scale) has pain interfered with the following?  1. General activity like being  able to carry out your everyday physical activities such as walking, climbing stairs, carrying groceries, or moving a chair?  Rating(10)   +Driver, -BT, -Dye Allergies.  Lower back pain on both sides. Can have pain radiation on both leg at times

## 2022-03-22 NOTE — Patient Instructions (Signed)

## 2022-03-29 NOTE — Progress Notes (Signed)
Cassandra Brooks - 81 y.o. female MRN [id number]  Date of birth: Aug 13, 1940  Office Visit Note: Visit Date: 03/22/2022 PCP: Lowella Dandy, NP Referred by: Lowella Dandy, NP  Subjective: Chief Complaint  Patient presents with   Lower Back - Pain   HPI:  Cassandra Brooks is a 81 y.o. female who comes in today for planned repeat Bilateral L4-5 and L5-S1 Lumbar facet/medial branch block with fluoroscopic guidance.  The patient has failed conservative care including home exercise, medications, time and activity modification.  This injection will be diagnostic and hopefully therapeutic.  Please see requesting physician notes for further details and justification.  Exam shows concordant low back pain with facet joint loading and extension. Patient received more than 80% pain relief from prior injection.  She also has history of severe stenosis at L3-4 centrally.  Epidural injections in the past have been somewhat successful but the last intra-articular facet joint block gave her almost 4 to 5 months of relief of her back pain.  She rarely gets pain into the hip or leg.     Referring:Dr. Basil Dess and Benjiman Core, PA-C   ROS Otherwise per HPI.  Assessment & Plan: Visit Diagnoses:    ICD-10-CM   1. Spondylosis without myelopathy or radiculopathy, lumbar region  M47.816 XR C-ARM NO REPORT    Facet Injection    methylPREDNISolone acetate (DEPO-MEDROL) injection 80 mg    CANCELED: Epidural Steroid injection      Plan: No additional findings.   Meds & Orders:  Meds ordered this encounter  Medications   methylPREDNISolone acetate (DEPO-MEDROL) injection 80 mg    Orders Placed This Encounter  Procedures   Facet Injection   XR C-ARM NO REPORT    Follow-up: Return for visit to requesting provider as needed.   Procedures: No procedures performed  Lumbar Facet Joint Intra-Articular Injection(s) with Fluoroscopic Guidance  Patient: Cassandra Brooks      Date of Birth: October 26, 1940 MRN:  [id number] PCP: Lowella Dandy, NP      Visit Date: 03/22/2022   Universal Protocol:    Date/Time: 03/22/2022  Consent Given By: the patient  Position: PRONE   Additional Comments: Vital signs were monitored before and after the procedure. Patient was prepped and draped in the usual sterile fashion. The correct patient, procedure, and site was verified.   Injection Procedure Details:  Procedure Site One Meds Administered:  Meds ordered this encounter  Medications   methylPREDNISolone acetate (DEPO-MEDROL) injection 80 mg     Laterality: Bilateral  Location/Site:  L4-L5 L5-S1  Needle size: 22 guage  Needle type: Spinal  Needle Placement: Articular  Findings:  -Comments: Excellent flow of contrast producing a partial arthrogram.  Procedure Details: The fluoroscope beam is vertically oriented in AP, and the inferior recess is visualized beneath the lower pole of the inferior apophyseal process, which represents the target point for needle insertion. When direct visualization is difficult the target point is located at the medial projection of the vertebral pedicle. The region overlying each aforementioned target is locally anesthetized with a 1 to 2 ml. volume of 1% Lidocaine without Epinephrine.   The spinal needle was inserted into each of the above mentioned facet joints using biplanar fluoroscopic guidance. A 0.25 to 0.5 ml. volume of Isovue-250 was injected and a partial facet joint arthrogram was obtained. A single spot film was obtained of the resulting arthrogram.    One to 1.25 ml of the steroid/anesthetic solution was then injected into each of  the facet joints noted above.   Additional Comments:  The patient tolerated the procedure well Dressing: 2 x 2 sterile gauze and Band-Aid    Post-procedure details: Patient was observed during the procedure. Post-procedure instructions were reviewed.  Patient left the clinic in stable condition.    Clinical  History: MRI LUMBAR SPINE WITHOUT CONTRAST   TECHNIQUE: Multiplanar, multisequence MR imaging of the lumbar spine was performed. No intravenous contrast was administered.   COMPARISON:  01/11/2020   FINDINGS: Segmentation:  5 lumbar type vertebral bodies.   Alignment:  Slight increase in mild retrolisthesis of L2 on L3.   Vertebrae: No acute fracture or suspicious osseous lesion. Interval development of Modic type 1 endplate degenerative changes at L2-L3 and L3-L4   Conus medullaris and cauda equina: Conus extends to the L2 level. Conus and cauda equina appear normal.   Paraspinal and other soft tissues: Negative.   Disc levels:   T12-L1: No significant disc bulge. No spinal canal stenosis or neural foraminal narrowing.   L1-L2: No significant disc bulge. No spinal canal stenosis or neural foraminal narrowing.   L2-L3: Progressive disc height loss with increased broad-based disc bulge, which narrows the lateral recesses. Mild-to-moderate facet arthropathy and ligamentum flavum hypertrophy. Moderate spinal canal stenosis, which has progressed from the prior exam. Mild left-greater-than-right neural foraminal narrowing.   L3-L4: Progressive disc height loss and increased disc bulge, which effaces the lateral recesses. Moderate to severe facet arthropathy and ligamentum flavum hypertrophy. Moderate to severe spinal canal stenosis, which has progressed from the prior exam. Mild-to-moderate left and mild right neural foraminal narrowing.   L4-L5: Disc osteophyte complex, left greater than right, unchanged. Narrowing of the lateral recesses. Mild facet arthropathy. No spinal canal stenosis. Moderate bilateral neural foraminal narrowing.   L5-S1: Mild disc bulge. Mild facet arthropathy. No spinal canal stenosis. Mild right and moderate left neural foraminal narrowing.   IMPRESSION: 1. L3-L4 moderate to severe spinal canal stenosis, which has progressed from the prior  exam, with mild to moderate left and mild right neural foraminal narrowing. Effacement of the lateral recesses at this level likely compresses the descending L4 nerves. 2. L2-L3 moderate spinal canal stenosis, which has progressed from the prior exam, with mild left-greater-than-right neural foraminal narrowing. Narrowing of the lateral recesses at this level may affect the descending L3 nerves. 3. L4-L5 moderate bilateral neural foraminal narrowing, unchanged. 4. L5-S1 moderate left and mild right neural foraminal narrowing. 5. Progressive disc height loss L2-L4, with Modic type 1 endplate degenerative changes, which can also cause pain.     Electronically Signed   By: Merilyn Baba M.D.   On: 02/12/2021 11:41     Objective:  VS:  HT:    WT:   BMI:     BP:(!) 147/80  HR:76bpm  TEMP: ( )  RESP:  Physical Exam Vitals and nursing note reviewed.  Constitutional:      General: She is not in acute distress.    Appearance: Normal appearance. She is not ill-appearing.  HENT:     Head: Normocephalic and atraumatic.     Right Ear: External ear normal.     Left Ear: External ear normal.  Eyes:     Extraocular Movements: Extraocular movements intact.  Cardiovascular:     Rate and Rhythm: Normal rate.     Pulses: Normal pulses.  Pulmonary:     Effort: Pulmonary effort is normal. No respiratory distress.  Abdominal:     General: There is no distension.  Palpations: Abdomen is soft.  Musculoskeletal:        General: Tenderness present.     Cervical back: Neck supple.     Right lower leg: No edema.     Left lower leg: No edema.     Comments: Patient has good distal strength with no pain over the greater trochanters.  No clonus or focal weakness.  Skin:    Findings: No erythema, lesion or rash.  Neurological:     General: No focal deficit present.     Mental Status: She is alert and oriented to person, place, and time.     Sensory: No sensory deficit.     Motor: No  weakness or abnormal muscle tone.     Coordination: Coordination normal.  Psychiatric:        Mood and Affect: Mood normal.        Behavior: Behavior normal.      Imaging: No results found.

## 2022-03-29 NOTE — Procedures (Signed)
Lumbar Facet Joint Intra-Articular Injection(s) with Fluoroscopic Guidance  Patient: Cassandra Brooks      Date of Birth: [birth year] MRN: [id number] PCP: Lowella Dandy, NP      Visit Date: 03/22/2022   Universal Protocol:    Date/Time: 03/22/2022  Consent Given By: the patient  Position: PRONE   Additional Comments: Vital signs were monitored before and after the procedure. Patient was prepped and draped in the usual sterile fashion. The correct patient, procedure, and site was verified.   Injection Procedure Details:  Procedure Site One Meds Administered:  Meds ordered this encounter  Medications   methylPREDNISolone acetate (DEPO-MEDROL) injection 80 mg     Laterality: Bilateral  Location/Site:  L4-L5 L5-S1  Needle size: 22 guage  Needle type: Spinal  Needle Placement: Articular  Findings:  -Comments: Excellent flow of contrast producing a partial arthrogram.  Procedure Details: The fluoroscope beam is vertically oriented in AP, and the inferior recess is visualized beneath the lower pole of the inferior apophyseal process, which represents the target point for needle insertion. When direct visualization is difficult the target point is located at the medial projection of the vertebral pedicle. The region overlying each aforementioned target is locally anesthetized with a 1 to 2 ml. volume of 1% Lidocaine without Epinephrine.   The spinal needle was inserted into each of the above mentioned facet joints using biplanar fluoroscopic guidance. A 0.25 to 0.5 ml. volume of Isovue-250 was injected and a partial facet joint arthrogram was obtained. A single spot film was obtained of the resulting arthrogram.    One to 1.25 ml of the steroid/anesthetic solution was then injected into each of the facet joints noted above.   Additional Comments:  The patient tolerated the procedure well Dressing: 2 x 2 sterile gauze and Band-Aid    Post-procedure details: Patient was  observed during the procedure. Post-procedure instructions were reviewed.  Patient left the clinic in stable condition.

## 2022-05-27 DIAGNOSIS — Z79899 Other long term (current) drug therapy: Secondary | ICD-10-CM | POA: Diagnosis not present

## 2022-05-27 DIAGNOSIS — I10 Essential (primary) hypertension: Secondary | ICD-10-CM | POA: Diagnosis not present

## 2022-05-27 DIAGNOSIS — F325 Major depressive disorder, single episode, in full remission: Secondary | ICD-10-CM | POA: Diagnosis not present

## 2022-05-27 DIAGNOSIS — F411 Generalized anxiety disorder: Secondary | ICD-10-CM | POA: Diagnosis not present

## 2022-05-27 DIAGNOSIS — R7301 Impaired fasting glucose: Secondary | ICD-10-CM | POA: Diagnosis not present

## 2022-05-27 DIAGNOSIS — K219 Gastro-esophageal reflux disease without esophagitis: Secondary | ICD-10-CM | POA: Diagnosis not present

## 2022-05-27 DIAGNOSIS — E785 Hyperlipidemia, unspecified: Secondary | ICD-10-CM | POA: Diagnosis not present

## 2022-06-16 ENCOUNTER — Telehealth: Payer: Self-pay | Admitting: Physical Medicine and Rehabilitation

## 2022-06-16 NOTE — Telephone Encounter (Signed)
Pt's husband Randell Patient called to set an appt for his wife for an injection. Please call pt at [phone].

## 2022-06-21 DIAGNOSIS — J22 Unspecified acute lower respiratory infection: Secondary | ICD-10-CM | POA: Diagnosis not present

## 2022-06-21 DIAGNOSIS — Z20822 Contact with and (suspected) exposure to covid-19: Secondary | ICD-10-CM | POA: Diagnosis not present

## 2022-06-21 DIAGNOSIS — F419 Anxiety disorder, unspecified: Secondary | ICD-10-CM | POA: Diagnosis not present

## 2022-06-21 DIAGNOSIS — I10 Essential (primary) hypertension: Secondary | ICD-10-CM | POA: Diagnosis not present

## 2022-06-21 NOTE — Telephone Encounter (Signed)
Spoke with patient's husband and scheduled OV for 06/27/22

## 2022-06-21 NOTE — Telephone Encounter (Signed)
Spoke with patient's husband and he stated the patient is having pain again. He stated she did not receive at least 70% relief and it did not work at all. Please advise

## 2022-06-27 ENCOUNTER — Encounter: Payer: Self-pay | Admitting: Physical Medicine and Rehabilitation

## 2022-06-27 ENCOUNTER — Ambulatory Visit (INDEPENDENT_AMBULATORY_CARE_PROVIDER_SITE_OTHER): Payer: Medicare Other | Admitting: Physical Medicine and Rehabilitation

## 2022-06-27 DIAGNOSIS — M797 Fibromyalgia: Secondary | ICD-10-CM

## 2022-06-27 DIAGNOSIS — M5416 Radiculopathy, lumbar region: Secondary | ICD-10-CM | POA: Diagnosis not present

## 2022-06-27 DIAGNOSIS — M47816 Spondylosis without myelopathy or radiculopathy, lumbar region: Secondary | ICD-10-CM

## 2022-06-27 DIAGNOSIS — Z9889 Other specified postprocedural states: Secondary | ICD-10-CM

## 2022-06-27 NOTE — Progress Notes (Unsigned)
Functional Pain Scale - descriptive words and definitions  Distressing (6)    Pain is present/unable to complete most ADLs limited by pain/sleep is difficult and active distraction is only marginal. Moderate range order  Average Pain 8   Lower back pain. Injection did not help

## 2022-06-27 NOTE — Progress Notes (Unsigned)
Cassandra Brooks - 82 y.o. female MRN [id number]  Date of birth: 1940/08/07  Office Visit Note: Visit Date: 06/27/2022 PCP: Lowella Dandy, NP Referred by: Lowella Dandy, NP  Subjective: Chief Complaint  Patient presents with   Lower Back - Pain   HPI: Cassandra Brooks is a 82 y.o. female who comes in today for evaluation of chronic, worsening and severe lower back pain and intermittently down both legs. Pain ongoing for several years and worsens with activity and movement. She describes pain as sore and aching, does have intermittent tingling down both legs, currently rates as 8 out of 10. Some relief of pain with home exercise regimen, rest and use of medications. History of formal physical therapy with minimal relief of pain. Lumbar MRI imaging from 2022 exhibits moderate to severe spinal canal stenosis at L3-L4, moderate spinal canal stenosis at L2-L3, moderate to severe facet arthropathy at L3-L4, mild at L4-L5 and L5-S1. History of multiple lumbar epidural steroid injections in our office over the years, variable relief of pain with these injections. History of laminectomy decompression of L2-L3 and L3-L4 with Dr. Basil Dess on 04/26/2021. Patient was hopeful lumbar surgery would help to resolve her pain, however she continues to experience severe discomfort. Patient underwent bilateral L4-L5 and L5-S1 facet joint injections in our office in June of 2023, good relief of pain with this procedure for over 3 months. More recently we repeated facet joint injections with no relief of pain. States she is very sedentary due to pain. Patient denies focal weakness. No recent trauma or falls.       Review of Systems  Musculoskeletal:  Positive for back pain.  Neurological:  Negative for tingling, sensory change, focal weakness and weakness.  All other systems reviewed and are negative.  Otherwise per HPI.  Assessment & Plan: Visit Diagnoses:    ICD-10-CM   1. Lumbar radiculopathy  M54.16  Ambulatory referral to Physical Therapy    MR LUMBAR SPINE WO CONTRAST    2. Facet hypertrophy of lumbar region  M47.816 Ambulatory referral to Physical Therapy    MR LUMBAR SPINE WO CONTRAST    3. History of lumbar laminectomy for spinal cord decompression  Z98.890 Ambulatory referral to Physical Therapy    MR LUMBAR SPINE WO CONTRAST    4. Fibromyalgia  M79.7 Ambulatory referral to Physical Therapy    MR LUMBAR SPINE WO CONTRAST       Plan: Findings:  Chronic, worsening and severe lower back pain and intermittently down both legs. Patient continues to have severe pain despite good conservative therapy such as formal physical therapy, home exercise regimen, rest and use of medications.  Patient's clinical presentation and exam are complex, her pain pattern is more nondermatomal. She does experience paraesthesias down both legs. Minimal relief of pain with previous interventional spine procedures. No relief with recent lumbar facet joint injections. At this point, we do not recommend repeat lumbar injections. We are not able to move forward with radiofrequency ablation as diagnostic facet joint injections did not provide pain relief. Next step is to obtain new lumbar MRI imaging. I also placed order for patient to re-group with physical therapy at [facility] in [place]. We will have patient follow up with [place] to discuss lumbar MRI imaging. No red flag symptoms noted upon exam today.     Meds & Orders: No orders of the defined types were placed in this encounter.   Orders Placed This Encounter  Procedures   MR  LUMBAR SPINE WO CONTRAST   Ambulatory referral to Physical Therapy    Follow-up: Return for lumbar MRI review.   Procedures: No procedures performed      Clinical History: MRI LUMBAR SPINE WITHOUT CONTRAST   TECHNIQUE: Multiplanar, multisequence MR imaging of the lumbar spine was performed. No intravenous contrast was administered.   COMPARISON:  01/11/2020    FINDINGS: Segmentation:  5 lumbar type vertebral bodies.   Alignment:  Slight increase in mild retrolisthesis of L2 on L3.   Vertebrae: No acute fracture or suspicious osseous lesion. Interval development of Modic type 1 endplate degenerative changes at L2-L3 and L3-L4   Conus medullaris and cauda equina: Conus extends to the L2 level. Conus and cauda equina appear normal.   Paraspinal and other soft tissues: Negative.   Disc levels:   T12-L1: No significant disc bulge. No spinal canal stenosis or neural foraminal narrowing.   L1-L2: No significant disc bulge. No spinal canal stenosis or neural foraminal narrowing.   L2-L3: Progressive disc height loss with increased broad-based disc bulge, which narrows the lateral recesses. Mild-to-moderate facet arthropathy and ligamentum flavum hypertrophy. Moderate spinal canal stenosis, which has progressed from the prior exam. Mild left-greater-than-right neural foraminal narrowing.   L3-L4: Progressive disc height loss and increased disc bulge, which effaces the lateral recesses. Moderate to severe facet arthropathy and ligamentum flavum hypertrophy. Moderate to severe spinal canal stenosis, which has progressed from the prior exam. Mild-to-moderate left and mild right neural foraminal narrowing.   L4-L5: Disc osteophyte complex, left greater than right, unchanged. Narrowing of the lateral recesses. Mild facet arthropathy. No spinal canal stenosis. Moderate bilateral neural foraminal narrowing.   L5-S1: Mild disc bulge. Mild facet arthropathy. No spinal canal stenosis. Mild right and moderate left neural foraminal narrowing.   IMPRESSION: 1. L3-L4 moderate to severe spinal canal stenosis, which has progressed from the prior exam, with mild to moderate left and mild right neural foraminal narrowing. Effacement of the lateral recesses at this level likely compresses the descending L4 nerves. 2. L2-L3 moderate spinal canal  stenosis, which has progressed from the prior exam, with mild left-greater-than-right neural foraminal narrowing. Narrowing of the lateral recesses at this level may affect the descending L3 nerves. 3. L4-L5 moderate bilateral neural foraminal narrowing, unchanged. 4. L5-S1 moderate left and mild right neural foraminal narrowing. 5. Progressive disc height loss L2-L4, with Modic type 1 endplate degenerative changes, which can also cause pain.     Electronically Signed   By: Merilyn Baba M.D.   On: 02/12/2021 11:41   She reports that she has never smoked. She has never used smokeless tobacco. No results for input(s): "HGBA1C", "LABURIC" in the last 8760 hours.  Objective:  VS:  HT:    WT:   BMI:     BP:   HR: bpm  TEMP: ( )  RESP:  Physical Exam Vitals and nursing note reviewed.  HENT:     Head: Normocephalic and atraumatic.     Right Ear: External ear normal.     Left Ear: External ear normal.     Nose: Nose normal.     Mouth/Throat:     Mouth: Mucous membranes are moist.  Eyes:     Extraocular Movements: Extraocular movements intact.  Cardiovascular:     Rate and Rhythm: Normal rate.     Pulses: Normal pulses.  Pulmonary:     Effort: Pulmonary effort is normal.  Abdominal:     General: Abdomen is flat. There is no distension.  Musculoskeletal:        General: Tenderness present.     Cervical back: Normal range of motion.     Comments: Patient is slow to rise from seated position to standing. Good lumbar range of motion. No pain noted with facet loading. 5/5 strength noted with bilateral hip flexion, knee flexion/extension, ankle dorsiflexion/plantarflexion and EHL. No clonus noted bilaterally. No pain upon palpation of greater trochanters. No pain with internal/external rotation of bilateral hips. Sensation intact bilaterally. Negative slump test bilaterally. Ambulates without aid, gait steady.     Skin:    General: Skin is warm and dry.     Capillary Refill:  Capillary refill takes less than 2 seconds.  Neurological:     General: No focal deficit present.     Mental Status: She is alert and oriented to person, place, and time.  Psychiatric:        Mood and Affect: Mood normal.        Behavior: Behavior normal.     Ortho Exam  Imaging: No results found.  Past Medical/Family/Surgical/Social History: Medications & Allergies reviewed per EMR, new medications updated. Patient Active Problem List   Diagnosis Date Noted   Posterior tibial tendon dysfunction (PTTD) of right lower extremity 11/16/2021   Spinal stenosis, lumbar region with neurogenic claudication 04/26/2021   Spinal stenosis of lumbar region with neurogenic claudication 04/26/2021   Pre-diabetes    Status post total left knee replacement 03/27/2020   Essential hypertension 12/19/2019   Mixed dyslipidemia 12/19/2019   Diabetes mellitus due to underlying condition with unspecified complications ([place]) 0000000   Preoperative cardiovascular examination 12/19/2019   Status post AAA (abdominal aortic aneurysm) repair 12/19/2019   Syncope and collapse    Hypertension    High cholesterol    Glaucoma, both eyes    GERD (gastroesophageal reflux disease)    Fibromyalgia    Depression    Bulging lumbar disc    Borderline diabetes    Arthritis    AAA (abdominal aortic aneurysm) ([place])    Primary osteoarthritis of left knee 07/18/2019   Disequilibrium    Syncope 03/21/2017   Dizziness    Fall    Hemoptysis 03/18/2015   Solitary pulmonary nodule 03/18/2015   Nuclear cataract of both eyes 01/01/2014   Past Medical History:  Diagnosis Date   AAA (abdominal aortic aneurysm) ([place])    Arthritis    "knees" (03/21/2017)   Borderline diabetes    Bulging lumbar disc    Depression    Disequilibrium    Dizziness    Essential hypertension 12/19/2019   Fall    Fibromyalgia    GERD (gastroesophageal reflux disease)    Glaucoma, both eyes    Hemoptysis 03/18/2015   High  cholesterol    "don't have it but I take RX" (03/21/2017)   Hypertension    Mixed dyslipidemia 12/19/2019   Nuclear cataract of both eyes 01/01/2014   Pre-diabetes    pt states she is not diabetic. Last A1C was normal. Pt is on no medications for DM   Preoperative cardiovascular examination 12/19/2019   Primary osteoarthritis of left knee 07/18/2019   Solitary pulmonary nodule 03/18/2015   03/2015 3mm RLL nodule    Status post AAA (abdominal aortic aneurysm) repair 12/19/2019   Status post total left knee replacement 03/27/2020   Syncope 03/21/2017   Syncope and collapse    Family History  Problem Relation Age of Onset   Hypertension Father    Stomach cancer Father  Glaucoma Mother    Diabetes Mother    Diabetes Sister    Diabetes Brother    Lupus Child    Diabetes Brother    Lung cancer Brother    Colon cancer Brother    Past Surgical History:  Procedure Laterality Date   ABDOMINAL AORTIC ANEURYSM [place]     20+ years ago, no abnormalities found   COLONOSCOPY     LUMBAR LAMINECTOMY/DECOMPRESSION MICRODISCECTOMY N/A 04/26/2021   Procedure: CENTRAL LAMINECTOMIES LUMBAR TWO-THREE,  LUMBAR THREE-FOUR WITH BILATERAL FORAMINOTOMIES;  Surgeon: Jessy Oto, MD;  Location: [facility];  Service: Orthopedics;  Laterality: N/A;   TOTAL KNEE ARTHROPLASTY Left 03/27/2020   Procedure: LEFT TOTAL KNEE ARTHROPLASTY;  Surgeon: Leandrew Koyanagi, MD;  Location: [facility];  Service: Orthopedics;  Laterality: Left;   VAGINAL HYSTERECTOMY     "partial"   VIDEO BRONCHOSCOPY Bilateral 03/19/2015   Procedure: VIDEO BRONCHOSCOPY WITHOUT FLUORO;  Surgeon: Rigoberto Noel, MD;  Location: [facility];  Service: Cardiopulmonary;  Laterality: Bilateral;   Social History   Occupational History   Occupation: retired    Comment: Charity fundraiser; dietician  Tobacco Use   Smoking status: Never   Smokeless tobacco: Never  Vaping Use   Vaping Use: Never used   Substance and Sexual Activity   Alcohol use: No   Drug use: No   Sexual activity: Not on file

## 2022-07-17 ENCOUNTER — Ambulatory Visit
Admission: RE | Admit: 2022-07-17 | Discharge: 2022-07-17 | Disposition: A | Discharge status: Home / Self Care | Payer: Medicare Other | Source: Ambulatory Visit | Attending: Physical Medicine and Rehabilitation | Admitting: Physical Medicine and Rehabilitation

## 2022-07-17 DIAGNOSIS — M797 Fibromyalgia: Secondary | ICD-10-CM

## 2022-07-17 DIAGNOSIS — M5126 Other intervertebral disc displacement, lumbar region: Secondary | ICD-10-CM | POA: Diagnosis not present

## 2022-07-17 DIAGNOSIS — M5127 Other intervertebral disc displacement, lumbosacral region: Secondary | ICD-10-CM | POA: Diagnosis not present

## 2022-07-17 DIAGNOSIS — M48061 Spinal stenosis, lumbar region without neurogenic claudication: Secondary | ICD-10-CM | POA: Diagnosis not present

## 2022-07-17 DIAGNOSIS — M5416 Radiculopathy, lumbar region: Secondary | ICD-10-CM

## 2022-07-17 DIAGNOSIS — Z9889 Other specified postprocedural states: Secondary | ICD-10-CM

## 2022-07-17 DIAGNOSIS — M47816 Spondylosis without myelopathy or radiculopathy, lumbar region: Secondary | ICD-10-CM

## 2022-07-20 ENCOUNTER — Telehealth: Payer: Self-pay | Admitting: Physical Medicine and Rehabilitation

## 2022-07-20 NOTE — Telephone Encounter (Signed)
Spoke with patient this morning regarding recent lumbar MRI Imaging, Postsurgical changes from L2 and L3 laminectomy with improved/resolved spinal canal stenosis. Otherwise unchanged from previous imaging in 2022. At this point, we do not recommend interventional spine procedures. Minimal relief of pain with previous lumbar injections. She is to continue with PT, scheduled to start therapy at [facility] on 07/27/2022. She can follow up with is as needed.

## 2022-07-26 DIAGNOSIS — M6281 Muscle weakness (generalized): Secondary | ICD-10-CM | POA: Diagnosis not present

## 2022-07-26 DIAGNOSIS — M5451 Vertebrogenic low back pain: Secondary | ICD-10-CM | POA: Diagnosis not present

## 2022-07-28 ENCOUNTER — Other Ambulatory Visit: Payer: Self-pay

## 2022-07-28 ENCOUNTER — Emergency Department (HOSPITAL_COMMUNITY): Payer: Medicare Other

## 2022-07-28 ENCOUNTER — Encounter (HOSPITAL_COMMUNITY): Payer: Self-pay | Admitting: Emergency Medicine

## 2022-07-28 ENCOUNTER — Inpatient Hospital Stay (HOSPITAL_COMMUNITY)
Admission: EM | Admit: 2022-07-28 | Discharge: 2022-08-01 | DRG: 251 | Disposition: A | Discharge status: Home / Self Care | Payer: Medicare Other | Attending: Cardiovascular Disease | Admitting: Cardiovascular Disease

## 2022-07-28 DIAGNOSIS — E876 Hypokalemia: Secondary | ICD-10-CM | POA: Diagnosis not present

## 2022-07-28 DIAGNOSIS — K30 Functional dyspepsia: Secondary | ICD-10-CM | POA: Diagnosis present

## 2022-07-28 DIAGNOSIS — Z801 Family history of malignant neoplasm of trachea, bronchus and lung: Secondary | ICD-10-CM

## 2022-07-28 DIAGNOSIS — Z7982 Long term (current) use of aspirin: Secondary | ICD-10-CM | POA: Diagnosis not present

## 2022-07-28 DIAGNOSIS — R0602 Shortness of breath: Secondary | ICD-10-CM | POA: Diagnosis not present

## 2022-07-28 DIAGNOSIS — M797 Fibromyalgia: Secondary | ICD-10-CM | POA: Diagnosis present

## 2022-07-28 DIAGNOSIS — Z833 Family history of diabetes mellitus: Secondary | ICD-10-CM | POA: Diagnosis not present

## 2022-07-28 DIAGNOSIS — Z90710 Acquired absence of both cervix and uterus: Secondary | ICD-10-CM

## 2022-07-28 DIAGNOSIS — E119 Type 2 diabetes mellitus without complications: Secondary | ICD-10-CM | POA: Diagnosis present

## 2022-07-28 DIAGNOSIS — Z800 Family history of malignant neoplasm of digestive organs: Secondary | ICD-10-CM

## 2022-07-28 DIAGNOSIS — Z9861 Coronary angioplasty status: Secondary | ICD-10-CM

## 2022-07-28 DIAGNOSIS — I249 Acute ischemic heart disease, unspecified: Secondary | ICD-10-CM | POA: Diagnosis not present

## 2022-07-28 DIAGNOSIS — E8881 Metabolic syndrome: Secondary | ICD-10-CM | POA: Diagnosis present

## 2022-07-28 DIAGNOSIS — E785 Hyperlipidemia, unspecified: Secondary | ICD-10-CM | POA: Diagnosis not present

## 2022-07-28 DIAGNOSIS — I2510 Atherosclerotic heart disease of native coronary artery without angina pectoris: Secondary | ICD-10-CM | POA: Diagnosis present

## 2022-07-28 DIAGNOSIS — Z83511 Family history of glaucoma: Secondary | ICD-10-CM

## 2022-07-28 DIAGNOSIS — Z7902 Long term (current) use of antithrombotics/antiplatelets: Secondary | ICD-10-CM

## 2022-07-28 DIAGNOSIS — H409 Unspecified glaucoma: Secondary | ICD-10-CM | POA: Diagnosis present

## 2022-07-28 DIAGNOSIS — I10 Essential (primary) hypertension: Secondary | ICD-10-CM | POA: Diagnosis not present

## 2022-07-28 DIAGNOSIS — R519 Headache, unspecified: Secondary | ICD-10-CM | POA: Diagnosis not present

## 2022-07-28 DIAGNOSIS — K219 Gastro-esophageal reflux disease without esophagitis: Secondary | ICD-10-CM | POA: Diagnosis present

## 2022-07-28 DIAGNOSIS — Z96652 Presence of left artificial knee joint: Secondary | ICD-10-CM | POA: Diagnosis present

## 2022-07-28 DIAGNOSIS — E782 Mixed hyperlipidemia: Secondary | ICD-10-CM | POA: Diagnosis present

## 2022-07-28 DIAGNOSIS — R079 Chest pain, unspecified: Secondary | ICD-10-CM | POA: Diagnosis not present

## 2022-07-28 DIAGNOSIS — I213 ST elevation (STEMI) myocardial infarction of unspecified site: Secondary | ICD-10-CM | POA: Diagnosis not present

## 2022-07-28 DIAGNOSIS — I2111 ST elevation (STEMI) myocardial infarction involving right coronary artery: Secondary | ICD-10-CM | POA: Diagnosis not present

## 2022-07-28 DIAGNOSIS — R072 Precordial pain: Secondary | ICD-10-CM | POA: Diagnosis not present

## 2022-07-28 DIAGNOSIS — I25118 Atherosclerotic heart disease of native coronary artery with other forms of angina pectoris: Secondary | ICD-10-CM | POA: Diagnosis not present

## 2022-07-28 DIAGNOSIS — Z8679 Personal history of other diseases of the circulatory system: Secondary | ICD-10-CM | POA: Diagnosis not present

## 2022-07-28 DIAGNOSIS — Z8249 Family history of ischemic heart disease and other diseases of the circulatory system: Secondary | ICD-10-CM | POA: Diagnosis not present

## 2022-07-28 DIAGNOSIS — Z79899 Other long term (current) drug therapy: Secondary | ICD-10-CM | POA: Diagnosis not present

## 2022-07-28 LAB — BASIC METABOLIC PANEL
Anion gap: 12 (ref 5–15)
BUN: 23 mg/dL (ref 8–23)
CO2: 25 mmol/L (ref 22–32)
Calcium: 9.3 mg/dL (ref 8.9–10.3)
Chloride: 100 mmol/L (ref 98–111)
Creatinine, Ser: 1.24 mg/dL — ABNORMAL HIGH (ref 0.44–1.00)
GFR, Estimated: 43 mL/min — ABNORMAL LOW (ref >60)
Glucose, Bld: 134 mg/dL — ABNORMAL HIGH (ref 70–99)
Potassium: 4.3 mmol/L (ref 3.5–5.1)
Sodium: 137 mmol/L (ref 135–145)

## 2022-07-28 LAB — CBC
HCT: 41.1 % (ref 36.0–46.0)
Hemoglobin: 14 g/dL (ref 12.0–15.0)
MCH: 35.1 pg — ABNORMAL HIGH (ref 26.0–34.0)
MCHC: 34.1 g/dL (ref 30.0–36.0)
MCV: 103 fL — ABNORMAL HIGH (ref 80.0–100.0)
Platelets: 198 10*3/uL (ref 150–400)
RBC: 3.99 MIL/uL (ref 3.87–5.11)
RDW: 12.7 % (ref 11.5–15.5)
WBC: 6.7 10*3/uL (ref 4.0–10.5)
nRBC: 0 % (ref 0.0–0.2)

## 2022-07-28 LAB — TROPONIN I (HIGH SENSITIVITY): Troponin I (High Sensitivity): 8901 ng/L (ref <18)

## 2022-07-28 NOTE — ED Triage Notes (Signed)
Pt c/o bilateral chest pain described as pressure since last night. Endorses shob at baseline but denies any worsening shob with chest pain.

## 2022-07-29 ENCOUNTER — Other Ambulatory Visit (HOSPITAL_COMMUNITY): Payer: Self-pay

## 2022-07-29 ENCOUNTER — Encounter (HOSPITAL_COMMUNITY): Payer: Self-pay | Admitting: Cardiovascular Disease

## 2022-07-29 ENCOUNTER — Encounter (HOSPITAL_COMMUNITY)
Admission: EM | Disposition: A | Discharge status: Home / Self Care | Payer: Self-pay | Source: Home / Self Care | Attending: Cardiovascular Disease

## 2022-07-29 DIAGNOSIS — Z800 Family history of malignant neoplasm of digestive organs: Secondary | ICD-10-CM | POA: Diagnosis not present

## 2022-07-29 DIAGNOSIS — Z83511 Family history of glaucoma: Secondary | ICD-10-CM | POA: Diagnosis not present

## 2022-07-29 DIAGNOSIS — I25118 Atherosclerotic heart disease of native coronary artery with other forms of angina pectoris: Secondary | ICD-10-CM | POA: Diagnosis not present

## 2022-07-29 DIAGNOSIS — M797 Fibromyalgia: Secondary | ICD-10-CM | POA: Diagnosis present

## 2022-07-29 DIAGNOSIS — I2111 ST elevation (STEMI) myocardial infarction involving right coronary artery: Secondary | ICD-10-CM | POA: Diagnosis present

## 2022-07-29 DIAGNOSIS — I249 Acute ischemic heart disease, unspecified: Secondary | ICD-10-CM | POA: Diagnosis not present

## 2022-07-29 DIAGNOSIS — E782 Mixed hyperlipidemia: Secondary | ICD-10-CM | POA: Diagnosis present

## 2022-07-29 DIAGNOSIS — Z801 Family history of malignant neoplasm of trachea, bronchus and lung: Secondary | ICD-10-CM | POA: Diagnosis not present

## 2022-07-29 DIAGNOSIS — Z7982 Long term (current) use of aspirin: Secondary | ICD-10-CM | POA: Diagnosis not present

## 2022-07-29 DIAGNOSIS — Z8679 Personal history of other diseases of the circulatory system: Secondary | ICD-10-CM | POA: Diagnosis not present

## 2022-07-29 DIAGNOSIS — E785 Hyperlipidemia, unspecified: Secondary | ICD-10-CM | POA: Diagnosis not present

## 2022-07-29 DIAGNOSIS — R079 Chest pain, unspecified: Secondary | ICD-10-CM | POA: Diagnosis not present

## 2022-07-29 DIAGNOSIS — K30 Functional dyspepsia: Secondary | ICD-10-CM | POA: Diagnosis present

## 2022-07-29 DIAGNOSIS — Z90710 Acquired absence of both cervix and uterus: Secondary | ICD-10-CM | POA: Diagnosis not present

## 2022-07-29 DIAGNOSIS — I2510 Atherosclerotic heart disease of native coronary artery without angina pectoris: Secondary | ICD-10-CM

## 2022-07-29 DIAGNOSIS — Z8249 Family history of ischemic heart disease and other diseases of the circulatory system: Secondary | ICD-10-CM | POA: Diagnosis not present

## 2022-07-29 DIAGNOSIS — R072 Precordial pain: Secondary | ICD-10-CM | POA: Diagnosis not present

## 2022-07-29 DIAGNOSIS — I213 ST elevation (STEMI) myocardial infarction of unspecified site: Secondary | ICD-10-CM | POA: Diagnosis present

## 2022-07-29 DIAGNOSIS — Z79899 Other long term (current) drug therapy: Secondary | ICD-10-CM | POA: Diagnosis not present

## 2022-07-29 DIAGNOSIS — Z833 Family history of diabetes mellitus: Secondary | ICD-10-CM | POA: Diagnosis not present

## 2022-07-29 DIAGNOSIS — E8881 Metabolic syndrome: Secondary | ICD-10-CM | POA: Diagnosis present

## 2022-07-29 DIAGNOSIS — R519 Headache, unspecified: Secondary | ICD-10-CM | POA: Diagnosis not present

## 2022-07-29 DIAGNOSIS — E876 Hypokalemia: Secondary | ICD-10-CM | POA: Diagnosis present

## 2022-07-29 DIAGNOSIS — Z7902 Long term (current) use of antithrombotics/antiplatelets: Secondary | ICD-10-CM | POA: Diagnosis not present

## 2022-07-29 DIAGNOSIS — H409 Unspecified glaucoma: Secondary | ICD-10-CM | POA: Diagnosis present

## 2022-07-29 DIAGNOSIS — E119 Type 2 diabetes mellitus without complications: Secondary | ICD-10-CM | POA: Diagnosis present

## 2022-07-29 DIAGNOSIS — K219 Gastro-esophageal reflux disease without esophagitis: Secondary | ICD-10-CM | POA: Diagnosis present

## 2022-07-29 DIAGNOSIS — Z96652 Presence of left artificial knee joint: Secondary | ICD-10-CM | POA: Diagnosis present

## 2022-07-29 DIAGNOSIS — I10 Essential (primary) hypertension: Secondary | ICD-10-CM | POA: Diagnosis present

## 2022-07-29 HISTORY — PX: LEFT HEART CATH AND CORONARY ANGIOGRAPHY: CATH118249

## 2022-07-29 HISTORY — PX: CORONARY/GRAFT ACUTE MI REVASCULARIZATION: CATH118305

## 2022-07-29 HISTORY — DX: ST elevation (STEMI) myocardial infarction involving right coronary artery: I21.11

## 2022-07-29 LAB — MRSA NEXT GEN BY PCR, NASAL: MRSA by PCR Next Gen: NOT DETECTED

## 2022-07-29 LAB — CBC
HCT: 35.9 % — ABNORMAL LOW (ref 36.0–46.0)
Hemoglobin: 12.3 g/dL (ref 12.0–15.0)
MCH: 34.7 pg — ABNORMAL HIGH (ref 26.0–34.0)
MCHC: 34.3 g/dL (ref 30.0–36.0)
MCV: 101.4 fL — ABNORMAL HIGH (ref 80.0–100.0)
Platelets: 172 10*3/uL (ref 150–400)
RBC: 3.54 MIL/uL — ABNORMAL LOW (ref 3.87–5.11)
RDW: 12.9 % (ref 11.5–15.5)
WBC: 6.3 10*3/uL (ref 4.0–10.5)
nRBC: 0 % (ref 0.0–0.2)

## 2022-07-29 LAB — BASIC METABOLIC PANEL
Anion gap: 10 (ref 5–15)
BUN: 22 mg/dL (ref 8–23)
CO2: 24 mmol/L (ref 22–32)
Calcium: 8.6 mg/dL — ABNORMAL LOW (ref 8.9–10.3)
Chloride: 103 mmol/L (ref 98–111)
Creatinine, Ser: 0.94 mg/dL (ref 0.44–1.00)
GFR, Estimated: >60 mL/min (ref >60)
Glucose, Bld: 135 mg/dL — ABNORMAL HIGH (ref 70–99)
Potassium: 3.5 mmol/L (ref 3.5–5.1)
Sodium: 137 mmol/L (ref 135–145)

## 2022-07-29 LAB — POCT ACTIVATED CLOTTING TIME
Activated Clotting Time: 271 seconds
Activated Clotting Time: 363 seconds

## 2022-07-29 LAB — TROPONIN I (HIGH SENSITIVITY)
Troponin I (High Sensitivity): 9078 ng/L (ref <18)
Troponin I (High Sensitivity): 15064 ng/L (ref <18)

## 2022-07-29 SURGERY — CORONARY/GRAFT ACUTE MI REVASCULARIZATION
Anesthesia: LOCAL

## 2022-07-29 MED ORDER — ONDANSETRON HCL 4 MG/2ML IJ SOLN: 4.0000 mg | Freq: Four times a day (QID) | INTRAMUSCULAR | Status: DC | PRN

## 2022-07-29 MED ORDER — SODIUM CHLORIDE 0.9 % IV SOLN
INTRAVENOUS | Status: AC | PRN
  Administered 2022-07-29: 10 mL/h via INTRAVENOUS

## 2022-07-29 MED ORDER — TICAGRELOR 90 MG PO TABS
90.0000 mg | ORAL_TABLET | Freq: Two times a day (BID) | ORAL | Status: DC
  Administered 2022-07-29 – 2022-08-01 (×7): 90 mg via ORAL
  Filled 2022-07-29 (×7): qty 1

## 2022-07-29 MED ORDER — SODIUM CHLORIDE 0.9 % IV SOLN: INTRAVENOUS | Status: DC

## 2022-07-29 MED ORDER — LIDOCAINE HCL (PF) 1 % IJ SOLN
INTRAMUSCULAR | Status: DC | PRN
  Administered 2022-07-29: 2 mL

## 2022-07-29 MED ORDER — ASPIRIN 81 MG PO CHEW
324.0000 mg | CHEWABLE_TABLET | Freq: Once | ORAL | Status: AC
  Administered 2022-07-29: 324 mg via ORAL
  Filled 2022-07-29: qty 4

## 2022-07-29 MED ORDER — HEPARIN (PORCINE) IN NACL 1000-0.9 UT/500ML-% IV SOLN
INTRAVENOUS | Status: DC | PRN
  Administered 2022-07-29 (×2): 500 mL

## 2022-07-29 MED ORDER — VERAPAMIL HCL 2.5 MG/ML IV SOLN
INTRAVENOUS | Status: DC | PRN
  Administered 2022-07-29: 10 mL via INTRA_ARTERIAL

## 2022-07-29 MED ORDER — TICAGRELOR 90 MG PO TABS
ORAL_TABLET | ORAL | Status: DC | PRN
  Administered 2022-07-29: 180 mg via ORAL

## 2022-07-29 MED ORDER — HEPARIN SODIUM (PORCINE) 5000 UNIT/ML IJ SOLN
4000.0000 [IU] | Freq: Once | INTRAMUSCULAR | Status: AC
  Administered 2022-07-29: 4000 [IU] via INTRAVENOUS
  Filled 2022-07-29: qty 1

## 2022-07-29 MED ORDER — NITROGLYCERIN IN D5W 200-5 MCG/ML-% IV SOLN
INTRAVENOUS | Status: AC
  Filled 2022-07-29: qty 250

## 2022-07-29 MED ORDER — CHLORHEXIDINE GLUCONATE CLOTH 2 % EX PADS
6.0000 | MEDICATED_PAD | Freq: Every day | CUTANEOUS | Status: DC
  Administered 2022-07-29 – 2022-07-30 (×3): 6 via TOPICAL

## 2022-07-29 MED ORDER — FENTANYL CITRATE (PF) 100 MCG/2ML IJ SOLN
INTRAMUSCULAR | Status: AC
  Filled 2022-07-29: qty 2

## 2022-07-29 MED ORDER — HEPARIN (PORCINE) 25000 UT/250ML-% IV SOLN: 750.0000 [IU]/h | INTRAVENOUS | Status: DC

## 2022-07-29 MED ORDER — ASPIRIN 81 MG PO CHEW
81.0000 mg | CHEWABLE_TABLET | Freq: Every day | ORAL | Status: DC
  Administered 2022-07-29 – 2022-08-01 (×4): 81 mg via ORAL
  Filled 2022-07-29 (×4): qty 1

## 2022-07-29 MED ORDER — HEPARIN SODIUM (PORCINE) 1000 UNIT/ML IJ SOLN
INTRAMUSCULAR | Status: AC
  Filled 2022-07-29: qty 10

## 2022-07-29 MED ORDER — IOHEXOL 350 MG/ML SOLN
INTRAVENOUS | Status: DC | PRN
  Administered 2022-07-29: 170 mL via INTRA_ARTERIAL

## 2022-07-29 MED ORDER — SODIUM CHLORIDE 0.9% FLUSH: 3.0000 mL | INTRAVENOUS | Status: DC | PRN

## 2022-07-29 MED ORDER — MIDAZOLAM HCL 2 MG/2ML IJ SOLN
INTRAMUSCULAR | Status: AC
  Filled 2022-07-29: qty 2

## 2022-07-29 MED ORDER — LIDOCAINE HCL (PF) 1 % IJ SOLN
INTRAMUSCULAR | Status: AC
  Filled 2022-07-29: qty 30

## 2022-07-29 MED ORDER — VERAPAMIL HCL 2.5 MG/ML IV SOLN
INTRAVENOUS | Status: AC
  Filled 2022-07-29: qty 2

## 2022-07-29 MED ORDER — SODIUM CHLORIDE 0.9% FLUSH
3.0000 mL | Freq: Two times a day (BID) | INTRAVENOUS | Status: DC
  Administered 2022-07-29 – 2022-08-01 (×7): 3 mL via INTRAVENOUS

## 2022-07-29 MED ORDER — FENTANYL CITRATE (PF) 100 MCG/2ML IJ SOLN
INTRAMUSCULAR | Status: DC | PRN
  Administered 2022-07-29: 25 ug via INTRAVENOUS

## 2022-07-29 MED ORDER — MORPHINE SULFATE (PF) 4 MG/ML IV SOLN
4.0000 mg | Freq: Once | INTRAVENOUS | Status: AC
  Administered 2022-07-29: 4 mg via INTRAVENOUS
  Filled 2022-07-29: qty 1

## 2022-07-29 MED ORDER — TICAGRELOR 90 MG PO TABS
ORAL_TABLET | ORAL | Status: AC
  Filled 2022-07-29: qty 2

## 2022-07-29 MED ORDER — LATANOPROST 0.005 % OP SOLN
1.0000 [drp] | Freq: Every day | OPHTHALMIC | Status: DC
  Administered 2022-07-29 – 2022-07-31 (×3): 1 [drp] via OPHTHALMIC
  Filled 2022-07-29: qty 2.5

## 2022-07-29 MED ORDER — LABETALOL HCL 5 MG/ML IV SOLN: 10.0000 mg | INTRAVENOUS | Status: AC | PRN

## 2022-07-29 MED ORDER — ATORVASTATIN CALCIUM 80 MG PO TABS
80.0000 mg | ORAL_TABLET | Freq: Every day | ORAL | Status: DC
  Administered 2022-07-29 – 2022-08-01 (×4): 80 mg via ORAL
  Filled 2022-07-29 (×4): qty 1

## 2022-07-29 MED ORDER — HEPARIN SODIUM (PORCINE) 1000 UNIT/ML IJ SOLN
INTRAMUSCULAR | Status: DC | PRN
  Administered 2022-07-29: 2000 [IU] via INTRAVENOUS
  Administered 2022-07-29: 4000 [IU] via INTRAVENOUS

## 2022-07-29 MED ORDER — AMLODIPINE BESYLATE 5 MG PO TABS
5.0000 mg | ORAL_TABLET | Freq: Every day | ORAL | Status: DC
  Administered 2022-07-29 – 2022-07-30 (×2): 5 mg via ORAL
  Filled 2022-07-29 (×2): qty 1

## 2022-07-29 MED ORDER — DORZOLAMIDE HCL-TIMOLOL MAL 2-0.5 % OP SOLN
1.0000 [drp] | Freq: Two times a day (BID) | OPHTHALMIC | Status: DC
  Administered 2022-07-29 – 2022-08-01 (×7): 1 [drp] via OPHTHALMIC
  Filled 2022-07-29 (×2): qty 10

## 2022-07-29 MED ORDER — NITROGLYCERIN IN D5W 200-5 MCG/ML-% IV SOLN
INTRAVENOUS | Status: AC | PRN
  Administered 2022-07-29: 10 ug/min via INTRAVENOUS

## 2022-07-29 MED ORDER — NITROGLYCERIN 1 MG/10 ML FOR IR/CATH LAB
INTRA_ARTERIAL | Status: DC | PRN
  Administered 2022-07-29: 200 ug via INTRACORONARY

## 2022-07-29 MED ORDER — ONDANSETRON HCL 4 MG/2ML IJ SOLN
4.0000 mg | Freq: Once | INTRAMUSCULAR | Status: AC
  Administered 2022-07-29: 4 mg via INTRAVENOUS
  Filled 2022-07-29: qty 2

## 2022-07-29 MED ORDER — HYALURONIDASE HUMAN 150 UNIT/ML IJ SOLN
150.0000 [IU] | Freq: Once | INTRAMUSCULAR | Status: AC
  Administered 2022-07-29: 150 [IU] via SUBCUTANEOUS
  Filled 2022-07-29: qty 1

## 2022-07-29 MED ORDER — NITROGLYCERIN 1 MG/10 ML FOR IR/CATH LAB
INTRA_ARTERIAL | Status: AC
  Filled 2022-07-29: qty 10

## 2022-07-29 MED ORDER — DIAZEPAM 5 MG PO TABS
5.0000 mg | ORAL_TABLET | Freq: Four times a day (QID) | ORAL | Status: DC | PRN
  Administered 2022-07-31: 5 mg via ORAL
  Filled 2022-07-29 (×2): qty 1

## 2022-07-29 MED ORDER — SODIUM CHLORIDE 0.9 % IV SOLN: 250.0000 mL | INTRAVENOUS | Status: DC | PRN

## 2022-07-29 MED ORDER — NITROGLYCERIN IN D5W 200-5 MCG/ML-% IV SOLN: 0.0000 ug/min | INTRAVENOUS | Status: DC

## 2022-07-29 MED ORDER — ACETAMINOPHEN 325 MG PO TABS
650.0000 mg | ORAL_TABLET | ORAL | Status: DC | PRN
  Administered 2022-07-30 – 2022-07-31 (×3): 650 mg via ORAL
  Filled 2022-07-29 (×3): qty 2

## 2022-07-29 MED ORDER — MIDAZOLAM HCL 2 MG/2ML IJ SOLN
INTRAMUSCULAR | Status: DC | PRN
  Administered 2022-07-29: 1 mg via INTRAVENOUS

## 2022-07-29 MED ORDER — HYDRALAZINE HCL 20 MG/ML IJ SOLN: 10.0000 mg | INTRAMUSCULAR | Status: AC | PRN

## 2022-07-29 SURGICAL SUPPLY — 17 items
BALLN EMERGE MR 2.0X12 (BALLOONS) ×1
BALLOON EMERGE MR 2.0X12 (BALLOONS) IMPLANT
CATH OPTITORQUE TIG 4.0 6F (CATHETERS) IMPLANT
CATH VISTA GUIDE 6FR JR4 (CATHETERS) IMPLANT
DEVICE RAD COMP TR BAND LRG (VASCULAR PRODUCTS) IMPLANT
ELECT DEFIB PAD ADLT CADENCE (PAD) IMPLANT
GLIDESHEATH SLEND SS 6F .021 (SHEATH) IMPLANT
GUIDEWIRE INQWIRE 1.5J.035X260 (WIRE) IMPLANT
INQWIRE 1.5J .035X260CM (WIRE) ×1
KIT ENCORE 26 ADVANTAGE (KITS) IMPLANT
KIT HEART LEFT (KITS) ×1 IMPLANT
PACK CARDIAC CATHETERIZATION (CUSTOM PROCEDURE TRAY) ×1 IMPLANT
SHEATH PROBE COVER 6X72 (BAG) IMPLANT
TRANSDUCER W/STOPCOCK (MISCELLANEOUS) ×1 IMPLANT
TUBING CIL FLEX 10 FLL-RA (TUBING) ×1 IMPLANT
WIRE COUGAR XT STRL 190CM (WIRE) IMPLANT
WIRE HI TORQ VERSACORE-J 145CM (WIRE) IMPLANT

## 2022-07-29 NOTE — Progress Notes (Signed)
   07/29/22 0000  Spiritual Encounters  Type of Visit Initial  Care provided to: Patient  Referral source Trauma page  Reason for visit Trauma  OnCall Visit Yes   Ch responded to trauma page. Family was at bedside. Pt declined. No follow-up needed.

## 2022-07-29 NOTE — H&P (Signed)
Cardiology Admission History and Physical   Patient ID: Cassandra Brooks MRN: [id number]; DOB: 05/08/1940   Admission date: 07/28/2022  PCP:  Hurshel Party, NP   [facility] Providers Cardiologist:  Garwin Brothers, MD        Chief Complaint:  CP/STEMI  Patient Profile:   Cassandra Brooks is a 82 y.o. female with no prior cardiac hx who is being seen 07/29/2022 for the evaluation of CP/ST elevation on EKG.  History of Present Illness:   [name] has multiple cardiac risk factors including HTN, HLD, DM but no prior personal cardiac hx, s/p AAA repair, presenting to the ED tonight c/o chest discomfort fo rthe past 2 days. She says she first started noticing pressure/pain in her chest the day prior to presentation. She just dismissed it as gas or indigestion. It went away during the night, but returned this morning and has been occurring intermittently all day today, up to 5/10 in intensity. No associated nausea, diaphoresis, or worsening SOB/DOE. She was found in ED to have ST elevation on EKG with HST >8000.   Past Medical History:  Diagnosis Date   AAA (abdominal aortic aneurysm)    Arthritis    "knees" (03/21/2017)   Borderline diabetes    Bulging lumbar disc    Depression    Disequilibrium    Dizziness    Essential hypertension 12/19/2019   Fall    Fibromyalgia    GERD (gastroesophageal reflux disease)    Glaucoma, both eyes    Hemoptysis 03/18/2015   High cholesterol    "don't have it but I take RX" (03/21/2017)   Hypertension    Mixed dyslipidemia 12/19/2019   Nuclear cataract of both eyes 01/01/2014   Pre-diabetes    pt states she is not diabetic. Last A1C was normal. Pt is on no medications for DM   Preoperative cardiovascular examination 12/19/2019   Primary osteoarthritis of left knee 07/18/2019   Solitary pulmonary nodule 03/18/2015   03/2015 6mm RLL nodule    Status post AAA (abdominal aortic aneurysm) repair 12/19/2019   Status post  total left knee replacement 03/27/2020   Syncope 03/21/2017   Syncope and collapse     Past Surgical History:  Procedure Laterality Date   ABDOMINAL AORTIC ANEURYSM REPAIR  1966   APPENDECTOMY  1966   CARDIAC CATHETERIZATION     20+ years ago, no abnormalities found   COLONOSCOPY     LUMBAR LAMINECTOMY/DECOMPRESSION MICRODISCECTOMY N/A 04/26/2021   Procedure: CENTRAL LAMINECTOMIES LUMBAR TWO-THREE,  LUMBAR THREE-FOUR WITH BILATERAL FORAMINOTOMIES;  Surgeon: Kerrin Champagne, MD;  Location: [facility] OR;  Service: Orthopedics;  Laterality: N/A;   TOTAL KNEE ARTHROPLASTY Left 03/27/2020   Procedure: LEFT TOTAL KNEE ARTHROPLASTY;  Surgeon: Tarry Kos, MD;  Location: [facility] OR;  Service: Orthopedics;  Laterality: Left;   VAGINAL HYSTERECTOMY     "partial"   VIDEO BRONCHOSCOPY Bilateral 03/19/2015   Procedure: VIDEO BRONCHOSCOPY WITHOUT FLUORO;  Surgeon: Oretha Milch, MD;  Location: [facility];  Service: Cardiopulmonary;  Laterality: Bilateral;     Medications Prior to Admission: Prior to Admission medications   Medication Sig Start Date End Date Taking? Authorizing Provider  acetaminophen (TYLENOL) 500 MG tablet Take 1,000 mg by mouth every 6 (six) hours as needed for mild pain.    [provider]  amLODipine (NORVASC) 10 MG tablet Take 10 mg by mouth daily.    [provider]  amoxicillin (AMOXIL) 500 MG capsule Take 4 pills one hour  prior to dental work 03/18/22   Cristie Hem, PA-C  amoxicillin (AMOXIL) 500 MG tablet take 4 pills one hour prior to dental work 05/17/21   Cristie Hem, PA-C  aspirin EC 81 MG tablet Take 81 mg by mouth daily. Swallow whole.    [provider]  diclofenac (VOLTAREN) 75 MG EC tablet Take 1 tablet (75 mg total) by mouth 2 (two) times daily as needed. 10/19/21   Cristie Hem, PA-C  diclofenac Sodium (VOLTAREN) 1 % GEL Apply 2 g topically 4 (four) times daily. 10/19/21   Cristie Hem, PA-C  docusate sodium (COLACE) 100 MG capsule  Take 1 capsule (100 mg total) by mouth 2 (two) times daily. 04/27/21   Kerrin Champagne, MD  dorzolamide-timolol (COSOPT) 22.3-6.8 MG/ML ophthalmic solution Place 1 drop into both eyes 2 (two) times daily.    [provider]  fluticasone (FLONASE) 50 MCG/ACT nasal spray Place 2 sprays into both nostrils 2 (two) times daily. 11/08/19   [provider]  gabapentin (NEURONTIN) 100 MG capsule Take 1 capsule (100 mg total) by mouth 3 (three) times daily. 04/27/21   Kerrin Champagne, MD  hydrochlorothiazide (HYDRODIURIL) 12.5 MG tablet Take 12.5 mg by mouth daily. 01/05/21   [provider]  HYDROcodone-acetaminophen (NORCO) 7.5-325 MG tablet Take 1 tablet by mouth every 6 (six) hours as needed for moderate pain ((score 4 to 6)). 04/27/21   Kerrin Champagne, MD  LUMIGAN 0.01 % SOLN Place 1 drop into both eyes at bedtime. 12/12/19   [provider]  meclizine (ANTIVERT) 25 MG tablet Take 1 tablet (25 mg total) by mouth 3 (three) times daily as needed for dizziness. 10/08/20   Fayrene Helper, PA-C  methocarbamol (ROBAXIN) 500 MG tablet Take 1 tablet (500 mg total) by mouth every 6 (six) hours as needed for muscle spasms. 04/27/21   Kerrin Champagne, MD  methocarbamol (ROBAXIN) 500 MG tablet Take 1 tablet (500 mg total) by mouth every 8 (eight) hours as needed for muscle spasms. 05/07/21   Naida Sleight, PA-C  nabumetone (RELAFEN) 750 MG tablet Take 750 mg by mouth 2 (two) times daily. 02/21/17   [provider]  omeprazole (PRILOSEC) 20 MG capsule Take 20 mg by mouth daily.    [provider]  pravastatin (PRAVACHOL) 20 MG tablet Take 20 mg by mouth at bedtime.    [provider]  traMADol-acetaminophen (ULTRACET) 37.5-325 MG tablet Take 1 tablet by mouth every 6 (six) hours as needed. 07/15/21   Kerrin Champagne, MD  venlafaxine XR (EFFEXOR-XR) 150 MG 24 hr capsule Take 1 capsule (150 mg total) by mouth daily with breakfast. 03/28/17   Oralia Manis, DO      Allergies:   No Known Allergies  Social History:   Social History   Socioeconomic History   Marital status: Married    Spouse name: Not on file   Number of children: Not on file   Years of education: Not on file   Highest education level: Not on file  Occupational History   Occupation: retired    Comment: Designer, fashion/clothing; dietician  Tobacco Use   Smoking status: Never   Smokeless tobacco: Never  Vaping Use   Vaping Use: Never used  Substance and Sexual Activity   Alcohol use: No   Drug use: No   Sexual activity: Not on file  Other Topics Concern   Not on file  Social History Narrative   Not on file  Social Determinants of Health   Financial Resource Strain: Not on file  Food Insecurity: Not on file  Transportation Needs: Not on file  Physical Activity: Not on file  Stress: Not on file  Social Connections: Not on file  Intimate Partner Violence: Not on file    Family History:   The patient's family history includes Colon cancer in her brother; Diabetes in her brother, brother, mother, and sister; Glaucoma in her mother; Hypertension in her father; Lung cancer in her brother; Lupus in her child; Stomach cancer in her father.    ROS:  Please see the history of present illness.  All other ROS reviewed and negative.     Physical Exam/Data:   Vitals:   07/29/22 0015 07/29/22 0030 07/29/22 0045 07/29/22 0100  BP: (!) 167/80 (!) 159/67 (!) 160/84 (!) 172/71  Pulse: 90 79 87 72  Resp: 17 (!) 27 19 (!) 22  Temp:      SpO2: 99% 97% 99% 97%  Weight:      Height:       No intake or output data in the 24 hours ending 07/29/22 0125    07/28/2022   10:57 PM 11/22/2021    7:47 AM 07/15/2021    8:43 AM  Last 3 Weights  Weight (lbs) 165 lb 175 lb 175 lb  Weight (kg) 74.844 kg 79.379 kg 79.379 kg     Body mass index is 31.18 kg/m.  General:  Well nourished, well developed, in no acute distress HEENT: normal Neck: no JVD Vascular: No carotid bruits   Cardiac:  normal S1,  S2; RRR; no murmur  Lungs:  clear to auscultation bilaterally, no wheezing, rhonchi or rales  Abd: soft, nontender, no hepatomegaly  Ext: no edema Musculoskeletal:  No deformities Skin: warm and dry  Neuro:  no focal abnormalities noted Psych:  Normal affect    EKG:  The ECG that was done 07-29-22 was personally reviewed and demonstrates NSR with 1-60mm subtle ST elevation in the inferior leads  Relevant CV Studies: TTE 03-22-17 Left ventricle: The cavity size was normal. Wall thickness was    increased in a pattern of moderate LVH. Systolic function was    normal. The estimated ejection fraction was in the range of 60%    to 65%. Wall motion was normal; there were no regional wall    motion abnormalities. Doppler parameters are consistent with    abnormal left ventricular relaxation (grade 1 diastolic    dysfunction). The E/e&' ratio is between 8-15, suggesting    indeterminate LV filling pressure.  - Mitral valve: Mildly thickened leaflets . There was mild    regurgitation.  - Left atrium: The atrium was normal in size.  - Tricuspid valve: There was mild regurgitation.  - Pulmonary arteries: PA peak pressure: 42 mm Hg (S).  - Inferior vena cava: The vessel was normal in size. The    respirophasic diameter changes were in the normal range (>= 50%),    consistent with normal central venous pressure.   Impressions:   - LVEF 60-65%, moderate LVH, normal wall motion, grade 1 DD,    indeterminate LV filling pressure, mild MR, normal LA size, mild    TR, RVSP 42 mmHg, normal IVC.   Laboratory Data:  High Sensitivity Troponin:   Recent Labs  Lab 07/28/22 2257  TROPONINIHS 8,901*      Chemistry Recent Labs  Lab 07/28/22 2257  NA 137  K 4.3  CL 100  CO2 25  GLUCOSE 134*  BUN 23  CREATININE 1.24*  CALCIUM 9.3  GFRNONAA 43*  ANIONGAP 12    No results for input(s): "PROT", "ALBUMIN", "AST", "ALT", "ALKPHOS", "BILITOT" in the last 168 hours. Lipids No results for  input(s): "CHOL", "TRIG", "HDL", "LABVLDL", "LDLCALC", "CHOLHDL" in the last 168 hours. Hematology Recent Labs  Lab 07/28/22 2257  WBC 6.7  RBC 3.99  HGB 14.0  HCT 41.1  MCV 103.0*  MCH 35.1*  MCHC 34.1  RDW 12.7  PLT 198   Thyroid No results for input(s): "TSH", "FREET4" in the last 168 hours. BNPNo results for input(s): "BNP", "PROBNP" in the last 168 hours.  DDimer No results for input(s): "DDIMER" in the last 168 hours.   Radiology/Studies:  DG Chest 2 View  Result Date: 07/28/2022 CLINICAL DATA:  Chest pain and shortness of breath. EXAM: CHEST - 2 VIEW COMPARISON:  PA Lat 04/22/2021 FINDINGS: Heart size and central vasculature are normal. There is calcification of the transverse aorta. The mediastinum is normally outlined. The lungs again show mild increased interstitial markings but no focal pneumonic infiltrate. No pleural effusion is seen. Osteopenia. Comparison to the prior study reveals no significant interval change. IMPRESSION: No acute cardiopulmonary disease. Mild increased interstitial markings unchanged over the previous exam. Aortic atherosclerosis. Electronically Signed   By: Almira Bar M.D.   On: 07/28/2022 23:23     Assessment and Plan:   CP/STEMI: will plan for urgent LHC for further evaluation. HgbA1c, FLP, TSH for screening.   2. HTN/HLD: cont home med regimen and adjust as needed   Risk Assessment/Risk Scores:    TIMI Risk Score for ST  Elevation MI:   The patient's TIMI risk score is 4, which indicates a 7.3% risk of all cause mortality at 30 days.        Severity of Illness: The appropriate patient status for this patient is INPATIENT. Inpatient status is judged to be reasonable and necessary in order to provide the required intensity of service to ensure the patient's safety. The patient's presenting symptoms, physical exam findings, and initial radiographic and laboratory data in the context of their chronic comorbidities is felt to place them  at high risk for further clinical deterioration. Furthermore, it is not anticipated that the patient will be medically stable for discharge from the hospital within 2 midnights of admission.   * I certify that at the point of admission it is my clinical judgment that the patient will require inpatient hospital care spanning beyond 2 midnights from the point of admission due to high intensity of service, high risk for further deterioration and high frequency of surveillance required.*   For questions or updates, please contact [facility] Please consult [email or web address] for contact info under     Signed, Precious Reel, MD, [facility]  07/29/2022 1:25 AM

## 2022-07-29 NOTE — TOC Benefit Eligibility Note (Signed)
Patient Product/process development scientist completed.    The patient is currently admitted and upon discharge could be taking Brilinta 90 mg.  The current 30 day co-pay is $11.20.   The patient is insured through [employer]   This test claim was processed through [facility]- copay amounts may vary at other pharmacies due to pharmacy/plan contracts, or as the patient moves through the different stages of their insurance plan.  Roland Earl, CPHT Pharmacy Patient Advocate Specialist [facility] Patient Advocate Team Direct Number: [phone]  Fax: [phone]

## 2022-07-29 NOTE — Progress Notes (Signed)
ANTICOAGULATION CONSULT NOTE - Initial Consult  Pharmacy Consult for Heparin infusion Indication: chest pain/ACS  No Known Allergies  Patient Measurements: Height:  (154.9 cm) Weight: 74.8 kg (165 lb) IBW/kg (Calculated) : 47.8 Heparin Dosing Weight: 64.3 kg  Vital Signs: Temp: 98.6 F (37 C) (04/19 0315) Temp Source: Oral (04/19 0315) BP: 150/73 (04/19 0330) Pulse Rate: 72 (04/19 0330)  Labs: Recent Labs    07/28/22 2257 07/29/22 0044  HGB 14.0  --   HCT 41.1  --   PLT 198  --   CREATININE 1.24*  --   TROPONINIHS 8,901* 9,078*    Estimated Creatinine Clearance: 32.4 mL/min (A) (by C-G formula based on SCr of 1.24 mg/dL (H)).   Medical History: Past Medical History:  Diagnosis Date   AAA (abdominal aortic aneurysm)    Arthritis    "knees" (03/21/2017)   Borderline diabetes    Bulging lumbar disc    Depression    Disequilibrium    Dizziness    Essential hypertension 12/19/2019   Fall    Fibromyalgia    GERD (gastroesophageal reflux disease)    Glaucoma, both eyes    Hemoptysis 03/18/2015   High cholesterol    "don't have it but I take RX" (03/21/2017)   Hypertension    Mixed dyslipidemia 12/19/2019   Nuclear cataract of both eyes 01/01/2014   Pre-diabetes    pt states she is not diabetic. Last A1C was normal. Pt is on no medications for DM   Preoperative cardiovascular examination 12/19/2019   Primary osteoarthritis of left knee 07/18/2019   Solitary pulmonary nodule 03/18/2015   03/2015 6mm RLL nodule    Status post AAA (abdominal aortic aneurysm) repair 12/19/2019   Status post total left knee replacement 03/27/2020   Syncope 03/21/2017   Syncope and collapse    Assessment: Patient presented to the ED and has been experiencing 10/10 chest pain starting on 4/17. Patient had some resolution of the pain around a 4-5/10 on 4/18. Troponins 8901>>9078 along with ST elevations on EKG. Patient went to cath lab for [facility] where PCI was performed. Patient  currently taking ticagrelor  BID and ASA  daily.   Goal of Therapy:  Heparin level 0.3-0.7 units/ml Monitor platelets by anticoagulation protocol: Yes   Plan:  -Start heparin infusion at 750units/hr 8 hours post sheath removal (scheduled start time ) -Check heparin level 8 hours after initiation of infusion (HL ) -Continue to monitor CBC and for signs/symptoms of bleeding  Cassandra Brooks 07/29/2022,4:29 AM

## 2022-07-29 NOTE — Progress Notes (Signed)
eLink Physician-Brief Progress Note Patient Name: Cassandra Brooks DOB: 1940-08-10 MRN: [id number]   Date of Service  07/29/2022  HPI/Events of Note  82 year old female with no previous cardiac history was seen on 4/19 with new onset chest pain with ST elevation on EKG.  She has a history of metabolic syndrome with diabetes status post abdominal aortic aneurysm repair.  She had no nausea, diaphoresis, or dyspnea.  She was briefly placed on nitroglycerin.   laboratory studies showed elevated high-sensitivity troponin.  EKG shows inferolateral ST elevation  PCI was performed, no stents were left in place.  eICU Interventions  Add oxygen therapy  Ongoing nitro drip for chest pain  Currently on aspirin and Brilinta, atorvastatin  Pending restratification labs and echo  No immediate intervention is indicated.     Intervention Category Evaluation Type: New Patient Evaluation  Treavon Castilleja 07/29/2022, 4:07 AM

## 2022-07-29 NOTE — Progress Notes (Signed)
Rounding Note    Patient Name: Cassandra Brooks Date of Encounter: 07/29/2022  [facility] Cardiologist: Garwin Brothers, MD   Subjective   No acute events overnight.  Remains chest pain free.  Tele without NSVT  Inpatient Medications    Scheduled Meds:  aspirin  81 mg Oral Daily   atorvastatin  80 mg Oral Daily   Chlorhexidine Gluconate Cloth  6 each Topical Daily   nitroGLYCERIN       sodium chloride flush  3 mL Intravenous Q12H   ticagrelor  90 mg Oral BID   Continuous Infusions:  sodium chloride 100 mL/hr at 07/29/22 0400   sodium chloride     heparin     nitroGLYCERIN 15 mcg/min (07/29/22 0400)   PRN Meds: sodium chloride, acetaminophen, diazepam, hydrALAZINE, labetalol, nitroGLYCERIN, ondansetron (ZOFRAN) IV, sodium chloride flush   Vital Signs    Vitals:   07/29/22 0600 07/29/22 0615 07/29/22 0630 07/29/22 0800  BP: 133/62 (!) 123/57 (!) 125/56 132/61  Pulse: 70 77 71 86  Resp: Temp:      TempSrc:      SpO2: 93% 94% 94% 96%  Weight:      Height:        Intake/Output Summary (Last 24 hours) at 07/29/2022 0913 Last data filed at 07/29/2022 0400 Gross per 24 hour  Intake 74.75 ml  Output --  Net 74.75 ml      07/28/2022   10:57 PM 11/22/2021    7:47 AM 07/15/2021    8:43 AM  Last 3 Weights  Weight (lbs) 165 lb 175 lb 175 lb  Weight (kg) 74.844 kg 79.379 kg 79.379 kg      Telemetry    SR - Personally Reviewed  ECG    SR with resolved inferior STE- Personally Reviewed  Physical Exam   GEN: No acute distress.   Neck: No JVD Cardiac: RRR, no murmurs, rubs, or gallops.  Respiratory: Clear to auscultation bilaterally. GI: Soft, nontender, non-distended  MS: No edema; No deformity.  IV infiltration LUE Neuro:  Nonfocal  Psych: Normal affect   Labs    High Sensitivity Troponin:   Recent Labs  Lab 07/28/22 2257 07/29/22 0044 07/29/22 0557  TROPONINIHS 8,901* 9,078* 15,064*     Chemistry Recent Labs  Lab  07/28/22 2257 07/29/22 0557  NA 137 137  K 4.3 3.5  CL 100 103  CO2 25 24  GLUCOSE 134* 135*  BUN 23 22  CREATININE 1.24* 0.94  CALCIUM 9.3 8.6*  GFRNONAA 43* >60  ANIONGAP 12 10    Lipids No results for input(s): "CHOL", "TRIG", "HDL", "LABVLDL", "LDLCALC", "CHOLHDL" in the last 168 hours.  Hematology Recent Labs  Lab 07/28/22 2257 07/29/22 0557  WBC 6.7 6.3  RBC 3.99 3.54*  HGB 14.0 12.3  HCT 41.1 35.9*  MCV 103.0* 101.4*  MCH 35.1* 34.7*  MCHC 34.1 34.3  RDW 12.7 12.9  PLT 198 172   Thyroid No results for input(s): "TSH", "FREET4" in the last 168 hours.  BNPNo results for input(s): "BNP", "PROBNP" in the last 168 hours.  DDimer No results for input(s): "DDIMER" in the last 168 hours.   Radiology    CARDIAC CATHETERIZATION  Result Date: 07/29/2022   2nd Diag lesion is 20% stenosed.   Mid LAD lesion is 40% stenosed.   Prox Cx to Mid Cx lesion is 20% stenosed.   1st Mrg lesion is 30% stenosed.   Prox RCA lesion is 20%  stenosed.   RPDA lesion is 100% stenosed.   Balloon angioplasty was performed.   Post intervention, there is a 20% residual stenosis.   The left ventricular systolic function is normal.   LV end diastolic pressure is normal.   The left ventricular ejection fraction is 50-55% by visual estimate. Inferior STEMI secondary to total occlusion of a small posterior descending artery branch of a dominant RCA. Mild concomitant CAD with 40% mid LAD stenosis calcified segment; left circumflex 40% diffuse OM1 stenosis with 20% proximal circumflex stenosis, and mild irregularity of the dominant RCA with total occlusion of the PDA and large posterolateral vessel. Preserved global LV contractility with EF estimate approximately 55%.  V EDP 15 mmHg. With the patient still having chest pain, PCI was performed to the PDA vessel which turned out to be a small bifurcating vessel.  Due to vessel size, stenting was not done.  The 100% occlusion was reduced to 20% with resumption of  TIMI-3 flow.  RECOMMENDATION: DAPT with aspirin/Brilinta.  Medical therapy for concomitant CAD.  Aggressive lipid-lowering therapy with target LDL less than 55 and optimal blood pressure control.  Patient will be maintained on low-dose IV nitroglycerin post procedure.  Since stenting was not performed, will resume heparin following TR band removal.   DG Chest 2 View  Result Date: 07/28/2022 CLINICAL DATA:  Chest pain and shortness of breath. EXAM: CHEST - 2 VIEW COMPARISON:  PA Lat 04/22/2021 FINDINGS: Heart size and central vasculature are normal. There is calcification of the transverse aorta. The mediastinum is normally outlined. The lungs again show mild increased interstitial markings but no focal pneumonic infiltrate. No pleural effusion is seen. Osteopenia. Comparison to the prior study reveals no significant interval change. IMPRESSION: No acute cardiopulmonary disease. Mild increased interstitial markings unchanged over the previous exam. Aortic atherosclerosis. Electronically Signed   By: Almira Bar M.D.   On: 07/28/2022 23:23    Cardiac Studies   POBA 07/29/22 Inferior STEMI secondary to total occlusion of a small posterior descending artery branch of a dominant RCA.   Mild concomitant CAD with 40% mid LAD stenosis calcified segment; left circumflex 40% diffuse OM1 stenosis with 20% proximal circumflex stenosis, and mild irregularity of the dominant RCA with total occlusion of the PDA and large posterolateral vessel.   Preserved global LV contractility with EF estimate approximately 55%.  V EDP 15 mmHg.   With the patient still having chest pain, PCI was performed to the PDA vessel which turned out to be a small bifurcating vessel.  Due to vessel size, stenting was not done.  The 100% occlusion was reduced to 20% with resumption of TIMI-3 flow.      Assessment & Plan     Inferior STEMI:  S/p PTCA small PDA branch without stenting.  Cont medical therapy for STEMI with ASA,  ticagrelor, high dose atorvastatin.  No BB given REDUCE-AMI trial (LV gram looks to be normal function; f/u TTE today).  Could be discharged tomorrow if ambulatory and no NSVT. HTN:  BP well controlled HL:  High dose atorvastatin   For questions or updates, please contact [facility] Please consult [email or web address] for contact info under        Signed, Orbie Pyo, MD  07/29/2022, 9:13 AM

## 2022-07-29 NOTE — Care Management (Signed)
  Transition of Care [facility]) Screening Note   Patient Details  Name: Cassandra Brooks Date of Birth: July 29, 1940   Transition of Care [facility]) CM/SW Contact:    Gala Lewandowsky, RN Phone Number: 07/29/2022, 11:37 AM    Transition of Care Department [facility]) has reviewed the patient and no TOC needs have been identified at this time. Patient presented for chest pain- post cath. Plan for Brilinta and co pay is $11.20. We will continue to monitor patient advancement through interdisciplinary progression rounds. If new patient transition needs arise, please place a TOC consult.

## 2022-07-29 NOTE — ED Provider Notes (Signed)
[place] EMERGENCY DEPARTMENT AT [facility] Provider Note   CSN: [id number] Arrival date & time: 07/28/22  2241     History  Chief Complaint  Patient presents with   Chest Pain    Cassandra Brooks is a 82 y.o. female.  Patient presents to the emergency department for evaluation of chest pain.  Patient reports that she started having pain yesterday afternoon.  Pain was intense, 10 out of 10 and radiated into her back.  It was present throughout the night.  Patient reports that the pain has eased off some today, now just anterior chest pain, 4 or 5 out of 10.  She has had some nausea, no vomiting.  Baseline shortness of breath, no change.       Home Medications Prior to Admission medications   Medication Sig Start Date End Date Taking? Authorizing Provider  acetaminophen (TYLENOL) 500 MG tablet Take 1,000 mg by mouth every 6 (six) hours as needed for mild pain.    [provider]  amLODipine (NORVASC) 10 MG tablet Take 10 mg by mouth daily.    [provider]  amoxicillin (AMOXIL) 500 MG capsule Take 4 pills one hour prior to dental work 03/18/22   Cristie Hem, PA-C  amoxicillin (AMOXIL) 500 MG tablet take 4 pills one hour prior to dental work 05/17/21   Cristie Hem, PA-C  aspirin EC 81 MG tablet Take 81 mg by mouth daily. Swallow whole.    [provider]  diclofenac (VOLTAREN) 75 MG EC tablet Take 1 tablet (75 mg total) by mouth 2 (two) times daily as needed. 10/19/21   Cristie Hem, PA-C  diclofenac Sodium (VOLTAREN) 1 % GEL Apply 2 g topically 4 (four) times daily. 10/19/21   Cristie Hem, PA-C  docusate sodium (COLACE) 100 MG capsule Take 1 capsule (100 mg total) by mouth 2 (two) times daily. 04/27/21   Kerrin Champagne, MD  dorzolamide-timolol (COSOPT) 22.3-6.8 MG/ML ophthalmic solution Place 1 drop into both eyes 2 (two) times daily.    [provider]  fluticasone (FLONASE) 50 MCG/ACT nasal spray Place 2 sprays into  both nostrils 2 (two) times daily. 11/08/19   [provider]  gabapentin (NEURONTIN) 100 MG capsule Take 1 capsule (100 mg total) by mouth 3 (three) times daily. 04/27/21   Kerrin Champagne, MD  hydrochlorothiazide (HYDRODIURIL) 12.5 MG tablet Take 12.5 mg by mouth daily. 01/05/21   [provider]  HYDROcodone-acetaminophen (NORCO) 7.5-325 MG tablet Take 1 tablet by mouth every 6 (six) hours as needed for moderate pain ((score 4 to 6)). 04/27/21   Kerrin Champagne, MD  LUMIGAN 0.01 % SOLN Place 1 drop into both eyes at bedtime. 12/12/19   [provider]  meclizine (ANTIVERT) 25 MG tablet Take 1 tablet (25 mg total) by mouth 3 (three) times daily as needed for dizziness. 10/08/20   Fayrene Helper, PA-C  methocarbamol (ROBAXIN) 500 MG tablet Take 1 tablet (500 mg total) by mouth every 6 (six) hours as needed for muscle spasms. 04/27/21   Kerrin Champagne, MD  methocarbamol (ROBAXIN) 500 MG tablet Take 1 tablet (500 mg total) by mouth every 8 (eight) hours as needed for muscle spasms. 05/07/21   Naida Sleight, PA-C  nabumetone (RELAFEN) 750 MG tablet Take 750 mg by mouth 2 (two) times daily. 02/21/17   [provider]  omeprazole (PRILOSEC) 20 MG capsule Take 20 mg by mouth daily.    [provider]  pravastatin (PRAVACHOL) 20 MG tablet Take 20 mg by mouth at bedtime.    [provider]  traMADol-acetaminophen (ULTRACET) 37.5-325 MG tablet Take 1 tablet by mouth every 6 (six) hours as needed. 07/15/21   Kerrin Champagne, MD  venlafaxine XR (EFFEXOR-XR) 150 MG 24 hr capsule Take 1 capsule (150 mg total) by mouth daily with breakfast. 03/28/17   Oralia Manis, DO      Allergies    Patient has no known allergies.    Review of Systems   Review of Systems  Physical Exam Updated Vital Signs BP (!) 159/67   Pulse 79   Temp 98.4 F (36.9 C)   Resp (!) 27   Ht  (1.549 m)   Wt 74.8 kg   SpO2 97%   BMI 31.18 kg/m  Physical Exam Vitals and nursing note  reviewed.  Constitutional:      General: She is not in acute distress.    Appearance: She is well-developed.  HENT:     Head: Normocephalic and atraumatic.     Mouth/Throat:     Mouth: Mucous membranes are moist.  Eyes:     General: Vision grossly intact. Gaze aligned appropriately.     Extraocular Movements: Extraocular movements intact.     Conjunctiva/sclera: Conjunctivae normal.  Cardiovascular:     Rate and Rhythm: Normal rate and regular rhythm.     Pulses: Normal pulses.     Heart sounds: Normal heart sounds, S1 normal and S2 normal. No murmur heard.    No friction rub. No gallop.  Pulmonary:     Effort: Pulmonary effort is normal. No respiratory distress.     Breath sounds: Normal breath sounds.  Abdominal:     General: Bowel sounds are normal.     Palpations: Abdomen is soft.     Tenderness: There is no abdominal tenderness. There is no guarding or rebound.     Hernia: No hernia is present.  Musculoskeletal:        General: No swelling.     Cervical back: Full passive range of motion without pain, normal range of motion and neck supple. No spinous process tenderness or muscular tenderness. Normal range of motion.     Right lower leg: No edema.     Left lower leg: No edema.  Skin:    General: Skin is warm and dry.     Capillary Refill: Capillary refill takes less than 2 seconds.     Findings: No ecchymosis, erythema, rash or wound.  Neurological:     General: No focal deficit present.     Mental Status: She is alert and oriented to person, place, and time.     GCS: GCS eye subscore is 4. GCS verbal subscore is 5. GCS motor subscore is 6.     Cranial Nerves: Cranial nerves 2-12 are intact.     Sensory: Sensation is intact.     Motor: Motor function is intact.     Coordination: Coordination is intact.  Psychiatric:        Attention and Perception: Attention normal.        Mood and Affect: Mood normal.        Speech: Speech normal.        Behavior: Behavior normal.      ED Results / Procedures / Treatments   Labs (all labs ordered are listed, but only abnormal results are displayed) Labs Reviewed  BASIC METABOLIC PANEL - Abnormal; Notable for the following components:  Result Value   Glucose, Bld 134 (*)    Creatinine, Ser 1.24 (*)    GFR, Estimated 43 (*)    All other components within normal limits  CBC - Abnormal; Notable for the following components:   MCV 103.0 (*)    MCH 35.1 (*)    All other components within normal limits  TROPONIN I (HIGH SENSITIVITY) - Abnormal; Notable for the following components:   Troponin I (High Sensitivity) 8,901 (*)    All other components within normal limits  TROPONIN I (HIGH SENSITIVITY)    EKG EKG Interpretation  Date/Time:  Thursday July 28 2022 22:49:51 EDT Ventricular Rate:  95 PR Interval:  154 QRS Duration: 62 QT Interval:  390 QTC Calculation: 490 R Axis:   48 Text Interpretation: Normal sinus rhythm ST & T wave abnormality, consider inferior ischemia Prolonged QT Abnormal ECG When compared with ECG of 22-Apr-2021 09:06, inferior-lateral ST elevations are now present Confirmed by Gilda Crease [phone]) on 07/29/2022 12:24:39 AM  Radiology DG Chest 2 View  Result Date: 07/28/2022 CLINICAL DATA:  Chest pain and shortness of breath. EXAM: CHEST - 2 VIEW COMPARISON:  PA Lat 04/22/2021 FINDINGS: Heart size and central vasculature are normal. There is calcification of the transverse aorta. The mediastinum is normally outlined. The lungs again show mild increased interstitial markings but no focal pneumonic infiltrate. No pleural effusion is seen. Osteopenia. Comparison to the prior study reveals no significant interval change. IMPRESSION: No acute cardiopulmonary disease. Mild increased interstitial markings unchanged over the previous exam. Aortic atherosclerosis. Electronically Signed   By: Almira Bar M.D.   On: 07/28/2022 23:23    Procedures Procedures    Medications Ordered  in ED Medications  morphine (PF) 4 MG/ML injection 4 mg (has no administration in time range)  ondansetron (ZOFRAN) injection 4 mg (has no administration in time range)  aspirin chewable tablet 324 mg (324 mg Oral Given 07/29/22 0039)  heparin injection 4,000 Units (4,000 Units Intravenous Given 07/29/22 0039)    ED Course/ Medical Decision Making/ A&P                             Medical Decision Making Amount and/or Complexity of Data Reviewed Labs: ordered. Radiology: ordered.   Differential Diagnosis considered includes, but not limited to: STEMI; NSTEMI; myocarditis; pericarditis; pulmonary embolism; aortic dissection; pneumothorax; pneumonia; gastritis; musculoskeletal pain   Patient presents to the emergency department with persistent chest pain.  Patient reports that her pain began yesterday afternoon and was more severe yesterday than today.  Patient was seen in triage and workup initiated.  When her troponin returned elevated, she was placed in a room.  It was at that time that I did look at her EKG and noted inferior and lateral ST elevations concerning for STEMI.  Code STEMI was activated.  Patient administered aspirin, heparin bolus.  Discussed with Dr. Tresa Endo, on-call for interventional radiology.  He has reviewed images and agrees that the patient should go to the Cath Lab tonight. . CRITICAL CARE Performed by: Gilda Crease   Total critical care time: 35 minutes  Critical care time was exclusive of separately billable procedures and treating other patients.  Critical care was necessary to treat or prevent imminent or life-threatening deterioration.  Critical care was time spent personally by me on the following activities: development of treatment plan with patient and/or surrogate as well as nursing, discussions with consultants, evaluation of patient's response  to treatment, examination of patient, obtaining history from patient or surrogate, ordering and  performing treatments and interventions, ordering and review of laboratory studies, ordering and review of radiographic studies, pulse oximetry and re-evaluation of patient's condition.         Final Clinical Impression(s) / ED Diagnoses Final diagnoses:  ST elevation myocardial infarction (STEMI), unspecified artery    Rx / DC Orders ED Discharge Orders     None         Gilda Crease, MD 07/29/22 [phone]

## 2022-07-30 DIAGNOSIS — I2111 ST elevation (STEMI) myocardial infarction involving right coronary artery: Secondary | ICD-10-CM | POA: Diagnosis not present

## 2022-07-30 LAB — CBC
HCT: 35.6 % — ABNORMAL LOW (ref 36.0–46.0)
Hemoglobin: 12 g/dL (ref 12.0–15.0)
MCH: 35 pg — ABNORMAL HIGH (ref 26.0–34.0)
MCHC: 33.7 g/dL (ref 30.0–36.0)
MCV: 103.8 fL — ABNORMAL HIGH (ref 80.0–100.0)
Platelets: 167 10*3/uL (ref 150–400)
RBC: 3.43 MIL/uL — ABNORMAL LOW (ref 3.87–5.11)
RDW: 12.9 % (ref 11.5–15.5)
WBC: 5.1 10*3/uL (ref 4.0–10.5)
nRBC: 0 % (ref 0.0–0.2)

## 2022-07-30 LAB — BASIC METABOLIC PANEL
Anion gap: 6 (ref 5–15)
BUN: 19 mg/dL (ref 8–23)
CO2: 25 mmol/L (ref 22–32)
Calcium: 8.5 mg/dL — ABNORMAL LOW (ref 8.9–10.3)
Chloride: 103 mmol/L (ref 98–111)
Creatinine, Ser: 0.82 mg/dL (ref 0.44–1.00)
GFR, Estimated: >60 mL/min (ref >60)
Glucose, Bld: 125 mg/dL — ABNORMAL HIGH (ref 70–99)
Potassium: 3.8 mmol/L (ref 3.5–5.1)
Sodium: 134 mmol/L — ABNORMAL LOW (ref 135–145)

## 2022-07-30 MED ORDER — HYDRALAZINE HCL 20 MG/ML IJ SOLN
10.0000 mg | Freq: Once | INTRAMUSCULAR | Status: AC
  Administered 2022-07-30: 10 mg via INTRAVENOUS
  Filled 2022-07-30: qty 1

## 2022-07-30 MED ORDER — LISINOPRIL 20 MG PO TABS
20.0000 mg | ORAL_TABLET | Freq: Every day | ORAL | Status: DC
  Administered 2022-07-30 – 2022-08-01 (×3): 20 mg via ORAL
  Filled 2022-07-30 (×3): qty 1

## 2022-07-30 MED ORDER — AMLODIPINE BESYLATE 5 MG PO TABS
5.0000 mg | ORAL_TABLET | Freq: Once | ORAL | Status: AC
  Administered 2022-07-30: 5 mg via ORAL
  Filled 2022-07-30: qty 1

## 2022-07-30 MED ORDER — POTASSIUM CHLORIDE CRYS ER 20 MEQ PO TBCR
20.0000 meq | EXTENDED_RELEASE_TABLET | Freq: Once | ORAL | Status: AC
  Administered 2022-07-30: 20 meq via ORAL
  Filled 2022-07-30: qty 1

## 2022-07-30 NOTE — Progress Notes (Signed)
Rounding Note    Patient Name: Cassandra Brooks Date of Encounter: 07/30/2022  [facility] Cardiologist: Garwin Brothers, MD   Subjective   No acute events overnight.  Remains chest pain free.  Tele without NSVT  Inpatient Medications    Scheduled Meds:  amLODipine  5 mg Oral Daily   aspirin  81 mg Oral Daily   atorvastatin  80 mg Oral Daily   Chlorhexidine Gluconate Cloth  6 each Topical Daily   dorzolamide-timolol  1 drop Both Eyes BID   latanoprost  1 drop Both Eyes QHS   lisinopril  20 mg Oral Daily   sodium chloride flush  3 mL Intravenous Q12H   ticagrelor  90 mg Oral BID   Continuous Infusions:  sodium chloride 100 mL/hr at 07/29/22 0400   sodium chloride     PRN Meds: sodium chloride, acetaminophen, diazepam, ondansetron (ZOFRAN) IV, sodium chloride flush   Vital Signs    Vitals:   07/30/22 0600 07/30/22 0700 07/30/22 0800 07/30/22 0820  BP:  (!) 149/65 (!) 145/63   Pulse: 86 92 89   Resp: 13 (!) 27 19   Temp:    97.6 F (36.4 C)  TempSrc:    Oral  SpO2: 96% 95% 94%   Weight:      Height:        Intake/Output Summary (Last 24 hours) at 07/30/2022 0903 Last data filed at 07/30/2022 0800 Gross per 24 hour  Intake 360 ml  Output 450 ml  Net -90 ml      07/28/2022   10:57 PM 11/22/2021    7:47 AM 07/15/2021    8:43 AM  Last 3 Weights  Weight (lbs) 165 lb 175 lb 175 lb  Weight (kg) 74.844 kg 79.379 kg 79.379 kg      Telemetry    SR - Personally Reviewed  ECG    SR with resolved inferior STE- Personally Reviewed  Physical Exam   GEN: No acute distress.   Neck: No JVD Cardiac: RRR, no murmurs, rubs, or gallops. Right radial pulse normal Respiratory: Clear to auscultation bilaterally. GI: Soft, nontender, non-distended  MS: No edema; No deformity. IV infiltration LUE looks stable. Neuro:  Nonfocal  Psych: Normal affect   Labs    High Sensitivity Troponin:   Recent Labs  Lab 07/28/22 2257 07/29/22 0044 07/29/22 0557   TROPONINIHS 8,901* 9,078* 15,064*     Chemistry Recent Labs  Lab 07/28/22 2257 07/29/22 0557 07/30/22 0144  NA 137 137 134*  K 4.3 3.5 3.8  CL 100 103 103  CO2 GLUCOSE 134* 135* 125*  BUN CREATININE 1.24* 0.94 0.82  CALCIUM 9.3 8.6* 8.5*  GFRNONAA 43* >60 >60  ANIONGAP Lipids No results for input(s): "CHOL", "TRIG", "HDL", "LABVLDL", "LDLCALC", "CHOLHDL" in the last 168 hours.  Hematology Recent Labs  Lab 07/28/22 2257 07/29/22 0557 07/30/22 0144  WBC 6.7 6.3 5.1  RBC 3.99 3.54* 3.43*  HGB 14.0 12.3 12.0  HCT 41.1 35.9* 35.6*  MCV 103.0* 101.4* 103.8*  MCH 35.1* 34.7* 35.0*  MCHC 34.1 34.3 33.7  RDW 12.7 12.9 12.9  PLT 198 172 167   Thyroid No results for input(s): "TSH", "FREET4" in the last 168 hours.  BNPNo results for input(s): "BNP", "PROBNP" in the last 168 hours.  DDimer No results for input(s): "DDIMER" in the last 168 hours.   Radiology    CARDIAC CATHETERIZATION  Result  Date: 07/29/2022   2nd Diag lesion is 20% stenosed.   Mid LAD lesion is 40% stenosed.   Prox Cx to Mid Cx lesion is 20% stenosed.   1st Mrg lesion is 30% stenosed.   Prox RCA lesion is 20% stenosed.   RPDA lesion is 100% stenosed.   Balloon angioplasty was performed.   Post intervention, there is a 20% residual stenosis.   The left ventricular systolic function is normal.   LV end diastolic pressure is normal.   The left ventricular ejection fraction is 50-55% by visual estimate. Inferior STEMI secondary to total occlusion of a small posterior descending artery branch of a dominant RCA. Mild concomitant CAD with 40% mid LAD stenosis calcified segment; left circumflex 40% diffuse OM1 stenosis with 20% proximal circumflex stenosis, and mild irregularity of the dominant RCA with total occlusion of the PDA and large posterolateral vessel. Preserved global LV contractility with EF estimate approximately 55%.  V EDP 15 mmHg. With the patient still having chest pain, PCI  was performed to the PDA vessel which turned out to be a small bifurcating vessel.  Due to vessel size, stenting was not done.  The 100% occlusion was reduced to 20% with resumption of TIMI-3 flow.  RECOMMENDATION: DAPT with aspirin/Brilinta.  Medical therapy for concomitant CAD.  Aggressive lipid-lowering therapy with target LDL less than 55 and optimal blood pressure control.  Patient will be maintained on low-dose IV nitroglycerin post procedure.  Since stenting was not performed, will resume heparin following TR band removal.   DG Chest 2 View  Result Date: 07/28/2022 CLINICAL DATA:  Chest pain and shortness of breath. EXAM: CHEST - 2 VIEW COMPARISON:  PA Lat 04/22/2021 FINDINGS: Heart size and central vasculature are normal. There is calcification of the transverse aorta. The mediastinum is normally outlined. The lungs again show mild increased interstitial markings but no focal pneumonic infiltrate. No pleural effusion is seen. Osteopenia. Comparison to the prior study reveals no significant interval change. IMPRESSION: No acute cardiopulmonary disease. Mild increased interstitial markings unchanged over the previous exam. Aortic atherosclerosis. Electronically Signed   By: Almira Bar M.D.   On: 07/28/2022 23:23    Cardiac Studies   POBA 07/29/22 Inferior STEMI secondary to total occlusion of a small posterior descending artery branch of a dominant RCA.   Mild concomitant CAD with 40% mid LAD stenosis calcified segment; left circumflex 40% diffuse OM1 stenosis with 20% proximal circumflex stenosis, and mild irregularity of the dominant RCA with total occlusion of the PDA and large posterolateral vessel.   Preserved global LV contractility with EF estimate approximately 55%.  V EDP 15 mmHg.   With the patient still having chest pain, PCI was performed to the PDA vessel which turned out to be a small bifurcating vessel.  Due to vessel size, stenting was not done.  The 100% occlusion was reduced  to 20% with resumption of TIMI-3 flow.      Assessment & Plan     Inferior STEMI:  S/p PTCA small PDA branch without stenting.  Cont medical therapy for STEMI with ASA, ticagrelor, high dose atorvastatin.  TTE yet to be performed, will arrange today. Could be discharged tomorrow if ambulatory and no NSVT.  HTN: on amlodipine 5 mg daily, lisinopril 20 mg daily. BP elevated today, improved with meds, consider increasing amlodipine if BP remains elevated. HL:  High dose atorvastatin   For questions or updates, please contact [facility] Please consult [email or web address] for contact info under  Signed, Parke Poisson, MD  07/30/2022, 9:03 AM

## 2022-07-30 NOTE — Progress Notes (Signed)
CARDIAC REHAB PHASE I   PRE:  Rate/Rhythm: NSR 86  BP:  Sitting: 156/73       SaO2: 95%, RA  MODE:  Ambulation: 170 ft   POST:  Rate/Rhythm: NSR  BP:  Sitting: 180/71      SaO2: 96%, RA  Pt tolerated exercise well and amb 170 ft with  walker  independently. Pt denies CP, SOB, or dizziness throughout walk. Pt was provided MI book and education was reviewed with pt. Topics included MI risk factors, exercise guidelines, chest pain s/s, post cath restrictions, nutrition, and introduced pt to Phase 2 of cardiac rehab. Pt is excited about attending and will send referral into [facility] in [place]. We will continue to follow pt until discharged.   [id number] Essie Hart, [place], ACSM-CEP 07/30/2022 12:50 PM

## 2022-07-31 ENCOUNTER — Inpatient Hospital Stay (HOSPITAL_COMMUNITY): Payer: Medicare Other

## 2022-07-31 DIAGNOSIS — I2111 ST elevation (STEMI) myocardial infarction involving right coronary artery: Secondary | ICD-10-CM | POA: Diagnosis not present

## 2022-07-31 DIAGNOSIS — R079 Chest pain, unspecified: Secondary | ICD-10-CM

## 2022-07-31 DIAGNOSIS — I213 ST elevation (STEMI) myocardial infarction of unspecified site: Secondary | ICD-10-CM

## 2022-07-31 LAB — ECHOCARDIOGRAM COMPLETE
AR max vel: 2.22 cm2
AV Area VTI: 2.13 cm2
AV Area mean vel: 2.19 cm2
AV Mean grad: 4 mmHg
AV Peak grad: 6.6 mmHg
Ao pk vel: 1.28 m/s
Area-P 1/2: 2.93 cm2
Height: 61 in
S' Lateral: 2.5 cm
Weight: 2664 oz

## 2022-07-31 LAB — BASIC METABOLIC PANEL
Anion gap: 8 (ref 5–15)
BUN: 24 mg/dL — ABNORMAL HIGH (ref 8–23)
CO2: 22 mmol/L (ref 22–32)
Calcium: 8.8 mg/dL — ABNORMAL LOW (ref 8.9–10.3)
Chloride: 107 mmol/L (ref 98–111)
Creatinine, Ser: 0.97 mg/dL (ref 0.44–1.00)
GFR, Estimated: 58 mL/min — ABNORMAL LOW (ref >60)
Glucose, Bld: 141 mg/dL — ABNORMAL HIGH (ref 70–99)
Potassium: 3.4 mmol/L — ABNORMAL LOW (ref 3.5–5.1)
Sodium: 137 mmol/L (ref 135–145)

## 2022-07-31 LAB — CBC
HCT: 37.4 % (ref 36.0–46.0)
Hemoglobin: 12.4 g/dL (ref 12.0–15.0)
MCH: 34.5 pg — ABNORMAL HIGH (ref 26.0–34.0)
MCHC: 33.2 g/dL (ref 30.0–36.0)
MCV: 104.2 fL — ABNORMAL HIGH (ref 80.0–100.0)
Platelets: 191 10*3/uL (ref 150–400)
RBC: 3.59 MIL/uL — ABNORMAL LOW (ref 3.87–5.11)
RDW: 12.8 % (ref 11.5–15.5)
WBC: 5.8 10*3/uL (ref 4.0–10.5)
nRBC: 0 % (ref 0.0–0.2)

## 2022-07-31 MED ORDER — PERFLUTREN LIPID MICROSPHERE
1.0000 mL | INTRAVENOUS | Status: AC | PRN
  Administered 2022-07-31: 2 mL via INTRAVENOUS

## 2022-07-31 MED ORDER — SPIRONOLACTONE 12.5 MG HALF TABLET
12.5000 mg | ORAL_TABLET | Freq: Once | ORAL | Status: AC
  Administered 2022-07-31: 12.5 mg via ORAL
  Filled 2022-07-31: qty 1

## 2022-07-31 MED ORDER — HYDRALAZINE HCL 20 MG/ML IJ SOLN
10.0000 mg | Freq: Three times a day (TID) | INTRAMUSCULAR | Status: DC | PRN
  Administered 2022-07-31: 10 mg via INTRAVENOUS
  Filled 2022-07-31: qty 1

## 2022-07-31 MED ORDER — SPIRONOLACTONE 25 MG PO TABS
25.0000 mg | ORAL_TABLET | Freq: Every day | ORAL | Status: DC
  Administered 2022-08-01: 25 mg via ORAL
  Filled 2022-07-31: qty 1

## 2022-07-31 MED ORDER — POTASSIUM CHLORIDE CRYS ER 20 MEQ PO TBCR
20.0000 meq | EXTENDED_RELEASE_TABLET | Freq: Once | ORAL | Status: AC
  Administered 2022-07-31: 20 meq via ORAL
  Filled 2022-07-31: qty 1

## 2022-07-31 MED ORDER — SPIRONOLACTONE 12.5 MG HALF TABLET
12.5000 mg | ORAL_TABLET | Freq: Every day | ORAL | Status: DC
  Administered 2022-07-31: 12.5 mg via ORAL
  Filled 2022-07-31: qty 1

## 2022-07-31 MED ORDER — AMLODIPINE BESYLATE 10 MG PO TABS
10.0000 mg | ORAL_TABLET | Freq: Every day | ORAL | Status: DC
  Administered 2022-07-31 – 2022-08-01 (×2): 10 mg via ORAL
  Filled 2022-07-31 (×2): qty 1

## 2022-07-31 MED ORDER — HYDROCHLOROTHIAZIDE 12.5 MG PO TABS: 12.5000 mg | ORAL_TABLET | Freq: Every day | ORAL | Status: DC

## 2022-07-31 NOTE — Progress Notes (Addendum)
Rounding Note    Patient Name: Cassandra Brooks Date of Encounter: 07/31/2022  [facility] Cardiologist: Garwin Brothers, MD   Subjective   Last pm, had an episode of feeling "firecrackers" going off in her head, auditory, not visual. At that time, BP was higher than usual, 185/87. She got IV hydralazine 10 mg and Tylenol, sx improved but did not resolve. Had some blurred vision w/ this.  Still c/o mild firecrackers but vision ok.  Remains chest pain free.  Tele without NSVT  Inpatient Medications    Scheduled Meds:  amLODipine  5 mg Oral Daily   aspirin  81 mg Oral Daily   atorvastatin  80 mg Oral Daily   Chlorhexidine Gluconate Cloth  6 each Topical Daily   dorzolamide-timolol  1 drop Both Eyes BID   latanoprost  1 drop Both Eyes QHS   lisinopril  20 mg Oral Daily   sodium chloride flush  3 mL Intravenous Q12H   ticagrelor  90 mg Oral BID   Continuous Infusions:  sodium chloride 100 mL/hr at 07/29/22 0400   sodium chloride     PRN Meds: sodium chloride, acetaminophen, diazepam, ondansetron (ZOFRAN) IV, sodium chloride flush   Vital Signs    Vitals:   07/30/22 2200 07/30/22 2345 07/31/22 0516 07/31/22 0754  BP: (!) 144/86 (!) 157/74 (!) 160/86 (!) 155/65  Pulse: 96 94 89 85  Resp: Temp:  99.8 F (37.7 C) 99.1 F (37.3 C) 98 F (36.7 C)  TempSrc:  Oral Oral Oral  SpO2: 95% 96% 94% 98%  Weight:  75.5 kg    Height:        Intake/Output Summary (Last 24 hours) at 07/31/2022 0837 Last data filed at 07/30/2022 2000 Gross per 24 hour  Intake 360 ml  Output --  Net 360 ml      07/30/2022   11:45 PM 07/28/2022   10:57 PM 11/22/2021    7:47 AM  Last 3 Weights  Weight (lbs) 166 lb 8 oz 165 lb 175 lb  Weight (kg) 75.524 kg 74.844 kg 79.379 kg      Telemetry    SR, ST - Personally Reviewed  ECG    SR with resolved inferior STE- Personally Reviewed  Physical Exam   GEN: No acute distress.   Neck: No JVD Cardiac: RRR, no  murmurs, rubs, or gallops. Right radial pulse normal, no hematoma or ecchymosis at cath site Respiratory: Clear to auscultation bilaterally. GI: Soft, nontender, non-distended  MS: No edema; No deformity. IV infiltration LUE looks stable. Neuro:  Nonfocal, CN II-XII grossly intact, equal strength Psych: Normal affect   Labs    High Sensitivity Troponin:   Recent Labs  Lab 07/28/22 2257 07/29/22 0044 07/29/22 0557  TROPONINIHS 8,901* 9,078* 15,064*     Chemistry Recent Labs  Lab 07/29/22 0557 07/30/22 0144 07/31/22 0202  NA 137 134* 137  K 3.5 3.8 3.4*  CL 103 103 107  CO2 GLUCOSE 135* 125* 141*  BUN 22 19 24*  CREATININE 0.94 0.82 0.97  CALCIUM 8.6* 8.5* 8.8*  GFRNONAA >60 >60 58*  ANIONGAP Lipids No results for input(s): "CHOL", "TRIG", "HDL", "LABVLDL", "LDLCALC", "CHOLHDL" in the last 168 hours.  Hematology Recent Labs  Lab 07/29/22 0557 07/30/22 0144 07/31/22 0202  WBC 6.3 5.1 5.8  RBC 3.54* 3.43* 3.59*  HGB 12.3 12.0 12.4  HCT 35.9* 35.6* 37.4  MCV 101.4*  103.8* 104.2*  MCH 34.7* 35.0* 34.5*  MCHC 34.3 33.7 33.2  RDW 12.9 12.9 12.8  PLT 172 167 191   Thyroid No results for input(s): "TSH", "FREET4" in the last 168 hours.  BNPNo results for input(s): "BNP", "PROBNP" in the last 168 hours.  DDimer No results for input(s): "DDIMER" in the last 168 hours.   Radiology    No results found.  Cardiac Studies   POBA 07/29/22 Inferior STEMI secondary to total occlusion of a small posterior descending artery branch of a dominant RCA.   Mild concomitant CAD with 40% mid LAD stenosis calcified segment; left circumflex 40% diffuse OM1 stenosis with 20% proximal circumflex stenosis, and mild irregularity of the dominant RCA with total occlusion of the PDA and large posterolateral vessel.   Preserved global LV contractility with EF estimate approximately 55%.  V EDP 15 mmHg.   With the patient still having chest pain, PCI was performed to  the PDA vessel which turned out to be a small bifurcating vessel.  Due to vessel size, stenting was not done.  The 100% occlusion was reduced to 20% with resumption of TIMI-3 flow.      Assessment & Plan     Inferior STEMI:  S/p PTCA small PDA branch without stenting.  Cont medical therapy for STEMI with ASA, ticagrelor, high dose atorvastatin. No BB given REDUCE-AMI trial (LV gram looks to be normal function; f/u TTE today). Contacted echo, they will do next. Could be discharged today if ambulatory, and neuro status stable. No NSVT > 24 hr. HTN: on amlodipine 5 mg daily, lisinopril 20 mg daily. BP elevated generally 140s-160s so will increase amlodipine to 10 mg qd HL:  continue high dose atorvastatin Neurologic episode: unclear cause, although BP was higher than normal. Will ck stat head CT. Discuss w/ MD if Neuro eval indicated   For questions or updates, please contact [facility] Please consult [email or web address] for contact info under        Signed, Theodore Demark, PA-C  07/31/2022, 8:37 AM    Patient seen and examined with Theodore Demark PA-C.  Agree as above, with the following exceptions and changes as noted below. Continues to have symptoms of firecrackers in her head, BP elevated to 169/72. Gen: NAD, CV: RRR, no murmurs, Lungs: clear, Abd: soft, Extrem: Warm, well perfused, no edema, Neuro/Psych: alert and oriented x 3, normal mood and affect. All available labs, radiology testing, previous records reviewed. Neuro is intact but will stat scan patient head CT, and then proceed to echo.   Parke Poisson, MD 07/31/22 9:18 AM

## 2022-07-31 NOTE — Progress Notes (Deleted)
Rounding Note    Patient Name: Cassandra Brooks Date of Encounter: 07/31/2022  [facility] Cardiologist: Garwin Brothers, MD   Subjective   No acute events overnight.  Remains chest pain free.  Tele without NSVT  Inpatient Medications    Scheduled Meds:  amLODipine  5 mg Oral Daily   aspirin  81 mg Oral Daily   atorvastatin  80 mg Oral Daily   Chlorhexidine Gluconate Cloth  6 each Topical Daily   dorzolamide-timolol  1 drop Both Eyes BID   latanoprost  1 drop Both Eyes QHS   lisinopril  20 mg Oral Daily   sodium chloride flush  3 mL Intravenous Q12H   ticagrelor  90 mg Oral BID   Continuous Infusions:  sodium chloride 100 mL/hr at 07/29/22 0400   sodium chloride     PRN Meds: sodium chloride, acetaminophen, diazepam, ondansetron (ZOFRAN) IV, sodium chloride flush   Vital Signs    Vitals:   07/30/22 2200 07/30/22 2345 07/31/22 0516 07/31/22 0754  BP: (!) 144/86 (!) 157/74 (!) 160/86 (!) 155/65  Pulse: 96 94 89 85  Resp: Temp:  99.8 F (37.7 C) 99.1 F (37.3 C) 98 F (36.7 C)  TempSrc:  Oral Oral Oral  SpO2: 95% 96% 94% 98%  Weight:  75.5 kg    Height:        Intake/Output Summary (Last 24 hours) at 07/31/2022 0842 Last data filed at 07/30/2022 2000 Gross per 24 hour  Intake 360 ml  Output --  Net 360 ml      07/30/2022   11:45 PM 07/28/2022   10:57 PM 11/22/2021    7:47 AM  Last 3 Weights  Weight (lbs) 166 lb 8 oz 165 lb 175 lb  Weight (kg) 75.524 kg 74.844 kg 79.379 kg      Telemetry    SR - Personally Reviewed  ECG    No new- Personally Reviewed  Physical Exam   GEN: No acute distress.   Neck: No JVD Cardiac: RRR, no murmurs, rubs, or gallops. Right radial pulse normal Respiratory: Clear to auscultation bilaterally. GI: Soft, nontender, non-distended  MS: No edema; No deformity. IV infiltration LUE looks stable. Neuro:  Nonfocal  Psych: Normal affect   Labs    High Sensitivity Troponin:   Recent Labs   Lab 07/28/22 2257 07/29/22 0044 07/29/22 0557  TROPONINIHS 8,901* 9,078* 15,064*     Chemistry Recent Labs  Lab 07/29/22 0557 07/30/22 0144 07/31/22 0202  NA 137 134* 137  K 3.5 3.8 3.4*  CL 103 103 107  CO2 GLUCOSE 135* 125* 141*  BUN 22 19 24*  CREATININE 0.94 0.82 0.97  CALCIUM 8.6* 8.5* 8.8*  GFRNONAA >60 >60 58*  ANIONGAP Lipids No results for input(s): "CHOL", "TRIG", "HDL", "LABVLDL", "LDLCALC", "CHOLHDL" in the last 168 hours.  Hematology Recent Labs  Lab 07/29/22 0557 07/30/22 0144 07/31/22 0202  WBC 6.3 5.1 5.8  RBC 3.54* 3.43* 3.59*  HGB 12.3 12.0 12.4  HCT 35.9* 35.6* 37.4  MCV 101.4* 103.8* 104.2*  MCH 34.7* 35.0* 34.5*  MCHC 34.3 33.7 33.2  RDW 12.9 12.9 12.8  PLT 172 167 191   Thyroid No results for input(s): "TSH", "FREET4" in the last 168 hours.  BNPNo results for input(s): "BNP", "PROBNP" in the last 168 hours.  DDimer No results for input(s): "DDIMER" in the last 168 hours.   Radiology  No results found.  Cardiac Studies   POBA 07/29/22 Inferior STEMI secondary to total occlusion of a small posterior descending artery branch of a dominant RCA.   Mild concomitant CAD with 40% mid LAD stenosis calcified segment; left circumflex 40% diffuse OM1 stenosis with 20% proximal circumflex stenosis, and mild irregularity of the dominant RCA with total occlusion of the PDA and large posterolateral vessel.   Preserved global LV contractility with EF estimate approximately 55%.  V EDP 15 mmHg.   With the patient still having chest pain, PCI was performed to the PDA vessel which turned out to be a small bifurcating vessel.  Due to vessel size, stenting was not done.  The 100% occlusion was reduced to 20% with resumption of TIMI-3 flow.      Assessment & Plan     Inferior STEMI:  S/p PTCA small PDA branch without stenting.  Cont medical therapy for STEMI with ASA, ticagrelor, high dose atorvastatin.  TTE yet to be performed,  will arrange today. Could be discharged today if ambulatory and no NSVT.  HTN: on amlodipine 10 mg daily, lisinopril 20 mg daily. BP elevated today, increasing amlodipine to 10 mg daily, this is her home dose. Add spironolactone as well given overall low normal K on historical review. Will stop home med of hctz in favor of spironolactone 12.5 mg daily, uptitrate as needed as outpatient. Echo pending for further med titration.  HL:  High dose atorvastatin   For questions or updates, please contact [facility] Please consult [email or web address] for contact info under        Signed, Parke Poisson, MD  07/31/2022, 8:42 AM

## 2022-08-01 ENCOUNTER — Inpatient Hospital Stay (HOSPITAL_COMMUNITY)
Admission: EM | Admit: 2022-08-01 | Discharge: 2022-08-03 | Disposition: A | Discharge status: Home / Self Care | Payer: Medicare Other | Source: Home / Self Care | Attending: Cardiology | Admitting: Cardiology

## 2022-08-01 ENCOUNTER — Emergency Department (HOSPITAL_COMMUNITY): Payer: Medicare Other

## 2022-08-01 ENCOUNTER — Other Ambulatory Visit (HOSPITAL_COMMUNITY): Payer: Self-pay

## 2022-08-01 ENCOUNTER — Encounter (HOSPITAL_COMMUNITY): Payer: Self-pay

## 2022-08-01 DIAGNOSIS — R911 Solitary pulmonary nodule: Secondary | ICD-10-CM | POA: Diagnosis present

## 2022-08-01 DIAGNOSIS — E785 Hyperlipidemia, unspecified: Secondary | ICD-10-CM | POA: Diagnosis present

## 2022-08-01 DIAGNOSIS — Z7902 Long term (current) use of antithrombotics/antiplatelets: Secondary | ICD-10-CM

## 2022-08-01 DIAGNOSIS — K30 Functional dyspepsia: Secondary | ICD-10-CM | POA: Diagnosis present

## 2022-08-01 DIAGNOSIS — I252 Old myocardial infarction: Secondary | ICD-10-CM

## 2022-08-01 DIAGNOSIS — E876 Hypokalemia: Secondary | ICD-10-CM | POA: Diagnosis present

## 2022-08-01 DIAGNOSIS — Z96652 Presence of left artificial knee joint: Secondary | ICD-10-CM | POA: Diagnosis present

## 2022-08-01 DIAGNOSIS — Z801 Family history of malignant neoplasm of trachea, bronchus and lung: Secondary | ICD-10-CM

## 2022-08-01 DIAGNOSIS — Z800 Family history of malignant neoplasm of digestive organs: Secondary | ICD-10-CM

## 2022-08-01 DIAGNOSIS — M797 Fibromyalgia: Secondary | ICD-10-CM | POA: Diagnosis present

## 2022-08-01 DIAGNOSIS — Z833 Family history of diabetes mellitus: Secondary | ICD-10-CM

## 2022-08-01 DIAGNOSIS — Z79899 Other long term (current) drug therapy: Secondary | ICD-10-CM

## 2022-08-01 DIAGNOSIS — Z7982 Long term (current) use of aspirin: Secondary | ICD-10-CM

## 2022-08-01 DIAGNOSIS — I10 Essential (primary) hypertension: Secondary | ICD-10-CM | POA: Diagnosis present

## 2022-08-01 DIAGNOSIS — E782 Mixed hyperlipidemia: Secondary | ICD-10-CM | POA: Diagnosis present

## 2022-08-01 DIAGNOSIS — Z83511 Family history of glaucoma: Secondary | ICD-10-CM

## 2022-08-01 DIAGNOSIS — E119 Type 2 diabetes mellitus without complications: Secondary | ICD-10-CM | POA: Diagnosis present

## 2022-08-01 DIAGNOSIS — Z8249 Family history of ischemic heart disease and other diseases of the circulatory system: Secondary | ICD-10-CM

## 2022-08-01 DIAGNOSIS — H409 Unspecified glaucoma: Secondary | ICD-10-CM | POA: Diagnosis present

## 2022-08-01 DIAGNOSIS — I249 Acute ischemic heart disease, unspecified: Secondary | ICD-10-CM

## 2022-08-01 DIAGNOSIS — I2119 ST elevation (STEMI) myocardial infarction involving other coronary artery of inferior wall: Principal | ICD-10-CM | POA: Diagnosis present

## 2022-08-01 DIAGNOSIS — I2111 ST elevation (STEMI) myocardial infarction involving right coronary artery: Secondary | ICD-10-CM | POA: Diagnosis not present

## 2022-08-01 DIAGNOSIS — K219 Gastro-esophageal reflux disease without esophagitis: Secondary | ICD-10-CM | POA: Diagnosis present

## 2022-08-01 DIAGNOSIS — I213 ST elevation (STEMI) myocardial infarction of unspecified site: Secondary | ICD-10-CM

## 2022-08-01 DIAGNOSIS — I2510 Atherosclerotic heart disease of native coronary artery without angina pectoris: Secondary | ICD-10-CM | POA: Diagnosis present

## 2022-08-01 HISTORY — DX: ST elevation (STEMI) myocardial infarction of unspecified site: I21.3

## 2022-08-01 HISTORY — DX: Acute ischemic heart disease, unspecified: I24.9

## 2022-08-01 LAB — CBC WITH DIFFERENTIAL/PLATELET
Abs Immature Granulocytes: 0.02 10*3/uL (ref 0.00–0.07)
Basophils Absolute: 0 10*3/uL (ref 0.0–0.1)
Basophils Relative: 1 %
Eosinophils Absolute: 0.2 10*3/uL (ref 0.0–0.5)
Eosinophils Relative: 3 %
HCT: 44 % (ref 36.0–46.0)
Hemoglobin: 14.8 g/dL (ref 12.0–15.0)
Immature Granulocytes: 0 %
Lymphocytes Relative: 27 %
Lymphs Abs: 1.6 10*3/uL (ref 0.7–4.0)
MCH: 34.8 pg — ABNORMAL HIGH (ref 26.0–34.0)
MCHC: 33.6 g/dL (ref 30.0–36.0)
MCV: 103.5 fL — ABNORMAL HIGH (ref 80.0–100.0)
Monocytes Absolute: 0.6 10*3/uL (ref 0.1–1.0)
Monocytes Relative: 10 %
Neutro Abs: 3.6 10*3/uL (ref 1.7–7.7)
Neutrophils Relative %: 59 %
Platelets: 244 10*3/uL (ref 150–400)
RBC: 4.25 MIL/uL (ref 3.87–5.11)
RDW: 13 % (ref 11.5–15.5)
WBC: 6.1 10*3/uL (ref 4.0–10.5)
nRBC: 0 % (ref 0.0–0.2)

## 2022-08-01 LAB — CBC
HCT: 40.6 % (ref 36.0–46.0)
Hemoglobin: 13.3 g/dL (ref 12.0–15.0)
MCH: 34.4 pg — ABNORMAL HIGH (ref 26.0–34.0)
MCHC: 32.8 g/dL (ref 30.0–36.0)
MCV: 104.9 fL — ABNORMAL HIGH (ref 80.0–100.0)
Platelets: 211 10*3/uL (ref 150–400)
RBC: 3.87 MIL/uL (ref 3.87–5.11)
RDW: 12.9 % (ref 11.5–15.5)
WBC: 5.7 10*3/uL (ref 4.0–10.5)
nRBC: 0 % (ref 0.0–0.2)

## 2022-08-01 LAB — BASIC METABOLIC PANEL
Anion gap: 10 (ref 5–15)
Anion gap: 15 (ref 5–15)
BUN: 28 mg/dL — ABNORMAL HIGH (ref 8–23)
BUN: 34 mg/dL — ABNORMAL HIGH (ref 8–23)
CO2: 20 mmol/L — ABNORMAL LOW (ref 22–32)
CO2: 20 mmol/L — ABNORMAL LOW (ref 22–32)
Calcium: 9 mg/dL (ref 8.9–10.3)
Calcium: 9.9 mg/dL (ref 8.9–10.3)
Chloride: 108 mmol/L (ref 98–111)
Chloride: 103 mmol/L (ref 98–111)
Creatinine, Ser: 0.97 mg/dL (ref 0.44–1.00)
Creatinine, Ser: 0.95 mg/dL (ref 0.44–1.00)
GFR, Estimated: 58 mL/min — ABNORMAL LOW (ref >60)
GFR, Estimated: 60 mL/min — ABNORMAL LOW (ref >60)
Glucose, Bld: 137 mg/dL — ABNORMAL HIGH (ref 70–99)
Glucose, Bld: 156 mg/dL — ABNORMAL HIGH (ref 70–99)
Potassium: 3.8 mmol/L (ref 3.5–5.1)
Potassium: 3.8 mmol/L (ref 3.5–5.1)
Sodium: 138 mmol/L (ref 135–145)
Sodium: 138 mmol/L (ref 135–145)

## 2022-08-01 LAB — LIPID PANEL
Cholesterol: 116 mg/dL (ref 0–200)
HDL: 38 mg/dL — ABNORMAL LOW (ref >40)
LDL Cholesterol: 48 mg/dL (ref 0–99)
Total CHOL/HDL Ratio: 3.1 RATIO
Triglycerides: 148 mg/dL (ref <150)
VLDL: 30 mg/dL (ref 0–40)

## 2022-08-01 LAB — GLUCOSE, CAPILLARY: Glucose-Capillary: 166 mg/dL — ABNORMAL HIGH (ref 70–99)

## 2022-08-01 LAB — TROPONIN I (HIGH SENSITIVITY): Troponin I (High Sensitivity): 1082 ng/L (ref <18)

## 2022-08-01 MED ORDER — TICAGRELOR 90 MG PO TABS
90.0000 mg | ORAL_TABLET | Freq: Two times a day (BID) | ORAL | 2 refills | Status: DC
  Filled 2022-08-01: qty 180, 90d supply, fill #0

## 2022-08-01 MED ORDER — METOPROLOL TARTRATE 25 MG PO TABS
25.0000 mg | ORAL_TABLET | Freq: Two times a day (BID) | ORAL | Status: DC
  Administered 2022-08-01 – 2022-08-03 (×4): 25 mg via ORAL
  Filled 2022-08-01 (×4): qty 1

## 2022-08-01 MED ORDER — METOPROLOL TARTRATE 25 MG PO TABS
25.0000 mg | ORAL_TABLET | Freq: Two times a day (BID) | ORAL | 0 refills | Status: DC
  Filled 2022-08-01: qty 180, 90d supply, fill #0

## 2022-08-01 MED ORDER — ONDANSETRON HCL 4 MG/2ML IJ SOLN: 4.0000 mg | Freq: Four times a day (QID) | INTRAMUSCULAR | Status: DC | PRN

## 2022-08-01 MED ORDER — PANTOPRAZOLE SODIUM 40 MG PO TBEC
40.0000 mg | DELAYED_RELEASE_TABLET | Freq: Every day | ORAL | Status: DC
  Administered 2022-08-01 – 2022-08-03 (×3): 40 mg via ORAL
  Filled 2022-08-01 (×3): qty 1

## 2022-08-01 MED ORDER — NITROGLYCERIN IN D5W 200-5 MCG/ML-% IV SOLN
0.0000 ug/min | INTRAVENOUS | Status: DC
  Administered 2022-08-01: 5 ug/min via INTRAVENOUS
  Filled 2022-08-01: qty 250

## 2022-08-01 MED ORDER — ACETAMINOPHEN 500 MG PO TABS: 1000.0000 mg | ORAL_TABLET | Freq: Four times a day (QID) | ORAL | Status: DC | PRN

## 2022-08-01 MED ORDER — ACETAMINOPHEN 325 MG PO TABS
650.0000 mg | ORAL_TABLET | ORAL | Status: DC | PRN
  Administered 2022-08-02 – 2022-08-03 (×3): 650 mg via ORAL
  Filled 2022-08-01 (×3): qty 2

## 2022-08-01 MED ORDER — NITROGLYCERIN 0.4 MG SL SUBL: 0.4000 mg | SUBLINGUAL_TABLET | SUBLINGUAL | Status: DC | PRN

## 2022-08-01 MED ORDER — AMLODIPINE BESYLATE 5 MG PO TABS: 10.0000 mg | ORAL_TABLET | Freq: Every day | ORAL | Status: DC

## 2022-08-01 MED ORDER — ASPIRIN 300 MG RE SUPP: 300.0000 mg | RECTAL | Status: AC

## 2022-08-01 MED ORDER — MORPHINE SULFATE (PF) 2 MG/ML IV SOLN: 2.0000 mg | INTRAVENOUS | Status: DC | PRN

## 2022-08-01 MED ORDER — NITROGLYCERIN 0.4 MG SL SUBL
0.4000 mg | SUBLINGUAL_TABLET | SUBLINGUAL | 2 refills | Status: DC | PRN
  Filled 2022-08-01: qty 25, 8d supply, fill #0

## 2022-08-01 MED ORDER — LISINOPRIL 20 MG PO TABS
20.0000 mg | ORAL_TABLET | Freq: Every day | ORAL | Status: DC
  Administered 2022-08-02 – 2022-08-03 (×2): 20 mg via ORAL
  Filled 2022-08-01 (×2): qty 1

## 2022-08-01 MED ORDER — ASPIRIN 81 MG PO TBEC: 81.0000 mg | DELAYED_RELEASE_TABLET | Freq: Every day | ORAL | Status: DC

## 2022-08-01 MED ORDER — ASPIRIN 81 MG PO CHEW
324.0000 mg | CHEWABLE_TABLET | ORAL | Status: AC
  Administered 2022-08-01: 324 mg via ORAL
  Filled 2022-08-01: qty 4

## 2022-08-01 MED ORDER — ATORVASTATIN CALCIUM 80 MG PO TABS
80.0000 mg | ORAL_TABLET | Freq: Every day | ORAL | 1 refills | Status: AC
  Filled 2022-08-01: qty 90, 90d supply, fill #0

## 2022-08-01 MED ORDER — SODIUM CHLORIDE 0.9 % IV SOLN: INTRAVENOUS | Status: DC

## 2022-08-01 MED ORDER — NITROGLYCERIN IN D5W 200-5 MCG/ML-% IV SOLN
0.0000 ug/min | INTRAVENOUS | Status: DC
  Administered 2022-08-01: 5 ug/min via INTRAVENOUS

## 2022-08-01 MED ORDER — ASPIRIN 81 MG PO TBEC
81.0000 mg | DELAYED_RELEASE_TABLET | Freq: Every day | ORAL | Status: DC
  Administered 2022-08-02 – 2022-08-03 (×2): 81 mg via ORAL
  Filled 2022-08-01 (×2): qty 1

## 2022-08-01 MED ORDER — AMLODIPINE BESYLATE 10 MG PO TABS
10.0000 mg | ORAL_TABLET | Freq: Every day | ORAL | Status: DC
  Administered 2022-08-02 – 2022-08-03 (×2): 10 mg via ORAL
  Filled 2022-08-01 (×2): qty 1

## 2022-08-01 MED ORDER — CHLORHEXIDINE GLUCONATE CLOTH 2 % EX PADS
6.0000 | MEDICATED_PAD | Freq: Every day | CUTANEOUS | Status: DC
  Administered 2022-08-01 – 2022-08-02 (×2): 6 via TOPICAL

## 2022-08-01 MED ORDER — HEPARIN (PORCINE) 25000 UT/250ML-% IV SOLN
850.0000 [IU]/h | INTRAVENOUS | Status: DC
  Administered 2022-08-01: 750 [IU]/h via INTRAVENOUS
  Filled 2022-08-01: qty 250

## 2022-08-01 MED ORDER — ATORVASTATIN CALCIUM 80 MG PO TABS
80.0000 mg | ORAL_TABLET | Freq: Every day | ORAL | Status: DC
  Administered 2022-08-02 – 2022-08-03 (×2): 80 mg via ORAL
  Filled 2022-08-01 (×2): qty 1

## 2022-08-01 MED ORDER — LISINOPRIL 20 MG PO TABS
20.0000 mg | ORAL_TABLET | Freq: Every day | ORAL | 0 refills | Status: DC
  Filled 2022-08-01: qty 90, 90d supply, fill #0

## 2022-08-01 MED ORDER — ORAL CARE MOUTH RINSE: 15.0000 mL | OROMUCOSAL | Status: DC | PRN

## 2022-08-01 MED ORDER — TICAGRELOR 90 MG PO TABS
90.0000 mg | ORAL_TABLET | Freq: Two times a day (BID) | ORAL | Status: DC
  Administered 2022-08-01 – 2022-08-03 (×4): 90 mg via ORAL
  Filled 2022-08-01 (×4): qty 1

## 2022-08-01 MED ORDER — METOPROLOL TARTRATE 25 MG PO TABS
25.0000 mg | ORAL_TABLET | Freq: Two times a day (BID) | ORAL | Status: DC
  Administered 2022-08-01: 25 mg via ORAL
  Filled 2022-08-01: qty 1

## 2022-08-01 MED ORDER — HEPARIN BOLUS VIA INFUSION
3800.0000 [IU] | Freq: Once | INTRAVENOUS | Status: AC
  Administered 2022-08-01: 3800 [IU] via INTRAVENOUS
  Filled 2022-08-01: qty 3800

## 2022-08-01 NOTE — Care Management Important Message (Signed)
Important Message  Patient Details  Name: Cassandra Brooks MRN: [id number] Date of Birth: 1940-08-21   Medicare Important Message Given:  Yes     Renie Ora 08/01/2022, 11:19 AM

## 2022-08-01 NOTE — Progress Notes (Signed)
[name] to be D/C'd home per MD order. Charge RN Judeth Cornfield discussed with the patient and all questions fully answered. TOC meds picked up from pharmacy. Skin clean, dry and intact without evidence of skin break down, no evidence of skin tears noted. R radial site clean and intact. IV catheter discontinued intact. Site without signs and symptoms of complications. Dressing and pressure applied.  An After Visit Summary was printed and given to the patient.  Patient escorted via WC, and D/C home via private auto.  Jon Gills  08/01/2022 11:50 AM

## 2022-08-01 NOTE — Progress Notes (Signed)
ANTICOAGULATION CONSULT NOTE - Initial Consult  Pharmacy Consult for heparin Indication: chest pain/ACS  No Known Allergies  Patient Measurements:   Heparin Dosing Weight: 64kg  Vital Signs: Temp: 97.6 F (36.4 C) (04/22 1740) BP: 151/74 (04/22 1740) Pulse Rate: 87 (04/22 1755)  Labs: Recent Labs    07/30/22 0144 07/31/22 0202 08/01/22 0236 08/01/22 1756  HGB 12.0 12.4 13.3 14.8  HCT 35.6* 37.4 40.6 44.0  PLT 167 191 211 244  CREATININE 0.82 0.97 0.97  --     Estimated Creatinine Clearance: 41.6 mL/min (by C-G formula based on SCr of 0.97 mg/dL).   Medical History: Past Medical History:  Diagnosis Date   AAA (abdominal aortic aneurysm)    Arthritis    "knees" (03/21/2017)   Borderline diabetes    Bulging lumbar disc    Depression    Disequilibrium    Dizziness    Essential hypertension 12/19/2019   Fall    Fibromyalgia    GERD (gastroesophageal reflux disease)    Glaucoma, both eyes    Hemoptysis 03/18/2015   High cholesterol    "don't have it but I take RX" (03/21/2017)   Hypertension    Mixed dyslipidemia 12/19/2019   Nuclear cataract of both eyes 01/01/2014   Pre-diabetes    pt states she is not diabetic. Last A1C was normal. Pt is on no medications for DM   Preoperative cardiovascular examination 12/19/2019   Primary osteoarthritis of left knee 07/18/2019   Solitary pulmonary nodule 03/18/2015   03/2015 6mm RLL nodule    Status post AAA (abdominal aortic aneurysm) repair 12/19/2019   Status post total left knee replacement 03/27/2020   Syncope 03/21/2017   Syncope and collapse     Assessment: 61 YOF presenting with CP, discharged this AM for same s/p cath/PCI, on DAPT.    Goal of Therapy:  Heparin level 0.3-0.7 units/ml Monitor platelets by anticoagulation protocol: Yes   Plan:  Heparin 3800 units IV x 1, and gtt at 750 units/hr F/u 8 hour heparin level F/u cards plan  Daylene Posey, PharmD, [facility] Clinical Pharmacist ED Pharmacist  Phone # [phone] 08/01/2022 7:21 PM

## 2022-08-01 NOTE — H&P (Addendum)
Cardiology Admission History and Physical   Patient ID: Cassandra Brooks MRN: [id number]; DOB: 01/14/1941   Admission date: 08/01/2022  PCP:  Hurshel Party, NP   [facility] Providers Cardiologist:  Garwin Brothers, MD        Chief Complaint:  chest pain   Patient Profile:   Cassandra Brooks is a 82 y.o. female with no prior cardiac hx but admitted 07/28/22 for STEMI , she does have HTN, HLD, DM hx AAA repair, she had cath with PCI to PDA no stent due to small size of vessel. At end of cath only residual 20% stenosis with TIMI 3 flow. Other wise mild CAD was discharged today who is being seen 08/01/2022 for the evaluation of chest pain.Marland Kitchen  History of Present Illness:   Cassandra Brooks is an 82 year old female with a history of CAD status post inferior STEMI 07/2022, hypertension, hyperlipidemia, diabetes, AAA repair who presents with chest pain.  She was admitted with inferior STEMI 07/29/2022.  Cath showed occlusion of small right PDA, underwent POBA but no stent was placed due to small vessel size; TIMI-3 flow was achieved.  Otherwise patient had mild nonobstructive disease with 40% mid LAD stenosis, 40% OM1, 20% proximal LCx.  Echocardiogram showed EF 65 to 70%.  She was discharged earlier today on aspirin and Brilinta.  States that today around 2 PM began having onset of chest pain.  Described as pressure in center of chest but also with burning sensation.  States that with her STEMI her pain was more in her shoulders and back but today it was more in her chest.  Given symptoms, prompted her to present to the ED today.  Initial vital signs showed BP 151/74, pulse 96, SpO2 95% on room air.  Labs are pending.  EKG today shows sinus rhythm, rate 84, 1 mm inferior ST elevations.  Chest x-ray unremarkable    Past Medical History:  Diagnosis Date   AAA (abdominal aortic aneurysm)    Arthritis    "knees" (03/21/2017)   Borderline diabetes    Bulging lumbar disc    Depression     Disequilibrium    Dizziness    Essential hypertension 12/19/2019   Fall    Fibromyalgia    GERD (gastroesophageal reflux disease)    Glaucoma, both eyes    Hemoptysis 03/18/2015   High cholesterol    "don't have it but I take RX" (03/21/2017)   Hypertension    Mixed dyslipidemia 12/19/2019   Nuclear cataract of both eyes 01/01/2014   Pre-diabetes    pt states she is not diabetic. Last A1C was normal. Pt is on no medications for DM   Preoperative cardiovascular examination 12/19/2019   Primary osteoarthritis of left knee 07/18/2019   Solitary pulmonary nodule 03/18/2015   03/2015 6mm RLL nodule    Status post AAA (abdominal aortic aneurysm) repair 12/19/2019   Status post total left knee replacement 03/27/2020   Syncope 03/21/2017   Syncope and collapse     Past Surgical History:  Procedure Laterality Date   ABDOMINAL AORTIC ANEURYSM REPAIR  1966   APPENDECTOMY  1966   CARDIAC CATHETERIZATION     20+ years ago, no abnormalities found   COLONOSCOPY     CORONARY/GRAFT ACUTE MI REVASCULARIZATION N/A 07/29/2022   Procedure: Coronary/Graft Acute MI Revascularization;  Surgeon: Lennette Bihari, MD;  Location: [facility] INVASIVE CV LAB;  Service: Cardiovascular;  Laterality: N/A;   LEFT HEART CATH AND CORONARY ANGIOGRAPHY N/A 07/29/2022  Procedure: LEFT HEART CATH AND CORONARY ANGIOGRAPHY;  Surgeon: Lennette Bihari, MD;  Location: [facility] INVASIVE CV LAB;  Service: Cardiovascular;  Laterality: N/A;   LUMBAR LAMINECTOMY/DECOMPRESSION MICRODISCECTOMY N/A 04/26/2021   Procedure: CENTRAL LAMINECTOMIES LUMBAR TWO-THREE,  LUMBAR THREE-FOUR WITH BILATERAL FORAMINOTOMIES;  Surgeon: Kerrin Champagne, MD;  Location: [facility] OR;  Service: Orthopedics;  Laterality: N/A;   TOTAL KNEE ARTHROPLASTY Left 03/27/2020   Procedure: LEFT TOTAL KNEE ARTHROPLASTY;  Surgeon: Tarry Kos, MD;  Location: [facility] OR;  Service: Orthopedics;  Laterality: Left;   VAGINAL HYSTERECTOMY     "partial"   VIDEO BRONCHOSCOPY Bilateral  03/19/2015   Procedure: VIDEO BRONCHOSCOPY WITHOUT FLUORO;  Surgeon: Oretha Milch, MD;  Location: [facility] ENDOSCOPY;  Service: Cardiopulmonary;  Laterality: Bilateral;     Medications Prior to Admission: Prior to Admission medications   Medication Sig Start Date End Date Taking? Authorizing Provider  acetaminophen (TYLENOL) 500 MG tablet Take 1,000 mg by mouth every 6 (six) hours as needed for mild pain.    [provider]  amLODipine (NORVASC) 10 MG tablet Take 10 mg by mouth daily.    [provider]  amoxicillin (AMOXIL) 500 MG capsule Take 4 pills one hour prior to dental work Patient not taking: Reported on 07/29/2022 03/18/22   Cristie Hem, PA-C  amoxicillin (AMOXIL) 500 MG tablet take 4 pills one hour prior to dental work 05/17/21   Cristie Hem, PA-C  aspirin EC 81 MG tablet Take 81 mg by mouth daily. Swallow whole.    [provider]  atorvastatin (LIPITOR) 80 MG tablet Take 1 tablet (80 mg total) by mouth daily. 08/02/22   Arty Baumgartner, NP  diclofenac Sodium (VOLTAREN) 1 % GEL Apply 2 g topically 4 (four) times daily. 10/19/21   Cristie Hem, PA-C  docusate sodium (COLACE) 100 MG capsule Take 1 capsule (100 mg total) by mouth 2 (two) times daily. Patient not taking: Reported on 07/29/2022 04/27/21   Kerrin Champagne, MD  dorzolamide-timolol (COSOPT) 22.3-6.8 MG/ML ophthalmic solution Place 1 drop into both eyes 2 (two) times daily.    [provider]  fluticasone (FLONASE) 50 MCG/ACT nasal spray Place 2 sprays into both nostrils 2 (two) times daily. 11/08/19   [provider]  lisinopril (ZESTRIL) 20 MG tablet Take 1 tablet (20 mg total) by mouth daily. 08/02/22   Arty Baumgartner, NP  LUMIGAN 0.01 % SOLN Place 1 drop into both eyes at bedtime. 12/12/19   [provider]  methocarbamol (ROBAXIN) 500 MG tablet Take 1 tablet (500 mg total) by mouth every 6 (six) hours as needed for muscle spasms. Patient not taking: Reported on  07/29/2022 04/27/21   Kerrin Champagne, MD  metoprolol tartrate (LOPRESSOR) 25 MG tablet Take 1 tablet (25 mg total) by mouth 2 (two) times daily. 08/01/22   Arty Baumgartner, NP  nabumetone (RELAFEN) 750 MG tablet Take 750 mg by mouth 2 (two) times daily. 02/21/17   [provider]  nitroGLYCERIN (NITROSTAT) 0.4 MG SL tablet Place 1 tablet (0.4 mg total) under the tongue every 5 (five) minutes as needed. 08/01/22   Arty Baumgartner, NP  omeprazole (PRILOSEC) 20 MG capsule Take 20 mg by mouth daily.    [provider]  ticagrelor (BRILINTA) 90 MG TABS tablet Take 1 tablet (90 mg total) by mouth 2 (two) times daily. 08/01/22   Arty Baumgartner, NP  venlafaxine XR (EFFEXOR-XR) 150 MG 24 hr capsule Take 1 capsule (150  mg total) by mouth daily with breakfast. 03/28/17   Oralia Manis, DO     Allergies:   No Known Allergies  Social History:   Social History   Socioeconomic History   Marital status: Married    Spouse name: Not on file   Number of children: Not on file   Years of education: Not on file   Highest education level: Not on file  Occupational History   Occupation: retired    Comment: Designer, fashion/clothing; dietician  Tobacco Use   Smoking status: Never   Smokeless tobacco: Never  Vaping Use   Vaping Use: Never used  Substance and Sexual Activity   Alcohol use: No   Drug use: No   Sexual activity: Not on file  Other Topics Concern   Not on file  Social History Narrative   Not on file   Social Determinants of Health   Financial Resource Strain: Not on file  Food Insecurity: Not on file  Transportation Needs: Not on file  Physical Activity: Not on file  Stress: Not on file  Social Connections: Not on file  Intimate Partner Violence: Not on file    Family History:   The patient's family history includes Colon cancer in her brother; Diabetes in her brother, brother, mother, and sister; Glaucoma in her mother; Hypertension in her father; Lung cancer in her  brother; Lupus in her child; Stomach cancer in her father.    ROS:  Please see the history of present illness.    Physical Exam/Data:   Vitals:   08/01/22 1740 08/01/22 1755  BP: (!) 151/74   Pulse: 96 87  Resp: 17 12  Temp: 97.6 F (36.4 C)   SpO2: 95% 97%   No intake or output data in the 24 hours ending 08/01/22 1942    07/30/2022   11:45 PM 07/28/2022   10:57 PM 11/22/2021    7:47 AM  Last 3 Weights  Weight (lbs) 166 lb 8 oz 165 lb 175 lb  Weight (kg) 75.524 kg 74.844 kg 79.379 kg     There is no height or weight on file to calculate BMI.  GEN:  appears uncomfortable but in no acute distress HEENT: normal Neck: no JVD Cardiac: RRR; no murmurs, rubs, or gallops,no edema  Respiratory:  clear to auscultation bilaterally, normal work of breathing GI: soft, nontender, nondistended MS: no deformity or atrophy Skin: warm and dry, no rash Neuro:  Alert and Oriented x 3, Strength and sensation are intact Psych: normal affect.   Relevant CV Studies: Cath: 07/29/2022     2nd Diag lesion is 20% stenosed.   Mid LAD lesion is 40% stenosed.   Prox Cx to Mid Cx lesion is 20% stenosed.   1st Mrg lesion is 30% stenosed.   Prox RCA lesion is 20% stenosed.   RPDA lesion is 100% stenosed.   Balloon angioplasty was performed.   Post intervention, there is a 20% residual stenosis.   The left ventricular systolic function is normal.   LV end diastolic pressure is normal.   The left ventricular ejection fraction is 50-55% by visual estimate.   Inferior STEMI secondary to total occlusion of a small posterior descending artery branch of a dominant RCA.   Mild concomitant CAD with 40% mid LAD stenosis calcified segment; left circumflex 40% diffuse OM1 stenosis with 20% proximal circumflex stenosis, and mild irregularity of the dominant RCA with total occlusion of the PDA and large posterolateral vessel.   Preserved global LV contractility with  EF estimate approximately 55%.  V EDP 15  mmHg.   With the patient still having chest pain, PCI was performed to the PDA vessel which turned out to be a small bifurcating vessel.  Due to vessel size, stenting was not done.  The 100% occlusion was reduced to 20% with resumption of TIMI-3 flow.     RECOMMENDATION: DAPT with aspirin/Brilinta.  Medical therapy for concomitant CAD.  Aggressive lipid-lowering therapy with target LDL less than 55 and optimal blood pressure control.  Patient will be maintained on low-dose IV nitroglycerin post procedure.  Since stenting was not performed, will resume heparin following TR band removal. _____________   Diagnostic Dominance: Right  Intervention     ECHO 07/31/22 IMPRESSIONS     1. Left ventricular ejection fraction, by estimation, is 65 to 70%. The  left ventricle has normal function. The left ventricle has no regional  wall motion abnormalities. There is mild asymmetric left ventricular  hypertrophy of the basal-septal segment.  Left ventricular diastolic function could not be evaluated.   2. Right ventricular systolic function is hyperdynamic. The right  ventricular size is normal. Tricuspid regurgitation signal is inadequate  for assessing PA pressure.   3. Left atrial size was mildly dilated.   4. The mitral valve is grossly normal. No evidence of mitral valve  regurgitation. No evidence of mitral stenosis. Moderate mitral annular  calcification.   5. The aortic valve is tricuspid. Aortic valve regurgitation is not  visualized. No aortic stenosis is present.   6. The inferior vena cava is normal in size with greater than 50%  respiratory variability, suggesting right atrial pressure of 3 mmHg.   Comparison(s): LVEF more vigorous from prior reporting.   FINDINGS   Left Ventricle: Left ventricular ejection fraction, by estimation, is 65  to 70%. The left ventricle has normal function. The left ventricle has no  regional wall motion abnormalities. Definity contrast agent was  given IV  to delineate the left ventricular   endocardial borders. The left ventricular internal cavity size was normal  in size. There is mild asymmetric left ventricular hypertrophy of the  basal-septal segment. Left ventricular diastolic function could not be  evaluated due to mitral annular  calcification (moderate or greater). Left ventricular diastolic function  could not be evaluated.   Right Ventricle: The right ventricular size is normal. No increase in  right ventricular wall thickness. Right ventricular systolic function is  hyperdynamic. Tricuspid regurgitation signal is inadequate for assessing  PA pressure.   Left Atrium: Left atrial size was mildly dilated.   Right Atrium: Right atrial size was normal in size.   Pericardium: There is no evidence of pericardial effusion. Presence of  epicardial fat layer.   Mitral Valve: The mitral valve is grossly normal. Moderate mitral annular  calcification. No evidence of mitral valve regurgitation. No evidence of  mitral valve stenosis.   Tricuspid Valve: The tricuspid valve is normal in structure. Tricuspid  valve regurgitation is mild . No evidence of tricuspid stenosis.   Aortic Valve: The aortic valve is tricuspid. Aortic valve regurgitation is  not visualized. No aortic stenosis is present. Aortic valve mean gradient  measures 4.0 mmHg. Aortic valve peak gradient measures 6.6 mmHg. Aortic  valve area, by VTI measures 2.13  cm.   Pulmonic Valve: The pulmonic valve was not well visualized. Pulmonic valve  regurgitation is not visualized. No evidence of pulmonic stenosis.   Aorta: The aortic root and ascending aorta are structurally normal, with  no evidence of dilitation.   Venous: The inferior vena cava is normal in size with greater than 50%  respiratory variability, suggesting right atrial pressure of 3 mmHg.   IAS/Shunts: No atrial level shunt detected by color flow Doppler.   Laboratory Data:  High  Sensitivity Troponin:   Recent Labs  Lab 07/28/22 2257 07/29/22 0044 07/29/22 0557 08/01/22 1756  TROPONINIHS 8,901* 9,078* 15,064* 1,082*      Chemistry Recent Labs  Lab 08/01/22 0236 08/01/22 1756  NA 138 138  K 3.8 3.8  CL 108 103  CO2 20* 20*  GLUCOSE 137* 156*  BUN 28* 34*  CREATININE 0.97 0.95  CALCIUM 9.0 9.9  GFRNONAA 58* 60*  ANIONGAP 10 15    No results for input(s): "PROT", "ALBUMIN", "AST", "ALT", "ALKPHOS", "BILITOT" in the last 168 hours. Lipids  Recent Labs  Lab 08/01/22 0236  CHOL 116  TRIG 148  HDL 38*  LDLCALC 48  CHOLHDL 3.1   Hematology Recent Labs  Lab 08/01/22 0236 08/01/22 1756  WBC 5.7 6.1  RBC 3.87 4.25  HGB 13.3 14.8  HCT 40.6 44.0  MCV 104.9* 103.5*  MCH 34.4* 34.8*  MCHC 32.8 33.6  RDW 12.9 13.0  PLT 211 244   Thyroid No results for input(s): "TSH", "FREET4" in the last 168 hours. BNPNo results for input(s): "BNP", "PROBNP" in the last 168 hours.  DDimer No results for input(s): "DDIMER" in the last 168 hours.   Radiology/Studies:  DG Chest Portable 1 View  Result Date: 08/01/2022 CLINICAL DATA:  Chest pain. EXAM: PORTABLE CHEST 1 VIEW COMPARISON:  July 28, 2022. FINDINGS: The heart size and mediastinal contours are within normal limits. Both lungs are clear. The visualized skeletal structures are unremarkable. IMPRESSION: No active disease. Electronically Signed   By: Lupita Raider M.D.   On: 08/01/2022 18:19     Assessment and Plan:   STEMI: She was admitted with inferior STEMI 07/29/2022.  Cath showed occlusion of small right PDA, underwent POBA but no stent was placed due to small vessel size; TIMI-3 flow was achieved.  Otherwise patient had mild nonobstructive disease with 40% mid LAD stenosis, 40% OM1, 20% proximal LCx.  Now presenting back to the ED today with chest tightness and inferior ST elevations.  Discussed case with Dr. Allyson Sabal, who reviewed prior cath films.  Suspect likely right PDA that was ballooned has  occluded.  Given small size of vessel (~ 1 mm) did not recommend taking back to Cath Lab but rather would manage medically -Admit to 2H -Trend troponins -Start heparin drip.  Continue aspirin, Brilinta -Start nitro drip for chest pain.  IV morphine for severe pain -Suspect component of chest pain is GERD, she reports both burning pain in chest and chest tightness.  Start Protonix -Continue metoprolol 25 mg twice daily -Limited echo to evaluate systolic function  Hypertension: On amlodipine 10 mg daily, lisinopril 20 mg daily, metoprolol 25 mg twice daily -Continue home meds  Hyperlipidemia: Continue atorvastatin 80 mg daily, LDL 48   Risk Assessment/Risk Scores:    TIMI Risk Score for ST  Elevation MI:   The patient's TIMI risk score is 5, which indicates a 12.4% risk of all cause mortality at 30 days.        Severity of Illness: The appropriate patient status for this patient is INPATIENT. Inpatient status is judged to be reasonable and necessary in order to provide the required intensity of service to ensure the patient's safety. The patient's presenting  symptoms, physical exam findings, and initial radiographic and laboratory data in the context of their chronic comorbidities is felt to place them at high risk for further clinical deterioration. Furthermore, it is not anticipated that the patient will be medically stable for discharge from the hospital within 2 midnights of admission.   * I certify that at the point of admission it is my clinical judgment that the patient will require inpatient hospital care spanning beyond 2 midnights from the point of admission due to high intensity of service, high risk for further deterioration and high frequency of surveillance required.*   For questions or updates, please contact [facility] Please consult [email or web address] for contact info under     Signed, Little Ishikawa, MD  08/01/2022 7:42 PM

## 2022-08-01 NOTE — ED Notes (Signed)
.ED TO INPATIENT HANDOFF REPORT  ED Nurse Name and Phone #: [phone]  S Name/Age/Gender Cassandra Brooks 82 y.o. female Room/Bed: 002C/002C  Code Status   Code Status: Prior  Home/SNF/Other Home Patient oriented to: self, place, time, and situation Is this baseline? Yes   Triage Complete: Triage complete  Chief Complaint ACS (acute coronary syndrome) [I24.9]  Triage Note Pt DC from 6E this morning, went home & began having centralized "dull" CP & feeling of "indigestion." Admitted 4/19- 4/22 for STEMI, cath, similar symptoms   Allergies No Known Allergies  Level of Care/Admitting Diagnosis ED Disposition     ED Disposition  Admit   Condition  --   Comment  Hospital Area: [facility] [[id number]]  Level of Care: ICU [6]  May admit patient to [facility] or [facility] if equivalent level of care is available:: No  Covid Evaluation: Asymptomatic - no recent exposure (last 10 days) testing not required  Diagnosis: ACS (acute coronary syndrome) [960454]  Admitting Physician: Little Ishikawa [[id number]]  Attending Physician: Little Ishikawa [phone]  Certification:: I certify this patient will need inpatient services for at least 2 midnights  Estimated Length of Stay: 2          B Medical/Surgery History Past Medical History:  Diagnosis Date   AAA (abdominal aortic aneurysm)    Arthritis    "knees" (03/21/2017)   Borderline diabetes    Bulging lumbar disc    Depression    Disequilibrium    Dizziness    Essential hypertension 12/19/2019   Fall    Fibromyalgia    GERD (gastroesophageal reflux disease)    Glaucoma, both eyes    Hemoptysis 03/18/2015   High cholesterol    "don't have it but I take RX" (03/21/2017)   Hypertension    Mixed dyslipidemia 12/19/2019   Nuclear cataract of both eyes 01/01/2014   Pre-diabetes    pt states she is not diabetic. Last A1C was normal. Pt is on no medications for DM   Preoperative  cardiovascular examination 12/19/2019   Primary osteoarthritis of left knee 07/18/2019   Solitary pulmonary nodule 03/18/2015   03/2015 6mm RLL nodule    Status post AAA (abdominal aortic aneurysm) repair 12/19/2019   Status post total left knee replacement 03/27/2020   Syncope 03/21/2017   Syncope and collapse    Past Surgical History:  Procedure Laterality Date   ABDOMINAL AORTIC ANEURYSM REPAIR  1966   APPENDECTOMY  1966   CARDIAC CATHETERIZATION     20+ years ago, no abnormalities found   COLONOSCOPY     CORONARY/GRAFT ACUTE MI REVASCULARIZATION N/A 07/29/2022   Procedure: Coronary/Graft Acute MI Revascularization;  Surgeon: Lennette Bihari, MD;  Location: [facility];  Service: Cardiovascular;  Laterality: N/A;   LEFT HEART CATH AND CORONARY ANGIOGRAPHY N/A 07/29/2022   Procedure: LEFT HEART CATH AND CORONARY ANGIOGRAPHY;  Surgeon: Lennette Bihari, MD;  Location: [facility];  Service: Cardiovascular;  Laterality: N/A;   LUMBAR LAMINECTOMY/DECOMPRESSION MICRODISCECTOMY N/A 04/26/2021   Procedure: CENTRAL LAMINECTOMIES LUMBAR TWO-THREE,  LUMBAR THREE-FOUR WITH BILATERAL FORAMINOTOMIES;  Surgeon: Kerrin Champagne, MD;  Location: [facility] OR;  Service: Orthopedics;  Laterality: N/A;   TOTAL KNEE ARTHROPLASTY Left 03/27/2020   Procedure: LEFT TOTAL KNEE ARTHROPLASTY;  Surgeon: Tarry Kos, MD;  Location: [facility] OR;  Service: Orthopedics;  Laterality: Left;   VAGINAL HYSTERECTOMY     "partial"   VIDEO BRONCHOSCOPY Bilateral 03/19/2015   Procedure: VIDEO  BRONCHOSCOPY WITHOUT FLUORO;  Surgeon: Oretha Milch, MD;  Location: [facility];  Service: Cardiopulmonary;  Laterality: Bilateral;     A IV Location/Drains/Wounds Patient Lines/Drains/Airways Status     Active Line/Drains/Airways     Name Placement date Placement time Site Days   Peripheral IV 08/01/22 18 G Right Antecubital 08/01/22  1747  Antecubital  less than 1            Intake/Output Last 24 hours No intake or  output data in the 24 hours ending 08/01/22 1907  Labs/Imaging Results for orders placed or performed during the hospital encounter of 08/01/22 (from the past 48 hour(s))  CBC with Differential     Status: Abnormal   Collection Time: 08/01/22  5:56 PM  Result Value Ref Range   WBC 6.1 4.0 - 10.5 K/uL   RBC 4.25 3.87 - 5.11 MIL/uL   Hemoglobin 14.8 12.0 - 15.0 g/dL   HCT 16.1 09.6 - 04.5 %   MCV 103.5 (H) 80.0 - 100.0 fL   MCH 34.8 (H) 26.0 - 34.0 pg   MCHC 33.6 30.0 - 36.0 g/dL   RDW 40.9 81.1 - 91.4 %   Platelets 244 150 - 400 K/uL   nRBC 0.0 0.0 - 0.2 %   Neutrophils Relative % 59 %   Neutro Abs 3.6 1.7 - 7.7 K/uL   Lymphocytes Relative 27 %   Lymphs Abs 1.6 0.7 - 4.0 K/uL   Monocytes Relative 10 %   Monocytes Absolute 0.6 0.1 - 1.0 K/uL   Eosinophils Relative 3 %   Eosinophils Absolute 0.2 0.0 - 0.5 K/uL   Basophils Relative 1 %   Basophils Absolute 0.0 0.0 - 0.1 K/uL   Immature Granulocytes 0 %   Abs Immature Granulocytes 0.02 0.00 - 0.07 K/uL    Comment: Performed at [facility], [address], [place], [place] [address]   DG Chest Portable 1 View  Result Date: 08/01/2022 CLINICAL DATA:  Chest pain. EXAM: PORTABLE CHEST 1 VIEW COMPARISON:  July 28, 2022. FINDINGS: The heart size and mediastinal contours are within normal limits. Both lungs are clear. The visualized skeletal structures are unremarkable. IMPRESSION: No active disease. Electronically Signed   By: Lupita Raider M.D.   On: 08/01/2022 18:19   ECHOCARDIOGRAM COMPLETE  Result Date: 07/31/2022    ECHOCARDIOGRAM REPORT   Patient Name:   Cassandra Brooks Date of Exam: 07/31/2022 Medical Rec #:  [id number]        Height:       61.0 in Accession #:    [id number]       Weight:       166.5 lb Date of Birth:  May 02, 1940        BSA:          1.747 m Patient Age:    82 years         BP:           155/65 mmHg Patient Gender: F                HR:           90 bpm. Exam Location:  Inpatient Procedure: 2D Echo, Cardiac  Doppler, Color Doppler and Intracardiac            Opacification Agent Indications:    I21.9 Acute myocardial infarction  History:        Patient has prior history of Echocardiogram examinations, most  recent 03/22/2017. Acute MI, AAA; Risk Factors:Hypertension,                 Diabetes, Dyslipidemia and Non-Smoker.  Sonographer:    Dondra Prader RVT RCS Referring Phys: [id number] Lynda Rainwater A ACHARYA IMPRESSIONS  1. Left ventricular ejection fraction, by estimation, is 65 to 70%. The left ventricle has normal function. The left ventricle has no regional wall motion abnormalities. There is mild asymmetric left ventricular hypertrophy of the basal-septal segment. Left ventricular diastolic function could not be evaluated.  2. Right ventricular systolic function is hyperdynamic. The right ventricular size is normal. Tricuspid regurgitation signal is inadequate for assessing PA pressure.  3. Left atrial size was mildly dilated.  4. The mitral valve is grossly normal. No evidence of mitral valve regurgitation. No evidence of mitral stenosis. Moderate mitral annular calcification.  5. The aortic valve is tricuspid. Aortic valve regurgitation is not visualized. No aortic stenosis is present.  6. The inferior vena cava is normal in size with greater than 50% respiratory variability, suggesting right atrial pressure of 3 mmHg. Comparison(s): LVEF more vigorous from prior reporting. FINDINGS  Left Ventricle: Left ventricular ejection fraction, by estimation, is 65 to 70%. The left ventricle has normal function. The left ventricle has no regional wall motion abnormalities. Definity contrast agent was given IV to delineate the left ventricular  endocardial borders. The left ventricular internal cavity size was normal in size. There is mild asymmetric left ventricular hypertrophy of the basal-septal segment. Left ventricular diastolic function could not be evaluated due to mitral annular calcification (moderate or  greater). Left ventricular diastolic function could not be evaluated. Right Ventricle: The right ventricular size is normal. No increase in right ventricular wall thickness. Right ventricular systolic function is hyperdynamic. Tricuspid regurgitation signal is inadequate for assessing PA pressure. Left Atrium: Left atrial size was mildly dilated. Right Atrium: Right atrial size was normal in size. Pericardium: There is no evidence of pericardial effusion. Presence of epicardial fat layer. Mitral Valve: The mitral valve is grossly normal. Moderate mitral annular calcification. No evidence of mitral valve regurgitation. No evidence of mitral valve stenosis. Tricuspid Valve: The tricuspid valve is normal in structure. Tricuspid valve regurgitation is mild . No evidence of tricuspid stenosis. Aortic Valve: The aortic valve is tricuspid. Aortic valve regurgitation is not visualized. No aortic stenosis is present. Aortic valve mean gradient measures 4.0 mmHg. Aortic valve peak gradient measures 6.6 mmHg. Aortic valve area, by VTI measures 2.13 cm. Pulmonic Valve: The pulmonic valve was not well visualized. Pulmonic valve regurgitation is not visualized. No evidence of pulmonic stenosis. Aorta: The aortic root and ascending aorta are structurally normal, with no evidence of dilitation. Venous: The inferior vena cava is normal in size with greater than 50% respiratory variability, suggesting right atrial pressure of 3 mmHg. IAS/Shunts: No atrial level shunt detected by color flow Doppler.  LEFT VENTRICLE PLAX 2D LVIDd:         4.20 cm   Diastology LVIDs:         2.50 cm   LV e' medial:    4.25 cm/s LV PW:         1.30 cm   LV E/e' medial:  14.4 LV IVS:        0.90 cm   LV e' lateral:   5.81 cm/s LVOT diam:     2.00 cm   LV E/e' lateral: 10.6 LV SV:         48 LV SV Index:  27 LVOT Area:     3.14 cm  RIGHT VENTRICLE             IVC RV Basal diam:  2.80 cm     IVC diam: 1.30 cm RV Mid diam:    2.70 cm RV S prime:     20.20  cm/s TAPSE (M-mode): 1.9 cm LEFT ATRIUM             Index        RIGHT ATRIUM           Index LA diam:        3.90 cm 2.23 cm/m   RA Area:     11.00 cm LA Vol (A2C):   57.5 ml 32.91 ml/m  RA Volume:   23.20 ml  13.28 ml/m LA Vol (A4C):   61.3 ml 35.08 ml/m LA Biplane Vol: 60.2 ml 34.46 ml/m  AORTIC VALVE                    PULMONIC VALVE AV Area (Vmax):    2.22 cm     PV Vmax:          0.85 m/s AV Area (Vmean):   2.19 cm     PV Peak grad:     2.9 mmHg AV Area (VTI):     2.13 cm     PR End Diast Vel: 2.88 msec AV Vmax:           128.00 cm/s AV Vmean:          86.600 cm/s AV VTI:            0.224 m AV Peak Grad:      6.6 mmHg AV Mean Grad:      4.0 mmHg LVOT Vmax:         90.30 cm/s LVOT Vmean:        60.300 cm/s LVOT VTI:          0.152 m LVOT/AV VTI ratio: 0.68  AORTA Ao Root diam: 3.10 cm Ao Asc diam:  3.10 cm Ao Arch diam: 2.9 cm MITRAL VALVE                TRICUSPID VALVE MV Area (PHT): 2.93 cm     TR Peak grad:   15.8 mmHg MV Decel Time: 259 msec     TR Vmax:        199.00 cm/s MV E velocity: 61.30 cm/s MV A velocity: 103.00 cm/s  SHUNTS MV E/A ratio:  0.60         Systemic VTI:  0.15 m                             Systemic Diam: 2.00 cm Riley Lam MD Electronically signed by Riley Lam MD Signature Date/Time: 07/31/2022/1:25:38 PM    Final    CT HEAD WO CONTRAST ( )  Result Date: 07/31/2022 CLINICAL DATA:  82 year old female with headache, neurologic deficit. EXAM: CT HEAD WITHOUT CONTRAST TECHNIQUE: Contiguous axial images were obtained from the base of the skull through the vertex without intravenous contrast. RADIATION DOSE REDUCTION: This exam was performed according to the departmental dose-optimization program which includes automated exposure control, adjustment of the mA and/or kV according to patient size and/or use of iterative reconstruction technique. COMPARISON:  Brain MRI 10/08/2020.  Head CT 10/07/2020. FINDINGS: Brain: Stable cerebral volume. No midline shift,  ventriculomegaly, mass effect, evidence of mass lesion, intracranial  hemorrhage or evidence of cortically based acute infarction. A small but chronic appearing infarct in the right inferior cerebellum appears to be new. Patchy bilateral scattered cerebral white matter hypodensity, including some deep white matter capsule involvement, appears stable. Chronic basal ganglia heterogeneity is stable. Vascular: Calcified atherosclerosis at the skull base. No suspicious intracranial vascular hyperdensity. Skull: No acute osseous abnormality identified. Sinuses/Orbits: Visualized paranasal sinuses and mastoids are clear. Other: Visualized orbits and scalp soft tissues are within normal limits. IMPRESSION: 1. 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 2. No acute intracranial abnormality identified. And otherwise stable non contrast CT appearance of chronic small vessel disease. Electronically Signed   By: Odessa Fleming M.D.   On: 07/31/2022 09:52    Pending Labs Unresulted Labs (From admission, onward)     Start     Ordered   08/01/22 1752  Basic metabolic panel  Once,   STAT        08/01/22 1751            Vitals/Pain Today's Vitals   08/01/22 1740 08/01/22 1755  BP: (!) 151/74   Pulse: 96 87  Resp: 17 12  Temp: 97.6 F (36.4 C)   SpO2: 95% 97%    Isolation Precautions No active isolations  Medications Medications  nitroGLYCERIN 50 mg in dextrose 5 % 250 mL (0.2 mg/mL) infusion (has no administration in time range)    Mobility walks     Focused Assessments Cardiac Assessment Handoff:    Lab Results  Component Value Date   TROPONINI <0.03 03/22/2017   No results found for: "DDIMER" Does the Patient currently have chest pain? No   , Neuro Assessment Handoff:  Swallow screen pass? Yes          Neuro Assessment: Within Defined Limits Neuro Checks:      Has TPA been given? No If patient is a Neuro Trauma and patient is going to OR  before floor call report to 4N Charge nurse: [phone] or [phone]   R Recommendations: See Admitting Provider Note  Report given to:   Additional Notes: Na

## 2022-08-01 NOTE — Discharge Summary (Signed)
Discharge Summary    Patient ID: Cassandra Brooks MRN: [id number]; DOB: 19-Nov-1940  Admit date: 07/28/2022 Discharge date: 08/01/2022  PCP:  Hurshel Party, NP   [facility] Providers Cardiologist:  Garwin Brothers, MD      Discharge Diagnoses    Principal Problem:   STEMI involving right coronary artery Active Problems:   Hypertension   Mixed dyslipidemia   Diagnostic Studies/Procedures    Cath: 07/29/2022    2nd Diag lesion is 20% stenosed.   Mid LAD lesion is 40% stenosed.   Prox Cx to Mid Cx lesion is 20% stenosed.   1st Mrg lesion is 30% stenosed.   Prox RCA lesion is 20% stenosed.   RPDA lesion is 100% stenosed.   Balloon angioplasty was performed.   Post intervention, there is a 20% residual stenosis.   The left ventricular systolic function is normal.   LV end diastolic pressure is normal.   The left ventricular ejection fraction is 50-55% by visual estimate.   Inferior STEMI secondary to total occlusion of a small posterior descending artery branch of a dominant RCA.   Mild concomitant CAD with 40% mid LAD stenosis calcified segment; left circumflex 40% diffuse OM1 stenosis with 20% proximal circumflex stenosis, and mild irregularity of the dominant RCA with total occlusion of the PDA and large posterolateral vessel.   Preserved global LV contractility with EF estimate approximately 55%.  V EDP 15 mmHg.   With the patient still having chest pain, PCI was performed to the PDA vessel which turned out to be a small bifurcating vessel.  Due to vessel size, stenting was not done.  The 100% occlusion was reduced to 20% with resumption of TIMI-3 flow.     RECOMMENDATION: DAPT with aspirin/Brilinta.  Medical therapy for concomitant CAD.  Aggressive lipid-lowering therapy with target LDL less than 55 and optimal blood pressure control.  Patient will be maintained on low-dose IV nitroglycerin post procedure.  Since stenting was not performed, will resume heparin  following TR band removal. _____________   History of Present Illness     Cassandra Brooks is a 82 y.o. female with PMH of HTN, HLD, DM s/p AAA repair who presented with chest pain. She presented to the ED on 4/19 with c/o chest discomfort fo rthe past 2 days. She says she first started noticing pressure/pain in her chest the day prior to presentation. She just dismissed it as gas or indigestion. It went away during the night, but returned that morning and had been occurring intermittently all day today, up to 5/10 in intensity. No associated nausea, diaphoresis, or worsening SOB/DOE. She was found in ED to have ST elevation on EKG with HST >8000.  Hospital Course     Inferior STEMI -- underwent cardiac cath noted above with PCI of PDA, no DES placed given small vessel size.  100% occlusion reduced to 20% with TIMI-3 flow.  Mild concomitant CAD of 40% mid LAD stenosis, 40% diffuse OM1 and 20% proximal left circumflex to be treated medically. -- echo showed LVEF of 65-70% with no rWMA, hyperdynamic RV, mildly dilated LA -- continue ASA, Brilinta, statin  HTN -- still elevated but improved -- continue amlodipine 10mg  daily, lisinopril 20mg  daily, add metoprolol 25mg  BID   HLD -- LP (a) pending -- continue atorvastatin 80mg  daily  -- will need FLP/LFTs in 8 weeks   Patient seen by Dr. Clifton James and deemed stable for discharge home.   Did the patient have an acute coronary  syndrome (MI, NSTEMI, STEMI, etc) this admission?:  Yes                               AHA/ACC Clinical Performance & Quality Measures: Aspirin prescribed? - Yes ADP Receptor Inhibitor (Plavix/Clopidogrel, Brilinta/Ticagrelor or Effient/Prasugrel) prescribed (includes medically managed patients)? - Yes Beta Blocker prescribed? - Yes High Intensity Statin (Lipitor 40-80mg  or Crestor 20-40mg ) prescribed? - Yes EF assessed during THIS hospitalization? - Yes For EF <40%, was ACEI/ARB prescribed? - Not Applicable (EF >/=  40%) For EF <40%, Aldosterone Antagonist (Spironolactone or Eplerenone) prescribed? - Not Applicable (EF >/= 40%) Cardiac Rehab Phase II ordered (including medically managed patients)? - Yes     The patient will be scheduled for a TOC follow up appointment in 10-14 days.  A message has been sent to the [facility] and Scheduling Pool at the office where the patient should be seen for follow up.  _____________  Discharge Vitals Blood pressure (!) 154/69, pulse (!) 104, temperature 98.6 F (37 C), temperature source Oral, resp. rate 16, height 5\' 1"  (1.549 m), weight 75.5 kg, SpO2 96 %.  Filed Weights   07/28/22 2257 07/30/22 2345  Weight: 74.8 kg 75.5 kg    Labs & Radiologic Studies    CBC Recent Labs    07/31/22 0202 08/01/22 0236  WBC 5.8 5.7  HGB 12.4 13.3  HCT 37.4 40.6  MCV 104.2* 104.9*  PLT 191 211   Basic Metabolic Panel Recent Labs    16/10/96 0202 08/01/22 0236  NA 137 138  K 3.4* 3.8  CL 107 108  CO2 22 20*  GLUCOSE 141* 137*  BUN 24* 28*  CREATININE 0.97 0.97  CALCIUM 8.8* 9.0   Liver Function Tests No results for input(s): "AST", "ALT", "ALKPHOS", "BILITOT", "PROT", "ALBUMIN" in the last 72 hours. No results for input(s): "LIPASE", "AMYLASE" in the last 72 hours. High Sensitivity Troponin:   Recent Labs  Lab 07/28/22 2257 07/29/22 0044 07/29/22 0557  TROPONINIHS 8,901* 9,078* 15,064*    BNP Invalid input(s): "POCBNP" D-Dimer No results for input(s): "DDIMER" in the last 72 hours. Hemoglobin A1C No results for input(s): "HGBA1C" in the last 72 hours. Fasting Lipid Panel Recent Labs    08/01/22 0236  CHOL 116  HDL 38*  LDLCALC 48  TRIG 045  CHOLHDL 3.1   Thyroid Function Tests No results for input(s): "TSH", "T4TOTAL", "T3FREE", "THYROIDAB" in the last 72 hours.  Invalid input(s): "FREET3" _____________  ECHOCARDIOGRAM COMPLETE  Result Date: 07/31/2022    ECHOCARDIOGRAM REPORT   Patient Name:   Cassandra Brooks Date of Exam:  07/31/2022 Medical Rec #:  [id number]        Height:       61.0 in Accession #:    [id number]       Weight:       166.5 lb Date of Birth:  [birth year]        BSA:          1.747 m Patient Age:    82 years         BP:           155/65 mmHg Patient Gender: F                HR:           90 bpm. Exam Location:  Inpatient Procedure: 2D Echo, Cardiac Doppler, Color Doppler and Intracardiac  Opacification Agent Indications:    I21.9 Acute myocardial infarction  History:        Patient has prior history of Echocardiogram examinations, most                 recent 03/22/2017. Acute MI, AAA; Risk Factors:Hypertension,                 Diabetes, Dyslipidemia and Non-Smoker.  Sonographer:    Dondra Prader RVT RCS Referring Phys: [id number] Lynda Rainwater A ACHARYA IMPRESSIONS  1. Left ventricular ejection fraction, by estimation, is 65 to 70%. The left ventricle has normal function. The left ventricle has no regional wall motion abnormalities. There is mild asymmetric left ventricular hypertrophy of the basal-septal segment. Left ventricular diastolic function could not be evaluated.  2. Right ventricular systolic function is hyperdynamic. The right ventricular size is normal. Tricuspid regurgitation signal is inadequate for assessing PA pressure.  3. Left atrial size was mildly dilated.  4. The mitral valve is grossly normal. No evidence of mitral valve regurgitation. No evidence of mitral stenosis. Moderate mitral annular calcification.  5. The aortic valve is tricuspid. Aortic valve regurgitation is not visualized. No aortic stenosis is present.  6. The inferior vena cava is normal in size with greater than 50% respiratory variability, suggesting right atrial pressure of 3 mmHg. Comparison(s): LVEF more vigorous from prior reporting. FINDINGS  Left Ventricle: Left ventricular ejection fraction, by estimation, is 65 to 70%. The left ventricle has normal function. The left ventricle has no regional wall motion abnormalities.  Definity contrast agent was given IV to delineate the left ventricular  endocardial borders. The left ventricular internal cavity size was normal in size. There is mild asymmetric left ventricular hypertrophy of the basal-septal segment. Left ventricular diastolic function could not be evaluated due to mitral annular calcification (moderate or greater). Left ventricular diastolic function could not be evaluated. Right Ventricle: The right ventricular size is normal. No increase in right ventricular wall thickness. Right ventricular systolic function is hyperdynamic. Tricuspid regurgitation signal is inadequate for assessing PA pressure. Left Atrium: Left atrial size was mildly dilated. Right Atrium: Right atrial size was normal in size. Pericardium: There is no evidence of pericardial effusion. Presence of epicardial fat layer. Mitral Valve: The mitral valve is grossly normal. Moderate mitral annular calcification. No evidence of mitral valve regurgitation. No evidence of mitral valve stenosis. Tricuspid Valve: The tricuspid valve is normal in structure. Tricuspid valve regurgitation is mild . No evidence of tricuspid stenosis. Aortic Valve: The aortic valve is tricuspid. Aortic valve regurgitation is not visualized. No aortic stenosis is present. Aortic valve mean gradient measures 4.0 mmHg. Aortic valve peak gradient measures 6.6 mmHg. Aortic valve area, by VTI measures 2.13 cm. Pulmonic Valve: The pulmonic valve was not well visualized. Pulmonic valve regurgitation is not visualized. No evidence of pulmonic stenosis. Aorta: The aortic root and ascending aorta are structurally normal, with no evidence of dilitation. Venous: The inferior vena cava is normal in size with greater than 50% respiratory variability, suggesting right atrial pressure of 3 mmHg. IAS/Shunts: No atrial level shunt detected by color flow Doppler.  LEFT VENTRICLE PLAX 2D LVIDd:         4.20 cm   Diastology LVIDs:         2.50 cm   LV e'  medial:    4.25 cm/s LV PW:         1.30 cm   LV E/e' medial:  14.4 LV IVS:  0.90 cm   LV e' lateral:   5.81 cm/s LVOT diam:     2.00 cm   LV E/e' lateral: 10.6 LV SV:         48 LV SV Index:   27 LVOT Area:     3.14 cm  RIGHT VENTRICLE             IVC RV Basal diam:  2.80 cm     IVC diam: 1.30 cm RV Mid diam:    2.70 cm RV S prime:     20.20 cm/s TAPSE (M-mode): 1.9 cm LEFT ATRIUM             Index        RIGHT ATRIUM           Index LA diam:        3.90 cm 2.23 cm/m   RA Area:     11.00 cm LA Vol (A2C):   57.5 ml 32.91 ml/m  RA Volume:   23.20 ml  13.28 ml/m LA Vol (A4C):   61.3 ml 35.08 ml/m LA Biplane Vol: 60.2 ml 34.46 ml/m  AORTIC VALVE                    PULMONIC VALVE AV Area (Vmax):    2.22 cm     PV Vmax:          0.85 m/s AV Area (Vmean):   2.19 cm     PV Peak grad:     2.9 mmHg AV Area (VTI):     2.13 cm     PR End Diast Vel: 2.88 msec AV Vmax:           128.00 cm/s AV Vmean:          86.600 cm/s AV VTI:            0.224 m AV Peak Grad:      6.6 mmHg AV Mean Grad:      4.0 mmHg LVOT Vmax:         90.30 cm/s LVOT Vmean:        60.300 cm/s LVOT VTI:          0.152 m LVOT/AV VTI ratio: 0.68  AORTA Ao Root diam: 3.10 cm Ao Asc diam:  3.10 cm Ao Arch diam: 2.9 cm MITRAL VALVE                TRICUSPID VALVE MV Area (PHT): 2.93 cm     TR Peak grad:   15.8 mmHg MV Decel Time: 259 msec     TR Vmax:        199.00 cm/s MV E velocity: 61.30 cm/s MV A velocity: 103.00 cm/s  SHUNTS MV E/A ratio:  0.60         Systemic VTI:  0.15 m                             Systemic Diam: 2.00 cm Riley Lam MD Electronically signed by Riley Lam MD Signature Date/Time: 07/31/2022/1:25:38 PM    Final    CT HEAD WO CONTRAST ( )  Result Date: 07/31/2022 CLINICAL DATA:  82 year old female with headache, neurologic deficit. EXAM: CT HEAD WITHOUT CONTRAST TECHNIQUE: Contiguous axial images were obtained from the base of the skull through the vertex without intravenous contrast. RADIATION DOSE  REDUCTION: This exam was performed according to the departmental dose-optimization program which includes automated exposure control, adjustment  of the mA and/or kV according to patient size and/or use of iterative reconstruction technique. COMPARISON:  Brain MRI 10/08/2020.  Head CT 10/07/2020. FINDINGS: Brain: Stable cerebral volume. No midline shift, ventriculomegaly, mass effect, evidence of mass lesion, intracranial hemorrhage or evidence of cortically based acute infarction. A small but chronic appearing infarct in the right inferior cerebellum appears to be new. Patchy bilateral scattered cerebral white matter hypodensity, including some deep white matter capsule involvement, appears stable. Chronic basal ganglia heterogeneity is stable. Vascular: Calcified atherosclerosis at the skull base. No suspicious intracranial vascular hyperdensity. Skull: No acute osseous abnormality identified. Sinuses/Orbits: Visualized paranasal sinuses and mastoids are clear. Other: Visualized orbits and scalp soft tissues are within normal limits. IMPRESSION: 1. 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 2. No acute intracranial abnormality identified. And otherwise stable non contrast CT appearance of chronic small vessel disease. Electronically Signed   By: Odessa Fleming M.D.   On: 07/31/2022 09:52   CARDIAC CATHETERIZATION  Result Date: 07/29/2022   2nd Diag lesion is 20% stenosed.   Mid LAD lesion is 40% stenosed.   Prox Cx to Mid Cx lesion is 20% stenosed.   1st Mrg lesion is 30% stenosed.   Prox RCA lesion is 20% stenosed.   RPDA lesion is 100% stenosed.   Balloon angioplasty was performed.   Post intervention, there is a 20% residual stenosis.   The left ventricular systolic function is normal.   LV end diastolic pressure is normal.   The left ventricular ejection fraction is 50-55% by visual estimate. Inferior STEMI secondary to total occlusion of a small posterior descending  artery branch of a dominant RCA. Mild concomitant CAD with 40% mid LAD stenosis calcified segment; left circumflex 40% diffuse OM1 stenosis with 20% proximal circumflex stenosis, and mild irregularity of the dominant RCA with total occlusion of the PDA and large posterolateral vessel. Preserved global LV contractility with EF estimate approximately 55%.  V EDP 15 mmHg. With the patient still having chest pain, PCI was performed to the PDA vessel which turned out to be a small bifurcating vessel.  Due to vessel size, stenting was not done.  The 100% occlusion was reduced to 20% with resumption of TIMI-3 flow.  RECOMMENDATION: DAPT with aspirin/Brilinta.  Medical therapy for concomitant CAD.  Aggressive lipid-lowering therapy with target LDL less than 55 and optimal blood pressure control.  Patient will be maintained on low-dose IV nitroglycerin post procedure.  Since stenting was not performed, will resume heparin following TR band removal.   DG Chest 2 View  Result Date: 07/28/2022 CLINICAL DATA:  Chest pain and shortness of breath. EXAM: CHEST - 2 VIEW COMPARISON:  PA Lat 04/22/2021 FINDINGS: Heart size and central vasculature are normal. There is calcification of the transverse aorta. The mediastinum is normally outlined. The lungs again show mild increased interstitial markings but no focal pneumonic infiltrate. No pleural effusion is seen. Osteopenia. Comparison to the prior study reveals no significant interval change. IMPRESSION: No acute cardiopulmonary disease. Mild increased interstitial markings unchanged over the previous exam. Aortic atherosclerosis. Electronically Signed   By: Almira Bar M.D.   On: 07/28/2022 23:23   MR LUMBAR SPINE WO CONTRAST  Result Date: 07/20/2022 CLINICAL DATA:  Low back EXAM: MRI LUMBAR SPINE WITHOUT CONTRAST TECHNIQUE: Multiplanar, multisequence MR imaging of the lumbar spine was performed. No intravenous contrast was administered. COMPARISON:  MRI L Spine 02/11/21  FINDINGS: Segmentation: Standard. Last well-formed disc space is labeled L5-S1.  There is a small rudimentary disc at S1-S2. Alignment:  Trace retrolisthesis L2 on L3 L4 on L5 Vertebrae: Redemonstrated multilevel degenerative endplate changes (Modic changes) throughout the lumbar spine Conus medullaris and cauda equina: Conus extends to the L2 level. Conus and cauda equina appear normal. Paraspinal and other soft tissues: Negative. Disc levels: T12-L1: Unremarkable L1-L2: Mild bilateral facet degenerative change. No significant disc bulge. No spurring. No neural foraminal narrowing. L2-L3: L2 laminectomy. No spinal canal narrowing. Moderate bilateral facet degenerative change. Circumferential disc bulge, decreased from prior. No spinal canal narrowing. Moderate bilateral neural foraminal narrowing. L3-L4: L3 laminectomy. No spinal canal narrowing. Circumferential disc bulge, improved from prior. Moderate bilateral neural foraminal narrowing. Mild bilateral facet degenerative change. L4-L5: Severe bilateral facet degenerative change. Circumferential disc bulge. Mild to moderate spinal canal narrowing. Moderate bilateral neural foraminal narrowing. Findings are unchanged from prior exam. L5-S1: Mild bilateral facet degenerative change. Circumferential disc bulge. No spinal canal narrowing. Moderate right and moderate to severe left neural foraminal narrowing. Findings are unchanged from prior exam. IMPRESSION: 1. Postsurgical changes from L2 and L3 laminectomy (and likely discectomy) with improved/resolved spinal canal narrowing at L2-L3 and L3-L4. 2. Mild to moderate spinal canal narrowing at L4-L5, unchanged. 3. Unchanged moderate bilateral neural foraminal narrowing at L2-L3, L3-L4, and L4-L5. Unchanged moderate to severe left neural foraminal narrowing at L5-S1. Electronically Signed   By: Lorenza Cambridge M.D.   On: 07/20/2022 08:40   Disposition   Pt is being discharged home today in good condition.  Follow-up  Plans & Appointments     Follow-up Information     Revankar, Aundra Dubin, MD Follow up.   Specialty: Cardiology Why: Office will call you with a follow up appt Contact information: [address] [place] [address] [phone]                Discharge Instructions     AMB referral to cardiac rehabilitation   Complete by: As directed    [employer] [facility] [employer] in [place]   Diagnosis: PTCA   After initial evaluation and assessments completed: Virtual Based Care may be provided alone or in conjunction with Phase 2 Cardiac Rehab based on patient barriers.: Yes   Intensive Cardiac Rehabilitation (ICR) [facility] location only OR Traditional Cardiac Rehabilitation (TCR) *If criteria for ICR are not met will enroll in TCR [facility] only): Yes   Call MD for:  difficulty breathing, headache or visual disturbances   Complete by: As directed    Call MD for:  persistant dizziness or light-headedness   Complete by: As directed    Call MD for:  redness, tenderness, or signs of infection (pain, swelling, redness, odor or green/yellow discharge around incision site)   Complete by: As directed    Diet - low sodium heart healthy   Complete by: As directed    Discharge instructions   Complete by: As directed    Radial Site Care Refer to this sheet in the next few weeks. These instructions provide you with information on caring for yourself after your procedure. Your caregiver may also give you more specific instructions. Your treatment has been planned according to current medical practices, but problems sometimes occur. Call your caregiver if you have any problems or questions after your procedure. HOME CARE INSTRUCTIONS You may shower the day after the procedure. Remove the bandage (dressing) and gently wash the site with plain soap and water. Gently pat the site dry.  Do not apply powder or lotion to the site.  Do  not submerge the affected site in water for 3 to 5 days.  Inspect the site at least twice  daily.  Do not flex or bend the affected arm for 24 hours.  No lifting over 5 pounds (2.3 kg) for 5 days after your procedure.  Do not drive home if you are discharged the same day of the procedure. Have someone else drive you.  You may drive 24 hours after the procedure unless otherwise instructed by your caregiver.  What to expect: Any bruising will usually fade within 1 to 2 weeks.  Blood that collects in the tissue (hematoma) may be painful to the touch. It should usually decrease in size and tenderness within 1 to 2 weeks.  SEEK IMMEDIATE MEDICAL CARE IF: You have unusual pain at the radial site.  You have redness, warmth, swelling, or pain at the radial site.  You have drainage (other than a small amount of blood on the dressing).  You have chills.  You have a fever or persistent symptoms for more than 72 hours.  You have a fever and your symptoms suddenly get worse.  Your arm becomes pale, cool, tingly, or numb.  You have heavy bleeding from the site. Hold pressure on the site.   PLEASE DO NOT MISS ANY DOSES OF YOUR BRILINTA!!!!! Also keep a log of you blood pressures and bring back to your follow up appt. Please call the office with any questions.   Patients taking blood thinners should generally stay away from medicines like ibuprofen, Advil, Motrin, naproxen, and Aleve due to risk of stomach bleeding. You may take Tylenol as directed or talk to your primary doctor about alternatives.  Some studies suggest Prilosec/Omeprazole interacts with Plavix. We changed your Prilosec/Omeprazole to the equivalent dose of Protonix for less chance of interaction.  PLEASE ENSURE THAT YOU DO NOT RUN OUT OF YOUR BRILINTA. This medication is very important to remain on for at least one year. IF you have issues obtaining this medication due to cost please CALL the office 3-5 business days prior to running out in order to prevent missing doses of this medication.   Increase activity slowly   Complete  by: As directed         Discharge Medications   Allergies as of 08/01/2022   No Known Allergies      Medication List     STOP taking these medications    diclofenac 75 MG EC tablet Commonly known as: VOLTAREN   gabapentin 100 MG capsule Commonly known as: NEURONTIN   hydrochlorothiazide 12.5 MG tablet Commonly known as: HYDRODIURIL   HYDROcodone-acetaminophen 7.5-325 MG tablet Commonly known as: NORCO   meclizine 25 MG tablet Commonly known as: ANTIVERT   pravastatin 20 MG tablet Commonly known as: PRAVACHOL   traMADol-acetaminophen 37.5-325 MG tablet Commonly known as: Ultracet       TAKE these medications    acetaminophen 500 MG tablet Commonly known as: TYLENOL Take 1,000 mg by mouth every 6 (six) hours as needed for mild pain.   amLODipine 10 MG tablet Commonly known as: NORVASC Take 10 mg by mouth daily.   amoxicillin 500 MG tablet Commonly known as: AMOXIL take 4 pills one hour prior to dental work   amoxicillin 500 MG capsule Commonly known as: AMOXIL Take 4 pills one hour prior to dental work   aspirin EC 81 MG tablet Take 81 mg by mouth daily. Swallow whole.   atorvastatin 80 MG tablet Commonly known as: LIPITOR Take 1 tablet (  80 mg total) by mouth daily. Start taking on: August 02, 2022   diclofenac Sodium 1 % Gel Commonly known as: Voltaren Apply 2 g topically 4 (four) times daily.   docusate sodium 100 MG capsule Commonly known as: COLACE Take 1 capsule (100 mg total) by mouth 2 (two) times daily.   dorzolamide-timolol 2-0.5 % ophthalmic solution Commonly known as: COSOPT Place 1 drop into both eyes 2 (two) times daily.   fluticasone 50 MCG/ACT nasal spray Commonly known as: FLONASE Place 2 sprays into both nostrils 2 (two) times daily.   lisinopril 20 MG tablet Commonly known as: ZESTRIL Take 1 tablet (20 mg total) by mouth daily. Start taking on: August 02, 2022   Lumigan 0.01 % Soln Generic drug: bimatoprost Place  1 drop into both eyes at bedtime.   methocarbamol 500 MG tablet Commonly known as: ROBAXIN Take 1 tablet (500 mg total) by mouth every 6 (six) hours as needed for muscle spasms. What changed: Another medication with the same name was removed. Continue taking this medication, and follow the directions you see here.   metoprolol tartrate 25 MG tablet Commonly known as: LOPRESSOR Take 1 tablet (25 mg total) by mouth 2 (two) times daily.   nabumetone 750 MG tablet Commonly known as: RELAFEN Take 750 mg by mouth 2 (two) times daily.   nitroGLYCERIN 0.4 MG SL tablet Commonly known as: NITROSTAT Place 1 tablet (0.4 mg total) under the tongue every 5 (five) minutes as needed.   omeprazole 20 MG capsule Commonly known as: PRILOSEC Take 20 mg by mouth daily.   ticagrelor 90 MG Tabs tablet Commonly known as: BRILINTA Take 1 tablet (90 mg total) by mouth 2 (two) times daily.   venlafaxine XR 150 MG 24 hr capsule Commonly known as: EFFEXOR-XR Take 1 capsule (150 mg total) by mouth daily with breakfast.         Outstanding Labs/Studies   FLP/LFTs in 8 weeks   Duration of Discharge Encounter   Greater than 30 minutes including physician time.  Signed, Laverda Page, NP 08/01/2022, 10:06 AM  I have personally seen and examined this patient. I agree with the assessment and plan as outlined above.  Pt doing well today. No chest pain or dyspnea. No events overnight. LV function normal by echo.  Continue ASA, Brilinta and statin. Beta blocker added.  Will d/c home today.   Verne Carrow, MD, [facility] 08/01/2022 10:06 AM

## 2022-08-01 NOTE — ED Provider Triage Note (Signed)
Emergency Medicine Provider Triage Evaluation Note  Cassandra Brooks , a 82 y.o. female  was evaluated in triage.  Pt complains of chest pain.  Discharged from the hospital earlier today for STEMI.  States she started having chest pain shortly after she returned home.  Located in the center of her chest, feels dull and is nonradiating.  Denies associated shortness of breath.  States she is having some indigestion with her chest pain that makes it worse.  Review of Systems  Positive: See above Negative: See above  Physical Exam  There were no vitals taken for this visit. Gen:   Awake, no distress   Resp:  Normal effort  MSK:   Moves extremities without difficulty  Other:    Medical Decision Making  Medically screening exam initiated at 5:39 PM.  Appropriate orders placed.  Cassandra Brooks was informed that the remainder of the evaluation will be completed by another provider, this initial triage assessment does not replace that evaluation, and the importance of remaining in the ED until their evaluation is complete.  Work up started   Gareth Eagle, PA-C 08/01/22 1745

## 2022-08-01 NOTE — ED Triage Notes (Signed)
Pt DC from 6E this morning, went home & began having centralized "dull" CP & feeling of "indigestion." Admitted 4/19- 4/22 for STEMI, cath, similar symptoms

## 2022-08-01 NOTE — Progress Notes (Signed)
CARDIAC REHAB PHASE I   PRE:  Rate/Rhythm: 84 SR  BP:  Sitting: 154/69      SaO2: 95 RA  MODE:  Ambulation: 75 ft   POST:  Rate/Rhythm: 100 SR  BP:  Sitting: 151/74      SaO2: 97 RA  Pt ambulated in hall with hands on for steady assistance. Tolerated fairly well with c/o of fatigue and feeling unbalanced. Denied SOB or CP. Returned to chair with call bell and Bedside table in reach. Plan for home later today.   [id number]  Woodroe Chen, RN BSN 08/01/2022 11:04 AM

## 2022-08-01 NOTE — ED Provider Notes (Signed)
[place] EMERGENCY DEPARTMENT AT [facility] Provider Note   CSN: [id number] Arrival date & time: 08/01/22  1723     History  Chief Complaint  Patient presents with   Chest Pain    Ranee Peasley is a 82 y.o. female.   Chest Pain    82 year old female with medical history significant for HTN, H LD, DM's, history of AAA status post repair, recent inferior STEMI with total occlusion of the PDA and large posterior lateral vessel, subsequently status post balloon angioplasty with TIMI-3 flow, treated medically without stenting with DAPT with aspirin and Brilinta who presents with chest pain.  The patient was just discharged from the cardiology service this morning.  She states that she had not yet picked up her Brilinta prescription but did receive a dose of Brilinta in the hospital today and a daily aspirin.  She states that today she developed a burning chest discomfort almost like an indigestion however given her recent STEMI she decided to present back to the emergency department for further evaluation.  On arrival, the patient had EKG changes inferiorly and a code STEMI was called.  Home Medications Prior to Admission medications   Medication Sig Start Date End Date Taking? Authorizing Provider  acetaminophen (TYLENOL) 500 MG tablet Take 1,000 mg by mouth every 6 (six) hours as needed for mild pain.    [provider]  amLODipine (NORVASC) 10 MG tablet Take 10 mg by mouth daily.    [provider]  amoxicillin (AMOXIL) 500 MG capsule Take 4 pills one hour prior to dental work Patient not taking: Reported on 07/29/2022 03/18/22   Cristie Hem, PA-C  amoxicillin (AMOXIL) 500 MG tablet take 4 pills one hour prior to dental work 05/17/21   Cristie Hem, PA-C  aspirin EC 81 MG tablet Take 81 mg by mouth daily. Swallow whole.    [provider]  atorvastatin (LIPITOR) 80 MG tablet Take 1 tablet (80 mg total) by mouth daily. 08/02/22   Arty Baumgartner, NP  diclofenac Sodium (VOLTAREN) 1 % GEL Apply 2 g topically 4 (four) times daily. 10/19/21   Cristie Hem, PA-C  docusate sodium (COLACE) 100 MG capsule Take 1 capsule (100 mg total) by mouth 2 (two) times daily. Patient not taking: Reported on 07/29/2022 04/27/21   Kerrin Champagne, MD  dorzolamide-timolol (COSOPT) 22.3-6.8 MG/ML ophthalmic solution Place 1 drop into both eyes 2 (two) times daily.    [provider]  fluticasone (FLONASE) 50 MCG/ACT nasal spray Place 2 sprays into both nostrils 2 (two) times daily. 11/08/19   [provider]  lisinopril (ZESTRIL) 20 MG tablet Take 1 tablet (20 mg total) by mouth daily. 08/02/22   Arty Baumgartner, NP  LUMIGAN 0.01 % SOLN Place 1 drop into both eyes at bedtime. 12/12/19   [provider]  methocarbamol (ROBAXIN) 500 MG tablet Take 1 tablet (500 mg total) by mouth every 6 (six) hours as needed for muscle spasms. Patient not taking: Reported on 07/29/2022 04/27/21   Kerrin Champagne, MD  metoprolol tartrate (LOPRESSOR) 25 MG tablet Take 1 tablet (25 mg total) by mouth 2 (two) times daily. 08/01/22   Arty Baumgartner, NP  nabumetone (RELAFEN) 750 MG tablet Take 750 mg by mouth 2 (two) times daily. 02/21/17   [provider]  nitroGLYCERIN (NITROSTAT) 0.4 MG SL tablet Place 1 tablet (0.4 mg total) under the tongue every 5 (five) minutes as needed. 08/01/22   Su Hilt,  Ernest Haber, NP  omeprazole (PRILOSEC) 20 MG capsule Take 20 mg by mouth daily.    [provider]  ticagrelor (BRILINTA) 90 MG TABS tablet Take 1 tablet (90 mg total) by mouth 2 (two) times daily. 08/01/22   Arty Baumgartner, NP  venlafaxine XR (EFFEXOR-XR) 150 MG 24 hr capsule Take 1 capsule (150 mg total) by mouth daily with breakfast. 03/28/17   Oralia Manis, DO      Allergies    Patient has no known allergies.    Review of Systems   Review of Systems  Unable to perform ROS: Acuity of condition  Cardiovascular:  Positive for  chest pain.    Physical Exam Updated Vital Signs BP (!) 151/74   Pulse 87   Temp 97.6 F (36.4 C)   Resp 12   SpO2 97%  Physical Exam Vitals and nursing note reviewed.  Constitutional:      General: She is not in acute distress.    Appearance: She is well-developed.  HENT:     Head: Normocephalic and atraumatic.  Eyes:     Conjunctiva/sclera: Conjunctivae normal.  Cardiovascular:     Rate and Rhythm: Normal rate and regular rhythm.     Heart sounds: No murmur heard. Pulmonary:     Effort: Pulmonary effort is normal. No respiratory distress.     Breath sounds: Normal breath sounds.  Abdominal:     Palpations: Abdomen is soft.     Tenderness: There is no abdominal tenderness.  Musculoskeletal:        General: No swelling.     Cervical back: Neck supple.  Skin:    General: Skin is warm and dry.     Capillary Refill: Capillary refill takes less than 2 seconds.  Neurological:     Mental Status: She is alert.  Psychiatric:        Mood and Affect: Mood normal.     ED Results / Procedures / Treatments   Labs (all labs ordered are listed, but only abnormal results are displayed) Labs Reviewed  CBC WITH DIFFERENTIAL/PLATELET - Abnormal; Notable for the following components:      Result Value   MCV 103.5 (*)    MCH 34.8 (*)    All other components within normal limits  BASIC METABOLIC PANEL - Abnormal; Notable for the following components:   CO2 20 (*)    Glucose, Bld 156 (*)    BUN 34 (*)    GFR, Estimated 60 (*)    All other components within normal limits  TROPONIN I (HIGH SENSITIVITY) - Abnormal; Notable for the following components:   Troponin I (High Sensitivity) 1,082 (*)    All other components within normal limits  LIPOPROTEIN A (LPA)  MAGNESIUM  BASIC METABOLIC PANEL  CBC  HEPARIN LEVEL (UNFRACTIONATED)  HEMOGLOBIN A1C  TROPONIN I (HIGH SENSITIVITY)    EKG EKG Interpretation  Date/Time:  Monday August 01 2022 18:07:19 EDT Ventricular Rate:   84 PR Interval:  151 QRS Duration: 67 QT Interval:  339 QTC Calculation: 401 R Axis:   32 Text Interpretation: Sinus rhythm Inferior infarct, acute (RCA) Probable RV involvement, suggest recording right precordial leads >>> Acute MI <<< ** ** ACUTE MI / STEMI ** ** Discussed with Dr. Allyson Sabal, interventionalist Confirmed by Ernie Avena ([id number]) on 08/01/2022 7:04:45 PM  Radiology DG Chest Portable 1 View  Result Date: 08/01/2022 CLINICAL DATA:  Chest pain. EXAM: PORTABLE CHEST 1 VIEW COMPARISON:  July 28, 2022. FINDINGS: The heart size and  mediastinal contours are within normal limits. Both lungs are clear. The visualized skeletal structures are unremarkable. IMPRESSION: No active disease. Electronically Signed   By: Lupita Raider M.D.   On: 08/01/2022 18:19   ECHOCARDIOGRAM COMPLETE  Result Date: 07/31/2022    ECHOCARDIOGRAM REPORT   Patient Name:   GWYNETH FERNANDEZ Date of Exam: 07/31/2022 Medical Rec #:  [id number]        Height:       61.0 in Accession #:    [id number]       Weight:       166.5 lb Date of Birth:  Sep 06, 1940        BSA:          1.747 m Patient Age:    82 years         BP:           155/65 mmHg Patient Gender: F                HR:           90 bpm. Exam Location:  Inpatient Procedure: 2D Echo, Cardiac Doppler, Color Doppler and Intracardiac            Opacification Agent Indications:    I21.9 Acute myocardial infarction  History:        Patient has prior history of Echocardiogram examinations, most                 recent 03/22/2017. Acute MI, AAA; Risk Factors:Hypertension,                 Diabetes, Dyslipidemia and Non-Smoker.  Sonographer:    Dondra Prader RVT RCS Referring Phys: [id number] Lynda Rainwater A ACHARYA IMPRESSIONS  1. Left ventricular ejection fraction, by estimation, is 65 to 70%. The left ventricle has normal function. The left ventricle has no regional wall motion abnormalities. There is mild asymmetric left ventricular hypertrophy of the basal-septal segment. Left ventricular  diastolic function could not be evaluated.  2. Right ventricular systolic function is hyperdynamic. The right ventricular size is normal. Tricuspid regurgitation signal is inadequate for assessing PA pressure.  3. Left atrial size was mildly dilated.  4. The mitral valve is grossly normal. No evidence of mitral valve regurgitation. No evidence of mitral stenosis. Moderate mitral annular calcification.  5. The aortic valve is tricuspid. Aortic valve regurgitation is not visualized. No aortic stenosis is present.  6. The inferior vena cava is normal in size with greater than 50% respiratory variability, suggesting right atrial pressure of 3 mmHg. Comparison(s): LVEF more vigorous from prior reporting. FINDINGS  Left Ventricle: Left ventricular ejection fraction, by estimation, is 65 to 70%. The left ventricle has normal function. The left ventricle has no regional wall motion abnormalities. Definity contrast agent was given IV to delineate the left ventricular  endocardial borders. The left ventricular internal cavity size was normal in size. There is mild asymmetric left ventricular hypertrophy of the basal-septal segment. Left ventricular diastolic function could not be evaluated due to mitral annular calcification (moderate or greater). Left ventricular diastolic function could not be evaluated. Right Ventricle: The right ventricular size is normal. No increase in right ventricular wall thickness. Right ventricular systolic function is hyperdynamic. Tricuspid regurgitation signal is inadequate for assessing PA pressure. Left Atrium: Left atrial size was mildly dilated. Right Atrium: Right atrial size was normal in size. Pericardium: There is no evidence of pericardial effusion. Presence of epicardial fat layer. Mitral Valve: The mitral valve is grossly normal.  Moderate mitral annular calcification. No evidence of mitral valve regurgitation. No evidence of mitral valve stenosis. Tricuspid Valve: The tricuspid valve  is normal in structure. Tricuspid valve regurgitation is mild . No evidence of tricuspid stenosis. Aortic Valve: The aortic valve is tricuspid. Aortic valve regurgitation is not visualized. No aortic stenosis is present. Aortic valve mean gradient measures 4.0 mmHg. Aortic valve peak gradient measures 6.6 mmHg. Aortic valve area, by VTI measures 2.13 cm. Pulmonic Valve: The pulmonic valve was not well visualized. Pulmonic valve regurgitation is not visualized. No evidence of pulmonic stenosis. Aorta: The aortic root and ascending aorta are structurally normal, with no evidence of dilitation. Venous: The inferior vena cava is normal in size with greater than 50% respiratory variability, suggesting right atrial pressure of 3 mmHg. IAS/Shunts: No atrial level shunt detected by color flow Doppler.  LEFT VENTRICLE PLAX 2D LVIDd:         4.20 cm   Diastology LVIDs:         2.50 cm   LV e' medial:    4.25 cm/s LV PW:         1.30 cm   LV E/e' medial:  14.4 LV IVS:        0.90 cm   LV e' lateral:   5.81 cm/s LVOT diam:     2.00 cm   LV E/e' lateral: 10.6 LV SV:         48 LV SV Index:   27 LVOT Area:     3.14 cm  RIGHT VENTRICLE             IVC RV Basal diam:  2.80 cm     IVC diam: 1.30 cm RV Mid diam:    2.70 cm RV S prime:     20.20 cm/s TAPSE (M-mode): 1.9 cm LEFT ATRIUM             Index        RIGHT ATRIUM           Index LA diam:        3.90 cm 2.23 cm/m   RA Area:     11.00 cm LA Vol (A2C):   57.5 ml 32.91 ml/m  RA Volume:   23.20 ml  13.28 ml/m LA Vol (A4C):   61.3 ml 35.08 ml/m LA Biplane Vol: 60.2 ml 34.46 ml/m  AORTIC VALVE                    PULMONIC VALVE AV Area (Vmax):    2.22 cm     PV Vmax:          0.85 m/s AV Area (Vmean):   2.19 cm     PV Peak grad:     2.9 mmHg AV Area (VTI):     2.13 cm     PR End Diast Vel: 2.88 msec AV Vmax:           128.00 cm/s AV Vmean:          86.600 cm/s AV VTI:            0.224 m AV Peak Grad:      6.6 mmHg AV Mean Grad:      4.0 mmHg LVOT Vmax:         90.30 cm/s  LVOT Vmean:        60.300 cm/s LVOT VTI:          0.152 m LVOT/AV VTI ratio: 0.68  AORTA Ao  Root diam: 3.10 cm Ao Asc diam:  3.10 cm Ao Arch diam: 2.9 cm MITRAL VALVE                TRICUSPID VALVE MV Area (PHT): 2.93 cm     TR Peak grad:   15.8 mmHg MV Decel Time: 259 msec     TR Vmax:        199.00 cm/s MV E velocity: 61.30 cm/s MV A velocity: 103.00 cm/s  SHUNTS MV E/A ratio:  0.60         Systemic VTI:  0.15 m                             Systemic Diam: 2.00 cm Riley Lam MD Electronically signed by Riley Lam MD Signature Date/Time: 07/31/2022/1:25:38 PM    Final    CT HEAD WO CONTRAST ( )  Result Date: 07/31/2022 CLINICAL DATA:  82 year old female with headache, neurologic deficit. EXAM: CT HEAD WITHOUT CONTRAST TECHNIQUE: Contiguous axial images were obtained from the base of the skull through the vertex without intravenous contrast. RADIATION DOSE REDUCTION: This exam was performed according to the departmental dose-optimization program which includes automated exposure control, adjustment of the mA and/or kV according to patient size and/or use of iterative reconstruction technique. COMPARISON:  Brain MRI 10/08/2020.  Head CT 10/07/2020. FINDINGS: Brain: Stable cerebral volume. No midline shift, ventriculomegaly, mass effect, evidence of mass lesion, intracranial hemorrhage or evidence of cortically based acute infarction. A small but chronic appearing infarct in the right inferior cerebellum appears to be new. Patchy bilateral scattered cerebral white matter hypodensity, including some deep white matter capsule involvement, appears stable. Chronic basal ganglia heterogeneity is stable. Vascular: Calcified atherosclerosis at the skull base. No suspicious intracranial vascular hyperdensity. Skull: No acute osseous abnormality identified. Sinuses/Orbits: Visualized paranasal sinuses and mastoids are clear. Other: Visualized orbits and scalp soft tissues are within normal limits.  IMPRESSION: 1. 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 2. No acute intracranial abnormality identified. And otherwise stable non contrast CT appearance of chronic small vessel disease. Electronically Signed   By: Odessa Fleming M.D.   On: 07/31/2022 09:52    Procedures .Critical Care  Performed by: Ernie Avena, MD Authorized by: Ernie Avena, MD   Critical care provider statement:    Critical care time (minutes):  30   Critical care was necessary to treat or prevent imminent or life-threatening deterioration of the following conditions:  Cardiac failure   Critical care was time spent personally by me on the following activities:  Development of treatment plan with patient or surrogate, discussions with consultants, evaluation of patient's response to treatment, examination of patient, ordering and review of laboratory studies, ordering and review of radiographic studies, ordering and performing treatments and interventions, pulse oximetry, re-evaluation of patient's condition and review of old charts   Care discussed with: admitting provider       Medications Ordered in ED Medications  atorvastatin (LIPITOR) tablet 80 mg (has no administration in time range)  lisinopril (ZESTRIL) tablet 20 mg (has no administration in time range)  metoprolol tartrate (LOPRESSOR) tablet 25 mg (has no administration in time range)  ticagrelor (BRILINTA) tablet 90 mg (has no administration in time range)  aspirin EC tablet 81 mg (has no administration in time range)  nitroGLYCERIN (NITROSTAT) SL tablet 0.4 mg (has no administration in time range)  acetaminophen (TYLENOL) tablet 650 mg (has no administration  in time range)  ondansetron (ZOFRAN) injection 4 mg (has no administration in time range)  0.9 %  sodium chloride infusion (has no administration in time range)  nitroGLYCERIN 50 mg in dextrose 5 % 250 mL (0.2 mg/mL) infusion (5 mcg/min Intravenous New Bag/Given  08/01/22 1951)  pantoprazole (PROTONIX) EC tablet 40 mg (40 mg Oral Given 08/01/22 2012)  morphine (PF) 2 MG/ML injection 2 mg (has no administration in time range)  heparin ADULT infusion 100 units/mL (25000 units/293mL) (750 Units/hr Intravenous New Bag/Given 08/01/22 2001)  amLODipine (NORVASC) tablet 10 mg (has no administration in time range)  aspirin chewable tablet 324 mg (324 mg Oral Given 08/01/22 1955)    Or  aspirin suppository 300 mg ( Rectal See Alternative 08/01/22 1955)  heparin bolus via infusion 3,800 Units (3,800 Units Intravenous Bolus from Bag 08/01/22 2012)    ED Course/ Medical Decision Making/ A&P Clinical Course as of 08/01/22 2020  Mon Aug 01, 2022  2019 Troponin I (High Sensitivity)(!!): 1,082 [JL]    Clinical Course User Index [JL] Ernie Avena, MD                             Medical Decision Making Amount and/or Complexity of Data Reviewed Labs: ordered. Decision-making details documented in ED Course. Radiology: ordered.  Risk Decision regarding hospitalization.    82 year old female with medical history significant for HTN, H LD, DM's, history of AAA status post repair, recent inferior STEMI with total occlusion of the PDA and large posterior lateral vessel, subsequently status post balloon angioplasty with TIMI-3 flow, treated medically without stenting with DAPT with aspirin and Brilinta who presents with chest pain.  The patient was just discharged from the cardiology service this morning.  She states that she had not yet picked up her Brilinta prescription but did receive a dose of Brilinta in the hospital today and a daily aspirin.  She states that today she developed a burning chest discomfort almost like an indigestion however given her recent STEMI she decided to present back to the emergency department for further evaluation.  On arrival, the patient had EKG changes inferiorly and a code STEMI was called.  On arrival, the patient was afebrile, not  tachycardic or tachypneic, BP 151/74, saturating 95% on room air. Initial EKG concerning for inferior STEMI with ST segment elevations in leads II and aVF.  I discussed the patient's EKG findings and presentation with Dr. Gery Pray, on-call interventional cardiology.  Given the patient's recent cardiac catheterization and medical management, Cath Lab activation was deferred.  The code STEMI was canceled.  Cardiology on-call was consulted for further recommendations, spoke with Dr. Bjorn Pippin who recommended admission to the ICU for close monitoring for continued medical management.  The patient was started on a nitroglycerin infusion due to return of her chest discomfort, was also started on heparin in the emergency department.  A chest x-ray was performed which revealed no active disease.  A repeat EKG confirmed a STEMI.  Laboratory evaluations unification for CBC without a leukocytosis or anemia, BMP with hyperglycemia 156, no AKI with a creatinine of 0.95, otherwise unremarkable electrolytes, initial troponin 1082 which is elevated but downtrending from previous measurements 3 days ago.  The patient was started on a nitroglycerin gtt. and heparin gtt. and was subsequently admitted to the ICU for close monitoring under the care of of Dr. Bjorn Pippin of cardiology.   Final Clinical Impression(s) / ED Diagnoses Final diagnoses:  ACS (  acute coronary syndrome)    Rx / DC Orders ED Discharge Orders     None         Ernie Avena, MD 08/01/22 2020

## 2022-08-02 ENCOUNTER — Inpatient Hospital Stay (HOSPITAL_COMMUNITY): Payer: Medicare Other

## 2022-08-02 DIAGNOSIS — R079 Chest pain, unspecified: Secondary | ICD-10-CM | POA: Diagnosis not present

## 2022-08-02 DIAGNOSIS — I25118 Atherosclerotic heart disease of native coronary artery with other forms of angina pectoris: Secondary | ICD-10-CM

## 2022-08-02 DIAGNOSIS — I213 ST elevation (STEMI) myocardial infarction of unspecified site: Secondary | ICD-10-CM

## 2022-08-02 LAB — CBC
HCT: 36.9 % (ref 36.0–46.0)
Hemoglobin: 12.8 g/dL (ref 12.0–15.0)
MCH: 35.8 pg — ABNORMAL HIGH (ref 26.0–34.0)
MCHC: 34.7 g/dL (ref 30.0–36.0)
MCV: 103.1 fL — ABNORMAL HIGH (ref 80.0–100.0)
Platelets: 213 10*3/uL (ref 150–400)
RBC: 3.58 MIL/uL — ABNORMAL LOW (ref 3.87–5.11)
RDW: 12.6 % (ref 11.5–15.5)
WBC: 6.9 10*3/uL (ref 4.0–10.5)
nRBC: 0 % (ref 0.0–0.2)

## 2022-08-02 LAB — TROPONIN I (HIGH SENSITIVITY): Troponin I (High Sensitivity): 961 ng/L (ref <18)

## 2022-08-02 LAB — BASIC METABOLIC PANEL
Anion gap: 9 (ref 5–15)
BUN: 31 mg/dL — ABNORMAL HIGH (ref 8–23)
CO2: 22 mmol/L (ref 22–32)
Calcium: 9.1 mg/dL (ref 8.9–10.3)
Chloride: 108 mmol/L (ref 98–111)
Creatinine, Ser: 0.95 mg/dL (ref 0.44–1.00)
GFR, Estimated: 60 mL/min — ABNORMAL LOW (ref >60)
Glucose, Bld: 136 mg/dL — ABNORMAL HIGH (ref 70–99)
Potassium: 3.8 mmol/L (ref 3.5–5.1)
Sodium: 139 mmol/L (ref 135–145)

## 2022-08-02 LAB — ECHOCARDIOGRAM LIMITED
S' Lateral: 2.6 cm
Weight: 2638.47 oz

## 2022-08-02 LAB — MAGNESIUM: Magnesium: 2.1 mg/dL (ref 1.7–2.4)

## 2022-08-02 LAB — LIPOPROTEIN A (LPA): Lipoprotein (a): 17.8 nmol/L (ref <75.0)

## 2022-08-02 LAB — HEPARIN LEVEL (UNFRACTIONATED)
Heparin Unfractionated: 0.25 IU/mL — ABNORMAL LOW (ref 0.30–0.70)
Heparin Unfractionated: <0.1 IU/mL — ABNORMAL LOW (ref 0.30–0.70)

## 2022-08-02 LAB — MRSA NEXT GEN BY PCR, NASAL: MRSA by PCR Next Gen: NOT DETECTED

## 2022-08-02 LAB — HEMOGLOBIN A1C
Hgb A1c MFr Bld: 5.6 % (ref 4.8–5.6)
Mean Plasma Glucose: 114.02 mg/dL

## 2022-08-02 MED ORDER — LATANOPROST 0.005 % OP SOLN
1.0000 [drp] | Freq: Every day | OPHTHALMIC | Status: DC
  Administered 2022-08-02: 1 [drp] via OPHTHALMIC
  Filled 2022-08-02: qty 2.5

## 2022-08-02 MED ORDER — DORZOLAMIDE HCL-TIMOLOL MAL 2-0.5 % OP SOLN
1.0000 [drp] | Freq: Two times a day (BID) | OPHTHALMIC | Status: DC
  Administered 2022-08-02 – 2022-08-03 (×2): 1 [drp] via OPHTHALMIC
  Filled 2022-08-02: qty 10

## 2022-08-02 NOTE — Progress Notes (Signed)
Rounding Note    Patient Name: Cassandra Brooks Date of Encounter: 08/02/2022  [facility] Cardiologist: Garwin Brothers, MD   Subjective   Mild chest pain this am. Feels like heartburn to the patient  Inpatient Medications    Scheduled Meds:  amLODipine  10 mg Oral Daily   aspirin EC  81 mg Oral Daily   atorvastatin  80 mg Oral Daily   Chlorhexidine Gluconate Cloth  6 each Topical Q0600   lisinopril  20 mg Oral Daily   metoprolol tartrate  25 mg Oral BID   pantoprazole  40 mg Oral Daily   ticagrelor  90 mg Oral BID   Continuous Infusions:  sodium chloride     heparin 850 Units/hr (08/02/22 0700)   nitroGLYCERIN 20 mcg/min (08/02/22 0600)   PRN Meds: acetaminophen, morphine injection, nitroGLYCERIN, ondansetron (ZOFRAN) IV, mouth rinse   Vital Signs    Vitals:   08/02/22 0615 08/02/22 0630 08/02/22 0645 08/02/22 0700  BP: (!) 124/59 126/65 (!) 127/59 135/65  Pulse: 90 91 84 89  Resp: 18 12 20 16   Temp:      TempSrc:      SpO2: 92% 96% 95% 96%  Weight:        Intake/Output Summary (Last 24 hours) at 08/02/2022 0805 Last data filed at 08/02/2022 0700 Gross per 24 hour  Intake 177.95 ml  Output --  Net 177.95 ml      08/02/2022    5:00 AM 08/01/2022    9:45 PM 07/30/2022   11:45 PM  Last 3 Weights  Weight (lbs) 164 lb 14.5 oz 165 lb 2 oz 166 lb 8 oz  Weight (kg) 74.8 kg 74.9 kg 75.524 kg      Telemetry    Sinus - Personally Reviewed  ECG    Sinus, residual inferior ST elevation, 1mm unchanged from post cath EKG - Personally Reviewed  Physical Exam   GEN: No acute distress.   Neck: No JVD Cardiac: RRR, no murmurs, rubs, or gallops.  Respiratory: Clear to auscultation bilaterally. GI: Soft, nontender, non-distended  MS: No edema; No deformity. Neuro:  Nonfocal  Psych: Normal affect   Labs    High Sensitivity Troponin:   Recent Labs  Lab 07/28/22 2257 07/29/22 0044 07/29/22 0557 08/01/22 1756 08/02/22 0026  TROPONINIHS  8,901* 9,078* 15,064* 1,082* 961*     Chemistry Recent Labs  Lab 08/01/22 0236 08/01/22 1756 08/02/22 0026  NA 138 138 139  K 3.8 3.8 3.8  CL 108 103 108  CO2 20* 20* 22  GLUCOSE 137* 156* 136*  BUN 28* 34* 31*  CREATININE 0.97 0.95 0.95  CALCIUM 9.0 9.9 9.1  MG  --   --  2.1  GFRNONAA 58* 60* 60*  ANIONGAP 10 15 9     Lipids  Recent Labs  Lab 08/01/22 0236  CHOL 116  TRIG 148  HDL 38*  LDLCALC 48  CHOLHDL 3.1    Hematology Recent Labs  Lab 08/01/22 0236 08/01/22 1756 08/02/22 0026  WBC 5.7 6.1 6.9  RBC 3.87 4.25 3.58*  HGB 13.3 14.8 12.8  HCT 40.6 44.0 36.9  MCV 104.9* 103.5* 103.1*  MCH 34.4* 34.8* 35.8*  MCHC 32.8 33.6 34.7  RDW 12.9 13.0 12.6  PLT 211 244 213   Thyroid No results for input(s): "TSH", "FREET4" in the last 168 hours.  BNPNo results for input(s): "BNP", "PROBNP" in the last 168 hours.  DDimer No results for input(s): "DDIMER" in the last 168 hours.  Radiology    DG Chest Portable 1 View  Result Date: 08/01/2022 CLINICAL DATA:  Chest pain. EXAM: PORTABLE CHEST 1 VIEW COMPARISON:  July 28, 2022. FINDINGS: The heart size and mediastinal contours are within normal limits. Both lungs are clear. The visualized skeletal structures are unremarkable. IMPRESSION: No active disease. Electronically Signed   By: Lupita Raider M.D.   On: 08/01/2022 18:19   ECHOCARDIOGRAM COMPLETE  Result Date: 07/31/2022    ECHOCARDIOGRAM REPORT   Patient Name:   Cassandra Brooks Date of Exam: 07/31/2022 Medical Rec #:  [id number]        Height:       61.0 in Accession #:    [id number]       Weight:       166.5 lb Date of Birth:  01/11/1941        BSA:          1.747 m Patient Age:    82 years         BP:           155/65 mmHg Patient Gender: F                HR:           90 bpm. Exam Location:  Inpatient Procedure: 2D Echo, Cardiac Doppler, Color Doppler and Intracardiac            Opacification Agent Indications:    I21.9 Acute myocardial infarction  History:         Patient has prior history of Echocardiogram examinations, most                 recent 03/22/2017. Acute MI, AAA; Risk Factors:Hypertension,                 Diabetes, Dyslipidemia and Non-Smoker.  Sonographer:    Dondra Prader RVT RCS Referring Phys: [id number] Lynda Rainwater A ACHARYA IMPRESSIONS  1. Left ventricular ejection fraction, by estimation, is 65 to 70%. The left ventricle has normal function. The left ventricle has no regional wall motion abnormalities. There is mild asymmetric left ventricular hypertrophy of the basal-septal segment. Left ventricular diastolic function could not be evaluated.  2. Right ventricular systolic function is hyperdynamic. The right ventricular size is normal. Tricuspid regurgitation signal is inadequate for assessing PA pressure.  3. Left atrial size was mildly dilated.  4. The mitral valve is grossly normal. No evidence of mitral valve regurgitation. No evidence of mitral stenosis. Moderate mitral annular calcification.  5. The aortic valve is tricuspid. Aortic valve regurgitation is not visualized. No aortic stenosis is present.  6. The inferior vena cava is normal in size with greater than 50% respiratory variability, suggesting right atrial pressure of 3 mmHg. Comparison(s): LVEF more vigorous from prior reporting. FINDINGS  Left Ventricle: Left ventricular ejection fraction, by estimation, is 65 to 70%. The left ventricle has normal function. The left ventricle has no regional wall motion abnormalities. Definity contrast agent was given IV to delineate the left ventricular  endocardial borders. The left ventricular internal cavity size was normal in size. There is mild asymmetric left ventricular hypertrophy of the basal-septal segment. Left ventricular diastolic function could not be evaluated due to mitral annular calcification (moderate or greater). Left ventricular diastolic function could not be evaluated. Right Ventricle: The right ventricular size is normal. No increase in  right ventricular wall thickness. Right ventricular systolic function is hyperdynamic. Tricuspid regurgitation signal is inadequate for assessing PA pressure. Left Atrium: Left atrial  size was mildly dilated. Right Atrium: Right atrial size was normal in size. Pericardium: There is no evidence of pericardial effusion. Presence of epicardial fat layer. Mitral Valve: The mitral valve is grossly normal. Moderate mitral annular calcification. No evidence of mitral valve regurgitation. No evidence of mitral valve stenosis. Tricuspid Valve: The tricuspid valve is normal in structure. Tricuspid valve regurgitation is mild . No evidence of tricuspid stenosis. Aortic Valve: The aortic valve is tricuspid. Aortic valve regurgitation is not visualized. No aortic stenosis is present. Aortic valve mean gradient measures 4.0 mmHg. Aortic valve peak gradient measures 6.6 mmHg. Aortic valve area, by VTI measures 2.13 cm. Pulmonic Valve: The pulmonic valve was not well visualized. Pulmonic valve regurgitation is not visualized. No evidence of pulmonic stenosis. Aorta: The aortic root and ascending aorta are structurally normal, with no evidence of dilitation. Venous: The inferior vena cava is normal in size with greater than 50% respiratory variability, suggesting right atrial pressure of 3 mmHg. IAS/Shunts: No atrial level shunt detected by color flow Doppler.  LEFT VENTRICLE PLAX 2D LVIDd:         4.20 cm   Diastology LVIDs:         2.50 cm   LV e' medial:    4.25 cm/s LV PW:         1.30 cm   LV E/e' medial:  14.4 LV IVS:        0.90 cm   LV e' lateral:   5.81 cm/s LVOT diam:     2.00 cm   LV E/e' lateral: 10.6 LV SV:         48 LV SV Index:   27 LVOT Area:     3.14 cm  RIGHT VENTRICLE             IVC RV Basal diam:  2.80 cm     IVC diam: 1.30 cm RV Mid diam:    2.70 cm RV S prime:     20.20 cm/s TAPSE (M-mode): 1.9 cm LEFT ATRIUM             Index        RIGHT ATRIUM           Index LA diam:        3.90 cm 2.23 cm/m   RA Area:      11.00 cm LA Vol (A2C):   57.5 ml 32.91 ml/m  RA Volume:   23.20 ml  13.28 ml/m LA Vol (A4C):   61.3 ml 35.08 ml/m LA Biplane Vol: 60.2 ml 34.46 ml/m  AORTIC VALVE                    PULMONIC VALVE AV Area (Vmax):    2.22 cm     PV Vmax:          0.85 m/s AV Area (Vmean):   2.19 cm     PV Peak grad:     2.9 mmHg AV Area (VTI):     2.13 cm     PR End Diast Vel: 2.88 msec AV Vmax:           128.00 cm/s AV Vmean:          86.600 cm/s AV VTI:            0.224 m AV Peak Grad:      6.6 mmHg AV Mean Grad:      4.0 mmHg LVOT Vmax:  90.30 cm/s LVOT Vmean:        60.300 cm/s LVOT VTI:          0.152 m LVOT/AV VTI ratio: 0.68  AORTA Ao Root diam: 3.10 cm Ao Asc diam:  3.10 cm Ao Arch diam: 2.9 cm MITRAL VALVE                TRICUSPID VALVE MV Area (PHT): 2.93 cm     TR Peak grad:   15.8 mmHg MV Decel Time: 259 msec     TR Vmax:        199.00 cm/s MV E velocity: 61.30 cm/s MV A velocity: 103.00 cm/s  SHUNTS MV E/A ratio:  0.60         Systemic VTI:  0.15 m                             Systemic Diam: 2.00 cm Riley Lam MD Electronically signed by Riley Lam MD Signature Date/Time: 07/31/2022/1:25:38 PM    Final    CT HEAD WO CONTRAST ( )  Result Date: 07/31/2022 CLINICAL DATA:  82 year old female with headache, neurologic deficit. EXAM: CT HEAD WITHOUT CONTRAST TECHNIQUE: Contiguous axial images were obtained from the base of the skull through the vertex without intravenous contrast. RADIATION DOSE REDUCTION: This exam was performed according to the departmental dose-optimization program which includes automated exposure control, adjustment of the mA and/or kV according to patient size and/or use of iterative reconstruction technique. COMPARISON:  Brain MRI 10/08/2020.  Head CT 10/07/2020. FINDINGS: Brain: Stable cerebral volume. No midline shift, ventriculomegaly, mass effect, evidence of mass lesion, intracranial hemorrhage or evidence of cortically based acute infarction. A small but  chronic appearing infarct in the right inferior cerebellum appears to be new. Patchy bilateral scattered cerebral white matter hypodensity, including some deep white matter capsule involvement, appears stable. Chronic basal ganglia heterogeneity is stable. Vascular: Calcified atherosclerosis at the skull base. No suspicious intracranial vascular hyperdensity. Skull: No acute osseous abnormality identified. Sinuses/Orbits: Visualized paranasal sinuses and mastoids are clear. Other: Visualized orbits and scalp soft tissues are within normal limits. IMPRESSION: 1. 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 2. No acute intracranial abnormality identified. And otherwise stable non contrast CT appearance of chronic small vessel disease. Electronically Signed   By: Odessa Fleming M.D.   On: 07/31/2022 09:52    Cardiac Studies     Patient Profile     82 y.o. female with history of HTN, HLD, DM, AAA repair and CAD. She was admitted 07/28/22 with an inferior STEMI. Small PDA occluded and treated with balloon angioplasty alone. No stent was placed. Minimal disease in the other coronary arteries. Her troponin peaked over 15,000. She was chest pain free and discharged home on 08/01/22. She was readmitted with chest pain on the evening of 08/01/22. EKG with persistent subtle inferior ST elevation. Troponin 900, likely still downtrending from prior peak.   Assessment & Plan    CAD/Inferior STEMI: Readmitted last night with chest pain that was different from her pain when she presented with the MI on 07/28/22. Troponin trending down. EKG unchanged. No indication for repeat cardiac cath as the PDA was small and the other vessels have mild disease. Continue ASA, Brilinta, Metoprolol and statin. Will d/c IV heparin and IV NTG. Continue Protonix for possible GERD related chest pain. Limited echo today to evaluate LV function.  HTN: BP is well controlled.  HLD: Continue high intensity  statin  Given advanced age and chest pain, I think it is reasonable to monitor her on telemetry today. No plans for cardiac interventions today. Continue Protonix. Transfer to telemetry with probable d/c home tomorrow.   For questions or updates, please contact [facility] Please consult [email or web address] for contact info under        Signed, Verne Carrow, MD  08/02/2022, 8:05 AM

## 2022-08-02 NOTE — Progress Notes (Signed)
Pt eating lunch

## 2022-08-02 NOTE — Progress Notes (Signed)
ANTICOAGULATION CONSULT NOTE - Follow-up Consult  Pharmacy Consult for heparin Indication: chest pain/ACS  No Known Allergies  Patient Measurements: Weight: 74.9 kg (165 lb 2 oz) Heparin Dosing Weight: 64kg  Vital Signs: Temp: 98 F (36.7 C) (04/22 2130) Temp Source: Oral (04/22 2130) BP: 124/62 (04/23 0315) Pulse Rate: 73 (04/23 0315)  Labs: Recent Labs    08/01/22 0236 08/01/22 1756 08/02/22 0026  HGB 13.3 14.8 12.8  HCT 40.6 44.0 36.9  PLT 211 244 213  HEPARINUNFRC  --   --  0.25*  CREATININE 0.97 0.95 0.95  TROPONINIHS  --  1,082* 961*     Estimated Creatinine Clearance: 42.2 mL/min (by C-G formula based on SCr of 0.95 mg/dL).   Medical History: Past Medical History:  Diagnosis Date   AAA (abdominal aortic aneurysm)    Arthritis    "knees" (03/21/2017)   Borderline diabetes    Bulging lumbar disc    Depression    Disequilibrium    Dizziness    Essential hypertension 12/19/2019   Fall    Fibromyalgia    GERD (gastroesophageal reflux disease)    Glaucoma, both eyes    Hemoptysis 03/18/2015   High cholesterol    "don't have it but I take RX" (03/21/2017)   Hypertension    Mixed dyslipidemia 12/19/2019   Nuclear cataract of both eyes 01/01/2014   Pre-diabetes    pt states she is not diabetic. Last A1C was normal. Pt is on no medications for DM   Preoperative cardiovascular examination 12/19/2019   Primary osteoarthritis of left knee 07/18/2019   Solitary pulmonary nodule 03/18/2015   03/2015 6mm RLL nodule    Status post AAA (abdominal aortic aneurysm) repair 12/19/2019   Status post total left knee replacement 03/27/2020   Syncope 03/21/2017   Syncope and collapse     Assessment: 56 YOF presenting with CP, discharged this AM for same s/p cath/PCI, on DAPT.    Goal of Therapy:  Heparin level 0.3-0.7 units/ml Monitor platelets by anticoagulation protocol: Yes   4/23 AM Update Heparin level subtherapeutic at 0.25 with infusion rate of  750units/hr.  Hgb 14.8>>12.8, Plt 244>>213, No noted bleeding, No interruptions to infusion noted.  Plan:  Increase heparin infusion rate to 850units/hr Recheck HL in 8 hours Continue to monitor CBC and for signs/symptoms of bleeding  Fredirick Lathe, Student Pharmacist 08/02/2022 3:35 AM

## 2022-08-02 NOTE — Progress Notes (Signed)
  Echocardiogram 2D Echocardiogram has been performed.  Maren Reamer 08/02/2022, 2:51 PM

## 2022-08-03 ENCOUNTER — Other Ambulatory Visit (HOSPITAL_COMMUNITY): Payer: Self-pay

## 2022-08-03 DIAGNOSIS — I10 Essential (primary) hypertension: Secondary | ICD-10-CM | POA: Diagnosis not present

## 2022-08-03 DIAGNOSIS — R072 Precordial pain: Secondary | ICD-10-CM

## 2022-08-03 LAB — CBC
HCT: 38.6 % (ref 36.0–46.0)
Hemoglobin: 12.8 g/dL (ref 12.0–15.0)
MCH: 34.8 pg — ABNORMAL HIGH (ref 26.0–34.0)
MCHC: 33.2 g/dL (ref 30.0–36.0)
MCV: 104.9 fL — ABNORMAL HIGH (ref 80.0–100.0)
Platelets: 209 10*3/uL (ref 150–400)
RBC: 3.68 MIL/uL — ABNORMAL LOW (ref 3.87–5.11)
RDW: 12.7 % (ref 11.5–15.5)
WBC: 4.9 10*3/uL (ref 4.0–10.5)
nRBC: 0 % (ref 0.0–0.2)

## 2022-08-03 LAB — BASIC METABOLIC PANEL
Anion gap: 9 (ref 5–15)
BUN: 22 mg/dL (ref 8–23)
CO2: 20 mmol/L — ABNORMAL LOW (ref 22–32)
Calcium: 8.7 mg/dL — ABNORMAL LOW (ref 8.9–10.3)
Chloride: 108 mmol/L (ref 98–111)
Creatinine, Ser: 0.95 mg/dL (ref 0.44–1.00)
GFR, Estimated: 60 mL/min — ABNORMAL LOW (ref >60)
Glucose, Bld: 140 mg/dL — ABNORMAL HIGH (ref 70–99)
Potassium: 3.5 mmol/L (ref 3.5–5.1)
Sodium: 137 mmol/L (ref 135–145)

## 2022-08-03 MED ORDER — LISINOPRIL 20 MG PO TABS: 40.0000 mg | ORAL_TABLET | Freq: Every day | ORAL | Status: DC

## 2022-08-03 MED ORDER — POTASSIUM CHLORIDE CRYS ER 20 MEQ PO TBCR
40.0000 meq | EXTENDED_RELEASE_TABLET | Freq: Once | ORAL | Status: AC
  Administered 2022-08-03: 40 meq via ORAL
  Filled 2022-08-03: qty 2

## 2022-08-03 MED ORDER — PANTOPRAZOLE SODIUM 40 MG PO TBEC
40.0000 mg | DELAYED_RELEASE_TABLET | Freq: Every day | ORAL | 0 refills | Status: DC
  Filled 2022-08-03: qty 90, 90d supply, fill #0

## 2022-08-03 MED ORDER — LISINOPRIL 40 MG PO TABS: 40.0000 mg | ORAL_TABLET | Freq: Every day | ORAL | 0 refills | Status: DC

## 2022-08-03 MED ORDER — LISINOPRIL 20 MG PO TABS
20.0000 mg | ORAL_TABLET | Freq: Once | ORAL | Status: AC
  Administered 2022-08-03: 20 mg via ORAL
  Filled 2022-08-03: qty 1

## 2022-08-03 NOTE — Discharge Summary (Addendum)
Discharge Summary    Patient ID: Cassandra Brooks MRN: [id number]; DOB: [birth year]  Admit date: 08/01/2022 Discharge date: 08/03/2022  PCP:  Hurshel Party, NP   [facility] Providers Cardiologist:  Garwin Brothers, MD     Discharge Diagnoses    Principal Problem:   ACS (acute coronary syndrome) Active Problems:   Hyperlipidemia   GERD (gastroesophageal reflux disease)   Essential hypertension   STEMI (ST elevation myocardial infarction)   Diagnostic Studies/Procedures    Echo: 08/02/2022  IMPRESSIONS     1. Left ventricular ejection fraction, by estimation, is 65 to 70%. The  left ventricle has normal function. The left ventricle has no regional  wall motion abnormalities. Left ventricular diastolic function could not  be evaluated.   2. Right ventricular systolic function is normal. The right ventricular  size is normal.   3. The mitral valve is grossly normal. No evidence of mitral valve  regurgitation. No evidence of mitral stenosis.   4. The aortic valve is grossly normal. Aortic valve regurgitation is not  visualized.   5. The inferior vena cava is normal in size with greater than 50%  respiratory variability, suggesting right atrial pressure of 3 mmHg.   Comparison(s): No significant change from prior study. Prior images  reviewed side by side.   FINDINGS   Left Ventricle: Left ventricular ejection fraction, by estimation, is 65  to 70%. The left ventricle has normal function. The left ventricle has no  regional wall motion abnormalities. Left ventricular diastolic function  could not be evaluated.   Right Ventricle: The right ventricular size is normal. Right ventricular  systolic function is normal.   Left Atrium: Left atrial size was normal in size.   Right Atrium: Right atrial size was normal in size.   Mitral Valve: The mitral valve is grossly normal. Mild to moderate mitral  annular calcification. No evidence of mitral valve stenosis.    Tricuspid Valve: The tricuspid valve is not assessed.   Aortic Valve: The aortic valve is grossly normal. Aortic valve  regurgitation is not visualized.   Pulmonic Valve: The pulmonic valve was not assessed.   Aorta: The aortic root was not well visualized.   Venous: The inferior vena cava is normal in size with greater than 50%  respiratory variability, suggesting right atrial pressure of 3 mmHg.   IAS/Shunts: No atrial level shunt detected by color flow Doppler.   Additional Comments: Spectral Doppler performed. Color Doppler performed.     _____________   History of Present Illness     Jadine Brumley is a 82 y.o. female with a history of CAD status post inferior STEMI 07/2022, hypertension, hyperlipidemia, diabetes, AAA repair who presents with chest pain.  She was admitted with inferior STEMI 07/29/2022.  Cath showed occlusion of small right PDA, underwent POBA but no stent was placed due to small vessel size; TIMI-3 flow was achieved.  Otherwise patient had mild nonobstructive disease with 40% mid LAD stenosis, 40% OM1, 20% proximal LCx.  Echocardiogram showed EF 65 to 70%.  She was discharged on 4/22 on aspirin and Brilinta.  Stated that around 2 PM began having onset of chest pain.  Described as pressure in center of chest but also with burning sensation.  Stated that with her STEMI her pain was more in her shoulders and back but at that time it was more in her chest.  Given symptoms, prompted her to present to the ED.  Initial vital signs showed BP 151/74,  pulse 96, SpO2 95% on room air.  Labs are pending.  EKG showed sinus rhythm, rate 84, 1 mm inferior ST elevations.  Chest x-ray unremarkable. She was admitted for further management.   Hospital Course     Chest pain Recent inferior STEMI -- She was readmitted on the same day of discharge health 07/2020 with chest pain which was different from her original pain which she presented with her STEMI.  Troponin was noted to be  downtrending and EKG unchanged.  There was no indication for repeat cardiac catheterization as her PDA vessel was small and other vessels with mild disease.  Repeat limited echocardiogram 07/2021 with normal LV function.  It was felt that her pain was likely GI related. -- Continue aspirin, Brilinta, metoprolol, statin -- She was switched from Prilosec to Protonix  GERD -- as above, switched to protonix  HTN -- Blood pressures remained elevated during admission -- Continue metoprolol 25 mg twice daily, amlodipine 10 mg daily, increased lisinopril from 20 mg to 40 mg daily  HLD -- Continue atorvastatin 80 mg daily  Patient was seen by Dr. Clifton James and deemed stable for discharge home.  Follow-up arranged in the office.  Medication sent to patient's pharmacy of choice.  Did the patient have an acute coronary syndrome (MI, NSTEMI, STEMI, etc) this admission?:  No                               Did the patient have a percutaneous coronary intervention (stent / angioplasty)?:  No.     The patient will be scheduled for a TOC follow up appointment in 10-14 days.  A message has been sent to the [facility] and Scheduling Pool at the office where the patient should be seen for follow up.  _____________  Discharge Vitals Blood pressure (!) 160/59, pulse 89, temperature 98 F (36.7 C), temperature source Oral, resp. rate 17, weight 74.1 kg, SpO2 97 %.  Filed Weights   08/01/22 2145 08/02/22 0500 08/03/22 0344  Weight: 74.9 kg 74.8 kg 74.1 kg    Labs & Radiologic Studies    CBC Recent Labs    08/01/22 1756 08/02/22 0026 08/03/22 0004  WBC 6.1 6.9 4.9  NEUTROABS 3.6  --   --   HGB 14.8 12.8 12.8  HCT 44.0 36.9 38.6  MCV 103.5* 103.1* 104.9*  PLT 244 213 209   Basic Metabolic Panel Recent Labs    53/66/44 0026 08/03/22 0004  NA 139 137  K 3.8 3.5  CL 108 108  CO2 22 20*  GLUCOSE 136* 140*  BUN 31* 22  CREATININE 0.95 0.95  CALCIUM 9.1 8.7*  MG 2.1  --    Liver Function  Tests No results for input(s): "AST", "ALT", "ALKPHOS", "BILITOT", "PROT", "ALBUMIN" in the last 72 hours. No results for input(s): "LIPASE", "AMYLASE" in the last 72 hours. High Sensitivity Troponin:   Recent Labs  Lab 07/28/22 2257 07/29/22 0044 07/29/22 0557 08/01/22 1756 08/02/22 0026  TROPONINIHS 8,901* 9,078* 15,064* 1,082* 961*    BNP Invalid input(s): "POCBNP" D-Dimer No results for input(s): "DDIMER" in the last 72 hours. Hemoglobin A1C Recent Labs    08/02/22 0026  HGBA1C 5.6   Fasting Lipid Panel Recent Labs    08/01/22 0236  CHOL 116  HDL 38*  LDLCALC 48  TRIG 034  CHOLHDL 3.1   Thyroid Function Tests No results for input(s): "TSH", "T4TOTAL", "T3FREE", "THYROIDAB" in the  last 72 hours.  Invalid input(s): "FREET3" _____________  ECHOCARDIOGRAM LIMITED  Result Date: 08/02/2022    ECHOCARDIOGRAM LIMITED REPORT   Patient Name:   PECOLA HAXTON Date of Exam: 08/02/2022 Medical Rec #:  [id number]        Height:       61.0 in Accession #:    [id number]       Weight:       164.9 lb Date of Birth:  [birth year]        BSA:          1.740 m Patient Age:    82 years         BP:           124/82 mmHg Patient Gender: F                HR:           79 bpm. Exam Location:  Inpatient Procedure: Limited Echo, Color Doppler and Limited Color Doppler Indications:    Acute myocardial infarction, unspecified I21.9  History:        Patient has prior history of Echocardiogram examinations, most                 recent 07/31/2022. STEMI and Previous Myocardial Infarction, AAA,                 Arrythmias:ACS; Risk Factors:Dyslipidemia and Non-Smoker.  Sonographer:    Aron Baba Referring Phys: [id number] Little Ishikawa  Sonographer Comments: Image acquisition challenging due to patient body habitus and Image acquisition challenging due to respiratory motion. IMPRESSIONS  1. Left ventricular ejection fraction, by estimation, is 65 to 70%. The left ventricle has normal function. The left  ventricle has no regional wall motion abnormalities. Left ventricular diastolic function could not be evaluated.  2. Right ventricular systolic function is normal. The right ventricular size is normal.  3. The mitral valve is grossly normal. No evidence of mitral valve regurgitation. No evidence of mitral stenosis.  4. The aortic valve is grossly normal. Aortic valve regurgitation is not visualized.  5. The inferior vena cava is normal in size with greater than 50% respiratory variability, suggesting right atrial pressure of 3 mmHg. Comparison(s): No significant change from prior study. Prior images reviewed side by side. FINDINGS  Left Ventricle: Left ventricular ejection fraction, by estimation, is 65 to 70%. The left ventricle has normal function. The left ventricle has no regional wall motion abnormalities. Left ventricular diastolic function could not be evaluated. Right Ventricle: The right ventricular size is normal. Right ventricular systolic function is normal. Left Atrium: Left atrial size was normal in size. Right Atrium: Right atrial size was normal in size. Mitral Valve: The mitral valve is grossly normal. Mild to moderate mitral annular calcification. No evidence of mitral valve stenosis. Tricuspid Valve: The tricuspid valve is not assessed. Aortic Valve: The aortic valve is grossly normal. Aortic valve regurgitation is not visualized. Pulmonic Valve: The pulmonic valve was not assessed. Aorta: The aortic root was not well visualized. Venous: The inferior vena cava is normal in size with greater than 50% respiratory variability, suggesting right atrial pressure of 3 mmHg. IAS/Shunts: No atrial level shunt detected by color flow Doppler. Additional Comments: Spectral Doppler performed. Color Doppler performed.  LEFT VENTRICLE PLAX 2D LVIDd:         4.20 cm LVIDs:         2.60 cm LV PW:         1.10 cm  LV IVS:        0.90 cm  LEFT ATRIUM         Index LA diam:    3.90 cm 2.24 cm/m Rachelle Hora Croitoru MD  Electronically signed by Thurmon Fair MD Signature Date/Time: 08/02/2022/2:56:01 PM    Final    DG Chest Portable 1 View  Result Date: 08/01/2022 CLINICAL DATA:  Chest pain. EXAM: PORTABLE CHEST 1 VIEW COMPARISON:  July 28, 2022. FINDINGS: The heart size and mediastinal contours are within normal limits. Both lungs are clear. The visualized skeletal structures are unremarkable. IMPRESSION: No active disease. Electronically Signed   By: Lupita Raider M.D.   On: 08/01/2022 18:19   ECHOCARDIOGRAM COMPLETE  Result Date: 07/31/2022    ECHOCARDIOGRAM REPORT   Patient Name:   LORENNA LURRY Date of Exam: 07/31/2022 Medical Rec #:  [id number]        Height:       61.0 in Accession #:    [id number]       Weight:       166.5 lb Date of Birth:  June 11, 1940        BSA:          1.747 m Patient Age:    82 years         BP:           155/65 mmHg Patient Gender: F                HR:           90 bpm. Exam Location:  Inpatient Procedure: 2D Echo, Cardiac Doppler, Color Doppler and Intracardiac            Opacification Agent Indications:    I21.9 Acute myocardial infarction  History:        Patient has prior history of Echocardiogram examinations, most                 recent 03/22/2017. Acute MI, AAA; Risk Factors:Hypertension,                 Diabetes, Dyslipidemia and Non-Smoker.  Sonographer:    Dondra Prader RVT RCS Referring Phys: [id number] Lynda Rainwater A ACHARYA IMPRESSIONS  1. Left ventricular ejection fraction, by estimation, is 65 to 70%. The left ventricle has normal function. The left ventricle has no regional wall motion abnormalities. There is mild asymmetric left ventricular hypertrophy of the basal-septal segment. Left ventricular diastolic function could not be evaluated.  2. Right ventricular systolic function is hyperdynamic. The right ventricular size is normal. Tricuspid regurgitation signal is inadequate for assessing PA pressure.  3. Left atrial size was mildly dilated.  4. The mitral valve is grossly normal.  No evidence of mitral valve regurgitation. No evidence of mitral stenosis. Moderate mitral annular calcification.  5. The aortic valve is tricuspid. Aortic valve regurgitation is not visualized. No aortic stenosis is present.  6. The inferior vena cava is normal in size with greater than 50% respiratory variability, suggesting right atrial pressure of 3 mmHg. Comparison(s): LVEF more vigorous from prior reporting. FINDINGS  Left Ventricle: Left ventricular ejection fraction, by estimation, is 65 to 70%. The left ventricle has normal function. The left ventricle has no regional wall motion abnormalities. Definity contrast agent was given IV to delineate the left ventricular  endocardial borders. The left ventricular internal cavity size was normal in size. There is mild asymmetric left ventricular hypertrophy of the basal-septal segment. Left ventricular diastolic function could not be evaluated due to  mitral annular calcification (moderate or greater). Left ventricular diastolic function could not be evaluated. Right Ventricle: The right ventricular size is normal. No increase in right ventricular wall thickness. Right ventricular systolic function is hyperdynamic. Tricuspid regurgitation signal is inadequate for assessing PA pressure. Left Atrium: Left atrial size was mildly dilated. Right Atrium: Right atrial size was normal in size. Pericardium: There is no evidence of pericardial effusion. Presence of epicardial fat layer. Mitral Valve: The mitral valve is grossly normal. Moderate mitral annular calcification. No evidence of mitral valve regurgitation. No evidence of mitral valve stenosis. Tricuspid Valve: The tricuspid valve is normal in structure. Tricuspid valve regurgitation is mild . No evidence of tricuspid stenosis. Aortic Valve: The aortic valve is tricuspid. Aortic valve regurgitation is not visualized. No aortic stenosis is present. Aortic valve mean gradient measures 4.0 mmHg. Aortic valve peak  gradient measures 6.6 mmHg. Aortic valve area, by VTI measures 2.13 cm. Pulmonic Valve: The pulmonic valve was not well visualized. Pulmonic valve regurgitation is not visualized. No evidence of pulmonic stenosis. Aorta: The aortic root and ascending aorta are structurally normal, with no evidence of dilitation. Venous: The inferior vena cava is normal in size with greater than 50% respiratory variability, suggesting right atrial pressure of 3 mmHg. IAS/Shunts: No atrial level shunt detected by color flow Doppler.  LEFT VENTRICLE PLAX 2D LVIDd:         4.20 cm   Diastology LVIDs:         2.50 cm   LV e' medial:    4.25 cm/s LV PW:         1.30 cm   LV E/e' medial:  14.4 LV IVS:        0.90 cm   LV e' lateral:   5.81 cm/s LVOT diam:     2.00 cm   LV E/e' lateral: 10.6 LV SV:         48 LV SV Index:   27 LVOT Area:     3.14 cm  RIGHT VENTRICLE             IVC RV Basal diam:  2.80 cm     IVC diam: 1.30 cm RV Mid diam:    2.70 cm RV S prime:     20.20 cm/s TAPSE (M-mode): 1.9 cm LEFT ATRIUM             Index        RIGHT ATRIUM           Index LA diam:        3.90 cm 2.23 cm/m   RA Area:     11.00 cm LA Vol (A2C):   57.5 ml 32.91 ml/m  RA Volume:   23.20 ml  13.28 ml/m LA Vol (A4C):   61.3 ml 35.08 ml/m LA Biplane Vol: 60.2 ml 34.46 ml/m  AORTIC VALVE                    PULMONIC VALVE AV Area (Vmax):    2.22 cm     PV Vmax:          0.85 m/s AV Area (Vmean):   2.19 cm     PV Peak grad:     2.9 mmHg AV Area (VTI):     2.13 cm     PR End Diast Vel: 2.88 msec AV Vmax:           128.00 cm/s AV Vmean:  86.600 cm/s AV VTI:            0.224 m AV Peak Grad:      6.6 mmHg AV Mean Grad:      4.0 mmHg LVOT Vmax:         90.30 cm/s LVOT Vmean:        60.300 cm/s LVOT VTI:          0.152 m LVOT/AV VTI ratio: 0.68  AORTA Ao Root diam: 3.10 cm Ao Asc diam:  3.10 cm Ao Arch diam: 2.9 cm MITRAL VALVE                TRICUSPID VALVE MV Area (PHT): 2.93 cm     TR Peak grad:   15.8 mmHg MV Decel Time: 259 msec     TR  Vmax:        199.00 cm/s MV E velocity: 61.30 cm/s MV A velocity: 103.00 cm/s  SHUNTS MV E/A ratio:  0.60         Systemic VTI:  0.15 m                             Systemic Diam: 2.00 cm Riley Lam MD Electronically signed by Riley Lam MD Signature Date/Time: 07/31/2022/1:25:38 PM    Final    CT HEAD WO CONTRAST ( )  Result Date: 07/31/2022 CLINICAL DATA:  82 year old female with headache, neurologic deficit. EXAM: CT HEAD WITHOUT CONTRAST TECHNIQUE: Contiguous axial images were obtained from the base of the skull through the vertex without intravenous contrast. RADIATION DOSE REDUCTION: This exam was performed according to the departmental dose-optimization program which includes automated exposure control, adjustment of the mA and/or kV according to patient size and/or use of iterative reconstruction technique. COMPARISON:  Brain MRI 10/08/2020.  Head CT 10/07/2020. FINDINGS: Brain: Stable cerebral volume. No midline shift, ventriculomegaly, mass effect, evidence of mass lesion, intracranial hemorrhage or evidence of cortically based acute infarction. A small but chronic appearing infarct in the right inferior cerebellum appears to be new. Patchy bilateral scattered cerebral white matter hypodensity, including some deep white matter capsule involvement, appears stable. Chronic basal ganglia heterogeneity is stable. Vascular: Calcified atherosclerosis at the skull base. No suspicious intracranial vascular hyperdensity. Skull: No acute osseous abnormality identified. Sinuses/Orbits: Visualized paranasal sinuses and mastoids are clear. Other: Visualized orbits and scalp soft tissues are within normal limits. IMPRESSION: 1. 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 2. No acute intracranial abnormality identified. And otherwise stable non contrast CT appearance of chronic small vessel disease. Electronically Signed   By: Odessa Fleming M.D.   On:  07/31/2022 09:52   CARDIAC CATHETERIZATION  Result Date: 07/29/2022   2nd Diag lesion is 20% stenosed.   Mid LAD lesion is 40% stenosed.   Prox Cx to Mid Cx lesion is 20% stenosed.   1st Mrg lesion is 30% stenosed.   Prox RCA lesion is 20% stenosed.   RPDA lesion is 100% stenosed.   Balloon angioplasty was performed.   Post intervention, there is a 20% residual stenosis.   The left ventricular systolic function is normal.   LV end diastolic pressure is normal.   The left ventricular ejection fraction is 50-55% by visual estimate. Inferior STEMI secondary to total occlusion of a small posterior descending artery branch of a dominant RCA. Mild concomitant CAD with 40% mid LAD stenosis calcified segment; left circumflex 40% diffuse OM1 stenosis with  20% proximal circumflex stenosis, and mild irregularity of the dominant RCA with total occlusion of the PDA and large posterolateral vessel. Preserved global LV contractility with EF estimate approximately 55%.  V EDP 15 mmHg. With the patient still having chest pain, PCI was performed to the PDA vessel which turned out to be a small bifurcating vessel.  Due to vessel size, stenting was not done.  The 100% occlusion was reduced to 20% with resumption of TIMI-3 flow.  RECOMMENDATION: DAPT with aspirin/Brilinta.  Medical therapy for concomitant CAD.  Aggressive lipid-lowering therapy with target LDL less than 55 and optimal blood pressure control.  Patient will be maintained on low-dose IV nitroglycerin post procedure.  Since stenting was not performed, will resume heparin following TR band removal.   DG Chest 2 View  Result Date: 07/28/2022 CLINICAL DATA:  Chest pain and shortness of breath. EXAM: CHEST - 2 VIEW COMPARISON:  PA Lat 04/22/2021 FINDINGS: Heart size and central vasculature are normal. There is calcification of the transverse aorta. The mediastinum is normally outlined. The lungs again show mild increased interstitial markings but no focal pneumonic  infiltrate. No pleural effusion is seen. Osteopenia. Comparison to the prior study reveals no significant interval change. IMPRESSION: No acute cardiopulmonary disease. Mild increased interstitial markings unchanged over the previous exam. Aortic atherosclerosis. Electronically Signed   By: Almira Bar M.D.   On: 07/28/2022 23:23   MR LUMBAR SPINE WO CONTRAST  Result Date: 07/20/2022 CLINICAL DATA:  Low back EXAM: MRI LUMBAR SPINE WITHOUT CONTRAST TECHNIQUE: Multiplanar, multisequence MR imaging of the lumbar spine was performed. No intravenous contrast was administered. COMPARISON:  MRI L Spine 02/11/21 FINDINGS: Segmentation: Standard. Last well-formed disc space is labeled L5-S1. There is a small rudimentary disc at S1-S2. Alignment:  Trace retrolisthesis L2 on L3 L4 on L5 Vertebrae: Redemonstrated multilevel degenerative endplate changes (Modic changes) throughout the lumbar spine Conus medullaris and cauda equina: Conus extends to the L2 level. Conus and cauda equina appear normal. Paraspinal and other soft tissues: Negative. Disc levels: T12-L1: Unremarkable L1-L2: Mild bilateral facet degenerative change. No significant disc bulge. No spurring. No neural foraminal narrowing. L2-L3: L2 laminectomy. No spinal canal narrowing. Moderate bilateral facet degenerative change. Circumferential disc bulge, decreased from prior. No spinal canal narrowing. Moderate bilateral neural foraminal narrowing. L3-L4: L3 laminectomy. No spinal canal narrowing. Circumferential disc bulge, improved from prior. Moderate bilateral neural foraminal narrowing. Mild bilateral facet degenerative change. L4-L5: Severe bilateral facet degenerative change. Circumferential disc bulge. Mild to moderate spinal canal narrowing. Moderate bilateral neural foraminal narrowing. Findings are unchanged from prior exam. L5-S1: Mild bilateral facet degenerative change. Circumferential disc bulge. No spinal canal narrowing. Moderate right and  moderate to severe left neural foraminal narrowing. Findings are unchanged from prior exam. IMPRESSION: 1. Postsurgical changes from L2 and L3 laminectomy (and likely discectomy) with improved/resolved spinal canal narrowing at L2-L3 and L3-L4. 2. Mild to moderate spinal canal narrowing at L4-L5, unchanged. 3. Unchanged moderate bilateral neural foraminal narrowing at L2-L3, L3-L4, and L4-L5. Unchanged moderate to severe left neural foraminal narrowing at L5-S1. Electronically Signed   By: Lorenza Cambridge M.D.   On: 07/20/2022 08:40   Disposition   Pt is being discharged home today in good condition.  Follow-up Plans & Appointments     Follow-up Information     Revankar, Aundra Dubin, MD Follow up on 08/17/2022.   Specialty: Cardiology Why: at 3pm for your follow up appt Contact information: [address] [place] [address] [phone]  Discharge Instructions     Diet - low sodium heart healthy   Complete by: As directed    Increase activity slowly   Complete by: As directed         Discharge Medications   Allergies as of 08/03/2022   No Known Allergies      Medication List     STOP taking these medications    docusate sodium 100 MG capsule Commonly known as: COLACE   methocarbamol 500 MG tablet Commonly known as: ROBAXIN   omeprazole 20 MG capsule Commonly known as: PRILOSEC       TAKE these medications    acetaminophen 500 MG tablet Commonly known as: TYLENOL Take 1,000 mg by mouth every 6 (six) hours as needed for mild pain.   amLODipine 10 MG tablet Commonly known as: NORVASC Take 10 mg by mouth daily.   amoxicillin 500 MG tablet Commonly known as: AMOXIL take 4 pills one hour prior to dental work What changed: Another medication with the same name was removed. Continue taking this medication, and follow the directions you see here.   aspirin EC 81 MG tablet Take 81 mg by mouth daily. Swallow whole.   atorvastatin 80 MG  tablet Commonly known as: LIPITOR Take 1 tablet (80 mg total) by mouth daily.   Brilinta 90 MG Tabs tablet Generic drug: ticagrelor Take 1 tablet (90 mg total) by mouth 2 (two) times daily.   diclofenac Sodium 1 % Gel Commonly known as: Voltaren Apply 2 g topically 4 (four) times daily.   dorzolamide-timolol 2-0.5 % ophthalmic solution Commonly known as: COSOPT Place 1 drop into both eyes 2 (two) times daily.   fluticasone 50 MCG/ACT nasal spray Commonly known as: FLONASE Place 2 sprays into both nostrils 2 (two) times daily.   lisinopril 40 MG tablet Commonly known as: ZESTRIL Take 1 tablet (40 mg total) by mouth daily. Start taking on: August 04, 2022 What changed:  medication strength how much to take   Lumigan 0.01 % Soln Generic drug: bimatoprost Place 1 drop into both eyes at bedtime.   metoprolol tartrate 25 MG tablet Commonly known as: LOPRESSOR Take 1 tablet (25 mg total) by mouth 2 (two) times daily.   nabumetone 750 MG tablet Commonly known as: RELAFEN Take 750 mg by mouth 2 (two) times daily.   nitroGLYCERIN 0.4 MG SL tablet Commonly known as: NITROSTAT Place 1 tablet (0.4 mg total) under the tongue every 5 (five) minutes as needed. What changed: reasons to take this   pantoprazole 40 MG tablet Commonly known as: PROTONIX Take 1 tablet (40 mg total) by mouth daily. Start taking on: August 04, 2022   venlafaxine XR 150 MG 24 hr capsule Commonly known as: EFFEXOR-XR Take 1 capsule (150 mg total) by mouth daily with breakfast.         Outstanding Labs/Studies   N/a   Duration of Discharge Encounter   Greater than 30 minutes including physician time.  Signed, Laverda Page, NP 08/03/2022, 10:45 AM  I have personally seen and examined this patient. I agree with the assessment and plan as outlined above.  See my full note from this am. Discharge home today.   Verne Carrow, MD, [facility] 08/03/2022 10:58 AM

## 2022-08-03 NOTE — Progress Notes (Signed)
Rounding Note    Patient Name: Cassandra Brooks Date of Encounter: 08/03/2022  [facility] Cardiologist: Garwin Brothers, MD   Subjective   No chest pain.   Inpatient Medications    Scheduled Meds:  amLODipine  10 mg Oral Daily   aspirin EC  81 mg Oral Daily   atorvastatin  80 mg Oral Daily   dorzolamide-timolol  1 drop Both Eyes BID   latanoprost  1 drop Both Eyes QHS   lisinopril  20 mg Oral Daily   metoprolol tartrate  25 mg Oral BID   pantoprazole  40 mg Oral Daily   ticagrelor  90 mg Oral BID   Continuous Infusions:  sodium chloride     nitroGLYCERIN Stopped (08/02/22 0851)   PRN Meds: acetaminophen, morphine injection, nitroGLYCERIN, ondansetron (ZOFRAN) IV, mouth rinse   Vital Signs    Vitals:   08/02/22 1704 08/02/22 1900 08/02/22 2317 08/03/22 0344  BP: (!) 177/67 (!) 142/67 (!) 136/56 (!) 148/45  Pulse: 94 85    Resp: Temp: 97.8 F (36.6 C) 98 F (36.7 C) 98.6 F (37 C) 98.5 F (36.9 C)  TempSrc: Axillary Oral Oral Oral  SpO2: 97% 93% 92% 94%  Weight:    74.1 kg    Intake/Output Summary (Last 24 hours) at 08/03/2022 0705 Last data filed at 08/02/2022 1605 Gross per 24 hour  Intake 26.62 ml  Output --  Net 26.62 ml      08/03/2022    3:44 AM 08/02/2022    5:00 AM 08/01/2022    9:45 PM  Last 3 Weights  Weight (lbs) 163 lb 5.8 oz 164 lb 14.5 oz 165 lb 2 oz  Weight (kg) 74.1 kg 74.8 kg 74.9 kg      Telemetry    Sinus - Personally Reviewed  ECG    Sinus, Non specific T wave abn-unchanged. residual inferior ST elevation, 1mm unchanged from post cath EKG - Personally Reviewed  Physical Exam   General: Well developed, well nourished, NAD  HEENT: OP clear, mucus membranes moist  SKIN: warm, dry. No rashes. Neuro: No focal deficits  Musculoskeletal: Muscle strength 5/5 all ext  Psychiatric: Mood and affect normal  Neck: No JVD Lungs:Clear bilaterally, no wheezes, rhonci, crackles Cardiovascular: Regular rate and  rhythm. No murmurs, gallops or rubs. Abdomen:Soft. Bowel sounds present. Non-tender.  Extremities: No lower extremity edema.   Labs    High Sensitivity Troponin:   Recent Labs  Lab 07/28/22 2257 07/29/22 0044 07/29/22 0557 08/01/22 1756 08/02/22 0026  TROPONINIHS 8,901* 9,078* 15,064* 1,082* 961*     Chemistry Recent Labs  Lab 08/01/22 1756 08/02/22 0026 08/03/22 0004  NA 138 139 137  K 3.8 3.8 3.5  CL 103 108 108  CO2 20* 22 20*  GLUCOSE 156* 136* 140*  BUN 34* 31* 22  CREATININE 0.95 0.95 0.95  CALCIUM 9.9 9.1 8.7*  MG  --  2.1  --   GFRNONAA 60* 60* 60*  ANIONGAP Lipids  Recent Labs  Lab 08/01/22 0236  CHOL 116  TRIG 148  HDL 38*  LDLCALC 48  CHOLHDL 3.1    Hematology Recent Labs  Lab 08/01/22 1756 08/02/22 0026 08/03/22 0004  WBC 6.1 6.9 4.9  RBC 4.25 3.58* 3.68*  HGB 14.8 12.8 12.8  HCT 44.0 36.9 38.6  MCV 103.5* 103.1* 104.9*  MCH 34.8* 35.8* 34.8*  MCHC 33.6 34.7 33.2  RDW 13.0 12.6 12.7  PLT 244 213 209   Thyroid No results for input(s): "TSH", "FREET4" in the last 168 hours.  BNPNo results for input(s): "BNP", "PROBNP" in the last 168 hours.  DDimer No results for input(s): "DDIMER" in the last 168 hours.   Radiology    ECHOCARDIOGRAM LIMITED  Result Date: 08/02/2022    ECHOCARDIOGRAM LIMITED REPORT   Patient Name:   Cassandra Brooks Date of Exam: 08/02/2022 Medical Rec #:  [id number]        Height:       61.0 in Accession #:    [id number]       Weight:       164.9 lb Date of Birth:  08/29/1940        BSA:          1.740 m Patient Age:    82 years         BP:           124/82 mmHg Patient Gender: F                HR:           79 bpm. Exam Location:  Inpatient Procedure: Limited Echo, Color Doppler and Limited Color Doppler Indications:    Acute myocardial infarction, unspecified I21.9  History:        Patient has prior history of Echocardiogram examinations, most                 recent 07/31/2022. STEMI and Previous Myocardial  Infarction, AAA,                 Arrythmias:ACS; Risk Factors:Dyslipidemia and Non-Smoker.  Sonographer:    Aron Baba Referring Phys: [id number] Little Ishikawa  Sonographer Comments: Image acquisition challenging due to patient body habitus and Image acquisition challenging due to respiratory motion. IMPRESSIONS  1. Left ventricular ejection fraction, by estimation, is 65 to 70%. The left ventricle has normal function. The left ventricle has no regional wall motion abnormalities. Left ventricular diastolic function could not be evaluated.  2. Right ventricular systolic function is normal. The right ventricular size is normal.  3. The mitral valve is grossly normal. No evidence of mitral valve regurgitation. No evidence of mitral stenosis.  4. The aortic valve is grossly normal. Aortic valve regurgitation is not visualized.  5. The inferior vena cava is normal in size with greater than 50% respiratory variability, suggesting right atrial pressure of 3 mmHg. Comparison(s): No significant change from prior study. Prior images reviewed side by side. FINDINGS  Left Ventricle: Left ventricular ejection fraction, by estimation, is 65 to 70%. The left ventricle has normal function. The left ventricle has no regional wall motion abnormalities. Left ventricular diastolic function could not be evaluated. Right Ventricle: The right ventricular size is normal. Right ventricular systolic function is normal. Left Atrium: Left atrial size was normal in size. Right Atrium: Right atrial size was normal in size. Mitral Valve: The mitral valve is grossly normal. Mild to moderate mitral annular calcification. No evidence of mitral valve stenosis. Tricuspid Valve: The tricuspid valve is not assessed. Aortic Valve: The aortic valve is grossly normal. Aortic valve regurgitation is not visualized. Pulmonic Valve: The pulmonic valve was not assessed. Aorta: The aortic root was not well visualized. Venous: The inferior vena cava is  normal in size with greater than 50% respiratory variability, suggesting right atrial pressure of 3 mmHg. IAS/Shunts: No atrial level shunt detected by color flow Doppler. Additional Comments: Spectral Doppler performed. Color Doppler  performed.  LEFT VENTRICLE PLAX 2D LVIDd:         4.20 cm LVIDs:         2.60 cm LV PW:         1.10 cm LV IVS:        0.90 cm  LEFT ATRIUM         Index LA diam:    3.90 cm 2.24 cm/m Rachelle Hora Croitoru MD Electronically signed by Thurmon Fair MD Signature Date/Time: 08/02/2022/2:56:01 PM    Final    DG Chest Portable 1 View  Result Date: 08/01/2022 CLINICAL DATA:  Chest pain. EXAM: PORTABLE CHEST 1 VIEW COMPARISON:  July 28, 2022. FINDINGS: The heart size and mediastinal contours are within normal limits. Both lungs are clear. The visualized skeletal structures are unremarkable. IMPRESSION: No active disease. Electronically Signed   By: Lupita Raider M.D.   On: 08/01/2022 18:19    Cardiac Studies     Patient Profile     82 y.o. female with history of HTN, HLD, DM, AAA repair and CAD. She was admitted 07/28/22 with an inferior STEMI. Small PDA occluded and treated with balloon angioplasty alone. No stent was placed. Minimal disease in the other coronary arteries. Her troponin peaked over 15,000. She was chest pain free and discharged home on 08/01/22. She was readmitted with chest pain on the evening of 08/01/22. EKG with persistent subtle inferior ST elevation. Troponin 900, likely still downtrending from prior peak.   Assessment & Plan    CAD/Inferior STEMI: Readmitted on the same day of discharge 08/01/22 with chest pain that was different from her pain when she presented with the MI on 07/28/22. Troponin trending down. EKG unchanged. No indication for repeat cardiac cath as the PDA was small and the other vessels have mild disease. Repeat limited echo 08/02/22 with normal LV function. Her pain is likely GI related. Will continue Protonix. (She had been taking Prilosec).  Continue ASA, Brilinta, Metoprolol and statin.   HTN: BP is slightly elevated and she has a headache. Will increase Lisinopril to 40 mg po daily.  HLD: Continue high intensity statin Hypokalemia: Replace potassium today  Discharge home today  For questions or updates, please contact [facility] Please consult [email or web address] for contact info under        Signed, Verne Carrow, MD  08/03/2022, 7:05 AM

## 2022-08-03 NOTE — Progress Notes (Signed)
  Transition of Care [facility]) Screening Note   Patient Details  Name: Keonia Pasko Date of Birth: 1940/06/20   Transition of Care [facility]) CM/SW Contact:    Harriet Masson, RN Phone Number: 08/03/2022, 9:39 AM    Transition of Care Department [facility]) has reviewed patient and no TOC needs have been identified at this time. We will continue to monitor patient advancement through interdisciplinary progression rounds. If new patient transition needs arise, please place a TOC consult.

## 2022-08-03 NOTE — Plan of Care (Signed)
  Problem: Education: Goal: Understanding of CV disease, CV risk reduction, and recovery process will improve Outcome: Progressing Goal: Individualized Educational Video(s) Outcome: Progressing   Problem: Activity: Goal: Ability to return to baseline activity level will improve Outcome: Progressing   Problem: Cardiovascular: Goal: Ability to achieve and maintain adequate cardiovascular perfusion will improve Outcome: Progressing Goal: Vascular access site(s) Level 0-1 will be maintained Outcome: Progressing   Problem: Health Behavior/Discharge Planning: Goal: Ability to safely manage health-related needs after discharge will improve Outcome: Progressing   Problem: Education: Goal: Understanding of cardiac disease, CV risk reduction, and recovery process will improve Outcome: Progressing Goal: Individualized Educational Video(s) Outcome: Progressing   Problem: Activity: Goal: Ability to tolerate increased activity will improve Outcome: Progressing   Problem: Cardiac: Goal: Ability to achieve and maintain adequate cardiovascular perfusion will improve Outcome: Progressing   Problem: Health Behavior/Discharge Planning: Goal: Ability to safely manage health-related needs after discharge will improve Outcome: Progressing   Problem: Education: Goal: Knowledge of General Education information will improve Description: Including pain rating scale, medication(s)/side effects and non-pharmacologic comfort measures Outcome: Progressing   Problem: Health Behavior/Discharge Planning: Goal: Ability to manage health-related needs will improve Outcome: Progressing   Problem: Clinical Measurements: Goal: Ability to maintain clinical measurements within normal limits will improve Outcome: Progressing Goal: Will remain free from infection Outcome: Progressing Goal: Diagnostic test results will improve Outcome: Progressing Goal: Respiratory complications will improve Outcome:  Progressing Goal: Cardiovascular complication will be avoided Outcome: Progressing   Problem: Activity: Goal: Risk for activity intolerance will decrease Outcome: Progressing   Problem: Nutrition: Goal: Adequate nutrition will be maintained Outcome: Progressing   Problem: Coping: Goal: Level of anxiety will decrease Outcome: Progressing   Problem: Elimination: Goal: Will not experience complications related to bowel motility Outcome: Progressing Goal: Will not experience complications related to urinary retention Outcome: Progressing   Problem: Pain Managment: Goal: General experience of comfort will improve Outcome: Progressing   Problem: Safety: Goal: Ability to remain free from injury will improve Outcome: Progressing   Problem: Skin Integrity: Goal: Risk for impaired skin integrity will decrease Outcome: Progressing   

## 2022-08-04 LAB — LIPOPROTEIN A (LPA): Lipoprotein (a): 12.6 nmol/L (ref <75.0)

## 2022-08-08 ENCOUNTER — Other Ambulatory Visit: Payer: Self-pay

## 2022-08-11 ENCOUNTER — Encounter: Payer: Self-pay | Admitting: Cardiology

## 2022-08-11 MED ORDER — VALSARTAN 160 MG PO TABS: 160.0000 mg | ORAL_TABLET | Freq: Every day | ORAL | 3 refills | Status: DC

## 2022-08-11 NOTE — Addendum Note (Signed)
Addended by: Eleonore Chiquito on: 08/11/2022 01:39 PM   Modules accepted: Orders

## 2022-08-11 NOTE — Telephone Encounter (Signed)
Recommendations reviewed with Cassandra Brooks per DPR as per Wallis Bamberg PA's note. Cassandra Brooks verbalized understanding and had no additional questions.

## 2022-08-11 NOTE — Telephone Encounter (Signed)
Spoke with [employer] (husband) per DPR who states that the pt was released from the hospital recently for an MI and placed on Lisinopril. Pt is having side affects of bad cough to the point of chocking and gaging and chest hurting from the coughing. Her pharmacist suggested changing from Lisinopril 20 mg to Losartan. Please advise.

## 2022-08-12 ENCOUNTER — Telehealth (HOSPITAL_COMMUNITY): Payer: Self-pay

## 2022-08-12 NOTE — Telephone Encounter (Signed)
Per [facility] fax referral to [place].

## 2022-08-17 ENCOUNTER — Encounter: Payer: Self-pay | Admitting: Cardiology

## 2022-08-17 ENCOUNTER — Ambulatory Visit: Payer: Medicare Other | Attending: Cardiology | Admitting: Cardiology

## 2022-08-17 VITALS — BP 154/84 | HR 88 | Ht 61.0 in | Wt 164.8 lb

## 2022-08-17 DIAGNOSIS — E782 Mixed hyperlipidemia: Secondary | ICD-10-CM

## 2022-08-17 DIAGNOSIS — I2510 Atherosclerotic heart disease of native coronary artery without angina pectoris: Secondary | ICD-10-CM | POA: Diagnosis not present

## 2022-08-17 DIAGNOSIS — E088 Diabetes mellitus due to underlying condition with unspecified complications: Secondary | ICD-10-CM

## 2022-08-17 DIAGNOSIS — I10 Essential (primary) hypertension: Secondary | ICD-10-CM | POA: Diagnosis not present

## 2022-08-17 HISTORY — DX: Atherosclerotic heart disease of native coronary artery without angina pectoris: I25.10

## 2022-08-17 MED ORDER — CLOPIDOGREL BISULFATE 75 MG PO TABS: ORAL_TABLET | ORAL | 3 refills | Status: DC

## 2022-08-17 MED ORDER — AMLODIPINE BESYLATE 5 MG PO TABS: 5.0000 mg | ORAL_TABLET | Freq: Every day | ORAL | 3 refills | Status: DC

## 2022-08-17 NOTE — Progress Notes (Signed)
Cardiology Office Note:    Date:  08/17/2022   ID:  Cassandra Brooks, DOB [birth year], MRN [id number]  PCP:  Hurshel Party, NP  Cardiologist:  Garwin Brothers, MD   Referring MD: Hurshel Party, NP    ASSESSMENT:    1. Essential hypertension   2. Diabetes mellitus due to underlying condition with unspecified complications (HCC)   3. Mixed dyslipidemia   4. Coronary artery disease involving native coronary artery of native heart without angina pectoris    PLAN:    In order of problems listed above:  Coronary artery disease: Secondary prevention stressed with the patient.  Importance of compliance with diet medication stressed and she vocalized understanding.  She was advised to ambulate to the best of her ability.  She tells me that she is intolerant to Brilinta she wants to try to stop the medication.  She just cannot stop coughing.  She feels it is Brilinta because that is the only medication that was changed.  She possibly was on ACE and this was changed to ARB.  That has not helped her.  Will stop her Brilinta beginning tomorrow morning and ensure that she will take Plavix 150 mg tomorrow morning and subsequently 75 mg daily.  This was given to her in writing. Essential hypertension: She has an element of postural hypotension.  Blood pressures from home are fine.  They are in the range of 130/70.  We will cut down her amlodipine to 5 mg daily.  She will keep a track of her blood pressures at home and send [place] a log in 2 weeks. Mixed dyslipidemia diabetes mellitus: Followed by primary care.  Diet emphasized.  Weight reduction stressed.  Lifestyle modification urged and she promises to do better. Patient will be seen in follow-up appointment in 2 months or earlier if the patient has any concerns. She will have blood work done before next visit.   Medication Adjustments/Labs and Tests Ordered: Current medicines are reviewed at length with the patient today.  Concerns regarding medicines are  outlined above.  No orders of the defined types were placed in this encounter.  No orders of the defined types were placed in this encounter.    No chief complaint on file.    History of Present Illness:    Cassandra Brooks is a 82 y.o. female.  Patient has past medical history of coronary artery disease with recent myocardial infarction.  Coronary angiography report mentioned below.  Medical management was advised.  She has history of essential hypertension mixed dyslipidemia and diabetes mellitus.  She is accompanied by her daughter.  She denies any chest pain orthopnea or PND however she mentions to me that she feels Marden Noble is giving her severe cough and affecting her quality of life and she is in pain when she coughs.  At the time of my evaluation, the patient is alert awake oriented and in no distress.  She wants the medication to be discontinued.  Past Medical History:  Diagnosis Date   AAA (abdominal aortic aneurysm) (HCC)    ACS (acute coronary syndrome) (HCC) 08/01/2022   Arthritis    "knees" (03/21/2017)   Borderline diabetes    Bulging lumbar disc    Depression    Diabetes mellitus due to underlying condition with unspecified complications (HCC) 12/19/2019   Disequilibrium    Dizziness    Essential hypertension 12/19/2019   Fall    Fibromyalgia    GERD (gastroesophageal reflux disease)    Glaucoma, both eyes  Hemoptysis 03/18/2015   Hyperlipidemia    "don't have it but I take RX" (03/21/2017)   Hypertension    Mixed dyslipidemia 12/19/2019   Mixed hyperlipidemia 04/18/2017   Nuclear cataract of both eyes 01/01/2014   Plantar fasciitis 10/21/2020   Posterior tibial tendon dysfunction (PTTD) of right lower extremity 11/16/2021   Pre-diabetes    pt states she is not diabetic. Last A1C was normal. Pt is on no medications for DM   Preoperative cardiovascular examination 12/19/2019   Primary osteoarthritis of left foot 10/21/2020   Primary osteoarthritis of left  knee 07/18/2019   Solitary pulmonary nodule 03/18/2015   03/2015 6mm RLL nodule    Spinal stenosis of lumbar region with neurogenic claudication 04/26/2021   Spinal stenosis, lumbar region with neurogenic claudication 04/26/2021   Central stenosis, L2-3 and L3-4 with moderate foramenal narrowing L4-5   Status post AAA (abdominal aortic aneurysm) repair 12/19/2019   Status post total left knee replacement 03/27/2020   STEMI (ST elevation myocardial infarction) (HCC) 08/01/2022   STEMI involving right coronary artery (HCC) 07/29/2022   Syncope 03/21/2017   Syncope and collapse     Past Surgical History:  Procedure Laterality Date   ABDOMINAL AORTIC ANEURYSM REPAIR  1966   APPENDECTOMY  1966   CARDIAC CATHETERIZATION     20+ years ago, no abnormalities found   COLONOSCOPY     CORONARY/GRAFT ACUTE MI REVASCULARIZATION N/A 07/29/2022   Procedure: Coronary/Graft Acute MI Revascularization;  Surgeon: Lennette Bihari, MD;  Location: [facility] INVASIVE CV LAB;  Service: Cardiovascular;  Laterality: N/A;   LEFT HEART CATH AND CORONARY ANGIOGRAPHY N/A 07/29/2022   Procedure: LEFT HEART CATH AND CORONARY ANGIOGRAPHY;  Surgeon: Lennette Bihari, MD;  Location: [facility] INVASIVE CV LAB;  Service: Cardiovascular;  Laterality: N/A;   LUMBAR LAMINECTOMY/DECOMPRESSION MICRODISCECTOMY N/A 04/26/2021   Procedure: CENTRAL LAMINECTOMIES LUMBAR TWO-THREE,  LUMBAR THREE-FOUR WITH BILATERAL FORAMINOTOMIES;  Surgeon: Kerrin Champagne, MD;  Location: [facility] OR;  Service: Orthopedics;  Laterality: N/A;   TOTAL KNEE ARTHROPLASTY Left 03/27/2020   Procedure: LEFT TOTAL KNEE ARTHROPLASTY;  Surgeon: Tarry Kos, MD;  Location: [facility] OR;  Service: Orthopedics;  Laterality: Left;   VAGINAL HYSTERECTOMY     "partial"   VIDEO BRONCHOSCOPY Bilateral 03/19/2015   Procedure: VIDEO BRONCHOSCOPY WITHOUT FLUORO;  Surgeon: Oretha Milch, MD;  Location: [facility] ENDOSCOPY;  Service: Cardiopulmonary;  Laterality: Bilateral;    Current  Medications: Current Meds  Medication Sig   acetaminophen (TYLENOL) 500 MG tablet Take 1,000 mg by mouth every 6 (six) hours as needed for mild pain.   amLODipine (NORVASC) 10 MG tablet Take 10 mg by mouth daily.   amoxicillin (AMOXIL) 500 MG tablet take 4 pills one hour prior to dental work   aspirin EC 81 MG tablet Take 81 mg by mouth daily. Swallow whole.   atorvastatin (LIPITOR) 80 MG tablet Take 1 tablet (80 mg total) by mouth daily.   diclofenac Sodium (VOLTAREN) 1 % GEL Apply 2 g topically 4 (four) times daily.   dorzolamide-timolol (COSOPT) 22.3-6.8 MG/ML ophthalmic solution Place 1 drop into both eyes 2 (two) times daily.   fluticasone (FLONASE) 50 MCG/ACT nasal spray Place 2 sprays into both nostrils 2 (two) times daily.   LUMIGAN 0.01 % SOLN Place 1 drop into both eyes at bedtime.   metoprolol tartrate (LOPRESSOR) 25 MG tablet Take 1 tablet (25 mg total) by mouth 2 (two) times daily.   nabumetone (RELAFEN) 750 MG tablet Take 750 mg by mouth  2 (two) times daily.   nitroGLYCERIN (NITROSTAT) 0.4 MG SL tablet Place 0.4 mg under the tongue every 5 (five) minutes as needed for chest pain.   pantoprazole (PROTONIX) 40 MG tablet Take 1 tablet (40 mg total) by mouth daily.   ticagrelor (BRILINTA) 90 MG TABS tablet Take 1 tablet (90 mg total) by mouth 2 (two) times daily.   valsartan (DIOVAN) 160 MG tablet Take 1 tablet (160 mg total) by mouth daily.   venlafaxine XR (EFFEXOR-XR) 150 MG 24 hr capsule Take 1 capsule (150 mg total) by mouth daily with breakfast.     Allergies:   Lisinopril   Social History   Socioeconomic History   Marital status: Married    Spouse name: Not on file   Number of children: Not on file   Years of education: Not on file   Highest education level: Not on file  Occupational History   Occupation: retired    Comment: Designer, fashion/clothing; dietician  Tobacco Use   Smoking status: Never   Smokeless tobacco: Never  Vaping Use   Vaping Use: Never used  Substance and  Sexual Activity   Alcohol use: No   Drug use: No   Sexual activity: Not on file  Other Topics Concern   Not on file  Social History Narrative   Not on file   Social Determinants of Health   Financial Resource Strain: Not on file  Food Insecurity: Not on file  Transportation Needs: Not on file  Physical Activity: Not on file  Stress: Not on file  Social Connections: Not on file     Family History: The patient's family history includes Colon cancer in her brother; Diabetes in her brother, brother, mother, and sister; Glaucoma in her mother; Hypertension in her father; Lung cancer in her brother; Lupus in her child; Stomach cancer in her father.  ROS:   Please see the history of present illness.    All other systems reviewed and are negative.  EKGs/Labs/Other Studies Reviewed:    The following studies were reviewed today: I discussed my findings with patient at length.   Recent Labs: 08/02/2022: Magnesium 2.1 08/03/2022: BUN 22; Creatinine, Ser 0.95; Hemoglobin 12.8; Platelets 209; Potassium 3.5; Sodium 137  Recent Lipid Panel    Component Value Date/Time   CHOL 116 08/01/2022 0236   TRIG 148 08/01/2022 0236   HDL 38 (L) 08/01/2022 0236   CHOLHDL 3.1 08/01/2022 0236   VLDL 30 08/01/2022 0236   LDLCALC 48 08/01/2022 0236    Physical Exam:    VS:  BP (!) 154/84   Pulse 88   Ht 5\' 1"  (1.549 m)   Wt 164 lb 12.8 oz (74.8 kg)   SpO2 95%   BMI 31.14 kg/m     Wt Readings from Last 3 Encounters:  08/17/22 164 lb 12.8 oz (74.8 kg)  08/03/22 163 lb 5.8 oz (74.1 kg)  07/30/22 166 lb 8 oz (75.5 kg)     GEN: Patient is in no acute distress HEENT: Normal NECK: No JVD; No carotid bruits LYMPHATICS: No lymphadenopathy CARDIAC: Hear sounds regular, 2/6 systolic murmur at the apex. RESPIRATORY:  Clear to auscultation without rales, wheezing or rhonchi  ABDOMEN: Soft, non-tender, non-distended MUSCULOSKELETAL:  No edema; No deformity  SKIN: Warm and dry NEUROLOGIC:   Alert and oriented x 3 PSYCHIATRIC:  Normal affect   Signed, Garwin Brothers, MD  08/17/2022 3:23 PM    [facility]

## 2022-08-17 NOTE — Addendum Note (Signed)
Addended by: Eleonore Chiquito on: 08/17/2022 03:41 PM   Modules accepted: Orders

## 2022-08-17 NOTE — Patient Instructions (Signed)
Medication Instructions:  Your physician has recommended you make the following change in your medication:   Decrease your Amlodipine to 5 mg daily.  Stop Ticagrelor (Brilinta) tomorrow  Take 150 mg (2 tablets) Plavix in the morning, starting Saturday it will be 75 mg (1 tablet) after the initial dose.  *If you need a refill on your cardiac medications before your next appointment, please call your pharmacy*   Lab Work: Your physician recommends that you return for lab work in: before your next visit You need to have labs done when you are fasting.  You can come Monday through Friday 8:30 am to 12:00 pm and 1:15 to 4:30. You do not need to make an appointment as the order has already been placed. The labs you are going to have done are CMP and Lipids.  If you have labs (blood work) drawn today and your tests are completely normal, you will receive your results only by: MyChart Message (if you have MyChart) OR A paper copy in the mail If you have any lab test that is abnormal or we need to change your treatment, we will call you to review the results.   Testing/Procedures: None ordered   Follow-Up: At [facility], you and your health needs are our priority.  As part of our continuing mission to provide you with exceptional heart care, we have created designated Provider Care Teams.  These Care Teams include your primary Cardiologist (physician) and Advanced Practice Providers (APPs -  Physician Assistants and Nurse Practitioners) who all work together to provide you with the care you need, when you need it.  We recommend signing up for the patient portal called "MyChart".  Sign up information is provided on this After Visit Summary.  MyChart is used to connect with patients for Virtual Visits (Telemedicine).  Patients are able to view lab/test results, encounter notes, upcoming appointments, etc.  Non-urgent messages can be sent to your provider as well.   To learn more about  what you can do with MyChart, go to [email or web address].    Your next appointment:   6 week(s)  The format for your next appointment:   In Person  Provider:   Belva Crome, MD    Other Instructions none  Important Information About Sugar

## 2022-08-18 ENCOUNTER — Encounter: Payer: Self-pay | Admitting: Cardiology

## 2022-08-21 ENCOUNTER — Encounter: Payer: Self-pay | Admitting: Cardiology

## 2022-08-22 DIAGNOSIS — F411 Generalized anxiety disorder: Secondary | ICD-10-CM | POA: Diagnosis not present

## 2022-08-22 DIAGNOSIS — I10 Essential (primary) hypertension: Secondary | ICD-10-CM | POA: Diagnosis not present

## 2022-08-22 DIAGNOSIS — F325 Major depressive disorder, single episode, in full remission: Secondary | ICD-10-CM | POA: Diagnosis not present

## 2022-08-31 DIAGNOSIS — Z1231 Encounter for screening mammogram for malignant neoplasm of breast: Secondary | ICD-10-CM | POA: Diagnosis not present

## 2022-08-31 DIAGNOSIS — M8589 Other specified disorders of bone density and structure, multiple sites: Secondary | ICD-10-CM | POA: Diagnosis not present

## 2022-09-05 ENCOUNTER — Encounter: Payer: Self-pay | Admitting: Cardiology

## 2022-09-05 DIAGNOSIS — I249 Acute ischemic heart disease, unspecified: Secondary | ICD-10-CM

## 2022-09-05 DIAGNOSIS — I2111 ST elevation (STEMI) myocardial infarction involving right coronary artery: Secondary | ICD-10-CM

## 2022-09-16 DIAGNOSIS — F411 Generalized anxiety disorder: Secondary | ICD-10-CM | POA: Diagnosis not present

## 2022-09-16 DIAGNOSIS — F325 Major depressive disorder, single episode, in full remission: Secondary | ICD-10-CM | POA: Diagnosis not present

## 2022-09-16 DIAGNOSIS — I10 Essential (primary) hypertension: Secondary | ICD-10-CM | POA: Diagnosis not present

## 2022-09-26 DIAGNOSIS — Z79899 Other long term (current) drug therapy: Secondary | ICD-10-CM | POA: Diagnosis not present

## 2022-09-26 DIAGNOSIS — I2510 Atherosclerotic heart disease of native coronary artery without angina pectoris: Secondary | ICD-10-CM | POA: Diagnosis not present

## 2022-09-26 DIAGNOSIS — F411 Generalized anxiety disorder: Secondary | ICD-10-CM | POA: Diagnosis not present

## 2022-09-26 DIAGNOSIS — I700 Atherosclerosis of aorta: Secondary | ICD-10-CM | POA: Diagnosis not present

## 2022-09-26 DIAGNOSIS — R7303 Prediabetes: Secondary | ICD-10-CM | POA: Diagnosis not present

## 2022-09-26 DIAGNOSIS — I10 Essential (primary) hypertension: Secondary | ICD-10-CM | POA: Diagnosis not present

## 2022-09-26 DIAGNOSIS — E785 Hyperlipidemia, unspecified: Secondary | ICD-10-CM | POA: Diagnosis not present

## 2022-09-26 DIAGNOSIS — M8580 Other specified disorders of bone density and structure, unspecified site: Secondary | ICD-10-CM | POA: Diagnosis not present

## 2022-09-26 DIAGNOSIS — I252 Old myocardial infarction: Secondary | ICD-10-CM | POA: Diagnosis not present

## 2022-09-29 ENCOUNTER — Encounter: Payer: Self-pay | Admitting: Cardiology

## 2022-09-29 ENCOUNTER — Ambulatory Visit: Payer: Medicare Other | Attending: Cardiology | Admitting: Cardiology

## 2022-09-29 VITALS — BP 120/66 | HR 62 | Ht 61.0 in | Wt 167.6 lb

## 2022-09-29 DIAGNOSIS — I2510 Atherosclerotic heart disease of native coronary artery without angina pectoris: Secondary | ICD-10-CM | POA: Diagnosis not present

## 2022-09-29 DIAGNOSIS — I10 Essential (primary) hypertension: Secondary | ICD-10-CM

## 2022-09-29 DIAGNOSIS — E669 Obesity, unspecified: Secondary | ICD-10-CM

## 2022-09-29 DIAGNOSIS — E782 Mixed hyperlipidemia: Secondary | ICD-10-CM | POA: Diagnosis not present

## 2022-09-29 DIAGNOSIS — E66811 Obesity, class 1: Secondary | ICD-10-CM

## 2022-09-29 HISTORY — DX: Obesity, class 1: E66.811

## 2022-09-29 NOTE — Progress Notes (Signed)
Cardiology Office Note:    Date:  09/29/2022   ID:  Cassandra Brooks, DOB 05/03/1940, MRN [id number]  PCP:  Hurshel Party, NP  Cardiologist:  Garwin Brothers, MD   Referring MD: Hurshel Party, NP    ASSESSMENT:    1. Essential hypertension   2. Coronary artery disease involving native coronary artery of native heart without angina pectoris   3. Mixed dyslipidemia   4. Obesity (BMI 30.0-34.9)    PLAN:    In order of problems listed above:  Coronary artery disease: Secondary prevention stressed with the patient.  Importance of compliance with diet medication stressed and she vocalized understanding.  She was advised to walk at least half an hour a day 5 days a week and she promises to do so. Essential hypertension: Blood pressure stable and diet was emphasized and lifestyle modification urged. Mixed dyslipidemia: On lipid-lowering medications followed by primary care.  Lipids were reviewed.  They are fine. Obesity: Weight reduction stressed risks of obesity explained and patient promises to do better. Patient will be seen in follow-up appointment in 9 months or earlier if the patient has any concerns.    Medication Adjustments/Labs and Tests Ordered: Current medicines are reviewed at length with the patient today.  Concerns regarding medicines are outlined above.  No orders of the defined types were placed in this encounter.  No orders of the defined types were placed in this encounter.    No chief complaint on file.    History of Present Illness:    Cassandra Brooks is a 82 y.o. female.  Patient has past medical history of coronary artery disease, essential hypertension, dyslipidemia and obesity.  She leads a sedentary lifestyle.  She denies any chest pain orthopnea or PND.  At the time of my evaluation, the patient is alert awake oriented and in no distress.  Past Medical History:  Diagnosis Date   AAA (abdominal aortic aneurysm) (HCC)    ACS (acute coronary syndrome)  (HCC) 08/01/2022   Arthritis    "knees" (03/21/2017)   Borderline diabetes    Bulging lumbar disc    CAD (coronary artery disease) 08/17/2022   Depression    Diabetes mellitus due to underlying condition with unspecified complications (HCC) 12/19/2019   Disequilibrium    Dizziness    Essential hypertension 12/19/2019   Fall    Fibromyalgia    GERD (gastroesophageal reflux disease)    Glaucoma, both eyes    Hemoptysis 03/18/2015   Hyperlipidemia    "don't have it but I take RX" (03/21/2017)   Hypertension    Mixed dyslipidemia 12/19/2019   Mixed hyperlipidemia 04/18/2017   Nuclear cataract of both eyes 01/01/2014   Plantar fasciitis 10/21/2020   Posterior tibial tendon dysfunction (PTTD) of right lower extremity 11/16/2021   Pre-diabetes    pt states she is not diabetic. Last A1C was normal. Pt is on no medications for DM   Preoperative cardiovascular examination 12/19/2019   Primary osteoarthritis of left foot 10/21/2020   Primary osteoarthritis of left knee 07/18/2019   Solitary pulmonary nodule 03/18/2015   03/2015 6mm RLL nodule    Spinal stenosis of lumbar region with neurogenic claudication 04/26/2021   Spinal stenosis, lumbar region with neurogenic claudication 04/26/2021   Central stenosis, L2-3 and L3-4 with moderate foramenal narrowing L4-5   Status post AAA (abdominal aortic aneurysm) repair 12/19/2019   Status post total left knee replacement 03/27/2020   STEMI (ST elevation myocardial infarction) (HCC) 08/01/2022   STEMI involving  right coronary artery (HCC) 07/29/2022   Syncope 03/21/2017   Syncope and collapse     Past Surgical History:  Procedure Laterality Date   ABDOMINAL AORTIC ANEURYSM REPAIR  1966   APPENDECTOMY  1966   CARDIAC CATHETERIZATION     20+ years ago, no abnormalities found   COLONOSCOPY     CORONARY/GRAFT ACUTE MI REVASCULARIZATION N/A 07/29/2022   Procedure: Coronary/Graft Acute MI Revascularization;  Surgeon: Lennette Bihari, MD;   [facility]: [facility];  Service: Cardiovascular;  Laterality: N/A;   LEFT HEART CATH AND CORONARY ANGIOGRAPHY N/A 07/29/2022   Procedure: LEFT HEART CATH AND CORONARY ANGIOGRAPHY;  Surgeon: Lennette Bihari, MD;  Location: [facility];  Service: Cardiovascular;  Laterality: N/A;   LUMBAR LAMINECTOMY/DECOMPRESSION MICRODISCECTOMY N/A 04/26/2021   Procedure: CENTRAL LAMINECTOMIES LUMBAR TWO-THREE,  LUMBAR THREE-FOUR WITH BILATERAL FORAMINOTOMIES;  Surgeon: Kerrin Champagne, MD;  Location: [facility];  Service: Orthopedics;  Laterality: N/A;   TOTAL KNEE ARTHROPLASTY Left 03/27/2020   Procedure: LEFT TOTAL KNEE ARTHROPLASTY;  Surgeon: Tarry Kos, MD;  Location: [facility];  Service: Orthopedics;  Laterality: Left;   VAGINAL HYSTERECTOMY     "partial"   VIDEO BRONCHOSCOPY Bilateral 03/19/2015   Procedure: VIDEO BRONCHOSCOPY WITHOUT FLUORO;  Surgeon: Oretha Milch, MD;  Location: [facility];  Service: Cardiopulmonary;  Laterality: Bilateral;    Current Medications: Current Meds  Medication Sig   acetaminophen (TYLENOL) 500 MG tablet Take 1,000 mg by mouth every 6 (six) hours as needed for mild pain.   amLODipine (NORVASC) 5 MG tablet Take 1 tablet (5 mg total) by mouth daily.   amoxicillin (AMOXIL) 500 MG tablet take 4 pills one hour prior to dental work   aspirin EC 81 MG tablet Take 81 mg by mouth daily. Swallow whole.   atorvastatin (LIPITOR) 80 MG tablet Take 1 tablet (80 mg total) by mouth daily.   clopidogrel (PLAVIX) 75 MG tablet Take 150 mg (2 tablets) on Thursday then 75 mg (1 tablet) daily after the initial dose.   diclofenac Sodium (VOLTAREN) 1 % GEL Apply 2 g topically 4 (four) times daily.   dorzolamide-timolol (COSOPT) 22.3-6.8 MG/ML ophthalmic solution Place 1 drop into both eyes 2 (two) times daily.   fluticasone (FLONASE) 50 MCG/ACT nasal spray Place 2 sprays into both nostrils 2 (two) times daily.   hydrochlorothiazide (HYDRODIURIL) 12.5 MG tablet Take 12.5 mg by mouth  daily.   LUMIGAN 0.01 % SOLN Place 1 drop into both eyes at bedtime.   metoprolol tartrate (LOPRESSOR) 25 MG tablet Take 1 tablet (25 mg total) by mouth 2 (two) times daily.   nabumetone (RELAFEN) 750 MG tablet Take 750 mg by mouth 2 (two) times daily.   nitroGLYCERIN (NITROSTAT) 0.4 MG SL tablet Place 0.4 mg under the tongue every 5 (five) minutes as needed for chest pain.   pantoprazole (PROTONIX) 40 MG tablet Take 1 tablet (40 mg total) by mouth daily.   valsartan (DIOVAN) 160 MG tablet Take 1 tablet (160 mg total) by mouth daily.   venlafaxine XR (EFFEXOR-XR) 150 MG 24 hr capsule Take 1 capsule (150 mg total) by mouth daily with breakfast.     Allergies:   Lisinopril   Social History   Socioeconomic History   Marital status: Married    Spouse name: Not on file   Number of children: Not on file   Years of education: Not on file   Highest education level: Not on file  Occupational History  Occupation: retired    Comment: Designer, fashion/clothing; dietician  Tobacco Use   Smoking status: Never   Smokeless tobacco: Never  Vaping Use   Vaping Use: Never used  Substance and Sexual Activity   Alcohol use: No   Drug use: No   Sexual activity: Not on file  Other Topics Concern   Not on file  Social History Narrative   Not on file   Social Determinants of Health   Financial Resource Strain: Not on file  Food Insecurity: Not on file  Transportation Needs: Not on file  Physical Activity: Not on file  Stress: Not on file  Social Connections: Not on file     Family History: The patient's family history includes Colon cancer in her brother; Diabetes in her brother, brother, mother, and sister; Glaucoma in her mother; Hypertension in her father; Lung cancer in her brother; Lupus in her child; Stomach cancer in her father.  ROS:   Please see the history of present illness.    All other systems reviewed and are negative.  EKGs/Labs/Other Studies Reviewed:    The following studies were  reviewed today: I discussed my findings with the patient at length   Recent Labs: 08/02/2022: Magnesium 2.1 08/03/2022: BUN 22; Creatinine, Ser 0.95; Hemoglobin 12.8; Platelets 209; Potassium 3.5; Sodium 137  Recent Lipid Panel    Component Value Date/Time   CHOL 116 08/01/2022 0236   TRIG 148 08/01/2022 0236   HDL 38 (L) 08/01/2022 0236   CHOLHDL 3.1 08/01/2022 0236   VLDL 30 08/01/2022 0236   LDLCALC 48 08/01/2022 0236    Physical Exam:    VS:  BP 120/66   Pulse 62   Ht 5\' 1"  (1.549 m)   Wt 167 lb 9.6 oz (76 kg)   SpO2 94%   BMI 31.67 kg/m     Wt Readings from Last 3 Encounters:  09/29/22 167 lb 9.6 oz (76 kg)  08/17/22 164 lb 12.8 oz (74.8 kg)  08/03/22 163 lb 5.8 oz (74.1 kg)     GEN: Patient is in no acute distress HEENT: Normal NECK: No JVD; No carotid bruits LYMPHATICS: No lymphadenopathy CARDIAC: Hear sounds regular, 2/6 systolic murmur at the apex. RESPIRATORY:  Clear to auscultation without rales, wheezing or rhonchi  ABDOMEN: Soft, non-tender, non-distended MUSCULOSKELETAL:  No edema; No deformity  SKIN: Warm and dry NEUROLOGIC:  Alert and oriented x 3 PSYCHIATRIC:  Normal affect   Signed, Garwin Brothers, MD  09/29/2022 8:29 AM    [facility]

## 2022-09-29 NOTE — Patient Instructions (Signed)
Medication Instructions:  Your physician has recommended you make the following change in your medication:   Stop Plavix  *If you need a refill on your cardiac medications before your next appointment, please call your pharmacy*   Lab Work: None ordered If you have labs (blood work) drawn today and your tests are completely normal, you will receive your results only by: MyChart Message (if you have MyChart) OR A paper copy in the mail If you have any lab test that is abnormal or we need to change your treatment, we will call you to review the results.   Testing/Procedures: None ordered   Follow-Up: At [facility], you and your health needs are our priority.  As part of our continuing mission to provide you with exceptional heart care, we have created designated Provider Care Teams.  These Care Teams include your primary Cardiologist (physician) and Advanced Practice Providers (APPs -  Physician Assistants and Nurse Practitioners) who all work together to provide you with the care you need, when you need it.  We recommend signing up for the patient portal called "MyChart".  Sign up information is provided on this After Visit Summary.  MyChart is used to connect with patients for Virtual Visits (Telemedicine).  Patients are able to view lab/test results, encounter notes, upcoming appointments, etc.  Non-urgent messages can be sent to your provider as well.   To learn more about what you can do with MyChart, go to [email or web address].    Your next appointment:   9 month(s)  The format for your next appointment:   In Person  Provider:   Belva Crome, MD    Other Instructions none  Important Information About Sugar

## 2022-09-29 NOTE — Addendum Note (Signed)
Addended by: Eleonore Chiquito on: 09/29/2022 08:39 AM   Modules accepted: Orders

## 2022-09-30 DIAGNOSIS — H401131 Primary open-angle glaucoma, bilateral, mild stage: Secondary | ICD-10-CM | POA: Diagnosis not present

## 2022-09-30 DIAGNOSIS — H2513 Age-related nuclear cataract, bilateral: Secondary | ICD-10-CM | POA: Diagnosis not present

## 2022-10-28 ENCOUNTER — Other Ambulatory Visit: Payer: Self-pay | Admitting: Cardiology

## 2022-11-07 DIAGNOSIS — R399 Unspecified symptoms and signs involving the genitourinary system: Secondary | ICD-10-CM | POA: Diagnosis not present

## 2022-11-07 DIAGNOSIS — A499 Bacterial infection, unspecified: Secondary | ICD-10-CM | POA: Diagnosis not present

## 2022-11-07 DIAGNOSIS — N390 Urinary tract infection, site not specified: Secondary | ICD-10-CM | POA: Diagnosis not present

## 2022-11-07 DIAGNOSIS — R748 Abnormal levels of other serum enzymes: Secondary | ICD-10-CM | POA: Diagnosis not present

## 2022-11-10 ENCOUNTER — Other Ambulatory Visit: Payer: Self-pay

## 2022-11-10 ENCOUNTER — Telehealth: Payer: Self-pay | Admitting: Cardiology

## 2022-11-10 MED ORDER — METOPROLOL TARTRATE 25 MG PO TABS: 25.0000 mg | ORAL_TABLET | Freq: Two times a day (BID) | ORAL | 3 refills | Status: DC

## 2022-11-10 NOTE — Telephone Encounter (Signed)
Metoprolol 25mg  BID #180 ref x 3 sent to [facility]

## 2022-11-10 NOTE — Telephone Encounter (Signed)
*  STAT* If patient is at the pharmacy, call can be transferred to refill team.   1. Which medications need to be refilled? (please list name of each medication and dose if known)  metoprolol tartrate (LOPRESSOR) 25 MG tablet   4. Which pharmacy/location (including street and city if local pharmacy) is medication to be sent to?  [id number] [place] [place] - [address] [facility]     5. Do they need a 30 day or 90 day supply? 90 day   Will be out of medication on Monday 08/05.

## 2022-11-23 DIAGNOSIS — I2510 Atherosclerotic heart disease of native coronary artery without angina pectoris: Secondary | ICD-10-CM | POA: Diagnosis not present

## 2022-11-23 DIAGNOSIS — E782 Mixed hyperlipidemia: Secondary | ICD-10-CM | POA: Diagnosis not present

## 2022-11-25 ENCOUNTER — Telehealth: Payer: Self-pay

## 2022-11-25 NOTE — Telephone Encounter (Signed)
-----   Message from Aundra Dubin Revankar sent at 11/24/2022  8:53 AM EDT ----- The results of the study is unremarkable. Please inform patient. I will discuss in detail at next appointment. Cc  primary care/referring physician Garwin Brothers, MD 11/24/2022 8:53 AM

## 2022-11-25 NOTE — Telephone Encounter (Signed)
Left vm to call back

## 2022-11-28 ENCOUNTER — Telehealth: Payer: Self-pay

## 2022-11-28 DIAGNOSIS — N390 Urinary tract infection, site not specified: Secondary | ICD-10-CM | POA: Diagnosis not present

## 2022-11-28 NOTE — Telephone Encounter (Signed)
Spoke with spouse- per [facility]- regarding lab results per Dr. Kem Parkinson note. Spouse verbalized understanding and had no further questions.

## 2022-12-05 DIAGNOSIS — H401131 Primary open-angle glaucoma, bilateral, mild stage: Secondary | ICD-10-CM | POA: Diagnosis not present

## 2022-12-14 DIAGNOSIS — Z23 Encounter for immunization: Secondary | ICD-10-CM | POA: Diagnosis not present

## 2022-12-30 DIAGNOSIS — F411 Generalized anxiety disorder: Secondary | ICD-10-CM | POA: Diagnosis not present

## 2022-12-30 DIAGNOSIS — E785 Hyperlipidemia, unspecified: Secondary | ICD-10-CM | POA: Diagnosis not present

## 2022-12-30 DIAGNOSIS — M1990 Unspecified osteoarthritis, unspecified site: Secondary | ICD-10-CM | POA: Diagnosis not present

## 2022-12-30 DIAGNOSIS — Z6830 Body mass index (BMI) 30.0-30.9, adult: Secondary | ICD-10-CM | POA: Diagnosis not present

## 2022-12-30 DIAGNOSIS — F325 Major depressive disorder, single episode, in full remission: Secondary | ICD-10-CM | POA: Diagnosis not present

## 2022-12-30 DIAGNOSIS — M8580 Other specified disorders of bone density and structure, unspecified site: Secondary | ICD-10-CM | POA: Diagnosis not present

## 2022-12-30 DIAGNOSIS — R197 Diarrhea, unspecified: Secondary | ICD-10-CM | POA: Diagnosis not present

## 2022-12-30 DIAGNOSIS — R7303 Prediabetes: Secondary | ICD-10-CM | POA: Diagnosis not present

## 2022-12-30 DIAGNOSIS — Z79899 Other long term (current) drug therapy: Secondary | ICD-10-CM | POA: Diagnosis not present

## 2022-12-30 DIAGNOSIS — R399 Unspecified symptoms and signs involving the genitourinary system: Secondary | ICD-10-CM | POA: Diagnosis not present

## 2022-12-30 DIAGNOSIS — I10 Essential (primary) hypertension: Secondary | ICD-10-CM | POA: Diagnosis not present

## 2023-03-17 ENCOUNTER — Encounter: Payer: Self-pay | Admitting: Orthopaedic Surgery

## 2023-03-17 ENCOUNTER — Ambulatory Visit (INDEPENDENT_AMBULATORY_CARE_PROVIDER_SITE_OTHER): Payer: Medicare Other | Admitting: Orthopaedic Surgery

## 2023-03-17 ENCOUNTER — Other Ambulatory Visit (INDEPENDENT_AMBULATORY_CARE_PROVIDER_SITE_OTHER): Payer: Medicare Other

## 2023-03-17 DIAGNOSIS — Z96652 Presence of left artificial knee joint: Secondary | ICD-10-CM

## 2023-03-17 NOTE — Progress Notes (Signed)
Patient: Cassandra Brooks           Date of Birth: 02-14-1941           MRN: [id number] Visit Date: 03/17/2023 PCP: Hurshel Party, NP   Assessment & Plan:  Chief Complaint:  Chief Complaint  Patient presents with   Left Knee - Pain    Left TKA 03/27/2020   Visit Diagnoses:  1. Status post total left knee replacement     Plan: Cassandra Brooks is 3 years status post left total knee replacement.  She is doing well has no problems.  She has no complaints.  She is very happy.  Exam of the left knee shows a fully healed surgical scar.  Excellent range of motion.  Collaterals are stable.  No joint effusion.  Cassandra Brooks has done very well from her surgery.  At this point she can follow-up as needed or every couple years if she wants to.  Follow-Up Instructions: No follow-ups on file.   Orders:  Orders Placed This Encounter  Procedures   XR Knee 1-2 Views Left   No orders of the defined types were placed in this encounter.   Imaging: XR Knee 1-2 Views Left  Result Date: 03/17/2023 X-rays of the left knee show a stable left total knee replacement in good alignment.    PMFS History: Patient Active Problem List   Diagnosis Date Noted   Obesity (BMI 30.0-34.9) 09/29/2022   CAD (coronary artery disease) 08/17/2022   ACS (acute coronary syndrome) (HCC) 08/01/2022   STEMI (ST elevation myocardial infarction) (HCC) 08/01/2022   STEMI involving right coronary artery (HCC) 07/29/2022   Posterior tibial tendon dysfunction (PTTD) of right lower extremity 11/16/2021   Spinal stenosis, lumbar region with neurogenic claudication 04/26/2021   Spinal stenosis of lumbar region with neurogenic claudication 04/26/2021   Primary osteoarthritis of left foot 10/21/2020   Plantar fasciitis 10/21/2020   Pre-diabetes    Status post total left knee replacement 03/27/2020   Essential hypertension 12/19/2019   Mixed dyslipidemia 12/19/2019   Diabetes mellitus due to underlying condition with unspecified  complications (HCC) 12/19/2019   Preoperative cardiovascular examination 12/19/2019   Status post AAA (abdominal aortic aneurysm) repair 12/19/2019   Syncope and collapse    Hypertension    Hyperlipidemia    Glaucoma, both eyes    GERD (gastroesophageal reflux disease)    Fibromyalgia    Depression    Bulging lumbar disc    Borderline diabetes    Arthritis    AAA (abdominal aortic aneurysm) (HCC)    Primary osteoarthritis of left knee 07/18/2019   Mixed hyperlipidemia 04/18/2017   Disequilibrium    Syncope 03/21/2017   Dizziness    Fall    Hemoptysis 03/18/2015   Solitary pulmonary nodule 03/18/2015   Nuclear cataract of both eyes 01/01/2014   Past Medical History:  Diagnosis Date   AAA (abdominal aortic aneurysm) (HCC)    ACS (acute coronary syndrome) (HCC) 08/01/2022   Arthritis    "knees" (03/21/2017)   Borderline diabetes    Bulging lumbar disc    CAD (coronary artery disease) 08/17/2022   Depression    Diabetes mellitus due to underlying condition with unspecified complications (HCC) 12/19/2019   Disequilibrium    Dizziness    Essential hypertension 12/19/2019   Fall    Fibromyalgia    GERD (gastroesophageal reflux disease)    Glaucoma, both eyes    Hemoptysis 03/18/2015   Hyperlipidemia    "don't have it but I take  RX" (03/21/2017)   Hypertension    Mixed dyslipidemia 12/19/2019   Mixed hyperlipidemia 04/18/2017   Nuclear cataract of both eyes 01/01/2014   Plantar fasciitis 10/21/2020   Posterior tibial tendon dysfunction (PTTD) of right lower extremity 11/16/2021   Pre-diabetes    pt states she is not diabetic. Last A1C was normal. Pt is on no medications for DM   Preoperative cardiovascular examination 12/19/2019   Primary osteoarthritis of left foot 10/21/2020   Primary osteoarthritis of left knee 07/18/2019   Solitary pulmonary nodule 03/18/2015   03/2015 6mm RLL nodule    Spinal stenosis of lumbar region with neurogenic claudication 04/26/2021    Spinal stenosis, lumbar region with neurogenic claudication 04/26/2021   Central stenosis, L2-3 and L3-4 with moderate foramenal narrowing L4-5   Status post AAA (abdominal aortic aneurysm) repair 12/19/2019   Status post total left knee replacement 03/27/2020   STEMI (ST elevation myocardial infarction) (HCC) 08/01/2022   STEMI involving right coronary artery (HCC) 07/29/2022   Syncope 03/21/2017   Syncope and collapse     Family History  Problem Relation Age of Onset   Hypertension Father    Stomach cancer Father    Glaucoma Mother    Diabetes Mother    Diabetes Sister    Diabetes Brother    Lupus Child    Diabetes Brother    Lung cancer Brother    Colon cancer Brother     Past Surgical History:  Procedure Laterality Date   ABDOMINAL AORTIC ANEURYSM REPAIR  1966   APPENDECTOMY  1966   CARDIAC CATHETERIZATION     20+ years ago, no abnormalities found   COLONOSCOPY     CORONARY/GRAFT ACUTE MI REVASCULARIZATION N/A 07/29/2022   Procedure: Coronary/Graft Acute MI Revascularization;  Surgeon: Lennette Bihari, MD;  [facility]: [facility];  Service: Cardiovascular;  Laterality: N/A;   LEFT HEART CATH AND CORONARY ANGIOGRAPHY N/A 07/29/2022   Procedure: LEFT HEART CATH AND CORONARY ANGIOGRAPHY;  Surgeon: Lennette Bihari, MD;  [facility]: [facility];  Service: Cardiovascular;  Laterality: N/A;   LUMBAR LAMINECTOMY/DECOMPRESSION MICRODISCECTOMY N/A 04/26/2021   Procedure: CENTRAL LAMINECTOMIES LUMBAR TWO-THREE,  LUMBAR THREE-FOUR WITH BILATERAL FORAMINOTOMIES;  Surgeon: Kerrin Champagne, MD;  Location: [facility];  Service: Orthopedics;  Laterality: N/A;   TOTAL KNEE ARTHROPLASTY Left 03/27/2020   Procedure: LEFT TOTAL KNEE ARTHROPLASTY;  Surgeon: Tarry Kos, MD;  Location: [facility];  Service: Orthopedics;  Laterality: Left;   VAGINAL HYSTERECTOMY     "partial"   VIDEO BRONCHOSCOPY Bilateral 03/19/2015   Procedure: VIDEO BRONCHOSCOPY WITHOUT FLUORO;  Surgeon: Oretha Milch, MD;   Location: [facility];  Service: Cardiopulmonary;  Laterality: Bilateral;   Social History   Occupational History   Occupation: retired    Comment: Designer, fashion/clothing; dietician  Tobacco Use   Smoking status: Never   Smokeless tobacco: Never  Vaping Use   Vaping status: Never Used  Substance and Sexual Activity   Alcohol use: No   Drug use: No   Sexual activity: Not on file

## 2023-03-31 DIAGNOSIS — M8580 Other specified disorders of bone density and structure, unspecified site: Secondary | ICD-10-CM | POA: Diagnosis not present

## 2023-03-31 DIAGNOSIS — I10 Essential (primary) hypertension: Secondary | ICD-10-CM | POA: Diagnosis not present

## 2023-03-31 DIAGNOSIS — R7303 Prediabetes: Secondary | ICD-10-CM | POA: Diagnosis not present

## 2023-03-31 DIAGNOSIS — E785 Hyperlipidemia, unspecified: Secondary | ICD-10-CM | POA: Diagnosis not present

## 2023-03-31 DIAGNOSIS — J309 Allergic rhinitis, unspecified: Secondary | ICD-10-CM | POA: Diagnosis not present

## 2023-03-31 DIAGNOSIS — F325 Major depressive disorder, single episode, in full remission: Secondary | ICD-10-CM | POA: Diagnosis not present

## 2023-03-31 DIAGNOSIS — F411 Generalized anxiety disorder: Secondary | ICD-10-CM | POA: Diagnosis not present

## 2023-03-31 DIAGNOSIS — I700 Atherosclerosis of aorta: Secondary | ICD-10-CM | POA: Diagnosis not present

## 2023-03-31 DIAGNOSIS — I252 Old myocardial infarction: Secondary | ICD-10-CM | POA: Diagnosis not present

## 2023-03-31 DIAGNOSIS — M1990 Unspecified osteoarthritis, unspecified site: Secondary | ICD-10-CM | POA: Diagnosis not present

## 2023-03-31 DIAGNOSIS — Z79899 Other long term (current) drug therapy: Secondary | ICD-10-CM | POA: Diagnosis not present

## 2023-03-31 DIAGNOSIS — I2510 Atherosclerotic heart disease of native coronary artery without angina pectoris: Secondary | ICD-10-CM | POA: Diagnosis not present

## 2023-05-08 ENCOUNTER — Other Ambulatory Visit: Payer: Self-pay | Admitting: Cardiology

## 2023-05-18 DIAGNOSIS — R195 Other fecal abnormalities: Secondary | ICD-10-CM | POA: Diagnosis not present

## 2023-05-24 DIAGNOSIS — L570 Actinic keratosis: Secondary | ICD-10-CM | POA: Diagnosis not present

## 2023-07-04 DIAGNOSIS — H2513 Age-related nuclear cataract, bilateral: Secondary | ICD-10-CM | POA: Diagnosis not present

## 2023-07-04 DIAGNOSIS — H401131 Primary open-angle glaucoma, bilateral, mild stage: Secondary | ICD-10-CM | POA: Diagnosis not present

## 2023-07-07 DIAGNOSIS — R197 Diarrhea, unspecified: Secondary | ICD-10-CM | POA: Diagnosis not present

## 2023-07-07 DIAGNOSIS — E785 Hyperlipidemia, unspecified: Secondary | ICD-10-CM | POA: Diagnosis not present

## 2023-07-07 DIAGNOSIS — I700 Atherosclerosis of aorta: Secondary | ICD-10-CM | POA: Diagnosis not present

## 2023-07-07 DIAGNOSIS — F325 Major depressive disorder, single episode, in full remission: Secondary | ICD-10-CM | POA: Diagnosis not present

## 2023-07-07 DIAGNOSIS — I10 Essential (primary) hypertension: Secondary | ICD-10-CM | POA: Diagnosis not present

## 2023-07-07 DIAGNOSIS — R7303 Prediabetes: Secondary | ICD-10-CM | POA: Diagnosis not present

## 2023-07-07 DIAGNOSIS — Z79899 Other long term (current) drug therapy: Secondary | ICD-10-CM | POA: Diagnosis not present

## 2023-07-07 DIAGNOSIS — I252 Old myocardial infarction: Secondary | ICD-10-CM | POA: Diagnosis not present

## 2023-07-07 DIAGNOSIS — M1990 Unspecified osteoarthritis, unspecified site: Secondary | ICD-10-CM | POA: Diagnosis not present

## 2023-07-07 DIAGNOSIS — M8580 Other specified disorders of bone density and structure, unspecified site: Secondary | ICD-10-CM | POA: Diagnosis not present

## 2023-07-07 DIAGNOSIS — I2510 Atherosclerotic heart disease of native coronary artery without angina pectoris: Secondary | ICD-10-CM | POA: Diagnosis not present

## 2023-07-07 DIAGNOSIS — F411 Generalized anxiety disorder: Secondary | ICD-10-CM | POA: Diagnosis not present

## 2023-07-21 ENCOUNTER — Other Ambulatory Visit: Payer: Self-pay | Admitting: Cardiology

## 2023-08-03 ENCOUNTER — Inpatient Hospital Stay (HOSPITAL_COMMUNITY)

## 2023-08-03 ENCOUNTER — Emergency Department (HOSPITAL_COMMUNITY)

## 2023-08-03 ENCOUNTER — Encounter (HOSPITAL_COMMUNITY): Payer: Self-pay

## 2023-08-03 ENCOUNTER — Other Ambulatory Visit: Payer: Self-pay

## 2023-08-03 ENCOUNTER — Inpatient Hospital Stay (HOSPITAL_COMMUNITY)
Admission: EM | Admit: 2023-08-03 | Discharge: 2023-08-05 | DRG: 065 | Disposition: A | Discharge status: Home Health | Attending: Internal Medicine | Admitting: Internal Medicine

## 2023-08-03 DIAGNOSIS — I700 Atherosclerosis of aorta: Secondary | ICD-10-CM | POA: Diagnosis present

## 2023-08-03 DIAGNOSIS — Z800 Family history of malignant neoplasm of digestive organs: Secondary | ICD-10-CM

## 2023-08-03 DIAGNOSIS — M797 Fibromyalgia: Secondary | ICD-10-CM | POA: Diagnosis not present

## 2023-08-03 DIAGNOSIS — Z7982 Long term (current) use of aspirin: Secondary | ICD-10-CM

## 2023-08-03 DIAGNOSIS — F32A Depression, unspecified: Secondary | ICD-10-CM | POA: Diagnosis present

## 2023-08-03 DIAGNOSIS — G8191 Hemiplegia, unspecified affecting right dominant side: Secondary | ICD-10-CM | POA: Diagnosis not present

## 2023-08-03 DIAGNOSIS — Z833 Family history of diabetes mellitus: Secondary | ICD-10-CM

## 2023-08-03 DIAGNOSIS — R4701 Aphasia: Secondary | ICD-10-CM | POA: Diagnosis present

## 2023-08-03 DIAGNOSIS — Z888 Allergy status to other drugs, medicaments and biological substances status: Secondary | ICD-10-CM | POA: Diagnosis not present

## 2023-08-03 DIAGNOSIS — I6782 Cerebral ischemia: Secondary | ICD-10-CM | POA: Diagnosis not present

## 2023-08-03 DIAGNOSIS — R531 Weakness: Secondary | ICD-10-CM | POA: Diagnosis not present

## 2023-08-03 DIAGNOSIS — Z801 Family history of malignant neoplasm of trachea, bronchus and lung: Secondary | ICD-10-CM | POA: Diagnosis not present

## 2023-08-03 DIAGNOSIS — E782 Mixed hyperlipidemia: Secondary | ICD-10-CM | POA: Diagnosis not present

## 2023-08-03 DIAGNOSIS — R29704 NIHSS score 4: Secondary | ICD-10-CM | POA: Diagnosis present

## 2023-08-03 DIAGNOSIS — I6521 Occlusion and stenosis of right carotid artery: Secondary | ICD-10-CM | POA: Diagnosis present

## 2023-08-03 DIAGNOSIS — H539 Unspecified visual disturbance: Secondary | ICD-10-CM | POA: Diagnosis not present

## 2023-08-03 DIAGNOSIS — Z8673 Personal history of transient ischemic attack (TIA), and cerebral infarction without residual deficits: Secondary | ICD-10-CM | POA: Diagnosis not present

## 2023-08-03 DIAGNOSIS — Z6831 Body mass index (BMI) 31.0-31.9, adult: Secondary | ICD-10-CM

## 2023-08-03 DIAGNOSIS — Z8249 Family history of ischemic heart disease and other diseases of the circulatory system: Secondary | ICD-10-CM

## 2023-08-03 DIAGNOSIS — I10 Essential (primary) hypertension: Secondary | ICD-10-CM | POA: Diagnosis present

## 2023-08-03 DIAGNOSIS — Z96652 Presence of left artificial knee joint: Secondary | ICD-10-CM | POA: Diagnosis not present

## 2023-08-03 DIAGNOSIS — Z90710 Acquired absence of both cervix and uterus: Secondary | ICD-10-CM

## 2023-08-03 DIAGNOSIS — E119 Type 2 diabetes mellitus without complications: Secondary | ICD-10-CM | POA: Diagnosis present

## 2023-08-03 DIAGNOSIS — R569 Unspecified convulsions: Secondary | ICD-10-CM | POA: Diagnosis not present

## 2023-08-03 DIAGNOSIS — I639 Cerebral infarction, unspecified: Secondary | ICD-10-CM | POA: Diagnosis not present

## 2023-08-03 DIAGNOSIS — G459 Transient cerebral ischemic attack, unspecified: Secondary | ICD-10-CM | POA: Diagnosis present

## 2023-08-03 DIAGNOSIS — R471 Dysarthria and anarthria: Principal | ICD-10-CM | POA: Diagnosis present

## 2023-08-03 DIAGNOSIS — M6281 Muscle weakness (generalized): Secondary | ICD-10-CM | POA: Diagnosis not present

## 2023-08-03 DIAGNOSIS — Z7902 Long term (current) use of antithrombotics/antiplatelets: Secondary | ICD-10-CM

## 2023-08-03 DIAGNOSIS — K219 Gastro-esophageal reflux disease without esophagitis: Secondary | ICD-10-CM | POA: Diagnosis present

## 2023-08-03 DIAGNOSIS — I252 Old myocardial infarction: Secondary | ICD-10-CM

## 2023-08-03 DIAGNOSIS — Z79899 Other long term (current) drug therapy: Secondary | ICD-10-CM

## 2023-08-03 DIAGNOSIS — I6389 Other cerebral infarction: Secondary | ICD-10-CM | POA: Diagnosis not present

## 2023-08-03 DIAGNOSIS — Z8679 Personal history of other diseases of the circulatory system: Secondary | ICD-10-CM

## 2023-08-03 DIAGNOSIS — R9089 Other abnormal findings on diagnostic imaging of central nervous system: Secondary | ICD-10-CM | POA: Diagnosis not present

## 2023-08-03 DIAGNOSIS — I2510 Atherosclerotic heart disease of native coronary artery without angina pectoris: Secondary | ICD-10-CM | POA: Diagnosis present

## 2023-08-03 DIAGNOSIS — I6523 Occlusion and stenosis of bilateral carotid arteries: Secondary | ICD-10-CM | POA: Diagnosis not present

## 2023-08-03 DIAGNOSIS — E669 Obesity, unspecified: Secondary | ICD-10-CM | POA: Diagnosis present

## 2023-08-03 HISTORY — DX: Transient cerebral ischemic attack, unspecified: G45.9

## 2023-08-03 LAB — COMPREHENSIVE METABOLIC PANEL WITH GFR
ALT: 21 U/L (ref 0–44)
AST: 27 U/L (ref 15–41)
Albumin: 3 g/dL — ABNORMAL LOW (ref 3.5–5.0)
Alkaline Phosphatase: 59 U/L (ref 38–126)
Anion gap: 10 (ref 5–15)
BUN: 20 mg/dL (ref 8–23)
CO2: 21 mmol/L — ABNORMAL LOW (ref 22–32)
Calcium: 8.7 mg/dL — ABNORMAL LOW (ref 8.9–10.3)
Chloride: 108 mmol/L (ref 98–111)
Creatinine, Ser: 0.98 mg/dL (ref 0.44–1.00)
GFR, Estimated: 57 mL/min — ABNORMAL LOW (ref >60)
Glucose, Bld: 131 mg/dL — ABNORMAL HIGH (ref 70–99)
Potassium: 3.4 mmol/L — ABNORMAL LOW (ref 3.5–5.1)
Sodium: 139 mmol/L (ref 135–145)
Total Bilirubin: 1.3 mg/dL — ABNORMAL HIGH (ref 0.0–1.2)
Total Protein: 6.3 g/dL — ABNORMAL LOW (ref 6.5–8.1)

## 2023-08-03 LAB — RAPID URINE DRUG SCREEN, HOSP PERFORMED
Amphetamines: NOT DETECTED
Barbiturates: NOT DETECTED
Benzodiazepines: NOT DETECTED
Cocaine: NOT DETECTED
Opiates: NOT DETECTED
Tetrahydrocannabinol: NOT DETECTED

## 2023-08-03 LAB — DIFFERENTIAL
Abs Immature Granulocytes: 0.02 10*3/uL (ref 0.00–0.07)
Basophils Absolute: 0 10*3/uL (ref 0.0–0.1)
Basophils Relative: 1 %
Eosinophils Absolute: 0.2 10*3/uL (ref 0.0–0.5)
Eosinophils Relative: 4 %
Immature Granulocytes: 0 %
Lymphocytes Relative: 31 %
Lymphs Abs: 1.7 10*3/uL (ref 0.7–4.0)
Monocytes Absolute: 0.6 10*3/uL (ref 0.1–1.0)
Monocytes Relative: 11 %
Neutro Abs: 2.8 10*3/uL (ref 1.7–7.7)
Neutrophils Relative %: 53 %

## 2023-08-03 LAB — APTT: aPTT: 25 s (ref 24–36)

## 2023-08-03 LAB — CBC
HCT: 36.5 % (ref 36.0–46.0)
Hemoglobin: 12.6 g/dL (ref 12.0–15.0)
MCH: 35.8 pg — ABNORMAL HIGH (ref 26.0–34.0)
MCHC: 34.5 g/dL (ref 30.0–36.0)
MCV: 103.7 fL — ABNORMAL HIGH (ref 80.0–100.0)
Platelets: 201 10*3/uL (ref 150–400)
RBC: 3.52 MIL/uL — ABNORMAL LOW (ref 3.87–5.11)
RDW: 12.9 % (ref 11.5–15.5)
WBC: 5.4 10*3/uL (ref 4.0–10.5)
nRBC: 0 % (ref 0.0–0.2)

## 2023-08-03 LAB — I-STAT CHEM 8, ED
BUN: 20 mg/dL (ref 8–23)
Calcium, Ion: 1.14 mmol/L — ABNORMAL LOW (ref 1.15–1.40)
Chloride: 108 mmol/L (ref 98–111)
Creatinine, Ser: 1.1 mg/dL — ABNORMAL HIGH (ref 0.44–1.00)
Glucose, Bld: 133 mg/dL — ABNORMAL HIGH (ref 70–99)
HCT: 37 % (ref 36.0–46.0)
Hemoglobin: 12.6 g/dL (ref 12.0–15.0)
Potassium: 3.4 mmol/L — ABNORMAL LOW (ref 3.5–5.1)
Sodium: 141 mmol/L (ref 135–145)
TCO2: 20 mmol/L — ABNORMAL LOW (ref 22–32)

## 2023-08-03 LAB — PROTIME-INR
INR: 1.1 (ref 0.8–1.2)
Prothrombin Time: 14.7 s (ref 11.4–15.2)

## 2023-08-03 LAB — ETHANOL: Alcohol, Ethyl (B): <15 mg/dL (ref <15)

## 2023-08-03 MED ORDER — HEPARIN SODIUM (PORCINE) 5000 UNIT/ML IJ SOLN
5000.0000 [IU] | Freq: Three times a day (TID) | INTRAMUSCULAR | Status: DC
  Administered 2023-08-04 – 2023-08-05 (×4): 5000 [IU] via SUBCUTANEOUS
  Filled 2023-08-03 (×4): qty 1

## 2023-08-03 MED ORDER — ASPIRIN 81 MG PO CHEW: 324.0000 mg | CHEWABLE_TABLET | Freq: Once | ORAL | Status: DC

## 2023-08-03 MED ORDER — ACETAMINOPHEN 650 MG RE SUPP: 650.0000 mg | RECTAL | Status: DC | PRN

## 2023-08-03 MED ORDER — IOHEXOL 350 MG/ML SOLN
75.0000 mL | Freq: Once | INTRAVENOUS | Status: AC | PRN
  Administered 2023-08-03: 75 mL via INTRAVENOUS

## 2023-08-03 MED ORDER — ACETAMINOPHEN ER 650 MG PO TBCR: 650.0000 mg | EXTENDED_RELEASE_TABLET | Freq: Two times a day (BID) | ORAL | Status: DC

## 2023-08-03 MED ORDER — PANTOPRAZOLE SODIUM 40 MG PO TBEC
40.0000 mg | DELAYED_RELEASE_TABLET | Freq: Every day | ORAL | Status: DC
  Administered 2023-08-04 – 2023-08-05 (×2): 40 mg via ORAL
  Filled 2023-08-03 (×2): qty 1

## 2023-08-03 MED ORDER — ACETAMINOPHEN 325 MG PO TABS
650.0000 mg | ORAL_TABLET | ORAL | Status: DC | PRN
  Administered 2023-08-04: 650 mg via ORAL
  Filled 2023-08-03: qty 2

## 2023-08-03 MED ORDER — CLOPIDOGREL BISULFATE 75 MG PO TABS
75.0000 mg | ORAL_TABLET | Freq: Every day | ORAL | Status: DC
  Administered 2023-08-04 – 2023-08-05 (×2): 75 mg via ORAL
  Filled 2023-08-03 (×2): qty 1

## 2023-08-03 MED ORDER — ASPIRIN 81 MG PO TBEC
81.0000 mg | DELAYED_RELEASE_TABLET | Freq: Every day | ORAL | Status: DC
  Administered 2023-08-04 (×2): 81 mg via ORAL
  Filled 2023-08-03 (×2): qty 1

## 2023-08-03 MED ORDER — SODIUM CHLORIDE 0.9 % IV SOLN
INTRAVENOUS | Status: AC
Start: 2023-08-03 — End: 2023-08-04

## 2023-08-03 MED ORDER — ATORVASTATIN CALCIUM 80 MG PO TABS
80.0000 mg | ORAL_TABLET | Freq: Every day | ORAL | Status: DC
  Administered 2023-08-04 – 2023-08-05 (×2): 80 mg via ORAL
  Filled 2023-08-03: qty 1
  Filled 2023-08-03: qty 2

## 2023-08-03 MED ORDER — ACETAMINOPHEN 160 MG/5ML PO SOLN: 650.0000 mg | ORAL | Status: DC | PRN

## 2023-08-03 MED ORDER — VENLAFAXINE HCL ER 75 MG PO CP24
150.0000 mg | ORAL_CAPSULE | Freq: Every day | ORAL | Status: DC
  Administered 2023-08-04 – 2023-08-05 (×2): 150 mg via ORAL
  Filled 2023-08-03 (×2): qty 2

## 2023-08-03 MED ORDER — SENNOSIDES-DOCUSATE SODIUM 8.6-50 MG PO TABS: 1.0000 | ORAL_TABLET | Freq: Every evening | ORAL | Status: DC | PRN

## 2023-08-03 MED ORDER — CLOPIDOGREL BISULFATE 300 MG PO TABS
300.0000 mg | ORAL_TABLET | Freq: Once | ORAL | Status: AC
  Administered 2023-08-04: 300 mg via ORAL
  Filled 2023-08-03: qty 1

## 2023-08-03 MED ORDER — STROKE: EARLY STAGES OF RECOVERY BOOK: Freq: Once | Status: DC

## 2023-08-03 NOTE — ED Notes (Signed)
 Pt arrived husband reports that she went to bed last night and was feeling ok.  She woke up this morning and was unable to speak.  Pt. Placed in wheelchair and transferred to Triage 3. Pt. Is alert and sitting up in wheelchair.

## 2023-08-03 NOTE — ED Triage Notes (Signed)
 Patient went to bed around 930 and woke up this morning 0700 with slurred speech.  No other neuro deficits.

## 2023-08-03 NOTE — ED Provider Notes (Signed)
 Patient signed out to me at 1530 by Dr. Monnie Anthony pending CTA.  In short this is an 83 year old female with a past medical history of hypertension, hyperlipidemia, diabetes, AAA status post repair, CAD the presented to the emergency department with aphasia.  Patient's last known well was 9:30 PM last night and woke up around 7 AM this morning with word finding difficulty.  She also reported having intermittent double vision.  Patient's labs here are within normal range.  Shortly after going to CT, the nurse notified me that the patient was complaining of new right sided weakness and numbness.  On my reevaluation, the patient's pupils are equal and reactive bilaterally, no cranial nerve deficits, she does have subjectively decreased sensation in the right arm and right leg with mild drift in right arm and right leg, normal finger-to-nose bilaterally, still aphasic.  CT read is pending at this time.  Will page neurology have high suspicion for stroke and likely will require admission for further treatment.  6:44 PM I spoke with Dr. Doretta Gant. Without LVO on CT would not be thrombectomy candidate. Recommend admission to hospitalist for stroke work up and will see in consult.   Kingsley, Ivar Domangue K, DO 08/03/23 [phone]

## 2023-08-03 NOTE — ED Notes (Signed)
 Patient transported to MRI

## 2023-08-03 NOTE — ED Notes (Signed)
 CCMD called and notified

## 2023-08-03 NOTE — ED Provider Triage Note (Signed)
 Emergency Medicine Provider Triage Evaluation Note  Cassandra Brooks , a 83 y.o. female  was evaluated in triage.  Pt complains of difficulty speaking.  Review of Systems   Physical Exam  BP (!) 180/70 (BP Location: Right Arm)   Pulse 66   Temp 98 F (36.7 C)   Resp 18   SpO2 98%  Eye movements intact.  No visual field deficits.  Does have some mild expressive aphasia.  Able to get some words out but is thinking more about it.  Able to move all extremities.  Medical Decision Making  Medically screening exam initiated at 11:12 AM.  Appropriate orders placed.  Cassandra Brooks was informed that the remainder of the evaluation will be completed by another provider, this initial triage assessment does not replace that evaluation, and the importance of remaining in the ED until their evaluation is complete.  Patient with difficulty speaking.  Last normal was 9 PM last night woke up at 7 AM this morning with difficulty speaking.  Has improved some but still having episodes.  Not LVO positive.   Mozell Arias, MD 08/03/23 1113

## 2023-08-03 NOTE — ED Provider Notes (Signed)
 [place] EMERGENCY DEPARTMENT AT [facility] Provider Note   CSN: [id number] Arrival date & time: 08/03/23  1056     History  Chief Complaint  Patient presents with   Aphasia         Cassandra Brooks is a 83 y.o. female.  HPI   Pt went to bed last night with no issues.  This am when she woke up pt had difficulty speaking.  NO trouble with weakness in her arms or legs.  Pt has had some episodes of vision changes.  Possible double or triple.   Home Medications Prior to Admission medications   Medication Sig Start Date End Date Taking? Authorizing Provider  acetaminophen  (TYLENOL ) 650 MG CR tablet Take 650 mg by mouth 2 (two) times daily.   Yes [provider]  amLODipine  (NORVASC ) 5 MG tablet Take 1 tablet (5 mg total) by mouth daily. 05/09/23  Yes Revankar, Micael Adas, MD  amoxicillin  (AMOXIL ) 500 MG tablet take 4 pills one hour prior to dental work 05/17/21  Yes Sandie Cross, PA-C  aspirin  EC 81 MG tablet Take 81 mg by mouth at bedtime.   Yes [provider]  atorvastatin  (LIPITOR) 80 MG tablet Take 1 tablet (80 mg total) by mouth daily. 08/02/22  Yes Johnie Nailer B, NP  diclofenac  Sodium (VOLTAREN ) 1 % GEL Apply 2 g topically 4 (four) times daily. Patient taking differently: Apply 2 g topically 4 (four) times daily as needed (pain). 10/19/21  Yes Sandie Cross, PA-C  dorzolamide -timolol  (COSOPT ) 22.3-6.8 MG/ML ophthalmic solution Place 1 drop into both eyes 2 (two) times daily.   Yes [provider]  fluticasone  (FLONASE ) 50 MCG/ACT nasal spray Place 2 sprays into both nostrils daily. 11/08/19  Yes [provider]  hydrochlorothiazide  (HYDRODIURIL ) 12.5 MG tablet Take 12.5 mg by mouth daily. 09/15/22  Yes [provider]  LUMIGAN 0.01 % SOLN Place 1 drop into both eyes at bedtime. 12/12/19  Yes [provider]  metoprolol  tartrate (LOPRESSOR ) 25 MG tablet Take 1 tablet (25 mg total) by mouth 2 (two) times daily.  11/10/22  Yes Revankar, Micael Adas, MD  Multiple Vitamins-Minerals (MULTIVITAMIN WOMEN 50+) TABS Take 1 tablet by mouth daily.   Yes [provider]  nabumetone  (RELAFEN ) 750 MG tablet Take 750 mg by mouth 2 (two) times daily. 02/21/17  Yes [provider]  nitroGLYCERIN  (NITROSTAT ) 0.4 MG SL tablet Place 0.4 mg under the tongue every 5 (five) minutes as needed for chest pain.   Yes [provider]  omeprazole (PRILOSEC) 20 MG capsule Take 20 mg by mouth daily.   Yes [provider]  valsartan  (DIOVAN ) 160 MG tablet Take 1 tablet (160 mg total) by mouth daily. Patient taking differently: Take 160 mg by mouth at bedtime. 08/11/22  Yes Terrance Ferretti, NP  venlafaxine  XR (EFFEXOR -XR) 150 MG 24 hr capsule Take 1 capsule (150 mg total) by mouth daily with breakfast. 03/28/17  Yes Barrie Borer, DO      Allergies    Zestril  Bastien.Bolster ]    Review of Systems   Review of Systems  Physical Exam Updated Vital Signs BP (!) 147/61   Pulse 62   Temp 97.8 F (36.6 C) (Oral)   Resp 10   Ht 1.549 m (5\' 1" )   Wt 75.8 kg   SpO2 94%   BMI 31.55 kg/m  Physical Exam Vitals and nursing note reviewed.  Constitutional:      General: She is not in  acute distress.    Appearance: She is well-developed. She is not diaphoretic.  HENT:     Head: Normocephalic and atraumatic.     Right Ear: External ear normal.     Left Ear: External ear normal.  Eyes:     General: No visual field deficit or scleral icterus.       Right eye: No discharge.        Left eye: No discharge.     Conjunctiva/sclera: Conjunctivae normal.  Neck:     Trachea: No tracheal deviation.  Cardiovascular:     Rate and Rhythm: Normal rate and regular rhythm.  Pulmonary:     Effort: Pulmonary effort is normal. No respiratory distress.     Breath sounds: Normal breath sounds. No stridor. No wheezing or rales.  Abdominal:     General: Bowel sounds are normal. There is no distension.     Palpations:  Abdomen is soft.     Tenderness: There is no abdominal tenderness. There is no guarding or rebound.  Musculoskeletal:        General: No tenderness or deformity.     Cervical back: Neck supple.  Skin:    General: Skin is warm and dry.     Findings: No rash.  Neurological:     General: No focal deficit present.     Mental Status: She is alert and oriented to person, place, and time.     Cranial Nerves: Dysarthria present. No cranial nerve deficit or facial asymmetry.     Sensory: No sensory deficit.     Motor: No abnormal muscle tone, seizure activity or pronator drift.     Coordination: Coordination normal.     Comments:  able to hold both legs off bed for 5 seconds, sensation intact in all extremities,  no left or right sided neglect, normal finger-nose exam bilaterally, no nystagmus noted, difficulty with wood finding   Psychiatric:        Mood and Affect: Mood normal.     ED Results / Procedures / Treatments   Labs (all labs ordered are listed, but only abnormal results are displayed) Labs Reviewed  CBC - Abnormal; Notable for the following components:      Result Value   RBC 3.52 (*)    MCV 103.7 (*)    MCH 35.8 (*)    All other components within normal limits  COMPREHENSIVE METABOLIC PANEL WITH GFR - Abnormal; Notable for the following components:   Potassium 3.4 (*)    CO2 21 (*)    Glucose, Bld 131 (*)    Calcium  8.7 (*)    Total Protein 6.3 (*)    Albumin 3.0 (*)    Total Bilirubin 1.3 (*)    GFR, Estimated 57 (*)    All other components within normal limits  I-STAT CHEM 8, ED - Abnormal; Notable for the following components:   Potassium 3.4 (*)    Creatinine, Ser 1.10 (*)    Glucose, Bld 133 (*)    Calcium , Ion 1.14 (*)    TCO2 20 (*)    All other components within normal limits  ETHANOL  PROTIME-INR  APTT  DIFFERENTIAL  RAPID URINE DRUG SCREEN, HOSP PERFORMED    EKG EKG Interpretation Date/Time:  Thursday August 03 2023 11:05:33 EDT Ventricular  Rate:  65 PR Interval:  162 QRS Duration:  66 QT Interval:  406 QTC Calculation: 422 R Axis:   41  Text Interpretation: Normal sinus rhythm Nonspecific T wave abnormality Abnormal  ECG When compared with ECG of 03-Aug-2022 06:41, PREVIOUS ECG IS PRESENT No significant change since last tracing Confirmed by Trish Furl [phone]) on 08/03/2023 12:21:32 PM  Radiology No results found.  Procedures Procedures    Medications Ordered in ED Medications - No data to display  ED Course/ Medical Decision Making/ A&P Clinical Course as of 08/03/23 1511  Thu Aug 03, 2023  1328 Labs reviewed.  Metabolic panel shows mild decrease in potassium bilirubin slightly elevated doubt clinically significant.  CBC unremarkable. [JK]    Clinical Course User Index [JK] Trish Furl, MD                                 Medical Decision Making Problems Addressed: Aphasia: acute illness or injury that poses a threat to life or bodily functions Dysarthria: acute illness or injury that poses a threat to life or bodily functions  Amount and/or Complexity of Data Reviewed Labs: ordered. Decision-making details documented in ED Course.  Risk Decision regarding hospitalization.   Patient presented to the ED for evaluation of acute speech difficulty.  Presentation concerning for the possibility of TIA stroke.  On exam patient noted to have some difficulty with her speech although outside of acute tPA window.  She is not having other focal weakness or deficits.  Initial laboratory tests are unremarkable.  CT angiogram of head and neck ordered by triage provider.  Currently pending.  Care turned over at shift change.  Anticipate need for admission further workup        Final Clinical Impression(s) / ED Diagnoses Final diagnoses:  Dysarthria  Aphasia    Rx / DC Orders ED Discharge Orders     None         Trish Furl, MD 08/03/23 1511

## 2023-08-03 NOTE — H&P (Signed)
 History and Physical    Cassandra Brooks XLK:[id number] DOB: 09-23-1940 DOA: 08/03/2023  PCP: Olga Berthold, NP  Patient coming from: home  I have personally briefly reviewed patient's old medical records in [facility]  Chief Complaint: aphasia / unilateral upper ext weakness  HPI: Cassandra Brooks is a 83 y.o. female with medical history significant of  DMII, AAA, CAD s/p MI, HTN , GERD, fibromyalgia HLD, Depression who presents to ED with complaint of aphasia that was noted around 7:30 this am with last know well of 9:30 the night prior. Patient course further complicated with onset of right sided numbness and weakness of upper and lower extremities. Currently patient still has difficulty with word finding and also decrease strength in right upper and lower extremities. Patient notes no change in vision no difficulty swallowing, no n/v//d /sob or chest pain.   ED Course:  Afeb, BP 180/70, HR66, rr 18, sat 98%  EKG:NSR  nonspecific twave changes Wbc 5.4, hgb 12.6, MCV 103.7, plt 201  Na 139, K 3.4, CL 108, bicarb 21, cr 0.98 ETOH<15 UDS neg  CTA/CTH IMPRESSION: No large vessel occlusion.   Multifocal atherosclerosis as above. Focal moderate stenosis of the right supraclinoid ICA.   No CT evidence of acute intracranial abnormality.   Moderate chronic microvascular ischemic changes and parenchymal volume loss. Redemonstrated remote infarcts in the inferior right cerebellum and left internal capsule.   Heterogeneous pulmonary opacities in the bilateral upper lobes. Recommend correlation with dedicated chest imaging.   Aortic Atherosclerosis (ICD10-I70.0).   MRI IMPRESSION: 1. Small foci of acute ischemia within the left frontal white matter. No hemorrhage or mass effect. 2. 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 Review of Systems: As per HPI otherwise 10 point review of systems negative.   Past Medical History:   Diagnosis Date   AAA (abdominal aortic aneurysm) (HCC)    ACS (acute coronary syndrome) (HCC) 08/01/2022   Arthritis    "knees" (03/21/2017)   Borderline diabetes    Bulging lumbar disc    CAD (coronary artery disease) 08/17/2022   Depression    Diabetes mellitus due to underlying condition with unspecified complications (HCC) 12/19/2019   Disequilibrium    Dizziness    Essential hypertension 12/19/2019   Fall    Fibromyalgia    GERD (gastroesophageal reflux disease)    Glaucoma, both eyes    Hemoptysis 03/18/2015   Hyperlipidemia    "don't have it but I take RX" (03/21/2017)   Hypertension    Mixed dyslipidemia 12/19/2019   Mixed hyperlipidemia 04/18/2017   Nuclear cataract of both eyes 01/01/2014   Plantar fasciitis 10/21/2020   Posterior tibial tendon dysfunction (PTTD) of right lower extremity 11/16/2021   Pre-diabetes    pt states she is not diabetic. Last A1C was normal. Pt is on no medications for DM   Preoperative cardiovascular examination 12/19/2019   Primary osteoarthritis of left foot 10/21/2020   Primary osteoarthritis of left knee 07/18/2019   Solitary pulmonary nodule 03/18/2015   03/2015 6mm RLL nodule    Spinal stenosis of lumbar region with neurogenic claudication 04/26/2021   Spinal stenosis, lumbar region with neurogenic claudication 04/26/2021   Central stenosis, L2-3 and L3-4 with moderate foramenal narrowing L4-5   Status post AAA (abdominal aortic aneurysm) repair 12/19/2019   Status post total left knee replacement 03/27/2020   STEMI (ST elevation myocardial infarction) (HCC) 08/01/2022   STEMI involving right coronary artery (HCC) 07/29/2022   Syncope  03/21/2017   Syncope and collapse     Past Surgical History:  Procedure Laterality Date   ABDOMINAL AORTIC ANEURYSM REPAIR  1966   APPENDECTOMY  1966   CARDIAC CATHETERIZATION     20+ years ago, no abnormalities found   COLONOSCOPY     CORONARY/GRAFT ACUTE MI REVASCULARIZATION N/A 07/29/2022    Procedure: Coronary/Graft Acute MI Revascularization;  Surgeon: Millicent Ally, MD;  Location: [facility];  Service: Cardiovascular;  Laterality: N/A;   LEFT HEART CATH AND CORONARY ANGIOGRAPHY N/A 07/29/2022   Procedure: LEFT HEART CATH AND CORONARY ANGIOGRAPHY;  Surgeon: Millicent Ally, MD;  Location: [facility];  Service: Cardiovascular;  Laterality: N/A;   LUMBAR LAMINECTOMY/DECOMPRESSION MICRODISCECTOMY N/A 04/26/2021   Procedure: CENTRAL LAMINECTOMIES LUMBAR TWO-THREE,  LUMBAR THREE-FOUR WITH BILATERAL FORAMINOTOMIES;  Surgeon: Alphonso Jean, MD;  Location: [facility] OR;  Service: Orthopedics;  Laterality: N/A;   TOTAL KNEE ARTHROPLASTY Left 03/27/2020   Procedure: LEFT TOTAL KNEE ARTHROPLASTY;  Surgeon: Wes Hamman, MD;  Location: [facility] OR;  Service: Orthopedics;  Laterality: Left;   VAGINAL HYSTERECTOMY     "partial"   VIDEO BRONCHOSCOPY Bilateral 03/19/2015   Procedure: VIDEO BRONCHOSCOPY WITHOUT FLUORO;  Surgeon: Lind Repine, MD;  Location: [facility];  Service: Cardiopulmonary;  Laterality: Bilateral;     reports that she has never smoked. She has never used smokeless tobacco. She reports that she does not drink alcohol and does not use drugs.  Allergies  Allergen Reactions   Zestril  [Lisinopril ] Cough    Family History  Problem Relation Age of Onset   Hypertension Father    Stomach cancer Father    Glaucoma Mother    Diabetes Mother    Diabetes Sister    Diabetes Brother    Lupus Child    Diabetes Brother    Lung cancer Brother    Colon cancer Brother     Prior to Admission medications   Medication Sig Start Date End Date Taking? Authorizing Provider  acetaminophen  (TYLENOL ) 650 MG CR tablet Take 650 mg by mouth 2 (two) times daily.   Yes [provider]  amLODipine  (NORVASC ) 5 MG tablet Take 1 tablet (5 mg total) by mouth daily. 05/09/23  Yes Revankar, Micael Adas, MD  amoxicillin  (AMOXIL ) 500 MG tablet take 4 pills one hour prior to dental work  05/17/21  Yes Sandie Cross, PA-C  aspirin  EC 81 MG tablet Take 81 mg by mouth at bedtime.   Yes [provider]  atorvastatin  (LIPITOR) 80 MG tablet Take 1 tablet (80 mg total) by mouth daily. 08/02/22  Yes Sanjuanita Cruz, NP  diclofenac  Sodium (VOLTAREN ) 1 % GEL Apply 2 g topically 4 (four) times daily. Patient taking differently: Apply 2 g topically 4 (four) times daily as needed (pain). 10/19/21  Yes Sandie Cross, PA-C  dorzolamide -timolol  (COSOPT ) 22.3-6.8 MG/ML ophthalmic solution Place 1 drop into both eyes 2 (two) times daily.   Yes [provider]  fluticasone  (FLONASE ) 50 MCG/ACT nasal spray Place 2 sprays into both nostrils daily. 11/08/19  Yes [provider]  hydrochlorothiazide  (HYDRODIURIL ) 12.5 MG tablet Take 12.5 mg by mouth daily. 09/15/22  Yes [provider]  LUMIGAN 0.01 % SOLN Place 1 drop into both eyes at bedtime. 12/12/19  Yes [provider]  metoprolol  tartrate (LOPRESSOR ) 25 MG tablet Take 1 tablet (25 mg total) by mouth 2 (two) times daily. 11/10/22  Yes Revankar, Micael Adas, MD  Multiple Vitamins-Minerals (MULTIVITAMIN WOMEN  50+) TABS Take 1 tablet by mouth daily.   Yes [provider]  nabumetone  (RELAFEN ) 750 MG tablet Take 750 mg by mouth 2 (two) times daily. 02/21/17  Yes [provider]  nitroGLYCERIN  (NITROSTAT ) 0.4 MG SL tablet Place 0.4 mg under the tongue every 5 (five) minutes as needed for chest pain.   Yes [provider]  omeprazole (PRILOSEC) 20 MG capsule Take 20 mg by mouth daily.   Yes [provider]  valsartan  (DIOVAN ) 160 MG tablet Take 1 tablet (160 mg total) by mouth daily. Patient taking differently: Take 160 mg by mouth at bedtime. 08/11/22  Yes Terrance Ferretti, NP  venlafaxine  XR (EFFEXOR -XR) 150 MG 24 hr capsule Take 1 capsule (150 mg total) by mouth daily with breakfast. 03/28/17  Yes Barrie Borer, DO    Physical Exam: Vitals:   08/03/23 1315 08/03/23 1630  08/03/23 1731 08/03/23 1930  BP: (!) 147/61 (!) 168/63  (!) 181/53  Pulse: 62 73  (!) 56  Resp: 10 (!) 21  (!) 24  Temp:   97.6 F (36.4 C)   TempSrc:   Oral   SpO2: 94% 98%  97%  Weight:      Height:        Constitutional: NAD, calm, comfortable Vitals:   08/03/23 1315 08/03/23 1630 08/03/23 1731 08/03/23 1930  BP: (!) 147/61 (!) 168/63  (!) 181/53  Pulse: 62 73  (!) 56  Resp: 10 (!) 21  (!) 24  Temp:   97.6 F (36.4 C)   TempSrc:   Oral   SpO2: 94% 98%  97%  Weight:      Height:       Eyes: PERRL, lids and conjunctivae normal ENMT: Mucous membranes are moist. Posterior pharynx clear of any exudate or lesions.Normal dentition.  Neck: normal, supple, no masses, no thyromegaly Respiratory: clear to auscultation bilaterally, no wheezing, no crackles. Normal respiratory effort. No accessory muscle use.  Cardiovascular: Regular rate and rhythm, no murmurs / rubs / gallops. No extremity edema. 2+ pedal pulses.  Abdomen: no tenderness, no masses palpated. No hepatosplenomegaly. Bowel sounds positive.  Musculoskeletal: no clubbing / cyanosis. No joint deformity upper and lower extremities. Good ROM, no contractures. Normal muscle tone.  Skin: no rashes, lesions, ulcers. No induration Neurologic: cn2-12 intact notes symmetric smile.  Sensation 4+ RUE, 4_ RLE, 5/5 Left upper and lower extremity intact Psychiatric: Normal judgment and insight. Alert and oriented x 3. Normal mood.    Labs on Admission: I have personally reviewed following labs and imaging studies  CBC: Recent Labs  Lab 08/03/23 1125 08/03/23 1129  WBC 5.4  --   NEUTROABS 2.8  --   HGB 12.6 12.6  HCT 36.5 37.0  MCV 103.7*  --   PLT 201  --    Basic Metabolic Panel: Recent Labs  Lab 08/03/23 1125 08/03/23 1129  NA 139 141  K 3.4* 3.4*  CL 108 108  CO2 21*  --   GLUCOSE 131* 133*  BUN 20 20  CREATININE 0.98 1.10*  CALCIUM  8.7*  --    GFR: Estimated Creatinine Clearance: 36.1 mL/min (A) (by C-G  formula based on SCr of 1.1 mg/dL (H)). Liver Function Tests: Recent Labs  Lab 08/03/23 1125  AST 27  ALT 21  ALKPHOS 59  BILITOT 1.3*  PROT 6.3*  ALBUMIN 3.0*   No results for input(s): "LIPASE", "AMYLASE" in the last 168 hours. No results for input(s): "AMMONIA" in the last 168 hours. Coagulation  Profile: Recent Labs  Lab 08/03/23 1125  INR 1.1   Cardiac Enzymes: No results for input(s): "CKTOTAL", "CKMB", "CKMBINDEX", "TROPONINI" in the last 168 hours. BNP (last 3 results) No results for input(s): "PROBNP" in the last 8760 hours. HbA1C: No results for input(s): "HGBA1C" in the last 72 hours. CBG: No results for input(s): "GLUCAP" in the last 168 hours. Lipid Profile: No results for input(s): "CHOL", "HDL", "LDLCALC", "TRIG", "CHOLHDL", "LDLDIRECT" in the last 72 hours. Thyroid  Function Tests: No results for input(s): "TSH", "T4TOTAL", "FREET4", "T3FREE", "THYROIDAB" in the last 72 hours. Anemia Panel: No results for input(s): "VITAMINB12", "FOLATE", "FERRITIN", "TIBC", "IRON", "RETICCTPCT" in the last 72 hours. Urine analysis:    Component Value Date/Time   COLORURINE YELLOW 04/22/2021 0853   APPEARANCEUR CLEAR 04/22/2021 0853   LABSPEC >1.030 (H) 04/22/2021 0853   PHURINE 5.0 04/22/2021 0853   GLUCOSEU NEGATIVE 04/22/2021 0853   HGBUR NEGATIVE 04/22/2021 0853   BILIRUBINUR NEGATIVE 04/22/2021 0853   KETONESUR NEGATIVE 04/22/2021 0853   PROTEINUR NEGATIVE 04/22/2021 0853   NITRITE NEGATIVE 04/22/2021 0853   LEUKOCYTESUR NEGATIVE 04/22/2021 0853    Radiological Exams on Admission: CT ANGIO HEAD NECK W WO CM Result Date: 08/03/2023 CLINICAL DATA:  Neuro deficit, concern for stroke. Difficulty speaking upon waking this morning. Vision changes. EXAM: CT ANGIOGRAPHY HEAD AND NECK WITH AND WITHOUT CONTRAST TECHNIQUE: Multidetector CT imaging of the head and neck was performed using the standard protocol during bolus administration of intravenous contrast. Multiplanar  CT image reconstructions and MIPs were obtained to evaluate the vascular anatomy. Carotid stenosis measurements (when applicable) are obtained utilizing NASCET criteria, using the distal internal carotid diameter as the denominator. RADIATION DOSE REDUCTION: This exam was performed according to the departmental dose-optimization program which includes automated exposure control, adjustment of the mA and/or kV according to patient size and/or use of iterative reconstruction technique. CONTRAST:  75mL OMNIPAQUE  IOHEXOL  350 MG/ML SOLN COMPARISON:  CT head 07/31/2022 FINDINGS: CT HEAD FINDINGS Brain: No acute intracranial hemorrhage. No CT evidence of acute infarct. Nonspecific hypoattenuation in the periventricular and subcortical white matter favored to reflect chronic microvascular ischemic changes. Redemonstrated small remote infarct in the inferior right cerebellum. Additional remote infarct involving the anterior limb of the left internal capsule. No edema, mass effect, or midline shift. The basilar cisterns are patent. Ventricles: Prominence of the ventricles suggestive of underlying parenchymal volume loss. Vascular: No hyperdense vessel. Intracranial atherosclerotic calcifications noted. Skull: No acute or aggressive finding. Sinuses/orbits: The visualized paranasal sinuses are clear. Orbits are symmetric. Other: Mastoid air cells are clear. CTA NECK FINDINGS Aortic arch: Standard configuration of the aortic arch. Imaged portion shows no evidence of aneurysm or dissection. Mild atherosclerosis of the aortic arch. No significant stenosis of the major arch vessel origins. Pulmonary arteries: As permitted by contrast timing, there are no filling defects in the visualized pulmonary arteries. Subclavian arteries: Patent bilaterally. Atherosclerosis at the origin of the left subclavian artery and along the proximal right subclavian artery without significant stenosis. Right carotid system: Patent. Mild  atherosclerosis at the carotid bifurcation without hemodynamically significant stenosis. No evidence of dissection. Left carotid system: Patent. Mild atherosclerosis at the carotid bifurcation without hemodynamically significant stenosis. No evidence of dissection. Vertebral arteries: Left vertebral artery is dominant. No evidence of dissection, stenosis (50% or greater), or occlusion. Tortuosity of the left V1 segment. Skeleton: No acute findings. Degenerative changes in the cervical spine. Disc space narrowing most pronounced at C5-6 and C6-7. Degenerative changes of the mandibular condyles. Other neck:  The visualized airway is patent. No cervical lymphadenopathy. Upper chest: Heterogeneous opacities in the bilateral upper lobes. Review of the MIP images confirms the above findings CTA HEAD FINDINGS ANTERIOR CIRCULATION: The intracranial ICAs are patent bilaterally. Atherosclerosis of the bilateral carotid siphons. Focal moderate stenosis of the right supraclinoid ICA. No severe stenosis, proximal occlusion, aneurysm, or vascular malformation. MCAs: The middle cerebral arteries are patent bilaterally. ACAs: The anterior cerebral arteries are patent bilaterally. POSTERIOR CIRCULATION: No significant stenosis, proximal occlusion, aneurysm, or vascular malformation. PCAs: The posterior cerebral arteries are patent bilaterally. Pcomm: Not well visualized. SCAs: The superior cerebellar arteries are patent bilaterally. Basilar artery: Patent AICAs: Visualized on the right. PICAs: Visualized on the left. Vertebral arteries: The intracranial vertebral arteries are patent. Venous sinuses: As permitted by contrast timing, patent. Anatomic variants: None Review of the MIP images confirms the above findings IMPRESSION: No large vessel occlusion. Multifocal atherosclerosis as above. Focal moderate stenosis of the right supraclinoid ICA. No CT evidence of acute intracranial abnormality. Moderate chronic microvascular ischemic  changes and parenchymal volume loss. Redemonstrated remote infarcts in the inferior right cerebellum and left internal capsule. Heterogeneous pulmonary opacities in the bilateral upper lobes. Recommend correlation with dedicated chest imaging. Aortic Atherosclerosis (ICD10-I70.0). Electronically Signed   By: Denny Flack M.D.   On: 08/03/2023 17:26    EKG: Independently reviewed. See above   Assessment/Plan  Acute CVA  -aphasia , unilateral upper extremity weakness(R) - CT head negative for acute finding   - case discussed with neurology on call per ED MD  -admit tia/cva r/o  -mri : Small foci of acute ischemia within the left frontal white matter. -asa,plavix  ,statin -neuro checks , SLP, PT/OT  -echo /carotids  per protocol  -continue with permissive hypertension from 24-48 or until MRI neg    DMII -iss/fs   AAA -no acute issues    CAD s/p MI -resume home regimen as able 24-48 hour of permissive HTN   HTN -holding oral medications for permissive HTN -resume as able    GERD -ppi   Fibromyalgia -resume home regimen    HLD -continue on statin    Depression -resume ssri   DVT prophylaxis: heparin  Code Status: full/ as discussed per patient wishes in event of cardiac arrest  Family Communication: DTR [name] Scotton  [phone] Disposition Plan: patient  expected to be admitted greater than 2 midnights  Consults called: neurology Dr Veleta Gerold Admission status: progressive   Sabas Cradle MD [facility]   If 7PM-7AM, please contact night-coverage [email or web address] Password [email or web address]  08/03/2023, 8:11 PM

## 2023-08-04 ENCOUNTER — Inpatient Hospital Stay (HOSPITAL_COMMUNITY)

## 2023-08-04 ENCOUNTER — Ambulatory Visit (HOSPITAL_COMMUNITY)

## 2023-08-04 DIAGNOSIS — R471 Dysarthria and anarthria: Secondary | ICD-10-CM | POA: Diagnosis not present

## 2023-08-04 DIAGNOSIS — I10 Essential (primary) hypertension: Secondary | ICD-10-CM

## 2023-08-04 DIAGNOSIS — G459 Transient cerebral ischemic attack, unspecified: Secondary | ICD-10-CM

## 2023-08-04 DIAGNOSIS — R4701 Aphasia: Secondary | ICD-10-CM | POA: Diagnosis not present

## 2023-08-04 DIAGNOSIS — R569 Unspecified convulsions: Secondary | ICD-10-CM | POA: Diagnosis not present

## 2023-08-04 DIAGNOSIS — R531 Weakness: Secondary | ICD-10-CM

## 2023-08-04 LAB — LIPID PANEL
Cholesterol: 97 mg/dL (ref 0–200)
HDL: 31 mg/dL — ABNORMAL LOW (ref >40)
LDL Cholesterol: 36 mg/dL (ref 0–99)
Total CHOL/HDL Ratio: 3.1 ratio
Triglycerides: 152 mg/dL — ABNORMAL HIGH (ref <150)
VLDL: 30 mg/dL (ref 0–40)

## 2023-08-04 LAB — CBC
HCT: 37 % (ref 36.0–46.0)
Hemoglobin: 12.5 g/dL (ref 12.0–15.0)
MCH: 34.8 pg — ABNORMAL HIGH (ref 26.0–34.0)
MCHC: 33.8 g/dL (ref 30.0–36.0)
MCV: 103.1 fL — ABNORMAL HIGH (ref 80.0–100.0)
Platelets: 163 10*3/uL (ref 150–400)
RBC: 3.59 MIL/uL — ABNORMAL LOW (ref 3.87–5.11)
RDW: 12.9 % (ref 11.5–15.5)
WBC: 5.3 10*3/uL (ref 4.0–10.5)
nRBC: 0 % (ref 0.0–0.2)

## 2023-08-04 LAB — HEMOGLOBIN A1C
Hgb A1c MFr Bld: 6.1 % — ABNORMAL HIGH (ref 4.8–5.6)
Mean Plasma Glucose: 128.37 mg/dL

## 2023-08-04 LAB — ECHOCARDIOGRAM COMPLETE
AR max vel: 1.95 cm2
AV Peak grad: 9.6 mmHg
Ao pk vel: 1.55 m/s
Area-P 1/2: 4.06 cm2
Height: 61 in
MV VTI: 2.72 cm2
S' Lateral: 2.8 cm
Weight: 2672 [oz_av]

## 2023-08-04 LAB — CREATININE, SERUM
Creatinine, Ser: 0.86 mg/dL (ref 0.44–1.00)
GFR, Estimated: >60 mL/min (ref >60)

## 2023-08-04 MED ORDER — DORZOLAMIDE HCL-TIMOLOL MAL 2-0.5 % OP SOLN
1.0000 [drp] | Freq: Two times a day (BID) | OPHTHALMIC | Status: DC
  Administered 2023-08-04 (×2): 1 [drp] via OPHTHALMIC
  Filled 2023-08-04: qty 10

## 2023-08-04 NOTE — ED Notes (Signed)
 Pt states IV came out while in MRI earlier - new IV restarted at this time

## 2023-08-04 NOTE — Progress Notes (Signed)
   08/04/23 1614  TOC Brief Assessment  Insurance and Status Reviewed (Medicare A and B)  Patient has primary care physician Yes (Moon, Amy A, NP)  Home environment has been reviewed From Home with Husband  Prior level of function: independent  Prior/Current Home Services No current home services (Had [facility] in 2021)  Social Drivers of Health Review SDOH reviewed no interventions necessary  Readmission risk has been reviewed Yes (9%)  Transition of care needs no transition of care needs at this time   Following for [facility] needs

## 2023-08-04 NOTE — Plan of Care (Signed)

## 2023-08-04 NOTE — ED Notes (Addendum)
 Cassandra Brooks

## 2023-08-04 NOTE — Progress Notes (Signed)
 Patient has bed upstairs. EEG to be placed once she is moved.

## 2023-08-04 NOTE — Evaluation (Signed)
 Speech Language Pathology Evaluation Patient Details Name: Cassandra Brooks MRN: [id number] DOB: [birth year] Today's Date: 08/04/2023 Time: 4098-1191 SLP Time Calculation (min) (ACUTE ONLY): 34 min  Problem List:  Patient Active Problem List   Diagnosis Date Noted   TIA (transient ischemic attack) 08/03/2023   Obesity (BMI 30.0-34.9) 09/29/2022   CAD (coronary artery disease) 08/17/2022   ACS (acute coronary syndrome) (HCC) 08/01/2022   STEMI (ST elevation myocardial infarction) (HCC) 08/01/2022   STEMI involving right coronary artery (HCC) 07/29/2022   Posterior tibial tendon dysfunction (PTTD) of right lower extremity 11/16/2021   Spinal stenosis, lumbar region with neurogenic claudication 04/26/2021   Spinal stenosis of lumbar region with neurogenic claudication 04/26/2021   Primary osteoarthritis of left foot 10/21/2020   Plantar fasciitis 10/21/2020   Pre-diabetes    Status post total left knee replacement 03/27/2020   Essential hypertension 12/19/2019   Mixed dyslipidemia 12/19/2019   Diabetes mellitus due to underlying condition with unspecified complications (HCC) 12/19/2019   Preoperative cardiovascular examination 12/19/2019   Status post AAA (abdominal aortic aneurysm) repair 12/19/2019   Syncope and collapse    Hypertension    Hyperlipidemia    Glaucoma, both eyes    GERD (gastroesophageal reflux disease)    Fibromyalgia    Depression    Bulging lumbar disc    Borderline diabetes    Arthritis    AAA (abdominal aortic aneurysm) (HCC)    Primary osteoarthritis of left knee 07/18/2019   Mixed hyperlipidemia 04/18/2017   Disequilibrium    Syncope 03/21/2017   Dizziness    Fall    Hemoptysis 03/18/2015   Solitary pulmonary nodule 03/18/2015   Nuclear cataract of both eyes 01/01/2014   Past Medical History:  Past Medical History:  Diagnosis Date   AAA (abdominal aortic aneurysm) (HCC)    ACS (acute coronary syndrome) (HCC) 08/01/2022   Arthritis    "knees"  (03/21/2017)   Borderline diabetes    Bulging lumbar disc    CAD (coronary artery disease) 08/17/2022   Depression    Diabetes mellitus due to underlying condition with unspecified complications (HCC) 12/19/2019   Disequilibrium    Dizziness    Essential hypertension 12/19/2019   Fall    Fibromyalgia    GERD (gastroesophageal reflux disease)    Glaucoma, both eyes    Hemoptysis 03/18/2015   Hyperlipidemia    "don't have it but I take RX" (03/21/2017)   Hypertension    Mixed dyslipidemia 12/19/2019   Mixed hyperlipidemia 04/18/2017   Nuclear cataract of both eyes 01/01/2014   Plantar fasciitis 10/21/2020   Posterior tibial tendon dysfunction (PTTD) of right lower extremity 11/16/2021   Pre-diabetes    pt states she is not diabetic. Last A1C was normal. Pt is on no medications for DM   Preoperative cardiovascular examination 12/19/2019   Primary osteoarthritis of left foot 10/21/2020   Primary osteoarthritis of left knee 07/18/2019   Solitary pulmonary nodule 03/18/2015   03/2015 6mm RLL nodule    Spinal stenosis of lumbar region with neurogenic claudication 04/26/2021   Spinal stenosis, lumbar region with neurogenic claudication 04/26/2021   Central stenosis, L2-3 and L3-4 with moderate foramenal narrowing L4-5   Status post AAA (abdominal aortic aneurysm) repair 12/19/2019   Status post total left knee replacement 03/27/2020   STEMI (ST elevation myocardial infarction) (HCC) 08/01/2022   STEMI involving right coronary artery (HCC) 07/29/2022   Syncope 03/21/2017   Syncope and collapse    Past Surgical History:  Past Surgical History:  Procedure  Laterality Date   ABDOMINAL AORTIC ANEURYSM REPAIR  1966   APPENDECTOMY  1966   CARDIAC CATHETERIZATION     20+ years ago, no abnormalities found   COLONOSCOPY     CORONARY/GRAFT ACUTE MI REVASCULARIZATION N/A 07/29/2022   Procedure: Coronary/Graft Acute MI Revascularization;  Surgeon: Millicent Ally, MD;  Location[facility] INVASIVE  CV LAB;  Service: Cardiovascular;  Laterality: N/A;   LEFT HEART CATH AND CORONARY ANGIOGRAPHY N/A 07/29/2022   Procedure: LEFT HEART CATH AND CORONARY ANGIOGRAPHY;  Surgeon: Millicent Ally, MD;  Location: [facility] INVASIVE CV LAB;  Service: Cardiovascular;  Laterality: N/A;   LUMBAR LAMINECTOMY/DECOMPRESSION MICRODISCECTOMY N/A 04/26/2021   Procedure: CENTRAL LAMINECTOMIES LUMBAR TWO-THREE,  LUMBAR THREE-FOUR WITH BILATERAL FORAMINOTOMIES;  Surgeon: Alphonso Jean, MD;  Location: [facility] OR;  Service: Orthopedics;  Laterality: N/A;   TOTAL KNEE ARTHROPLASTY Left 03/27/2020   Procedure: LEFT TOTAL KNEE ARTHROPLASTY;  Surgeon: Wes Hamman, MD;  Location: [facility] OR;  Service: Orthopedics;  Laterality: Left;   VAGINAL HYSTERECTOMY     "partial"   VIDEO BRONCHOSCOPY Bilateral 03/19/2015   Procedure: VIDEO BRONCHOSCOPY WITHOUT FLUORO;  Surgeon: Lind Repine, MD;  Location: [facility] ENDOSCOPY;  Service: Cardiopulmonary;  Laterality: Bilateral;   HPI:  Cassandra Brooks is a 83 y.o. female who presented to ED with complaint of aphasia. MRI 4/24: "1. Small foci of acute ischemia within the left frontal white  matter. No hemorrhage or mass effect.  2. 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 Pt with baseline cognitive deficits 2/2 post-polio syndrome per pt/husband report   Assessment / Plan / Recommendation Clinical Impression  Pt presents with an expressive language impairment which is seemingly consistent with a Broca's type aphasia.  There was seemingly more difficulty with repetition than spontaneous speech which may suggest a conduction aphasia.  Pt was assessed using portions of the Western Aphsia Battery bedside form. Pt's spontaneous speech was effortful with some short sentences and some agrammatic phrases, but with appropriate content  She addressed all of the  most salient items in picture description task.  Pt answered y/n questions with 100% accuracy. Pt performed WNL on confrontational naming task.  She demonstrated ability to utilize circumlocution for single missed target referring to index finger as the "one next to the thumb."  She exhibited difficulty with repetition of increasing length, but was able to write a dictated sentance without difficulty. Letters were well formed. There was difficulty spelling one word in spontaneous sentence writing.  Written sentences were grammatic, but short and simple ("I woke up. I couldn't talk.")  Pt has baseline deficits per husband following post-polio syndrome.  She does not manage medications, pay bills, or write letters. Husband reports some hesitancy with speech and forgetting what she wants to say at baseline, but this is resolved with restarting utterance in most cases. Pt's speech is clear and without dysarthria.  Verbal expression has improved since admission, but today's presentation marks a significant change in her expressive language from baseline.  Pt would benefit from speech therapy while in house and at next level of care, either as OP if discharging home, or as part of rehab stay.  If pt is a candidate for IPR, she would likely be an excellent speech therapy participant for CIR.  Western Aphasia Battery - Bedside Form Content: 9/10 Fluency: 4/10 Yes/No: 10/10 Sequential Commands: not  assessed Repetition: 4.5/10 Naming: 9.5/10      SLP Assessment  SLP Recommendation/Assessment: Patient needs continued Speech Lanaguage Pathology Services SLP Visit Diagnosis: Aphasia (R47.01)    Recommendations for follow up therapy are one component of a multi-disciplinary discharge planning process, led by the attending physician.  Recommendations may be updated based on patient status, additional functional criteria and insurance authorization.    Follow Up Recommendations  Acute inpatient rehab (3hours/day)  (If pt is a candidate; otherwise, ST at next level of care)    Assistance Recommended at Discharge  Intermittent Supervision/Assistance  Functional Status Assessment Patient has had a recent decline in their functional status and demonstrates the ability to make significant improvements in function in a reasonable and predictable amount of time.  Frequency and Duration min 2x/week  2 weeks      SLP Evaluation Cognition  Overall Cognitive Status: Difficult to assess (uanble to assess 2/2 langauge impairment, hx of deficits at baseline)       Comprehension  Auditory Comprehension Overall Auditory Comprehension: Appears within functional limits for tasks assessed Yes/No Questions: Within Functional Limits Conversation: Simple Visual Recognition/Discrimination Discrimination: Not tested Reading Comprehension Reading Status: Not tested    Expression Expression Primary Mode of Expression: Verbal Verbal Expression Overall Verbal Expression: Impaired Level of Generative/Spontaneous Verbalization: Sentence Repetition: Impaired Level of Impairment: Sentence level;Phrase level Naming: No impairment Pragmatics: No impairment Written Expression Dominant Hand: Right Written Expression: Within Functional Limits   Oral / Motor  Motor Speech Overall Motor Speech: Appears within functional limits for tasks assessed Respiration: Within functional limits Phonation: Normal Resonance: Within functional limits Articulation: Within functional limitis Intelligibility: Intelligible            Elester Grim, MA, CCC-SLP Acute Rehabilitation Services Office: [phone] 08/04/2023, 12:07 PM

## 2023-08-04 NOTE — Procedures (Signed)
 Patient Name: Cassandra Brooks  MRN: [id number]  Epilepsy Attending: Arleene Lack  Referring Physician/Provider: Khaliqdina, Salman, MD  Date: 08/04/2023 Duration: 23.47 mins  Patient history: 83 yo F with dysarthria and aphasia along with right-sided weakness. EEG to evaluate for seizure  Level of alertness: Awake  AEDs during EEG study: None  Technical aspects: This EEG study was done with scalp electrodes positioned according to the 10-20 International system of electrode placement. Electrical activity was reviewed with band pass filter of 1-70Hz , sensitivity of 7 uV/mm, display speed of 24mm/sec with a 60Hz  notched filter applied as appropriate. EEG data were recorded continuously and digitally stored.  Video monitoring was available and reviewed as appropriate.  Description: The posterior dominant rhythm consists of 7.5 Hz activity of moderate voltage (25-35 uV) seen predominantly in posterior head regions, symmetric and reactive to eye opening and eye closing. EEG showed intermittent rhythmic 3-4 Hz theta-delta slowing in left temporal region. Hyperventilation and photic stimulation were not performed.     ABNORMALITY - Intermittent slow, left temporal region.  IMPRESSION: This study is suggestive of non specific cortical dysfunction arising from left temporal region. No seizures or epileptiform discharges were seen throughout the recording.  Nyiesha Beever O Sequoyah Ramone

## 2023-08-04 NOTE — Consult Note (Signed)
 NEUROLOGY CONSULT NOTE   Date of service: August 04, 2023 Patient Name: Cassandra Brooks MRN:  [id number] DOB:  May 26, 1940 Chief Complaint: "aphasia, R sided weakness" Requesting Provider: Sabas Cradle, MD  History of Present Illness  Cassandra Brooks is a 83 y.o. female with hx of DM2, HTN, HLD, GERD, STEMI, glaucoma, who woke up with slurred speech and aphasia. Went to bed around 930 on 08/02/23, she woke up around 3 AM to go to the bathroom and had no difficulty with walking to the bathroom.  She then woke up at 0700 on 08/03/23 with slurred speech, aphasia.  Around 3 in the afternoon, she developed some right-sided weakness.  Family brought her to the ED where she had a CT head without contrast which was negative for any acute intracranial abnormalities.  She had a CT angio of the head and neck which was negative for any large vessel occlusion.  She does have multifocal atherosclerosis with moderate focal stenosis of the right supraclinoid ICA.  She had an MRI of the brain which demonstrated a 6 mm left frontal white matter stroke.    LKW: 0300 on 08/03/2023. Modified rankin score: 1-No significant post stroke disability and can perform usual duties with stroke symptoms IV Thrombolysis: Not offered, she is outside of window on presentation.   EVT: Not offered, she has no LVO   NIHSS components Score: Comment  1a Level of Conscious 0[x]  1[]  2[]  3[]      1b LOC Questions 0[x]  1[]  2[]       1c LOC Commands 0[x]  1[]  2[]       2 Best Gaze 0[x]  1[]  2[]       3 Visual 0[x]  1[]  2[]  3[]      4 Facial Palsy 0[x]  1[]  2[]  3[]      5a Motor Arm - left 0[x]  1[]  2[]  3[]  4[]  UN[]    5b Motor Arm - Right 0[x]  1[]  2[]  3[]  4[]  UN[]    6a Motor Leg - Left 0[x]  1[]  2[]  3[]  4[]  UN[]    6b Motor Leg - Right 0[]  1[x]  2[]  3[]  4[]  UN[]    7 Limb Ataxia 0[x]  1[]  2[]  3[]  UN[]     8 Sensory 0[]  1[x]  2[]  UN[]      9 Best Language 0[]  1[]  2[x]  3[]      10 Dysarthria 0[x]  1[]  2[]  UN[]      11 Extinct. and Inattention  0[x]  1[]  2[]       TOTAL: 4      ROS  Unable to ascertain due to significant aphasia.  Past History   Past Medical History:  Diagnosis Date   AAA (abdominal aortic aneurysm) (HCC)    ACS (acute coronary syndrome) (HCC) 08/01/2022   Arthritis    "knees" (03/21/2017)   Borderline diabetes    Bulging lumbar disc    CAD (coronary artery disease) 08/17/2022   Depression    Diabetes mellitus due to underlying condition with unspecified complications (HCC) 12/19/2019   Disequilibrium    Dizziness    Essential hypertension 12/19/2019   Fall    Fibromyalgia    GERD (gastroesophageal reflux disease)    Glaucoma, both eyes    Hemoptysis 03/18/2015   Hyperlipidemia    "don't have it but I take RX" (03/21/2017)   Hypertension    Mixed dyslipidemia 12/19/2019   Mixed hyperlipidemia 04/18/2017   Nuclear cataract of both eyes 01/01/2014   Plantar fasciitis 10/21/2020   Posterior tibial tendon dysfunction (PTTD) of right lower extremity 11/16/2021   Pre-diabetes    pt states she is not  diabetic. Last A1C was normal. Pt is on no medications for DM   Preoperative cardiovascular examination 12/19/2019   Primary osteoarthritis of left foot 10/21/2020   Primary osteoarthritis of left knee 07/18/2019   Solitary pulmonary nodule 03/18/2015   03/2015 6mm RLL nodule    Spinal stenosis of lumbar region with neurogenic claudication 04/26/2021   Spinal stenosis, lumbar region with neurogenic claudication 04/26/2021   Central stenosis, L2-3 and L3-4 with moderate foramenal narrowing L4-5   Status post AAA (abdominal aortic aneurysm) repair 12/19/2019   Status post total left knee replacement 03/27/2020   STEMI (ST elevation myocardial infarction) (HCC) 08/01/2022   STEMI involving right coronary artery (HCC) 07/29/2022   Syncope 03/21/2017   Syncope and collapse     Past Surgical History:  Procedure Laterality Date   ABDOMINAL AORTIC ANEURYSM REPAIR  1966   APPENDECTOMY  1966   CARDIAC  CATHETERIZATION     20+ years ago, no abnormalities found   COLONOSCOPY     CORONARY/GRAFT ACUTE MI REVASCULARIZATION N/A 07/29/2022   Procedure: Coronary/Graft Acute MI Revascularization;  Surgeon: Millicent Ally, MD;  Location: [facility];  Service: Cardiovascular;  Laterality: N/A;   LEFT HEART CATH AND CORONARY ANGIOGRAPHY N/A 07/29/2022   Procedure: LEFT HEART CATH AND CORONARY ANGIOGRAPHY;  Surgeon: Millicent Ally, MD;  Location: [facility];  Service: Cardiovascular;  Laterality: N/A;   LUMBAR LAMINECTOMY/DECOMPRESSION MICRODISCECTOMY N/A 04/26/2021   Procedure: CENTRAL LAMINECTOMIES LUMBAR TWO-THREE,  LUMBAR THREE-FOUR WITH BILATERAL FORAMINOTOMIES;  Surgeon: Alphonso Jean, MD;  Location: [facility] OR;  Service: Orthopedics;  Laterality: N/A;   TOTAL KNEE ARTHROPLASTY Left 03/27/2020   Procedure: LEFT TOTAL KNEE ARTHROPLASTY;  Surgeon: Wes Hamman, MD;  Location: [facility] OR;  Service: Orthopedics;  Laterality: Left;   VAGINAL HYSTERECTOMY     "partial"   VIDEO BRONCHOSCOPY Bilateral 03/19/2015   Procedure: VIDEO BRONCHOSCOPY WITHOUT FLUORO;  Surgeon: Lind Repine, MD;  Location: [facility];  Service: Cardiopulmonary;  Laterality: Bilateral;    Family History: Family History  Problem Relation Age of Onset   Hypertension Father    Stomach cancer Father    Glaucoma Mother    Diabetes Mother    Diabetes Sister    Diabetes Brother    Lupus Child    Diabetes Brother    Lung cancer Brother    Colon cancer Brother     Social History  reports that she has never smoked. She has never used smokeless tobacco. She reports that she does not drink alcohol and does not use drugs.  Allergies  Allergen Reactions   Zestril  [Lisinopril ] Cough    Medications   Current Facility-Administered Medications:     stroke: early stages of recovery book, , Does not apply, Once, Sabas Cradle, MD   0.9 %  sodium chloride  infusion, , Intravenous, Continuous, Sabas Cradle,  MD   acetaminophen  (TYLENOL ) tablet 650 mg, 650 mg, Oral, Q4H PRN **OR** acetaminophen  (TYLENOL ) 160 MG/5ML solution 650 mg, 650 mg, Per Tube, Q4H PRN **OR** acetaminophen  (TYLENOL ) suppository 650 mg, 650 mg, Rectal, Q4H PRN, Sabas Cradle, MD   aspirin  chewable tablet 324 mg, 324 mg, Oral, Once, Kingsley, Victoria K, DO   aspirin  EC tablet 81 mg, 81 mg, Oral, QHS, Thomas, Sara-Maiz A, MD   atorvastatin  (LIPITOR) tablet 80 mg, 80 mg, Oral, Daily, Tera Fellows A, MD   clopidogrel  (PLAVIX ) tablet 300 mg, 300 mg, Oral, Once, Kingsley, Victoria K, DO   clopidogrel  (PLAVIX )  tablet 75 mg, 75 mg, Oral, Daily, Sabas Cradle, MD   heparin  injection 5,000 Units, 5,000 Units, Subcutaneous, Q8H, Sabas Cradle, MD   pantoprazole  (PROTONIX ) EC tablet 40 mg, 40 mg, Oral, Daily, Sabas Cradle, MD   senna-docusate (Senokot-S) tablet 1 tablet, 1 tablet, Oral, QHS PRN, Sabas Cradle, MD   venlafaxine  XR (EFFEXOR -XR) 24 hr capsule 150 mg, 150 mg, Oral, Q breakfast, Sabas Cradle, MD  Current Outpatient Medications:    acetaminophen  (TYLENOL ) 650 MG CR tablet, Take 650 mg by mouth 2 (two) times daily., Disp: , Rfl:    amLODipine  (NORVASC ) 5 MG tablet, Take 1 tablet (5 mg total) by mouth daily., Disp: 90 tablet, Rfl: 0   amoxicillin  (AMOXIL ) 500 MG tablet, take 4 pills one hour prior to dental work, Disp: 4 tablet, Rfl: 2   aspirin  EC 81 MG tablet, Take 81 mg by mouth at bedtime., Disp: , Rfl:    atorvastatin  (LIPITOR) 80 MG tablet, Take 1 tablet (80 mg total) by mouth daily., Disp: 90 tablet, Rfl: 1   diclofenac  Sodium (VOLTAREN ) 1 % GEL, Apply 2 g topically 4 (four) times daily. (Patient taking differently: Apply 2 g topically 4 (four) times daily as needed (pain).), Disp: 150 g, Rfl: 2   dorzolamide -timolol  (COSOPT ) 22.3-6.8 MG/ML ophthalmic solution, Place 1 drop into both eyes 2 (two) times daily., Disp: , Rfl:    fluticasone  (FLONASE ) 50 MCG/ACT nasal spray, Place 2  sprays into both nostrils daily., Disp: , Rfl:    hydrochlorothiazide  (HYDRODIURIL ) 12.5 MG tablet, Take 12.5 mg by mouth daily., Disp: , Rfl:    LUMIGAN 0.01 % SOLN, Place 1 drop into both eyes at bedtime., Disp: , Rfl:    metoprolol  tartrate (LOPRESSOR ) 25 MG tablet, Take 1 tablet (25 mg total) by mouth 2 (two) times daily., Disp: 180 tablet, Rfl: 3   Multiple Vitamins-Minerals (MULTIVITAMIN WOMEN 50+) TABS, Take 1 tablet by mouth daily., Disp: , Rfl:    nabumetone  (RELAFEN ) 750 MG tablet, Take 750 mg by mouth 2 (two) times daily., Disp: , Rfl: 4   nitroGLYCERIN  (NITROSTAT ) 0.4 MG SL tablet, Place 0.4 mg under the tongue every 5 (five) minutes as needed for chest pain., Disp: , Rfl:    omeprazole (PRILOSEC) 20 MG capsule, Take 20 mg by mouth daily., Disp: , Rfl:    valsartan  (DIOVAN ) 160 MG tablet, Take 1 tablet (160 mg total) by mouth daily. (Patient taking differently: Take 160 mg by mouth at bedtime.), Disp: 90 tablet, Rfl: 3   venlafaxine  XR (EFFEXOR -XR) 150 MG 24 hr capsule, Take 1 capsule (150 mg total) by mouth daily with breakfast., Disp: 30 capsule, Rfl: 0  Vitals   Vitals:   08/03/23 1930 08/03/23 2000 08/03/23 2200 08/03/23 2230  BP: (!) 181/53 (!) 182/80 (!) 156/67 (!) 154/59  Pulse: (!) 56 73 70 66  Resp: (!) 24 16 17 16   Temp:    97.7 F (36.5 C)  TempSrc:    Oral  SpO2: 97% 97% 96% 96%  Weight:      Height:        Body mass index is 31.55 kg/m.  Physical Exam   General: Laying comfortably in bed; in no acute distress.  HENT: Normal oropharynx and mucosa. Normal external appearance of ears and nose.  Neck: Supple, no pain or tenderness  CV: No JVD. No peripheral edema.  Pulmonary: Symmetric Chest rise. Normal respiratory effort.  Abdomen: Soft to touch, non-tender.  Ext: No cyanosis, edema,  or deformity  Skin: No rash. Normal palpation of skin.   Musculoskeletal: Normal digits and nails by inspection. No clubbing.   Neurologic Examination  Mental  status/Cognition: Alert, oriented to self, place, month but not year, good attention.  Speech/language: Nonfluent, gets stuck on several words.  comprehension intact, object naming intact. Cranial nerves:   CN II Pupils equal and reactive to light, no VF deficits    CN III,IV,VI EOM intact, no gaze preference or deviation, no nystagmus    CN V normal sensation in V1, V2, and V3 segments bilaterally    CN VII no asymmetry, no nasolabial fold flattening    CN VIII normal hearing to speech    CN IX & X normal palatal elevation, no uvular deviation    CN XI 5/5 head turn and 5/5 shoulder shrug bilaterally    CN XII midline tongue protrusion    Motor:  Muscle bulk: Normal, tone normal Mvmt Root Nerve  Muscle Right Left Comments  SA C5/6 Ax Deltoid 4+ 4+   EF C5/6 Mc Biceps 5 5   EE C6/7/8 Rad Triceps 5 5   WF C6/7 Med FCR     WE C7/8 PIN ECU     F Ab C8/T1 U ADM/FDI 5 5   HF L1/2/3 Fem Illopsoas 3 4+   KE L2/3/4 Fem Quad 4 5   DF L4/5 D Peron Tib Ant 5 5   PF S1/2 Tibial Grc/Sol 5 5    Sensation:  Light touch Decreased in right leg to touch.   Pin prick    Temperature    Vibration   Proprioception    Coordination/Complex Motor:  - Finger to Nose intact bilaterally - Heel to shin unable to do due to pain. - Rapid alternating movement are slow throughout, right worse than left. - Gait: Deferred for patient's safety.  Labs/Imaging/Neurodiagnostic studies   CBC:  Recent Labs  Lab 2023-09-01 1125 2023-09-01 1129  WBC 5.4  --   NEUTROABS 2.8  --   HGB 12.6 12.6  HCT 36.5 37.0  MCV 103.7*  --   PLT 201  --    Basic Metabolic Panel:  Lab Results  Component Value Date   NA 141 Sep 01, 2023   K 3.4 (L) 2023-09-01   CO2 21 (L) Sep 01, 2023   GLUCOSE 133 (H) 01-Sep-2023   BUN 20 09-01-2023   CREATININE 1.10 (H) 09-01-23   CALCIUM  8.7 (L) 09-01-2023   GFRNONAA 57 (L) 09/01/2023   GFRAA 54 (L) 08/23/2019   Lipid Panel:  Lab Results  Component Value Date   LDLCALC 46  11/23/2022   HgbA1c:  Lab Results  Component Value Date   HGBA1C 5.6 08/02/2022   Urine Drug Screen:     Component Value Date/Time   LABOPIA NONE DETECTED 09-01-23 1912   COCAINSCRNUR NONE DETECTED 09/01/23 1912   LABBENZ NONE DETECTED 09/01/2023 1912   AMPHETMU NONE DETECTED 2023-09-01 1912   THCU NONE DETECTED 09-01-2023 1912   LABBARB NONE DETECTED 2023/09/01 1912    Alcohol Level     Component Value Date/Time   ETH <15 2023-09-01 1125   INR  Lab Results  Component Value Date   INR 1.1 2023-09-01   APTT  Lab Results  Component Value Date   APTT 25 Sep 01, 2023   AED levels: No results found for: "PHENYTOIN", "ZONISAMIDE", "LAMOTRIGINE", "LEVETIRACETA"  CT Head without contrast(Personally reviewed): CTH was negative for a large hypodensity concerning for a large territory infarct or hyperdensity concerning for an ICH  CT angio Head and Neck with contrast(Personally reviewed): No large vessel occlusion.   Multifocal atherosclerosis as above. Focal moderate stenosis of the right supraclinoid ICA.   No CT evidence of acute intracranial abnormality.   Moderate chronic microvascular ischemic changes and parenchymal volume loss. Redemonstrated remote infarcts in the inferior right cerebellum and left internal capsule.   MRI Brain(Personally reviewed): 1. Small foci of acute ischemia within the left frontal white matter. No hemorrhage or mass effect. 2. 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  Neurodiagnostics rEEG:  Pending  ASSESSMENT   Cassandra Brooks is a 83 y.o. female  with hx of DM2, HTN, HLD, GERD, STEMI, glaucoma, who woke up with slurred speech and aphasia.  In the afternoon, developed some mild right-sided weakness.  CT head with no acute intracranial abnormalities.  CTA with no LVO.  MRI of the brain demonstrates a 6 mm left frontal white matter infarct.  Symptoms are way out of proportion to  the noted stroke.  Etiology of her symptoms is still not entirely clear.  In addition to stroke workup, I will obtain a routine EEG.  RECOMMENDATIONS  - Frequent Neuro checks per stroke unit protocol - Recommend obtaining TTE  - Recommend obtaining Lipid panel with LDL - Please start statin if LDL > 70 - Recommend HbA1c to evaluate for diabetes and how well it is controlled. - Antithrombotic -aspirin  81 mg daily along with Plavix  75 mg daily for 21 days, followed by aspirin  81 mg daily alone. - Recommend DVT ppx - SBP goal - permissive hypertension first 24 h < 220/110. Held home meds.  - Recommend Telemetry monitoring for arrythmia - Recommend bedside swallow screen prior to PO intake. - Stroke education booklet - Recommend PT/OT/SLP consult - routine EEG  ______________________________________________________________________    Signed, Lennie Vasco, MD [facility]

## 2023-08-04 NOTE — Progress Notes (Signed)
 PT Cancellation Note  Patient Details Name: Cassandra Brooks MRN: [id number] DOB: 03-Mar-1941   Cancelled Treatment:    Reason Eval/Treat Not Completed: Patient at procedure or test/unavailable (EEG tech  in room and stated that pt cant be moved while EEG connected.  Will return and evaluate pt in am.)   [name] 08/04/2023, 2:53 PM [name],PT Acute Rehab Services [phone]

## 2023-08-04 NOTE — Progress Notes (Signed)
 Echocardiogram 2D Echocardiogram has been performed.  Cassandra Brooks 08/04/2023, 3:49 PM

## 2023-08-04 NOTE — ED Notes (Signed)
 Speech therapy at bedside.

## 2023-08-04 NOTE — Evaluation (Signed)
 Occupational Therapy Evaluation Patient Details Name: Cassandra Brooks MRN: [id number] DOB: December 19, 1940 Today's Date: 08/04/2023   History of Present Illness   83 y.o. female presenting to [facility] on 08/04/23 with aphasia and R sided weakness. EEG showed L temporal cortical dysfunction. MRI showed ischemia in the L frontal white matter. PMH including AAA, depression, fibromyalgia, glaucoma, syncope. Pt also has a history of L knee OA, L TKA     Clinical Impressions Pt admitted for above, PTA pt was ind with ADLs and ambulating no AD, her husband would assist with medication management. Pt ambulatory in hall with CGA no AD but demonstrates a limp, per the daughter the limp is not new or related to recent hospitalization. Pt completing ADLs with CGA to setup A, Bp remains elevated with NT aware, no abnormalities in strength or vision noted. Her word finding difficulties are still presents, would be good to assess reading comprehension capacity next session. OT to continue following pt acutely to ensure pt stays at a functional level and reduce risk of decline. No post acute OT needed at DC.      If plan is discharge home, recommend the following:   Direct supervision/assist for medications management;Direct supervision/assist for financial management;Supervision due to cognitive status     Functional Status Assessment   Patient has had a recent decline in their functional status and/or demonstrates limited ability to make significant improvements in function in a reasonable and predictable amount of time     Equipment Recommendations   None recommended by OT     Recommendations for Other Services         Precautions/Restrictions   Precautions Precautions: Fall Recall of Precautions/Restrictions: Intact Restrictions Weight Bearing Restrictions Per Provider Order: No     Mobility Bed Mobility Overal bed mobility: Needs Assistance Bed Mobility: Sidelying to Sit, Sit to  Supine   Sidelying to sit: Supervision   Sit to supine: Supervision        Transfers Overall transfer level: Needs assistance Equipment used: None Transfers: Sit to/from Stand Sit to Stand: Contact guard assist           General transfer comment: CGA for safety.      Balance                                           ADL either performed or assessed with clinical judgement   ADL Overall ADL's : Needs assistance/impaired Eating/Feeding: Independent;Sitting   Grooming: Standing;Contact guard assist;Brushing hair   Upper Body Bathing: Standing;Contact guard assist   Lower Body Bathing: Sitting/lateral leans;Set up   Upper Body Dressing : Sitting;Set up   Lower Body Dressing: Supervision/safety;Sit to/from stand Lower Body Dressing Details (indicate cue type and reason): mod I in sitting Toilet Transfer: Contact guard assist;Ambulation Toilet Transfer Details (indicate cue type and reason): no AD Toileting- Clothing Manipulation and Hygiene: Sit to/from stand;Contact guard assist       Functional mobility during ADLs: Contact guard assist       Vision Baseline Vision/History: 3 Glaucoma Ability to See in Adequate Light: 0 Adequate Patient Visual Report: No change from baseline Vision Assessment?: No apparent visual deficits Additional Comments: no visual impairments noted on EVAL, pt tracking all quads WFL and completing FTN without dysmetria.     Perception Perception: Within Functional Limits       Praxis Praxis: [facility]  Pertinent Vitals/Pain Pain Assessment Pain Assessment: No/denies pain     Extremity/Trunk Assessment Upper Extremity Assessment Upper Extremity Assessment: Overall WFL for tasks assessed   Lower Extremity Assessment Lower Extremity Assessment: Defer to PT evaluation   Cervical / Trunk Assessment Cervical / Trunk Assessment: Kyphotic   Communication Communication Communication: Impaired Factors  Affecting Communication: Difficulty expressing self (word finding difficulties.)   Cognition Arousal: Alert Behavior During Therapy: WFL for tasks assessed/performed Cognition: No apparent impairments             OT - Cognition Comments: No apparant cog deficits, trouble with word finding which is impacting effective communication. A&0x2                 Following commands: Intact       Cueing  General Comments   Cueing Techniques: Verbal cues  Bp supine in bed 187/79(108), standing EOB 162/69(96). Per hospitalist notes the pt BP runs high normally and they are allowing it at this time   Exercises     Shoulder Instructions      Home Living Family/patient expects to be discharged to:: Private residence Living Arrangements: Spouse/significant other Available Help at Discharge: Family;Neighbor (Pt lives with husband. Neighbors often assist. Daughter lives nearby.) Type of Home: House Home Access: Stairs to enter Secretary/administrator of Steps: 2 Entrance Stairs-Rails: Left Home Layout: One level     Bathroom Shower/Tub: Producer, television/film/video: Handicapped height     Home Equipment: Agricultural consultant (2 wheels);Cane - quad;Cane - single point;BSC/3in1;Grab bars - tub/shower;Wheelchair - manual   Additional Comments: BSC over toilet. place a chair in theri shower. just 1 fall PTA  Lives With: Spouse    Prior Functioning/Environment Prior Level of Function : Independent/Modified Independent;History of Falls (last six months)             Mobility Comments: Ind no AD ADLs Comments: Ind, husband performs medication mangaement.    OT Problem List: Other (comment) (impaired communication)   OT Treatment/Interventions:        OT Goals(Current goals can be found in the care plan section)   Acute Rehab OT Goals Patient Stated Goal: To go home OT Goal Formulation: With patient/family Time For Goal Achievement: 08/18/23 Potential to Achieve  Goals: Good ADL Goals Pt Will Perform Grooming: with modified independence;standing Pt Will Perform Lower Body Dressing: sit to/from stand;with modified independence Pt Will Transfer to Toilet: with supervision;ambulating Additional ADL Goal #1: Pt will read items on pill box label without errors to furhter assess independence with medication management.   OT Frequency:       Co-evaluation              AM-PAC OT "6 Clicks" Daily Activity     Outcome Measure Help from another person eating meals?: None Help from another person taking care of personal grooming?: A Little Help from another person toileting, which includes using toliet, bedpan, or urinal?: A Little Help from another person bathing (including washing, rinsing, drying)?: A Little Help from another person to put on and taking off regular upper body clothing?: A Little Help from another person to put on and taking off regular lower body clothing?: A Little 6 Click Score: 19   End of Session Equipment Utilized During Treatment: Gait belt Nurse Communication: Mobility status  Activity Tolerance: Patient tolerated treatment well Patient left: in bed;with call bell/phone within reach;with bed alarm set;with family/visitor present  OT Visit Diagnosis: Muscle weakness (generalized) (M62.81);Other abnormalities of gait  and mobility (R26.89);Cognitive communication deficit (R41.841) Symptoms and signs involving cognitive functions: Other cerebrovascular disease                Time: 7829-5621 OT Time Calculation (min): 26 min Charges:  OT General Charges $OT Visit: 1 Visit OT Evaluation $OT Eval Low Complexity: 1 Low OT Treatments $Therapeutic Activity: 8-22 mins  08/04/2023  AB, OTR/L  Acute Rehabilitation Services  Office: [phone]   Jorene New 08/04/2023, 6:25 PM

## 2023-08-04 NOTE — Progress Notes (Addendum)
 STROKE TEAM PROGRESS NOTE   INTERIM HISTORY/SUBJECTIVE  Patient continues to have halting speech with word finding difficulties.  She has been hemodynamically stable and afebrile and reports pain in her right shoulder and right leg below the knee. MRI scan shows small left frontal subcortical white matter infarct.  CT angiogram showed supraclinoid right ICA stenosis. OBJECTIVE  CBC    Component Value Date/Time   WBC 5.3 08/04/2023 0201   RBC 3.59 (L) 08/04/2023 0201   HGB 12.5 08/04/2023 0201   HCT 37.0 08/04/2023 0201   HCT 42.3 03/22/2017 0336   PLT 163 08/04/2023 0201   MCV 103.1 (H) 08/04/2023 0201   MCH 34.8 (H) 08/04/2023 0201   MCHC 33.8 08/04/2023 0201   RDW 12.9 08/04/2023 0201   LYMPHSABS 1.7 08/03/2023 1125   MONOABS 0.6 08/03/2023 1125   EOSABS 0.2 08/03/2023 1125   BASOSABS 0.0 08/03/2023 1125    BMET    Component Value Date/Time   NA 141 08/03/2023 1129   NA 141 11/23/2022 0854   K 3.4 (L) 08/03/2023 1129   CL 108 08/03/2023 1129   CO2 21 (L) 08/03/2023 1125   GLUCOSE 133 (H) 08/03/2023 1129   BUN 20 08/03/2023 1129   BUN 28 (H) 11/23/2022 0854   CREATININE 0.86 08/04/2023 0201   CALCIUM  8.7 (L) 08/03/2023 1125   EGFR 54 (L) 11/23/2022 0854   GFRNONAA >60 08/04/2023 0201    IMAGING past 24 hours MR BRAIN WO CONTRAST Result Date: 08/03/2023 CLINICAL DATA:  Transient ischemic attack EXAM: MRI HEAD WITHOUT CONTRAST TECHNIQUE: Multiplanar, multiecho pulse sequences of the brain and surrounding structures were obtained without intravenous contrast. COMPARISON:  10/08/2020 FINDINGS: Brain: Small foci of acute ischemia within the left frontal white matter. No acute or chronic hemorrhage. There is multifocal hyperintense T2-weighted signal within the white matter. Generalized volume loss. Old right cerebellar small vessel infarct. The midline structures are normal. Vascular: Normal flow voids. Skull and upper cervical spine: Normal calvarium and skull base.  Visualized upper cervical spine and soft tissues are normal. Sinuses/Orbits:No paranasal sinus fluid levels or advanced mucosal thickening. No mastoid or middle ear effusion. Normal orbits. IMPRESSION: 1. Small foci of acute ischemia within the left frontal white matter. No hemorrhage or mass effect. 2. 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 Electronically Signed   By: Juanetta Nordmann M.D.   On: 08/03/2023 23:58   CT ANGIO HEAD NECK W WO CM Result Date: 08/03/2023 CLINICAL DATA:  Neuro deficit, concern for stroke. Difficulty speaking upon waking this morning. Vision changes. EXAM: CT ANGIOGRAPHY HEAD AND NECK WITH AND WITHOUT CONTRAST TECHNIQUE: Multidetector CT imaging of the head and neck was performed using the standard protocol during bolus administration of intravenous contrast. Multiplanar CT image reconstructions and MIPs were obtained to evaluate the vascular anatomy. Carotid stenosis measurements (when applicable) are obtained utilizing NASCET criteria, using the distal internal carotid diameter as the denominator. RADIATION DOSE REDUCTION: This exam was performed according to the departmental dose-optimization program which includes automated exposure control, adjustment of the mA and/or kV according to patient size and/or use of iterative reconstruction technique. CONTRAST:  75mL OMNIPAQUE  IOHEXOL  350 MG/ML SOLN COMPARISON:  CT head 07/31/2022 FINDINGS: CT HEAD FINDINGS Brain: No acute intracranial hemorrhage. No CT evidence of acute infarct. Nonspecific hypoattenuation in the periventricular and subcortical white matter favored to reflect chronic microvascular ischemic changes. Redemonstrated small remote infarct in the inferior right cerebellum. Additional remote infarct involving the anterior limb  of the left internal capsule. No edema, mass effect, or midline shift. The basilar cisterns are patent. Ventricles: Prominence of the ventricles suggestive  of underlying parenchymal volume loss. Vascular: No hyperdense vessel. Intracranial atherosclerotic calcifications noted. Skull: No acute or aggressive finding. Sinuses/orbits: The visualized paranasal sinuses are clear. Orbits are symmetric. Other: Mastoid air cells are clear. CTA NECK FINDINGS Aortic arch: Standard configuration of the aortic arch. Imaged portion shows no evidence of aneurysm or dissection. Mild atherosclerosis of the aortic arch. No significant stenosis of the major arch vessel origins. Pulmonary arteries: As permitted by contrast timing, there are no filling defects in the visualized pulmonary arteries. Subclavian arteries: Patent bilaterally. Atherosclerosis at the origin of the left subclavian artery and along the proximal right subclavian artery without significant stenosis. Right carotid system: Patent. Mild atherosclerosis at the carotid bifurcation without hemodynamically significant stenosis. No evidence of dissection. Left carotid system: Patent. Mild atherosclerosis at the carotid bifurcation without hemodynamically significant stenosis. No evidence of dissection. Vertebral arteries: Left vertebral artery is dominant. No evidence of dissection, stenosis (50% or greater), or occlusion. Tortuosity of the left V1 segment. Skeleton: No acute findings. Degenerative changes in the cervical spine. Disc space narrowing most pronounced at C5-6 and C6-7. Degenerative changes of the mandibular condyles. Other neck: The visualized airway is patent. No cervical lymphadenopathy. Upper chest: Heterogeneous opacities in the bilateral upper lobes. Review of the MIP images confirms the above findings CTA HEAD FINDINGS ANTERIOR CIRCULATION: The intracranial ICAs are patent bilaterally. Atherosclerosis of the bilateral carotid siphons. Focal moderate stenosis of the right supraclinoid ICA. No severe stenosis, proximal occlusion, aneurysm, or vascular malformation. MCAs: The middle cerebral arteries are  patent bilaterally. ACAs: The anterior cerebral arteries are patent bilaterally. POSTERIOR CIRCULATION: No significant stenosis, proximal occlusion, aneurysm, or vascular malformation. PCAs: The posterior cerebral arteries are patent bilaterally. Pcomm: Not well visualized. SCAs: The superior cerebellar arteries are patent bilaterally. Basilar artery: Patent AICAs: Visualized on the right. PICAs: Visualized on the left. Vertebral arteries: The intracranial vertebral arteries are patent. Venous sinuses: As permitted by contrast timing, patent. Anatomic variants: None Review of the MIP images confirms the above findings IMPRESSION: No large vessel occlusion. Multifocal atherosclerosis as above. Focal moderate stenosis of the right supraclinoid ICA. No CT evidence of acute intracranial abnormality. Moderate chronic microvascular ischemic changes and parenchymal volume loss. Redemonstrated remote infarcts in the inferior right cerebellum and left internal capsule. Heterogeneous pulmonary opacities in the bilateral upper lobes. Recommend correlation with dedicated chest imaging. Aortic Atherosclerosis (ICD10-I70.0). Electronically Signed   By: Denny Flack M.D.   On: 08/03/2023 17:26    Vitals:   08/04/23 0745 08/04/23 0909 08/04/23 1000 08/04/23 1140  BP: (!) 172/66  (!) 186/74 (!) 176/74  Pulse: 82  83 74  Resp: (!) 22  (!) 21 (!) 21  Temp:  (!) 97.4 F (36.3 C)    TempSrc:  Oral    SpO2: 92%  95% 96%  Weight:      Height:         PHYSICAL EXAM General:  Alert, well-nourished, well-developed patient in no acute distress Psych:  Mood and affect appropriate for situation CV: Regular rate and rhythm on monitor Respiratory:  Regular, unlabored respirations on room air   NEURO:  Mental Status: Patient responds to name and is able to answer questions about her situation with yes or no Speech/Language: speech is without dysarthria but is halting in character, and in single words and short phrases  with word finding  difficulties.  Patient is able to name objects and repeat phrases with some difficulty.  Cranial Nerves:  II: PERRL. Visual fields full.  III, IV, VI: EOMI. Eyelids elevate symmetrically.  V: Sensation is intact to light touch and symmetrical to face.  VII: Face is symmetrical resting and smiling VIII: hearing intact to voice. IX, X: Phonation is normal.  ZO:[id number] shrug on the left XII: tongue is midline without fasciculations. Motor: 5/5 strength to all muscle groups tested some drift noted in right upper and lower extremities Tone: is normal and bulk is normal Sensation- Intact to light touch bilaterally.  Coordination: FTN intact bilaterally Gait- deferred  Most Recent NIH  1a Level of Conscious.:0  1b LOC Questions: 0 1c LOC Commands: 0 2 Best Gaze: 0 3 Visual: 0 4 Facial Palsy: 0 5a Motor Arm - left: 0 5b Motor Arm - Right: 1 6a Motor Leg - Left: 0 6b Motor Leg - Right: 1 7 Limb Ataxia: 0 8 Sensory: 0 9 Best Language: 2 10 Dysarthria: 0 11 Extinct. and Inatten.: 0 TOTAL: 4   ASSESSMENT/PLAN  [name] is a 83 y.o. female with history of hypertension, hyperlipidemia, diabetes, GERD MI and glaucoma admitted for dysarthria and aphasia along with right-sided weakness.  MRI demonstrates left frontal white matter stroke.  NIH on Admission 4  Acute Ischemic Infarct:  left frontal white matter infarct Etiology: Small vessel disease CT head No acute abnormality. Small vessel disease.  CTA head & neck no LVO MRI small focus of acute ischemia within left frontal white matter, chronic small vessel ischemic disease 2D Echo ejection fraction 60 to 65%.  Left atrium mildly dilated Recommend 30-day outpatient heart monitor at discharge LDL 46 HgbA1c 6.1 VTE prophylaxis -subcutaneous heparin  aspirin  81 mg daily prior to admission, now on aspirin  81 mg daily and clopidogrel  75 mg daily for 12 weeks and then Plavix  alone. Therapy recommendations:   CIR Disposition: pending  Hypertension Home meds: Amlodipine  5 mg daily, metoprolol  25 mg twice daily, hydrochlorothiazide  12.5 mg daily, valsartan  160 mg daily Stable Blood Pressure Goal: BP less than 220/110   Hyperlipidemia Home meds: Atorvastatin  80 mg daily, resumed in hospital LDL 46, goal < 70 Continue statin at discharge  Diabetes type II Controlled Home meds: None HgbA1c 6.1, goal < 7.0 CBGs SSI Recommend close follow-up with PCP for better DM control  Other Stroke Risk Factors Obesity, Body mass index is 31.55 kg/m., BMI >/= 30 associated with increased stroke risk, recommend weight loss, diet and exercise as appropriate  Coronary artery disease   Other Active Problems None  Hospital day # 1  Patient seen by NP with MD, MD to edit note as needed. Cortney E Bucky Cardinal , MSN, AGACNP-BC [facility] See Amion for schedule and pager information 08/04/2023 1:29 PM  I have personally obtained history,examined this patient, reviewed notes, independently viewed imaging studies, participated in medical decision making and plan of care.ROS completed by me personally and pertinent positives fully documented  I have made any additions or clarifications directly to the above note. Agree with note above.  She presented with speech difficulties and right-sided weakness secondary to small left frontal subcortical white matter infarct likely from small vessel disease.  Recommend aspirin  and Plavix  for 3 weeks followed by Plavix  alone and aggressive risk factor modification.  Also recommend outpatient 30-day heart monitor for paroxysmal A-fib given dilated left atrium on echo.  Continue ongoing stroke workup.  Greater than 50% time during this 50-minute  visit was spent in counseling and coordination of care and discussion with patient and care team.  Ardella Beaver, MD Medical Director [facility] Pager: [phone] 08/04/2023 3:59 PM   To contact Stroke  Continuity provider, please refer to [email or web address]. After hours, contact General Neurology

## 2023-08-04 NOTE — Progress Notes (Signed)
  Progress Note   Patient: Cassandra Brooks BJY:[id number] DOB: April 21, 1940 DOA: 08/03/2023     1 DOS: the patient was seen and examined on 08/04/2023  Assessment and Plan: Acute CVA L front white matter - ASA 81 mg PO daily  - Lipitor 80 mg PO daily  - Plavix  75 mg PO daily  - Appreciate neurology following  - PT/OT  - Speech consult - EEG pending   DM2 - Monitor   CAD - As above   HTN - Allow for permissive HTN in the setting of acute CVA   GERD - Protonix  40 mg PO daily   Fibromyalgia  - Effexor  - XR 150 mg PO daily   HLD  - Statin as above   Depression  - Effexor  as above   Subjective: Pt seen and examined at the bedside. Pt in good spirits. She reports that normally her BP runs on the higher side (which we will allow due to her acute CVA). EEG pending. Appreciate neurology following. Diet as per speech.  Physical Exam: Vitals:   08/04/23 0745 08/04/23 0909 08/04/23 1000 08/04/23 1140  BP: (!) 172/66  (!) 186/74 (!) 176/74  Pulse: 82  83 74  Resp: (!) 22  (!) 21 (!) 21  Temp:  (!) 97.4 F (36.3 C)    TempSrc:  Oral    SpO2: 92%  95% 96%  Weight:      Height:       Physical Exam Constitutional:      Appearance: Normal appearance.  HENT:     Head: Normocephalic.     Mouth/Throat:     Mouth: Mucous membranes are moist.  Cardiovascular:     Rate and Rhythm: Normal rate and regular rhythm.  Pulmonary:     Effort: Pulmonary effort is normal.  Abdominal:     Palpations: Abdomen is soft.  Musculoskeletal:        General: Normal range of motion.     Cervical back: Neck supple.     Comments: 4/5 in all extremities   Skin:    General: Skin is warm.  Neurological:     Mental Status: She is alert. Mental status is at baseline.  Psychiatric:        Mood and Affect: Mood normal.        Disposition: Status is: Inpatient Remains inpatient appropriate because: EEG and ongoing neurology eval   Planned Discharge Destination: Home    Time spent: 35  minutes  Author: Brilee Port , MD 08/04/2023 1:04 PM  For on call review [email or web address].

## 2023-08-04 NOTE — Progress Notes (Signed)
 EEG complete, results pending review.

## 2023-08-04 NOTE — ED Notes (Signed)
 Patient assisted to bedside toilet at this time. Patient's husband remains at bedside and stated he would call RN when patient is finished.

## 2023-08-05 ENCOUNTER — Other Ambulatory Visit (HOSPITAL_COMMUNITY): Payer: Self-pay

## 2023-08-05 DIAGNOSIS — G459 Transient cerebral ischemic attack, unspecified: Secondary | ICD-10-CM | POA: Diagnosis not present

## 2023-08-05 LAB — COMPREHENSIVE METABOLIC PANEL WITH GFR
ALT: 21 U/L (ref 0–44)
AST: 26 U/L (ref 15–41)
Albumin: 2.8 g/dL — ABNORMAL LOW (ref 3.5–5.0)
Alkaline Phosphatase: 48 U/L (ref 38–126)
Anion gap: 8 (ref 5–15)
BUN: 11 mg/dL (ref 8–23)
CO2: 28 mmol/L (ref 22–32)
Calcium: 8.6 mg/dL — ABNORMAL LOW (ref 8.9–10.3)
Chloride: 108 mmol/L (ref 98–111)
Creatinine, Ser: 0.94 mg/dL (ref 0.44–1.00)
GFR, Estimated: >60 mL/min (ref >60)
Glucose, Bld: 146 mg/dL — ABNORMAL HIGH (ref 70–99)
Potassium: 3.6 mmol/L (ref 3.5–5.1)
Sodium: 144 mmol/L (ref 135–145)
Total Bilirubin: 0.8 mg/dL (ref 0.0–1.2)
Total Protein: 6.5 g/dL (ref 6.5–8.1)

## 2023-08-05 LAB — CBC
HCT: 38.8 % (ref 36.0–46.0)
Hemoglobin: 13.1 g/dL (ref 12.0–15.0)
MCH: 34.6 pg — ABNORMAL HIGH (ref 26.0–34.0)
MCHC: 33.8 g/dL (ref 30.0–36.0)
MCV: 102.4 fL — ABNORMAL HIGH (ref 80.0–100.0)
Platelets: 156 10*3/uL (ref 150–400)
RBC: 3.79 MIL/uL — ABNORMAL LOW (ref 3.87–5.11)
RDW: 12.9 % (ref 11.5–15.5)
WBC: 4.1 10*3/uL (ref 4.0–10.5)
nRBC: 0 % (ref 0.0–0.2)

## 2023-08-05 LAB — MAGNESIUM: Magnesium: 1.4 mg/dL — ABNORMAL LOW (ref 1.7–2.4)

## 2023-08-05 LAB — TSH: TSH: 1.746 u[IU]/mL (ref 0.350–4.500)

## 2023-08-05 LAB — T4, FREE: Free T4: 1.34 ng/dL — ABNORMAL HIGH (ref 0.61–1.12)

## 2023-08-05 MED ORDER — ASPIRIN EC 81 MG PO TBEC: 81.0000 mg | DELAYED_RELEASE_TABLET | Freq: Every day | ORAL | Status: DC

## 2023-08-05 MED ORDER — PANTOPRAZOLE SODIUM 40 MG PO TBEC
40.0000 mg | DELAYED_RELEASE_TABLET | Freq: Every day | ORAL | 0 refills | Status: DC
  Filled 2023-08-05: qty 30, 30d supply, fill #0

## 2023-08-05 MED ORDER — VALSARTAN 160 MG PO TABS: 160.0000 mg | ORAL_TABLET | Freq: Every day | ORAL | Status: AC

## 2023-08-05 MED ORDER — CLOPIDOGREL BISULFATE 75 MG PO TABS
75.0000 mg | ORAL_TABLET | Freq: Every day | ORAL | 1 refills | Status: AC
  Filled 2023-08-05: qty 30, 30d supply, fill #0

## 2023-08-05 MED ORDER — ASPIRIN EC 81 MG PO TBEC: 81.0000 mg | DELAYED_RELEASE_TABLET | Freq: Every day | ORAL | Status: AC

## 2023-08-05 MED ORDER — AMLODIPINE BESYLATE 5 MG PO TABS: 5.0000 mg | ORAL_TABLET | Freq: Every day | ORAL | Status: DC

## 2023-08-05 NOTE — Discharge Summary (Addendum)
 Cassandra Brooks WUJ:[id number] DOB: [birth year] DOA: 08/03/2023  PCP: Olga Berthold, NP  Admit date: 08/03/2023  Discharge date: 08/05/2023  Admitted From: Home   Disposition:  Home   Recommendations for Outpatient Follow-up:   Follow up with PCP in 1-2 weeks  PCP Please obtain BMP/CBC, 2 view CXR in 1week,  (see Discharge instructions)   PCP Please follow up on the following pending results:    Home Health: PT, SLP   Equipment/Devices: walker  Consultations: Neuro Discharge Condition: Stable    CODE STATUS: Full    Diet Recommendation: Heart Healthy     Chief Complaint  Patient presents with   Aphasia          Brief history of present illness from the day of admission and additional interim summary    83 y.o. female with medical history significant of  DMII, AAA, CAD s/p MI, HTN , GERD, fibromyalgia HLD, Depression who presents to ED with complaint of aphasia that was noted around 7:30 this am with last know well of 9:30 the night prior. Patient course further complicated with onset of right sided numbness and weakness of upper and lower extremities. Currently patient still has difficulty with word finding and also decrease strength in right upper and lower extremities. Patient notes no change in vision no difficulty swallowing, no n/v//d /sob or chest pain, workup confirmed acute stroke.                                                                 Hospital Course   Acute CVA L front white matter - Full stroke workup done per stroke team DAPT for 12 weeks thereafter Plavix  alone, LDL and A1c at goal, home statin continued, symptoms largely resolved except mild expressive aphasia for which she will get home PT and SLP if she qualifies for.  Echo and CTA nonacute.  Daughter bedside agrees with the plan.    DM2 - Good control stable A1c, continue home regimen follow-up with PCP   CAD - Completely symptom-free no acute issues continue home medications   HTN - Allow for permissive HTN in the setting of acute CVA, reintroduce home blood pressure medications in a graduated fashion over the next couple of days.   GERD - Continue home PPI   Fibromyalgia  - Effexor  - XR 150 mg PO daily    HLD  - Statin at home dose continue, LDL at goal   Depression  - Effexor  as above    Discharge diagnosis     Principal Problem:   TIA (transient ischemic attack)    Discharge instructions    Discharge Instructions     Diet - low sodium heart healthy   Complete by: As directed    Discharge instructions   Complete by: As directed  Follow with Primary MD Moon, Amy A, NP in 7 days   Get CBC, CMP, 2 view Chest X ray -  checked next visit with your primary MD   Activity: As tolerated with Full fall precautions use walker/cane & assistance as needed  Disposition Home     Diet: Heart Healthy     Special Instructions: If you have smoked or chewed Tobacco  in the last 2 yrs please stop smoking, stop any regular Alcohol  and or any Recreational drug use.  On your next visit with your primary care physician please Get Medicines reviewed and adjusted.  Please request your Prim.MD to go over all Hospital Tests and Procedure/Radiological results at the follow up, please get all Hospital records sent to your Prim MD by signing hospital release before you go home.  If you experience worsening of your admission symptoms, develop shortness of breath, life threatening emergency, suicidal or homicidal thoughts you must seek medical attention immediately by calling 911 or calling your MD immediately  if symptoms less severe.  You Must read complete instructions/literature along with all the possible adverse reactions/side effects for all the Medicines you take and that have been prescribed to you. Take  any new Medicines after you have completely understood and accpet all the possible adverse reactions/side effects.   Do not drive when taking Pain medications.  Do not take more than prescribed Pain, Sleep and Anxiety Medications  Wear Seat belts while driving.   Increase activity slowly   Complete by: As directed        Discharge Medications   Allergies as of 08/05/2023       Reactions   Zestril  [lisinopril ] Cough        Medication List     STOP taking these medications    omeprazole 20 MG capsule Commonly known as: PRILOSEC Replaced by: pantoprazole  40 MG tablet       TAKE these medications    acetaminophen  650 MG CR tablet Commonly known as: TYLENOL  Take 650 mg by mouth 2 (two) times daily.   amLODipine  5 MG tablet Commonly known as: NORVASC  Take 1 tablet (5 mg total) by mouth daily. Start taking on: August 07, 2023 What changed: These instructions start on August 07, 2023. If you are unsure what to do until then, ask your doctor or other care provider.   amoxicillin  500 MG tablet Commonly known as: AMOXIL  take 4 pills one hour prior to dental work   aspirin  EC 81 MG tablet Take 1 tablet (81 mg total) by mouth at bedtime.   atorvastatin  80 MG tablet Commonly known as: LIPITOR Take 1 tablet (80 mg total) by mouth daily.   clopidogrel  75 MG tablet Commonly known as: PLAVIX  Take 1 tablet (75 mg total) by mouth daily.   diclofenac  Sodium 1 % Gel Commonly known as: Voltaren  Apply 2 g topically 4 (four) times daily. What changed:  when to take this reasons to take this   dorzolamide -timolol  2-0.5 % ophthalmic solution Commonly known as: COSOPT  Place 1 drop into both eyes 2 (two) times daily.   fluticasone  50 MCG/ACT nasal spray Commonly known as: FLONASE  Place 2 sprays into both nostrils daily.   hydrochlorothiazide  12.5 MG tablet Commonly known as: HYDRODIURIL  Take 12.5 mg by mouth daily.   Lumigan 0.01 % Soln Generic drug: bimatoprost Place  1 drop into both eyes at bedtime.   metoprolol  tartrate 25 MG tablet Commonly known as: LOPRESSOR  Take 1 tablet (25 mg total) by mouth 2 (  two) times daily.   Multivitamin Women 50+ Tabs Take 1 tablet by mouth daily.   nabumetone  750 MG tablet Commonly known as: RELAFEN  Take 750 mg by mouth 2 (two) times daily.   nitroGLYCERIN  0.4 MG SL tablet Commonly known as: NITROSTAT  Place 0.4 mg under the tongue every 5 (five) minutes as needed for chest pain.   pantoprazole  40 MG tablet Commonly known as: PROTONIX  Take 1 tablet (40 mg total) by mouth daily. Start taking on: August 06, 2023 Replaces: omeprazole 20 MG capsule   valsartan  160 MG tablet Commonly known as: Diovan  Take 1 tablet (160 mg total) by mouth at bedtime. Start taking on: August 07, 2023 What changed:  when to take this These instructions start on August 07, 2023. If you are unsure what to do until then, ask your doctor or other care provider.   venlafaxine  XR 150 MG 24 hr capsule Commonly known as: EFFEXOR -XR Take 1 capsule (150 mg total) by mouth daily with breakfast.               Durable Medical Equipment  (From admission, onward)           Start     Ordered   08/05/23 0912  For home use only DME lightweight manual wheelchair with seat cushion  Once       Comments: Patient suffers from weakness which impairs their ability to perform daily activities like grooming in the home.  A cane will not resolve  issue with performing activities of daily living. A wheelchair will allow patient to safely perform daily activities. Patient is not able to propel themselves in the home using a standard weight wheelchair due to general weakness. Patient can self propel in the lightweight wheelchair. Length of need Lifetime. Accessories: elevating leg rests (ELRs), wheel locks, extensions and anti-tippers.   08/05/23 0911   08/05/23 0827  For home use only DME Walker rolling  Once       Comments: 5 wheel  Question  Answer Comment  Walker: With 5 Inch Wheels   Patient needs a walker to treat with the following condition Weakness      08/05/23 0826             Follow-up Information     Moon, Amy A, NP. Schedule an appointment as soon as possible for a visit in 1 week(s).   Specialty: Internal Medicine Contact information: [address] [place] [address] [phone]         [facility]. Schedule an appointment as soon as possible for a visit in 1 week(s).   Specialty: Neurology Contact information: [address] [place]  [address] [phone]                Major procedures and Radiology Reports - PLEASE review detailed and final reports thoroughly  -       ECHOCARDIOGRAM COMPLETE Result Date: 08/04/2023    ECHOCARDIOGRAM REPORT   Patient Name:   Cassandra Brooks Date of Exam: 08/04/2023 Medical Rec #:  [id number]        Height:       61.0 in Accession #:    [id number]       Weight:       167.0 lb Date of Birth:  05-09-1940        BSA:          1.749 m Patient Age:    83 years  BP:           176/74 mmHg Patient Gender: F                HR:           75 bpm. Exam Location:  Inpatient Procedure: 2D Echo, Cardiac Doppler and Color Doppler (Both Spectral and Color            Flow Doppler were utilized during procedure). Indications:    TIA G45.9  History:        Patient has prior history of Echocardiogram examinations, most                 recent 08/02/2022. Acute MI and CAD, TIA, Signs/Symptoms:Syncope;                 Risk Factors:Hypertension, Diabetes and Dyslipidemia.  Sonographer:    Terrilee Few RCS Referring Phys: [id number] SARA-MAIZ A THOMAS IMPRESSIONS  1. Left ventricular ejection fraction, by estimation, is 60 to 65%. The left ventricle has normal function. The left ventricle has no regional wall motion abnormalities. Left ventricular diastolic parameters are consistent with Grade I diastolic  dysfunction (impaired relaxation).  2. Right ventricular systolic function is normal. The right ventricular size is normal. There is mildly elevated pulmonary artery systolic pressure. The estimated right ventricular systolic pressure is 37.8 mmHg.  3. Left atrial size was mildly dilated.  4. The mitral valve is normal in structure. Trivial mitral valve regurgitation. No evidence of mitral stenosis.  5. The aortic valve is tricuspid. Aortic valve regurgitation is not visualized. No aortic stenosis is present.  6. The inferior vena cava is normal in size with greater than 50% respiratory variability, suggesting right atrial pressure of 3 mmHg. FINDINGS  Left Ventricle: Left ventricular ejection fraction, by estimation, is 60 to 65%. The left ventricle has normal function. The left ventricle has no regional wall motion abnormalities. The left ventricular internal cavity size was normal in size. There is  no left ventricular hypertrophy. Left ventricular diastolic parameters are consistent with Grade I diastolic dysfunction (impaired relaxation). Right Ventricle: The right ventricular size is normal. No increase in right ventricular wall thickness. Right ventricular systolic function is normal. There is mildly elevated pulmonary artery systolic pressure. The tricuspid regurgitant velocity is 2.95  m/s, and with an assumed right atrial pressure of 3 mmHg, the estimated right ventricular systolic pressure is 37.8 mmHg. Left Atrium: Left atrial size was mildly dilated. Right Atrium: Right atrial size was normal in size. Pericardium: There is no evidence of pericardial effusion. Mitral Valve: The mitral valve is normal in structure. Mild to moderate mitral annular calcification. Trivial mitral valve regurgitation. No evidence of mitral valve stenosis. MV peak gradient, 4.8 mmHg. The mean mitral valve gradient is 2.0 mmHg. Tricuspid Valve: The tricuspid valve is normal in structure. Tricuspid valve regurgitation is trivial.  Aortic Valve: The aortic valve is tricuspid. Aortic valve regurgitation is not visualized. No aortic stenosis is present. Aortic valve peak gradient measures 9.6 mmHg. Pulmonic Valve: The pulmonic valve was normal in structure. Pulmonic valve regurgitation is trivial. Aorta: The aortic root is normal in size and structure. Venous: The inferior vena cava is normal in size with greater than 50% respiratory variability, suggesting right atrial pressure of 3 mmHg. IAS/Shunts: No atrial level shunt detected by color flow Doppler.  LEFT VENTRICLE PLAX 2D LVIDd:         4.90 cm   Diastology LVIDs:  2.80 cm   LV e' medial:    7.83 cm/s LV PW:         0.70 cm   LV E/e' medial:  10.0 LV IVS:        0.70 cm   LV e' lateral:   8.92 cm/s LVOT diam:     1.90 cm   LV E/e' lateral: 8.8 LV SV:         60 LV SV Index:   34 LVOT Area:     2.84 cm  RIGHT VENTRICLE RV S prime:     17.70 cm/s TAPSE (M-mode): 1.8 cm LEFT ATRIUM             Index        RIGHT ATRIUM           Index LA diam:        4.30 cm 2.46 cm/m   RA Area:     14.50 cm LA Vol (A2C):   47.8 ml 27.32 ml/m  RA Volume:   33.20 ml  18.98 ml/m LA Vol (A4C):   56.7 ml 32.41 ml/m LA Biplane Vol: 54.4 ml 31.10 ml/m  AORTIC VALVE AV Area (Vmax): 1.95 cm AV Vmax:        155.00 cm/s AV Peak Grad:   9.6 mmHg LVOT Vmax:      106.40 cm/s LVOT Vmean:     69.520 cm/s LVOT VTI:       0.212 m  AORTA Ao Root diam: 3.10 cm Ao Asc diam:  3.20 cm MITRAL VALVE               TRICUSPID VALVE MV Area (PHT): 4.06 cm    TR Peak grad:   34.8 mmHg MV Area VTI:   2.72 cm    TR Vmax:        295.00 cm/s MV Peak grad:  4.8 mmHg MV Mean grad:  2.0 mmHg    SHUNTS MV Vmax:       1.10 m/s    Systemic VTI:  0.21 m MV Vmean:      58.8 cm/s   Systemic Diam: 1.90 cm MV Decel Time: 187 msec MV E velocity: 78.60 cm/s MV A velocity: 96.90 cm/s MV E/A ratio:  0.81 Dalton McleanMD Electronically signed by Archer Bear Signature Date/Time: 08/04/2023/3:55:06 PM    Final    EEG adult Result Date:  08/04/2023 Arleene Lack, MD     08/04/2023  3:17 PM Patient Name: Cassandra Brooks MRN: [id number] Epilepsy Attending: Arleene Lack Referring Physician/Provider: Roxan Copes, MD Date: 08/04/2023 Duration: 23.47 mins Patient history: 83 yo F with dysarthria and aphasia along with right-sided weakness. EEG to evaluate for seizure Level of alertness: Awake AEDs during EEG study: None Technical aspects: This EEG study was done with scalp electrodes positioned according to the 10-20 International system of electrode placement. Electrical activity was reviewed with band pass filter of 1-70Hz , sensitivity of 7 uV/mm, display speed of 43mm/sec with a 60Hz  notched filter applied as appropriate. EEG data were recorded continuously and digitally stored.  Video monitoring was available and reviewed as appropriate. Description: The posterior dominant rhythm consists of 7.5 Hz activity of moderate voltage (25-35 uV) seen predominantly in posterior head regions, symmetric and reactive to eye opening and eye closing. EEG showed intermittent rhythmic 3-4 Hz theta-delta slowing in left temporal region. Hyperventilation and photic stimulation were not performed.   ABNORMALITY - Intermittent slow, left temporal region. IMPRESSION: This study is suggestive of non  specific cortical dysfunction arising from left temporal region. No seizures or epileptiform discharges were seen throughout the recording. Arleene Lack   MR BRAIN WO CONTRAST Result Date: 08/03/2023 CLINICAL DATA:  Transient ischemic attack EXAM: MRI HEAD WITHOUT CONTRAST TECHNIQUE: Multiplanar, multiecho pulse sequences of the brain and surrounding structures were obtained without intravenous contrast. COMPARISON:  10/08/2020 FINDINGS: Brain: Small foci of acute ischemia within the left frontal white matter. No acute or chronic hemorrhage. There is multifocal hyperintense T2-weighted signal within the white matter. Generalized volume loss. Old right  cerebellar small vessel infarct. The midline structures are normal. Vascular: Normal flow voids. Skull and upper cervical spine: Normal calvarium and skull base. Visualized upper cervical spine and soft tissues are normal. Sinuses/Orbits:No paranasal sinus fluid levels or advanced mucosal thickening. No mastoid or middle ear effusion. Normal orbits. IMPRESSION: 1. Small foci of acute ischemia within the left frontal white matter. No hemorrhage or mass effect. 2. 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 Electronically Signed   By: Juanetta Nordmann M.D.   On: 08/03/2023 23:58   CT ANGIO HEAD NECK W WO CM Result Date: 08/03/2023 CLINICAL DATA:  Neuro deficit, concern for stroke. Difficulty speaking upon waking this morning. Vision changes. EXAM: CT ANGIOGRAPHY HEAD AND NECK WITH AND WITHOUT CONTRAST TECHNIQUE: Multidetector CT imaging of the head and neck was performed using the standard protocol during bolus administration of intravenous contrast. Multiplanar CT image reconstructions and MIPs were obtained to evaluate the vascular anatomy. Carotid stenosis measurements (when applicable) are obtained utilizing NASCET criteria, using the distal internal carotid diameter as the denominator. RADIATION DOSE REDUCTION: This exam was performed according to the departmental dose-optimization program which includes automated exposure control, adjustment of the mA and/or kV according to patient size and/or use of iterative reconstruction technique. CONTRAST:  75mL OMNIPAQUE  IOHEXOL  350 MG/ML SOLN COMPARISON:  CT head 07/31/2022 FINDINGS: CT HEAD FINDINGS Brain: No acute intracranial hemorrhage. No CT evidence of acute infarct. Nonspecific hypoattenuation in the periventricular and subcortical white matter favored to reflect chronic microvascular ischemic changes. Redemonstrated small remote infarct in the inferior right cerebellum. Additional remote infarct involving the anterior  limb of the left internal capsule. No edema, mass effect, or midline shift. The basilar cisterns are patent. Ventricles: Prominence of the ventricles suggestive of underlying parenchymal volume loss. Vascular: No hyperdense vessel. Intracranial atherosclerotic calcifications noted. Skull: No acute or aggressive finding. Sinuses/orbits: The visualized paranasal sinuses are clear. Orbits are symmetric. Other: Mastoid air cells are clear. CTA NECK FINDINGS Aortic arch: Standard configuration of the aortic arch. Imaged portion shows no evidence of aneurysm or dissection. Mild atherosclerosis of the aortic arch. No significant stenosis of the major arch vessel origins. Pulmonary arteries: As permitted by contrast timing, there are no filling defects in the visualized pulmonary arteries. Subclavian arteries: Patent bilaterally. Atherosclerosis at the origin of the left subclavian artery and along the proximal right subclavian artery without significant stenosis. Right carotid system: Patent. Mild atherosclerosis at the carotid bifurcation without hemodynamically significant stenosis. No evidence of dissection. Left carotid system: Patent. Mild atherosclerosis at the carotid bifurcation without hemodynamically significant stenosis. No evidence of dissection. Vertebral arteries: Left vertebral artery is dominant. No evidence of dissection, stenosis (50% or greater), or occlusion. Tortuosity of the left V1 segment. Skeleton: No acute findings. Degenerative changes in the cervical spine. Disc space narrowing most pronounced at C5-6 and C6-7. Degenerative changes of the mandibular condyles. Other neck: The visualized airway is patent. No  cervical lymphadenopathy. Upper chest: Heterogeneous opacities in the bilateral upper lobes. Review of the MIP images confirms the above findings CTA HEAD FINDINGS ANTERIOR CIRCULATION: The intracranial ICAs are patent bilaterally. Atherosclerosis of the bilateral carotid siphons. Focal  moderate stenosis of the right supraclinoid ICA. No severe stenosis, proximal occlusion, aneurysm, or vascular malformation. MCAs: The middle cerebral arteries are patent bilaterally. ACAs: The anterior cerebral arteries are patent bilaterally. POSTERIOR CIRCULATION: No significant stenosis, proximal occlusion, aneurysm, or vascular malformation. PCAs: The posterior cerebral arteries are patent bilaterally. Pcomm: Not well visualized. SCAs: The superior cerebellar arteries are patent bilaterally. Basilar artery: Patent AICAs: Visualized on the right. PICAs: Visualized on the left. Vertebral arteries: The intracranial vertebral arteries are patent. Venous sinuses: As permitted by contrast timing, patent. Anatomic variants: None Review of the MIP images confirms the above findings IMPRESSION: No large vessel occlusion. Multifocal atherosclerosis as above. Focal moderate stenosis of the right supraclinoid ICA. No CT evidence of acute intracranial abnormality. Moderate chronic microvascular ischemic changes and parenchymal volume loss. Redemonstrated remote infarcts in the inferior right cerebellum and left internal capsule. Heterogeneous pulmonary opacities in the bilateral upper lobes. Recommend correlation with dedicated chest imaging. Aortic Atherosclerosis (ICD10-I70.0). Electronically Signed   By: Denny Flack M.D.   On: 08/03/2023 17:26    Micro Results     No results found for this or any previous visit (from the past 240 hours).  Today   Subjective    Cassandra Brooks today has no headache,no chest abdominal pain,no new weakness tingling or numbness, feels much better wants to go home today.     Objective   Blood pressure (!) 129/53, pulse 76, temperature 97.6 F (36.4 C), temperature source Oral, resp. rate 18, height 5\' 1"  (1.549 m), weight 75.8 kg, SpO2 91%.   Intake/Output Summary (Last 24 hours) at 08/05/2023 0913 Last data filed at 08/04/2023 1500 Gross per 24 hour  Intake 516.67 ml   Output --  Net 516.67 ml    Exam  Awake Alert, No new F.N deficits, minimal expressive aphasia,    [place].AT,PERRAL Supple Neck,   Symmetrical Chest wall movement, Good air movement bilaterally, CTAB RRR,No Gallops,   +ve B.Sounds, Abd Soft, Non tender,  No Cyanosis, Clubbing or edema    Data Review   Recent Labs  Lab 08/03/23 1125 08/03/23 1129 08/04/23 0201 08/05/23 0435  WBC 5.4  --  5.3 4.1  HGB 12.6 12.6 12.5 13.1  HCT 36.5 37.0 37.0 38.8  PLT 201  --  163 156  MCV 103.7*  --  103.1* 102.4*  MCH 35.8*  --  34.8* 34.6*  MCHC 34.5  --  33.8 33.8  RDW 12.9  --  12.9 12.9  LYMPHSABS 1.7  --   --   --   MONOABS 0.6  --   --   --   EOSABS 0.2  --   --   --   BASOSABS 0.0  --   --   --     Recent Labs  Lab 08/03/23 1125 08/03/23 1129 08/04/23 0201 08/05/23 0434 08/05/23 0435  NA 139 141  --   --  144  K 3.4* 3.4*  --   --  3.6  CL 108 108  --   --  108  CO2 21*  --   --   --  28  ANIONGAP 10  --   --   --  8  GLUCOSE 131* 133*  --   --  146*  BUN 20 20  --   --  11  CREATININE 0.98 1.10* 0.86  --  0.94  AST 27  --   --   --  26  ALT 21  --   --   --  21  ALKPHOS 59  --   --   --  48  BILITOT 1.3*  --   --   --  0.8  ALBUMIN 3.0*  --   --   --  2.8*  INR 1.1  --   --   --   --   TSH  --   --   --  1.746  --   HGBA1C  --   --  6.1*  --   --   MG  --   --   --   --  1.4*  CALCIUM  8.7*  --   --   --  8.6*   Lab Results  Component Value Date   CHOL 97 08/04/2023   HDL 31 (L) 08/04/2023   LDLCALC 36 08/04/2023   TRIG 152 (H) 08/04/2023   CHOLHDL 3.1 08/04/2023    Total Time in preparing paper work, data evaluation and todays exam - 35 minutes  Signature  -    Lynnwood Sauer M.D on 08/05/2023 at 9:13 AM   -  To page go to [email or web address]

## 2023-08-05 NOTE — Progress Notes (Signed)
 DISCHARGE NOTE HOME Cassandra Brooks to be discharged Home per MD order. Discussed prescriptions and follow up appointments with the patient. Prescriptions given to patient; medication list explained in detail. Patient verbalized understanding.  Skin clean, dry and intact without evidence of skin break down, no evidence of skin tears noted. IV catheter discontinued intact. Site without signs and symptoms of complications. Dressing and pressure applied. Pt denies pain at the site currently. No complaints noted.  Patient free of lines, drains, and wounds.   An After Visit Summary (AVS) was printed and given to the patient. Patient escorted via wheelchair, and discharged home via private auto.  Tonda Francisco, RN

## 2023-08-05 NOTE — Progress Notes (Signed)
 Occupational Therapy Treatment Patient Details Name: Cassandra Brooks MRN: [id number] DOB: 1940/12/29 Today's Date: 08/05/2023   History of present illness 83 y.o. female presenting to [facility] on 08/04/23 with aphasia and R sided weakness. EEG showed L temporal cortical dysfunction. MRI showed ischemia in the L frontal white matter. PMH including AAA, depression, fibromyalgia, glaucoma, syncope. Pt also has a history of L knee OA, L TKA   OT comments  Pt. Is DC home today with family assist. Pt. Performed ADLs sitting EOB without LOB. Pnt was Min a to fasten bra. Pt. Was able to don front opening shirt and fasten buttons. Pt. Was S with LE dressing for pants, socks, and slip on shoes. Pt. Was able to AMB without AD and no LOB to sink for grooming tasks. Pt. Will have necessary support at home.        If plan is discharge home, recommend the following:      Equipment Recommendations       Recommendations for Other Services      Precautions / Restrictions Precautions Precautions: Fall Recall of Precautions/Restrictions: Intact Restrictions Weight Bearing Restrictions Per Provider Order: No       Mobility Bed Mobility Overal bed mobility: Modified Independent             General bed mobility comments: A little effort gettting back into bed, but no physical assist required.    Transfers   Equipment used: None   Sit to Stand: Supervision                 Balance Overall balance assessment: Needs assistance Sitting-balance support: No upper extremity supported, Feet supported Sitting balance-Leahy Scale: Normal     Standing balance support: No upper extremity supported Standing balance-Leahy Scale: Good                   Standardized Balance Assessment Standardized Balance Assessment : Berg Balance Test Berg Balance Test Standing on One Leg: Tries to lift leg/unable to hold 3 seconds but remains standing independently       ADL either performed or  assessed with clinical judgement   ADL Overall ADL's : Independent     Grooming: Supervision/safety;Wash/dry hands;Wash/dry face;Brushing hair;Standing           Upper Body Dressing : Minimal assistance;Sitting   Lower Body Dressing: Supervision/safety;Set up;Sit to/from stand   Toilet Transfer: Supervision/safety;Ambulation           Functional mobility during ADLs:  (Pt. was S to AMB in room without LOB and without device.) General ADL Comments: Pt. was able to maintain sitting balance EOB.    Extremity/Trunk Assessment Upper Extremity Assessment Upper Extremity Assessment: Defer to OT evaluation   Lower Extremity Assessment Lower Extremity Assessment: RLE deficits/detail (Strength grossly 5/5 in LEs.) RLE Deficits / Details: mild sensory changes diminished light touch beginning around distal lateral thigh and calf. Reports improved from initial presentation. RLE Sensation: decreased light touch   Cervical / Trunk Assessment Cervical / Trunk Assessment: Kyphotic    Vision       Perception     Praxis     Communication Communication Communication: Impaired Factors Affecting Communication: Difficulty expressing self (word finding difficulties.)   Cognition Arousal: Alert Behavior During Therapy: WFL for tasks assessed/performed                                 Following commands: Intact  Cueing   Cueing Techniques: Verbal cues  Exercises      Shoulder Instructions       General Comments Daughter present, supportive.    Pertinent Vitals/ Pain          Home Living Family/patient expects to be discharged to:: Private residence Living Arrangements: Spouse/significant other Available Help at Discharge: Family;Neighbor (Pt lives with husband. Neighbors often assist. Daughter lives nearby.) Type of Home: House Home Access: Stairs to enter Secretary/administrator of Steps: 2 Entrance Stairs-Rails: Left Home Layout: One level      Bathroom Shower/Tub: Producer, television/film/video: Handicapped height     Home Equipment: Agricultural consultant (2 wheels);Cane - quad;Cane - single point;BSC/3in1;Grab bars - tub/shower;Wheelchair - manual   Additional Comments: BSC over toilet. place a chair in theri shower. just 1 fall PTA  Lives With: Spouse    Prior Functioning/Environment              Frequency           Progress Toward Goals  OT Goals(current goals can now be found in the care plan section)  Progress towards OT goals: Progressing toward goals  Acute Rehab OT Goals Patient Stated Goal: to go home today OT Goal Formulation: With patient Time For Goal Achievement: 08/18/23 Potential to Achieve Goals: Good ADL Goals Pt Will Perform Grooming: with modified independence;standing Pt Will Perform Lower Body Dressing: sit to/from stand;with modified independence Pt Will Transfer to Toilet: with supervision;ambulating Additional ADL Goal #1: Pt will properly identify and read 4/4 labels regarding safety  Plan      Co-evaluation                 AM-PAC OT "6 Clicks" Daily Activity     Outcome Measure   Help from another person eating meals?: None Help from another person taking care of personal grooming?: A Little Help from another person toileting, which includes using toliet, bedpan, or urinal?: A Little Help from another person bathing (including washing, rinsing, drying)?: A Little Help from another person to put on and taking off regular upper body clothing?: A Little Help from another person to put on and taking off regular lower body clothing?: A Little 6 Click Score: 19    End of Session    OT Visit Diagnosis: Muscle weakness (generalized) (M62.81);Other abnormalities of gait and mobility (R26.89);Cognitive communication deficit (R41.841) Symptoms and signs involving cognitive functions: Other cerebrovascular disease   Activity Tolerance Patient tolerated treatment well    Patient Left in bed;with family/visitor present   Nurse Communication  (ok therapy)        Time: 1610-9604 OT Time Calculation (min): 15 min  Charges: OT General Charges $OT Visit: 1 Visit OT Treatments $Self Care/Home Management : 8-22 mins  Neomi Banks OT/L   Aarion Kittrell 08/05/2023, 10:08 AM

## 2023-08-05 NOTE — Evaluation (Signed)
 Physical Therapy Evaluation Patient Details Name: Cassandra Brooks MRN: [id number] DOB: [birth year] Today's Date: 08/05/2023  History of Present Illness  83 y.o. female presenting to [facility] on 08/04/23 with aphasia and R sided weakness. EEG showed L temporal cortical dysfunction. MRI showed ischemia in the L frontal white matter. PMH including AAA, depression, fibromyalgia, glaucoma, syncope. Pt also has a history of L knee OA, L TKA  Clinical Impression  Pt admitted with above diagnosis. Feels close to baseline but complains of double vision with far distance objects, mild reduction of sensation to light touch of Rt distal thigh and calf, and walking slower than usual. Pt ambulates without assistive device at a supervision level. BERG indicates moderate fall risk. Therefore recommended SPC use, which she has at home, and supervision from family. Lives with husband - she is agreeable to HHPT follow-up. Pt currently with functional limitations due to the deficits listed below (see PT Problem List). Pt will benefit from acute skilled PT to increase their independence and safety with mobility to allow discharge.           If plan is discharge home, recommend the following: A little help with walking and/or transfers;A little help with bathing/dressing/bathroom;Direct supervision/assist for medications management;Direct supervision/assist for financial management;Assist for transportation;Help with stairs or ramp for entrance   Can travel by private vehicle        Equipment Recommendations None recommended by PT  Recommendations for Other Services       Functional Status Assessment Patient has had a recent decline in their functional status and demonstrates the ability to make significant improvements in function in a reasonable and predictable amount of time.     Precautions / Restrictions Precautions Precautions: Fall Recall of Precautions/Restrictions: Intact Restrictions Weight Bearing  Restrictions Per Provider Order: No      Mobility  Bed Mobility Overal bed mobility: Modified Independent             General bed mobility comments: A little effort gettting back into bed, but no physical assist required.    Transfers Overall transfer level: Modified independent Equipment used: None               General transfer comment: Stood from EOB with arms across her chest, stabilized without AD. Slower to rise.    Ambulation/Gait Ambulation/Gait assistance: Supervision Gait Distance (Feet): 200 Feet Assistive device: None Gait Pattern/deviations: Step-through pattern, Wide base of support, Decreased stance time - right, Decreased stride length, Decreased weight shift to right, Drifts right/left Gait velocity: dec Gait velocity interpretation: <1.8 ft/sec, indicate of risk for recurrent falls   General Gait Details: With distraction and dynamic challenges pt demonstrates increased sway but no overt stumbling or LOB requiring physical intervention. Relatively guarded with challenges involving turning head and target finding. Supervision for safety without AD. Educated on findings, safety, and recommended SPC use at home (which she has.)  Acupuncturist Bed    Modified Rankin (Stroke Patients Only) Modified Rankin (Stroke Patients Only) Pre-Morbid Rankin Score: No significant disability Modified Rankin: Moderate disability     Balance Overall balance assessment: Needs assistance Sitting-balance support: No upper extremity supported, Feet supported Sitting balance-Leahy Scale: Normal     Standing balance support: No upper extremity supported Standing balance-Leahy Scale: Good                   Standardized Balance Assessment Standardized Balance  Assessment : Berg Balance Test Berg Balance Test Sit to Stand: Able to stand without using hands and stabilize independently Standing Unsupported: Able to stand  safely 2 minutes Sitting with Back Unsupported but Feet Supported on Floor or Stool: Able to sit safely and securely 2 minutes Stand to Sit: Sits safely with minimal use of hands Transfers: Able to transfer safely, minor use of hands Standing Unsupported with Eyes Closed: Able to stand 10 seconds safely Standing Ubsupported with Feet Together: Able to place feet together independently and stand 1 minute safely From Standing, Reach Forward with Outstretched Arm: Can reach confidently >25 cm (10") From Standing Position, Pick up Object from Floor: Able to pick up shoe, needs supervision From Standing Position, Turn to Look Behind Over each Shoulder: Looks behind from both sides and weight shifts well Turn 360 Degrees: Able to turn 360 degrees safely in 4 seconds or less Standing Unsupported, Alternately Place Feet on Step/Stool: Able to complete 4 steps without aid or supervision Standing Unsupported, One Foot in Front: Able to take small step independently and hold 30 seconds Standing on One Leg: Tries to lift leg/unable to hold 3 seconds but remains standing independently Total Score: 48         Pertinent Vitals/Pain Pain Assessment Pain Assessment: No/denies pain    Home Living Family/patient expects to be discharged to:: Private residence Living Arrangements: Spouse/significant other Available Help at Discharge: Family;Neighbor (Pt lives with husband. Neighbors often assist. Daughter lives nearby.) Type of Home: House Home Access: Stairs to enter Entrance Stairs-Rails: Left Entrance Stairs-Number of Steps: 2   Home Layout: One level Home Equipment: Agricultural consultant (2 wheels);Cane - quad;Cane - single point;BSC/3in1;Grab bars - tub/shower;Wheelchair - manual Additional Comments: BSC over toilet. place a chair in theri shower. just 1 fall PTA    Prior Function Prior Level of Function : Independent/Modified Independent;History of Falls (last six months)             Mobility  Comments: Ind no AD. One fall onto couch after being hit my her screen door. ADLs Comments: Ind, husband performs medication mangaement.     Extremity/Trunk Assessment   Upper Extremity Assessment Upper Extremity Assessment: Defer to OT evaluation    Lower Extremity Assessment Lower Extremity Assessment: RLE deficits/detail (Strength grossly 5/5 in LEs.) RLE Deficits / Details: mild sensory changes diminished light touch beginning around distal lateral thigh and calf. Reports improved from initial presentation. RLE Sensation: decreased light touch    Cervical / Trunk Assessment Cervical / Trunk Assessment: Kyphotic  Communication   Communication Communication: Impaired Factors Affecting Communication: Difficulty expressing self (word finding difficulties.)    Cognition Arousal: Alert Behavior During Therapy: WFL for tasks assessed/performed   PT - Cognitive impairments: History of cognitive impairments                         Following commands: Intact       Cueing Cueing Techniques: Verbal cues     General Comments General comments (skin integrity, edema, etc.): Daughter present, supportive.    Exercises     Assessment/Plan    PT Assessment Patient needs continued PT services  PT Problem List Decreased activity tolerance;Decreased balance;Decreased mobility;Decreased coordination;Decreased knowledge of use of DME;Impaired sensation       PT Treatment Interventions DME instruction;Gait training;Stair training;Functional mobility training;Therapeutic activities;Therapeutic exercise;Balance training;Neuromuscular re-education;Patient/family education    PT Goals (Current goals can be found in the Care Plan section)  Acute Rehab PT  Goals Patient Stated Goal: Get well go home PT Goal Formulation: With patient/family Time For Goal Achievement: 08/18/23 Potential to Achieve Goals: Good    Frequency Min 2X/week     Co-evaluation                AM-PAC PT "6 Clicks" Mobility  Outcome Measure Help needed turning from your back to your side while in a flat bed without using bedrails?: None Help needed moving from lying on your back to sitting on the side of a flat bed without using bedrails?: None Help needed moving to and from a bed to a chair (including a wheelchair)?: None Help needed standing up from a chair using your arms (e.g., wheelchair or bedside chair)?: None Help needed to walk in hospital room?: A Little Help needed climbing 3-5 steps with a railing? : A Little 6 Click Score: 22    End of Session Equipment Utilized During Treatment: Gait belt Activity Tolerance: Patient tolerated treatment well Patient left: in bed;with call bell/phone within reach;with bed alarm set;with family/visitor present   PT Visit Diagnosis: Unsteadiness on feet (R26.81);Other abnormalities of gait and mobility (R26.89);History of falling (Z91.81);Hemiplegia and hemiparesis Hemiplegia - Right/Left: Right Hemiplegia - dominant/non-dominant: Dominant Hemiplegia - caused by: Cerebral infarction    Time: 8119-1478 PT Time Calculation (min) (ACUTE ONLY): 18 min   Charges:   PT Evaluation $PT Eval Low Complexity: 1 Low   PT General Charges $$ ACUTE PT VISIT: 1 Visit         Jory Ng, PT, DPT [facility]  Rehabilitation Services Physical Therapist Office: [phone] Website: [email or web address]   Alinda Irani 08/05/2023, 9:08 AM

## 2023-08-05 NOTE — Discharge Instructions (Signed)
 Follow with Primary MD Moon, Amy A, NP in 7 days   Get CBC, CMP, 2 view Chest X ray -  checked next visit with your primary MD   Activity: As tolerated with Full fall precautions use walker/cane & assistance as needed  Disposition Home     Diet: Heart Healthy     Special Instructions: If you have smoked or chewed Tobacco  in the last 2 yrs please stop smoking, stop any regular Alcohol  and or any Recreational drug use.  On your next visit with your primary care physician please Get Medicines reviewed and adjusted.  Please request your Prim.MD to go over all Hospital Tests and Procedure/Radiological results at the follow up, please get all Hospital records sent to your Prim MD by signing hospital release before you go home.  If you experience worsening of your admission symptoms, develop shortness of breath, life threatening emergency, suicidal or homicidal thoughts you must seek medical attention immediately by calling 911 or calling your MD immediately  if symptoms less severe.  You Must read complete instructions/literature along with all the possible adverse reactions/side effects for all the Medicines you take and that have been prescribed to you. Take any new Medicines after you have completely understood and accpet all the possible adverse reactions/side effects.   Do not drive when taking Pain medications.  Do not take more than prescribed Pain, Sleep and Anxiety Medications  Wear Seat belts while driving.

## 2023-08-05 NOTE — TOC Transition Note (Signed)
 Transition of Care [facility]) - Discharge Note   Patient Details  Name: Cassandra Brooks MRN: [id number] Date of Birth: [birth year]  Transition of Care [facility]) CM/SW Contact:  Omie Bickers, RN Phone Number: 08/05/2023, 9:18 AM   Clinical Narrative:     Called room and spoke w patient's daughter. They would like [facility] for HH. I have reached out to [place] and provided referral.  She states the patient has RW at home as well as cane. They would liketo have a WC. This has been ordered. They wouuld like it delivered to the house, and are OK with it arriving around Tuesday. This has been ordred to [employer] with delivery address of [address] in [place] [address].  No other TOC needs identified for DC   Final next level of care: Home w Home Health Services Barriers to Discharge: No Barriers Identified   Patient Goals and CMS Choice Patient states their goals for this hospitalization and ongoing recovery are:: to go home CMS Medicare.gov Compare Post Acute Care list provided to:: Other (Comment Required) Choice offered to / list presented to : Adult Children      Discharge Placement                       Discharge Plan and Services Additional resources added to the After Visit Summary for                  DME Arranged: Lightweight manual wheelchair with seat cushion DME Agency: [employer] Date DME Agency Contacted: 08/05/23 Time DME Agency Contacted: [phone] Representative spoke with at DME Agency: Zula Hitch HH Arranged: PT, Speech Therapy HH Agency: [facility] Date [facility] Agency Contacted: 08/05/23 Time HH Agency Contacted: [phone] Representative spoke with at [facility] Agency: Baruch Likens  Social Drivers of Health (SDOH) Interventions SDOH Screenings   Food Insecurity: No Food Insecurity (08/04/2023)  Housing: Low Risk  (08/04/2023)  Transportation Needs: No Transportation Needs (08/04/2023)  Utilities: Not At Risk (08/04/2023)  Social Connections: Socially  Integrated (08/04/2023)  Tobacco Use: Low Risk  (08/03/2023)     Readmission Risk Interventions     No data to display

## 2023-08-10 DIAGNOSIS — I69320 Aphasia following cerebral infarction: Secondary | ICD-10-CM | POA: Diagnosis not present

## 2023-08-10 DIAGNOSIS — M797 Fibromyalgia: Secondary | ICD-10-CM | POA: Diagnosis not present

## 2023-08-10 DIAGNOSIS — Z7902 Long term (current) use of antithrombotics/antiplatelets: Secondary | ICD-10-CM | POA: Diagnosis not present

## 2023-08-10 DIAGNOSIS — R32 Unspecified urinary incontinence: Secondary | ICD-10-CM | POA: Diagnosis not present

## 2023-08-10 DIAGNOSIS — I639 Cerebral infarction, unspecified: Secondary | ICD-10-CM | POA: Diagnosis not present

## 2023-08-10 DIAGNOSIS — E1136 Type 2 diabetes mellitus with diabetic cataract: Secondary | ICD-10-CM | POA: Diagnosis not present

## 2023-08-10 DIAGNOSIS — I10 Essential (primary) hypertension: Secondary | ICD-10-CM | POA: Diagnosis not present

## 2023-08-10 DIAGNOSIS — K219 Gastro-esophageal reflux disease without esophagitis: Secondary | ICD-10-CM | POA: Diagnosis not present

## 2023-08-10 DIAGNOSIS — E782 Mixed hyperlipidemia: Secondary | ICD-10-CM | POA: Diagnosis not present

## 2023-08-10 DIAGNOSIS — Z96652 Presence of left artificial knee joint: Secondary | ICD-10-CM | POA: Diagnosis not present

## 2023-08-10 DIAGNOSIS — H409 Unspecified glaucoma: Secondary | ICD-10-CM | POA: Diagnosis not present

## 2023-08-10 DIAGNOSIS — M48062 Spinal stenosis, lumbar region with neurogenic claudication: Secondary | ICD-10-CM | POA: Diagnosis not present

## 2023-08-10 DIAGNOSIS — Z9181 History of falling: Secondary | ICD-10-CM | POA: Diagnosis not present

## 2023-08-10 DIAGNOSIS — I2510 Atherosclerotic heart disease of native coronary artery without angina pectoris: Secondary | ICD-10-CM | POA: Diagnosis not present

## 2023-08-10 DIAGNOSIS — M5126 Other intervertebral disc displacement, lumbar region: Secondary | ICD-10-CM | POA: Diagnosis not present

## 2023-08-10 DIAGNOSIS — F32A Depression, unspecified: Secondary | ICD-10-CM | POA: Diagnosis not present

## 2023-08-10 DIAGNOSIS — Z7982 Long term (current) use of aspirin: Secondary | ICD-10-CM | POA: Diagnosis not present

## 2023-08-10 DIAGNOSIS — Z604 Social exclusion and rejection: Secondary | ICD-10-CM | POA: Diagnosis not present

## 2023-08-10 DIAGNOSIS — I69351 Hemiplegia and hemiparesis following cerebral infarction affecting right dominant side: Secondary | ICD-10-CM | POA: Diagnosis not present

## 2023-08-10 DIAGNOSIS — I252 Old myocardial infarction: Secondary | ICD-10-CM | POA: Diagnosis not present

## 2023-08-10 DIAGNOSIS — Z556 Problems related to health literacy: Secondary | ICD-10-CM | POA: Diagnosis not present

## 2023-08-11 DIAGNOSIS — I10 Essential (primary) hypertension: Secondary | ICD-10-CM | POA: Diagnosis not present

## 2023-08-11 DIAGNOSIS — R7303 Prediabetes: Secondary | ICD-10-CM | POA: Diagnosis not present

## 2023-08-11 DIAGNOSIS — Z9181 History of falling: Secondary | ICD-10-CM | POA: Diagnosis not present

## 2023-08-11 DIAGNOSIS — K219 Gastro-esophageal reflux disease without esophagitis: Secondary | ICD-10-CM | POA: Diagnosis not present

## 2023-08-11 DIAGNOSIS — E785 Hyperlipidemia, unspecified: Secondary | ICD-10-CM | POA: Diagnosis not present

## 2023-08-11 DIAGNOSIS — I2510 Atherosclerotic heart disease of native coronary artery without angina pectoris: Secondary | ICD-10-CM | POA: Diagnosis not present

## 2023-08-11 DIAGNOSIS — F411 Generalized anxiety disorder: Secondary | ICD-10-CM | POA: Diagnosis not present

## 2023-08-11 DIAGNOSIS — I679 Cerebrovascular disease, unspecified: Secondary | ICD-10-CM | POA: Diagnosis not present

## 2023-08-11 DIAGNOSIS — F325 Major depressive disorder, single episode, in full remission: Secondary | ICD-10-CM | POA: Diagnosis not present

## 2023-08-11 DIAGNOSIS — Z79899 Other long term (current) drug therapy: Secondary | ICD-10-CM | POA: Diagnosis not present

## 2023-08-11 DIAGNOSIS — M1990 Unspecified osteoarthritis, unspecified site: Secondary | ICD-10-CM | POA: Diagnosis not present

## 2023-08-14 DIAGNOSIS — I69351 Hemiplegia and hemiparesis following cerebral infarction affecting right dominant side: Secondary | ICD-10-CM | POA: Diagnosis not present

## 2023-08-14 DIAGNOSIS — I252 Old myocardial infarction: Secondary | ICD-10-CM | POA: Diagnosis not present

## 2023-08-14 DIAGNOSIS — I2510 Atherosclerotic heart disease of native coronary artery without angina pectoris: Secondary | ICD-10-CM | POA: Diagnosis not present

## 2023-08-14 DIAGNOSIS — I69320 Aphasia following cerebral infarction: Secondary | ICD-10-CM | POA: Diagnosis not present

## 2023-08-14 DIAGNOSIS — E1136 Type 2 diabetes mellitus with diabetic cataract: Secondary | ICD-10-CM | POA: Diagnosis not present

## 2023-08-14 DIAGNOSIS — I10 Essential (primary) hypertension: Secondary | ICD-10-CM | POA: Diagnosis not present

## 2023-08-17 DIAGNOSIS — E1136 Type 2 diabetes mellitus with diabetic cataract: Secondary | ICD-10-CM | POA: Diagnosis not present

## 2023-08-17 DIAGNOSIS — I2510 Atherosclerotic heart disease of native coronary artery without angina pectoris: Secondary | ICD-10-CM | POA: Diagnosis not present

## 2023-08-17 DIAGNOSIS — I10 Essential (primary) hypertension: Secondary | ICD-10-CM | POA: Diagnosis not present

## 2023-08-17 DIAGNOSIS — I69351 Hemiplegia and hemiparesis following cerebral infarction affecting right dominant side: Secondary | ICD-10-CM | POA: Diagnosis not present

## 2023-08-17 DIAGNOSIS — I252 Old myocardial infarction: Secondary | ICD-10-CM | POA: Diagnosis not present

## 2023-08-17 DIAGNOSIS — I69320 Aphasia following cerebral infarction: Secondary | ICD-10-CM | POA: Diagnosis not present

## 2023-08-23 DIAGNOSIS — I69351 Hemiplegia and hemiparesis following cerebral infarction affecting right dominant side: Secondary | ICD-10-CM | POA: Diagnosis not present

## 2023-08-23 DIAGNOSIS — I10 Essential (primary) hypertension: Secondary | ICD-10-CM | POA: Diagnosis not present

## 2023-08-23 DIAGNOSIS — E1136 Type 2 diabetes mellitus with diabetic cataract: Secondary | ICD-10-CM | POA: Diagnosis not present

## 2023-08-23 DIAGNOSIS — I2510 Atherosclerotic heart disease of native coronary artery without angina pectoris: Secondary | ICD-10-CM | POA: Diagnosis not present

## 2023-08-23 DIAGNOSIS — I69320 Aphasia following cerebral infarction: Secondary | ICD-10-CM | POA: Diagnosis not present

## 2023-08-23 DIAGNOSIS — I252 Old myocardial infarction: Secondary | ICD-10-CM | POA: Diagnosis not present

## 2023-08-28 DIAGNOSIS — I10 Essential (primary) hypertension: Secondary | ICD-10-CM | POA: Diagnosis not present

## 2023-08-28 DIAGNOSIS — I69351 Hemiplegia and hemiparesis following cerebral infarction affecting right dominant side: Secondary | ICD-10-CM | POA: Diagnosis not present

## 2023-08-28 DIAGNOSIS — I252 Old myocardial infarction: Secondary | ICD-10-CM | POA: Diagnosis not present

## 2023-08-28 DIAGNOSIS — I69320 Aphasia following cerebral infarction: Secondary | ICD-10-CM | POA: Diagnosis not present

## 2023-08-28 DIAGNOSIS — I2510 Atherosclerotic heart disease of native coronary artery without angina pectoris: Secondary | ICD-10-CM | POA: Diagnosis not present

## 2023-08-28 DIAGNOSIS — E1136 Type 2 diabetes mellitus with diabetic cataract: Secondary | ICD-10-CM | POA: Diagnosis not present

## 2023-08-30 DIAGNOSIS — I10 Essential (primary) hypertension: Secondary | ICD-10-CM | POA: Diagnosis not present

## 2023-08-30 DIAGNOSIS — I2510 Atherosclerotic heart disease of native coronary artery without angina pectoris: Secondary | ICD-10-CM | POA: Diagnosis not present

## 2023-08-30 DIAGNOSIS — I69320 Aphasia following cerebral infarction: Secondary | ICD-10-CM | POA: Diagnosis not present

## 2023-08-30 DIAGNOSIS — E1136 Type 2 diabetes mellitus with diabetic cataract: Secondary | ICD-10-CM | POA: Diagnosis not present

## 2023-08-30 DIAGNOSIS — I252 Old myocardial infarction: Secondary | ICD-10-CM | POA: Diagnosis not present

## 2023-08-30 DIAGNOSIS — I69351 Hemiplegia and hemiparesis following cerebral infarction affecting right dominant side: Secondary | ICD-10-CM | POA: Diagnosis not present

## 2023-09-06 DIAGNOSIS — I10 Essential (primary) hypertension: Secondary | ICD-10-CM | POA: Diagnosis not present

## 2023-09-06 DIAGNOSIS — I69351 Hemiplegia and hemiparesis following cerebral infarction affecting right dominant side: Secondary | ICD-10-CM | POA: Diagnosis not present

## 2023-09-06 DIAGNOSIS — I252 Old myocardial infarction: Secondary | ICD-10-CM | POA: Diagnosis not present

## 2023-09-06 DIAGNOSIS — I69320 Aphasia following cerebral infarction: Secondary | ICD-10-CM | POA: Diagnosis not present

## 2023-09-06 DIAGNOSIS — E1136 Type 2 diabetes mellitus with diabetic cataract: Secondary | ICD-10-CM | POA: Diagnosis not present

## 2023-09-06 DIAGNOSIS — I2510 Atherosclerotic heart disease of native coronary artery without angina pectoris: Secondary | ICD-10-CM | POA: Diagnosis not present

## 2023-09-08 DIAGNOSIS — I252 Old myocardial infarction: Secondary | ICD-10-CM | POA: Diagnosis not present

## 2023-09-08 DIAGNOSIS — I10 Essential (primary) hypertension: Secondary | ICD-10-CM | POA: Diagnosis not present

## 2023-09-08 DIAGNOSIS — I69320 Aphasia following cerebral infarction: Secondary | ICD-10-CM | POA: Diagnosis not present

## 2023-09-08 DIAGNOSIS — I2510 Atherosclerotic heart disease of native coronary artery without angina pectoris: Secondary | ICD-10-CM | POA: Diagnosis not present

## 2023-09-08 DIAGNOSIS — E1136 Type 2 diabetes mellitus with diabetic cataract: Secondary | ICD-10-CM | POA: Diagnosis not present

## 2023-09-08 DIAGNOSIS — I69351 Hemiplegia and hemiparesis following cerebral infarction affecting right dominant side: Secondary | ICD-10-CM | POA: Diagnosis not present

## 2023-09-09 DIAGNOSIS — Z7902 Long term (current) use of antithrombotics/antiplatelets: Secondary | ICD-10-CM | POA: Diagnosis not present

## 2023-09-09 DIAGNOSIS — I639 Cerebral infarction, unspecified: Secondary | ICD-10-CM | POA: Diagnosis not present

## 2023-09-09 DIAGNOSIS — M48062 Spinal stenosis, lumbar region with neurogenic claudication: Secondary | ICD-10-CM | POA: Diagnosis not present

## 2023-09-09 DIAGNOSIS — R32 Unspecified urinary incontinence: Secondary | ICD-10-CM | POA: Diagnosis not present

## 2023-09-09 DIAGNOSIS — M5126 Other intervertebral disc displacement, lumbar region: Secondary | ICD-10-CM | POA: Diagnosis not present

## 2023-09-09 DIAGNOSIS — M797 Fibromyalgia: Secondary | ICD-10-CM | POA: Diagnosis not present

## 2023-09-09 DIAGNOSIS — F32A Depression, unspecified: Secondary | ICD-10-CM | POA: Diagnosis not present

## 2023-09-09 DIAGNOSIS — Z7982 Long term (current) use of aspirin: Secondary | ICD-10-CM | POA: Diagnosis not present

## 2023-09-09 DIAGNOSIS — E782 Mixed hyperlipidemia: Secondary | ICD-10-CM | POA: Diagnosis not present

## 2023-09-09 DIAGNOSIS — Z604 Social exclusion and rejection: Secondary | ICD-10-CM | POA: Diagnosis not present

## 2023-09-09 DIAGNOSIS — I252 Old myocardial infarction: Secondary | ICD-10-CM | POA: Diagnosis not present

## 2023-09-09 DIAGNOSIS — K219 Gastro-esophageal reflux disease without esophagitis: Secondary | ICD-10-CM | POA: Diagnosis not present

## 2023-09-09 DIAGNOSIS — Z9181 History of falling: Secondary | ICD-10-CM | POA: Diagnosis not present

## 2023-09-09 DIAGNOSIS — E1136 Type 2 diabetes mellitus with diabetic cataract: Secondary | ICD-10-CM | POA: Diagnosis not present

## 2023-09-09 DIAGNOSIS — I2510 Atherosclerotic heart disease of native coronary artery without angina pectoris: Secondary | ICD-10-CM | POA: Diagnosis not present

## 2023-09-09 DIAGNOSIS — I69351 Hemiplegia and hemiparesis following cerebral infarction affecting right dominant side: Secondary | ICD-10-CM | POA: Diagnosis not present

## 2023-09-09 DIAGNOSIS — Z96652 Presence of left artificial knee joint: Secondary | ICD-10-CM | POA: Diagnosis not present

## 2023-09-09 DIAGNOSIS — I69320 Aphasia following cerebral infarction: Secondary | ICD-10-CM | POA: Diagnosis not present

## 2023-09-09 DIAGNOSIS — Z556 Problems related to health literacy: Secondary | ICD-10-CM | POA: Diagnosis not present

## 2023-09-09 DIAGNOSIS — I10 Essential (primary) hypertension: Secondary | ICD-10-CM | POA: Diagnosis not present

## 2023-09-09 DIAGNOSIS — H409 Unspecified glaucoma: Secondary | ICD-10-CM | POA: Diagnosis not present

## 2023-09-13 DIAGNOSIS — I2510 Atherosclerotic heart disease of native coronary artery without angina pectoris: Secondary | ICD-10-CM | POA: Diagnosis not present

## 2023-09-13 DIAGNOSIS — E1136 Type 2 diabetes mellitus with diabetic cataract: Secondary | ICD-10-CM | POA: Diagnosis not present

## 2023-09-13 DIAGNOSIS — I252 Old myocardial infarction: Secondary | ICD-10-CM | POA: Diagnosis not present

## 2023-09-13 DIAGNOSIS — I69351 Hemiplegia and hemiparesis following cerebral infarction affecting right dominant side: Secondary | ICD-10-CM | POA: Diagnosis not present

## 2023-09-13 DIAGNOSIS — I69320 Aphasia following cerebral infarction: Secondary | ICD-10-CM | POA: Diagnosis not present

## 2023-09-13 DIAGNOSIS — I10 Essential (primary) hypertension: Secondary | ICD-10-CM | POA: Diagnosis not present

## 2023-09-19 ENCOUNTER — Telehealth: Payer: Self-pay | Admitting: Gastroenterology

## 2023-09-19 DIAGNOSIS — Z1231 Encounter for screening mammogram for malignant neoplasm of breast: Secondary | ICD-10-CM | POA: Diagnosis not present

## 2023-09-19 NOTE — Telephone Encounter (Signed)
 Hi Dr. Venice Gillis,   This patient has requested a transfer of care to you.  Her husband sees you as well.  Her husband states she really doesn't have a GI history and only went for one consultation in February of this year and saw Dr. Manya Sells in [place].  For any issues she's been having, she's been seeing her PCP, Amy Moon, who recommended that she see you as well.  Since she did see Dr. Manya Sells back in February, we need to do a Transfer of Care.  Please let me know if you will accept this patient into your care.  Thank you.

## 2023-09-19 NOTE — Telephone Encounter (Signed)
 OK for APP clinic or mine Please get records from [facility] as well RG

## 2023-09-20 DIAGNOSIS — I69351 Hemiplegia and hemiparesis following cerebral infarction affecting right dominant side: Secondary | ICD-10-CM | POA: Diagnosis not present

## 2023-09-20 DIAGNOSIS — I69320 Aphasia following cerebral infarction: Secondary | ICD-10-CM | POA: Diagnosis not present

## 2023-09-20 DIAGNOSIS — I252 Old myocardial infarction: Secondary | ICD-10-CM | POA: Diagnosis not present

## 2023-09-20 DIAGNOSIS — I10 Essential (primary) hypertension: Secondary | ICD-10-CM | POA: Diagnosis not present

## 2023-09-20 DIAGNOSIS — E1136 Type 2 diabetes mellitus with diabetic cataract: Secondary | ICD-10-CM | POA: Diagnosis not present

## 2023-09-20 DIAGNOSIS — I2510 Atherosclerotic heart disease of native coronary artery without angina pectoris: Secondary | ICD-10-CM | POA: Diagnosis not present

## 2023-09-25 DIAGNOSIS — I69351 Hemiplegia and hemiparesis following cerebral infarction affecting right dominant side: Secondary | ICD-10-CM | POA: Diagnosis not present

## 2023-09-25 DIAGNOSIS — I69320 Aphasia following cerebral infarction: Secondary | ICD-10-CM | POA: Diagnosis not present

## 2023-09-25 DIAGNOSIS — E1136 Type 2 diabetes mellitus with diabetic cataract: Secondary | ICD-10-CM | POA: Diagnosis not present

## 2023-09-25 DIAGNOSIS — I252 Old myocardial infarction: Secondary | ICD-10-CM | POA: Diagnosis not present

## 2023-09-25 DIAGNOSIS — R197 Diarrhea, unspecified: Secondary | ICD-10-CM | POA: Diagnosis not present

## 2023-09-25 DIAGNOSIS — I2510 Atherosclerotic heart disease of native coronary artery without angina pectoris: Secondary | ICD-10-CM | POA: Diagnosis not present

## 2023-09-25 DIAGNOSIS — I10 Essential (primary) hypertension: Secondary | ICD-10-CM | POA: Diagnosis not present

## 2023-09-25 NOTE — Telephone Encounter (Signed)
 PT has received records. Will bring them in on her appointment date.

## 2023-10-02 DIAGNOSIS — I2510 Atherosclerotic heart disease of native coronary artery without angina pectoris: Secondary | ICD-10-CM | POA: Diagnosis not present

## 2023-10-02 DIAGNOSIS — E1136 Type 2 diabetes mellitus with diabetic cataract: Secondary | ICD-10-CM | POA: Diagnosis not present

## 2023-10-02 DIAGNOSIS — I252 Old myocardial infarction: Secondary | ICD-10-CM | POA: Diagnosis not present

## 2023-10-02 DIAGNOSIS — I10 Essential (primary) hypertension: Secondary | ICD-10-CM | POA: Diagnosis not present

## 2023-10-02 DIAGNOSIS — I69351 Hemiplegia and hemiparesis following cerebral infarction affecting right dominant side: Secondary | ICD-10-CM | POA: Diagnosis not present

## 2023-10-02 DIAGNOSIS — I69320 Aphasia following cerebral infarction: Secondary | ICD-10-CM | POA: Diagnosis not present

## 2023-10-14 ENCOUNTER — Other Ambulatory Visit: Payer: Self-pay | Admitting: Cardiology

## 2023-10-23 ENCOUNTER — Ambulatory Visit (INDEPENDENT_AMBULATORY_CARE_PROVIDER_SITE_OTHER)
Admission: RE | Admit: 2023-10-23 | Discharge: 2023-10-23 | Disposition: A | Discharge status: Home / Self Care | Source: Ambulatory Visit | Attending: Gastroenterology | Admitting: Gastroenterology

## 2023-10-23 ENCOUNTER — Ambulatory Visit (INDEPENDENT_AMBULATORY_CARE_PROVIDER_SITE_OTHER): Admitting: Gastroenterology

## 2023-10-23 ENCOUNTER — Encounter: Payer: Self-pay | Admitting: Gastroenterology

## 2023-10-23 VITALS — BP 132/72 | HR 76 | Ht 61.0 in | Wt 164.0 lb

## 2023-10-23 DIAGNOSIS — K529 Noninfective gastroenteritis and colitis, unspecified: Secondary | ICD-10-CM | POA: Diagnosis not present

## 2023-10-23 DIAGNOSIS — R197 Diarrhea, unspecified: Secondary | ICD-10-CM

## 2023-10-23 DIAGNOSIS — I639 Cerebral infarction, unspecified: Secondary | ICD-10-CM

## 2023-10-23 DIAGNOSIS — Z800 Family history of malignant neoplasm of digestive organs: Secondary | ICD-10-CM

## 2023-10-23 DIAGNOSIS — N39490 Overflow incontinence: Secondary | ICD-10-CM | POA: Diagnosis not present

## 2023-10-23 DIAGNOSIS — R159 Full incontinence of feces: Secondary | ICD-10-CM

## 2023-10-23 DIAGNOSIS — Z7902 Long term (current) use of antithrombotics/antiplatelets: Secondary | ICD-10-CM

## 2023-10-23 NOTE — Patient Instructions (Addendum)
 _______________________________________________________  If your blood pressure at your visit was 140/90 or greater, please contact your primary care physician to follow up on this.  _______________________________________________________  If you are age 83 or older, your body mass index should be between 23-30. Your Body mass index is 30.99 kg/m. If this is out of the aforementioned range listed, please consider follow up with your Primary Care Provider.  If you are age 55 or younger, your body mass index should be between 19-25. Your Body mass index is 30.99 kg/m. If this is out of the aformentioned range listed, please consider follow up with your Primary Care Provider.   ________________________________________________________  The [place] [facility] providers would like to encourage you to use MYCHART to communicate with providers for non-urgent requests or questions.  Due to long hold times on the telephone, sending your provider a message by [facility] may be a faster and more efficient way to get a response.  Please allow 48 business hours for a response.  Please remember that this is for non-urgent requests.  _______________________________________________________  Please follow up in 6 months. Give us  a call at [phone] to schedule an appointment.  Your provider has requested that you have an abdominal x ray before leaving today. Please go to the basement floor to our Radiology department for the test.  Your provider has ordered [employer] stool testing for you. You have received a kit from our office today containing all necessary supplies to complete this test. Please carefully read the stool collection instructions provided in the kit before opening the accompanying materials. In addition, be sure there is a label providing your full name and date of birth on the puritan opti-swab tube that is supplied in the kit (if you do not see a label with this information on your test tube, please  make us  aware before test collection!). After completing the test, you should secure the purtian tube into the specimen biohazard bag. The [facility] E-Req sheet (including date and time of specimen collection) should be placed into the outside pocket of the specimen biohazard bag and returned to the [place] lab (basement floor of [facility]) within 3 days of collection. Please make sure to give the specimen to a staff member at the lab. DO NOT leave the specimen on the counter.   If the specimen date and time (can be found in the upper right boxed portion of the sheet) are not filled out on the E-Req sheet, the test will NOT be performed.   Kegel Exercises  Kegel exercises can help strengthen your pelvic floor muscles. The pelvic floor is a group of muscles that support your rectum, small intestine, and bladder. In females, pelvic floor muscles also help support the uterus. These muscles help you control the flow of urine and stool (feces). Kegel exercises are painless and simple. They do not require any equipment. Your provider may suggest Kegel exercises to: Improve bladder and bowel control. Improve sexual response. Improve weak pelvic floor muscles after surgery to remove the uterus (hysterectomy) or after pregnancy, in females. Improve weak pelvic floor muscles after prostate gland removal or surgery, in males. Kegel exercises involve squeezing your pelvic floor muscles. These are the same muscles you squeeze when you try to stop the flow of urine or keep from passing gas. The exercises can be done while sitting, standing, or lying down, but it is best to vary your position. Ask your health care provider which exercises are safe for you.  Do exercises exactly as told by your health care provider and adjust them as directed. Do not begin these exercises until told by your health care provider. Exercises How to do Kegel exercises: Squeeze your pelvic floor muscles  tight. You should feel a tight lift in your rectal area. If you are a female, you should also feel a tightness in your vaginal area. Keep your stomach, buttocks, and legs relaxed. Hold the muscles tight for up to 10 seconds. Breathe normally. Relax your muscles for up to 10 seconds. Repeat as told by your health care provider. Repeat this exercise daily as told by your health care provider. Continue to do this exercise for at least 4-6 weeks, or for as long as told by your health care provider. You may be referred to a physical therapist who can help you learn more about how to do Kegel exercises. Depending on your condition, your health care provider may recommend: Varying how long you squeeze your muscles. Doing several sets of exercises every day. Doing exercises for several weeks. Making Kegel exercises a part of your regular exercise routine. This information is not intended to replace advice given to you by your health care provider. Make sure you discuss any questions you have with your health care provider. Document Revised: 08/06/2020 Document Reviewed: 08/06/2020 Elsevier Patient Education  2024 [employer].

## 2023-10-23 NOTE — Progress Notes (Signed)
 Chief Complaint:   Referring Provider:  Erick Greig LABOR, NP      ASSESSMENT AND PLAN;   #1. Diarrhea with incontinnce- ?overflow  #2. Strong FH CRC- dad/brother. Neg colon 2018.  #3. Recent CVA (08/01/2023) on ASA/plavix . Has appt with Dr Rosemarie (neuro)  Plan: -X-ray KUB 2 V -Stool for GI pathogens -Hold of on colon -Kegels excercises. -No colon for now - recent CVA -FU in 6-8 months.  Then would consider, after neurology clearance and holding Plavix .  Certainly, earlier if there is any red flags. -D/W pt and husband in detail   HPI:    Cassandra Brooks is a 83 y.o. female  With CVA (08/01/2023) on ASA/plavix , DM2, AAA, CAD s/p MI 07/2022, HTN , GERD, fibromyalgia HLD, Depression, postherpetic neuralgia History of Present Illness An 83 year old female with a history of stroke presents with chronic diarrhea and bowel incontinence. She was referred by Amy for evaluation of her chronic diarrhea and bowel incontinence.  She has experienced diarrhea for over a year, with bowel incontinence developing in the past few months. This has led to frequent accidents before reaching the bathroom, significantly impacting her daily life. No abdominal pain or cramps are present, and there is no significant blood in her stool, except for one instance of fresh blood likely from a hemorrhoid.  She has tried treatments such as Fibercon and Imodium, but these have not been effective. Initially, half a dose of Imodium provided some relief, but now even a full dose does not help.  Her bowel issues have persisted since before her stroke in April, which affected her speech. She underwent speech therapy but still struggles with aphasia. She is currently on Plavix  and aspirin  following her stroke and a previous heart attack. She also takes omeprazole for acid reflux and a probiotic and fiber supplement.  Her family history is significant for colon cancer in her father and brother, breast cancer in her sister,  and diabetes in her family, though she does not have diabetes herself.  She reports watery diarrhea and occasional urinary incontinence.  Past GI workup: Had multiple colonoscopies.  Last colonoscopy by Dr. Towana 11/02/2016: Few diverticula in the sigmoid colon.  Otherwise normal to cecum.  No follow-up due to age. Past Medical History:  Diagnosis Date   AAA (abdominal aortic aneurysm) (HCC)    ACS (acute coronary syndrome) (HCC) 08/01/2022   Arthritis    knees (03/21/2017)   Borderline diabetes    Bulging lumbar disc    CAD (coronary artery disease) 08/17/2022   Depression    Diabetes mellitus due to underlying condition with unspecified complications (HCC) 12/19/2019   Disequilibrium    Dizziness    Essential hypertension 12/19/2019   Fall    Fibromyalgia    GERD (gastroesophageal reflux disease)    Glaucoma, both eyes    Hemoptysis 03/18/2015   Hyperlipidemia    don't have it but I take RX (03/21/2017)   Hypertension    Mixed dyslipidemia 12/19/2019   Mixed hyperlipidemia 04/18/2017   Nuclear cataract of both eyes 01/01/2014   Plantar fasciitis 10/21/2020   Posterior tibial tendon dysfunction (PTTD) of right lower extremity 11/16/2021   Pre-diabetes    pt states she is not diabetic. Last A1C was normal. Pt is on no medications for DM   Preoperative cardiovascular examination 12/19/2019   Primary osteoarthritis of left foot 10/21/2020   Primary osteoarthritis of left knee 07/18/2019   Solitary pulmonary nodule 03/18/2015   03/2015 6mm RLL nodule  Spinal stenosis of lumbar region with neurogenic claudication 04/26/2021   Spinal stenosis, lumbar region with neurogenic claudication 04/26/2021   Central stenosis, L2-3 and L3-4 with moderate foramenal narrowing L4-5   Status post AAA (abdominal aortic aneurysm) repair 12/19/2019   Status post total left knee replacement 03/27/2020   STEMI (ST elevation myocardial infarction) (HCC) 08/01/2022   STEMI involving right  coronary artery (HCC) 07/29/2022   Syncope 03/21/2017   Syncope and collapse     Past Surgical History:  Procedure Laterality Date   ABDOMINAL AORTIC ANEURYSM REPAIR  1966   APPENDECTOMY  1966   CARDIAC CATHETERIZATION     20+ years ago, no abnormalities found   COLONOSCOPY     CORONARY/GRAFT ACUTE MI REVASCULARIZATION N/A 07/29/2022   Procedure: Coronary/Graft Acute MI Revascularization;  Surgeon: Burnard Debby LABOR, MD;  Location: [facility];  Service: Cardiovascular;  Laterality: N/A;   LEFT HEART CATH AND CORONARY ANGIOGRAPHY N/A 07/29/2022   Procedure: LEFT HEART CATH AND CORONARY ANGIOGRAPHY;  Surgeon: Burnard Debby LABOR, MD;  Location: [facility] CV [facility];  Service: Cardiovascular;  Laterality: N/A;   LUMBAR LAMINECTOMY/DECOMPRESSION MICRODISCECTOMY N/A 04/26/2021   Procedure: CENTRAL LAMINECTOMIES LUMBAR TWO-THREE,  LUMBAR THREE-FOUR WITH BILATERAL FORAMINOTOMIES;  Surgeon: Lucilla Lynwood BRAVO, MD;  Location: [facility] OR;  Service: Orthopedics;  Laterality: N/A;   TOTAL KNEE ARTHROPLASTY Left 03/27/2020   Procedure: LEFT TOTAL KNEE ARTHROPLASTY;  Surgeon: Jerri Kay HERO, MD;  Location: [facility] OR;  Service: Orthopedics;  Laterality: Left;   VAGINAL HYSTERECTOMY     partial   VIDEO BRONCHOSCOPY Bilateral 03/19/2015   Procedure: VIDEO BRONCHOSCOPY WITHOUT FLUORO;  Surgeon: Harden Jude GAILS, MD;  Location: [facility];  Service: Cardiopulmonary;  Laterality: Bilateral;    Family History  Problem Relation Age of Onset   Hypertension Father    Stomach cancer Father    Glaucoma Mother    Diabetes Mother    Diabetes Sister    Diabetes Brother    Lupus Child    Diabetes Brother    Lung cancer Brother    Colon cancer Brother     Social History   Tobacco Use   Smoking status: Never   Smokeless tobacco: Never  Vaping Use   Vaping status: Never Used  Substance Use Topics   Alcohol use: No   Drug use: No    Current Outpatient Medications  Medication Sig Dispense Refill    acetaminophen  (TYLENOL ) 650 MG CR tablet Take 650 mg by mouth 2 (two) times daily.     amLODipine  (NORVASC ) 5 MG tablet Take 1 tablet (5 mg total) by mouth daily.     amoxicillin  (AMOXIL ) 500 MG tablet take 4 pills one hour prior to dental work 4 tablet 2   aspirin  EC 81 MG tablet Take 1 tablet (81 mg total) by mouth at bedtime.     atorvastatin  (LIPITOR ) 80 MG tablet Take 1 tablet (80 mg total) by mouth daily. 90 tablet 1   clopidogrel  (PLAVIX ) 75 MG tablet Take 1 tablet (75 mg total) by mouth daily. 30 tablet 1   diclofenac  Sodium (VOLTAREN ) 1 % GEL Apply 2 g topically 4 (four) times daily. (Patient taking differently: Apply 2 g topically 4 (four) times daily as needed (pain).) 150 g 2   fluticasone  (FLONASE ) 50 MCG/ACT nasal spray Place 2 sprays into both nostrils daily.     hydrochlorothiazide  (HYDRODIURIL ) 12.5 MG tablet Take 12.5 mg by mouth daily.     LUMIGAN 0.01 % SOLN Place 1 drop  into both eyes at bedtime.     metoprolol  tartrate (LOPRESSOR ) 25 MG tablet Take 1 tablet (25 mg total) by mouth 2 (two) times daily. 180 tablet 3   Multiple Vitamins-Minerals (MULTIVITAMIN WOMEN 50+) TABS Take 1 tablet by mouth daily.     nabumetone  (RELAFEN ) 750 MG tablet Take 750 mg by mouth 2 (two) times daily.  4   nitroGLYCERIN  (NITROSTAT ) 0.4 MG SL tablet Place 0.4 mg under the tongue every 5 (five) minutes as needed for chest pain.     pantoprazole  (PROTONIX ) 40 MG tablet Take 1 tablet (40 mg total) by mouth daily. 30 tablet 0   valsartan  (DIOVAN ) 160 MG tablet Take 1 tablet (160 mg total) by mouth at bedtime.     venlafaxine  XR (EFFEXOR -XR) 150 MG 24 hr capsule Take 1 capsule (150 mg total) by mouth daily with breakfast. 30 capsule 0   dorzolamide -timolol  (COSOPT ) 22.3-6.8 MG/ML ophthalmic solution Place 1 drop into both eyes 2 (two) times daily. (Patient not taking: Reported on 10/23/2023)     No current facility-administered medications for this visit.    Allergies  Allergen Reactions   Zestril   [Lisinopril ] Cough    Review of Systems:  Has anxiety/depression.     Physical Exam:    BP 132/72   Pulse 76   Ht 5' 1 (1.549 m)   Wt 164 lb (74.4 kg)   BMI 30.99 kg/m  Wt Readings from Last 3 Encounters:  10/23/23 164 lb (74.4 kg)  08/03/23 167 lb (75.8 kg)  09/29/22 167 lb 9.6 oz (76 kg)   Constitutional:  Well-developed, in no acute distress. Psychiatric: Normal mood and affect. Behavior is normal. HEENT: Conjunctivae are normal. No scleral icterus. Cardiovascular: Normal rate, regular rhythm. No edema Pulmonary/chest: Effort normal and breath sounds normal. No wheezing, rales or rhonchi. Abdominal: Soft, nondistended. Nontender. Bowel sounds active throughout. There are no masses palpable. No hepatomegaly. Rectal: Deferred Neurological: Alert and oriented to person place and time.  Has aphasia. Skin: Skin is warm and dry. No rashes noted.  Data Reviewed: I have personally reviewed following labs and imaging studies  CBC:    Latest Ref Rng & Units 08/05/2023    4:35 AM 08/04/2023    2:01 AM 08/03/2023   11:29 AM  CBC  WBC 4.0 - 10.5 K/uL 4.1  5.3    Hemoglobin 12.0 - 15.0 g/dL 86.8  87.4  87.3   Hematocrit 36.0 - 46.0 % 38.8  37.0  37.0   Platelets 150 - 400 K/uL 156  163      CMP:    Latest Ref Rng & Units 08/05/2023    4:35 AM 08/04/2023    2:01 AM 08/03/2023   11:29 AM  CMP  Glucose 70 - 99 mg/dL 853   866   BUN 8 - 23 mg/dL 11   20   Creatinine 9.55 - 1.00 mg/dL 9.05  9.13  8.89   Sodium 135 - 145 mmol/L 144   141   Potassium 3.5 - 5.1 mmol/L 3.6   3.4   Chloride 98 - 111 mmol/L 108   108   CO2 22 - 32 mmol/L 28     Calcium  8.9 - 10.3 mg/dL 8.6     Total Protein 6.5 - 8.1 g/dL 6.5     Total Bilirubin 0.0 - 1.2 mg/dL 0.8     Alkaline Phos 38 - 126 U/L 48     AST 15 - 41 U/L 26     ALT  0 - 44 U/L 21         Anselm Bring, MD 10/23/2023, 9:58 AM  Cc: Erick Greig LABOR, NP

## 2023-10-24 ENCOUNTER — Ambulatory Visit: Payer: Self-pay | Admitting: Gastroenterology

## 2023-10-24 DIAGNOSIS — Z79899 Other long term (current) drug therapy: Secondary | ICD-10-CM | POA: Diagnosis not present

## 2023-10-24 DIAGNOSIS — E785 Hyperlipidemia, unspecified: Secondary | ICD-10-CM | POA: Diagnosis not present

## 2023-10-24 DIAGNOSIS — Z6830 Body mass index (BMI) 30.0-30.9, adult: Secondary | ICD-10-CM | POA: Diagnosis not present

## 2023-10-24 DIAGNOSIS — F411 Generalized anxiety disorder: Secondary | ICD-10-CM | POA: Diagnosis not present

## 2023-10-24 DIAGNOSIS — R7303 Prediabetes: Secondary | ICD-10-CM | POA: Diagnosis not present

## 2023-10-24 DIAGNOSIS — I10 Essential (primary) hypertension: Secondary | ICD-10-CM | POA: Diagnosis not present

## 2023-10-24 DIAGNOSIS — F325 Major depressive disorder, single episode, in full remission: Secondary | ICD-10-CM | POA: Diagnosis not present

## 2023-10-24 DIAGNOSIS — K219 Gastro-esophageal reflux disease without esophagitis: Secondary | ICD-10-CM | POA: Diagnosis not present

## 2023-10-25 ENCOUNTER — Other Ambulatory Visit: Payer: Self-pay

## 2023-10-25 DIAGNOSIS — K5900 Constipation, unspecified: Secondary | ICD-10-CM

## 2023-10-26 ENCOUNTER — Encounter: Payer: Self-pay | Admitting: Cardiology

## 2023-10-26 ENCOUNTER — Ambulatory Visit: Attending: Cardiology | Admitting: Cardiology

## 2023-10-26 VITALS — BP 124/70 | HR 66 | Ht 61.0 in | Wt 163.8 lb

## 2023-10-26 DIAGNOSIS — Z8679 Personal history of other diseases of the circulatory system: Secondary | ICD-10-CM | POA: Diagnosis not present

## 2023-10-26 DIAGNOSIS — I2510 Atherosclerotic heart disease of native coronary artery without angina pectoris: Secondary | ICD-10-CM | POA: Diagnosis not present

## 2023-10-26 DIAGNOSIS — E782 Mixed hyperlipidemia: Secondary | ICD-10-CM | POA: Insufficient documentation

## 2023-10-26 DIAGNOSIS — Z9889 Other specified postprocedural states: Secondary | ICD-10-CM | POA: Insufficient documentation

## 2023-10-26 DIAGNOSIS — E088 Diabetes mellitus due to underlying condition with unspecified complications: Secondary | ICD-10-CM | POA: Insufficient documentation

## 2023-10-26 NOTE — Patient Instructions (Signed)
 Medication Instructions:  Your physician recommends that you continue on your current medications as directed. Please refer to the Current Medication list given to you today.  *If you need a refill on your cardiac medications before your next appointment, please call your pharmacy*   Lab Work: None ordered If you have labs (blood work) drawn today and your tests are completely normal, you will receive your results only by: MyChart Message (if you have MyChart) OR A paper copy in the mail If you have any lab test that is abnormal or we need to change your treatment, we will call you to review the results.   Testing/Procedures: Your physician has requested that you have an abdominal aorta duplex. During this test, an ultrasound is used to evaluate the aorta. Allow 30 minutes for this exam. Do not eat after midnight the day before and avoid carbonated beverages.  Please note: We ask at that you not bring children with you during ultrasound (echo/ vascular) testing. Due to room size and safety concerns, children are not allowed in the ultrasound rooms during exams. Our front office staff cannot provide observation of children in our lobby area while testing is being conducted. An adult accompanying a patient to their appointment will only be allowed in the ultrasound room at the discretion of the ultrasound technician under special circumstances. We apologize for any inconvenience.    Follow-Up: At [facility], you and your health needs are our priority.  As part of our continuing mission to provide you with exceptional heart care, we have created designated Provider Care Teams.  These Care Teams include your primary Cardiologist (physician) and Advanced Practice Providers (APPs -  Physician Assistants and Nurse Practitioners) who all work together to provide you with the care you need, when you need it.  We recommend signing up for the patient portal called MyChart.  Sign up  information is provided on this After Visit Summary.  MyChart is used to connect with patients for Virtual Visits (Telemedicine).  Patients are able to view lab/test results, encounter notes, upcoming appointments, etc.  Non-urgent messages can be sent to your provider as well.   To learn more about what you can do with MyChart, go to [email or web address].    Your next appointment:   9 month(s)  The format for your next appointment:   In Person  Provider:   Jennifer Crape, MD    Other Instructions none  Important Information About Sugar

## 2023-10-26 NOTE — Progress Notes (Signed)
 Cardiology Office Note:    Date:  10/26/2023   ID:  Cassandra Brooks, DOB December 22, 1940, MRN [id number]  PCP:  Erick Greig LABOR, NP  Cardiologist:  Jennifer JONELLE Crape, MD   Referring MD: Erick Greig LABOR, NP    ASSESSMENT:    1. Coronary artery disease involving native coronary artery of native heart without angina pectoris   2. Diabetes mellitus due to underlying condition with unspecified complications (HCC)   3. Mixed dyslipidemia   4. Status post AAA (abdominal aortic aneurysm) repair    PLAN:    In order of problems listed above:  Coronary artery disease: Secondary prevention stressed with the patient.  Importance of compliance with diet medication stressed and patient verbalized standing.  She was advised to walk at least half an hour on a daily basis. Essential hypertension: Blood pressure stable and salt intake issues and diet and lifestyle modification urged Mixed dyslipidemia: On lipid-lowering medications lipids reviewed and found to be fine. History of abdominal aortic aneurysm: Will do ultrasound today to follow-up on this.  She has had surgery for this in the very remote past. Obesity: Weight reduction stressed and she promises to be referred.  Close of obesity explained. Patient will be seen in follow-up appointment in 6 months or earlier if the patient has any concerns.    Medication Adjustments/Labs and Tests Ordered: Current medicines are reviewed at length with the patient today.  Concerns regarding medicines are outlined above.  Orders Placed This Encounter  Procedures   VAS US  AAA DUPLEX   No orders of the defined types were placed in this encounter.    No chief complaint on file.    History of Present Illness:    Cassandra Brooks is a 83 y.o. female.  Patient has past medical history of coronary artery disease, essential hypertension, mixed dyslipidemia and abdominal aortic aneurysm in the remote past.  She denies any problems at this time and takes care of  activities of daily living.  No chest pain orthopnea or PND.  She has had a history of stroke.  At the time of my evaluation, the patient is alert awake oriented and in no distress.  She has had diarrhea with hypomagnesemia in the recent past.  Past Medical History:  Diagnosis Date   AAA (abdominal aortic aneurysm) (HCC)    ACS (acute coronary syndrome) (HCC) 08/01/2022   Arthritis    knees (03/21/2017)   Borderline diabetes    Bulging lumbar disc    CAD (coronary artery disease) 08/17/2022   Depression    Diabetes mellitus due to underlying condition with unspecified complications (HCC) 12/19/2019   Disequilibrium    Dizziness    Essential hypertension 12/19/2019   Fall    Fibromyalgia    GERD (gastroesophageal reflux disease)    Glaucoma, both eyes    Hemoptysis 03/18/2015   Hyperlipidemia    don't have it but I take RX (03/21/2017)   Hypertension    Mixed dyslipidemia 12/19/2019   Mixed hyperlipidemia 04/18/2017   Nuclear cataract of both eyes 01/01/2014   Obesity (BMI 30.0-34.9) 09/29/2022   Plantar fasciitis 10/21/2020   Posterior tibial tendon dysfunction (PTTD) of right lower extremity 11/16/2021   Pre-diabetes    pt states she is not diabetic. Last A1C was normal. Pt is on no medications for DM   Preoperative cardiovascular examination 12/19/2019   Primary osteoarthritis of left foot 10/21/2020   Primary osteoarthritis of left knee 07/18/2019   Solitary pulmonary nodule 03/18/2015   03/2015  6mm RLL nodule    Spinal stenosis of lumbar region with neurogenic claudication 04/26/2021   Spinal stenosis, lumbar region with neurogenic claudication 04/26/2021   Central stenosis, L2-3 and L3-4 with moderate foramenal narrowing L4-5   Status post AAA (abdominal aortic aneurysm) repair 12/19/2019   Status post total left knee replacement 03/27/2020   STEMI (ST elevation myocardial infarction) (HCC) 08/01/2022   STEMI involving right coronary artery (HCC) 07/29/2022    Syncope 03/21/2017   Syncope and collapse    TIA (transient ischemic attack) 08/03/2023    Past Surgical History:  Procedure Laterality Date   ABDOMINAL AORTIC ANEURYSM REPAIR  1966   APPENDECTOMY  1966   CARDIAC CATHETERIZATION     20+ years ago, no abnormalities found   COLONOSCOPY  11/02/2016   Dr Towana. A few diverticuli were seen in the sigmoid colon. Otherwise normal colonoscopy to cecum   CORONARY/GRAFT ACUTE MI REVASCULARIZATION N/A 07/29/2022   Procedure: Coronary/Graft Acute MI Revascularization;  Surgeon: Burnard Debby LABOR, MD;  Location: [facility] INVASIVE CV LAB;  Service: Cardiovascular;  Laterality: N/A;   LEFT HEART CATH AND CORONARY ANGIOGRAPHY N/A 07/29/2022   Procedure: LEFT HEART CATH AND CORONARY ANGIOGRAPHY;  Surgeon: Burnard Debby LABOR, MD;  Location: [facility] INVASIVE CV LAB;  Service: Cardiovascular;  Laterality: N/A;   LUMBAR LAMINECTOMY/DECOMPRESSION MICRODISCECTOMY N/A 04/26/2021   Procedure: CENTRAL LAMINECTOMIES LUMBAR TWO-THREE,  LUMBAR THREE-FOUR WITH BILATERAL FORAMINOTOMIES;  Surgeon: Lucilla Lynwood BRAVO, MD;  Location: [facility] OR;  Service: Orthopedics;  Laterality: N/A;   TOTAL KNEE ARTHROPLASTY Left 03/27/2020   Procedure: LEFT TOTAL KNEE ARTHROPLASTY;  Surgeon: Jerri Kay HERO, MD;  Location: [facility] OR;  Service: Orthopedics;  Laterality: Left;   VAGINAL HYSTERECTOMY     partial   VIDEO BRONCHOSCOPY Bilateral 03/19/2015   Procedure: VIDEO BRONCHOSCOPY WITHOUT FLUORO;  Surgeon: Harden Jude GAILS, MD;  Location: [facility] ENDOSCOPY;  Service: Cardiopulmonary;  Laterality: Bilateral;    Current Medications: Current Meds  Medication Sig   acetaminophen  (TYLENOL ) 650 MG CR tablet Take 650 mg by mouth 2 (two) times daily.   amLODipine  (NORVASC ) 5 MG tablet Take 1 tablet (5 mg total) by mouth daily.   amoxicillin  (AMOXIL ) 500 MG tablet take 4 pills one hour prior to dental work   aspirin  EC 81 MG tablet Take 1 tablet (81 mg total) by mouth at bedtime.   atorvastatin  (LIPITOR ) 80 MG tablet Take  1 tablet (80 mg total) by mouth daily.   clopidogrel  (PLAVIX ) 75 MG tablet Take 1 tablet (75 mg total) by mouth daily.   dorzolamide -timolol  (COSOPT ) 22.3-6.8 MG/ML ophthalmic solution Place 1 drop into both eyes 2 (two) times daily.   fluticasone  (FLONASE ) 50 MCG/ACT nasal spray Place 2 sprays into both nostrils daily.   hydrochlorothiazide  (HYDRODIURIL ) 12.5 MG tablet Take 12.5 mg by mouth daily.   LUMIGAN 0.01 % SOLN Place 1 drop into both eyes at bedtime.   metoprolol  tartrate (LOPRESSOR ) 25 MG tablet Take 1 tablet (25 mg total) by mouth 2 (two) times daily.   Multiple Vitamins-Minerals (MULTIVITAMIN WOMEN 50+) TABS Take 1 tablet by mouth daily.   nabumetone  (RELAFEN ) 750 MG tablet Take 750 mg by mouth 2 (two) times daily.   nitroGLYCERIN  (NITROSTAT ) 0.4 MG SL tablet Place 0.4 mg under the tongue every 5 (five) minutes as needed for chest pain.   pantoprazole  (PROTONIX ) 40 MG tablet Take 1 tablet (40 mg total) by mouth daily.   valsartan  (DIOVAN ) 160 MG tablet Take 1 tablet (160 mg total) by mouth at  bedtime.   venlafaxine  XR (EFFEXOR -XR) 150 MG 24 hr capsule Take 1 capsule (150 mg total) by mouth daily with breakfast.     Allergies:   Zestril  [lisinopril ]   Social History   Socioeconomic History   Marital status: Married    Spouse name: Not on file   Number of children: Not on file   Years of education: Not on file   Highest education level: Not on file  Occupational History   Occupation: retired    Comment: Designer, fashion/clothing; dietician   Occupation: retired  Tobacco Use   Smoking status: Never   Smokeless tobacco: Never  Vaping Use   Vaping status: Never Used  Substance and Sexual Activity   Alcohol use: No   Drug use: No   Sexual activity: Not on file  Other Topics Concern   Not on file  Social History Narrative   Not on file   Social Drivers of Health   Financial Resource Strain: Not on file  Food Insecurity: No Food Insecurity (08/04/2023)   Hunger Vital Sign    Worried  About Running Out of Food in the Last Year: Never true    Ran Out of Food in the Last Year: Never true  Transportation Needs: No Transportation Needs (08/04/2023)   PRAPARE - Administrator, Civil Service (Medical): No    Lack of Transportation (Non-Medical): No  Physical Activity: Not on file  Stress: Not on file  Social Connections: Socially Integrated (08/04/2023)   Social Connection and Isolation Panel    Frequency of Communication with Friends and Family: Three times a week    Frequency of Social Gatherings with Friends and Family: Three times a week    Attends Religious Services: 1 to 4 times per year    Active Member of Clubs or Organizations: Yes    Attends Banker Meetings: 1 to 4 times per year    Marital Status: Married     Family History: The patient's family history includes Colon cancer in her brother; Diabetes in her brother, brother, mother, and sister; Glaucoma in her mother; Hypertension in her father; Lung cancer in her brother; Lupus in her child; Stomach cancer in her father.  ROS:   Please see the history of present illness.    All other systems reviewed and are negative.  EKGs/Labs/Other Studies Reviewed:    The following studies were reviewed today: I discussed my findings with the patient at length   Recent Labs: 08/05/2023: ALT 21; BUN 11; Creatinine, Ser 0.94; Hemoglobin 13.1; Magnesium 1.4; Platelets 156; Potassium 3.6; Sodium 144; TSH 1.746  Recent Lipid Panel    Component Value Date/Time   CHOL 97 08/04/2023 0201   CHOL 109 11/23/2022 0854   TRIG 152 (H) 08/04/2023 0201   HDL 31 (L) 08/04/2023 0201   HDL 40 11/23/2022 0854   CHOLHDL 3.1 08/04/2023 0201   VLDL 30 08/04/2023 0201   LDLCALC 36 08/04/2023 0201   LDLCALC 46 11/23/2022 0854    Physical Exam:    VS:  BP 124/70   Pulse 66   Ht 5' 1 (1.549 m)   Wt 163 lb 12.8 oz (74.3 kg)   SpO2 94%   BMI 30.95 kg/m     Wt Readings from Last 3 Encounters:  10/26/23  163 lb 12.8 oz (74.3 kg)  10/23/23 164 lb (74.4 kg)  08/03/23 167 lb (75.8 kg)     GEN: Patient is in no acute distress HEENT: Normal NECK: No JVD;  No carotid bruits LYMPHATICS: No lymphadenopathy CARDIAC: Hear sounds regular, 2/6 systolic murmur at the apex. RESPIRATORY:  Clear to auscultation without rales, wheezing or rhonchi  ABDOMEN: Soft, non-tender, non-distended MUSCULOSKELETAL:  No edema; No deformity  SKIN: Warm and dry NEUROLOGIC:  Alert and oriented x 3 PSYCHIATRIC:  Normal affect   Signed, Jennifer JONELLE Crape, MD  10/26/2023 8:55 AM    [facility]

## 2023-10-30 ENCOUNTER — Telehealth: Payer: Self-pay

## 2023-10-30 NOTE — Telephone Encounter (Signed)
 Tinidazole   2 g p.o. single dose x 1 RG

## 2023-10-30 NOTE — Telephone Encounter (Signed)
 [facility] came back positive for Giardia lamblia. Please advise

## 2023-10-31 MED ORDER — TINIDAZOLE 500 MG PO TABS: 2.0000 g | ORAL_TABLET | Freq: Once | ORAL | 0 refills | Status: AC

## 2023-10-31 NOTE — Telephone Encounter (Signed)
 Patient's husband made aware and said he will pick it up today

## 2023-11-05 ENCOUNTER — Other Ambulatory Visit: Payer: Self-pay | Admitting: Cardiology

## 2023-11-08 ENCOUNTER — Ambulatory Visit (INDEPENDENT_AMBULATORY_CARE_PROVIDER_SITE_OTHER)
Admission: RE | Admit: 2023-11-08 | Discharge: 2023-11-08 | Disposition: A | Discharge status: Home / Self Care | Source: Ambulatory Visit | Attending: Gastroenterology | Admitting: Gastroenterology

## 2023-11-08 DIAGNOSIS — K5900 Constipation, unspecified: Secondary | ICD-10-CM

## 2023-11-08 DIAGNOSIS — I878 Other specified disorders of veins: Secondary | ICD-10-CM | POA: Diagnosis not present

## 2023-11-13 ENCOUNTER — Encounter: Payer: Self-pay | Admitting: Neurology

## 2023-11-13 ENCOUNTER — Ambulatory Visit (INDEPENDENT_AMBULATORY_CARE_PROVIDER_SITE_OTHER): Admitting: Neurology

## 2023-11-13 VITALS — BP 144/76 | HR 62 | Ht 61.0 in | Wt 164.4 lb

## 2023-11-13 DIAGNOSIS — R479 Unspecified speech disturbances: Secondary | ICD-10-CM | POA: Diagnosis not present

## 2023-11-13 DIAGNOSIS — I6381 Other cerebral infarction due to occlusion or stenosis of small artery: Secondary | ICD-10-CM

## 2023-11-13 DIAGNOSIS — G3184 Mild cognitive impairment, so stated: Secondary | ICD-10-CM

## 2023-11-13 NOTE — Patient Instructions (Signed)
 I had a long d/w patient and her husband about her recent lacunar stroke, speech difficulties, mild cognitive impairment and risk of progressing to dementia, risk for recurrent stroke/TIAs, personally independently reviewed imaging studies and stroke evaluation results and answered questions.Continue Plavix  75 mg daily alone now and discontinue aspirin  for secondary stroke prevention and maintain strict control of hypertension with blood pressure goal below 130/90, diabetes with hemoglobin A1c goal below 6.5% and lipids with LDL cholesterol goal below 70 mg/dL. I also advised the patient to eat a healthy diet with plenty of whole grains, cereals, fruits and vegetables, exercise regularly and maintain ideal body weight .I encourage her to increase participation in cognitively challenging activities like solving crossword puzzles, playing bridge and sudoku.  We also discussed memory compensation strategies.  Followup in the future with me in 6 to 8 months or call earlier if necessary.  Stroke Prevention Some medical conditions and behaviors can lead to a higher chance of having a stroke. You can help prevent a stroke by eating healthy, exercising, not smoking, and managing any medical conditions you have. Stroke is a leading cause of functional impairment. Primary prevention is particularly important because a majority of strokes are first-time events. Stroke changes the lives of not only those who experience a stroke but also their family and other caregivers. How can this condition affect me? A stroke is a medical emergency and should be treated right away. A stroke can lead to brain damage and can sometimes be life-threatening. If a person gets medical treatment right away, there is a better chance of surviving and recovering from a stroke. What can increase my risk? The following medical conditions may increase your risk of a stroke: Cardiovascular disease. High blood pressure  (hypertension). Diabetes. High cholesterol. Sickle cell disease. Blood clotting disorders (hypercoagulable state). Obesity. Sleep disorders (obstructive sleep apnea). Other risk factors include: Being older than age 14. Having a history of blood clots, stroke, or mini-stroke (transient ischemic attack, TIA). Genetic factors, such as race, ethnicity, or a family history of stroke. Smoking cigarettes or using other tobacco products. Taking birth control pills, especially if you also use tobacco. Heavy use of alcohol or drugs, especially cocaine and methamphetamine. Physical inactivity. What actions can I take to prevent this? Manage your health conditions High cholesterol levels. Eating a healthy diet is important for preventing high cholesterol. If cholesterol cannot be managed through diet alone, you may need to take medicines. Take any prescribed medicines to control your cholesterol as told by your health care provider. Hypertension. To reduce your risk of stroke, try to keep your blood pressure below 130/80. Eating a healthy diet and exercising regularly are important for controlling blood pressure. If these steps are not enough to manage your blood pressure, you may need to take medicines. Take any prescribed medicines to control hypertension as told by your health care provider. Ask your health care provider if you should monitor your blood pressure at home. Have your blood pressure checked every year, even if your blood pressure is normal. Blood pressure increases with age and some medical conditions. Diabetes. Eating a healthy diet and exercising regularly are important parts of managing your blood sugar (glucose). If your blood sugar cannot be managed through diet and exercise, you may need to take medicines. Take any prescribed medicines to control your diabetes as told by your health care provider. Get evaluated for obstructive sleep apnea. Talk to your health care provider  about getting a sleep evaluation if you snore  a lot or have excessive sleepiness. Make sure that any other medical conditions you have, such as atrial fibrillation or atherosclerosis, are managed. Nutrition Follow instructions from your health care provider about what to eat or drink to help manage your health condition. These instructions may include: Reducing your daily calorie intake. Limiting how much salt (sodium) you use to 1,500 milligrams (mg) each day. Using only healthy fats for cooking, such as olive oil, canola oil, or sunflower oil. Eating healthy foods. You can do this by: Choosing foods that are high in fiber, such as whole grains, and fresh fruits and vegetables. Eating at least 5 servings of fruits and vegetables a day. Try to fill one-half of your plate with fruits and vegetables at each meal. Choosing lean protein foods, such as lean cuts of meat, poultry without skin, fish, tofu, beans, and nuts. Eating low-fat dairy products. Avoiding foods that are high in sodium. This can help lower blood pressure. Avoiding foods that have saturated fat, trans fat, and cholesterol. This can help prevent high cholesterol. Avoiding processed and prepared foods. Counting your daily carbohydrate intake.  Lifestyle If you drink alcohol: Limit how much you have to: 0-1 drink a day for women who are not pregnant. 0-2 drinks a day for men. Know how much alcohol is in your drink. In the [place] one drink equals one 12 oz bottle of beer ( ), one 5 oz glass of wine ( ), or one 1 oz glass of hard liquor (44mL). Do not use any products that contain nicotine or tobacco. These products include cigarettes, chewing tobacco, and vaping devices, such as e-cigarettes. If you need help quitting, ask your health care provider. Avoid secondhand smoke. Do not use drugs. Activity  Try to stay at a healthy weight. Get at least 30 minutes of exercise on most days, such as: Fast  walking. Biking. Swimming. Medicines Take over-the-counter and prescription medicines only as told by your health care provider. Aspirin  or blood thinners (antiplatelets or anticoagulants) may be recommended to reduce your risk of forming blood clots that can lead to stroke. Avoid taking birth control pills. Talk to your health care provider about the risks of taking birth control pills if: You are over 52 years old. You smoke. You get very bad headaches. You have had a blood clot. Where to find more information American Stroke Association: www.strokeassociation.org Get help right away if: You or a loved one has any symptoms of a stroke. BE FAST is an easy way to remember the main warning signs of a stroke: B - Balance. Signs are dizziness, sudden trouble walking, or loss of balance. E - Eyes. Signs are trouble seeing or a sudden change in vision. F - Face. Signs are sudden weakness or numbness of the face, or the face or eyelid drooping on one side. A - Arms. Signs are weakness or numbness in an arm. This happens suddenly and usually on one side of the body. S - Speech. Signs are sudden trouble speaking, slurred speech, or trouble understanding what people say. T - Time. Time to call emergency services. Write down what time symptoms started. You or a loved one has other signs of a stroke, such as: A sudden, severe headache with no known cause. Nausea or vomiting. Seizure. These symptoms may represent a serious problem that is an emergency. Do not wait to see if the symptoms will go away. Get medical help right away. Call your local emergency services (911 in the [place] Do not drive yourself  to the hospital. Summary You can help to prevent a stroke by eating healthy, exercising, not smoking, limiting alcohol intake, and managing any medical conditions you may have. Do not use any products that contain nicotine or tobacco. These include cigarettes, chewing tobacco, and vaping devices,  such as e-cigarettes. If you need help quitting, ask your health care provider. Remember BE FAST for warning signs of a stroke. Get help right away if you or a loved one has any of these signs. This information is not intended to replace advice given to you by your health care provider. Make sure you discuss any questions you have with your health care provider. Document Revised: 02/28/2022 Document Reviewed: 02/28/2022 Elsevier Patient Education  2024 [employer].

## 2023-11-13 NOTE — Progress Notes (Signed)
 [facility] [address] [place]. [place] [address] [phone]       OFFICE FOLLOW-UP NOTE  [name] Date of Birth:  1940/11/08 Medical Record Number:  [id number]   HPI: [name] is a 83 year old pleasant Caucasian lady seen today for initial office follow-up visit following hospital consultation for stroke in April 2025.  She is accompanied by her husband.  History is obtained from them and review of electronic medical records and I personally reviewed pertinent available imaging films in PACS. She has past medical history of diabetes, hypertension, hyperlipidemia, STEMI, GERD, glaucoma.  She presented on/20 08/2023 she woke up the previous day at 3 AM to go to the restroom and had some difficulty with walking.  She went back to sleep and next day she noticed slurred speech and symptoms word finding and speaking difficulties.  Later on that day she developed some right-sided weakness as well.  Family brought him to the emergency room.  CT head showed no acute abnormalities and CT angiogram of the head and neck showed no large vessel stenosis or occlusion but did show focal supraclinoid right ICA stenosis.  MRI scan of the brain showed small left frontal subcortical white matter infarct which was felt to be likely of lacunar etiology.  2D echo showed ejection fraction of 60 to 65% with mild left atrial dilatation.  LDL cholesterol was 46 mg percent.  Hemoglobin A1c was 6.1.  Patient was discharged on aspirin  and Plavix  for 3 months followed by Plavix  alone.  She went to inpatient rehab.  Patient states she has done well.  She is finished home therapies.  Speech is better though occasionally she struggles with some words and has some word finding difficulty.  She feels she reports short-term memory is poor though she has good days and bad days.  She is mostly independent in all activities of daily living.  She is living at home with her husband.  She states her left  knee pain is bothering her and limits her ability to ambulate and walk.  She has started using a cane while walking outdoors.  She does admit mild short-term memory difficulties but these are not disabling.  On MoCA testing today she scored 19/30.  ROS:   14 system review of systems is positive for speech difficulties, word finding difficulties, poor memory, knee pain, walking difficulty all other systems negative  PMH:  Past Medical History:  Diagnosis Date   AAA (abdominal aortic aneurysm) (HCC)    ACS (acute coronary syndrome) (HCC) 08/01/2022   Arthritis    knees (03/21/2017)   Borderline diabetes    Bulging lumbar disc    CAD (coronary artery disease) 08/17/2022   Depression    Diabetes mellitus due to underlying condition with unspecified complications (HCC) 12/19/2019   Disequilibrium    Dizziness    Essential hypertension 12/19/2019   Fall    Fibromyalgia    GERD (gastroesophageal reflux disease)    Glaucoma, both eyes    Hemoptysis 03/18/2015   Hyperlipidemia    don't have it but I take RX (03/21/2017)   Hypertension    Mixed dyslipidemia 12/19/2019   Mixed hyperlipidemia 04/18/2017   Nuclear cataract of both eyes 01/01/2014   Obesity (BMI 30.0-34.9) 09/29/2022   Plantar fasciitis 10/21/2020   Posterior tibial tendon dysfunction (PTTD) of right lower extremity 11/16/2021   Pre-diabetes    pt states she is not diabetic. Last A1C was normal. Pt is on no medications for DM  Preoperative cardiovascular examination 12/19/2019   Primary osteoarthritis of left foot 10/21/2020   Primary osteoarthritis of left knee 07/18/2019   Solitary pulmonary nodule 03/18/2015   03/2015 6mm RLL nodule    Spinal stenosis of lumbar region with neurogenic claudication 04/26/2021   Spinal stenosis, lumbar region with neurogenic claudication 04/26/2021   Central stenosis, L2-3 and L3-4 with moderate foramenal narrowing L4-5   Status post AAA (abdominal aortic aneurysm) repair  12/19/2019   Status post total left knee replacement 03/27/2020   STEMI (ST elevation myocardial infarction) (HCC) 08/01/2022   STEMI involving right coronary artery (HCC) 07/29/2022   Syncope 03/21/2017   Syncope and collapse    TIA (transient ischemic attack) 08/03/2023    Social History:  Social History   Socioeconomic History   Marital status: Married    Spouse name: Not on file   Number of children: Not on file   Years of education: Not on file   Highest education level: Not on file  Occupational History   Occupation: retired    Comment: Designer, fashion/clothing; dietician   Occupation: retired  Tobacco Use   Smoking status: Never   Smokeless tobacco: Never  Vaping Use   Vaping status: Never Used  Substance and Sexual Activity   Alcohol use: No   Drug use: No   Sexual activity: Not on file  Other Topics Concern   Not on file  Social History Narrative   Not on file   Social Drivers of Health   Financial Resource Strain: Not on file  Food Insecurity: No Food Insecurity (08/04/2023)   Hunger Vital Sign    Worried About Running Out of Food in the Last Year: Never true    Ran Out of Food in the Last Year: Never true  Transportation Needs: No Transportation Needs (08/04/2023)   PRAPARE - Administrator, Civil Service (Medical): No    Lack of Transportation (Non-Medical): No  Physical Activity: Not on file  Stress: Not on file  Social Connections: Socially Integrated (08/04/2023)   Social Connection and Isolation Panel    Frequency of Communication with Friends and Family: Three times a week    Frequency of Social Gatherings with Friends and Family: Three times a week    Attends Religious Services: 1 to 4 times per year    Active Member of Clubs or Organizations: Yes    Attends Banker Meetings: 1 to 4 times per year    Marital Status: Married  Catering manager Violence: Not At Risk (08/04/2023)   Humiliation, Afraid, Rape, and Kick questionnaire    Fear  of Current or Ex-Partner: No    Emotionally Abused: No    Physically Abused: No    Sexually Abused: No    Medications:   Current Outpatient Medications on File Prior to Visit  Medication Sig Dispense Refill   acetaminophen  (TYLENOL ) 650 MG CR tablet Take 650 mg by mouth 2 (two) times daily.     amLODipine  (NORVASC ) 5 MG tablet Take 1 tablet (5 mg total) by mouth daily.     aspirin  EC 81 MG tablet Take 1 tablet (81 mg total) by mouth at bedtime.     atorvastatin  (LIPITOR ) 80 MG tablet Take 1 tablet (80 mg total) by mouth daily. 90 tablet 1   clopidogrel  (PLAVIX ) 75 MG tablet Take 1 tablet (75 mg total) by mouth daily. 30 tablet 1   dorzolamide -timolol  (COSOPT ) 22.3-6.8 MG/ML ophthalmic solution Place 1 drop into both eyes 2 (two) times  daily.     fluticasone  (FLONASE ) 50 MCG/ACT nasal spray Place 2 sprays into both nostrils daily.     hydrochlorothiazide  (HYDRODIURIL ) 12.5 MG tablet Take 12.5 mg by mouth daily.     LUMIGAN 0.01 % SOLN Place 1 drop into both eyes at bedtime.     metoprolol  tartrate (LOPRESSOR ) 25 MG tablet TAKE 1 TABLET BY MOUTH TWICE A DAY 180 tablet 3   Multiple Vitamins-Minerals (MULTIVITAMIN WOMEN 50+) TABS Take 1 tablet by mouth daily.     nabumetone  (RELAFEN ) 750 MG tablet Take 750 mg by mouth 2 (two) times daily.  4   nitroGLYCERIN  (NITROSTAT ) 0.4 MG SL tablet Place 0.4 mg under the tongue every 5 (five) minutes as needed for chest pain.     pantoprazole  (PROTONIX ) 40 MG tablet Take 1 tablet (40 mg total) by mouth daily. 30 tablet 0   valsartan  (DIOVAN ) 160 MG tablet Take 1 tablet (160 mg total) by mouth at bedtime.     venlafaxine  XR (EFFEXOR -XR) 150 MG 24 hr capsule Take 1 capsule (150 mg total) by mouth daily with breakfast. 30 capsule 0   amoxicillin  (AMOXIL ) 500 MG tablet take 4 pills one hour prior to dental work (Patient not taking: Reported on 11/13/2023) 4 tablet 2   diclofenac  Sodium (VOLTAREN ) 1 % GEL Apply 2 g topically 4 (four) times daily. (Patient not  taking: Reported on 11/13/2023) 150 g 2   No current facility-administered medications on file prior to visit.    Allergies:   Allergies  Allergen Reactions   Zestril  [Lisinopril ] Cough    Physical Exam General: well developed, well nourished, seated, in no evident distress Head: head normocephalic and atraumatic.  Neck: supple with no carotid or supraclavicular bruits Cardiovascular: regular rate and rhythm, no murmurs Musculoskeletal: no deformity Skin:  no rash/petichiae Vascular:  Normal pulses all extremities Vitals:   11/13/23 0911  BP: (!) 144/76  Pulse: 62  SpO2: 92%   Neurologic Exam Mental Status: Awake and fully alert. Oriented to place and time. Recent and remote memory intact. Attention span, concentration and fund of knowledge appropriate. Mood and affect appropriate.  Mild intermittent word finding difficulties and some expressive language difficulties.  Diminished recall 2/3.  Able to name 11 animals which can walk on forelegs.  Clock drawing 4/4.  On MoCA testing scored 19/30. Cranial Nerves: Fundoscopic exam reveals sharp disc margins. Pupils equal, briskly reactive to light. Extraocular movements full without nystagmus. Visual fields full to confrontation. Hearing intact. Facial sensation intact. Face, tongue, palate moves normally and symmetrically.  Motor: Normal bulk and tone. Normal strength in all tested extremity muscles. Sensory.: intact to touch ,pinprick .position and vibratory sensation.  Coordination: Rapid alternating movements normal in all extremities. Finger-to-nose and heel-to-shin performed accurately bilaterally. Gait and Station: Arises from chair without difficulty. Stance is normal.  Favors left knee while walking with a slow cautious gait.  Not able to heel, toe and tandem walk without difficulty.  Reflexes: 1+ and symmetric. Toes downgoing.   NIHSS  1 Modified Rankin  2    11/13/2023   10:24 AM  Montreal Cognitive Assessment    Visuospatial/ Executive (0/5) 4  Naming (0/3) 2  Attention: Read list of digits (0/2) 2  Attention: Read list of letters (0/1) 0  Attention: Serial 7 subtraction starting at 100 (0/3) 0  Language: Repeat phrase (0/2) 1  Language : Fluency (0/1) 0  Abstraction (0/2) 2  Delayed Recall (0/5) 2  Orientation (0/6) 5  Total 18  Adjusted Score (based on education) 28     ASSESSMENT: 83 year old Caucasian lady with left frontal white matter lacunar infarct in April 2025 due to small vessel disease.  She also has mild age-related cognitive impairment.  Vascular risk factors of diabetes, hypertension, hyperlipidemia, coronary artery disease and age     PLAN:I had a long d/w patient and her husband about her recent lacunar stroke, speech difficulties, mild cognitive impairment and risk of progressing to dementia, risk for recurrent stroke/TIAs, personally independently reviewed imaging studies and stroke evaluation results and answered questions.Continue Plavix  75 mg daily alone now and discontinue aspirin  for secondary stroke prevention and maintain strict control of hypertension with blood pressure goal below 130/90, diabetes with hemoglobin A1c goal below 6.5% and lipids with LDL cholesterol goal below 70 mg/dL. I also advised the patient to eat a healthy diet with plenty of whole grains, cereals, fruits and vegetables, exercise regularly and maintain ideal body weight .I encourage her to increase participation in cognitively challenging activities like solving crossword puzzles, playing bridge and sudoku.  We also discussed memory compensation strategies.  Followup in the future with me in 6 to 8 months or call earlier if necessary.    I personally spent a total of 45 minutes in the care of the patient today including getting/reviewing separately obtained history, performing a medically appropriate exam/evaluation, counseling and educating, placing orders, referring and communicating with other  health care professionals, documenting clinical information in the EHR, independently interpreting results, and coordinating care.        Eather Popp, MD Note: This document was prepared with digital dictation and possible smart phrase technology. Any transcriptional errors that result from this process are unintentional

## 2023-11-15 ENCOUNTER — Ambulatory Visit

## 2023-11-20 ENCOUNTER — Telehealth: Payer: Self-pay | Admitting: Gastroenterology

## 2023-11-20 ENCOUNTER — Other Ambulatory Visit: Payer: Self-pay

## 2023-11-20 DIAGNOSIS — R935 Abnormal findings on diagnostic imaging of other abdominal regions, including retroperitoneum: Secondary | ICD-10-CM

## 2023-11-20 DIAGNOSIS — L290 Pruritus ani: Secondary | ICD-10-CM

## 2023-11-20 DIAGNOSIS — R197 Diarrhea, unspecified: Secondary | ICD-10-CM

## 2023-11-20 DIAGNOSIS — K5900 Constipation, unspecified: Secondary | ICD-10-CM

## 2023-11-20 NOTE — Telephone Encounter (Signed)
 Giardia usually can be self-limiting and normally does not cause rectal itching or discomfort. Other ova and parasites could potentially do this, as well as anal fissures which are small tears in the rectum can cause burning, itching and discomfort especially after bowel movement. Patient can get ova and parasite stool culture and Giardia testing, repeat KUB as is planned.  If any of these are abnormal we will treat appropriately.  Also suggest getting over-the-counter Calmol 4 suppositories which can help with an anal fissure and/or RectiCare to help with the itching and discomfort. Make sure she is on a fiber supplement And can give information about postinfectious IBS as listed below. Please also offer office visit with myself or we could do a rectal exam and look for the fissure or we can start with the stool studies and KUB.   You may have POST INFECTIOUS IBS OR IRRITABLE BOWEL After an infection your intestines can spasm or be a little bit more sensitive. Try these things below:  Can do  low FODMAP- see below Try trial off milk/lactose products.  Add fiber like benefiber or citracel once a day Can do trial of IBGard for AB pain EVERY DAY- Take 1-2 capsules once a day for maintence or twice a day during a flare if any worsening symptoms like blood in stool, weight loss, please call the office or go to the ER.    FODMAP stands for fermentable oligo-, di-, mono-saccharides and polyols (1). These are the scientific terms used to classify groups of carbs that are notorious for triggering digestive symptoms like bloating, gas and stomach pain.

## 2023-11-20 NOTE — Telephone Encounter (Signed)
 Inbound call from patient's husband requesting a call to discuss 7/30 imaging results. Please advise, thank you

## 2023-11-20 NOTE — Telephone Encounter (Signed)
 Pt husband Ozzie made aware of recent results and Alan Coombs PA recommendations. Ozzie stated that the diarrhea is better, but the pt is having itching and burning to her rectum. Pt and Pt husband ozzie stated that they feel that it is coming from the parasite. Pt chart was reviewed and noted the following documentation.   Charlanne Groom, MD to Kathie Lyle BRAVO, CMA  RG   10/30/23 10:06 PM Note Tinidazole   2 g p.o. single dose x 1 RG       10/30/23  2:13 PM Harp, Lyle BRAVO, CMA routed this conversation to Charlanne Groom, MD  Kathie Lyle BRAVO, [place]    10/30/23  2:12 PM Note Diatherix came back positive for Giardia lamblia. Please advise       I discussed OTC treatment for hemorrhoids such as Preparation H cream, using wet wipes,and sitz baths. Husband stated that they could try it but still feels that the itching and burning are from the parasites.   Ozzie was in agreement with repeat KUB. Order placed and Ozzie made aware. Location provided for repeat KUB.  Please review and advise on concern for the itching and burning recent  Giardia lamblia.

## 2023-11-20 NOTE — Telephone Encounter (Signed)
 Dr. Charlanne Pt. ( Dr. [facility] Doctor)  Chart reviewed. Results available for review Please review and advise

## 2023-11-20 NOTE — Addendum Note (Signed)
 Addended by: CRAIG PALMA on: 11/20/2023 03:52 PM   Modules accepted: Orders

## 2023-11-20 NOTE — Telephone Encounter (Signed)
 Pt husband was contacted.  Spoken with pt husband prior. Previously documented and sent to provider for review.  Pt verbalized understanding with all questions answered.

## 2023-11-20 NOTE — Telephone Encounter (Signed)
 Patient stated that he received a call and was was wanting to speak to the nurse. Please advise.

## 2023-11-21 NOTE — Telephone Encounter (Signed)
 Pt husband Cassandra Brooks  made aware of Cassandra Coombs PA recommendations.  Cassandra Brooks stated that they would come tomorrow for the xray and collect the stool kit.  Cassandra Brooks verbalized understanding with all questions answered.

## 2023-11-22 ENCOUNTER — Ambulatory Visit: Payer: Self-pay | Admitting: Gastroenterology

## 2023-11-22 ENCOUNTER — Telehealth: Payer: Self-pay

## 2023-11-22 ENCOUNTER — Other Ambulatory Visit

## 2023-11-22 ENCOUNTER — Ambulatory Visit (INDEPENDENT_AMBULATORY_CARE_PROVIDER_SITE_OTHER)
Admission: RE | Admit: 2023-11-22 | Discharge: 2023-11-22 | Disposition: A | Discharge status: Home / Self Care | Source: Ambulatory Visit | Attending: Physician Assistant | Admitting: Physician Assistant

## 2023-11-22 DIAGNOSIS — K5900 Constipation, unspecified: Secondary | ICD-10-CM | POA: Diagnosis not present

## 2023-11-22 DIAGNOSIS — R935 Abnormal findings on diagnostic imaging of other abdominal regions, including retroperitoneum: Secondary | ICD-10-CM | POA: Diagnosis not present

## 2023-11-22 NOTE — Telephone Encounter (Addendum)
 Received a call from Darice in the [facility] stating that pt labs were dropped but was not in the correct collection containers.  Correct containers were provided for the pt today . Pt husband Ozzie  was contacted and made aware.  Ozzie stated that he would recollect the stool in the containers provided and return them. Ozzie verbalized understanding with all questions answered.

## 2023-11-24 ENCOUNTER — Other Ambulatory Visit

## 2023-11-24 DIAGNOSIS — L290 Pruritus ani: Secondary | ICD-10-CM

## 2023-11-24 DIAGNOSIS — R197 Diarrhea, unspecified: Secondary | ICD-10-CM

## 2023-11-27 LAB — GIARDIA AND CRYPTOSPORIDIUM ANTIGEN PANEL
MICRO NUMBER:: 16837982
RESULT:: NOT DETECTED
SPECIMEN QUALITY:: ADEQUATE
Specimen Quality:: ADEQUATE
micro Number:: 16837981

## 2023-11-28 LAB — STOOL CULTURE: E coli, Shiga toxin Assay: NEGATIVE

## 2023-11-29 LAB — OVA AND PARASITE EXAMINATION
CONCENTRATE RESULT:: NONE SEEN
MICRO NUMBER:: 16837986
SPECIMEN QUALITY:: ADEQUATE
TRICHROME RESULT:: NONE SEEN

## 2023-11-30 ENCOUNTER — Ambulatory Visit: Payer: Self-pay | Admitting: Physician Assistant

## 2023-12-06 ENCOUNTER — Ambulatory Visit

## 2023-12-07 ENCOUNTER — Encounter: Payer: Self-pay | Admitting: Gastroenterology

## 2023-12-07 ENCOUNTER — Ambulatory Visit: Payer: Self-pay | Admitting: Physician Assistant

## 2024-01-01 DIAGNOSIS — H401132 Primary open-angle glaucoma, bilateral, moderate stage: Secondary | ICD-10-CM | POA: Diagnosis not present

## 2024-01-01 DIAGNOSIS — H524 Presbyopia: Secondary | ICD-10-CM | POA: Diagnosis not present

## 2024-01-05 DIAGNOSIS — Z6830 Body mass index (BMI) 30.0-30.9, adult: Secondary | ICD-10-CM | POA: Diagnosis not present

## 2024-01-05 DIAGNOSIS — E538 Deficiency of other specified B group vitamins: Secondary | ICD-10-CM | POA: Diagnosis not present

## 2024-01-05 DIAGNOSIS — Z23 Encounter for immunization: Secondary | ICD-10-CM | POA: Diagnosis not present

## 2024-01-05 DIAGNOSIS — I10 Essential (primary) hypertension: Secondary | ICD-10-CM | POA: Diagnosis not present

## 2024-01-05 DIAGNOSIS — R42 Dizziness and giddiness: Secondary | ICD-10-CM | POA: Diagnosis not present

## 2024-02-01 DIAGNOSIS — H401132 Primary open-angle glaucoma, bilateral, moderate stage: Secondary | ICD-10-CM | POA: Diagnosis not present

## 2024-02-01 DIAGNOSIS — H2513 Age-related nuclear cataract, bilateral: Secondary | ICD-10-CM | POA: Diagnosis not present

## 2024-02-12 ENCOUNTER — Encounter: Payer: Self-pay | Admitting: Radiology

## 2024-02-20 DIAGNOSIS — M1990 Unspecified osteoarthritis, unspecified site: Secondary | ICD-10-CM | POA: Diagnosis not present

## 2024-02-20 DIAGNOSIS — I10 Essential (primary) hypertension: Secondary | ICD-10-CM | POA: Diagnosis not present

## 2024-02-20 DIAGNOSIS — F411 Generalized anxiety disorder: Secondary | ICD-10-CM | POA: Diagnosis not present

## 2024-02-20 DIAGNOSIS — R42 Dizziness and giddiness: Secondary | ICD-10-CM | POA: Diagnosis not present

## 2024-02-20 DIAGNOSIS — K219 Gastro-esophageal reflux disease without esophagitis: Secondary | ICD-10-CM | POA: Diagnosis not present

## 2024-02-20 DIAGNOSIS — R197 Diarrhea, unspecified: Secondary | ICD-10-CM | POA: Diagnosis not present

## 2024-02-20 DIAGNOSIS — Z6831 Body mass index (BMI) 31.0-31.9, adult: Secondary | ICD-10-CM | POA: Diagnosis not present

## 2024-02-20 DIAGNOSIS — R7303 Prediabetes: Secondary | ICD-10-CM | POA: Diagnosis not present

## 2024-02-20 DIAGNOSIS — E785 Hyperlipidemia, unspecified: Secondary | ICD-10-CM | POA: Diagnosis not present

## 2024-02-20 DIAGNOSIS — Z79899 Other long term (current) drug therapy: Secondary | ICD-10-CM | POA: Diagnosis not present

## 2024-02-20 DIAGNOSIS — F325 Major depressive disorder, single episode, in full remission: Secondary | ICD-10-CM | POA: Diagnosis not present

## 2024-03-19 ENCOUNTER — Telehealth: Payer: Self-pay | Admitting: Physical Medicine and Rehabilitation

## 2024-03-19 NOTE — Telephone Encounter (Signed)
Patient would like an appointment with Dr. Newton.  

## 2024-03-21 ENCOUNTER — Inpatient Hospital Stay (HOSPITAL_COMMUNITY)
Admission: EM | Admit: 2024-03-21 | Discharge: 2024-03-26 | DRG: 690 | Disposition: A | Discharge status: Home / Self Care | Attending: Internal Medicine | Admitting: Internal Medicine

## 2024-03-21 ENCOUNTER — Encounter (HOSPITAL_COMMUNITY): Payer: Self-pay

## 2024-03-21 ENCOUNTER — Emergency Department (HOSPITAL_COMMUNITY)

## 2024-03-21 ENCOUNTER — Other Ambulatory Visit: Payer: Self-pay

## 2024-03-21 DIAGNOSIS — Z8249 Family history of ischemic heart disease and other diseases of the circulatory system: Secondary | ICD-10-CM

## 2024-03-21 DIAGNOSIS — N10 Acute tubulo-interstitial nephritis: Principal | ICD-10-CM | POA: Diagnosis present

## 2024-03-21 DIAGNOSIS — K219 Gastro-esophageal reflux disease without esophagitis: Secondary | ICD-10-CM | POA: Diagnosis present

## 2024-03-21 DIAGNOSIS — I2510 Atherosclerotic heart disease of native coronary artery without angina pectoris: Secondary | ICD-10-CM | POA: Diagnosis present

## 2024-03-21 DIAGNOSIS — R55 Syncope and collapse: Secondary | ICD-10-CM

## 2024-03-21 DIAGNOSIS — R911 Solitary pulmonary nodule: Secondary | ICD-10-CM | POA: Diagnosis present

## 2024-03-21 DIAGNOSIS — I10 Essential (primary) hypertension: Secondary | ICD-10-CM | POA: Diagnosis present

## 2024-03-21 DIAGNOSIS — M51369 Other intervertebral disc degeneration, lumbar region without mention of lumbar back pain or lower extremity pain: Secondary | ICD-10-CM | POA: Diagnosis not present

## 2024-03-21 DIAGNOSIS — Z96652 Presence of left artificial knee joint: Secondary | ICD-10-CM | POA: Diagnosis present

## 2024-03-21 DIAGNOSIS — E876 Hypokalemia: Secondary | ICD-10-CM | POA: Diagnosis present

## 2024-03-21 DIAGNOSIS — Z8673 Personal history of transient ischemic attack (TIA), and cerebral infarction without residual deficits: Secondary | ICD-10-CM

## 2024-03-21 DIAGNOSIS — Z833 Family history of diabetes mellitus: Secondary | ICD-10-CM

## 2024-03-21 DIAGNOSIS — M797 Fibromyalgia: Secondary | ICD-10-CM | POA: Diagnosis present

## 2024-03-21 DIAGNOSIS — E66811 Obesity, class 1: Secondary | ICD-10-CM | POA: Diagnosis not present

## 2024-03-21 DIAGNOSIS — Z7902 Long term (current) use of antithrombotics/antiplatelets: Secondary | ICD-10-CM

## 2024-03-21 DIAGNOSIS — N12 Tubulo-interstitial nephritis, not specified as acute or chronic: Principal | ICD-10-CM

## 2024-03-21 DIAGNOSIS — E119 Type 2 diabetes mellitus without complications: Secondary | ICD-10-CM | POA: Diagnosis present

## 2024-03-21 DIAGNOSIS — M5136 Other intervertebral disc degeneration, lumbar region with discogenic back pain only: Secondary | ICD-10-CM | POA: Diagnosis present

## 2024-03-21 DIAGNOSIS — R112 Nausea with vomiting, unspecified: Secondary | ICD-10-CM | POA: Diagnosis present

## 2024-03-21 DIAGNOSIS — Z90711 Acquired absence of uterus with remaining cervical stump: Secondary | ICD-10-CM

## 2024-03-21 DIAGNOSIS — E669 Obesity, unspecified: Secondary | ICD-10-CM | POA: Diagnosis present

## 2024-03-21 DIAGNOSIS — Z79899 Other long term (current) drug therapy: Secondary | ICD-10-CM | POA: Diagnosis not present

## 2024-03-21 DIAGNOSIS — E088 Diabetes mellitus due to underlying condition with unspecified complications: Secondary | ICD-10-CM | POA: Diagnosis present

## 2024-03-21 DIAGNOSIS — G8929 Other chronic pain: Secondary | ICD-10-CM | POA: Diagnosis present

## 2024-03-21 DIAGNOSIS — E782 Mixed hyperlipidemia: Secondary | ICD-10-CM | POA: Diagnosis present

## 2024-03-21 DIAGNOSIS — F3342 Major depressive disorder, recurrent, in full remission: Secondary | ICD-10-CM | POA: Diagnosis present

## 2024-03-21 DIAGNOSIS — Z6831 Body mass index (BMI) 31.0-31.9, adult: Secondary | ICD-10-CM

## 2024-03-21 DIAGNOSIS — H409 Unspecified glaucoma: Secondary | ICD-10-CM | POA: Diagnosis present

## 2024-03-21 DIAGNOSIS — R197 Diarrhea, unspecified: Secondary | ICD-10-CM | POA: Diagnosis present

## 2024-03-21 DIAGNOSIS — I252 Old myocardial infarction: Secondary | ICD-10-CM

## 2024-03-21 DIAGNOSIS — Z7982 Long term (current) use of aspirin: Secondary | ICD-10-CM

## 2024-03-21 DIAGNOSIS — M545 Low back pain, unspecified: Secondary | ICD-10-CM | POA: Diagnosis not present

## 2024-03-21 DIAGNOSIS — Z888 Allergy status to other drugs, medicaments and biological substances status: Secondary | ICD-10-CM

## 2024-03-21 LAB — COMPREHENSIVE METABOLIC PANEL WITH GFR
ALT: 19 U/L (ref 0–44)
AST: 24 U/L (ref 15–41)
Albumin: 3.3 g/dL — ABNORMAL LOW (ref 3.5–5.0)
Alkaline Phosphatase: 78 U/L (ref 38–126)
Anion gap: 10 (ref 5–15)
BUN: 23 mg/dL (ref 8–23)
CO2: 27 mmol/L (ref 22–32)
Calcium: 9.1 mg/dL (ref 8.9–10.3)
Chloride: 101 mmol/L (ref 98–111)
Creatinine, Ser: 0.95 mg/dL (ref 0.44–1.00)
GFR, Estimated: 59 mL/min — ABNORMAL LOW (ref >60)
Glucose, Bld: 161 mg/dL — ABNORMAL HIGH (ref 70–99)
Potassium: 3.7 mmol/L (ref 3.5–5.1)
Sodium: 138 mmol/L (ref 135–145)
Total Bilirubin: 1.1 mg/dL (ref 0.0–1.2)
Total Protein: 7.9 g/dL (ref 6.5–8.1)

## 2024-03-21 LAB — URINALYSIS, ROUTINE W REFLEX MICROSCOPIC
Bilirubin Urine: NEGATIVE
Glucose, UA: NEGATIVE mg/dL
Hgb urine dipstick: NEGATIVE
Ketones, ur: NEGATIVE mg/dL
Nitrite: NEGATIVE
Protein, ur: NEGATIVE mg/dL
Specific Gravity, Urine: 1.016 (ref 1.005–1.030)
WBC, UA: >50 WBC/hpf (ref 0–5)
pH: 5 (ref 5.0–8.0)

## 2024-03-21 LAB — CBC WITH DIFFERENTIAL/PLATELET
Abs Immature Granulocytes: 0.01 K/uL (ref 0.00–0.07)
Basophils Absolute: 0.1 K/uL (ref 0.0–0.1)
Basophils Relative: 1 %
Eosinophils Absolute: 0.3 K/uL (ref 0.0–0.5)
Eosinophils Relative: 4 %
HCT: 43.9 % (ref 36.0–46.0)
Hemoglobin: 14.4 g/dL (ref 12.0–15.0)
Immature Granulocytes: 0 %
Lymphocytes Relative: 27 %
Lymphs Abs: 1.9 K/uL (ref 0.7–4.0)
MCH: 35 pg — ABNORMAL HIGH (ref 26.0–34.0)
MCHC: 32.8 g/dL (ref 30.0–36.0)
MCV: 106.6 fL — ABNORMAL HIGH (ref 80.0–100.0)
Monocytes Absolute: 0.7 K/uL (ref 0.1–1.0)
Monocytes Relative: 10 %
Neutro Abs: 4.2 K/uL (ref 1.7–7.7)
Neutrophils Relative %: 58 %
Platelets: 217 K/uL (ref 150–400)
RBC: 4.12 MIL/uL (ref 3.87–5.11)
RDW: 12.4 % (ref 11.5–15.5)
WBC: 7 K/uL (ref 4.0–10.5)
nRBC: 0 % (ref 0.0–0.2)

## 2024-03-21 LAB — LIPASE, BLOOD: Lipase: 27 U/L (ref 11–51)

## 2024-03-21 MED ORDER — VENLAFAXINE HCL ER 150 MG PO CP24
150.0000 mg | ORAL_CAPSULE | Freq: Every day | ORAL | Status: DC
  Administered 2024-03-22 – 2024-03-26 (×5): 150 mg via ORAL
  Filled 2024-03-21: qty 2
  Filled 2024-03-21 (×3): qty 1
  Filled 2024-03-21 (×3): qty 2

## 2024-03-21 MED ORDER — ONDANSETRON HCL 4 MG/2ML IJ SOLN
4.0000 mg | Freq: Once | INTRAMUSCULAR | Status: AC
  Administered 2024-03-21: 4 mg via INTRAVENOUS
  Filled 2024-03-21: qty 2

## 2024-03-21 MED ORDER — SODIUM CHLORIDE 0.9 % IV SOLN
2.0000 g | INTRAVENOUS | Status: DC
  Administered 2024-03-22 – 2024-03-26 (×5): 2 g via INTRAVENOUS
  Filled 2024-03-21 (×6): qty 20

## 2024-03-21 MED ORDER — CLOPIDOGREL BISULFATE 75 MG PO TABS
75.0000 mg | ORAL_TABLET | Freq: Every day | ORAL | Status: DC
  Administered 2024-03-22 – 2024-03-26 (×5): 75 mg via ORAL
  Filled 2024-03-21 (×6): qty 1

## 2024-03-21 MED ORDER — SODIUM CHLORIDE 0.9 % IV SOLN
2.0000 g | Freq: Once | INTRAVENOUS | Status: AC
  Administered 2024-03-21: 2 g via INTRAVENOUS
  Filled 2024-03-21: qty 20

## 2024-03-21 MED ORDER — ATORVASTATIN CALCIUM 80 MG PO TABS
80.0000 mg | ORAL_TABLET | Freq: Every day | ORAL | Status: DC
  Administered 2024-03-22 – 2024-03-26 (×5): 80 mg via ORAL
  Filled 2024-03-21 (×6): qty 1

## 2024-03-21 MED ORDER — METOPROLOL TARTRATE 25 MG PO TABS
25.0000 mg | ORAL_TABLET | Freq: Two times a day (BID) | ORAL | Status: DC
  Administered 2024-03-21 – 2024-03-26 (×10): 25 mg via ORAL
  Filled 2024-03-21 (×12): qty 1

## 2024-03-21 MED ORDER — ONDANSETRON HCL 4 MG PO TABS
4.0000 mg | ORAL_TABLET | Freq: Four times a day (QID) | ORAL | Status: DC | PRN
  Filled 2024-03-21 (×5): qty 1

## 2024-03-21 MED ORDER — ENOXAPARIN SODIUM 40 MG/0.4ML IJ SOSY
40.0000 mg | PREFILLED_SYRINGE | INTRAMUSCULAR | Status: DC
  Administered 2024-03-22 – 2024-03-24 (×3): 40 mg via SUBCUTANEOUS
  Filled 2024-03-21 (×3): qty 0.4

## 2024-03-21 MED ORDER — PANTOPRAZOLE SODIUM 40 MG PO TBEC
40.0000 mg | DELAYED_RELEASE_TABLET | Freq: Every day | ORAL | Status: DC
  Administered 2024-03-22 – 2024-03-26 (×5): 40 mg via ORAL
  Filled 2024-03-21 (×6): qty 1

## 2024-03-21 MED ORDER — ACETAMINOPHEN 325 MG PO TABS
650.0000 mg | ORAL_TABLET | Freq: Four times a day (QID) | ORAL | Status: DC | PRN
  Administered 2024-03-22 – 2024-03-26 (×9): 650 mg via ORAL
  Filled 2024-03-21 (×13): qty 2

## 2024-03-21 MED ORDER — ACETAMINOPHEN 650 MG RE SUPP: 650.0000 mg | Freq: Four times a day (QID) | RECTAL | Status: DC | PRN

## 2024-03-21 MED ORDER — AMLODIPINE BESYLATE 5 MG PO TABS: 10.0000 mg | ORAL_TABLET | Freq: Every day | ORAL | Status: DC

## 2024-03-21 MED ORDER — IRBESARTAN 150 MG PO TABS
150.0000 mg | ORAL_TABLET | Freq: Every day | ORAL | Status: DC
  Administered 2024-03-22 – 2024-03-26 (×5): 150 mg via ORAL
  Filled 2024-03-21: qty 1
  Filled 2024-03-21: qty 0.5
  Filled 2024-03-21 (×4): qty 1

## 2024-03-21 MED ORDER — ACETAMINOPHEN 325 MG PO TABS
650.0000 mg | ORAL_TABLET | Freq: Once | ORAL | Status: AC
  Administered 2024-03-21: 650 mg via ORAL
  Filled 2024-03-21: qty 2

## 2024-03-21 MED ORDER — ASPIRIN 81 MG PO TBEC
81.0000 mg | DELAYED_RELEASE_TABLET | Freq: Every day | ORAL | Status: DC
  Administered 2024-03-21 – 2024-03-25 (×5): 81 mg via ORAL
  Filled 2024-03-21 (×6): qty 1

## 2024-03-21 MED ORDER — IOHEXOL 350 MG/ML SOLN
75.0000 mL | Freq: Once | INTRAVENOUS | Status: AC | PRN
  Administered 2024-03-21: 75 mL via INTRAVENOUS

## 2024-03-21 MED ORDER — ONDANSETRON HCL 4 MG/2ML IJ SOLN
4.0000 mg | Freq: Four times a day (QID) | INTRAMUSCULAR | Status: DC | PRN
  Administered 2024-03-21 – 2024-03-22 (×2): 4 mg via INTRAVENOUS
  Filled 2024-03-21 (×2): qty 2

## 2024-03-21 NOTE — ED Provider Notes (Signed)
 I received the patient in signout.  The patient is presenting today with multiple near syncopal episodes at home that the family thinks is related to pain for low back pain.  On reassessment of the patient's labs does appear that she does have significant pyuria.  The patient is given Rocephin for this.  She does have pain in the midline as well as over the flanks bilaterally.  CT scan is ordered and does show pyelonephritis.  Was informed by nursing staff that when the patient tried to go to the bathroom on her own she had a near syncopal episode on the toilet here.  On reassessment she is back to her baseline.  Given these findings and recurrent near syncopal episodes in relation to pyelonephritis she will be admitted for IV antibiotics.  Physical Exam  BP (!) 182/77   Pulse 83   Temp 97.8 F (36.6 C) (Oral)   Resp 16   Ht 5' 1 (1.549 m)   Wt 74.6 kg   SpO2 100%   BMI 31.08 kg/m   Physical Exam General: No acute distress Neuro: No focal deficits  Procedures  Procedures  ED Course / MDM    Medical Decision Making Amount and/or Complexity of Data Reviewed Labs: ordered. Radiology: ordered.  Risk OTC drugs. Prescription drug management. Decision regarding hospitalization.          Ula Prentice SAUNDERS, MD 03/21/24 [phone]

## 2024-03-21 NOTE — H&P (Signed)
 History and Physical    Cassandra Brooks FMW:[id number] DOB: [birth year] DOA: 03/21/2024  PCP: Erick Greig LABOR, NP   Chief Complaint: syncope  HPI: Cassandra Brooks is a 83 y.o. female with medical history significant of CAD, hypertension, hyperlipidemia who presented to the emergency department due to back pain.  Patient's family reported that she has been having progressively worsening pain over the last 2 weeks.  She also has had intermittent episodes of vomiting.  She presents emergency department where she was found to be afebrile and hemodynamically stable.  Labs were obtained on presentation which showed CMP unrevealing, WBC 7.0, hemoglobin 14.4, lipase 27, urinalysis concerning for infection.  Patient underwent CT abdomen which showed right sided pyelonephritis.  Patient was sent on ceftriaxone and admitted further workup.   Review of Systems: Review of Systems  Constitutional: Negative.   HENT: Negative.    Eyes: Negative.   Respiratory: Negative.    Cardiovascular: Negative.   Gastrointestinal: Negative.   Genitourinary:  Positive for dysuria and frequency.  Musculoskeletal: Negative.   Skin: Negative.   Neurological: Negative.   Endo/Heme/Allergies: Negative.   Psychiatric/Behavioral: Negative.       As per HPI otherwise 10 point review of systems negative.   Allergies[1]  Past Medical History:  Diagnosis Date   AAA (abdominal aortic aneurysm)    ACS (acute coronary syndrome) (HCC) 08/01/2022   Arthritis    knees (03/21/2017)   Borderline diabetes    Bulging lumbar disc    CAD (coronary artery disease) 08/17/2022   Depression    Diabetes mellitus due to underlying condition with unspecified complications (HCC) 12/19/2019   Disequilibrium    Dizziness    Essential hypertension 12/19/2019   Fall    Fibromyalgia    GERD (gastroesophageal reflux disease)    Glaucoma, both eyes    Hemoptysis 03/18/2015   Hyperlipidemia    don't have it but I take RX  (03/21/2017)   Hypertension    Mixed dyslipidemia 12/19/2019   Mixed hyperlipidemia 04/18/2017   Nuclear cataract of both eyes 01/01/2014   Obesity (BMI 30.0-34.9) 09/29/2022   Plantar fasciitis 10/21/2020   Posterior tibial tendon dysfunction (PTTD) of right lower extremity 11/16/2021   Pre-diabetes    pt states she is not diabetic. Last A1C was normal. Pt is on no medications for DM   Preoperative cardiovascular examination 12/19/2019   Primary osteoarthritis of left foot 10/21/2020   Primary osteoarthritis of left knee 07/18/2019   Solitary pulmonary nodule 03/18/2015   03/2015 6mm RLL nodule    Spinal stenosis of lumbar region with neurogenic claudication 04/26/2021   Spinal stenosis, lumbar region with neurogenic claudication 04/26/2021   Central stenosis, L2-3 and L3-4 with moderate foramenal narrowing L4-5   Status post AAA (abdominal aortic aneurysm) repair 12/19/2019   Status post total left knee replacement 03/27/2020   STEMI (ST elevation myocardial infarction) (HCC) 08/01/2022   STEMI involving right coronary artery (HCC) 07/29/2022   Syncope 03/21/2017   Syncope and collapse    TIA (transient ischemic attack) 08/03/2023    Past Surgical History:  Procedure Laterality Date   ABDOMINAL AORTIC ANEURYSM REPAIR  1966   APPENDECTOMY  1966   CARDIAC CATHETERIZATION     20+ years ago, no abnormalities found   COLONOSCOPY  11/02/2016   Dr Towana. A few diverticuli were seen in the sigmoid colon. Otherwise normal colonoscopy to cecum   CORONARY/GRAFT ACUTE MI REVASCULARIZATION N/A 07/29/2022   Procedure: Coronary/Graft Acute MI Revascularization;  Surgeon: Burnard Debby LABOR,  MD;  Location: [facility];  Service: Cardiovascular;  Laterality: N/A;   LEFT HEART CATH AND CORONARY ANGIOGRAPHY N/A 07/29/2022   Procedure: LEFT HEART CATH AND CORONARY ANGIOGRAPHY;  Surgeon: Burnard Debby LABOR, MD;  Location: [facility];  Service: Cardiovascular;  Laterality: N/A;   LUMBAR  LAMINECTOMY/DECOMPRESSION MICRODISCECTOMY N/A 04/26/2021   Procedure: CENTRAL LAMINECTOMIES LUMBAR TWO-THREE,  LUMBAR THREE-FOUR WITH BILATERAL FORAMINOTOMIES;  Surgeon: Lucilla Lynwood BRAVO, MD;  Location: [facility] OR;  Service: Orthopedics;  Laterality: N/A;   TOTAL KNEE ARTHROPLASTY Left 03/27/2020   Procedure: LEFT TOTAL KNEE ARTHROPLASTY;  Surgeon: Jerri Kay HERO, MD;  Location: [facility] OR;  Service: Orthopedics;  Laterality: Left;   VAGINAL HYSTERECTOMY     partial   VIDEO BRONCHOSCOPY Bilateral 03/19/2015   Procedure: VIDEO BRONCHOSCOPY WITHOUT FLUORO;  Surgeon: Harden Jude GAILS, MD;  Location: [facility];  Service: Cardiopulmonary;  Laterality: Bilateral;     reports that she has never smoked. She has never used smokeless tobacco. She reports that she does not drink alcohol and does not use drugs.  Family History  Problem Relation Age of Onset   Hypertension Father    Stomach cancer Father    Glaucoma Mother    Diabetes Mother    Diabetes Sister    Diabetes Brother    Lupus Child    Diabetes Brother    Lung cancer Brother    Colon cancer Brother     Prior to Admission medications  Medication Sig Start Date End Date Taking? Authorizing Provider  acetaminophen  (TYLENOL ) 650 MG CR tablet Take 650 mg by mouth 2 (two) times daily.    [provider]  amLODipine  (NORVASC ) 5 MG tablet Take 1 tablet (5 mg total) by mouth daily. 08/07/23   Dennise Lavada POUR, MD  amoxicillin  (AMOXIL ) 500 MG tablet take 4 pills one hour prior to dental work Patient not taking: Reported on 11/13/2023 05/17/21   Jule Ronal CROME, PA-C  aspirin  EC 81 MG tablet Take 1 tablet (81 mg total) by mouth at bedtime. 08/05/23   Dennise Lavada POUR, MD  atorvastatin  (LIPITOR ) 80 MG tablet Take 1 tablet (80 mg total) by mouth daily. 08/02/22   Henry Manuelita NOVAK, NP  clopidogrel  (PLAVIX ) 75 MG tablet Take 1 tablet (75 mg total) by mouth daily. 08/05/23   Singh, Prashant K, MD  diclofenac  Sodium (VOLTAREN ) 1 % GEL Apply 2 g topically  4 (four) times daily. Patient not taking: Reported on 11/13/2023 10/19/21   Jule Ronal CROME, PA-C  dorzolamide -timolol  (COSOPT ) 22.3-6.8 MG/ML ophthalmic solution Place 1 drop into both eyes 2 (two) times daily.    [provider]  fluticasone  (FLONASE ) 50 MCG/ACT nasal spray Place 2 sprays into both nostrils daily. 11/08/19   [provider]  hydrochlorothiazide  (HYDRODIURIL ) 12.5 MG tablet Take 12.5 mg by mouth daily. 09/15/22   [provider]  LUMIGAN 0.01 % SOLN Place 1 drop into both eyes at bedtime. 12/12/19   [provider]  metoprolol  tartrate (LOPRESSOR ) 25 MG tablet TAKE 1 TABLET BY MOUTH TWICE A DAY 11/07/23   Revankar, Rajan R, MD  Multiple Vitamins-Minerals (MULTIVITAMIN WOMEN 50+) TABS Take 1 tablet by mouth daily.    [provider]  nabumetone  (RELAFEN ) 750 MG tablet Take 750 mg by mouth 2 (two) times daily. 02/21/17   [provider]  nitroGLYCERIN  (NITROSTAT ) 0.4 MG SL tablet Place 0.4 mg under the tongue every 5 (five) minutes as needed for chest pain.    [provider]  pantoprazole  (PROTONIX ) 40 MG tablet Take 1 tablet (40 mg total) by mouth daily. 08/06/23   Singh, Prashant K, MD  valsartan  (DIOVAN ) 160 MG tablet Take 1 tablet (160 mg total) by mouth at bedtime. 08/07/23   Dennise Lavada POUR, MD  venlafaxine  XR (EFFEXOR -XR) 150 MG 24 hr capsule Take 1 capsule (150 mg total) by mouth daily with breakfast. 03/28/17   Erle Hails, DO    Physical Exam: Vitals:   03/21/24 1939 03/21/24 1940 03/21/24 2247 03/21/24 2314  BP: (!) 182/77   (!) 157/66  Pulse: 86 83  100  Resp:  16  18  Temp:   98 F (36.7 C) 97.9 F (36.6 C)  TempSrc:   Oral Oral  SpO2: 100% 100%  97%  Weight:      Height:       Physical Exam Constitutional:      Appearance: She is normal weight.  HENT:     Head: Normocephalic.     Nose: Nose normal.     Mouth/Throat:     Mouth: Mucous membranes are moist.     Pharynx: Oropharynx is clear.   Eyes:     Conjunctiva/sclera: Conjunctivae normal.     Pupils: Pupils are equal, round, and reactive to light.  Cardiovascular:     Rate and Rhythm: Normal rate.     Pulses: Normal pulses.     Heart sounds: Normal heart sounds.  Pulmonary:     Effort: Pulmonary effort is normal.     Breath sounds: Normal breath sounds.  Abdominal:     General: Abdomen is flat. Bowel sounds are normal.  Musculoskeletal:        General: Normal range of motion.     Cervical back: Normal range of motion.  Skin:    General: Skin is warm.     Capillary Refill: Capillary refill takes less than 2 seconds.  Neurological:     General: No focal deficit present.     Mental Status: She is alert.  Psychiatric:        Mood and Affect: Mood normal.       Labs on Admission: I have personally reviewed the patients's labs and imaging studies.  Assessment/Plan Principal Problem:   Pyelonephritis   # Acute pyelonephritis - Patient presented with fever and back pain - Imaging findings of Tau nephritis - Urinalysis with concern for infection  Plan: Continue ceftriaxone Start IVF   # Hypertension-continue amlodipine , irbesartan  # Depression-continue venlafaxine   # GERD-continue Protonix   # CAD-continue Plavix    Admission status: Inpatient Med-Surg  Certification: The appropriate patient status for this patient is INPATIENT. Inpatient status is judged to be reasonable and necessary in order to provide the required intensity of service to ensure the patient's safety. The patient's presenting symptoms, physical exam findings, and initial radiographic and laboratory data in the context of their chronic comorbidities is felt to place them at high risk for further clinical deterioration. Furthermore, it is not anticipated that the patient will be medically stable for discharge from the hospital within 2 midnights of admission.   * I certify that at the point of admission it is my clinical judgment that  the patient will require inpatient hospital care spanning beyond 2 midnights from the point of admission due to high intensity of service, high risk for further deterioration and high frequency of surveillance required.DEWAINE Lamar Dess MD [facility] If 7PM-7AM, please contact night-coverage [email or web address]  03/21/2024, 11:37 PM        [  1]  Allergies Allergen Reactions   Zestril  [Lisinopril ] Cough

## 2024-03-21 NOTE — ED Notes (Signed)
 Patient transported to CT

## 2024-03-21 NOTE — ED Triage Notes (Signed)
 Came POV, stating back pain, lower back and comes around the front.  Vomits form the pain Chronic issue but has gotten really bad over last 2 wks

## 2024-03-21 NOTE — ED Notes (Signed)
 Patient assisted onto the bedpan by this RN

## 2024-03-21 NOTE — ED Provider Notes (Signed)
 [place] EMERGENCY DEPARTMENT AT [facility] Provider Note   CSN: [id number] Arrival date & time: 03/21/24  9095     Patient presents with: Back Pain   Cassandra Brooks is a 83 y.o. female with a history of lumbar surgery presenting to ED with combination of back pain, nausea, near syncope.  Patient reports she has had severe pain with standing in her lower back for some time.  However her daughter reports this pain appears of gotten worse in the past 2 weeks.  Specifically when the patient tries to stand up she has severe pain and often becomes very nauseated and then vomits.  Her daughter reports she has been vomiting frequently for now for 2 weeks.  The patient says she is afraid to eat.  She takes Tylenol  regularly at home for pain, avoids NSAIDs due to kidney disease, and does not take narcotic medicines.  She says her back hurts all the time but it is worse with standing.  History of appendectomy   HPI     Prior to Admission medications  Medication Sig Start Date End Date Taking? Authorizing Provider  acetaminophen  (TYLENOL ) 650 MG CR tablet Take 650 mg by mouth 2 (two) times daily.    [provider]  amLODipine  (NORVASC ) 5 MG tablet Take 1 tablet (5 mg total) by mouth daily. 08/07/23   Dennise Lavada POUR, MD  amoxicillin  (AMOXIL ) 500 MG tablet take 4 pills one hour prior to dental work Patient not taking: Reported on 11/13/2023 05/17/21   Jule Ronal CROME, PA-C  aspirin  EC 81 MG tablet Take 1 tablet (81 mg total) by mouth at bedtime. 08/05/23   Dennise Lavada POUR, MD  atorvastatin  (LIPITOR ) 80 MG tablet Take 1 tablet (80 mg total) by mouth daily. 08/02/22   Henry Manuelita NOVAK, NP  clopidogrel  (PLAVIX ) 75 MG tablet Take 1 tablet (75 mg total) by mouth daily. 08/05/23   Singh, Prashant K, MD  diclofenac  Sodium (VOLTAREN ) 1 % GEL Apply 2 g topically 4 (four) times daily. Patient not taking: Reported on 11/13/2023 10/19/21   Jule Ronal CROME, PA-C  dorzolamide -timolol   (COSOPT ) 22.3-6.8 MG/ML ophthalmic solution Place 1 drop into both eyes 2 (two) times daily.    [provider]  fluticasone  (FLONASE ) 50 MCG/ACT nasal spray Place 2 sprays into both nostrils daily. 11/08/19   [provider]  hydrochlorothiazide  (HYDRODIURIL ) 12.5 MG tablet Take 12.5 mg by mouth daily. 09/15/22   [provider]  LUMIGAN 0.01 % SOLN Place 1 drop into both eyes at bedtime. 12/12/19   [provider]  metoprolol  tartrate (LOPRESSOR ) 25 MG tablet TAKE 1 TABLET BY MOUTH TWICE A DAY 11/07/23   Revankar, Rajan R, MD  Multiple Vitamins-Minerals (MULTIVITAMIN WOMEN 50+) TABS Take 1 tablet by mouth daily.    [provider]  nabumetone  (RELAFEN ) 750 MG tablet Take 750 mg by mouth 2 (two) times daily. 02/21/17   [provider]  nitroGLYCERIN  (NITROSTAT ) 0.4 MG SL tablet Place 0.4 mg under the tongue every 5 (five) minutes as needed for chest pain.    [provider]  pantoprazole  (PROTONIX ) 40 MG tablet Take 1 tablet (40 mg total) by mouth daily. 08/06/23   Dennise Lavada POUR, MD  valsartan  (DIOVAN ) 160 MG tablet Take 1 tablet (160 mg total) by mouth at bedtime. 08/07/23   Dennise Lavada POUR, MD  venlafaxine  XR (EFFEXOR -XR) 150 MG 24 hr capsule Take 1 capsule (150 mg total) by mouth daily with breakfast. 03/28/17  Erle Hails, DO    Allergies: Zestril  [lisinopril ]    Review of Systems  Updated Vital Signs BP (!) 178/84   Pulse 70   Temp 97.7 F (36.5 C) (Oral)   Resp 17   SpO2 98%   Physical Exam Constitutional:      General: She is not in acute distress. HENT:     Head: Normocephalic and atraumatic.  Eyes:     Conjunctiva/sclera: Conjunctivae normal.     Pupils: Pupils are equal, round, and reactive to light.  Cardiovascular:     Rate and Rhythm: Normal rate and regular rhythm.  Pulmonary:     Effort: Pulmonary effort is normal. No respiratory distress.  Abdominal:     General: There is no distension.      Tenderness: There is abdominal tenderness.  Skin:    General: Skin is warm and dry.  Neurological:     General: No focal deficit present.     Mental Status: She is alert. Mental status is at baseline.  Psychiatric:        Mood and Affect: Mood normal.        Behavior: Behavior normal.     (all labs ordered are listed, but only abnormal results are displayed) Labs Reviewed  CBC WITH DIFFERENTIAL/PLATELET - Abnormal; Notable for the following components:      Result Value   MCV 106.6 (*)    MCH 35.0 (*)    All other components within normal limits  COMPREHENSIVE METABOLIC PANEL WITH GFR  LIPASE, BLOOD  URINALYSIS, ROUTINE W REFLEX MICROSCOPIC    EKG: None  Radiology: No results found.   Procedures   Medications Ordered in the ED  acetaminophen  (TYLENOL ) tablet 650 mg (650 mg Oral Given 03/21/24 1534)  ondansetron  (ZOFRAN ) injection 4 mg (4 mg Intravenous Given 03/21/24 1534)                                    Medical Decision Making Amount and/or Complexity of Data Reviewed Labs: ordered.  Risk OTC drugs. Prescription drug management.   This patient presents to the ED with concern for low back pain, nausea. This involves an extensive number of treatment options, and is a complaint that carries with it a high risk of complications and morbidity.    Patient signed out to Dr Prentice Medicus EDP at 4 pm pending labs, imaging likely reassessment  Tylenol  and zofran  ordered for pain and nausea      Final diagnoses:  Nausea  Back pain, unspecified back location, unspecified back pain laterality, unspecified chronicity    ED Discharge Orders     None          Cottie Donnice PARAS, MD 03/21/24 [phone]

## 2024-03-22 ENCOUNTER — Ambulatory Visit: Admitting: Gastroenterology

## 2024-03-22 DIAGNOSIS — R197 Diarrhea, unspecified: Secondary | ICD-10-CM | POA: Diagnosis present

## 2024-03-22 LAB — CBC
HCT: 40 % (ref 36.0–46.0)
Hemoglobin: 13.6 g/dL (ref 12.0–15.0)
MCH: 35.3 pg — ABNORMAL HIGH (ref 26.0–34.0)
MCHC: 34 g/dL (ref 30.0–36.0)
MCV: 103.9 fL — ABNORMAL HIGH (ref 80.0–100.0)
Platelets: 192 K/uL (ref 150–400)
RBC: 3.85 MIL/uL — ABNORMAL LOW (ref 3.87–5.11)
RDW: 12.6 % (ref 11.5–15.5)
WBC: 10.8 K/uL — ABNORMAL HIGH (ref 4.0–10.5)
nRBC: 0 % (ref 0.0–0.2)

## 2024-03-22 LAB — BASIC METABOLIC PANEL WITH GFR
Anion gap: 11 (ref 5–15)
BUN: 23 mg/dL (ref 8–23)
CO2: 25 mmol/L (ref 22–32)
Calcium: 8.5 mg/dL — ABNORMAL LOW (ref 8.9–10.3)
Chloride: 101 mmol/L (ref 98–111)
Creatinine, Ser: 1.12 mg/dL — ABNORMAL HIGH (ref 0.44–1.00)
GFR, Estimated: 49 mL/min — ABNORMAL LOW (ref >60)
Glucose, Bld: 202 mg/dL — ABNORMAL HIGH (ref 70–99)
Potassium: 3.4 mmol/L — ABNORMAL LOW (ref 3.5–5.1)
Sodium: 137 mmol/L (ref 135–145)

## 2024-03-22 LAB — PROTIME-INR
INR: 1.1 (ref 0.8–1.2)
Prothrombin Time: 15 s (ref 11.4–15.2)

## 2024-03-22 MED ORDER — DORZOLAMIDE HCL-TIMOLOL MAL 2-0.5 % OP SOLN
1.0000 [drp] | Freq: Two times a day (BID) | OPHTHALMIC | Status: DC
  Administered 2024-03-22 – 2024-03-26 (×9): 1 [drp] via OPHTHALMIC
  Filled 2024-03-22 (×2): qty 10

## 2024-03-22 MED ORDER — LATANOPROST 0.005 % OP SOLN
1.0000 [drp] | Freq: Every day | OPHTHALMIC | Status: DC
  Administered 2024-03-22 – 2024-03-23 (×2): 1 [drp] via OPHTHALMIC
  Filled 2024-03-22 (×2): qty 2.5

## 2024-03-22 MED ORDER — AMLODIPINE BESYLATE 10 MG PO TABS
10.0000 mg | ORAL_TABLET | Freq: Every day | ORAL | Status: DC
  Administered 2024-03-22 – 2024-03-26 (×5): 10 mg via ORAL
  Filled 2024-03-22 (×6): qty 1

## 2024-03-22 MED ORDER — LACTATED RINGERS IV BOLUS
1000.0000 mL | Freq: Once | INTRAVENOUS | Status: AC
  Administered 2024-03-22: 1000 mL via INTRAVENOUS

## 2024-03-22 NOTE — Assessment & Plan Note (Signed)
 03/25/2024 Continue home eyedrops of Cosopt  and Xalatan 

## 2024-03-22 NOTE — Plan of Care (Signed)

## 2024-03-22 NOTE — Assessment & Plan Note (Addendum)
 03/25/2024 continue lipitor  80 mg daily.

## 2024-03-22 NOTE — TOC Initial Note (Addendum)
 Transition of Care [facility]) - Initial/Assessment Note    Patient Details  Name: Cassandra Brooks MRN: [id number] Date of Birth: 1940/10/19  [facility] CM/SW Contact:    Lauraine FORBES Saa, LCSWA Phone Number: 03/22/2024, 9:15 AM  Clinical Narrative:                  9:15 AM Per chart review, patient resides at home with spouse. Patient has a PCP and insurance. Patient has SNF history with [facility]. Patient has HH history with [facility], [facility], and [facility]. Patient has DME (RW, straight cane, quad cane, BSC, grab bars, manual wheelchair) history with [facility], [facility], and [facility]. Patient's preferred pharmacy's are [facility] and [phone]. PT did not have disposition recommendations for patient. No TOC needs identified at this time. TOC will continue to follow.  Expected Discharge Plan: Home/Self Care Barriers to Discharge: Continued Medical Work up   Patient Goals and CMS Choice            Expected Discharge Plan and Services       Living arrangements for the past 2 months: Single Family Home                                      Prior Living Arrangements/Services Living arrangements for the past 2 months: Single Family Home Lives with:: Spouse Patient language and need for interpreter reviewed:: Yes        Need for Family Participation in Patient Care: No (Comment)   Current home services: DME Criminal Activity/Legal Involvement Pertinent to Current Situation/Hospitalization: No - Comment as needed  Activities of Daily Living   ADL Screening (condition at time of admission) Independently performs ADLs?: No Does the patient have a NEW difficulty with bathing/dressing/toileting/self-feeding that is expected to last >3 days?: Yes (Initiates electronic notice to provider for possible OT consult) Does the patient have a NEW difficulty with getting in/out of bed, walking, or climbing stairs that is  expected to last >3 days?: Yes (Initiates electronic notice to provider for possible PT consult) Does the patient have a NEW difficulty with communication that is expected to last >3 days?: No Is the patient deaf or have difficulty hearing?: No Does the patient have difficulty seeing, even when wearing glasses/contacts?: No Does the patient have difficulty concentrating, remembering, or making decisions?: No  Permission Sought/Granted Permission sought to share information with : Family Supports Permission granted to share information with : No (Contact information on chart)  Share Information with NAME: [name]     Permission granted to share info w Relationship: Spouse  Permission granted to share info w Contact Information: [phone]  Emotional Assessment       Orientation: : Oriented to Self, Oriented to Place, Oriented to Situation, Oriented to  Time Alcohol / Substance Use: Not Applicable Psych Involvement: No (comment)  Admission diagnosis:  Pyelonephritis [N12] Near syncope [R55] Patient Active Problem List   Diagnosis Date Noted   Pyelonephritis 03/21/2024   TIA (transient ischemic attack) 08/03/2023   Obesity (BMI 30.0-34.9) 09/29/2022   CAD (coronary artery disease) 08/17/2022   ACS (acute coronary syndrome) (HCC) 08/01/2022   STEMI (ST elevation myocardial infarction) (HCC) 08/01/2022   STEMI involving right coronary artery (HCC) 07/29/2022   Spinal stenosis, lumbar region with neurogenic claudication 04/26/2021   Spinal stenosis of lumbar region with neurogenic claudication 04/26/2021  Primary osteoarthritis of left foot 10/21/2020   Plantar fasciitis 10/21/2020   Pre-diabetes    Status post total left knee replacement 03/27/2020   Essential hypertension 12/19/2019   Mixed dyslipidemia 12/19/2019   Diabetes mellitus due to underlying condition with unspecified complications (HCC) 12/19/2019   Preoperative cardiovascular examination 12/19/2019    Status post AAA (abdominal aortic aneurysm) repair 12/19/2019   Syncope and collapse    Hypertension    Hyperlipidemia    Glaucoma, both eyes    GERD (gastroesophageal reflux disease)    Fibromyalgia    Depression    Bulging lumbar disc    Borderline diabetes    Arthritis    AAA (abdominal aortic aneurysm)    Primary osteoarthritis of left knee 07/18/2019   Mixed hyperlipidemia 04/18/2017   Disequilibrium    Syncope 03/21/2017   Dizziness    Fall    Hemoptysis 03/18/2015   Solitary pulmonary nodule 03/18/2015   Nuclear cataract of both eyes 01/01/2014   PCP:  Erick Greig LABOR, NP Pharmacy:   [facility] [phone] [address] [place], [place] - [address] AT [facility] [address] [place] [address] Phone: [phone] Fax: [phone]  [facility] [address] [place] [address] Phone: [phone] Fax: [phone]     Social Drivers of Health (SDOH) Social History: SDOH Screenings   Food Insecurity: No Food Insecurity (03/21/2024)  Housing: Low Risk (03/21/2024)  Transportation Needs: No Transportation Needs (03/21/2024)  Utilities: Not At Risk (03/21/2024)  Social Connections: Socially Integrated (03/21/2024)  Tobacco Use: Low Risk (03/21/2024)   SDOH Interventions:     Readmission Risk Interventions     No data to display

## 2024-03-22 NOTE — Hospital Course (Signed)
 CC: syncope HPI: Cassandra Brooks is a 83 y.o. female with medical history significant of CAD, hypertension, hyperlipidemia who presented to the emergency department due to back pain.  Patient's family reported that she has been having progressively worsening pain over the last 2 weeks.  She also has had intermittent episodes of vomiting.  PMH CAD, HTN, HLD, chronic intermittent diarrhea who presented with recently worsening back pain along with nausea and vomiting.  Appetite has been okay however has vomiting soon after eating but denies any dysphagia or regurgitation symptoms.  She has had ongoing issues with intermittent diarrhea to the point of becoming incontinent at times.  She has not undergone colonoscopy for workup for this.  She presents emergency department where she was found to be afebrile and hemodynamically stable.  Labs were obtained on presentation which showed CMP unrevealing, WBC 7.0, hemoglobin 14.4, lipase 27, urinalysis concerning for infection.  Patient underwent CT abdomen which showed right sided pyelonephritis.  Patient was given on ceftriaxone  and admitted further workup.   Significant Events: Admitted 03/21/2024 for acute pyelonephritis 03-23-2024 Pt consented for Hospital at [facility] program. Will be transferred to Hospital at [facility] program on 03-24-2024. 03-24-2024 transferred to Hospital at Home program   Admission Labs: Na 138, K 3.7, CO2 of 27, BUN 23, SCr 0.95, glu 161 WBC 7.0, HgB 14.4, plt 217 Lipase 27 UA cloudy, large LE, WBC >50, bacteria many, negative nitrite, 0-5 squamous cells.  Admission Imaging Studies: CT abd/pelvis Findings concerning for right-sided pyelonephritis. Correlation with urinalysis recommended. No abscess. 2. Sigmoid diverticulosis.  No bowel obstruction. 3.  Aortic Atherosclerosis CT L-spine No acute/traumatic lumbar spine pathology. 2. Extensive multilevel degenerative changes.  Significant Labs: Vit B12 of 2024 Folate >20  Significant  Imaging Studies: K 3.4 on 12/14 (replaced) >> pending  Procedures: None  Consultants: None

## 2024-03-22 NOTE — Assessment & Plan Note (Addendum)
 03/22/2024 - Seemingly chronic and intermittent at home.  Low suspicion for acute infection at this time. -Encouraged her to continue discussions outpatient with primary care or GI to consider colonoscopy to further evaluate  03/23/2024 chronic in nature. Will need PCP follow up and outpatient GI referral.  03/24/2024 no complaints of diarrhea. Watch for worsening bowel function with IV abx.

## 2024-03-22 NOTE — Assessment & Plan Note (Addendum)
 03/22/2024 - Complaining of right sided low back pain prior to admission and found to have right sided pyelonephritis on CT.  Urinalysis consistent with infection - Continue Rocephin  - Follow-up urine culture  03/23/2024 unfortunately, urine cx was an add-on test and it looks like it was never sent. No blood cx were sent. Given her pyuria and pre-syncope symptoms with right flank pain, she will need a minimum of 5-7 days of IV abx since we have no culture data. Continue with IV Rocephin  2 grams daily. Today is day # 3 of therapy.  03/24/2024 confirmed that Urine cx not sent from ER UA sample. c Still with right flank pain. I still think she will need a minimum of 5-7 days of IV Rocephin  given no culture data. Pt will be transferred to [facility] program today.  12/15 -- Day #5 IV Rocephin .  No fever/chills.  N/V improved.  Still with intermittent flank pain, but improving.  Will continue IV antibiotic for today and re-assess tomorrow if appropriate to transition to PO antibiotics.

## 2024-03-22 NOTE — Assessment & Plan Note (Addendum)
 03/25/2024 continue Effexor -XR

## 2024-03-22 NOTE — Assessment & Plan Note (Addendum)
 03/22/2024 Continue amlodipine  and ARB  03/23/2024 holding hydrochlorothiazide  for now given her recent N/V. Pt is eating and drinking well now.  Can restart her hydrochlorothiazide  after she is discharged.  03/24/2024 BP stable but slightly on the rise. Can consider restarting hydrochlorothiazide  soon. Continue norvasc  and ARB for now.  03/25/24 - BP elevated today 177/77, AM meds had not been given yet.  Will re-assess BP after meds in effect.  Will plan to resume hydrochlorothiazide  if BP still elevated

## 2024-03-22 NOTE — Assessment & Plan Note (Signed)
 -  Continue Lipitor

## 2024-03-22 NOTE — Evaluation (Signed)
 Occupational Therapy Evaluation Patient Details Name: Cassandra Brooks MRN: [id number] DOB: [birth year] Today's Date: 03/22/2024   History of Present Illness   83 y.o. female admitted 03/21/24 with worsening back pain, vomiting. Workup for R pyelonephritis. PMH includes CAD, HTN, HLD, syncope, L TKA, fibromyalgia, glaucoma, depression.     Clinical Impressions Pt resting comfortably in bed, family present. Pt lives at home with husband who is available as needed, PLOF independent, furniture walks around home, uses [facility] in community. Pt reports frequent falls when picking up walnuts in front yard, but able to sit down when she gets dizzy from bending over and standing too quickly, no injuries, able to get back up independently. Pt currently likely close to baseline, overall mod I for ADLs and ambulation without AD 75 feet. Pt has no further acute OT needs, no follow up needed. Pt has all DME at home needed to remain safe/independent.      If plan is discharge home, recommend the following:   Assist for transportation     Functional Status Assessment   Patient has had a recent decline in their functional status and demonstrates the ability to make significant improvements in function in a reasonable and predictable amount of time.     Equipment Recommendations   None recommended by OT     Recommendations for Other Services         Precautions/Restrictions   Precautions Precautions: Fall;Other (comment) Recall of Precautions/Restrictions: Intact Precaution/Restrictions Comments: reports h/o IBS with bowel urgency (brought depends from home) Restrictions Weight Bearing Restrictions Per Provider Order: No     Mobility Bed Mobility Overal bed mobility: Modified Independent                  Transfers Overall transfer level: Modified independent                        Balance Overall balance assessment: Mild deficits observed, not formally tested                                          ADL either performed or assessed with clinical judgement   ADL Overall ADL's : At baseline;Modified independent                                             Vision Baseline Vision/History: 3 Glaucoma;1 Wears glasses Ability to See in Adequate Light: 0 Adequate Patient Visual Report: No change from baseline       Perception         Praxis         Pertinent Vitals/Pain Pain Assessment Pain Assessment: 0-10 Pain Score: 2  Pain Location: abdomen Pain Descriptors / Indicators: Sore Pain Intervention(s): Monitored during session     Extremity/Trunk Assessment Upper Extremity Assessment Upper Extremity Assessment: Overall WFL for tasks assessed   Lower Extremity Assessment Lower Extremity Assessment: Defer to PT evaluation       Communication Communication Communication: Impaired   Cognition Arousal: Alert Behavior During Therapy: WFL for tasks assessed/performed Cognition: No apparent impairments                               Following commands: Intact  Cueing  General Comments   Cueing Techniques: Verbal cues      Exercises     Shoulder Instructions      Home Living Family/patient expects to be discharged to:: Private residence Living Arrangements: Spouse/significant other Available Help at Discharge: Family;Neighbor;Available 24 hours/day Type of Home: House Home Access: Stairs to enter;Ramped entrance Entrance Stairs-Number of Steps: 2 Entrance Stairs-Rails: Left Home Layout: One level     Bathroom Shower/Tub: Producer, Television/film/video: Handicapped height     Home Equipment: Agricultural Consultant (2 wheels);Cane - quad;Cane - single point;BSC/3in1;Grab bars - tub/shower;Wheelchair - manual   Additional Comments: BSC over toilet. place a chair in their shower. lives with husband who uses RW since recent stroke; daughter lives 2 miles away and assists  as needed      Prior Functioning/Environment Prior Level of Function : Independent/Modified Independent;History of Falls (last six months)             Mobility Comments: mod indep without DME in house, furniture surfs; uses [facility] for community mobility. my husband won't let me drive, he drives ADLs Comments: mod indep with ADL/iADLs; daughter available to assist if needed    OT Problem List: Impaired balance (sitting and/or standing);Decreased activity tolerance   OT Treatment/Interventions:        OT Goals(Current goals can be found in the care plan section)   Acute Rehab OT Goals Patient Stated Goal: to return home OT Goal Formulation: With patient/family Time For Goal Achievement: 04/05/24 Potential to Achieve Goals: Good   OT Frequency:       Co-evaluation              AM-PAC OT 6 Clicks Daily Activity     Outcome Measure Help from another person eating meals?: None Help from another person taking care of personal grooming?: None Help from another person toileting, which includes using toliet, bedpan, or urinal?: None Help from another person bathing (including washing, rinsing, drying)?: None Help from another person to put on and taking off regular upper body clothing?: None Help from another person to put on and taking off regular lower body clothing?: None 6 Click Score: 24   End of Session Equipment Utilized During Treatment: Gait belt Nurse Communication: Mobility status  Activity Tolerance: Patient tolerated treatment well Patient left: in bed;with call bell/phone within reach;with family/visitor present  OT Visit Diagnosis: Muscle weakness (generalized) (M62.81);Other abnormalities of gait and mobility (R26.89);History of falling (Z91.81)                Time: 8756-8694 OT Time Calculation (min): 22 min Charges:  OT General Charges $OT Visit: 1 Visit OT Evaluation $OT Eval Low Complexity: 1 Low  [address], OTR/L   Cassandra Brooks 03/22/2024, 1:17 PM

## 2024-03-22 NOTE — Progress Notes (Signed)
 Progress Note    Cassandra Brooks   FMW:[id number]  DOB: April 17, 1940  DOA: 03/21/2024     1 PCP: Cassandra Brooks LABOR, NP  Initial CC: back pain, N/V  Hospital Course: Cassandra Brooks is an 83 year old female with PMH CAD, HTN, HLD, chronic intermittent diarrhea who presented with recently worsening back pain along with nausea and vomiting.  Appetite has been okay however has vomiting soon after eating but denies any dysphagia or regurgitation symptoms.  She has had ongoing issues with intermittent diarrhea to the point of becoming incontinent at times.  She has not undergone colonoscopy for workup for this. CT A/P was performed given back pain symptoms and showed right sided pyelonephritis.  UA was also noted with large LE, negative nitrite, greater than 50 WBC, many bacteria, hyaline casts. She was started on fluids and Rocephin and admitted for ongoing monitoring.  Interval History:  Family present bedside this morning including daughter and patient's husband.  Cassandra Brooks sitting in recliner resting comfortably feeling a little bit better today.  Right lower back pain has improved since admission. No further nausea or vomiting since admission.  Assessment and Plan: * Pyelonephritis - Complaining of right sided low back pain prior to admission and found to have right sided pyelonephritis on CT.  Urinalysis consistent with infection - Continue Rocephin - Follow-up urine culture  Diarrhea - Seemingly chronic and intermittent at home.  Low suspicion for acute infection at this time -Encouraged her to continue discussions outpatient with primary care or GI to consider colonoscopy to further evaluate  CAD (coronary artery disease) - On aspirin , Plavix , Lipitor , Lopressor , ARB  Diabetes mellitus due to underlying condition with unspecified complications (HCC) - No home meds.  Last A1c was controlled earlier this year - CBG checks for now  Essential hypertension - Continue amlodipine  and  ARB  Depression - Continue Effexor   Glaucoma, both eyes - Continue home eyedrops  Hyperlipidemia - Continue Lipitor    Antimicrobials: Rocephin 03/21/2024 >> current  DVT prophylaxis:  enoxaparin  (LOVENOX ) injection 40 mg Start: 03/22/24 1000 SCDs Start: 03/21/24 2322   Code Status:   Code Status: Full Code  Mobility Assessment (Last 72 Hours)     Mobility Assessment     Row Name 03/22/24 0853 03/22/24 0820 03/21/24 2322       Does the patient have exclusion criteria? -- No- Perform mobility assessment No- Perform mobility assessment     What is the highest level of mobility based on the mobility assessment? Level 4 (Ambulates with assistance) - Balance while stepping forward/back - Complete Level 4 (Ambulates with assistance) - Balance while stepping forward/back - Complete Level 4 (Ambulates with assistance) - Balance while stepping forward/back - Complete        Diet: Diet Orders (From admission, onward)     Start     Ordered   03/21/24 2322  Diet regular Room service appropriate? Yes; Fluid consistency: Thin  Diet effective now       Question Answer Comment  Room service appropriate? Yes   Fluid consistency: Thin      03/21/24 2336            Barriers to discharge: none Disposition Plan:  Home HH orders placed: n/a Status is: Inpt  Objective: Blood pressure (!) 194/82, pulse 88, temperature 98 F (36.7 C), temperature source Oral, resp. rate 18, height 5' 1 (1.549 m), weight 74.6 kg, SpO2 96%.  Examination:  Physical Exam Constitutional:      Appearance: Normal appearance.  HENT:  Head: Normocephalic and atraumatic.     Mouth/Throat:     Mouth: Mucous membranes are moist.  Eyes:     Extraocular Movements: Extraocular movements intact.  Cardiovascular:     Rate and Rhythm: Normal rate and regular rhythm.  Pulmonary:     Effort: Pulmonary effort is normal. No respiratory distress.     Breath sounds: Normal breath sounds. No wheezing.   Abdominal:     General: Bowel sounds are normal. There is no distension.     Palpations: Abdomen is soft.     Tenderness: There is no abdominal tenderness. There is right CVA tenderness.  Musculoskeletal:        General: Normal range of motion.     Cervical back: Normal range of motion and neck supple.  Skin:    General: Skin is warm and dry.  Neurological:     General: No focal deficit present.     Mental Status: She is alert.  Psychiatric:        Mood and Affect: Mood normal.      Consultants:    Procedures:    Data Reviewed: Results for orders placed or performed during the hospital encounter of 03/21/24 (from the past 24 hours)  Comprehensive metabolic panel     Status: Abnormal   Collection Time: 03/21/24  3:11 PM  Result Value Ref Range   Sodium 138 135 - 145 mmol/L   Potassium 3.7 3.5 - 5.1 mmol/L   Chloride 101 98 - 111 mmol/L   CO2 27 22 - 32 mmol/L   Glucose, Bld 161 (H) 70 - 99 mg/dL   BUN 23 8 - 23 mg/dL   Creatinine, Ser 9.04 0.44 - 1.00 mg/dL   Calcium  9.1 8.9 - 10.3 mg/dL   Total Protein 7.9 6.5 - 8.1 g/dL   Albumin 3.3 (L) 3.5 - 5.0 g/dL   AST 24 15 - 41 U/L   ALT 19 0 - 44 U/L   Alkaline Phosphatase 78 38 - 126 U/L   Total Bilirubin 1.1 0.0 - 1.2 mg/dL   GFR, Estimated 59 (L) >60 mL/min   Anion gap 10 5 - 15  CBC with Differential     Status: Abnormal   Collection Time: 03/21/24  3:11 PM  Result Value Ref Range   WBC 7.0 4.0 - 10.5 K/uL   RBC 4.12 3.87 - 5.11 MIL/uL   Hemoglobin 14.4 12.0 - 15.0 g/dL   HCT 56.0 63.9 - 53.9 %   MCV 106.6 (H) 80.0 - 100.0 fL   MCH 35.0 (H) 26.0 - 34.0 pg   MCHC 32.8 30.0 - 36.0 g/dL   RDW 87.5 88.4 - 84.4 %   Platelets 217 150 - 400 K/uL   nRBC 0.0 0.0 - 0.2 %   Neutrophils Relative % 58 %   Neutro Abs 4.2 1.7 - 7.7 K/uL   Lymphocytes Relative 27 %   Lymphs Abs 1.9 0.7 - 4.0 K/uL   Monocytes Relative 10 %   Monocytes Absolute 0.7 0.1 - 1.0 K/uL   Eosinophils Relative 4 %   Eosinophils Absolute 0.3 0.0 -  0.5 K/uL   Basophils Relative 1 %   Basophils Absolute 0.1 0.0 - 0.1 K/uL   Immature Granulocytes 0 %   Abs Immature Granulocytes 0.01 0.00 - 0.07 K/uL  Lipase, blood     Status: None   Collection Time: 03/21/24  3:11 PM  Result Value Ref Range   Lipase 27 11 - 51 U/L  Urinalysis, Routine  w reflex microscopic -Urine, Clean Catch     Status: Abnormal   Collection Time: 03/21/24  4:13 PM  Result Value Ref Range   Color, Urine YELLOW YELLOW   APPearance CLOUDY (A) CLEAR   Specific Gravity, Urine 1.016 1.005 - 1.030   pH 5.0 5.0 - 8.0   Glucose, UA NEGATIVE NEGATIVE mg/dL   Hgb urine dipstick NEGATIVE NEGATIVE   Bilirubin Urine NEGATIVE NEGATIVE   Ketones, ur NEGATIVE NEGATIVE mg/dL   Protein, ur NEGATIVE NEGATIVE mg/dL   Nitrite NEGATIVE NEGATIVE   Leukocytes,Ua LARGE (A) NEGATIVE   RBC / HPF 0-5 0 - 5 RBC/hpf   WBC, UA >50 0 - 5 WBC/hpf   Bacteria, UA MANY (A) NONE SEEN   Squamous Epithelial / HPF 0-5 0 - 5 /HPF   WBC Clumps PRESENT    Mucus PRESENT    Hyaline Casts, UA PRESENT   CBC     Status: Abnormal   Collection Time: 03/22/24  3:54 AM  Result Value Ref Range   WBC 10.8 (H) 4.0 - 10.5 K/uL   RBC 3.85 (L) 3.87 - 5.11 MIL/uL   Hemoglobin 13.6 12.0 - 15.0 g/dL   HCT 59.9 63.9 - 53.9 %   MCV 103.9 (H) 80.0 - 100.0 fL   MCH 35.3 (H) 26.0 - 34.0 pg   MCHC 34.0 30.0 - 36.0 g/dL   RDW 87.3 88.4 - 84.4 %   Platelets 192 150 - 400 K/uL   nRBC 0.0 0.0 - 0.2 %  Basic metabolic panel     Status: Abnormal   Collection Time: 03/22/24  3:54 AM  Result Value Ref Range   Sodium 137 135 - 145 mmol/L   Potassium 3.4 (L) 3.5 - 5.1 mmol/L   Chloride 101 98 - 111 mmol/L   CO2 25 22 - 32 mmol/L   Glucose, Bld 202 (H) 70 - 99 mg/dL   BUN 23 8 - 23 mg/dL   Creatinine, Ser 8.87 (H) 0.44 - 1.00 mg/dL   Calcium  8.5 (L) 8.9 - 10.3 mg/dL   GFR, Estimated 49 (L) >60 mL/min   Anion gap 11 5 - 15  Protime-INR     Status: None   Collection Time: 03/22/24  6:52 AM  Result Value Ref Range    Prothrombin Time 15.0 11.4 - 15.2 seconds   INR 1.1 0.8 - 1.2    I have reviewed pertinent nursing notes, vitals, labs, and images as necessary. I have ordered labwork to follow up on as indicated.  I have reviewed the last notes from staff over past 24 hours. I have discussed patient's care plan and test results with nursing staff, CM/SW, and other staff as appropriate.  Old records reviewed in assessment of this patient  Time spent: Greater than 50% of the 55 minute visit was spent in counseling/coordination of care for the patient as laid out in the A&P.   LOS: 1 day   Alm Apo, MD [facility] 03/22/2024, 1:09 PM

## 2024-03-22 NOTE — Assessment & Plan Note (Addendum)
 03/25/2024 continue ASA, plavix , lipitor , lopressor , avapro .

## 2024-03-22 NOTE — Assessment & Plan Note (Addendum)
 03/22/2024 - No home meds.  Last A1c was controlled earlier this year. CBG checks for now  03/25/2024 stable. Non-fasting glucose 112 on labs.

## 2024-03-22 NOTE — Evaluation (Signed)
 Physical Therapy Evaluation Patient Details Name: Cassandra Brooks MRN: [id number] DOB: 07-28-1940 Today's Date: 03/22/2024  History of Present Illness  83 y.o. female admitted 03/21/24 with worsening back pain, vomiting. Workup for R pyelonephritis. PMH includes CAD, HTN, HLD, syncope, L TKA, fibromyalgia, glaucoma, depression.  Clinical Impression  Pt presents with an overall decrease in functional mobility secondary to above. PTA, pt mod indep with SPC for community mobility, lives with husband and daughter lives nearby available to assist PRN. Today, pt able to ambulate at supervision-level; reports balance and speech deficits since prior CVA 07/2023. Pt would benefit from continued acute PT services to maximize functional mobility and independence prior to d/c home.       If plan is discharge home, recommend the following: Assistance with cooking/housework;Assist for transportation;Help with stairs or ramp for entrance   Can travel by private vehicle    Yes    Equipment Recommendations None recommended by PT  Recommendations for Other Services       Functional Status Assessment Patient has had a recent decline in their functional status and demonstrates the ability to make significant improvements in function in a reasonable and predictable amount of time.     Precautions / Restrictions Precautions Precautions: Fall;Other (comment) Recall of Precautions/Restrictions: Intact Precaution/Restrictions Comments: reports h/o IBS with bowel urgency (brought depends from home) Restrictions Weight Bearing Restrictions Per Provider Order: No      Mobility  Bed Mobility Overal bed mobility: Modified Independent             General bed mobility comments: HOB elevated    Transfers Overall transfer level: Independent Equipment used: None               General transfer comment: multiple sit<>stands from EOB (4x), recliner (2x) and low toilet height (1x) indep without  DME    Ambulation/Gait Ambulation/Gait assistance: Supervision Gait Distance (Feet): 200 Feet Assistive device: None Gait Pattern/deviations: Step-through pattern, Decreased stride length Gait velocity: Decreased     General Gait Details: pt walking to/from bathroom and throughout room without DME, intermittently reaching to objects/furniture for UE support; hallway ambulation with increased guarded gait, pt preference for handrail for UE support, supervision for safety  Stairs            Wheelchair Mobility     Tilt Bed    Modified Rankin (Stroke Patients Only)       Balance Overall balance assessment: Needs assistance Sitting-balance support: No upper extremity supported, Feet supported Sitting balance-Leahy Scale: Good Sitting balance - Comments: indep to don depends, indep for toileting/pericare   Standing balance support: No upper extremity supported, During functional activity Standing balance-Leahy Scale: Fair                               Pertinent Vitals/Pain Pain Assessment Pain Assessment: Faces Faces Pain Scale: Hurts a little bit Pain Location: abdomen Pain Descriptors / Indicators: Sore Pain Intervention(s): Monitored during session    Home Living Family/patient expects to be discharged to:: Private residence Living Arrangements: Spouse/significant other Available Help at Discharge: Family;Neighbor Type of Home: House Home Access: Stairs to enter Entrance Stairs-Rails: Left Entrance Stairs-Number of Steps: 2   Home Layout: One level Home Equipment: Agricultural Consultant (2 wheels);Cane - quad;Cane - single point;BSC/3in1;Grab bars - tub/shower;Wheelchair - manual Additional Comments: BSC over toilet. place a chair in their shower. lives with husband who uses RW since recent stroke; daughter lives 2 miles  away and assists as needed    Prior Function Prior Level of Function : Independent/Modified Independent;History of Falls (last six  months)             Mobility Comments: mod indep without DME in house, furniture surfs; uses [facility] for community mobility. my husband won't let me drive, he drives ADLs Comments: mod indep with ADL/iADLs; daughter available to assist if needed     Extremity/Trunk Assessment   Upper Extremity Assessment Upper Extremity Assessment: Overall WFL for tasks assessed    Lower Extremity Assessment Lower Extremity Assessment: Overall WFL for tasks assessed    Cervical / Trunk Assessment Cervical / Trunk Assessment: Normal  Communication   Communication Communication: Impaired Factors Affecting Communication: Difficulty expressing self (some expressive aphasia; pt reports word finding/speech difficulties since stroke)    Cognition Arousal: Alert Behavior During Therapy: WFL for tasks assessed/performed   PT - Cognitive impairments: No apparent impairments                                 Cueing       General Comments General comments (skin integrity, edema, etc.): educ re: role of acute PT, POC, activity recommendations, importance of mobility, fall risk reduction, DME use, potential d/c needs    Exercises     Assessment/Plan    PT Assessment Patient needs continued PT services  PT Problem List Decreased activity tolerance;Decreased balance;Decreased mobility       PT Treatment Interventions DME instruction;Gait training;Stair training;Functional mobility training;Therapeutic activities;Therapeutic exercise;Balance training;Patient/family education    PT Goals (Current goals can be found in the Care Plan section)  Acute Rehab PT Goals Patient Stated Goal: return home PT Goal Formulation: With patient Time For Goal Achievement: 04/05/24 Potential to Achieve Goals: Good    Frequency Min 1X/week     Co-evaluation               AM-PAC PT 6 Clicks Mobility  Outcome Measure Help needed turning from your back to your side while in a flat bed  without using bedrails?: None Help needed moving from lying on your back to sitting on the side of a flat bed without using bedrails?: None Help needed moving to and from a bed to a chair (including a wheelchair)?: None Help needed standing up from a chair using your arms (e.g., wheelchair or bedside chair)?: None Help needed to walk in hospital room?: A Little Help needed climbing 3-5 steps with a railing? : A Little 6 Click Score: 22    End of Session Equipment Utilized During Treatment: Gait belt Activity Tolerance: Patient tolerated treatment well Patient left: in chair;with call bell/phone within reach;with chair alarm set Nurse Communication: Mobility status PT Visit Diagnosis: Other abnormalities of gait and mobility (R26.89)    Time: 9243-9183 PT Time Calculation (min) (ACUTE ONLY): 20 min   Charges:   PT Evaluation $PT Eval Low Complexity: 1 Low   PT General Charges $$ ACUTE PT VISIT: 1 Visit     Darice Almas, PT, DPT Acute Rehabilitation Services  Personal: Secure Chat Rehab Office: [phone]  Darice LITTIE Almas 03/22/2024, 8:54 AM

## 2024-03-23 DIAGNOSIS — N10 Acute tubulo-interstitial nephritis: Principal | ICD-10-CM

## 2024-03-23 LAB — BASIC METABOLIC PANEL WITH GFR
Anion gap: 6 (ref 5–15)
BUN: 18 mg/dL (ref 8–23)
CO2: 25 mmol/L (ref 22–32)
Calcium: 8.2 mg/dL — ABNORMAL LOW (ref 8.9–10.3)
Chloride: 106 mmol/L (ref 98–111)
Creatinine, Ser: 1.09 mg/dL — ABNORMAL HIGH (ref 0.44–1.00)
GFR, Estimated: 50 mL/min — ABNORMAL LOW (ref >60)
Glucose, Bld: 119 mg/dL — ABNORMAL HIGH (ref 70–99)
Potassium: 3.5 mmol/L (ref 3.5–5.1)
Sodium: 137 mmol/L (ref 135–145)

## 2024-03-23 LAB — CBC WITH DIFFERENTIAL/PLATELET
Abs Immature Granulocytes: 0.03 K/uL (ref 0.00–0.07)
Basophils Absolute: 0 K/uL (ref 0.0–0.1)
Basophils Relative: 1 %
Eosinophils Absolute: 0.1 K/uL (ref 0.0–0.5)
Eosinophils Relative: 2 %
HCT: 38.1 % (ref 36.0–46.0)
Hemoglobin: 12.9 g/dL (ref 12.0–15.0)
Immature Granulocytes: 1 %
Lymphocytes Relative: 21 %
Lymphs Abs: 1.2 K/uL (ref 0.7–4.0)
MCH: 34.9 pg — ABNORMAL HIGH (ref 26.0–34.0)
MCHC: 33.9 g/dL (ref 30.0–36.0)
MCV: 103 fL — ABNORMAL HIGH (ref 80.0–100.0)
Monocytes Absolute: 0.4 K/uL (ref 0.1–1.0)
Monocytes Relative: 6 %
Neutro Abs: 4.1 K/uL (ref 1.7–7.7)
Neutrophils Relative %: 69 %
Platelets: 171 K/uL (ref 150–400)
RBC: 3.7 MIL/uL — ABNORMAL LOW (ref 3.87–5.11)
RDW: 12.8 % (ref 11.5–15.5)
WBC: 5.9 K/uL (ref 4.0–10.5)
nRBC: 0 % (ref 0.0–0.2)

## 2024-03-23 LAB — GLUCOSE, CAPILLARY
Glucose-Capillary: 129 mg/dL — ABNORMAL HIGH (ref 70–99)
Glucose-Capillary: 138 mg/dL — ABNORMAL HIGH (ref 70–99)

## 2024-03-23 LAB — FOLATE: Folate: >20 ng/mL (ref >5.9)

## 2024-03-23 LAB — MAGNESIUM: Magnesium: 1.5 mg/dL — ABNORMAL LOW (ref 1.7–2.4)

## 2024-03-23 LAB — VITAMIN B12: Vitamin B-12: 2024 pg/mL — ABNORMAL HIGH (ref 180–914)

## 2024-03-23 MED ORDER — MAGNESIUM SULFATE 2 GM/50ML IV SOLN
2.0000 g | Freq: Once | INTRAVENOUS | Status: AC
  Administered 2024-03-23: 2 g via INTRAVENOUS
  Filled 2024-03-23: qty 50

## 2024-03-23 MED ORDER — ENSURE PLUS HIGH PROTEIN PO LIQD
237.0000 mL | Freq: Two times a day (BID) | ORAL | Status: DC
  Administered 2024-03-24 – 2024-03-26 (×3): 237 mL via ORAL
  Filled 2024-03-23 (×8): qty 237

## 2024-03-23 NOTE — Subjective & Objective (Signed)
 Cassandra Brooks seen and examined. Met with Cassandra Brooks, husband(ozzie) and dtr)(renita) at bedside. Overall Cassandra Brooks is feeling better. No N/V. She has chronic back pain due to spinal stenosis and bulging discs. Her right flank pain has improved. Urine cx was ordered as add-on from ER UA sample due it appears that it was never sent. Remains on IV Rocephin  and tolerating it.  I explained [facility] program to Cassandra Brooks, her husband Ozzie and dtr [name]. They are all interested in program. Cassandra Brooks lives at [address] [place], [place], [place] [address].  Her listed address is [employer] is for her dtr [name]. Dtr pays all of the patient's bills and all of Cassandra Brooks's mail goes to Cassandra Brooks's address.  Cassandra Brooks, husband and Dtr all agree with concept of [facility]. She has already worked with Cassandra Brooks/OT and does not have any Cassandra Brooks/OT needs. Cassandra Brooks does not smoke. Husband knows how to use smartphone. Dtr is available for video calls. Cassandra Brooks does not have internet access at home but assured them that [facility] does not require them to have internet access as we will bring our own wifi hotspot.  Written consent obtained.  Due to staffing issues, Cassandra Brooks will be transferred to Hospital at [facility] tomorrow morning 03-24-2024.

## 2024-03-23 NOTE — Plan of Care (Signed)

## 2024-03-23 NOTE — Assessment & Plan Note (Addendum)
 03/25/2024 Body mass index is 31.08 kg/m.

## 2024-03-23 NOTE — Assessment & Plan Note (Signed)
 03/23/2024 repleted today with IV Mg. Check Mg in AM. 03/24/2024 resolved. Mg 2.0 today 12/15 - no repeat Mg level needed

## 2024-03-23 NOTE — Progress Notes (Signed)
 PROGRESS NOTE    Cassandra Brooks  FMW:[id number] DOB: [birth year] DOA: 03/21/2024 PCP: Erick Greig LABOR, NP   Brief Narrative: This 83 yrs old female with PMH significant for CAD, hypertension, hyperlipidemia, chronic intermittent diarrhea and back pain presented in the ED with worsening back pain.  She has ongoing issues with intermittent diarrhea to the point of becoming incontinent at times.  She has not undergone colonoscopy.  CT A&P shows right-sided pyelonephritis.  UA also noted with large LE, negative nitrites , WBC 50 and many bacteria.  Patient was admitted for further evaluation and started on empiric antibiotics ceftriaxone .  Assessment & Plan:   Principal Problem:   Acute pyelonephritis Active Problems:   Hypomagnesemia   Glaucoma, both eyes   Depression, major, recurrent, in complete remission   Bulging lumbar disc   Essential hypertension   Diabetes mellitus due to underlying condition with unspecified complications (HCC)   Mixed hyperlipidemia   CAD (coronary artery disease)   Obesity (BMI 30.0-34.9)   Diarrhea  Pyelonephritis: Patient presented with right sided lower back pain and found to have right sided pyelonephritis on CT.   Urinalysis consistent with infection. - Continue Rocephin  - Follow-up urine culture   Intermittent Diarrhea: Seemingly chronic and intermittent at home.  Low suspicion for acute infection at this time Encouraged her to continue outpatient discussion with primary care or GI to consider colonoscopy to further evaluate. Reports diarrhea has improved.   CAD (coronary artery disease): Continue  aspirin , Plavix , Lipitor , Lopressor , ARB.   Diabetes mellitus due to underlying condition with unspecified complications (HCC) No home meds.  Last A1c was controlled earlier this year. CBG checks for now   Essential hypertension: Continue amlodipine  and ARB.   Depression: Continue Effexor .   Glaucoma, both eyes Continue home eyedrops.    Hyperlipidemia: Continue Lipitor .    DVT prophylaxis: Lovenox  Code Status: Full code Family Communication: Family at bedside Disposition Plan:    Status is: Inpatient Remains inpatient appropriate because: Admitted for acute pyelonephritis.    Patient will be taken over by hospitalist at home program tomorrow.    Consultants:  None  Procedures: CT A/P Antimicrobials: Anti-infectives (From admission, onward)    Start     Dose/Rate Route Frequency Ordered Stop   03/22/24 1700  cefTRIAXone  (ROCEPHIN ) 2 g in sodium chloride  0.9 % 100 mL IVPB        2 g 200 mL/hr over 30 Minutes Intravenous Every 24 hours 03/21/24 2337     03/21/24 1745  cefTRIAXone  (ROCEPHIN ) 2 g in sodium chloride  0.9 % 100 mL IVPB        2 g 200 mL/hr over 30 Minutes Intravenous  Once 03/21/24 1741 03/21/24 1908      Subjective: Patient was seen and examined at bedside.  Overnight events noted. Patient reports feeling improved.  Denies any nausea, abdominal pain is improving.  Objective: Vitals:   03/22/24 1539 03/22/24 2026 03/23/24 0628 03/23/24 0808  BP: (!) 141/53 (!) 144/115 (!) 145/60 (!) 142/56  Pulse: 76 (!) 109 80 84  Resp:  16 16 16   Temp: 98.4 F (36.9 C) 99.2 F (37.3 C) 98.1 F (36.7 C) 98.2 F (36.8 C)  TempSrc: Oral Oral  Oral  SpO2: 95% 98% 92% 95%  Weight:      Height:        Intake/Output Summary (Last 24 hours) at 03/23/2024 1534 Last data filed at 03/23/2024 0841 Gross per 24 hour  Intake 387 ml  Output --  Net 387  ml   Filed Weights   03/21/24 1835  Weight: 74.6 kg    Examination:  General exam: Appears calm and comfortable, not in any acute distress. Respiratory system: Clear to auscultation. Respiratory effort normal.  RR 14 Cardiovascular system: S1 & S2 heard, RRR. No JVD, murmurs, rubs, gallops or clicks.  Gastrointestinal system: Abdomen is non distended, soft and non tender.  Normal bowel sounds heard. Central nervous system: Alert and oriented x 3. No  focal neurological deficits. Extremities: No edema, no cyanosis, no clubbing. Skin: No rashes, lesions or ulcers Psychiatry: Judgement and insight appear normal. Mood & affect appropriate.     Data Reviewed: I have personally reviewed following labs and imaging studies  CBC: Recent Labs  Lab 03/21/24 1511 03/22/24 0354 03/23/24 0417  WBC 7.0 10.8* 5.9  NEUTROABS 4.2  --  4.1  HGB 14.4 13.6 12.9  HCT 43.9 40.0 38.1  MCV 106.6* 103.9* 103.0*  PLT 217 192 171   Basic Metabolic Panel: Recent Labs  Lab 03/21/24 1511 03/22/24 0354 03/23/24 0417  NA 138 137 137  K 3.7 3.4* 3.5  CL 101 101 106  CO2 27 25 25   GLUCOSE 161* 202* 119*  BUN 23 23 18   CREATININE 0.95 1.12* 1.09*  CALCIUM  9.1 8.5* 8.2*  MG  --   --  1.5*   GFR: Estimated Creatinine Clearance: 36.1 mL/min (A) (by C-G formula based on SCr of 1.09 mg/dL (H)). Liver Function Tests: Recent Labs  Lab 03/21/24 1511  AST 24  ALT 19  ALKPHOS 78  BILITOT 1.1  PROT 7.9  ALBUMIN 3.3*   Recent Labs  Lab 03/21/24 1511  LIPASE 27   No results for input(s): AMMONIA in the last 168 hours. Coagulation Profile: Recent Labs  Lab 03/22/24 0652  INR 1.1   Cardiac Enzymes: No results for input(s): CKTOTAL, CKMB, CKMBINDEX, TROPONINI in the last 168 hours. BNP (last 3 results) No results for input(s): PROBNP in the last 8760 hours. HbA1C: No results for input(s): HGBA1C in the last 72 hours. CBG: Recent Labs  Lab 03/23/24 1207  GLUCAP 129*   Lipid Profile: No results for input(s): CHOL, HDL, LDLCALC, TRIG, CHOLHDL, LDLDIRECT in the last 72 hours. Thyroid  Function Tests: No results for input(s): TSH, T4TOTAL, FREET4, T3FREE, THYROIDAB in the last 72 hours. Anemia Panel: Recent Labs    03/23/24 0417  VITAMINB12 2,024*  FOLATE >20.0   Sepsis Labs: No results for input(s): PROCALCITON, LATICACIDVEN in the last 168 hours.  No results found for this or any previous  visit (from the past 240 hours).   Radiology Studies: CT ABDOMEN PELVIS W CONTRAST Result Date: 03/21/2024 CLINICAL DATA:  Abdominal pain. EXAM: CT ABDOMEN AND PELVIS WITH CONTRAST TECHNIQUE: Multidetector CT imaging of the abdomen and pelvis was performed using the standard protocol following bolus administration of intravenous contrast. RADIATION DOSE REDUCTION: This exam was performed according to the departmental dose-optimization program which includes automated exposure control, adjustment of the mA and/or kV according to patient size and/or use of iterative reconstruction technique. CONTRAST:  75mL OMNIPAQUE  IOHEXOL  350 MG/ML SOLN COMPARISON:  CT abdomen pelvis dated 08/26/2013. FINDINGS: Lower chest: No acute abnormality. No intra-abdominal free air or free fluid. Hepatobiliary: The liver is unremarkable. No biliary dilatation. The gallbladder is unremarkable Pancreas: Unremarkable. No pancreatic ductal dilatation or surrounding inflammatory changes. Spleen: Normal in size without focal abnormality. Adrenals/Urinary Tract: The adrenal glands unremarkable there is no hydronephrosis on either side. Faint areas heterogeneous hypoenhancement of the upper pole  of the right kidney concerning for pyelonephritis. Correlation with urinalysis recommended. No abscess. The visualized ureters and urinary bladder unremarkable. Stomach/Bowel: Residual food bolus noted in the distal esophagus. There is no bowel obstruction or active inflammation. Mild sigmoid diverticulosis. Appendectomy. Vascular/Lymphatic: Moderate aortoiliac atherosclerotic disease. The IVC is unremarkable. No portal venous gas. There is no adenopathy. Reproductive: Hysterectomy.  No suspicious adnexal masses. Other: A 2.3 x 2.3 cm partially calcified lesion in the subcutaneous soft tissues of the anterior lower abdominal wall in the midline, likely sequela of post treatment changes or related to prior fat necrosis. Musculoskeletal: Osteopenia with  degenerative changes. No acute osseous pathology. L2-L3 laminectomy. IMPRESSION: 1. Findings concerning for right-sided pyelonephritis. Correlation with urinalysis recommended. No abscess. 2. Sigmoid diverticulosis.  No bowel obstruction. 3.  Aortic Atherosclerosis (ICD10-I70.0). Electronically Signed   By: Vanetta Chou M.D.   On: 03/21/2024 19:17   CT L-SPINE NO CHARGE Result Date: 03/21/2024 CLINICAL DATA:  Back pain. EXAM: CT LUMBAR SPINE WITHOUT CONTRAST TECHNIQUE: Multidetector CT imaging of the lumbar spine was performed without intravenous contrast administration. Multiplanar CT image reconstructions were also generated. RADIATION DOSE REDUCTION: This exam was performed according to the departmental dose-optimization program which includes automated exposure control, adjustment of the mA and/or kV according to patient size and/or use of iterative reconstruction technique. COMPARISON:  CT abdomen pelvis dated 08/26/2013. FINDINGS: Segmentation: 5 lumbar type vertebrae. Alignment: No acute subluxation.  Grade 1 L2-L3 retrolisthesis. Vertebrae: No acute fracture. The bones are osteopenic. L2-L3 laminectomy. Paraspinal and other soft tissues: Negative. Disc levels: Extensive multilevel degenerative changes with disc desiccation and vacuum phenomena. Multilevel disc space narrowing and endplate irregularity. IMPRESSION: 1. No acute/traumatic lumbar spine pathology. 2. Extensive multilevel degenerative changes. Electronically Signed   By: Vanetta Chou M.D.   On: 03/21/2024 18:58   Scheduled Meds:  amLODipine   10 mg Oral Daily   aspirin  EC  81 mg Oral QHS   atorvastatin   80 mg Oral Daily   clopidogrel   75 mg Oral Daily   dorzolamide -timolol   1 drop Both Eyes BID   enoxaparin  (LOVENOX ) injection  40 mg Subcutaneous Q24H   irbesartan   150 mg Oral Daily   latanoprost   1 drop Both Eyes QHS   metoprolol  tartrate  25 mg Oral BID   pantoprazole   40 mg Oral Daily   venlafaxine  XR  150 mg Oral Q  breakfast   Continuous Infusions:  cefTRIAXone  (ROCEPHIN )  IV 2 g (03/23/24 1457)     LOS: 2 days    Time spent: 50 mins    Darcel Dawley, MD [facility]   If 7PM-7AM, please contact night-coverage

## 2024-03-23 NOTE — Progress Notes (Signed)
 PROGRESS NOTE    Cassandra Brooks  FMW:[id number] DOB: April 26, 1940 DOA: 03/21/2024 PCP: Erick No A, NP  Subjective: Pt seen and examined. Met with pt, husband(Cassandra Brooks) and dtr)(Cassandra Brooks) at bedside. Overall pt is feeling better. No N/V. She has chronic back pain due to spinal stenosis and bulging discs. Her right flank pain has improved. Urine cx was ordered as add-on from ER UA sample due it appears that it was never sent. Remains on IV Rocephin  and tolerating it.  I explained [facility] Home program to pt, her husband Cassandra Brooks and dtr Cassandra Brooks. They are all interested in program. Pt lives at [address] [place], [place], [place] [address].  Her listed address is [employer] is for her dtr Cassandra Brooks. Dtr pays all of the patient's bills and all of pt's mail goes to pt's address.  Pt, husband and Dtr all agree with concept of Hospital at Home. She has already worked with PT/OT and does not have any PT/OT needs. Pt does not smoke. Husband knows how to use smartphone. Dtr is available for video calls. Pt does not have internet access at home but assured them that Hospital at Home does not require them to have internet access as we will bring our own wifi hotspot.  Written consent obtained.  Due to staffing issues, pt will be transferred to Hospital at [facility] tomorrow morning 03-24-2024.   Hospital Course: CC: syncope HPI: Cassandra Brooks is a 83 y.o. female with medical history significant of CAD, hypertension, hyperlipidemia who presented to the emergency department due to back pain.  Patient's family reported that she has been having progressively worsening pain over the last 2 weeks.  She also has had intermittent episodes of vomiting.  PMH CAD, HTN, HLD, chronic intermittent diarrhea who presented with recently worsening back pain along with nausea and vomiting.  Appetite has been okay however has vomiting soon after eating but denies any dysphagia or regurgitation symptoms.  She has had ongoing issues with intermittent  diarrhea to the point of becoming incontinent at times.  She has not undergone colonoscopy for workup for this.  She presents emergency department where she was found to be afebrile and hemodynamically stable.  Labs were obtained on presentation which showed CMP unrevealing, WBC 7.0, hemoglobin 14.4, lipase 27, urinalysis concerning for infection.  Patient underwent CT abdomen which showed right sided pyelonephritis.  Patient was given on ceftriaxone  and admitted further workup.   Significant Events: Admitted 03/21/2024 for acute pyelonephritis 03-23-2024 Pt consented for Hospital at [facility] program. Will be transferred to Hospital at [facility] program on 03-24-2024.  Admission Labs: Na 138, K 3.7, CO2 of 27, BUN 23, SCr 0.95, glu 161 WBC 7.0, HgB 14.4, plt 217 Lipase 27 UA cloudy, large LE, WBC >50, bacteria many, negative nitrite, 0-5 squamous cells.  Admission Imaging Studies: CT abd/pelvis Findings concerning for right-sided pyelonephritis. Correlation with urinalysis recommended. No abscess. 2. Sigmoid diverticulosis.  No bowel obstruction. 3.  Aortic Atherosclerosis CT L-spine No acute/traumatic lumbar spine pathology. 2. Extensive multilevel degenerative changes.  Significant Labs: Vit B12 of 2024 Folate >20  Significant Imaging Studies:   Antibiotic Therapy: Anti-infectives (From admission, onward)    Start     Dose/Rate Route Frequency Ordered Stop   03/22/24 1700  cefTRIAXone  (ROCEPHIN ) 2 g in sodium chloride  0.9 % 100 mL IVPB        2 g 200 mL/hr over 30 Minutes Intravenous Every 24 hours 03/21/24 2337     03/21/24 1745  cefTRIAXone  (ROCEPHIN ) 2 g in sodium  chloride 0.9 % 100 mL IVPB        2 g 200 mL/hr over 30 Minutes Intravenous  Once 03/21/24 1741 03/21/24 1908       Procedures:   Consultants:     Assessment and Plan: * Acute pyelonephritis 03/22/2024 - Complaining of right sided low back pain prior to admission and found to have right sided pyelonephritis on  CT.  Urinalysis consistent with infection - Continue Rocephin  - Follow-up urine culture  03/23/2024 unfortunately, urine cx was an add-on test and it looks like it was never sent. No blood cx were sent. Given her pyuria and pre-syncope symptoms with right flank pain, she will need a minimum of 5-7 days of IV abx since we have no culture data. Continue with IV Rocephin  2 grams daily. Today is day # 3 of therapy.   Hypomagnesemia 03/23/2024 repleted today with IV Mg. Check Mg in AM.   Diarrhea 03/22/2024 - Seemingly chronic and intermittent at home.  Low suspicion for acute infection at this time. -Encouraged her to continue discussions outpatient with primary care or GI to consider colonoscopy to further evaluate  03/23/2024 chronic in nature. Will need PCP follow up and outpatient GI referral.   Obesity (BMI 30.0-34.9) 03/23/2024 Body mass index is 31.08 kg/m.  CAD (coronary artery disease) 03/23/2024 - On aspirin , Plavix , Lipitor , Lopressor , ARB  Mixed hyperlipidemia 03/23/2024 Continue Lipitor  80 mg daily.  Diabetes mellitus due to underlying condition with unspecified complications (HCC) 03/22/2024 - No home meds.  Last A1c was controlled earlier this year. CBG checks for now  Essential hypertension 03/22/2024 Continue amlodipine  and ARB  03/23/2024 holding hydrochlorothiazide  for now given her recent N/V. Pt is eating and drinking well now.  Can restart her hydrochlorothiazide  after she is discharged.    Bulging lumbar disc 03/23/2024 chronic lumbar back pain. CT L-spine shows Extensive multilevel degenerative changes.  Prn tylenol . Pt does not use opiate narcotics.   Depression, major, recurrent, in complete remission 03/23/2024 Continue Effexor -XR 150 mg qAM  Glaucoma, both eyes 03/23/2024 Continue home eyedrops of Cosopt  and Xalatan    DVT prophylaxis: enoxaparin  (LOVENOX ) injection 40 mg Start: 03/22/24 1000 SCDs Start: 03/21/24 2322     Code Status: Full  Code Family Communication: discussed at bedside with dtr Cassandra Brooks and Husband Cassandra Brooks Disposition Plan: pt lives at [address] [place], [place], [place] [address].  Transfer to Hospital at [facility] on 03-24-2024. Reason for continuing need for hospitalization: remains on IV Rocephin  for acute pyelonephritis.  Objective: Vitals:   03/22/24 1539 03/22/24 2026 03/23/24 0628 03/23/24 0808  BP: (!) 141/53 (!) 144/115 (!) 145/60 (!) 142/56  Pulse: 76 (!) 109 80 84  Resp:  16 16 16   Temp: 98.4 F (36.9 C) 99.2 F (37.3 C) 98.1 F (36.7 C) 98.2 F (36.8 C)  TempSrc: Oral Oral  Oral  SpO2: 95% 98% 92% 95%  Weight:      Height:        Intake/Output Summary (Last 24 hours) at 03/23/2024 1325 Last data filed at 03/23/2024 0841 Gross per 24 hour  Intake 387 ml  Output --  Net 387 ml   Filed Weights   03/21/24 1835  Weight: 74.6 kg    Examination:  Physical Exam Vitals and nursing note reviewed.  Constitutional:      General: She is not in acute distress.    Appearance: She is obese. She is not toxic-appearing or diaphoretic.  HENT:     Head: Normocephalic and atraumatic.  Nose: Nose normal.  Eyes:     General: No scleral icterus. Cardiovascular:     Rate and Rhythm: Normal rate and regular rhythm.  Pulmonary:     Effort: Pulmonary effort is normal. No respiratory distress.     Breath sounds: Normal breath sounds.  Abdominal:     General: Bowel sounds are normal. There is no distension.     Palpations: Abdomen is soft.     Tenderness: There is no abdominal tenderness. There is no right CVA tenderness or left CVA tenderness.  Musculoskeletal:       Arms:  Skin:    Capillary Refill: Capillary refill takes less than 2 seconds.  Neurological:     General: No focal deficit present.     Mental Status: She is alert and oriented to person, place, and time.     Data Reviewed: I have personally reviewed following labs and imaging studies  CBC: Recent Labs  Lab 03/21/24 1511  03/22/24 0354 03/23/24 0417  WBC 7.0 10.8* 5.9  NEUTROABS 4.2  --  4.1  HGB 14.4 13.6 12.9  HCT 43.9 40.0 38.1  MCV 106.6* 103.9* 103.0*  PLT 217 192 171   Basic Metabolic Panel: Recent Labs  Lab 03/21/24 1511 03/22/24 0354 03/23/24 0417  NA 138 137 137  K 3.7 3.4* 3.5  CL 101 101 106  CO2 27 25 25   GLUCOSE 161* 202* 119*  BUN 23 23 18   CREATININE 0.95 1.12* 1.09*  CALCIUM  9.1 8.5* 8.2*  MG  --   --  1.5*   GFR: Estimated Creatinine Clearance: 36.1 mL/min (A) (by C-G formula based on SCr of 1.09 mg/dL (H)). Liver Function Tests: Recent Labs  Lab 03/21/24 1511  AST 24  ALT 19  ALKPHOS 78  BILITOT 1.1  PROT 7.9  ALBUMIN 3.3*   Recent Labs  Lab 03/21/24 1511  LIPASE 27   Coagulation Profile: Recent Labs  Lab 03/22/24 0652  INR 1.1   CBG: Recent Labs  Lab 03/23/24 1207  GLUCAP 129*   Anemia Panel: Recent Labs    03/23/24 0417  VITAMINB12 2,024*  FOLATE >20.0   Radiology Studies: CT ABDOMEN PELVIS W CONTRAST Result Date: 03/21/2024 CLINICAL DATA:  Abdominal pain. EXAM: CT ABDOMEN AND PELVIS WITH CONTRAST TECHNIQUE: Multidetector CT imaging of the abdomen and pelvis was performed using the standard protocol following bolus administration of intravenous contrast. RADIATION DOSE REDUCTION: This exam was performed according to the departmental dose-optimization program which includes automated exposure control, adjustment of the mA and/or kV according to patient size and/or use of iterative reconstruction technique. CONTRAST:  75mL OMNIPAQUE  IOHEXOL  350 MG/ML SOLN COMPARISON:  CT abdomen pelvis dated 08/26/2013. FINDINGS: Lower chest: No acute abnormality. No intra-abdominal free air or free fluid. Hepatobiliary: The liver is unremarkable. No biliary dilatation. The gallbladder is unremarkable Pancreas: Unremarkable. No pancreatic ductal dilatation or surrounding inflammatory changes. Spleen: Normal in size without focal abnormality. Adrenals/Urinary Tract:  The adrenal glands unremarkable there is no hydronephrosis on either side. Faint areas heterogeneous hypoenhancement of the upper pole of the right kidney concerning for pyelonephritis. Correlation with urinalysis recommended. No abscess. The visualized ureters and urinary bladder unremarkable. Stomach/Bowel: Residual food bolus noted in the distal esophagus. There is no bowel obstruction or active inflammation. Mild sigmoid diverticulosis. Appendectomy. Vascular/Lymphatic: Moderate aortoiliac atherosclerotic disease. The IVC is unremarkable. No portal venous gas. There is no adenopathy. Reproductive: Hysterectomy.  No suspicious adnexal masses. Other: A 2.3 x 2.3 cm partially calcified lesion in the subcutaneous  soft tissues of the anterior lower abdominal wall in the midline, likely sequela of post treatment changes or related to prior fat necrosis. Musculoskeletal: Osteopenia with degenerative changes. No acute osseous pathology. L2-L3 laminectomy. IMPRESSION: 1. Findings concerning for right-sided pyelonephritis. Correlation with urinalysis recommended. No abscess. 2. Sigmoid diverticulosis.  No bowel obstruction. 3.  Aortic Atherosclerosis (ICD10-I70.0). Electronically Signed   By: Vanetta Chou M.D.   On: 03/21/2024 19:17   CT L-SPINE NO CHARGE Result Date: 03/21/2024 CLINICAL DATA:  Back pain. EXAM: CT LUMBAR SPINE WITHOUT CONTRAST TECHNIQUE: Multidetector CT imaging of the lumbar spine was performed without intravenous contrast administration. Multiplanar CT image reconstructions were also generated. RADIATION DOSE REDUCTION: This exam was performed according to the departmental dose-optimization program which includes automated exposure control, adjustment of the mA and/or kV according to patient size and/or use of iterative reconstruction technique. COMPARISON:  CT abdomen pelvis dated 08/26/2013. FINDINGS: Segmentation: 5 lumbar type vertebrae. Alignment: No acute subluxation.  Grade 1 L2-L3  retrolisthesis. Vertebrae: No acute fracture. The bones are osteopenic. L2-L3 laminectomy. Paraspinal and other soft tissues: Negative. Disc levels: Extensive multilevel degenerative changes with disc desiccation and vacuum phenomena. Multilevel disc space narrowing and endplate irregularity. IMPRESSION: 1. No acute/traumatic lumbar spine pathology. 2. Extensive multilevel degenerative changes. Electronically Signed   By: Vanetta Chou M.D.   On: 03/21/2024 18:58    Scheduled Meds:  amLODipine   10 mg Oral Daily   aspirin  EC  81 mg Oral QHS   atorvastatin   80 mg Oral Daily   clopidogrel   75 mg Oral Daily   dorzolamide -timolol   1 drop Both Eyes BID   enoxaparin  (LOVENOX ) injection  40 mg Subcutaneous Q24H   irbesartan   150 mg Oral Daily   latanoprost   1 drop Both Eyes QHS   metoprolol  tartrate  25 mg Oral BID   pantoprazole   40 mg Oral Daily   venlafaxine  XR  150 mg Oral Q breakfast   Continuous Infusions:  cefTRIAXone  (ROCEPHIN )  IV 2 g (03/22/24 1857)     LOS: 2 days   Time spent: 65 minutes  Camellia Door, DO  [facility]  03/23/2024, 1:25 PM   Hospital at Home Admission Criteria Checklist:  Formal consent explained in detail and signed at the bedside: yes Patient meets inpatient admission criteria (see below for further details) yes Is pt Medicare FFS/[employer] Medicare-Medicaid, [employer], [employer] ( required for initial launch with plan to expand)? yes Lives within 25 mil/ 30 min from [facility] within [place](pt may stay with family member during admission who lives within 25 miles or 30 min from [facility] w/in [facility])? yes Hemodynamically stable with relatively low risk of clinical deterioration-not requiring ICU? yes Age >55? yes Does not require frequent touch-points or complex interventions/medications (ie Titrated Infusions (IV insulin, heparin  drips, vasoactive drips, use of infused or injectable controlled substances, patients on insulin)? yes Any  Behavioral Health comorbidities likely to increase risk for in-home care (ie Acute delirium or experiencing a marked altered mental status and cause is not a treatable condition in the home)? no Has the patient been on BIPAP during course of ED evaluation or hospitalization? no IF YES, Has the patient been off of BIPAP for >24 hours(If NO-THEN PATIENT DOES MEET INCLUSION CRITERIA)? not applicable On Room Air or Needs oxygen at home (<6L)? is not on home oxygen therapy. Active safety concerns (ie Unable to use bedside commode independently and lacks caregiver support for safety- needs SNF placement, unable to obtain IV access)? no  Has skin check been performed? yes  Has Physical Therapy screened the patient? yes  Common admission diagnoses including: CAP, COPD Exacerbation, Acute on chronic heart failure, Cellulitis, UTI , dehydration, acute resp failure with hypoxia (requiring <5L)   Social Screening:  - Has the family been directly contacted about Hospital at Home program with consent obtained (if yes- please document who was spoken to with name and phone number)? yes. Spoke with dtr Cassandra Brooks and husband Cassandra Brooks  -Was the family approached about the use of TOC pharmacy for medications at discharge? yes Denies significant ETOH intake? yes Does not smoke and understands may not smoke in the presence of oxygen? yes Patient states able to use iPad/phone for communication/has family who is able to use? yes Patient has agreed to be compliant with medication and treatment regimen of the program? yes Any active drug use in patient or primary caregiver including daily dosing of methadone? no Stable home environment ( access to appropriate heating in cold conditions and/or appropriate air conditioning in hot conditions and/or no running water /electricity)? yes  No aggressive pets at home? no Firearm present? yes  With ability or willingness to store them unloaded in a locked case for duration of  hospitalization? yes Ambulatory? yes  no difficulty Bed bugs present on home evaluation? no Family support system in place? yes Patient feels safe at home and does not endorse any violence? yes Any actively decompensated behavioral health issues including agitation/aggressive behavior? no  Patient requests food to be provided by hospital home program? no PT/OT eval completed and not requiring SNF, ALF, inpatient rehab? yes  To be admitted to the Hospital at [facility] program, a patient generally must meet the following: 1. Requirement for Inpatient Level of Care: The patient's condition must necessitate an inpatient level of care. This is typically indicated by one or more of the following, depending on their specific diagnosis:  Persistent tachycardia despite appropriate treatment (e.g., for Heart Failure, UTI). Persistent tachypnea (rapid breathing) or dyspnea (shortness of breath) that hasn't improved sufficiently with observation care (e.g., for Heart Failure, Pneumonia, Viral Illness, COVID). Hypoxemia (low oxygen levels), such as a new need for oxygen, an increased need from baseline, or specific oxygen saturation levels (e.g., SpO2 <90-94% depending on the condition) that persist despite observation (e.g., for Heart Failure, COPD, Pneumonia, Viral Illness, COVID). Need for Intravenous (IV) hydration due to an inability to maintain oral hydration, which persists despite observation care (e.g., for Cellulitis, UTI, Viral Illness, COVID). Specific to Heart Failure: Persistent pulmonary edema, indicated by a new oxygen need, lack of improvement with IV diuretics, and ongoing tachypnea/dyspnea. Specific to COPD: A decrease in known baseline resting oxygen saturation (SpO2) by 4% or more, or an increase in pre-existing supplemental oxygen requirements, which persists despite observation and requires continued close monitoring. Specific to Pneumonia: A Pneumonia Severity Index (PSI) class IV (moderate  risk). Specific to Cellulitis: Failure of outpatient antibiotic therapy (indicated by progression or no improvement after a minimum of 48 hours on an adequate regimen) or a clinical presentation (like acuity or rapidity of progression) that requires the intensity of monitoring found in an inpatient setting. Specific to UTI: Persistence or worsening of clinical findings like fever, pain, or dehydration despite observation care; presence of significant uropathy; suspected infection of an indwelling prosthetic device, stent, implant, or graft; or pregnancy with suspected pyelonephritis.  2. Appropriateness for Hospital at Home Setting: The patient's overall clinical picture, including the severity of their illness, their care needs, and their medical  history and comorbidities, must be suitable for management in the Hospital at Home environment. This essentially means that none of the exclusion criteria (listed below) are met.  Unified Exclusion Criteria for Hospital at Home Admission: A patient would not be eligible for Hospital at Home if any of the following are present: Hemodynamic Instability: Hypotension (low blood pressure) is present. Respiratory Instability or Needs Beyond Program Capability: There is a new need for invasive or noninvasive ventilatory assistance (like BiPAP or a ventilator). Oxygenation is not sufficient, generally indicated if an FiO2 (fraction of inspired oxygen) of 45% (which is about 6 Liters/minute via nasal cannula) or more is required to keep oxygen saturation (SpO2) at 90% or greater. Monitoring or Procedural Needs Beyond Program Capability: There is a need for invasive monitoring, such as a pulmonary artery catheter or an arterial line. There is a need for immediate-response telemetry monitoring (for dangerous arrhythmia detection and subsequent immediate intervention). The required medication regimen is beyond the capabilities of Hospital at Home (e.g., dosing  intervals are too frequent for home administration). There is a need for a procedure that cannot be performed by the Hospital at [facility] team (e.g., significant wound debridement or abscess drainage for cellulitis, or percutaneous nephrostomy for a complicated UTI). Significant Organ Dysfunction or Markers of Severe Illness: Mental status is not at baseline, or there is altered mental status suggestive of inadequate perfusion. Renal (kidney) function is unstable or showing an ongoing decline. There is evidence of inadequate perfusion, such as metabolic acidosis or myocardial ischemia. Uncompensated acidosis is present. Condition-Specific Severity or Complications Making Home Care Unsuitable: For Heart Failure: Known severe cardiac valvular disease (e.g., aortic stenosis, mitral regurgitation); or severe peripheral edema that impairs the ability to urinate or ambulate. For COPD: Known concurrent comorbidity or finding that indicates a higher-risk COPD exacerbation (e.g., pulmonary fibrosis, cavitation, pleural effusion, pneumothorax, rib fracture). For Pneumonia: Pneumonia Severity Index (PSI) class V (indicating high risk for inpatient mortality); known concurrent comorbidity or finding that indicates higher-risk pneumonia (e.g., pulmonary fibrosis, cavitation, large or loculated pleural effusion); or a concomitant serious infectious process like endocarditis or empyema. For Cellulitis: Orbital, periorbital, or necrotizing infection is suspected; or a concomitant serious infectious process like endocarditis, septic emboli, or septic joint space infection. For UTI: Urinary tract obstruction (e.g., kidney stone, bladder outlet obstruction); or a concomitant serious infectious process like endocarditis or septic emboli. For Viral Illness & COVID-19: A concomitant serious infectious process like endocarditis or empyema.  General Comorbidities or Status:  The patient is significantly immunosuppressed (this  applies to Pneumonia, Cellulitis, UTI, Viral Illness, and COVID-19). The patient meets inpatient admission criteria for a second diagnosis, or has care needs beyond the capabilities of Hospital at Home due to an active clinically significant comorbidity. (This is a general exclusion across all listed conditions)

## 2024-03-23 NOTE — Assessment & Plan Note (Signed)
 03/23/2024 chronic lumbar back pain. CT L-spine shows Extensive multilevel degenerative changes.  Prn tylenol . Pt does not use opiate narcotics.  03/25/2024 chronic. Prn tylenol 

## 2024-03-24 DIAGNOSIS — E876 Hypokalemia: Secondary | ICD-10-CM

## 2024-03-24 DIAGNOSIS — E66811 Obesity, class 1: Secondary | ICD-10-CM

## 2024-03-24 DIAGNOSIS — M51369 Other intervertebral disc degeneration, lumbar region without mention of lumbar back pain or lower extremity pain: Secondary | ICD-10-CM

## 2024-03-24 DIAGNOSIS — I10 Essential (primary) hypertension: Secondary | ICD-10-CM

## 2024-03-24 DIAGNOSIS — E782 Mixed hyperlipidemia: Secondary | ICD-10-CM

## 2024-03-24 DIAGNOSIS — E088 Diabetes mellitus due to underlying condition with unspecified complications: Secondary | ICD-10-CM

## 2024-03-24 LAB — COMPREHENSIVE METABOLIC PANEL WITH GFR
ALT: 15 U/L (ref 0–44)
AST: 21 U/L (ref 15–41)
Albumin: 2.3 g/dL — ABNORMAL LOW (ref 3.5–5.0)
Alkaline Phosphatase: 63 U/L (ref 38–126)
Anion gap: 11 (ref 5–15)
BUN: 16 mg/dL (ref 8–23)
CO2: 22 mmol/L (ref 22–32)
Calcium: 8.2 mg/dL — ABNORMAL LOW (ref 8.9–10.3)
Chloride: 108 mmol/L (ref 98–111)
Creatinine, Ser: 0.95 mg/dL (ref 0.44–1.00)
GFR, Estimated: 59 mL/min — ABNORMAL LOW (ref >60)
Glucose, Bld: 122 mg/dL — ABNORMAL HIGH (ref 70–99)
Potassium: 3.4 mmol/L — ABNORMAL LOW (ref 3.5–5.1)
Sodium: 141 mmol/L (ref 135–145)
Total Bilirubin: 0.6 mg/dL (ref 0.0–1.2)
Total Protein: 5.7 g/dL — ABNORMAL LOW (ref 6.5–8.1)

## 2024-03-24 LAB — CBC WITH DIFFERENTIAL/PLATELET
Abs Immature Granulocytes: 0.01 K/uL (ref 0.00–0.07)
Basophils Absolute: 0 K/uL (ref 0.0–0.1)
Basophils Relative: 1 %
Eosinophils Absolute: 0.2 K/uL (ref 0.0–0.5)
Eosinophils Relative: 4 %
HCT: 37.6 % (ref 36.0–46.0)
Hemoglobin: 12.5 g/dL (ref 12.0–15.0)
Immature Granulocytes: 0 %
Lymphocytes Relative: 30 %
Lymphs Abs: 1.3 K/uL (ref 0.7–4.0)
MCH: 35.1 pg — ABNORMAL HIGH (ref 26.0–34.0)
MCHC: 33.2 g/dL (ref 30.0–36.0)
MCV: 105.6 fL — ABNORMAL HIGH (ref 80.0–100.0)
Monocytes Absolute: 0.6 K/uL (ref 0.1–1.0)
Monocytes Relative: 13 %
Neutro Abs: 2.3 K/uL (ref 1.7–7.7)
Neutrophils Relative %: 52 %
Platelets: 162 K/uL (ref 150–400)
RBC: 3.56 MIL/uL — ABNORMAL LOW (ref 3.87–5.11)
RDW: 12.8 % (ref 11.5–15.5)
WBC: 4.3 K/uL (ref 4.0–10.5)
nRBC: 0 % (ref 0.0–0.2)

## 2024-03-24 LAB — GLUCOSE, CAPILLARY: Glucose-Capillary: 137 mg/dL — ABNORMAL HIGH (ref 70–99)

## 2024-03-24 LAB — MAGNESIUM: Magnesium: 2 mg/dL (ref 1.7–2.4)

## 2024-03-24 MED ORDER — NETARSUDIL-LATANOPROST 0.02-0.005 % OP SOLN
1.0000 [drp] | Freq: Every day | OPHTHALMIC | Status: DC
  Administered 2024-03-24: 18:00:00 1 [drp] via OPHTHALMIC

## 2024-03-24 MED ORDER — NABUMETONE 750 MG PO TABS
750.0000 mg | ORAL_TABLET | Freq: Two times a day (BID) | ORAL | Status: DC
  Administered 2024-03-24 – 2024-03-26 (×4): 750 mg via ORAL
  Filled 2024-03-24: qty 1

## 2024-03-24 MED ORDER — POTASSIUM CHLORIDE 20 MEQ PO PACK
40.0000 meq | PACK | Freq: Once | ORAL | Status: AC
  Administered 2024-03-24: 40 meq via ORAL
  Filled 2024-03-24: qty 2

## 2024-03-24 MED ORDER — LOPERAMIDE HCL 2 MG PO CAPS
2.0000 mg | ORAL_CAPSULE | ORAL | Status: DC | PRN
  Filled 2024-03-24 (×2): qty 1

## 2024-03-24 MED ORDER — DICLOFENAC SODIUM 1 % EX GEL
2.0000 g | Freq: Four times a day (QID) | CUTANEOUS | Status: DC
  Administered 2024-03-25 – 2024-03-26 (×3): 2 g via TOPICAL
  Filled 2024-03-24: qty 100

## 2024-03-24 MED ORDER — LOPERAMIDE HCL 2 MG PO CAPS
2.0000 mg | ORAL_CAPSULE | Freq: Once | ORAL | Status: DC
  Filled 2024-03-24: qty 1

## 2024-03-24 NOTE — Assessment & Plan Note (Signed)
 03/24/2024 will replete with 40 meq po kcl x 1 today. 03/25/24 - K normalized 3.8 --Monitor & replace K PRN

## 2024-03-24 NOTE — Progress Notes (Signed)
 PROGRESS NOTE    Cassandra Brooks  FMW:[id number] DOB: [birth year] DOA: 03/21/2024 PCP: Erick No A, NP  Subjective: Pt seen and examined. Afebrile overnight. K of 3.4 today. Pt still with persistent right flank pain. WBC 4.3. confirmed that urine cx never sent from ER UA sample. Day #4 of Rocephin  today.  Pt ready to go home with Hospital at [facility] program. Confirmed with pt that she lives at [address], [place], [place]. Confirmed that pt's dtr [name] will be present at home when pt arrives today.  [name]'s phone # is [phone]. She will meet [facility] team when pt arrives at pt's home.   Hospital Course: CC: syncope HPI: Cassandra Brooks is a 83 y.o. female with medical history significant of CAD, hypertension, hyperlipidemia who presented to the emergency department due to back pain.  Patient's family reported that she has been having progressively worsening pain over the last 2 weeks.  She also has had intermittent episodes of vomiting.  PMH CAD, HTN, HLD, chronic intermittent diarrhea who presented with recently worsening back pain along with nausea and vomiting.  Appetite has been okay however has vomiting soon after eating but denies any dysphagia or regurgitation symptoms.  She has had ongoing issues with intermittent diarrhea to the point of becoming incontinent at times.  She has not undergone colonoscopy for workup for this.  She presents emergency department where she was found to be afebrile and hemodynamically stable.  Labs were obtained on presentation which showed CMP unrevealing, WBC 7.0, hemoglobin 14.4, lipase 27, urinalysis concerning for infection.  Patient underwent CT abdomen which showed right sided pyelonephritis.  Patient was given on ceftriaxone  and admitted further workup.   Significant Events: Admitted 03/21/2024 for acute pyelonephritis 03-23-2024 Pt consented for Hospital at [facility] program. Will be transferred to Hospital at [facility] program on 03-24-2024. 03-24-2024 pt  will be transferred to Hospital at [facility] program today.  Admission Labs: Na 138, K 3.7, CO2 of 27, BUN 23, SCr 0.95, glu 161 WBC 7.0, HgB 14.4, plt 217 Lipase 27 UA cloudy, large LE, WBC >50, bacteria many, negative nitrite, 0-5 squamous cells.  Admission Imaging Studies: CT abd/pelvis Findings concerning for right-sided pyelonephritis. Correlation with urinalysis recommended. No abscess. 2. Sigmoid diverticulosis.  No bowel obstruction. 3.  Aortic Atherosclerosis CT L-spine No acute/traumatic lumbar spine pathology. 2. Extensive multilevel degenerative changes.  Significant Labs: Vit B12 of 2024 Folate >20  Significant Imaging Studies:   Antibiotic Therapy: Anti-infectives (From admission, onward)    Start     Dose/Rate Route Frequency Ordered Stop   03/22/24 1700  cefTRIAXone  (ROCEPHIN ) 2 g in sodium chloride  0.9 % 100 mL IVPB        2 g 200 mL/hr over 30 Minutes Intravenous Every 24 hours 03/21/24 2337     03/21/24 1745  cefTRIAXone  (ROCEPHIN ) 2 g in sodium chloride  0.9 % 100 mL IVPB        2 g 200 mL/hr over 30 Minutes Intravenous  Once 03/21/24 1741 03/21/24 1908       Procedures:   Consultants:     Assessment and Plan: * Acute pyelonephritis 03/22/2024 - Complaining of right sided low back pain prior to admission and found to have right sided pyelonephritis on CT.  Urinalysis consistent with infection - Continue Rocephin  - Follow-up urine culture  03/23/2024 unfortunately, urine cx was an add-on test and it looks like it was never sent. No blood cx were sent. Given her pyuria and pre-syncope symptoms with right flank pain, she  will need a minimum of 5-7 days of IV abx since we have no culture data. Continue with IV Rocephin  2 grams daily. Today is day # 3 of therapy.  03/24/2024 confirmed that Urine cx not sent from ER UA sample. Day #4 of IV rocephin . Still with right flank pain. I still think she will need a minimum of 5-7 days of IV Rocephin  given no culture  data. Pt will be transferred to Hospital at [facility] program today.    Hypomagnesemia 03/23/2024 repleted today with IV Mg. Check Mg in AM.  03/24/2024 resolved. Mg 2.0 today    Hypokalemia 03/24/2024 will replete with 40 meq po kcl x 1 today.   Diarrhea 03/22/2024 - Seemingly chronic and intermittent at home.  Low suspicion for acute infection at this time. -Encouraged her to continue discussions outpatient with primary care or GI to consider colonoscopy to further evaluate  03/23/2024 chronic in nature. Will need PCP follow up and outpatient GI referral.  03/24/2024 no complaints of diarrhea. Watch for worsening bowel function with IV abx.    Obesity (BMI 30.0-34.9) 03/23/2024 Body mass index is 31.08 kg/m.  CAD (coronary artery disease) 03/23/2024 - On aspirin , Plavix , Lipitor , Lopressor , ARB  03/24/2024 continue ASA, plavix , lipitor , lopressor , avapro .   Mixed hyperlipidemia 03/23/2024 Continue Lipitor  80 mg daily.  03/24/2024 continue lipitor  80 mg daily.   Diabetes mellitus due to underlying condition with unspecified complications (HCC) 03/22/2024 - No home meds.  Last A1c was controlled earlier this year. CBG checks for now  03/24/2024 stable.   Essential hypertension 03/22/2024 Continue amlodipine  and ARB  03/23/2024 holding hydrochlorothiazide  for now given her recent N/V. Pt is eating and drinking well now.  Can restart her hydrochlorothiazide  after she is discharged.  03/24/2024 BP stable but slightly on the rise. Can consider restarting hydrochlorothiazide  soon. Continue norvasc  and ARB for now.    Bulging lumbar disc 03/23/2024 chronic lumbar back pain. CT L-spine shows Extensive multilevel degenerative changes.  Prn tylenol . Pt does not use opiate narcotics.  03/24/2024 chronic. Prn tylenol     Depression, major, recurrent, in complete remission 03/23/2024 Continue Effexor -XR 150 mg qAM  03/24/2024 continue Effexor -XR   Glaucoma, both  eyes 03/23/2024 Continue home eyedrops of Cosopt  and Xalatan   03/24/2024 continue glaucoma drops    DVT prophylaxis: enoxaparin  (LOVENOX ) injection 40 mg Start: 03/22/24 1000 SCDs Start: 03/21/24 2322     Code Status: Full Code Family Communication: discussed with pt's dtr [name](via phone) Disposition Plan: home Reason for continuing need for hospitalization: remains on IV Rocephin   Objective: Vitals:   03/23/24 1654 03/23/24 2000 03/24/24 0422 03/24/24 0743  BP: (!) 163/82 (!) 119/52 (!) 143/60 (!) 136/52  Pulse: 89  73 77  Resp: 16 17 16    Temp: 98.2 F (36.8 C) 98.1 F (36.7 C) 98.1 F (36.7 C) 98.2 F (36.8 C)  TempSrc:  Oral Oral   SpO2: 97% 95% 94% 93%  Weight:      Height:        Intake/Output Summary (Last 24 hours) at 03/24/2024 0819 Last data filed at 03/23/2024 2000 Gross per 24 hour  Intake 576.4 ml  Output --  Net 576.4 ml   Filed Weights   03/21/24 1835  Weight: 74.6 kg    Examination:  Physical Exam Vitals and nursing note reviewed.  Constitutional:      General: She is not in acute distress.    Appearance: She is obese. She is not toxic-appearing or diaphoretic.  HENT:  Head: Normocephalic and atraumatic.     Nose: Nose normal.  Eyes:     General: No scleral icterus. Cardiovascular:     Rate and Rhythm: Normal rate and regular rhythm.  Pulmonary:     Effort: Pulmonary effort is normal. No respiratory distress.     Breath sounds: Normal breath sounds. No wheezing.  Abdominal:     General: Bowel sounds are normal. There is no distension.     Palpations: Abdomen is soft.     Comments: Mild right CVA tenderness  Musculoskeletal:     Right lower leg: No edema.     Left lower leg: No edema.  Skin:    General: Skin is warm and dry.     Capillary Refill: Capillary refill takes less than 2 seconds.     Comments: Left forearm PIV  Neurological:     General: No focal deficit present.     Mental Status: She is alert and oriented to  person, place, and time.     Comments: Pt able to scoot herself up in bed while lying supine     Data Reviewed: I have personally reviewed following labs and imaging studies  CBC: Recent Labs  Lab 03/21/24 1511 03/22/24 0354 03/23/24 0417 03/24/24 0411  WBC 7.0 10.8* 5.9 4.3  NEUTROABS 4.2  --  4.1 2.3  HGB 14.4 13.6 12.9 12.5  HCT 43.9 40.0 38.1 37.6  MCV 106.6* 103.9* 103.0* 105.6*  PLT 217 192 171 162   Basic Metabolic Panel: Recent Labs  Lab 03/21/24 1511 03/22/24 0354 03/23/24 0417 03/24/24 0411  NA 138 137 137 141  K 3.7 3.4* 3.5 3.4*  CL 101 101 106 108  CO2 27 25 25 22   GLUCOSE 161* 202* 119* 122*  BUN 23 23 18 16   CREATININE 0.95 1.12* 1.09* 0.95  CALCIUM  9.1 8.5* 8.2* 8.2*  MG  --   --  1.5* 2.0   GFR: Estimated Creatinine Clearance: 41.4 mL/min (by C-G formula based on SCr of 0.95 mg/dL). Liver Function Tests: Recent Labs  Lab 03/21/24 1511 03/24/24 0411  AST 24 21  ALT 19 15  ALKPHOS 78 63  BILITOT 1.1 0.6  PROT 7.9 5.7*  ALBUMIN 3.3* 2.3*   Recent Labs  Lab 03/21/24 1511  LIPASE 27   Coagulation Profile: Recent Labs  Lab 03/22/24 0652  INR 1.1   CBG: Recent Labs  Lab 03/23/24 1207 03/23/24 1652  GLUCAP 129* 138*   Anemia Panel: Recent Labs    03/23/24 0417  VITAMINB12 2,024*  FOLATE >20.0   Scheduled Meds:  amLODipine   10 mg Oral Daily   aspirin  EC  81 mg Oral QHS   atorvastatin   80 mg Oral Daily   clopidogrel   75 mg Oral Daily   dorzolamide -timolol   1 drop Both Eyes BID   enoxaparin  (LOVENOX ) injection  40 mg Subcutaneous Q24H   feeding supplement  237 mL Oral BID BM   irbesartan   150 mg Oral Daily   latanoprost   1 drop Both Eyes QHS   metoprolol  tartrate  25 mg Oral BID   pantoprazole   40 mg Oral Daily   venlafaxine  XR  150 mg Oral Q breakfast   Continuous Infusions:  cefTRIAXone  (ROCEPHIN )  IV 2 g (03/23/24 1457)     LOS: 3 days   Time spent: 40 minutes  Camellia Door, DO  [facility]  03/24/2024, 8:19 AM

## 2024-03-24 NOTE — Progress Notes (Signed)
 Pt admitted to [facility] program. Pt picked up by myself in wheelchair and transferred to wheelchair van. Pt transported by myself to Pt's home which is 30 miles and 35 minutes away from [facility]. Upon arrival at Pt's house, I unloaded her from wheelchair van via wheelchair and transferred her into garage. Pt was able to walk up 2 steps from garage into home with minimal assistance. Pt's husband and daughter are present.   Home safety inspection completed. Photos uploaded to teams. Pt's home is spacious with no clutter and clear well lit walkways. Pt spends majority of her time in living room. Pt's shower is a walk in and has a grab bar and chair. No pets in the home. I placed main router with Pt's active kit in dining room and a second router in Pt's bedroom to avoid any disconnections throughout the night due to size of home and no optimal middle area to put main router.   Med rec completed with virtual RN. Pt will take home supply of Relafen  PO and of Netarsudil -Latanoprost  eye drops. Photos of both added to media tab via rover. Medications administered as noted. Pt wished to take all bedtime meds while I was present because she is ready to go to bed soon. Home meds sequestered via tamper proof bags and placed inhat box in Pt's closet.   2 person skin assessment completed. Bruising to left foot near toes noted from Pt having trouble putting shoes on prior to admission. Photos added to media tab.  Vital signs and assessment obtained as noted. Lung sounds clear and equal bilaterally. Heart tones s1, s2. Bowel sounds active and abdomen soft and non-tender in all quadrants. Non-pitting dependant edema noted to both lower extremities. Pt states that is normal for her.   IV care completed.   Virtual call with RN complete and I showed Pt, her husband and daughter how to make and receive calls on tablet. I explained to remove wearable whenever bathing. I also explained welcome binder and how to call  the hub via telephone as well. Pt and family members asked to please call RN before administering any medications. Pt encouraged to call with any problems or questions.

## 2024-03-24 NOTE — Progress Notes (Signed)
 At admit meeting with patient, spouse, and daughter, and Medic. Patient c/o low back pain wants her pain medication before bedtime around 0830. We discussed the use of heating pad and diclofenac  gel order placed. Provider and pharmacy reviewed eyedrops and she is approved for Rocklatan  eye drops her home supply use. Educated on how the program works daily visits and no recording of personal information. Medic educated on current health equipment use and monitoring. Patients will place call before going to bed they prefer not to have a call this evening as there are no outstanding medications for this evening, unless there is an issue they want to sleep. Will pass on to night shift RN.

## 2024-03-25 LAB — GLUCOSE, CAPILLARY
Glucose-Capillary: 169 mg/dL — ABNORMAL HIGH (ref 70–99)
Glucose-Capillary: 200 mg/dL — ABNORMAL HIGH (ref 70–99)
Glucose-Capillary: 119 mg/dL — ABNORMAL HIGH (ref 70–99)

## 2024-03-25 LAB — BASIC METABOLIC PANEL WITH GFR
Anion gap: 11 (ref 5–15)
BUN: 20 mg/dL (ref 8–23)
CO2: 22 mmol/L (ref 22–32)
Calcium: 8.4 mg/dL — ABNORMAL LOW (ref 8.9–10.3)
Chloride: 108 mmol/L (ref 98–111)
Creatinine, Ser: 0.82 mg/dL (ref 0.44–1.00)
GFR, Estimated: >60 mL/min (ref >60)
Glucose, Bld: 112 mg/dL — ABNORMAL HIGH (ref 70–99)
Potassium: 3.8 mmol/L (ref 3.5–5.1)
Sodium: 141 mmol/L (ref 135–145)

## 2024-03-25 MED ORDER — HYDROCHLOROTHIAZIDE 12.5 MG PO TABS
12.5000 mg | ORAL_TABLET | Freq: Every day | ORAL | Status: DC
  Administered 2024-03-26: 11:00:00 12.5 mg via ORAL
  Filled 2024-03-25 (×3): qty 1

## 2024-03-25 NOTE — Progress Notes (Signed)
 Hospital at Home Daily Progress Note   Patient: Cassandra Brooks  MRN: [id number]  DOB: [birth year]  DOA: 03/21/2024  DOS: the patient was seen and examined on @TODAY @    Patient identified themself as Cassandra Brooks  DOB Apr 10, 1941  Medic Lauraine Faes present in the home during encounter and performed the assessment and physical exam.  Patient was seen today via video conference; my physical location [place], [place]   Brief hospital course:  CC: syncope HPI: Cassandra Brooks is a 83 y.o. female with medical history significant of CAD, hypertension, hyperlipidemia who presented to the emergency department due to back pain.  Patient's family reported that she has been having progressively worsening pain over the last 2 weeks.  She also has had intermittent episodes of vomiting.  PMH CAD, HTN, HLD, chronic intermittent diarrhea who presented with recently worsening back pain along with nausea and vomiting.  Appetite has been okay however has vomiting soon after eating but denies any dysphagia or regurgitation symptoms.  She has had ongoing issues with intermittent diarrhea to the point of becoming incontinent at times.  She has not undergone colonoscopy for workup for this.  She presents emergency department where she was found to be afebrile and hemodynamically stable.  Labs were obtained on presentation which showed CMP unrevealing, WBC 7.0, hemoglobin 14.4, lipase 27, urinalysis concerning for infection.  Patient underwent CT abdomen which showed right sided pyelonephritis.  Patient was given on ceftriaxone  and admitted further workup.   Significant Events: Admitted 03/21/2024 for acute pyelonephritis 03-23-2024 Pt consented for Hospital at [facility] program. Will be transferred to Hospital at [facility] program on 03-24-2024. 03-24-2024 transferred to Hospital at Home program   Admission Labs: Na 138, K 3.7, CO2 of 27, BUN 23, SCr 0.95, glu 161 WBC 7.0, HgB 14.4, plt 217 Lipase 27 UA cloudy,  large LE, WBC >50, bacteria many, negative nitrite, 0-5 squamous cells.  Admission Imaging Studies: CT abd/pelvis Findings concerning for right-sided pyelonephritis. Correlation with urinalysis recommended. No abscess. 2. Sigmoid diverticulosis.  No bowel obstruction. 3.  Aortic Atherosclerosis CT L-spine No acute/traumatic lumbar spine pathology. 2. Extensive multilevel degenerative changes.  Significant Labs: Vit B12 of 2024 Folate >20  Significant Imaging Studies: K 3.4 on 12/14 (replaced) >> pending  Procedures: None  Consultants: None    Assessment and Plan:  * Acute pyelonephritis 03/22/2024 - Complaining of right sided low back pain prior to admission and found to have right sided pyelonephritis on CT.  Urinalysis consistent with infection - Continue Rocephin  - Follow-up urine culture  03/23/2024 unfortunately, urine cx was an add-on test and it looks like it was never sent. No blood cx were sent. Given her pyuria and pre-syncope symptoms with right flank pain, she will need a minimum of 5-7 days of IV abx since we have no culture data. Continue with IV Rocephin  2 grams daily. Today is day # 3 of therapy.  03/24/2024 confirmed that Urine cx not sent from ER UA sample. c Still with right flank pain. I still think she will need a minimum of 5-7 days of IV Rocephin  given no culture data. Pt will be transferred to Hospital at [facility] program today.  12/15 -- Day #5 IV Rocephin .  No fever/chills.  N/V improved.  Still with intermittent flank pain, but improving.  Will continue IV antibiotic for today and re-assess tomorrow if appropriate to transition to PO antibiotics.    Hypomagnesemia 03/23/2024 repleted today with IV Mg. Check Mg in AM. 03/24/2024 resolved. Mg 2.0  today 12/15 - no repeat Mg level needed    Hypokalemia 03/24/2024 will replete with 40 meq po kcl x 1 today. 03/25/24 - K normalized 3.8 --Monitor & replace K PRN   Diarrhea 03/22/2024 - Chronic and  intermittent at home.  Low suspicion for acute infection at this time. -Encouraged her to continue discussions outpatient with primary care or GI to consider colonoscopy to further evaluate  03/23/2024 chronic in nature. Will need PCP follow up and outpatient GI referral.  03/24/2024 no complaints of diarrhea. Watch for worsening bowel function with IV abx.  12/15 -- diarrhea overnight, requested Imodium , ordered PRN.      Obesity (BMI 30.0-34.9) 03/25/2024 Body mass index is 31.08 kg/m.  CAD (coronary artery disease) 03/25/2024 continue ASA, plavix , lipitor , lopressor , avapro .   Mixed hyperlipidemia 03/25/2024 continue lipitor  80 mg daily.   Diabetes mellitus due to underlying condition with unspecified complications (HCC) 03/22/2024 - No home meds.  Last A1c was controlled earlier this year. CBG checks for now  03/25/2024 stable. Non-fasting glucose 112 on labs.   Essential hypertension 03/22/2024 Continue amlodipine  and ARB  03/23/2024 holding hydrochlorothiazide  for now given her recent N/V. Pt is eating and drinking well now.  Can restart her hydrochlorothiazide  after she is discharged.  03/24/2024 BP stable but slightly on the rise. Can consider restarting hydrochlorothiazide  soon. Continue norvasc  and ARB for now.  03/25/24 - BP elevated today 177/77, AM meds had not been given yet.  Will re-assess BP after meds in effect.  Will plan to resume hydrochlorothiazide  if BP still elevated    Bulging lumbar disc 03/23/2024 chronic lumbar back pain. CT L-spine shows Extensive multilevel degenerative changes.  Prn tylenol . Pt does not use opiate narcotics.  03/25/2024 chronic. Prn tylenol     Depression, major, recurrent, in complete remission 03/25/2024 continue Effexor -XR   Glaucoma, both eyes 03/25/2024 Continue home eyedrops of Cosopt  and Xalatan        Patient Active Problem List   Diagnosis Date Noted   Acute pyelonephritis 03/21/2024    Priority: High    Hypomagnesemia 03/23/2024    Priority: Medium    Hypokalemia 03/24/2024    Priority: Low   Diarrhea 03/22/2024    Priority: Low   Obesity (BMI 30.0-34.9) 09/29/2022    Priority: Low   CAD (coronary artery disease) 08/17/2022    Priority: Low   Essential hypertension 12/19/2019    Priority: Low   Diabetes mellitus due to underlying condition with unspecified complications (HCC) 12/19/2019    Priority: Low   Glaucoma, both eyes     Priority: Low   Depression, major, recurrent, in complete remission     Priority: Low   Bulging lumbar disc     Priority: Low   Mixed hyperlipidemia 04/18/2017    Priority: Low   Spinal stenosis of lumbar region with neurogenic claudication 04/26/2021   Primary osteoarthritis of left foot 10/21/2020   Plantar fasciitis 10/21/2020   Pre-diabetes    GERD (gastroesophageal reflux disease)    Fibromyalgia    Arthritis    AAA (abdominal aortic aneurysm)    Primary osteoarthritis of left knee 07/18/2019   Solitary pulmonary nodule 03/18/2015   Nuclear cataract of both eyes 01/01/2014      Significant Events: Admitted 03/21/2024    Subjective / Interval 24 hour History:  Patient seen virtually during a.m. medic visit today.  She reports feeling like a new person still having intermittent pain in her mid back right flank.  Reports appetite being good.  Denies  nausea or vomiting.  No fevers or chills.  She had increased pain a short while before our encounter, 4-5 out of 10, also reported pain feeling grippy earlier this morning when she got up.  Husband present in the room during the encounter reports she complained of nausea a couple of times since coming home yesterday.  Patient did have some recurrent loose stools overnight improved with Imodium  that was given.  Husband reports that patient does have some memory issues since her prior stroke, sometimes does not remember recent symptoms she has previously mentioned to him.    Antibiotic  Therapy: Anti-infectives (From admission, onward)    Start     Dose/Rate Route Frequency Ordered Stop   03/22/24 1700  cefTRIAXone  (ROCEPHIN ) 2 g in sodium chloride  0.9 % 100 mL IVPB        2 g 200 mL/hr over 30 Minutes Intravenous Every 24 hours 03/21/24 2337     03/21/24 1745  cefTRIAXone  (ROCEPHIN ) 2 g in sodium chloride  0.9 % 100 mL IVPB        2 g 200 mL/hr over 30 Minutes Intravenous  Once 03/21/24 1741 03/21/24 1908       Procedures: None  Consultants: None          Physical Exam:    03/25/2024   12:00 PM 03/25/2024    5:47 AM 03/24/2024    4:00 PM  Vitals with BMI  Weight   164 lbs 8 oz  BMI   31.1  Systolic 177  164  Diastolic 77  92  Pulse 77 75 91     General exam: awake, alert, no acute distress HEENT: Voice clear,, hearing grossly normal  Respiratory system: CTAB, no wheezes, rales or rhonchi, normal respiratory effort. Cardiovascular system: normal S1/S2, RRR, intact symmetric radial pulses, no peripheral edema Gastrointestinal system: soft, NT, ND, +bowel sounds. Central nervous system: no gross focal neurologic deficits, normal speech Extremities: Moves all, no edema Psychiatry: normal mood, congruent affect, judgement and insight appear normal    Data Reviewed:  Notable labs today include Potassium normalized to 3.8 after replacement yesterday.  Nonfasting glucose 112  Family Communication: Husband present during virtual visit this morning  Disposition: Status is: Inpatient Remains inpatient appropriate because: Remains on IV antibiotics for pyelonephritis pending further clinical improvement and stability   Planned Discharge Destination: Home    Time spent: 45 minutes  Author: Burnard DELENA Cunning, DO [facility]  11/30/2023 7:00 AM  For on call review [email or web address].

## 2024-03-25 NOTE — Progress Notes (Addendum)
 Pt seen for routine [facility] morning visit. Pt appears well but states she did have a little bit of trouble sleeping due to diarrhea. Pt does not want any immodium at this time and states the diarrhea has stopped. Pt also c/o of chronic mid-back pain. Pt states pain is not as bad when sitting but when moving or when she first wakes up it is worse and she feels stiff. Pt states she used the diclofenac  gel around 8am and states it helped some. Pt would like tylenol . Pt forgot to call the virtual RN when applying diclofenac  gel and I reminded her and her husband to call so that we can document and verify any medications and they verbalize understanding same.   Vital signs and assessment obtained as noted. Pt's lungs are clear and equal bilaterally. Heart tones are s1, s2 and pulses are strong and regular. Pt's abdomen is soft in all quadrants. Pt's LLQ is slightly tender. Bowel sounds are faint in LLQ but active in all other quadrants.   Medications administered as noted. Pt's husband asked if in the morning, can he just call the virtual RN to do medications because she typically takes them at 8am. I told him he can most certainly do that and that I would inform the team of same. I told him we will still have to do the Rocephin  IV medication and he verbalizes understanding same.  IV care completed including new curos cap.  Pt had virtual visit with RN and Dr. Fausto and care plan was discussed.  Labs drawn as noted.  Main router and charging station/ tablet moved into living room. Extra router is still in Pt's bedroom. IV pump and pole brought out as well.  Pt encouraged to call back with any problems or questions. Pt goes to bed by 8:30 at the latest and would prefer a second visit before then.

## 2024-03-25 NOTE — Plan of Care (Signed)

## 2024-03-25 NOTE — Progress Notes (Signed)
 2003-Unable to reach patient via phone. No message left.   2005-Outreach to Daughter, RN introduction, Patient identifiers completed. Per daughter patient and spouse may have already gone to sleep. Suggest calling tablet, if there is no answer, daughter suggested calling spouse's phone.    2007--Video call- RN introduction, Patient identifiers completed. Spouse reported patient has already gone to bed and is sleeping. Spouse reported that the patient had some diarrhea and requested Imodium  for the patient. Spouse reported if needed he will access the I need help button for Imodium  medication. Patient assessment completed with spouse who is listed as an EC and for release of PHI.  Virtual RN will continue to monitor patient overnight; both encouraged to call as needed. [facility] contact information reviewed.   2012: Night APP notified of patient bowel condition and family request for medication intervention.

## 2024-03-25 NOTE — Progress Notes (Signed)
 Pt initiated virtual call via ipad. Pt adm. Her eye gtts, states she will wait for paramedic for other am meds. Pt is alert and oriented,slept well. Instructed to call for any needs. Updated her on timing of am paramedic visit.

## 2024-03-25 NOTE — Progress Notes (Signed)
 I spoke with patient on the phone to update with new ETA of paramedic.Pt still wanted to wait for paramedic for meds

## 2024-03-25 NOTE — Plan of Care (Signed)
°  Problem: Clinical Measurements: Goal: Will remain free from infection Outcome: Progressing   Problem: Clinical Measurements: Goal: Diagnostic test results will improve Outcome: Progressing   Problem: Elimination: Goal: Will not experience complications related to bowel motility Outcome: Progressing Goal: Will not experience complications related to urinary retention Outcome: Progressing   Problem: Pain Managment: Goal: General experience of comfort will improve and/or be controlled Outcome: Progressing   Problem: Safety: Goal: Ability to remain free from injury will improve Outcome: Progressing   Problem: Skin Integrity: Goal: Risk for impaired skin integrity will decrease Outcome: Progressing

## 2024-03-25 NOTE — Progress Notes (Signed)
 Completed virtual rounds with MD,paramedic at patient bedside. POC reviewed and discussed ,patient voices understanding and agreement. Pt reminded to call RN for any needs, RN and MD available at all times. Pt voices understanding. Pt aware of next planned visit and next call from RN.

## 2024-03-26 LAB — BASIC METABOLIC PANEL WITH GFR
Anion gap: 11 (ref 5–15)
BUN: 17 mg/dL (ref 8–23)
CO2: 23 mmol/L (ref 22–32)
Calcium: 8.8 mg/dL — ABNORMAL LOW (ref 8.9–10.3)
Chloride: 107 mmol/L (ref 98–111)
Creatinine, Ser: 0.84 mg/dL (ref 0.44–1.00)
GFR, Estimated: >60 mL/min (ref >60)
Glucose, Bld: 140 mg/dL — ABNORMAL HIGH (ref 70–99)
Potassium: 4.1 mmol/L (ref 3.5–5.1)
Sodium: 141 mmol/L (ref 135–145)

## 2024-03-26 MED ORDER — RISAQUAD PO CAPS
2.0000 | ORAL_CAPSULE | Freq: Every day | ORAL | Status: DC
  Filled 2024-03-26: qty 2

## 2024-03-26 MED ORDER — ONDANSETRON 4 MG PO TBDP: 4.0000 mg | ORAL_TABLET | ORAL | 0 refills | Status: AC | PRN

## 2024-03-26 MED ORDER — DICLOFENAC SODIUM 1 % EX GEL: 2.0000 g | Freq: Four times a day (QID) | CUTANEOUS | Status: DC

## 2024-03-26 MED ORDER — LOPERAMIDE HCL 2 MG PO CAPS: 2.0000 mg | ORAL_CAPSULE | ORAL | Status: DC | PRN

## 2024-03-26 MED ORDER — ENSURE PLUS HIGH PROTEIN PO LIQD: 237.0000 mL | Freq: Two times a day (BID) | ORAL | Status: DC

## 2024-03-26 MED ORDER — RISAQUAD PO CAPS: 2.0000 | ORAL_CAPSULE | Freq: Every day | ORAL | 0 refills | Status: DC

## 2024-03-26 MED ORDER — AMOXICILLIN-POT CLAVULANATE 875-125 MG PO TABS: 1.0000 | ORAL_TABLET | Freq: Two times a day (BID) | ORAL | 0 refills | Status: AC

## 2024-03-26 NOTE — Progress Notes (Signed)
 Virtual visit with Cassandra Brooks. He is administering morning medication early because he has an appointment to attend. The daughter will be at the home this morning.

## 2024-03-26 NOTE — Discharge Summary (Signed)
 "  [facility] Physician Discharge Summary   Patient: Cassandra Brooks MRN: [id number] DOB: 1940-08-04  Admit date:     03/21/2024  Discharge date: 03/26/2024  Discharge Physician: Cassandra Brooks   PCP: Cassandra Greig DELENA, NP   Patient identified themself as Cassandra Brooks  DOB 1940-12-21  Medic Barnie Bud present in the home during encounter and performed the physical exam and assessment.  Patient was seen today via video chat; my physical location [employer] [place]   Recommendations at discharge:    Follow up with PCP in 1-2 weeks Repeat CBC, CMP, Mg at follow up  Discharge Diagnoses: Principal Problem:   Acute pyelonephritis Active Problems:   Hypomagnesemia   Glaucoma, both eyes   Depression, major, recurrent, in complete remission   Bulging lumbar disc   Essential hypertension   Diabetes mellitus due to underlying condition with unspecified complications (HCC)   Mixed hyperlipidemia   CAD (coronary artery disease)   Obesity (BMI 30.0-34.9)   Diarrhea   Hypokalemia  Resolved Problems:   * No resolved hospital problems. *  Hospital Course: CC: syncope HPI: Cassandra Brooks is a 83 y.o. female with medical history significant of CAD, hypertension, hyperlipidemia who presented to the emergency department due to back pain.  Patient's family reported that she has been having progressively worsening pain over the last 2 weeks.  She also has had intermittent episodes of vomiting.  PMH CAD, HTN, HLD, chronic intermittent diarrhea who presented with recently worsening back pain along with nausea and vomiting.  Appetite has been okay however has vomiting soon after eating but denies any dysphagia or regurgitation symptoms.  She has had ongoing issues with intermittent diarrhea to the point of becoming incontinent at times.  She has not undergone colonoscopy for workup for this.  She presents emergency department where she was found to be afebrile and hemodynamically stable.   Labs were obtained on presentation which showed CMP unrevealing, WBC 7.0, hemoglobin 14.4, lipase 27, urinalysis concerning for infection.  Patient underwent CT abdomen which showed right sided pyelonephritis.  Patient was given on ceftriaxone  and admitted further workup.   Significant Events: Admitted 03/21/2024 for acute pyelonephritis 03-23-2024 Pt consented for Hospital at [facility] program. Will be transferred to Hospital at [facility] program on 03-24-2024. 03-24-2024 transferred to Hospital at Home program   Admission Labs: Na 138, K 3.7, CO2 of 27, BUN 23, SCr 0.95, glu 161 WBC 7.0, HgB 14.4, plt 217 Lipase 27 UA cloudy, large LE, WBC >50, bacteria many, negative nitrite, 0-5 squamous cells.  Admission Imaging Studies: CT abd/pelvis Findings concerning for right-sided pyelonephritis. Correlation with urinalysis recommended. No abscess. 2. Sigmoid diverticulosis.  No bowel obstruction. 3.  Aortic Atherosclerosis CT L-spine No acute/traumatic lumbar spine pathology. 2. Extensive multilevel degenerative changes.  Significant Labs: Vit B12 of 2024 Folate >20  Significant Imaging Studies: K 3.4 on 12/14 (replaced) >> pending  Procedures: None  Consultants: None   Discharge with   Assessment and Plan: * Acute pyelonephritis 03/22/2024 - Complaining of right sided low back pain prior to admission and found to have right sided pyelonephritis on CT.  Urinalysis consistent with infection - Continue Rocephin  - Follow-up urine culture  03/23/2024 unfortunately, urine cx was an add-on test and it looks like it was never sent. No blood cx were sent. Given her pyuria and pre-syncope symptoms with right flank pain, she will need a minimum of 5-7 days of IV abx since we have no culture data. Continue with IV Rocephin  2  grams daily. Today is day # 3 of therapy.  03/24/2024 confirmed that Urine cx not sent from ER UA sample. c Still with right flank pain. I still think she will need a minimum of  5-7 days of IV Rocephin  given no culture data. Pt will be transferred to Hospital at [facility] program today.  12/15 -- Day #5 IV Rocephin .  No fever/chills.  N/V improved.  Still with intermittent flank pain, but improving.  Will continue IV antibiotic for today and re-assess tomorrow if appropriate to transition to PO antibiotics.    Hypomagnesemia 03/23/2024 repleted today with IV Mg. Check Mg in AM. 03/24/2024 resolved. Mg 2.0 today 12/15 - no repeat Mg level needed    Hypokalemia 03/24/2024 will replete with 40 meq po kcl x 1 today. 03/25/24 - K normalized 3.8 --Monitor & replace K PRN   Diarrhea 03/22/2024 - Chronic and intermittent at home.  Low suspicion for acute infection at this time. -Encouraged her to continue discussions outpatient with primary care or GI to consider colonoscopy to further evaluate  03/23/2024 chronic in nature. Will need PCP follow up and outpatient GI referral.  03/24/2024 no complaints of diarrhea. Watch for worsening bowel function with IV abx.  12/15 -- diarrhea overnight, requested Imodium , ordered PRN.      Obesity (BMI 30.0-34.9) 03/25/2024 Body mass index is 31.08 kg/m.  CAD (coronary artery disease) 03/25/2024 continue ASA, plavix , lipitor , lopressor , avapro .   Mixed hyperlipidemia 03/25/2024 continue lipitor  80 mg daily.   Diabetes mellitus due to underlying condition with unspecified complications (HCC) 03/22/2024 - No home meds.  Last A1c was controlled earlier this year. CBG checks for now  03/25/2024 stable. Non-fasting glucose 112 on labs.   Essential hypertension 03/22/2024 Continue amlodipine  and ARB  03/23/2024 holding hydrochlorothiazide  for now given her recent N/V. Pt is eating and drinking well now.  Can restart her hydrochlorothiazide  after she is discharged.  03/24/2024 BP stable but slightly on the rise. Can consider restarting hydrochlorothiazide  soon. Continue norvasc  and ARB for now.  03/25/24 - BP elevated  today 177/77, AM meds had not been given yet.  Will re-assess BP after meds in effect.  Will plan to resume hydrochlorothiazide  if BP still elevated    Bulging lumbar disc 03/23/2024 chronic lumbar back pain. CT L-spine shows Extensive multilevel degenerative changes.  Prn tylenol . Pt does not use opiate narcotics.  03/25/2024 chronic. Prn tylenol     Depression, major, recurrent, in complete remission 03/25/2024 continue Effexor -XR   Glaucoma, both eyes 03/25/2024 Continue home eyedrops of Cosopt  and Xalatan            Consultants: none Procedures performed: none  Disposition: Home Diet recommendation: Carb controlled  DISCHARGE MEDICATION: Allergies as of 03/26/2024       Reactions   Zestril  [lisinopril ] Cough        Medication List     STOP taking these medications    amoxicillin  500 MG tablet Commonly known as: AMOXIL    Lumigan 0.01 % Soln Generic drug: bimatoprost       TAKE these medications    acetaminophen  650 MG CR tablet Commonly known as: TYLENOL  Take 650 mg by mouth 2 (two) times daily.   acidophilus Caps capsule Take 2 capsules by mouth daily for 14 days. Start taking on: March 27, 2024   amLODipine  10 MG tablet Commonly known as: NORVASC  Take 10 mg by mouth daily. What changed: Another medication with the same name was removed. Continue taking this medication, and follow the directions you  see here.   amoxicillin -clavulanate 875-125 MG tablet Commonly known as: AUGMENTIN  Take 1 tablet by mouth 2 (two) times daily for 4 days.   aspirin  EC 81 MG tablet Take 1 tablet (81 mg total) by mouth at bedtime.   atorvastatin  80 MG tablet Commonly known as: LIPITOR  Take 1 tablet (80 mg total) by mouth daily.   clopidogrel  75 MG tablet Commonly known as: PLAVIX  Take 1 tablet (75 mg total) by mouth daily.   diclofenac  Sodium 1 % Gel Commonly known as: VOLTAREN  Apply 2 g topically 4 (four) times daily.   dorzolamide -timolol   2-0.5 % ophthalmic solution Commonly known as: COSOPT  Place 1 drop into both eyes 2 (two) times daily.   feeding supplement Liqd Take 237 mLs by mouth 2 (two) times daily between meals.   fluticasone  50 MCG/ACT nasal spray Commonly known as: FLONASE  Place 2 sprays into both nostrils daily.   hydrochlorothiazide  12.5 MG tablet Commonly known as: HYDRODIURIL  Take 12.5 mg by mouth daily.   loperamide  2 MG capsule Commonly known as: IMODIUM  Take 1 capsule (2 mg total) by mouth as needed for diarrhea or loose stools.   metoprolol  tartrate 25 MG tablet Commonly known as: LOPRESSOR  TAKE 1 TABLET BY MOUTH TWICE A DAY   Multivitamin Women 50+ Tabs Take 1 tablet by mouth daily.   nabumetone  750 MG tablet Commonly known as: RELAFEN  Take 750 mg by mouth 2 (two) times daily.   nitroGLYCERIN  0.4 MG SL tablet Commonly known as: NITROSTAT  Place 0.4 mg under the tongue every 5 (five) minutes as needed for chest pain.   ondansetron  4 MG disintegrating tablet Commonly known as: ZOFRAN -ODT Take 1 tablet (4 mg total) by mouth every 4 (four) hours as needed.   pantoprazole  40 MG tablet Commonly known as: PROTONIX  Take 1 tablet (40 mg total) by mouth daily.   Rocklatan  0.02-0.005 % Soln Generic drug: Netarsudil -Latanoprost  Apply 1 drop to eye at bedtime.   valsartan  160 MG tablet Commonly known as: Diovan  Take 1 tablet (160 mg total) by mouth at bedtime.   venlafaxine  XR 150 MG 24 hr capsule Commonly known as: EFFEXOR -XR Take 1 capsule (150 mg total) by mouth daily with breakfast.        Follow-up Information     Moon, Amy A, NP Follow up.   Specialty: Internal Medicine Contact information: [address] [place] [address] [phone]                Discharge Exam: Fredricka Weights   03/21/24 1835 03/24/24 1600  Weight: 74.6 kg 74.6 kg   General exam: awake, alert, no acute distress HEENT: voice clear, hearing grossly normal  Respiratory system:  CTAB, no wheezes, rales or rhonchi, normal respiratory effort. Cardiovascular system: normal S1/S2, RRR Gastrointestinal system: soft, NT, ND, +bowel sounds. Central nervous system: A&O x3. no gross focal neurologic deficits, normal speech Psychiatry: normal mood, congruent affect, judgement and insight appear normal   Condition at discharge: stable  The results of significant diagnostics from this hospitalization (including imaging, microbiology, ancillary and laboratory) are listed below for reference.   Imaging Studies: CT ABDOMEN PELVIS W CONTRAST Result Date: 03/21/2024 CLINICAL DATA:  Abdominal pain. EXAM: CT ABDOMEN AND PELVIS WITH CONTRAST TECHNIQUE: Multidetector CT imaging of the abdomen and pelvis was performed using the standard protocol following bolus administration of intravenous contrast. RADIATION DOSE REDUCTION: This exam was performed according to the departmental dose-optimization program which includes automated exposure control, adjustment of the mA and/or kV according  to patient size and/or use of iterative reconstruction technique. CONTRAST:  75mL OMNIPAQUE  IOHEXOL  350 MG/ML SOLN COMPARISON:  CT abdomen pelvis dated 08/26/2013. FINDINGS: Lower chest: No acute abnormality. No intra-abdominal free air or free fluid. Hepatobiliary: The liver is unremarkable. No biliary dilatation. The gallbladder is unremarkable Pancreas: Unremarkable. No pancreatic ductal dilatation or surrounding inflammatory changes. Spleen: Normal in size without focal abnormality. Adrenals/Urinary Tract: The adrenal glands unremarkable there is no hydronephrosis on either side. Faint areas heterogeneous hypoenhancement of the upper pole of the right kidney concerning for pyelonephritis. Correlation with urinalysis recommended. No abscess. The visualized ureters and urinary bladder unremarkable. Stomach/Bowel: Residual food bolus noted in the distal esophagus. There is no bowel obstruction or active  inflammation. Mild sigmoid diverticulosis. Appendectomy. Vascular/Lymphatic: Moderate aortoiliac atherosclerotic disease. The IVC is unremarkable. No portal venous gas. There is no adenopathy. Reproductive: Hysterectomy.  No suspicious adnexal masses. Other: A 2.3 x 2.3 cm partially calcified lesion in the subcutaneous soft tissues of the anterior lower abdominal wall in the midline, likely sequela of post treatment changes or related to prior fat necrosis. Musculoskeletal: Osteopenia with degenerative changes. No acute osseous pathology. L2-L3 laminectomy. IMPRESSION: 1. Findings concerning for right-sided pyelonephritis. Correlation with urinalysis recommended. No abscess. 2. Sigmoid diverticulosis.  No bowel obstruction. 3.  Aortic Atherosclerosis (ICD10-I70.0). Electronically Signed   By: Vanetta Chou M.D.   On: 03/21/2024 19:17   CT L-SPINE NO CHARGE Result Date: 03/21/2024 CLINICAL DATA:  Back pain. EXAM: CT LUMBAR SPINE WITHOUT CONTRAST TECHNIQUE: Multidetector CT imaging of the lumbar spine was performed without intravenous contrast administration. Multiplanar CT image reconstructions were also generated. RADIATION DOSE REDUCTION: This exam was performed according to the departmental dose-optimization program which includes automated exposure control, adjustment of the mA and/or kV according to patient size and/or use of iterative reconstruction technique. COMPARISON:  CT abdomen pelvis dated 08/26/2013. FINDINGS: Segmentation: 5 lumbar type vertebrae. Alignment: No acute subluxation.  Grade 1 L2-L3 retrolisthesis. Vertebrae: No acute fracture. The bones are osteopenic. L2-L3 laminectomy. Paraspinal and other soft tissues: Negative. Disc levels: Extensive multilevel degenerative changes with disc desiccation and vacuum phenomena. Multilevel disc space narrowing and endplate irregularity. IMPRESSION: 1. No acute/traumatic lumbar spine pathology. 2. Extensive multilevel degenerative changes.  Electronically Signed   By: Vanetta Chou M.D.   On: 03/21/2024 18:58    Microbiology: Results for orders placed or performed in visit on 11/24/23  Ova and parasite examination     Status: None   Collection Time: 11/24/23 12:00 AM   Specimen: Stool  Result Value Ref Range Status   MICRO NUMBER: [id number]  Final   SPECIMEN QUALITY: Adequate  Final   Source STOOL  Final   STATUS: FINAL  Final   CONCENTRATE RESULT: No ova or parasites seen  Final   TRICHROME RESULT: No ova or parasites seen  Final   COMMENT:   Final    Routine Ova and Parasite exam may not detect some parasites that occasionally cause diarrheal illness. Cryptosporidium Antigen and/or Cyclospora and Isospora Exam may be ordered to detect these parasites. One negative sample does not necessarily rule out  the presence of a parasitic infection.  For additional information, please refer to https://education.[facility].com/faq/FAQ203 (This link is being provided for informational/ educational purposes only.)   Stool culture     Status: None   Collection Time: 11/24/23 12:12 PM   Specimen: Stool   Stool  Result Value Ref Range Status   Salmonella/Shigella Screen Final report  Final   Stool Culture  result 1 (RSASHR) Comment  Final    Comment: No Salmonella or Shigella recovered.   Campylobacter Culture Final report  Final   Stool Culture result 1 (CMPCXR) Comment  Final    Comment: No Campylobacter species isolated.   E coli, Shiga toxin Assay Negative Negative Final    Labs: CBC: Recent Labs  Lab 03/21/24 1511 03/22/24 0354 03/23/24 0417 03/24/24 0411  WBC 7.0 10.8* 5.9 4.3  NEUTROABS 4.2  --  4.1 2.3  HGB 14.4 13.6 12.9 12.5  HCT 43.9 40.0 38.1 37.6  MCV 106.6* 103.9* 103.0* 105.6*  PLT 217 192 171 162   Basic Metabolic Panel: Recent Labs  Lab 03/22/24 0354 03/23/24 0417 03/24/24 0411 03/25/24 1326 03/26/24 1234  NA 137 137 141 141 141  K 3.4* 3.5 3.4* 3.8 4.1  CL 101 106 108 108 107  CO2 25  25 22 22 23   GLUCOSE 202* 119* 122* 112* 140*  BUN 23 18 16 20 17   CREATININE 1.12* 1.09* 0.95 0.82 0.84  CALCIUM  8.5* 8.2* 8.2* 8.4* 8.8*  MG  --  1.5* 2.0  --   --    Liver Function Tests: Recent Labs  Lab 03/21/24 1511 03/24/24 0411  AST 24 21  ALT 19 15  ALKPHOS 78 63  BILITOT 1.1 0.6  PROT 7.9 5.7*  ALBUMIN 3.3* 2.3*   CBG: Recent Labs  Lab 03/23/24 0806 03/23/24 1207 03/23/24 1652 03/24/24 0743 03/24/24 1145  GLUCAP 200* 129* 138* 119* 137*    Discharge time spent: greater than 30 minutes.  Signed: Burnard Cassandra Cunning, DO [facility] 03/26/2024 "

## 2024-03-26 NOTE — Progress Notes (Signed)
 Arrived on scene for routine evening hospital at home visit. Patient was sitting in living room with husband in no distress. Patient had no complaints other than back pain, and requested tylenol . Evening meds administered per MAR.  Vitals obtained. Lunch sounds clear and equal. Heart is strong and regular. Bowel sounds active.   Iv flushed and curos capped.   Virtual visit with RN for eye drops, and pt requested 730am call for morning meds. IMM signed. Pt informed to call back with any questions.

## 2024-03-26 NOTE — Progress Notes (Signed)
 Arrived at PT home for discharge visit.  Arrived to find patient resting in POC in chair.  In no acute distress.  VS 98.9 BP 178/77 HR 68 RR 16 Spo2 96%   Alert and oriented x4, Pupils equal and reactive to light, RR even/unlabored with clear breath sounds auscultated bilaterally.  Regular rate and rhythm, abdomen obese, soft, nontender with positive bowel sounds x4, skin warm/pink/dry with + CMS to all four extremities.  AVS discussed with both patient and husband, all medications and next appointments reviewed and teachback demonstrated by both of them.  Pt and Husband verbalized understanding of DC and plan of care, no new concerns noted.  All additional questions answered prior to discharge.  IV DC with catheter intact, EMT Jackquline broke down RPM equipment and all items taken out of home prior to departure.

## 2024-03-26 NOTE — Progress Notes (Signed)
 Virtual visit with ms Tonner while Steffan, paramedic, was there. He is no distress. Her only complaint of pin is in her lower back. Voltaren  applied.

## 2024-03-26 NOTE — Progress Notes (Addendum)
 Arrived to patient    she is alert and oriented   she is mobile with the walker that she doesn't use   she went to use the bathroom after my arrival    she stated a loose stool but the only one today   she state a 4 in pain in her abdomin and back   she said it wasn't bad   gave her hydrochlorothiazide  and her rocephen    blood work was drawn   lung sounds are clear and bilateral   bowel sounds are moving   heart sounds are clear  patient has no complaints except for the low level pain of a 4  patients abdomen and back were palpated    patient states the pain is tolerable   reception out here has been very shotty at the least   had a difficult time in getting the computer up and running   had to do a teams call instead of on the tablet   possible discharge this afternoon

## 2024-03-26 NOTE — Progress Notes (Signed)
 This EMT and RN Carrico with the [facility]H  team went to the pt's residence for DC. Upon arrival the pt was sitting comfortably on the couch. The pt stated that she felt much better and was tankful for being apart of the H@H  program. RN Carrico then DC/d the pts PIV. This EMT gathered all H@H  equipment and place it into the H@H  vehicle. All DC paperwork was explained to the pt and her husband. Pt verbalized understandings at this time. No further questions were asked at this time. Virtual RN McWhite was made aware of the progress and DC.

## 2024-04-01 ENCOUNTER — Ambulatory Visit: Admitting: Physical Medicine and Rehabilitation

## 2024-04-01 ENCOUNTER — Encounter: Payer: Self-pay | Admitting: Physical Medicine and Rehabilitation

## 2024-04-01 DIAGNOSIS — M5416 Radiculopathy, lumbar region: Secondary | ICD-10-CM

## 2024-04-01 DIAGNOSIS — M797 Fibromyalgia: Secondary | ICD-10-CM

## 2024-04-01 DIAGNOSIS — M961 Postlaminectomy syndrome, not elsewhere classified: Secondary | ICD-10-CM | POA: Diagnosis not present

## 2024-04-01 DIAGNOSIS — M5441 Lumbago with sciatica, right side: Secondary | ICD-10-CM | POA: Diagnosis not present

## 2024-04-01 DIAGNOSIS — G8929 Other chronic pain: Secondary | ICD-10-CM | POA: Diagnosis not present

## 2024-04-01 DIAGNOSIS — M5442 Lumbago with sciatica, left side: Secondary | ICD-10-CM

## 2024-04-01 MED ORDER — BACLOFEN 10 MG PO TABS: 10.0000 mg | ORAL_TABLET | Freq: Every evening | ORAL | 0 refills | Status: DC | PRN

## 2024-04-01 NOTE — Progress Notes (Signed)
 Pain Scale   Average Pain 8 Patient advising she has chronic lower back pain radiating to bilateral hips. Pain is constant.        +Driver, -BT, -Dye Allergies. 8

## 2024-04-01 NOTE — Progress Notes (Signed)
 "  Cassandra Brooks - 83 y.o. female MRN [id number]  Date of birth: Sep 05, 1940  Office Visit Note: Visit Date: 04/01/2024 PCP: Erick Greig LABOR, NP Referred by: Erick Greig LABOR, NP  Subjective: Chief Complaint  Patient presents with   Lower Back - Pain   HPI: Cassandra Brooks is a 83 y.o. female who comes in today for evaluation of chronic, worsening and severe lower back pain and intermittently down both legs. Her daughter is accompanying her during our visit today. Patient was last seen in our office in 2024, since this time she was diagnosed with stroke and acute coronary syndrome.   Lower back pain ongoing for several years and worsens with activity and movement. She describes pain as sore and aching, currently rates as 9 out of 10. Some relief of pain with home exercise regimen, rest and use of medications. She has attended physical therapy in the past at [facility] in [place]. History of formal physical therapy with minimal relief of pain. Lumbar MRI imaging from 2023 shows postsurgical changes from L2 and L3 laminectomy (and likely discectomy) with improved/resolved spinal canal narrowing at L2-L3 and L3-L4. Mild to moderate spinal canal narrowing at L4-L5, unchanged. There is moderate multi level foraminal narrowing, unchanged. History of laminectomy decompression of L2-L3 and L3-L4 with Dr. Lynwood Better on 04/26/2021. Patient has undergone lumbar epidural steroid injections and facet joint injections in our office with minimal lasting relief of pain.   She is currently using cane to assist with ambulation. After talking with her daughter today, does seem patient is very sedentary and sits in recliner most of the day.       Review of Systems  Musculoskeletal:  Positive for back pain.  Neurological:  Negative for tingling, sensory change, focal weakness and weakness.  All other systems reviewed and are negative.  Otherwise per HPI.  Assessment & Plan: Visit Diagnoses:    ICD-10-CM   1.  Chronic bilateral low back pain with bilateral sciatica  M54.42 Ambulatory referral to Pain Clinic   M54.41 Ambulatory referral to Physical Therapy   G89.29 MR LUMBAR SPINE WO CONTRAST    2. Lumbar radiculopathy  M54.16 Ambulatory referral to Pain Clinic    Ambulatory referral to Physical Therapy    MR LUMBAR SPINE WO CONTRAST    3. Post laminectomy syndrome  M96.1 Ambulatory referral to Pain Clinic    Ambulatory referral to Physical Therapy    MR LUMBAR SPINE WO CONTRAST    4. Fibromyalgia  M79.7 Ambulatory referral to Pain Clinic    Ambulatory referral to Physical Therapy    MR LUMBAR SPINE WO CONTRAST       Plan: Findings:  Chronic, worsening and severe lower back pain and intermittently down both legs. Patient continues to have severe pain despite good conservative therapies such as formal physical therapy, home exercise regimen, rest and use of medications. Patients clinical presentation and exam are consistent with lumbar radiculopathy. Her pain is more non dermatomal. I did review lumbar MRI imaging from 2024. There is mild to moderate spinal canal stenosis at L4-L5 that could potentially cause pain. We discussed treatment plan in detail today. I prescribed short course of Baclofen  and place referral for chronic pain management to [facility]. I also placed order for in home physical therapy. Her daughter would like to obtain new lumbar MRI imaging. This will look for any new structural changes. Depending on results of MRI imaging we would consider performing injection at the L4-L5 level  at least once diagnostically. She has no questions at this time. We will touch base with her when lumbar MRI report is back. No red flag symptoms noted upon exam today.     Meds & Orders:  Meds ordered this encounter  Medications   baclofen  (LIORESAL ) 10 MG tablet    Sig: Take 1 tablet (10 mg total) by mouth at bedtime as needed for muscle spasms.    Dispense:  30 each    Refill:  0     Orders Placed This Encounter  Procedures   MR LUMBAR SPINE WO CONTRAST   Ambulatory referral to Pain Clinic   Ambulatory referral to Physical Therapy    Follow-up: Return for Lumbar MRI review.   Procedures: No procedures performed      Clinical History: CLINICAL DATA:  Low back   EXAM: MRI LUMBAR SPINE WITHOUT CONTRAST   TECHNIQUE: Multiplanar, multisequence MR imaging of the lumbar spine was performed. No intravenous contrast was administered.   COMPARISON:  MRI L Spine 02/11/21   FINDINGS: Segmentation: Standard. Last well-formed disc space is labeled L5-S1. There is a small rudimentary disc at S1-S2.   Alignment:  Trace retrolisthesis L2 on L3 L4 on L5   Vertebrae: Redemonstrated multilevel degenerative endplate changes (Modic changes) throughout the lumbar spine   Conus medullaris and cauda equina: Conus extends to the L2 level. Conus and cauda equina appear normal.   Paraspinal and other soft tissues: Negative.   Disc levels:   T12-L1: Unremarkable   L1-L2: Mild bilateral facet degenerative change. No significant disc bulge. No spurring. No neural foraminal narrowing.   L2-L3: L2 laminectomy. No spinal canal narrowing. Moderate bilateral facet degenerative change. Circumferential disc bulge, decreased from prior. No spinal canal narrowing. Moderate bilateral neural foraminal narrowing.   L3-L4: L3 laminectomy. No spinal canal narrowing. Circumferential disc bulge, improved from prior. Moderate bilateral neural foraminal narrowing. Mild bilateral facet degenerative change.   L4-L5: Severe bilateral facet degenerative change. Circumferential disc bulge. Mild to moderate spinal canal narrowing. Moderate bilateral neural foraminal narrowing. Findings are unchanged from prior exam.   L5-S1: Mild bilateral facet degenerative change. Circumferential disc bulge. No spinal canal narrowing. Moderate right and moderate to severe left neural foraminal  narrowing. Findings are unchanged from prior exam.   IMPRESSION: 1. Postsurgical changes from L2 and L3 laminectomy (and likely discectomy) with improved/resolved spinal canal narrowing at L2-L3 and L3-L4. 2. Mild to moderate spinal canal narrowing at L4-L5, unchanged. 3. Unchanged moderate bilateral neural foraminal narrowing at L2-L3, L3-L4, and L4-L5. Unchanged moderate to severe left neural foraminal narrowing at L5-S1.     Electronically Signed   By: Lyndall Gore M.D.   On: 07/20/2022 08:40   She reports that she has never smoked. She has never used smokeless tobacco.  Recent Labs    08/04/23 0201  HGBA1C 6.1*    Objective:  VS:  HT:    WT:   BMI:     BP:   HR: bpm  TEMP: ( )  RESP:  Physical Exam Vitals and nursing note reviewed.  HENT:     Head: Normocephalic and atraumatic.     Right Ear: External ear normal.     Left Ear: External ear normal.     Nose: Nose normal.     Mouth/Throat:     Mouth: Mucous membranes are moist.  Eyes:     Extraocular Movements: Extraocular movements intact.  Cardiovascular:     Rate and Rhythm: Normal rate.  Pulses: Normal pulses.  Pulmonary:     Effort: Pulmonary effort is normal.  Abdominal:     General: Abdomen is flat. There is no distension.  Musculoskeletal:        General: Tenderness present.     Cervical back: Normal range of motion.     Comments: Patient is slow to rise from seated position to standing. Pain noted with facet loading and lumbar extension. 5/5 strength noted with bilateral hip flexion, knee flexion/extension, ankle dorsiflexion/plantarflexion and EHL. No clonus noted bilaterally. No pain upon palpation of greater trochanters. No pain with internal/external rotation of bilateral hips. Sensation intact bilaterally. Negative slump test bilaterally. Ambulates with cane, gait slow and unsteady.    Skin:    General: Skin is warm and dry.     Capillary Refill: Capillary refill takes less than 2 seconds.   Neurological:     General: No focal deficit present.     Mental Status: She is alert and oriented to person, place, and time.  Psychiatric:        Mood and Affect: Mood normal.        Behavior: Behavior normal.     Ortho Exam  Imaging: No results found.  Past Medical/Family/Surgical/Social History: Medications & Allergies reviewed per EMR, new medications updated. Patient Active Problem List   Diagnosis Date Noted   Hypokalemia 03/24/2024   Hypomagnesemia 03/23/2024   Diarrhea 03/22/2024   Acute pyelonephritis 03/21/2024   Obesity (BMI 30.0-34.9) 09/29/2022   CAD (coronary artery disease) 08/17/2022   Spinal stenosis of lumbar region with neurogenic claudication 04/26/2021   Primary osteoarthritis of left foot 10/21/2020   Plantar fasciitis 10/21/2020   Pre-diabetes    Essential hypertension 12/19/2019   Diabetes mellitus due to underlying condition with unspecified complications (HCC) 12/19/2019   Glaucoma, both eyes    GERD (gastroesophageal reflux disease)    Fibromyalgia    Depression, major, recurrent, in complete remission    Bulging lumbar disc    Arthritis    AAA (abdominal aortic aneurysm)    Primary osteoarthritis of left knee 07/18/2019   Mixed hyperlipidemia 04/18/2017   Solitary pulmonary nodule 03/18/2015   Nuclear cataract of both eyes 01/01/2014   Past Medical History:  Diagnosis Date   AAA (abdominal aortic aneurysm)    ACS (acute coronary syndrome) (HCC) 08/01/2022   Arthritis    knees (03/21/2017)   Borderline diabetes    Bulging lumbar disc    CAD (coronary artery disease) 08/17/2022   Depression    Diabetes mellitus due to underlying condition with unspecified complications (HCC) 12/19/2019   Disequilibrium    Dizziness    Essential hypertension 12/19/2019   Fall    Fibromyalgia    GERD (gastroesophageal reflux disease)    Glaucoma, both eyes    Hemoptysis 03/18/2015   Hyperlipidemia    don't have it but I take RX (03/21/2017)    Hypertension    Mixed dyslipidemia 12/19/2019   Mixed hyperlipidemia 04/18/2017   Nuclear cataract of both eyes 01/01/2014   Obesity (BMI 30.0-34.9) 09/29/2022   Plantar fasciitis 10/21/2020   Posterior tibial tendon dysfunction (PTTD) of right lower extremity 11/16/2021   Pre-diabetes    pt states she is not diabetic. Last A1C was normal. Pt is on no medications for DM   Preoperative cardiovascular examination 12/19/2019   Primary osteoarthritis of left foot 10/21/2020   Primary osteoarthritis of left knee 07/18/2019   Solitary pulmonary nodule 03/18/2015   03/2015 6mm RLL nodule  Spinal stenosis of lumbar region with neurogenic claudication 04/26/2021   Spinal stenosis, lumbar region with neurogenic claudication 04/26/2021   Central stenosis, L2-3 and L3-4 with moderate foramenal narrowing L4-5   Status post AAA (abdominal aortic aneurysm) repair 12/19/2019   Status post total left knee replacement 03/27/2020   STEMI (ST elevation myocardial infarction) (HCC) 08/01/2022   STEMI involving right coronary artery (HCC) 07/29/2022   Syncope 03/21/2017   Syncope and collapse    TIA (transient ischemic attack) 08/03/2023   Family History  Problem Relation Age of Onset   Hypertension Father    Stomach cancer Father    Glaucoma Mother    Diabetes Mother    Diabetes Sister    Diabetes Brother    Lupus Child    Diabetes Brother    Lung cancer Brother    Colon cancer Brother    Past Surgical History:  Procedure Laterality Date   ABDOMINAL AORTIC ANEURYSM REPAIR  1966   APPENDECTOMY  1966   CARDIAC CATHETERIZATION     20+ years ago, no abnormalities found   COLONOSCOPY  11/02/2016   Dr Towana. A few diverticuli were seen in the sigmoid colon. Otherwise normal colonoscopy to cecum   CORONARY/GRAFT ACUTE MI REVASCULARIZATION N/A 07/29/2022   Procedure: Coronary/Graft Acute MI Revascularization;  Surgeon: Burnard Debby LABOR, MD;  Location: [facility] INVASIVE CV LAB;  Service: Cardiovascular;   Laterality: N/A;   LEFT HEART CATH AND CORONARY ANGIOGRAPHY N/A 07/29/2022   Procedure: LEFT HEART CATH AND CORONARY ANGIOGRAPHY;  Surgeon: Burnard Debby LABOR, MD;  Location: [facility] INVASIVE CV LAB;  Service: Cardiovascular;  Laterality: N/A;   LUMBAR LAMINECTOMY/DECOMPRESSION MICRODISCECTOMY N/A 04/26/2021   Procedure: CENTRAL LAMINECTOMIES LUMBAR TWO-THREE,  LUMBAR THREE-FOUR WITH BILATERAL FORAMINOTOMIES;  Surgeon: Lucilla Lynwood BRAVO, MD;  Location: [facility] OR;  Service: Orthopedics;  Laterality: N/A;   TOTAL KNEE ARTHROPLASTY Left 03/27/2020   Procedure: LEFT TOTAL KNEE ARTHROPLASTY;  Surgeon: Jerri Kay HERO, MD;  Location: [facility] OR;  Service: Orthopedics;  Laterality: Left;   VAGINAL HYSTERECTOMY     partial   VIDEO BRONCHOSCOPY Bilateral 03/19/2015   Procedure: VIDEO BRONCHOSCOPY WITHOUT FLUORO;  Surgeon: Harden Jude GAILS, MD;  Location: [facility] ENDOSCOPY;  Service: Cardiopulmonary;  Laterality: Bilateral;   Social History   Occupational History   Occupation: retired    Comment: designer, fashion/clothing; dietician   Occupation: retired  Tobacco Use   Smoking status: Never   Smokeless tobacco: Never  Vaping Use   Vaping status: Never Used  Substance and Sexual Activity   Alcohol use: No   Drug use: No   Sexual activity: Not on file    "

## 2024-04-05 ENCOUNTER — Emergency Department (HOSPITAL_COMMUNITY)

## 2024-04-05 ENCOUNTER — Telehealth: Payer: Self-pay | Admitting: *Deleted

## 2024-04-05 ENCOUNTER — Encounter (HOSPITAL_COMMUNITY): Payer: Self-pay | Admitting: Family Medicine

## 2024-04-05 ENCOUNTER — Inpatient Hospital Stay (HOSPITAL_COMMUNITY)
Admission: EM | Admit: 2024-04-05 | Discharge: 2024-04-11 | DRG: 372 | Disposition: A | Discharge status: Home Health | Attending: Family Medicine | Admitting: Family Medicine

## 2024-04-05 ENCOUNTER — Other Ambulatory Visit: Payer: Self-pay

## 2024-04-05 DIAGNOSIS — Z79899 Other long term (current) drug therapy: Secondary | ICD-10-CM | POA: Diagnosis not present

## 2024-04-05 DIAGNOSIS — R079 Chest pain, unspecified: Secondary | ICD-10-CM | POA: Diagnosis not present

## 2024-04-05 DIAGNOSIS — M48062 Spinal stenosis, lumbar region with neurogenic claudication: Secondary | ICD-10-CM | POA: Diagnosis present

## 2024-04-05 DIAGNOSIS — M797 Fibromyalgia: Secondary | ICD-10-CM | POA: Diagnosis present

## 2024-04-05 DIAGNOSIS — Z1152 Encounter for screening for COVID-19: Secondary | ICD-10-CM | POA: Diagnosis not present

## 2024-04-05 DIAGNOSIS — Z801 Family history of malignant neoplasm of trachea, bronchus and lung: Secondary | ICD-10-CM

## 2024-04-05 DIAGNOSIS — Z96652 Presence of left artificial knee joint: Secondary | ICD-10-CM | POA: Diagnosis present

## 2024-04-05 DIAGNOSIS — I471 Supraventricular tachycardia, unspecified: Secondary | ICD-10-CM | POA: Diagnosis not present

## 2024-04-05 DIAGNOSIS — N12 Tubulo-interstitial nephritis, not specified as acute or chronic: Secondary | ICD-10-CM | POA: Diagnosis not present

## 2024-04-05 DIAGNOSIS — H409 Unspecified glaucoma: Secondary | ICD-10-CM | POA: Diagnosis present

## 2024-04-05 DIAGNOSIS — I2510 Atherosclerotic heart disease of native coronary artery without angina pectoris: Secondary | ICD-10-CM | POA: Diagnosis present

## 2024-04-05 DIAGNOSIS — R531 Weakness: Secondary | ICD-10-CM

## 2024-04-05 DIAGNOSIS — B9620 Unspecified Escherichia coli [E. coli] as the cause of diseases classified elsewhere: Secondary | ICD-10-CM | POA: Diagnosis not present

## 2024-04-05 DIAGNOSIS — Z800 Family history of malignant neoplasm of digestive organs: Secondary | ICD-10-CM

## 2024-04-05 DIAGNOSIS — I2582 Chronic total occlusion of coronary artery: Secondary | ICD-10-CM | POA: Diagnosis present

## 2024-04-05 DIAGNOSIS — Z8673 Personal history of transient ischemic attack (TIA), and cerebral infarction without residual deficits: Secondary | ICD-10-CM

## 2024-04-05 DIAGNOSIS — D7589 Other specified diseases of blood and blood-forming organs: Secondary | ICD-10-CM | POA: Diagnosis present

## 2024-04-05 DIAGNOSIS — R7303 Prediabetes: Secondary | ICD-10-CM | POA: Diagnosis present

## 2024-04-05 DIAGNOSIS — Z833 Family history of diabetes mellitus: Secondary | ICD-10-CM | POA: Diagnosis not present

## 2024-04-05 DIAGNOSIS — Z7902 Long term (current) use of antithrombotics/antiplatelets: Secondary | ICD-10-CM | POA: Diagnosis not present

## 2024-04-05 DIAGNOSIS — Z83511 Family history of glaucoma: Secondary | ICD-10-CM

## 2024-04-05 DIAGNOSIS — R197 Diarrhea, unspecified: Secondary | ICD-10-CM | POA: Diagnosis not present

## 2024-04-05 DIAGNOSIS — I10 Essential (primary) hypertension: Secondary | ICD-10-CM | POA: Diagnosis present

## 2024-04-05 DIAGNOSIS — Z8249 Family history of ischemic heart disease and other diseases of the circulatory system: Secondary | ICD-10-CM

## 2024-04-05 DIAGNOSIS — F32A Depression, unspecified: Secondary | ICD-10-CM | POA: Diagnosis present

## 2024-04-05 DIAGNOSIS — G8929 Other chronic pain: Secondary | ICD-10-CM | POA: Diagnosis present

## 2024-04-05 DIAGNOSIS — R159 Full incontinence of feces: Secondary | ICD-10-CM | POA: Diagnosis present

## 2024-04-05 DIAGNOSIS — E782 Mixed hyperlipidemia: Secondary | ICD-10-CM | POA: Diagnosis present

## 2024-04-05 DIAGNOSIS — Z87440 Personal history of urinary (tract) infections: Secondary | ICD-10-CM

## 2024-04-05 DIAGNOSIS — R112 Nausea with vomiting, unspecified: Secondary | ICD-10-CM

## 2024-04-05 DIAGNOSIS — I639 Cerebral infarction, unspecified: Secondary | ICD-10-CM | POA: Diagnosis not present

## 2024-04-05 DIAGNOSIS — I252 Old myocardial infarction: Secondary | ICD-10-CM

## 2024-04-05 DIAGNOSIS — Z1612 Extended spectrum beta lactamase (ESBL) resistance: Secondary | ICD-10-CM | POA: Diagnosis not present

## 2024-04-05 DIAGNOSIS — A0472 Enterocolitis due to Clostridium difficile, not specified as recurrent: Secondary | ICD-10-CM | POA: Diagnosis present

## 2024-04-05 DIAGNOSIS — R32 Unspecified urinary incontinence: Secondary | ICD-10-CM | POA: Diagnosis present

## 2024-04-05 DIAGNOSIS — Z955 Presence of coronary angioplasty implant and graft: Secondary | ICD-10-CM | POA: Diagnosis not present

## 2024-04-05 DIAGNOSIS — Z22359 Carrier of enterobacterales, unspecified: Secondary | ICD-10-CM

## 2024-04-05 DIAGNOSIS — R109 Unspecified abdominal pain: Secondary | ICD-10-CM | POA: Diagnosis present

## 2024-04-05 DIAGNOSIS — Z7982 Long term (current) use of aspirin: Secondary | ICD-10-CM

## 2024-04-05 DIAGNOSIS — Z888 Allergy status to other drugs, medicaments and biological substances status: Secondary | ICD-10-CM

## 2024-04-05 DIAGNOSIS — K219 Gastro-esophageal reflux disease without esophagitis: Secondary | ICD-10-CM | POA: Diagnosis present

## 2024-04-05 DIAGNOSIS — E088 Diabetes mellitus due to underlying condition with unspecified complications: Secondary | ICD-10-CM | POA: Diagnosis present

## 2024-04-05 LAB — PHOSPHORUS: Phosphorus: 3.2 mg/dL (ref 2.5–4.6)

## 2024-04-05 LAB — URINALYSIS, ROUTINE W REFLEX MICROSCOPIC
Bilirubin Urine: NEGATIVE
Glucose, UA: NEGATIVE mg/dL
Hgb urine dipstick: NEGATIVE
Ketones, ur: NEGATIVE mg/dL
Nitrite: NEGATIVE
Protein, ur: NEGATIVE mg/dL
Specific Gravity, Urine: 1.016 (ref 1.005–1.030)
WBC, UA: >50 WBC/hpf (ref 0–5)
pH: 5 (ref 5.0–8.0)

## 2024-04-05 LAB — COMPREHENSIVE METABOLIC PANEL WITH GFR
ALT: 21 U/L (ref 0–44)
AST: 26 U/L (ref 15–41)
Albumin: 3.9 g/dL (ref 3.5–5.0)
Alkaline Phosphatase: 89 U/L (ref 38–126)
Anion gap: 13 (ref 5–15)
BUN: 21 mg/dL (ref 8–23)
CO2: 22 mmol/L (ref 22–32)
Calcium: 9.3 mg/dL (ref 8.9–10.3)
Chloride: 103 mmol/L (ref 98–111)
Creatinine, Ser: 1.09 mg/dL — ABNORMAL HIGH (ref 0.44–1.00)
GFR, Estimated: 50 mL/min — ABNORMAL LOW
Glucose, Bld: 198 mg/dL — ABNORMAL HIGH (ref 70–99)
Potassium: 3.9 mmol/L (ref 3.5–5.1)
Sodium: 138 mmol/L (ref 135–145)
Total Bilirubin: 0.4 mg/dL (ref 0.0–1.2)
Total Protein: 7.1 g/dL (ref 6.5–8.1)

## 2024-04-05 LAB — CBC
HCT: 40.9 % (ref 36.0–46.0)
HCT: 36.7 % (ref 36.0–46.0)
Hemoglobin: 13.3 g/dL (ref 12.0–15.0)
Hemoglobin: 12.2 g/dL (ref 12.0–15.0)
MCH: 35.2 pg — ABNORMAL HIGH (ref 26.0–34.0)
MCH: 34.6 pg — ABNORMAL HIGH (ref 26.0–34.0)
MCHC: 32.5 g/dL (ref 30.0–36.0)
MCHC: 33.2 g/dL (ref 30.0–36.0)
MCV: 108.2 fL — ABNORMAL HIGH (ref 80.0–100.0)
MCV: 104 fL — ABNORMAL HIGH (ref 80.0–100.0)
Platelets: 251 K/uL (ref 150–400)
Platelets: 241 K/uL (ref 150–400)
RBC: 3.78 MIL/uL — ABNORMAL LOW (ref 3.87–5.11)
RBC: 3.53 MIL/uL — ABNORMAL LOW (ref 3.87–5.11)
RDW: 13.2 % (ref 11.5–15.5)
RDW: 13.3 % (ref 11.5–15.5)
WBC: 7 K/uL (ref 4.0–10.5)
WBC: 5.7 K/uL (ref 4.0–10.5)
nRBC: 0 % (ref 0.0–0.2)
nRBC: 0 % (ref 0.0–0.2)

## 2024-04-05 LAB — C DIFFICILE QUICK SCREEN W PCR REFLEX
C Diff antigen: POSITIVE — AB
C Diff interpretation: DETECTED
C Diff toxin: POSITIVE — AB

## 2024-04-05 LAB — MAGNESIUM: Magnesium: 1.6 mg/dL — ABNORMAL LOW (ref 1.7–2.4)

## 2024-04-05 LAB — GLUCOSE, CAPILLARY
Glucose-Capillary: 181 mg/dL — ABNORMAL HIGH (ref 70–99)
Glucose-Capillary: 177 mg/dL — ABNORMAL HIGH (ref 70–99)

## 2024-04-05 LAB — URINALYSIS, W/ REFLEX TO CULTURE (INFECTION SUSPECTED)
Bilirubin Urine: NEGATIVE
Glucose, UA: NEGATIVE mg/dL
Hgb urine dipstick: NEGATIVE
Ketones, ur: NEGATIVE mg/dL
Nitrite: NEGATIVE
Protein, ur: NEGATIVE mg/dL
Specific Gravity, Urine: 1.025 (ref 1.005–1.030)
pH: 5 (ref 5.0–8.0)

## 2024-04-05 LAB — PROTIME-INR
INR: 1.1 (ref 0.8–1.2)
Prothrombin Time: 15.3 s — ABNORMAL HIGH (ref 11.4–15.2)

## 2024-04-05 LAB — HEMOGLOBIN A1C
Hgb A1c MFr Bld: 6.3 % — ABNORMAL HIGH (ref 4.8–5.6)
Mean Plasma Glucose: 134.11 mg/dL

## 2024-04-05 LAB — LIPASE, BLOOD: Lipase: 19 U/L (ref 11–51)

## 2024-04-05 LAB — RESP PANEL BY RT-PCR (RSV, FLU A&B, COVID)  RVPGX2
Influenza A by PCR: NEGATIVE
Influenza B by PCR: NEGATIVE
Resp Syncytial Virus by PCR: NEGATIVE
SARS Coronavirus 2 by RT PCR: NEGATIVE

## 2024-04-05 MED ORDER — BACLOFEN 10 MG PO TABS
10.0000 mg | ORAL_TABLET | Freq: Every evening | ORAL | Status: DC | PRN
  Administered 2024-04-06: 10 mg via ORAL
  Filled 2024-04-05: qty 1

## 2024-04-05 MED ORDER — OXYCODONE HCL 5 MG PO TABS
5.0000 mg | ORAL_TABLET | ORAL | Status: DC | PRN
  Administered 2024-04-05 – 2024-04-09 (×3): 5 mg via ORAL
  Filled 2024-04-05: qty 1

## 2024-04-05 MED ORDER — HYDROMORPHONE HCL 1 MG/ML IJ SOLN
0.5000 mg | INTRAMUSCULAR | Status: DC | PRN
  Administered 2024-04-08 (×2): 1 mg via INTRAVENOUS

## 2024-04-05 MED ORDER — ADULT MULTIVITAMIN W/MINERALS CH
1.0000 | ORAL_TABLET | Freq: Every day | ORAL | Status: DC
  Administered 2024-04-06 – 2024-04-11 (×6): 1 via ORAL
  Filled 2024-04-05 (×3): qty 1

## 2024-04-05 MED ORDER — IRBESARTAN 75 MG PO TABS
75.0000 mg | ORAL_TABLET | Freq: Every day | ORAL | Status: DC
  Administered 2024-04-05 – 2024-04-11 (×7): 75 mg via ORAL
  Filled 2024-04-05 (×3): qty 1

## 2024-04-05 MED ORDER — LACTATED RINGERS IV BOLUS
1000.0000 mL | Freq: Once | INTRAVENOUS | Status: AC
  Administered 2024-04-05: 1000 mL via INTRAVENOUS

## 2024-04-05 MED ORDER — DICLOFENAC SODIUM 1 % EX GEL
2.0000 g | Freq: Four times a day (QID) | CUTANEOUS | Status: DC
  Administered 2024-04-05 – 2024-04-11 (×23): 2 g via TOPICAL
  Filled 2024-04-05: qty 100

## 2024-04-05 MED ORDER — DEXAMETHASONE SODIUM PHOSPHATE 4 MG/ML IJ SOLN
4.0000 mg | Freq: Two times a day (BID) | INTRAMUSCULAR | Status: DC
  Administered 2024-04-05 – 2024-04-06 (×3): 4 mg via INTRAVENOUS
  Filled 2024-04-05 (×4): qty 1

## 2024-04-05 MED ORDER — METOPROLOL TARTRATE 25 MG PO TABS
25.0000 mg | ORAL_TABLET | Freq: Two times a day (BID) | ORAL | Status: DC
  Administered 2024-04-05 – 2024-04-07 (×4): 25 mg via ORAL
  Filled 2024-04-05 (×4): qty 1

## 2024-04-05 MED ORDER — CLOPIDOGREL BISULFATE 75 MG PO TABS
75.0000 mg | ORAL_TABLET | Freq: Every day | ORAL | Status: DC
  Administered 2024-04-06 – 2024-04-11 (×6): 75 mg via ORAL
  Filled 2024-04-05 (×3): qty 1

## 2024-04-05 MED ORDER — ONDANSETRON HCL 4 MG/2ML IJ SOLN
4.0000 mg | Freq: Once | INTRAMUSCULAR | Status: AC
  Administered 2024-04-05: 4 mg via INTRAVENOUS
  Filled 2024-04-05: qty 2

## 2024-04-05 MED ORDER — ATORVASTATIN CALCIUM 80 MG PO TABS
80.0000 mg | ORAL_TABLET | Freq: Every day | ORAL | Status: DC
  Administered 2024-04-06 – 2024-04-11 (×6): 80 mg via ORAL
  Filled 2024-04-05 (×3): qty 1

## 2024-04-05 MED ORDER — SODIUM CHLORIDE 0.9 % IV SOLN
2.0000 g | INTRAVENOUS | Status: DC
  Administered 2024-04-05 – 2024-04-06 (×2): 2 g via INTRAVENOUS
  Filled 2024-04-05 (×2): qty 20

## 2024-04-05 MED ORDER — IOHEXOL 350 MG/ML SOLN
75.0000 mL | Freq: Once | INTRAVENOUS | Status: AC | PRN
  Administered 2024-04-05: 75 mL via INTRAVENOUS

## 2024-04-05 MED ORDER — IPRATROPIUM BROMIDE 0.02 % IN SOLN: 0.5000 mg | Freq: Four times a day (QID) | RESPIRATORY_TRACT | Status: DC | PRN

## 2024-04-05 MED ORDER — SODIUM CHLORIDE 0.9 % IV SOLN: INTRAVENOUS | Status: AC

## 2024-04-05 MED ORDER — INSULIN ASPART 100 UNIT/ML IJ SOLN
0.0000 [IU] | Freq: Three times a day (TID) | INTRAMUSCULAR | Status: DC
  Administered 2024-04-05: 1 [IU] via SUBCUTANEOUS
  Administered 2024-04-06 (×2): 2 [IU] via SUBCUTANEOUS
  Administered 2024-04-06 – 2024-04-11 (×2): 1 [IU] via SUBCUTANEOUS
  Filled 2024-04-05 (×2): qty 2
  Filled 2024-04-05 (×2): qty 1

## 2024-04-05 MED ORDER — ACETAMINOPHEN 325 MG PO TABS: 650.0000 mg | ORAL_TABLET | Freq: Four times a day (QID) | ORAL | Status: DC | PRN

## 2024-04-05 MED ORDER — MORPHINE SULFATE (PF) 4 MG/ML IV SOLN
4.0000 mg | Freq: Once | INTRAVENOUS | Status: AC
  Administered 2024-04-05: 4 mg via INTRAVENOUS
  Filled 2024-04-05: qty 1

## 2024-04-05 MED ORDER — RISAQUAD PO CAPS
1.0000 | ORAL_CAPSULE | Freq: Three times a day (TID) | ORAL | Status: DC
  Administered 2024-04-05 – 2024-04-11 (×17): 1 via ORAL
  Filled 2024-04-05 (×7): qty 1

## 2024-04-05 MED ORDER — ONDANSETRON HCL 4 MG PO TABS
4.0000 mg | ORAL_TABLET | Freq: Four times a day (QID) | ORAL | Status: DC | PRN
  Administered 2024-04-05 – 2024-04-06 (×2): 4 mg via ORAL
  Filled 2024-04-05 (×2): qty 1

## 2024-04-05 MED ORDER — LORAZEPAM 2 MG/ML IJ SOLN: 1.0000 mg | Freq: Once | INTRAMUSCULAR | Status: DC | PRN

## 2024-04-05 MED ORDER — HEPARIN SODIUM (PORCINE) 5000 UNIT/ML IJ SOLN
5000.0000 [IU] | Freq: Three times a day (TID) | INTRAMUSCULAR | Status: DC
  Administered 2024-04-05 – 2024-04-11 (×18): 5000 [IU] via SUBCUTANEOUS
  Filled 2024-04-05 (×8): qty 1

## 2024-04-05 MED ORDER — TRAZODONE HCL 50 MG PO TABS
25.0000 mg | ORAL_TABLET | Freq: Every evening | ORAL | Status: DC | PRN
  Administered 2024-04-06 – 2024-04-07 (×2): 25 mg via ORAL
  Filled 2024-04-05 (×2): qty 1

## 2024-04-05 MED ORDER — ACETAMINOPHEN 650 MG RE SUPP: 650.0000 mg | Freq: Four times a day (QID) | RECTAL | Status: DC | PRN

## 2024-04-05 MED ORDER — SODIUM CHLORIDE 0.9 % IV SOLN: 1.0000 g | Freq: Two times a day (BID) | INTRAVENOUS | Status: DC

## 2024-04-05 MED ORDER — HYDRALAZINE HCL 20 MG/ML IJ SOLN
10.0000 mg | INTRAMUSCULAR | Status: DC | PRN
  Administered 2024-04-05: 10 mg via INTRAVENOUS
  Filled 2024-04-05: qty 1

## 2024-04-05 MED ORDER — ONDANSETRON HCL 4 MG/2ML IJ SOLN: 4.0000 mg | Freq: Four times a day (QID) | INTRAMUSCULAR | Status: DC | PRN

## 2024-04-05 MED ORDER — DORZOLAMIDE HCL-TIMOLOL MAL 2-0.5 % OP SOLN
1.0000 [drp] | Freq: Two times a day (BID) | OPHTHALMIC | Status: DC
  Administered 2024-04-05 – 2024-04-11 (×12): 1 [drp] via OPHTHALMIC
  Filled 2024-04-05: qty 10

## 2024-04-05 MED ORDER — SODIUM CHLORIDE 0.9% FLUSH
3.0000 mL | Freq: Two times a day (BID) | INTRAVENOUS | Status: DC
  Administered 2024-04-05 – 2024-04-10 (×5): 3 mL via INTRAVENOUS

## 2024-04-05 MED ORDER — NITROGLYCERIN 0.4 MG SL SUBL
0.4000 mg | SUBLINGUAL_TABLET | SUBLINGUAL | Status: DC | PRN
  Administered 2024-04-07: 0.4 mg via SUBLINGUAL

## 2024-04-05 MED ORDER — VENLAFAXINE HCL ER 75 MG PO CP24
150.0000 mg | ORAL_CAPSULE | Freq: Every day | ORAL | Status: DC
  Administered 2024-04-06 – 2024-04-11 (×6): 150 mg via ORAL
  Filled 2024-04-05 (×2): qty 2

## 2024-04-05 MED ORDER — AMLODIPINE BESYLATE 10 MG PO TABS
10.0000 mg | ORAL_TABLET | Freq: Every day | ORAL | Status: DC
  Administered 2024-04-06 – 2024-04-11 (×6): 10 mg via ORAL
  Filled 2024-04-05: qty 1
  Filled 2024-04-05: qty 2
  Filled 2024-04-05: qty 1

## 2024-04-05 MED ORDER — SODIUM CHLORIDE 0.9% FLUSH
3.0000 mL | Freq: Two times a day (BID) | INTRAVENOUS | Status: DC
  Administered 2024-04-05 – 2024-04-11 (×9): 3 mL via INTRAVENOUS

## 2024-04-05 NOTE — Assessment & Plan Note (Signed)
 Continue statins

## 2024-04-05 NOTE — Assessment & Plan Note (Signed)
-   Acute on chronic diarrhea -Likely exacerbated by recent antibiotics -Chronic stool incontinence he -Ruling out C. difficile colitis-pending stool studies -May start empiric Flagyl  (needs antibiotics for recurrent pyelonephritis-curbside and ID) - Continue lactobacillus

## 2024-04-05 NOTE — ED Triage Notes (Addendum)
 Pt c/o N/V/D, dysuria, and abd and back pain since 12/11 when she was seen for the same, was told she had a UTI, placed on amoxicillin , finished abx; reports no improvement, endorses fever yesterday

## 2024-04-05 NOTE — ED Provider Notes (Signed)
 "  EMERGENCY DEPARTMENT AT [facility] Provider Note   CSN: [id number] Arrival date & time: 04/05/24  9264     Patient presents with: Abdominal Pain   Cassandra Brooks is a 83 y.o. female.  Abdominal Pain Associated symptoms: diarrhea, nausea and vomiting   Patient is an 83 year old female presenting ED today for concerns for generalized abdominal pain localizing to the epigastric region accompanied with worsening back pain, nausea, nonbilious/nonbloody vomiting, watery diarrhea, body aches, chills, subjective fever which was noted to be accompanied with worsening acute on chronic back pain and increased fecal and urinary incontinence.  Having over the last 3 days, progressively worse today.  Notably was on hospital at home care until 12/16 noted to have had similar symptoms but reports that today symptoms are worse than when she was previously admitted to hospital at home.  Finished course of amoxicillin  after 5-day course of Rocephin .  Urine culture was not obtained last visit.  Endorses some mild congestion.   Denies blurry vision, vertigo, chest pain, shortness of breath, hematemesis, cough, melena, hematochezia, hematuria, lower leg swelling, rashes.  Prior to Admission medications  Medication Sig Start Date End Date Taking? Authorizing Provider  acetaminophen  (TYLENOL ) 650 MG CR tablet Take 650 mg by mouth 2 (two) times daily.    [provider]  acidophilus (RISAQUAD) CAPS capsule Take 2 capsules by mouth daily for 14 days. 03/27/24 04/10/24  Fausto Sor A, DO  amLODipine  (NORVASC ) 10 MG tablet Take 10 mg by mouth daily. 02/20/24   [provider]  aspirin  EC 81 MG tablet Take 1 tablet (81 mg total) by mouth at bedtime. 08/05/23   Dennise Lavada POUR, MD  atorvastatin  (LIPITOR ) 80 MG tablet Take 1 tablet (80 mg total) by mouth daily. 08/02/22   Henry Manuelita NOVAK, NP  baclofen  (LIORESAL ) 10 MG tablet Take 1 tablet (10 mg total) by mouth at  bedtime as needed for muscle spasms. 04/01/24   Williams, Megan E, NP  clopidogrel  (PLAVIX ) 75 MG tablet Take 1 tablet (75 mg total) by mouth daily. 08/05/23   Singh, Prashant K, MD  diclofenac  Sodium (VOLTAREN ) 1 % GEL Apply 2 g topically 4 (four) times daily. 03/26/24   Fausto Sor LABOR, DO  dorzolamide -timolol  (COSOPT ) 22.3-6.8 MG/ML ophthalmic solution Place 1 drop into both eyes 2 (two) times daily.    [provider]  feeding supplement (ENSURE PLUS HIGH PROTEIN) LIQD Take 237 mLs by mouth 2 (two) times daily between meals. 03/26/24   Fausto Sor LABOR, DO  fluticasone  (FLONASE ) 50 MCG/ACT nasal spray Place 2 sprays into both nostrils daily. 11/08/19   [provider]  hydrochlorothiazide  (HYDRODIURIL ) 12.5 MG tablet Take 12.5 mg by mouth daily. 09/15/22   [provider]  loperamide  (IMODIUM ) 2 MG capsule Take 1 capsule (2 mg total) by mouth as needed for diarrhea or loose stools. 03/26/24   Fausto Sor A, DO  metoprolol  tartrate (LOPRESSOR ) 25 MG tablet TAKE 1 TABLET BY MOUTH TWICE A DAY 11/07/23   Revankar, Rajan R, MD  Multiple Vitamins-Minerals (MULTIVITAMIN WOMEN 50+) TABS Take 1 tablet by mouth daily.    [provider]  nabumetone  (RELAFEN ) 750 MG tablet Take 750 mg by mouth 2 (two) times daily. 02/21/17   [provider]  nitroGLYCERIN  (NITROSTAT ) 0.4 MG SL tablet Place 0.4 mg under the tongue every 5 (five) minutes as needed for chest pain.    [provider]  ondansetron  (ZOFRAN -ODT) 4 MG disintegrating tablet Take 1 tablet (4  mg total) by mouth every 4 (four) hours as needed. 03/26/24   Fausto Burnard LABOR, DO  pantoprazole  (PROTONIX ) 40 MG tablet Take 1 tablet (40 mg total) by mouth daily. 08/06/23   Singh, Prashant K, MD  ROCKLATAN  0.02-0.005 % SOLN Apply 1 drop to eye at bedtime. 02/09/24   [provider]  valsartan  (DIOVAN ) 160 MG tablet Take 1 tablet (160 mg total) by mouth at bedtime. 08/07/23   Dennise Lavada POUR, MD   venlafaxine  XR (EFFEXOR -XR) 150 MG 24 hr capsule Take 1 capsule (150 mg total) by mouth daily with breakfast. 03/28/17   Erle Hails, DO    Allergies: Zestril  [lisinopril ]    Review of Systems  Gastrointestinal:  Positive for abdominal pain, diarrhea, nausea and vomiting.  Musculoskeletal:  Positive for back pain.  All other systems reviewed and are negative.   Updated Vital Signs BP (!) 171/70 (BP Location: Right Arm)   Pulse 71   Temp 97.6 F (36.4 C) (Oral)   Resp 16   SpO2 94%   Physical Exam Vitals and nursing note reviewed.  Constitutional:      General: She is not in acute distress.    Appearance: Normal appearance. She is not ill-appearing or diaphoretic.  HENT:     Head: Normocephalic and atraumatic.     Mouth/Throat:     Mouth: Mucous membranes are moist.     Pharynx: Oropharynx is clear. No oropharyngeal exudate or posterior oropharyngeal erythema.  Eyes:     General: No scleral icterus.       Right eye: No discharge.        Left eye: No discharge.     Extraocular Movements: Extraocular movements intact.     Conjunctiva/sclera: Conjunctivae normal.  Cardiovascular:     Rate and Rhythm: Normal rate and regular rhythm.     Pulses: Normal pulses.     Heart sounds: Normal heart sounds. No murmur heard.    No friction rub. No gallop.  Pulmonary:     Effort: Pulmonary effort is normal. No respiratory distress.     Breath sounds: No stridor. No wheezing, rhonchi or rales.  Chest:     Chest wall: No tenderness.  Abdominal:     General: Abdomen is flat. There is no distension.     Palpations: Abdomen is soft.     Tenderness: There is abdominal tenderness (Generalized abdominal tenderness noted to localize to the epigastric region.). There is right CVA tenderness. There is no left CVA tenderness, guarding or rebound.  Musculoskeletal:        General: No swelling, deformity or signs of injury.     Cervical back: Normal range of motion. No rigidity or  tenderness.     Right lower leg: No edema.     Left lower leg: No edema.  Skin:    General: Skin is warm and dry.     Findings: No bruising, erythema or lesion.  Neurological:     General: No focal deficit present.     Mental Status: She is alert and oriented to person, place, and time. Mental status is at baseline.     Sensory: No sensory deficit.     Motor: No weakness.     Comments: No facial asymmetry, no ataxia, no apraxia, no aphasia, no arm drift,  normal sensation to both upper and lower extremities bilaterally, normal grip strength bilaterally, normal strength to both flexion and extension to both upper lower extremities 5+ bilaterally, no visual field deficits, no nystagmus.  Psychiatric:        Mood and Affect: Mood normal.     (all labs ordered are listed, but only abnormal results are displayed) Labs Reviewed  COMPREHENSIVE METABOLIC PANEL WITH GFR - Abnormal; Notable for the following components:      Result Value   Glucose, Bld 198 (*)    Creatinine, Ser 1.09 (*)    GFR, Estimated 50 (*)    All other components within normal limits  CBC - Abnormal; Notable for the following components:   RBC 3.78 (*)    MCV 108.2 (*)    MCH 35.2 (*)    All other components within normal limits  URINALYSIS, ROUTINE W REFLEX MICROSCOPIC - Abnormal; Notable for the following components:   APPearance CLOUDY (*)    Leukocytes,Ua LARGE (*)    Bacteria, UA RARE (*)    Non Squamous Epithelial 0-5 (*)    All other components within normal limits  RESP PANEL BY RT-PCR (RSV, FLU A&B, COVID)  RVPGX2  URINE CULTURE  EXPECTORATED SPUTUM ASSESSMENT W GRAM STAIN, RFLX TO RESP C  CULTURE, BLOOD (ROUTINE X 2)  CULTURE, BLOOD (ROUTINE X 2)  C DIFFICILE QUICK SCREEN W PCR REFLEX    LIPASE, BLOOD  CBC  MAGNESIUM   PHOSPHORUS  PROTIME-INR  URINALYSIS, W/ REFLEX TO CULTURE (INFECTION SUSPECTED)  HEMOGLOBIN A1C    EKG: None  Radiology: CT ABDOMEN PELVIS W CONTRAST Result Date:  04/05/2024 CLINICAL DATA:  Epigastric pain. EXAM: CT ABDOMEN AND PELVIS WITH CONTRAST TECHNIQUE: Multidetector CT imaging of the abdomen and pelvis was performed using the standard protocol following bolus administration of intravenous contrast. RADIATION DOSE REDUCTION: This exam was performed according to the departmental dose-optimization program which includes automated exposure control, adjustment of the mA and/or kV according to patient size and/or use of iterative reconstruction technique. CONTRAST:  75mL OMNIPAQUE  IOHEXOL  350 MG/ML SOLN COMPARISON:  Fifteen days ago FINDINGS: Lower chest: No acute abnormality. Hepatobiliary: No focal liver abnormality is seen. No gallstones, gallbladder wall thickening, or biliary dilatation. Pancreas: Unremarkable. No pancreatic ductal dilatation or surrounding inflammatory changes. Spleen: Normal in size without focal abnormality. Adrenals/Urinary Tract: Adrenal glands appear normal. No hydronephrosis or renal obstruction is noted. Left renal cortical scarring is noted. No renal or ureteral calculi are noted. Bladder is unremarkable. There are persistent low density areas involving the right renal parenchyma on delayed post-contrast imaging suggesting some degree residual right-sided pyelonephritis. Stomach/Bowel: Stomach is unremarkable. There is no evidence of bowel obstruction or inflammation. Sigmoid diverticulosis without inflammation. Status post appendectomy. Vascular/Lymphatic: Aortic atherosclerosis. No enlarged abdominal or pelvic lymph nodes. Reproductive: Status post hysterectomy. No adnexal masses. Other: Stable round calcification seen in periumbilical region which may represent postsurgical change. No ascites is noted. Musculoskeletal: No acute or significant osseous findings. IMPRESSION: 1. Persistent low density areas are noted in right renal parenchyma on delayed post-contrast imaging suggesting some degree of residual right-sided pyelonephritis as noted  on prior exam 15 days ago. 2. Sigmoid diverticulosis without inflammation. 3. Aortic atherosclerosis. Aortic Atherosclerosis (ICD10-I70.0).  1 Electronically Signed   By: Lynwood Landy Raddle M.D.   On: 04/05/2024 09:59   MR LUMBAR SPINE WO CONTRAST Result Date: 04/05/2024 EXAM: MRI LUMBAR SPINE 04/05/2024 09:22:06 AM TECHNIQUE: Multiplanar multisequence MRI of the lumbar spine was performed without the administration of intravenous contrast. COMPARISON: MRI lumbar spine 07/17/2022. CLINICAL HISTORY: Low back pain, cauda equina syndrome suspected. FINDINGS: BONES AND ALIGNMENT: Conventional lumbosacral anatomy is assumed with 5 non-rib-bearing, lumbar-type vertebral bodies. Unchanged 4  mm retrolisthesis of L2 on L3. Normal vertebral body heights. Modic type 1 degenerative endplate marrow signal changes at L2-L3 and L3-L4. Unchanged postoperative appearance from prior L2-L3 laminectomy. SPINAL CORD: The conus terminates at L2. SOFT TISSUES: No paraspinal mass. L1-L2: Small disc bulge and mild bilateral facet arthropathy without spinal canal stenosis or neural foraminal narrowing. L2-L3: Prior laminectomy. No spinal canal stenosis. Disc bulge and facet arthropathy contribute to moderate right neural foraminal narrowing. L3-L4: Prior laminectomy. No spinal canal stenosis. Disc bulge and facet arthropathy contribute to severe left and moderate right neural foraminal narrowing. L4-L5: Disc bulge and moderate bilateral facet arthropathy result in moderate narrowing of the bilateral subarticular zones, right greater than left with mass effect on the traversing bilateral L5 nerve roots. Moderate right neural foraminal narrowing. L5-S1: Left foraminal disc protrusion and facet arthropathy contribute to moderate left neural foraminal narrowing. No spinal canal stenosis. IMPRESSION: 1. Unchanged postoperative appearance from prior L2-3 and L3-4 laminectomies. 2. At L4-5, moderate bilateral subarticular zone narrowing (right  greater than left) with mass effect on the traversing bilateral L5 nerve roots, and moderate right neural foraminal narrowing. 3. Multilevel neural foraminal narrowing, worst at L3-4 with severe left and moderate right narrowing. Electronically signed by: Ryan Chess MD 04/05/2024 09:31 AM EST RP Workstation: [id number]    Procedures   Medications Ordered in the ED  LORazepam  (ATIVAN ) injection 1 mg (has no administration in time range)  heparin  injection 5,000 Units (has no administration in time range)  sodium chloride  flush (NS) 0.9 % injection 3 mL (has no administration in time range)  0.9 %  sodium chloride  infusion (has no administration in time range)  sodium chloride  flush (NS) 0.9 % injection 3 mL (has no administration in time range)  acetaminophen  (TYLENOL ) tablet 650 mg (has no administration in time range)    Or  acetaminophen  (TYLENOL ) suppository 650 mg (has no administration in time range)  oxyCODONE  (Oxy IR/ROXICODONE ) immediate release tablet 5 mg (has no administration in time range)  HYDROmorphone  (DILAUDID ) injection 0.5-1 mg (has no administration in time range)  traZODone  (DESYREL ) tablet 25 mg (has no administration in time range)  ondansetron  (ZOFRAN ) tablet 4 mg (has no administration in time range)    Or  ondansetron  (ZOFRAN ) injection 4 mg (has no administration in time range)  ipratropium (ATROVENT ) nebulizer solution 0.5 mg (has no administration in time range)  hydrALAZINE  (APRESOLINE ) injection 10 mg (has no administration in time range)  lactobacillus (FLORANEX/LACTINEX) granules 1 g (has no administration in time range)  meropenem (MERREM) 1 g in sodium chloride  0.9 % 100 mL IVPB (has no administration in time range)  insulin  aspart (novoLOG ) injection 0-6 Units (has no administration in time range)  amLODipine  (NORVASC ) tablet 10 mg (has no administration in time range)  atorvastatin  (LIPITOR ) tablet 80 mg (has no administration in time range)   baclofen  (LIORESAL ) tablet 10 mg (has no administration in time range)  clopidogrel  (PLAVIX ) tablet 75 mg (has no administration in time range)  diclofenac  Sodium (VOLTAREN ) 1 % topical gel 2 g (has no administration in time range)  dorzolamide -timolol  (COSOPT ) 2-0.5 % ophthalmic solution 1 drop (has no administration in time range)  metoprolol  tartrate (LOPRESSOR ) tablet 25 mg (has no administration in time range)  Multivitamin Women 50+ TABS 1 tablet (has no administration in time range)  nitroGLYCERIN  (NITROSTAT ) SL tablet 0.4 mg (has no administration in time range)  irbesartan  (AVAPRO ) tablet 75 mg (has no administration in time range)  venlafaxine  XR (EFFEXOR -XR)  24 hr capsule 150 mg (has no administration in time range)  morphine  (PF) 4 MG/ML injection 4 mg (4 mg Intravenous Given 04/05/24 0848)  ondansetron  (ZOFRAN ) injection 4 mg (4 mg Intravenous Given 04/05/24 0848)  iohexol  (OMNIPAQUE ) 350 MG/ML injection 75 mL (75 mLs Intravenous Contrast Given 04/05/24 0928)  lactated ringers  bolus 1,000 mL (1,000 mLs Intravenous New Bag/Given 04/05/24 1011)    Medical Decision Making Amount and/or Complexity of Data Reviewed Labs: ordered.   This patient is a 83 year old female who presents to the ED for concern of presenting ED today with worsening bilateral flank pain, low back pain, generalized abdominal pain localized to epigastric region with nausea, vomiting for the last 3 days.  Additionally noting that she has had increased urinary and fecal incontinence noting approximately 6-8 episodes a day which is significantly increased from previous.  On physical exam, patient is in no acute distress, afebrile, alert and orient x 4, speaking in full sentences, nontachypneic, nontachycardic.  LCTAB.  Notably does have some right sided CVA tenderness as well as some mild abdominal tenderness localizing to the epigastric region.  Is able to have full sensation to lower extremities and has good  strength at hips, knees, ankles bilaterally, 5 out of 5.  However with patient's increased fecal and urinary incontinence, increased back pain having been already treated for UTI with recurrent dysuria, will order MRI and CT abdomen rule out cauda equina versus pyelonephritis.  MRI likely indicating radiculopathy as patient's cause of symptoms today with patient's pyelonephritis, more suspicious for pyelonephritis as cause of patient symptoms today having had recurrence, discussed with hospitalist, Dr. Willette who wished to put her on a Pentam, urine culture pending.  Patient care transferred to hospitalist at that time.  Differential diagnoses prior to evaluation: The emergent differential diagnosis includes, but is not limited to, pyelonephritis, cauda equina syndrome, multiple [place], diverticulitis, appendicitis, pancreatitis, cholecystitis. This is not an exhaustive differential.   Past Medical History / Co-morbidities / Social History: GERD, fibromyalgia, AAA, diabetes, lumbar stenosis, CAD, ACS, stroke, on Plavix  with no reported missed doses.  Additional history: Chart reviewed. Pertinent results include:   Last seen by orthopedics on 03/30/2024 for chronic bilateral low back pain, noted to be worsening and more severe and intermittently rating down both legs.  Mild relief with exercise.  Noted to have last MRI in 2023.  Has undergone epidural steroid injections and facet joint injections with minimal pain relief.  New lumbar MRI ordered.  Previously discharged on 03/26/2024 after being treated for pyelonephritis, hypomagnesia.  No cultural data was obtained, treated with IV antibiotics for 5 days.  Noted to have still intermittent flank pain at time of discharge but was improving.  Sent home on amoxicillin .  Lab Tests/Imaging studies: I personally interpreted labs/imaging and the pertinent results include: CBC unremarkable CMP notes increased creatinine and decreased GFR. Lipase  unremarkable X-ray pan unremarkable CT scan does show possible pyelonephritis to right side, MRI showing likely radicular symptoms with no active signs of cauda equina.  I agree with the radiologist interpretation.  Medications: I ordered medication including LR, morphine , Zofran .  I have reviewed the patients home medicines and have made adjustments as needed.  Critical Interventions: None  Social Determinants of Health: Lives at home with family  Disposition: After consideration of the diagnostic results and the patients response to treatment, I feel that the patient would benefit from admission, patient care transferred to Dr. Willette.   Final diagnoses:  Recurrent pyelonephritis  Nausea vomiting  and diarrhea    ED Discharge Orders     None          Beola Terrall GORMAN DEVONNA 04/05/24 1153    Towana Ozell BROCKS, MD 04/05/24 1743  "

## 2024-04-05 NOTE — ED Notes (Signed)
 Pt back to ed from [facility]

## 2024-04-05 NOTE — Assessment & Plan Note (Signed)
 Recurrent right-sided pyelonephritis Reporting of dysuria, incontinency, subjective fever, -UA positive for leukocyte esterase, bacteria rare, WBC >50 - Will follow-up with urine/blood cultures - Patient was recently treated with IV antibiotics of Rocephin , and amoxicillin  - Initiating broad-spectrum antibiotics of meropenem (Curbside ID -for recommendations)  CT abdomen/pelvis was reviewed: 1. Persistent low density areas are noted in right renal parenchyma on delayed post-contrast imaging suggesting some degree of residual right-sided pyelonephritis as noted on prior exam 15 days ago. 2. Sigmoid diverticulosis without inflammation.

## 2024-04-05 NOTE — H&P (Addendum)
 " History and Physical   Patient: Cassandra Brooks                            PCP: Erick Greig LABOR, NP                    DOB: [birth year]            DOA: 04/05/2024 FMW:[id number]             DOS: 04/05/2024, 12:03 PM  Moon, Amy A, NP  Patient coming from:   HOME  I have personally reviewed patient's medical records, in electronic medical records, including:  [place] [facility], and care everywhere.    Chief Complaint:   Chief Complaint  Patient presents with   Abdominal Pain    History of present illness:    Charlayne Vultaggio 83 year old female with history of HTN, HLD, CAD, degenerative disc disease... Intermittent vomiting, diarrhea.  Who was recently admitted and discharged 12/11 - 12/16 for pyelonephritis --- Presenting once again today for similar complaint, generalized abdominal pain, back pain, nonbilious or bloody vomiting, and diarrhea.  With bodyaches.  Subjective fever, chills. Acute on chronic back pain not associate with any lower extremity numbness or weakness.  Reporting of generalized weakness.  Chronic fecal and urinary incontinency.  She has completed her antibiotic course of amoxicillin  (received IV Rocephin  during hospital at home) Reporting of subjective fever and chills yesterday.   ED Evaluation: Blood pressure (!) 171/70, pulse 71, temperature 98.6 F (37 C), temperature source Oral, resp. rate 16, SpO2 94%. LABs:   CT abdomen/pelvis IMPRESSION: 1. Persistent low density areas are noted in right renal parenchyma on delayed post-contrast imaging suggesting some degree of residual right-sided pyelonephritis as noted on prior exam 15 days ago. 2. Sigmoid diverticulosis without inflammation. 3. Aortic atherosclerosis.   Patient Denies having: Fever, Chills, Cough, SOB, Chest Pain, Abd pain, N/V/D, headache, dizziness, lightheadedness,  Dysuria, Joint pain, rash, open wounds     Review of Systems: As per HPI, otherwise 10 point review of systems were  negative.   ----------------------------------------------------------------------------------------------------------------------  Allergies[1]  Home MEDs:  Prior to Admission medications  Medication Sig Start Date End Date Taking? Authorizing Provider  acetaminophen  (TYLENOL ) 650 MG CR tablet Take 650 mg by mouth 2 (two) times daily.    [provider]  acidophilus (RISAQUAD) CAPS capsule Take 2 capsules by mouth daily for 14 days. 03/27/24 04/10/24  Fausto Sor A, DO  amLODipine  (NORVASC ) 10 MG tablet Take 10 mg by mouth daily. 02/20/24   [provider]  aspirin  EC 81 MG tablet Take 1 tablet (81 mg total) by mouth at bedtime. 08/05/23   Dennise Lavada POUR, MD  atorvastatin  (LIPITOR ) 80 MG tablet Take 1 tablet (80 mg total) by mouth daily. 08/02/22   Henry Manuelita NOVAK, NP  baclofen  (LIORESAL ) 10 MG tablet Take 1 tablet (10 mg total) by mouth at bedtime as needed for muscle spasms. 04/01/24   Williams, Megan E, NP  clopidogrel  (PLAVIX ) 75 MG tablet Take 1 tablet (75 mg total) by mouth daily. 08/05/23   Singh, Prashant K, MD  diclofenac  Sodium (VOLTAREN ) 1 % GEL Apply 2 g topically 4 (four) times daily. 03/26/24   Fausto Sor LABOR, DO  dorzolamide -timolol  (COSOPT ) 22.3-6.8 MG/ML ophthalmic solution Place 1 drop into both eyes 2 (two) times daily.    [provider]  feeding supplement (ENSURE PLUS HIGH PROTEIN) LIQD Take 237 mLs by mouth  2 (two) times daily between meals. 03/26/24   Fausto Burnard LABOR, DO  fluticasone  (FLONASE ) 50 MCG/ACT nasal spray Place 2 sprays into both nostrils daily. 11/08/19   [provider]  hydrochlorothiazide  (HYDRODIURIL ) 12.5 MG tablet Take 12.5 mg by mouth daily. 09/15/22   [provider]  loperamide  (IMODIUM ) 2 MG capsule Take 1 capsule (2 mg total) by mouth as needed for diarrhea or loose stools. 03/26/24   Fausto Burnard A, DO  metoprolol  tartrate (LOPRESSOR ) 25 MG tablet TAKE 1 TABLET BY MOUTH TWICE A DAY 11/07/23    Revankar, Rajan R, MD  Multiple Vitamins-Minerals (MULTIVITAMIN WOMEN 50+) TABS Take 1 tablet by mouth daily.    [provider]  nabumetone  (RELAFEN ) 750 MG tablet Take 750 mg by mouth 2 (two) times daily. 02/21/17   [provider]  nitroGLYCERIN  (NITROSTAT ) 0.4 MG SL tablet Place 0.4 mg under the tongue every 5 (five) minutes as needed for chest pain.    [provider]  ondansetron  (ZOFRAN -ODT) 4 MG disintegrating tablet Take 1 tablet (4 mg total) by mouth every 4 (four) hours as needed. 03/26/24   Fausto Burnard LABOR, DO  pantoprazole  (PROTONIX ) 40 MG tablet Take 1 tablet (40 mg total) by mouth daily. 08/06/23   Singh, Prashant K, MD  ROCKLATAN  0.02-0.005 % SOLN Apply 1 drop to eye at bedtime. 02/09/24   [provider]  valsartan  (DIOVAN ) 160 MG tablet Take 1 tablet (160 mg total) by mouth at bedtime. 08/07/23   Dennise Lavada POUR, MD  venlafaxine  XR (EFFEXOR -XR) 150 MG 24 hr capsule Take 1 capsule (150 mg total) by mouth daily with breakfast. 03/28/17   Erle Hails, DO    PRN MEDs: acetaminophen  **OR** acetaminophen , baclofen , hydrALAZINE , HYDROmorphone  (DILAUDID ) injection, ipratropium, LORazepam , nitroGLYCERIN , ondansetron  **OR** ondansetron  (ZOFRAN ) IV, oxyCODONE , traZODone   Past Medical History:  Diagnosis Date   AAA (abdominal aortic aneurysm)    ACS (acute coronary syndrome) (HCC) 08/01/2022   Arthritis    knees (03/21/2017)   Borderline diabetes    Bulging lumbar disc    CAD (coronary artery disease) 08/17/2022   Depression    Diabetes mellitus due to underlying condition with unspecified complications (HCC) 12/19/2019   Disequilibrium    Dizziness    Essential hypertension 12/19/2019   Fall    Fibromyalgia    GERD (gastroesophageal reflux disease)    Glaucoma, both eyes    Hemoptysis 03/18/2015   Hyperlipidemia    don't have it but I take RX (03/21/2017)   Hypertension    Mixed dyslipidemia 12/19/2019   Mixed hyperlipidemia  04/18/2017   Nuclear cataract of both eyes 01/01/2014   Obesity (BMI 30.0-34.9) 09/29/2022   Plantar fasciitis 10/21/2020   Posterior tibial tendon dysfunction (PTTD) of right lower extremity 11/16/2021   Pre-diabetes    pt states she is not diabetic. Last A1C was normal. Pt is on no medications for DM   Preoperative cardiovascular examination 12/19/2019   Primary osteoarthritis of left foot 10/21/2020   Primary osteoarthritis of left knee 07/18/2019   Solitary pulmonary nodule 03/18/2015   03/2015 6mm RLL nodule    Spinal stenosis of lumbar region with neurogenic claudication 04/26/2021   Spinal stenosis, lumbar region with neurogenic claudication 04/26/2021   Central stenosis, L2-3 and L3-4 with moderate foramenal narrowing L4-5   Status post AAA (abdominal aortic aneurysm) repair 12/19/2019   Status post total left knee replacement 03/27/2020   STEMI (ST elevation myocardial infarction) (HCC) 08/01/2022   STEMI involving right coronary artery (HCC)  07/29/2022   Syncope 03/21/2017   Syncope and collapse    TIA (transient ischemic attack) 08/03/2023    Past Surgical History:  Procedure Laterality Date   ABDOMINAL AORTIC ANEURYSM REPAIR  1966   APPENDECTOMY  1966   CARDIAC CATHETERIZATION     20+ years ago, no abnormalities found   COLONOSCOPY  11/02/2016   Dr Towana. A few diverticuli were seen in the sigmoid colon. Otherwise normal colonoscopy to cecum   CORONARY/GRAFT ACUTE MI REVASCULARIZATION N/A 07/29/2022   Procedure: Coronary/Graft Acute MI Revascularization;  Surgeon: Burnard Debby LABOR, MD;  [facility]: [facility] INVASIVE CV LAB;  Service: Cardiovascular;  Laterality: N/A;   LEFT HEART CATH AND CORONARY ANGIOGRAPHY N/A 07/29/2022   Procedure: LEFT HEART CATH AND CORONARY ANGIOGRAPHY;  Surgeon: Burnard Debby LABOR, MD;  Location: [facility] INVASIVE CV LAB;  Service: Cardiovascular;  Laterality: N/A;   LUMBAR LAMINECTOMY/DECOMPRESSION MICRODISCECTOMY N/A 04/26/2021   Procedure: CENTRAL  LAMINECTOMIES LUMBAR TWO-THREE,  LUMBAR THREE-FOUR WITH BILATERAL FORAMINOTOMIES;  Surgeon: Lucilla Lynwood BRAVO, MD;  Location: [facility] OR;  Service: Orthopedics;  Laterality: N/A;   TOTAL KNEE ARTHROPLASTY Left 03/27/2020   Procedure: LEFT TOTAL KNEE ARTHROPLASTY;  Surgeon: Jerri Kay HERO, MD;  Location: [facility] OR;  Service: Orthopedics;  Laterality: Left;   VAGINAL HYSTERECTOMY     partial   VIDEO BRONCHOSCOPY Bilateral 03/19/2015   Procedure: VIDEO BRONCHOSCOPY WITHOUT FLUORO;  Surgeon: Harden Jude GAILS, MD;  Location: [facility] ENDOSCOPY;  Service: Cardiopulmonary;  Laterality: Bilateral;     reports that she has never smoked. She has never used smokeless tobacco. She reports that she does not drink alcohol and does not use drugs.   Family History  Problem Relation Age of Onset   Hypertension Father    Stomach cancer Father    Glaucoma Mother    Diabetes Mother    Diabetes Sister    Diabetes Brother    Lupus Child    Diabetes Brother    Lung cancer Brother    Colon cancer Brother     Physical Exam:   Vitals:   04/05/24 0741 04/05/24 0942 04/05/24 1143  BP: (!) 176/81 (!) 171/70   Pulse: 69 71   Resp: 19 16   Temp: 98.6 F (37 C)  97.6 F (36.4 C)  TempSrc: Oral  Oral  SpO2: 97% 94%    Constitutional: NAD, calm, comfortable Eyes: PERRL, lids and conjunctivae normal ENMT: Mucous membranes are moist. Posterior pharynx clear of any exudate or lesions.Normal dentition.  Neck: normal, supple, no masses, no thyromegaly Respiratory: clear to auscultation bilaterally, no wheezing, no crackles. Normal respiratory effort. No accessory muscle use.  Cardiovascular: Regular rate and rhythm, no murmurs / rubs / gallops. No extremity edema. 2+ pedal pulses. No carotid bruits.  Abdomen: no tenderness, no masses palpated. No hepatosplenomegaly. Bowel sounds positive.  Musculoskeletal: no clubbing / cyanosis. No joint deformity upper and lower extremities. Good ROM, no contractures. Normal muscle tone.   Neurologic: CN II-XII grossly intact. Sensation intact, DTR normal. Strength 5/5 in all 4.  Psychiatric: Normal judgment and insight. Alert and oriented x 3. Normal mood.  Skin: no rashes, lesions, ulcers. No induration      Labs on admission:    I have personally reviewed following labs and imaging studies  CBC: Recent Labs  Lab 04/05/24 0750  WBC 7.0  HGB 13.3  HCT 40.9  MCV 108.2*  PLT 251   Basic Metabolic Panel: Recent Labs  Lab 04/05/24 0750  NA 138  K 3.9  CL 103  CO2 22  GLUCOSE 198*  BUN 21  CREATININE 1.09*  CALCIUM  9.3   GFR: Estimated Creatinine Clearance: 36.1 mL/min (A) (by C-G formula based on SCr of 1.09 mg/dL (H)). Liver Function Tests: Recent Labs  Lab 04/05/24 0750  AST 26  ALT 21  ALKPHOS 89  BILITOT 0.4  PROT 7.1  ALBUMIN 3.9   Recent Labs  Lab 04/05/24 0750  LIPASE 19    Urine analysis:    Component Value Date/Time   COLORURINE YELLOW 04/05/2024 0836   APPEARANCEUR CLOUDY (A) 04/05/2024 0836   LABSPEC 1.016 04/05/2024 0836   PHURINE 5.0 04/05/2024 0836   GLUCOSEU NEGATIVE 04/05/2024 0836   HGBUR NEGATIVE 04/05/2024 0836   BILIRUBINUR NEGATIVE 04/05/2024 0836   KETONESUR NEGATIVE 04/05/2024 0836   PROTEINUR NEGATIVE 04/05/2024 0836   NITRITE NEGATIVE 04/05/2024 0836   LEUKOCYTESUR LARGE (A) 04/05/2024 0836    Last A1C:  Lab Results  Component Value Date   HGBA1C 6.1 (H) 08/04/2023     Radiologic Exams on Admission:   CT ABDOMEN PELVIS W CONTRAST Result Date: 04/05/2024 CLINICAL DATA:  Epigastric pain. EXAM: CT ABDOMEN AND PELVIS WITH CONTRAST TECHNIQUE: Multidetector CT imaging of the abdomen and pelvis was performed using the standard protocol following bolus administration of intravenous contrast. RADIATION DOSE REDUCTION: This exam was performed according to the departmental dose-optimization program which includes automated exposure control, adjustment of the mA and/or kV according to patient size and/or use of  iterative reconstruction technique. CONTRAST:  75mL OMNIPAQUE  IOHEXOL  350 MG/ML SOLN COMPARISON:  Fifteen days ago FINDINGS: Lower chest: No acute abnormality. Hepatobiliary: No focal liver abnormality is seen. No gallstones, gallbladder wall thickening, or biliary dilatation. Pancreas: Unremarkable. No pancreatic ductal dilatation or surrounding inflammatory changes. Spleen: Normal in size without focal abnormality. Adrenals/Urinary Tract: Adrenal glands appear normal. No hydronephrosis or renal obstruction is noted. Left renal cortical scarring is noted. No renal or ureteral calculi are noted. Bladder is unremarkable. There are persistent low density areas involving the right renal parenchyma on delayed post-contrast imaging suggesting some degree residual right-sided pyelonephritis. Stomach/Bowel: Stomach is unremarkable. There is no evidence of bowel obstruction or inflammation. Sigmoid diverticulosis without inflammation. Status post appendectomy. Vascular/Lymphatic: Aortic atherosclerosis. No enlarged abdominal or pelvic lymph nodes. Reproductive: Status post hysterectomy. No adnexal masses. Other: Stable round calcification seen in periumbilical region which may represent postsurgical change. No ascites is noted. Musculoskeletal: No acute or significant osseous findings. IMPRESSION: 1. Persistent low density areas are noted in right renal parenchyma on delayed post-contrast imaging suggesting some degree of residual right-sided pyelonephritis as noted on prior exam 15 days ago. 2. Sigmoid diverticulosis without inflammation. 3. Aortic atherosclerosis. Aortic Atherosclerosis (ICD10-I70.0).  1 Electronically Signed   By: Lynwood Landy Raddle M.D.   On: 04/05/2024 09:59   MR LUMBAR SPINE WO CONTRAST Result Date: 04/05/2024 EXAM: MRI LUMBAR SPINE 04/05/2024 09:22:06 AM TECHNIQUE: Multiplanar multisequence MRI of the lumbar spine was performed without the administration of intravenous contrast. COMPARISON: MRI  lumbar spine 07/17/2022. CLINICAL HISTORY: Low back pain, cauda equina syndrome suspected. FINDINGS: BONES AND ALIGNMENT: Conventional lumbosacral anatomy is assumed with 5 non-rib-bearing, lumbar-type vertebral bodies. Unchanged 4 mm retrolisthesis of L2 on L3. Normal vertebral body heights. Modic type 1 degenerative endplate marrow signal changes at L2-L3 and L3-L4. Unchanged postoperative appearance from prior L2-L3 laminectomy. SPINAL CORD: The conus terminates at L2. SOFT TISSUES: No paraspinal mass. L1-L2: Small disc bulge and mild bilateral facet arthropathy without spinal canal stenosis or neural foraminal  narrowing. L2-L3: Prior laminectomy. No spinal canal stenosis. Disc bulge and facet arthropathy contribute to moderate right neural foraminal narrowing. L3-L4: Prior laminectomy. No spinal canal stenosis. Disc bulge and facet arthropathy contribute to severe left and moderate right neural foraminal narrowing. L4-L5: Disc bulge and moderate bilateral facet arthropathy result in moderate narrowing of the bilateral subarticular zones, right greater than left with mass effect on the traversing bilateral L5 nerve roots. Moderate right neural foraminal narrowing. L5-S1: Left foraminal disc protrusion and facet arthropathy contribute to moderate left neural foraminal narrowing. No spinal canal stenosis. IMPRESSION: 1. Unchanged postoperative appearance from prior L2-3 and L3-4 laminectomies. 2. At L4-5, moderate bilateral subarticular zone narrowing (right greater than left) with mass effect on the traversing bilateral L5 nerve roots, and moderate right neural foraminal narrowing. 3. Multilevel neural foraminal narrowing, worst at L3-4 with severe left and moderate right narrowing. Electronically signed by: Ryan Chess MD 04/05/2024 09:31 AM EST RP Workstation: [id number]    EKG:   Independently reviewed.  Orders placed or performed during the hospital encounter of 04/05/24   EKG 12-Lead   EKG 12-Lead    EKG 12-Lead   ---------------------------------------------------------------------------------------------------------------------------------------    Assessment / Plan:   Principal Problem:   Pyelonephritis Active Problems:   GERD (gastroesophageal reflux disease)   Essential hypertension   Diabetes mellitus due to underlying condition with unspecified complications (HCC)   Spinal stenosis of lumbar region with neurogenic claudication   Mixed hyperlipidemia   CAD (coronary artery disease)   Diarrhea   Stroke (cerebrum) (HCC)   Assessment and Plan: * Pyelonephritis Recurrent right-sided pyelonephritis Reporting of dysuria, incontinency, subjective fever, -UA positive for leukocyte esterase, bacteria rare, WBC >50 - Will follow-up with urine/blood cultures - Patient was recently treated with IV antibiotics of Rocephin , and amoxicillin  - Initiating broad-spectrum antibiotics of meropenem (Curbside ID -for recommendations)  CT abdomen/pelvis was reviewed: 1. Persistent low density areas are noted in right renal parenchyma on delayed post-contrast imaging suggesting some degree of residual right-sided pyelonephritis as noted on prior exam 15 days ago. 2. Sigmoid diverticulosis without inflammation.     Essential hypertension - Blood pressure elevated -Resuming home BP meds -Along with the as needed IV hydralazine   GERD (gastroesophageal reflux disease) As needed Maalox -Withholding PPI till C. Difficile is ruled out  Stroke (cerebrum) [facility]) History of CVA in April 2025 Per neurology note patient is to continue statins, was on aspirin  Plavix  to continue with Plavix   Diarrhea - Acute on chronic diarrhea -Likely exacerbated by recent antibiotics -Chronic stool incontinence he -Ruling out C. difficile colitis-pending stool studies -May start empiric Flagyl  (needs antibiotics for recurrent pyelonephritis-curbside and ID) - Continue lactobacillus  CAD  (coronary artery disease) - Stable, denies any chest pain -Reviewing home medication, continue Plavix , statins,  Mixed hyperlipidemia Continue statins  Spinal stenosis of lumbar region with neurogenic claudication Complaining of back pain - Generalized weaknesses with no paresthesia  MRI - Unchanged postoperative appearance from prior L2-3 and L3-4 laminectomies. 2. At L4-5, moderate bilateral subarticular zone narrowing (right greater than left) with mass effect on the traversing bilateral L5 nerve roots, and moderate right neural foraminal narrowing. 3. Multilevel neural foraminal narrowing, worst at L3-4 with severe left and moderate right narrowing.  Still complaining of progressive back pain not associated with paresthesia, bilateral lower extremity and  generalized weaknesses along with bowel and urine incontinence, which may be contributing factors  Discussed with the case with neurosurgery Dr. Malcolm (curbside consult) Reviewed MRI-recommended no intervention at  this point, may use NSAIDs and steroids to reduce inflammation -Will initiate short burst of Decadron  -Follow-up as an outpatient  -Continue home regimen including baclofen  - As needed analgesics, counseling PT OT for evaluation recommendations   Diabetes mellitus due to underlying condition with unspecified complications (HCC) Prediabetic with last hemoglobin A1c 6.1 -Mild hyperglycemia -Checking CBG q. ACHS with SSI coverage     Consults called:  none  (Curb sided neurosurgery/ID)  -------------------------------------------------------------------------------------------------------------------------------------------- DVT prophylaxis:  heparin  injection 5,000 Units Start: 04/05/24 1400 SCDs Start: 04/05/24 1104   Code Status:   Code Status: Full Code   Admission status: Patient will be admitted as Inpatient, with a greater than 2 midnight length of stay. Level of care: Med-Surg   Family  Communication: Discussed with her husband at bedside (The above findings and plan of care has been discussed with patient in detail, the patient expressed understanding and agreement of above plan)  --------------------------------------------------------------------------------------------------------------------------------------------------  Disposition Plan:  Anticipated 1-2 days Status is: Inpatient Remains inpatient appropriate because: Needing IV antibiotics, IV fluids,     ----------------------------------------------------------------------------------------------------------------------------------------------------  Time spent:  4  Min.  Was spent seeing and evaluating the patient, reviewing all medical records, drawn plan of care.  SIGNED: Adriana DELENA Grams, MD, FHM. FAAFP. [facility], Pager  (Please use [email or web address] to page/ or secure chat through epic) If 7PM-7AM, please contact night-coverage [email or web address],  04/05/2024, 12:03 PM  For on call review [email or web address].      [1]  Allergies Allergen Reactions   Zestril  [Lisinopril ] Cough   "

## 2024-04-05 NOTE — Progress Notes (Signed)
" ° ° °  EXPEDITER LEVEL LOADING ASSESSMENT NOTE  Patient Name: Cassandra Brooks  DOB:11-02-1940 Date of Admission: 04/05/2024  Date of Assessment:04/05/2024   -------------------------------------------------------------------------------------------------------------------   Brief clinical summary: 83 year old female who presented to the ED with c/o generalized abdominal pain, back pain, vomiting, diarrhea, and bodyaches.    Is there Bed Availability at another [facility]? Yes  If yes, what facility: [facility]  Level of Care Needed:  Yes  MD Agree to transfer: N/A  Patient agrees to transfer: No    -------------------------------------------------------------------------------------------------------------------  [facility] RN Expediter, Kymorah Korf S Nea Gittens Please contact us  directly via secure chat (search for [facility]) or by calling us  at [phone] [facility]). "

## 2024-04-05 NOTE — Assessment & Plan Note (Addendum)
 Complaining of back pain - Generalized weaknesses with no paresthesia  MRI - Unchanged postoperative appearance from prior L2-3 and L3-4 laminectomies. 2. At L4-5, moderate bilateral subarticular zone narrowing (right greater than left) with mass effect on the traversing bilateral L5 nerve roots, and moderate right neural foraminal narrowing. 3. Multilevel neural foraminal narrowing, worst at L3-4 with severe left and moderate right narrowing.  Still complaining of progressive back pain not associated with paresthesia, bilateral lower extremity and  generalized weaknesses along with bowel and urine incontinence, which may be contributing factors  Discussed with the case with neurosurgery Dr. Malcolm (curbside consult) Reviewed MRI-recommended no intervention at this point, may use NSAIDs and steroids to reduce inflammation -Will initiate short burst of Decadron  -Follow-up as an outpatient  -Continue home regimen including baclofen  - As needed analgesics, counseling PT OT for evaluation recommendations

## 2024-04-05 NOTE — Assessment & Plan Note (Signed)
 As needed Maalox -Withholding PPI till C. Difficile is ruled out

## 2024-04-05 NOTE — Assessment & Plan Note (Addendum)
-   Stable, denies any chest pain -Reviewing home medication, continue Plavix , statins,

## 2024-04-05 NOTE — Assessment & Plan Note (Signed)
 Prediabetic with last hemoglobin A1c 6.1 -Mild hyperglycemia -Checking CBG q. ACHS with SSI coverage

## 2024-04-05 NOTE — Telephone Encounter (Signed)
 She finished her amoxicillin  on Sunday for a UTI as [facility] patient. She is still vomiting, burning urination. Advised to come to the ED>

## 2024-04-05 NOTE — ED Notes (Signed)
 Patient transported to MRI

## 2024-04-05 NOTE — Assessment & Plan Note (Signed)
-   Blood pressure elevated -Resuming home BP meds -Along with the as needed IV hydralazine

## 2024-04-05 NOTE — Hospital Course (Addendum)
 83 year old female CAD cath 07/29/2022 total occlusion small posterior descending branch RCA previously aspirin  Brilinta -with complication of pain in the chest likely GI related Fibromyalgia Depression HTN HLD Known history of chronic fecal incontinence?  Overflow followed by Dr. Charlanne previously on FiberCon and Imodium    Admit 12/11-12/16 2025 progressive LBP nausea vomiting-CT ABD pelvis = right-sided pyelo-Rx Rocephin  2 g daily-diarrhea during hospital stay thought to be noninfectious patient discharged home on Amoxil -- 12/27 came in similar complaint nonbilious nonbloody diarrhea subjective fever chills CT abdomen pelvis residual right-sided pyelo blood culture X2 ordered 12/26, urine culture pending E. coli noted C. difficile also positive

## 2024-04-05 NOTE — Assessment & Plan Note (Signed)
 History of CVA in April 2025 Per neurology note patient is to continue statins, was on aspirin  Plavix  to continue with Plavix 

## 2024-04-06 DIAGNOSIS — N12 Tubulo-interstitial nephritis, not specified as acute or chronic: Secondary | ICD-10-CM | POA: Diagnosis not present

## 2024-04-06 LAB — COMPREHENSIVE METABOLIC PANEL WITH GFR
ALT: 17 U/L (ref 0–44)
AST: 21 U/L (ref 15–41)
Albumin: 3.3 g/dL — ABNORMAL LOW (ref 3.5–5.0)
Alkaline Phosphatase: 66 U/L (ref 38–126)
Anion gap: 11 (ref 5–15)
BUN: 24 mg/dL — ABNORMAL HIGH (ref 8–23)
CO2: 22 mmol/L (ref 22–32)
Calcium: 8.6 mg/dL — ABNORMAL LOW (ref 8.9–10.3)
Chloride: 103 mmol/L (ref 98–111)
Creatinine, Ser: 1.06 mg/dL — ABNORMAL HIGH (ref 0.44–1.00)
GFR, Estimated: 52 mL/min — ABNORMAL LOW
Glucose, Bld: 174 mg/dL — ABNORMAL HIGH (ref 70–99)
Potassium: 4.4 mmol/L (ref 3.5–5.1)
Sodium: 136 mmol/L (ref 135–145)
Total Bilirubin: 0.2 mg/dL (ref 0.0–1.2)
Total Protein: 6.2 g/dL — ABNORMAL LOW (ref 6.5–8.1)

## 2024-04-06 LAB — CBC
HCT: 34.6 % — ABNORMAL LOW (ref 36.0–46.0)
Hemoglobin: 11.3 g/dL — ABNORMAL LOW (ref 12.0–15.0)
MCH: 34.7 pg — ABNORMAL HIGH (ref 26.0–34.0)
MCHC: 32.7 g/dL (ref 30.0–36.0)
MCV: 106.1 fL — ABNORMAL HIGH (ref 80.0–100.0)
Platelets: 223 K/uL (ref 150–400)
RBC: 3.26 MIL/uL — ABNORMAL LOW (ref 3.87–5.11)
RDW: 13.3 % (ref 11.5–15.5)
WBC: 8.2 K/uL (ref 4.0–10.5)
nRBC: 0 % (ref 0.0–0.2)

## 2024-04-06 LAB — GLUCOSE, CAPILLARY
Glucose-Capillary: 205 mg/dL — ABNORMAL HIGH (ref 70–99)
Glucose-Capillary: 153 mg/dL — ABNORMAL HIGH (ref 70–99)
Glucose-Capillary: 193 mg/dL — ABNORMAL HIGH (ref 70–99)
Glucose-Capillary: 196 mg/dL — ABNORMAL HIGH (ref 70–99)
Glucose-Capillary: 163 mg/dL — ABNORMAL HIGH (ref 70–99)

## 2024-04-06 MED ORDER — ENSURE PLUS HIGH PROTEIN PO LIQD
237.0000 mL | Freq: Two times a day (BID) | ORAL | Status: DC
  Administered 2024-04-06 – 2024-04-11 (×9): 237 mL via ORAL

## 2024-04-06 MED ORDER — DICLOFENAC EPOLAMINE 1.3 % EX PTCH
1.0000 | MEDICATED_PATCH | Freq: Two times a day (BID) | CUTANEOUS | Status: DC
  Administered 2024-04-06 – 2024-04-11 (×10): 1 via TRANSDERMAL
  Filled 2024-04-06 (×3): qty 1

## 2024-04-06 MED ORDER — SODIUM CHLORIDE 0.9 % IV SOLN: INTRAVENOUS | Status: AC

## 2024-04-06 MED ORDER — FLUTICASONE PROPIONATE 50 MCG/ACT NA SUSP
2.0000 | Freq: Every day | NASAL | Status: DC
  Administered 2024-04-06 – 2024-04-11 (×6): 2 via NASAL
  Filled 2024-04-06: qty 16

## 2024-04-06 MED ORDER — VANCOMYCIN HCL 125 MG PO CAPS
125.0000 mg | ORAL_CAPSULE | Freq: Three times a day (TID) | ORAL | Status: DC
  Administered 2024-04-06 – 2024-04-07 (×5): 125 mg via ORAL
  Filled 2024-04-06 (×7): qty 1

## 2024-04-06 NOTE — Progress Notes (Signed)
 [facility]  Adylene Dlugosz FMW:[id number]  DOB: [birth year]  DOA: 04/05/2024  PCP: Erick Greig LABOR, NP  04/06/2024,2:34 PM  LOS: 1 day    Code Status: Full code     from: Home   83 year old female CAD cath 07/29/2022 total occlusion small posterior descending branch RCA previously aspirin  Brilinta -with complication of pain in the chest likely GI related Fibromyalgia Depression HTN HLD Known history of chronic fecal incontinence?  Overflow followed by Dr. Charlanne previously on FiberCon and Imodium   Admit 12/11-12/16 2025 progressive LBP nausea vomiting-CT ABD pelvis = right-sided pyelo-Rx Rocephin  2 g daily-diarrhea during hospital stay thought to be noninfectious patient discharged home on Amoxil -- 12/27 came in similar complaint nonbilious nonbloody diarrhea subjective fever chills CT abdomen pelvis residual right-sided pyelo blood culture X2 ordered 12/26, urine culture pending E. coli noted C. difficile also positive   Assessment  & Plan :    C. difficile colitis 2-3 stools not watery but still loose and has abdominal pain-continue NS 100 cc/8,   E. coli lower UTI continue ceftriaxone  2 g every 24 and follow culture-recurrence is worrisome-- ? blood cultures are positive will need extended treatment and discussion with urology Probably has cholecystitis Follow-up blood culture  CAD cath 2024 RCA stent Current medications include metoprolol  25 twice daily a VaPro 75 amlodipine  10 atorvastatin  80 Have held aspirin /Plavix  can resume later during hospital stay Holding HCTZ  Macrocytic anemia B12 2024 folic acid >20 on 03/23/2024 Needs outpatient workup-could have congenital/hereditary disease-given her age would also consider some element of myelodysplasia She has had macrocytosis since 2015  Prediabetes -A1c 6.3 Not on meds  spinal stenosis I will discontinue rapidly Decadron  and placed on Flector  patch and consider nonsteroidals orally if she is okay in the next 24 hours   Data Reviewed  today: BUN/creatinine 24/1.0 LFTs normal--WBC 8.2 hemoglobin 11.3 platelet 223  DVT prophylaxis: SCD   Dispo/Global plan: Could possibly discharge to hospital at home if she is not having copious diarrhea I had a good discussion with the patient's daughter [name] and she explains that patient was having nausea vomiting and diarrhea and having antimotility in both directions-she lives with her husband at home so patient would need to be somewhat continent to be able to go home     Subjective:   Awake coherent pleasant no distress 2-3 loose stools today [place] 4-6-moderate abdominal pain No fever no chills  Objective + exam Vitals:   04/05/24 2207 04/06/24 0520 04/06/24 0808 04/06/24 0851  BP: 126/78 130/69 (!) 159/64 (!) 159/64  Pulse: 94 93 88 88  Resp: 19 17 19    Temp: 98.3 F (36.8 C) 98.1 F (36.7 C) 98 F (36.7 C)   TempSrc: Oral Oral    SpO2: 92% 96% 99%   Weight:  74.6 kg    Height:       Filed Weights   04/05/24 1500 04/06/24 0520  Weight: 74.5 kg 74.6 kg     Examination: EOMI NCAT no focal deficit no icterus no pallor neck soft supple-S1-S2 no murmur no rub no gallops--CTAB no rales rhonchi--ABD soft NT ND--neuro intact power 5/5 moving 4 limbs equally   Scheduled Meds:  acidophilus  1 capsule Oral TID WC   amLODipine   10 mg Oral Daily   atorvastatin   80 mg Oral Daily   clopidogrel   75 mg Oral Daily   dexamethasone  (DECADRON ) injection  4 mg Intravenous Q12H   diclofenac  Sodium  2 g Topical QID   dorzolamide -timolol   1 drop Both  Eyes BID   heparin   5,000 Units Subcutaneous Q8H   insulin  aspart  0-6 Units Subcutaneous TID WC   irbesartan   75 mg Oral Daily   metoprolol  tartrate  25 mg Oral BID   multivitamin with minerals  1 tablet Oral Daily   sodium chloride  flush  3 mL Intravenous Q12H   sodium chloride  flush  3 mL Intravenous Q12H   vancomycin   125 mg Oral TID AC & HS   venlafaxine  XR  150 mg Oral Q breakfast   Continuous Infusions:  cefTRIAXone   (ROCEPHIN )  IV 2 g (04/06/24 1325)   acetaminophen  **OR** acetaminophen , baclofen , hydrALAZINE , HYDROmorphone  (DILAUDID ) injection, ipratropium, LORazepam , nitroGLYCERIN , ondansetron  **OR** ondansetron  (ZOFRAN ) IV, oxyCODONE , traZODone   Time 47  Colen Grimes, MD  [facility]

## 2024-04-06 NOTE — Evaluation (Signed)
 Occupational Therapy Evaluation Patient Details Name: Cassandra Brooks MRN: [id number] DOB: [birth year] Today's Date: 04/06/2024   History of Present Illness   Pt is an 83 yo female who presents to [facility] 12/26 with c/o worsening back pain, vomiting, diarrhea, and body aches. Admitted for further workup with CT on 12/26 suggesting some degree of residual Right-sided pyelonephritis. Of note - pt with recent admission 12/11 to 12/16 for pyelonephritis. PMH includes CAD, HTN, HLD, TIA, AAA, STEMI, syncope, L TKA, lumbar spinal stenosis, DDD, fibromyalgia, glaucoma, depression, chronic fecal and urinary incontinency     Clinical Impressions At baseline, pt is Mod I with ADLs, most IADLs, and functional mobility with furniture surfing in the home or use of SPC in the community. Family assists with transportation. Pt now presents near baseline PLOF, but with decreased activity tolerance and mildly decreased balance with no overt LOB during session. Pt currently completing ADLs Mod I to Supervision and functional mobility with Supervision for safety using IV pole at [facility] during ambulation this session. OT initiated education in energy conservation strategies and pt will benefit from reinforcement of education. Pt VSS on RA throughout session. Pt will benefit from acute skilled OT services to increase activity tolerance and safety and independence with functional tasks. Pt has 24/7 assist available in the home and no post acute skilled OT needs are anticipated.      If plan is discharge home, recommend the following:   A little help with bathing/dressing/bathroom;Assistance with cooking/housework;Help with stairs or ramp for entrance;Assist for transportation     Functional Status Assessment   Patient has had a recent decline in their functional status and demonstrates the ability to make significant improvements in function in a reasonable and predictable amount of time.     Equipment  Recommendations   None recommended by OT     Recommendations for Other Services         Precautions/Restrictions   Precautions Precautions: Fall;Other (comment) Recall of Precautions/Restrictions: Intact Precaution/Restrictions Comments: chronic fecal and urinary urgency/incontinency Restrictions Weight Bearing Restrictions Per Provider Order: No     Mobility Bed Mobility Overal bed mobility: Modified Independent             General bed mobility comments: HOB elevated    Transfers Overall transfer level: Modified independent Equipment used: None                      Balance Overall balance assessment: Mild deficits observed, not formally tested                                         ADL either performed or assessed with clinical judgement   ADL Overall ADL's : Modified independent                                       General ADL Comments: Pt largely Mod I with ADLs, but recommend Supervision for bathing and functional/mobility/transfersat this time for safety and assistance as needed for IADLs secodnary to decreased activity tolerance. Initiated education regarding energy conservation strategies. Pt will benefit from reinforcement of education.     Vision Baseline Vision/History: 3 Glaucoma;1 Wears glasses Ability to See in Adequate Light: 0 Adequate (with glasses) Patient Visual Report: No change from baseline Additional Comments: Vision [facility] for tasks assessed  Perception         Praxis         Pertinent Vitals/Pain Pain Assessment Pain Assessment: Faces Faces Pain Scale: Hurts little more Pain Location: abdomen; lower back Pain Descriptors / Indicators: Discomfort, Grimacing, Sore Pain Intervention(s): Limited activity within patient's tolerance, Monitored during session, Repositioned, Premedicated before session     Extremity/Trunk Assessment Upper Extremity Assessment Upper Extremity  Assessment: Defer to OT evaluation LUE Deficits / Details: strength, ROM, sensation WFL; mildly decreased fine motor coordination, but able to use functionally LUE Coordination: decreased fine motor (mild, WFL)   Lower Extremity Assessment Lower Extremity Assessment: Overall WFL for tasks assessed   Cervical / Trunk Assessment Cervical / Trunk Assessment: Normal   Communication Communication Communication: Impaired Factors Affecting Communication: Difficulty expressing self (some expressive aphasia; pt reports word finding/speech difficulties since stroke)   Cognition Arousal: Alert Behavior During Therapy: WFL for tasks assessed/performed Cognition: No apparent impairments             OT - Cognition Comments: Pt AAOx4 and pleasant throughout session with cognition WFL for tasks assessed; not fomrally screened or evaluated                 Following commands: Intact       Cueing  General Comments   Cueing Techniques: Verbal cues;Visual cues  VSS on RA   Exercises     Shoulder Instructions      Home Living Family/patient expects to be discharged to:: Private residence Living Arrangements: Spouse/significant other;Children Available Help at Discharge: Family;Neighbor;Available 24 hours/day Type of Home: House Home Access: Stairs to enter;Ramped entrance Entrance Stairs-Number of Steps: 2 Entrance Stairs-Rails: Left Home Layout: One level     Bathroom Shower/Tub: Producer, Television/film/video: Handicapped height     Home Equipment: [employer]ant (2 wheels);Cane - quad;Cane - single point;BSC/3in1;Grab bars - tub/shower;Wheelchair - manual   Additional Comments: BSC over toilet. place a chair in their shower. lives with husband who uses RW since recent stroke      Prior Functioning/Environment Prior Level of Function : Independent/Modified Independent;History of Falls (last six months)             Mobility Comments: Furniture surfs in the  home. SPC for community mobility. ADLs Comments: Mod I with ADLs most IADLs; receives assistance for transportation; Daughter also provides PRN assist for other IADLs; pt enjoys growing a garden, pharmacist, community from trees in her yard, and spending time with family    OT Problem List: Decreased activity tolerance;Impaired balance (sitting and/or standing)   OT Treatment/Interventions: Self-care/ADL training;Energy conservation;Patient/family education      OT Goals(Current goals can be found in the care plan section)   Acute Rehab OT Goals Patient Stated Goal: to return hom and stay active OT Goal Formulation: With patient Time For Goal Achievement: 04/20/24 Potential to Achieve Goals: Good ADL Goals Pt Will Transfer to Toilet: with modified independence;ambulating;regular height toilet (with least restrictive AD) Additional ADL Goal #1: Patient will demonstrate understanding through teach back of education in energy conservation strategies with handout provided for increased safety and independence with functional tasks. Additional ADL Goal #2: Patient will demonstrate ability to participate in 5 minutes of a functional or therapeutic activity prior to needing a rest break to increase safety and independence with functional tasks.   OT Frequency:  Min 1X/week    Co-evaluation              AM-PAC OT 6 Clicks Daily  Activity     Outcome Measure Help from another person eating meals?: None Help from another person taking care of personal grooming?: None Help from another person toileting, which includes using toliet, bedpan, or urinal?: None Help from another person bathing (including washing, rinsing, drying)?: A Little Help from another person to put on and taking off regular upper body clothing?: None Help from another person to put on and taking off regular lower body clothing?: None 6 Click Score: 23   End of Session Equipment Utilized During Treatment: Gait  belt Nurse Communication: Mobility status;Other (comment) (OT POC)  Activity Tolerance: Patient tolerated treatment well Patient left: in bed;with call bell/phone within reach;with bed alarm set  OT Visit Diagnosis: Other (comment) (decreaed activity tolerance)                Time: 8750-8685 OT Time Calculation (min): 25 min Charges:  OT General Charges $OT Visit: 1 Visit OT Evaluation $OT Eval Low Complexity: 1 Low  Margarie Rockey HERO., OTR/L, MA Acute Rehab [phone]   Margarie FORBES Horns 04/06/2024, 6:00 PM

## 2024-04-06 NOTE — Evaluation (Addendum)
 Physical Therapy Evaluation Patient Details Name: Cassandra Brooks MRN: [id number] DOB: 06/19/1940 Today's Date: 04/06/2024  History of Present Illness  Pt is an 83 yo female who presents to [facility] 12/26 with c/o worsening back pain, vomiting, diarrhea, and body aches. Admitted for further workup with CT on 12/26 suggesting some degree of residual Right-sided pyelonephritis. Of note - pt with recent admission 12/11 to 12/16 for pyelonephritis. PMH includes CAD, HTN, HLD, TIA, AAA, STEMI, syncope, L TKA, lumbar spinal stenosis, DDD, fibromyalgia, glaucoma, depression, chronic fecal and urinary incontinency   Clinical Impression  PTA pt would furniture surf in the home with use of SP cane in the community. Pt presents close to mobility baseline with chronic back pain, decreased activity tolerance, and impaired balance/gait pattern. Pt was ModI for bed mobility and to stand with no AD. Able to ambulate 182ft with supervision. After ~44ft, pt became fatigued and started to reach for UE support with pt then using IV pole as SP cane. Pt has 24/7 assist available upon d/c home. Discussed benefits of post-acute rehab with recommendation for OP PT to address back pain and mobility deficits. Acute PT to follow.         If plan is discharge home, recommend the following: Assistance with cooking/housework;Assist for transportation;Help with stairs or ramp for entrance   Can travel by private vehicle    Yes    Equipment Recommendations None recommended by PT     Functional Status Assessment Patient has had a recent decline in their functional status and demonstrates the ability to make significant improvements in function in a reasonable and predictable amount of time.     Precautions / Restrictions Precautions Precautions: Fall Recall of Precautions/Restrictions: Intact Precaution/Restrictions Comments: bowel urgency (wears depends) Restrictions Weight Bearing Restrictions Per Provider Order: No       Mobility  Bed Mobility Overal bed mobility: Modified Independent   General bed mobility comments: HOB elevated    Transfers Overall transfer level: Modified independent Equipment used: None   Ambulation/Gait Ambulation/Gait assistance: Supervision Gait Distance (Feet): 180 Feet Assistive device: None, IV Pole Gait Pattern/deviations: Step-through pattern, Decreased stride length Gait velocity: Decreased    General Gait Details: would intermittently reach for 1 UE support with use of IV pole after ~80 ft. Guarded gait with supervision for safety     Balance Overall balance assessment: Mild deficits observed, not formally tested        Pertinent Vitals/Pain Pain Assessment Pain Assessment: Faces Faces Pain Scale: Hurts little more Pain Location: abdomen and low back Pain Descriptors / Indicators: Throbbing Pain Intervention(s): Limited activity within patient's tolerance, Monitored during session, Repositioned    Home Living Family/patient expects to be discharged to:: Private residence Living Arrangements: Spouse/significant other;Children Available Help at Discharge: Family;Neighbor;Available 24 hours/day Type of Home: House Home Access: Stairs to enter;Ramped entrance Entrance Stairs-Rails: Left Entrance Stairs-Number of Steps: 2   Home Layout: One level Home Equipment: Agricultural Consultant (2 wheels);Cane - quad;Cane - single point;BSC/3in1;Grab bars - tub/shower;Wheelchair - manual Additional Comments: BSC over toilet. place a chair in their shower. lives with husband who uses RW since recent stroke    Prior Function Prior Level of Function : Independent/Modified Independent;History of Falls (last six months)      Mobility Comments: Furniture surfs in the home. SPC for community mobility. ADLs Comments: Mod I with ADLs most IADLs; receives assistance for transportation; Daughter also provides PRN assist for other IADLs; pt enjoys growing a garden, pharmacist, community  from trees in  her yard, and spending time with family     Extremity/Trunk Assessment   Upper Extremity Assessment Upper Extremity Assessment: Defer to OT evaluation LUE Deficits / Details: strength, ROM, sensation WFL; mildly decreased fine motor coordination, but able to use functionally LUE Coordination: decreased fine motor (mild, WFL)    Lower Extremity Assessment Lower Extremity Assessment: Overall WFL for tasks assessed    Cervical / Trunk Assessment Cervical / Trunk Assessment: Normal  Communication   Communication Communication: Impaired Factors Affecting Communication: Difficulty expressing self (some expressive aphasia; pt reports word finding/speech difficulties since stroke)    Cognition Arousal: Alert Behavior During Therapy: WFL for tasks assessed/performed   PT - Cognitive impairments: No apparent impairments    Following commands: Intact       Cueing Cueing Techniques: Verbal cues      PT Assessment Patient needs continued PT services  PT Problem List Decreased activity tolerance;Decreased balance;Decreased mobility       PT Treatment Interventions DME instruction;Gait training;Stair training;Functional mobility training;Therapeutic activities;Therapeutic exercise;Balance training;Patient/family education    PT Goals (Current goals can be found in the Care Plan section)  Acute Rehab PT Goals Patient Stated Goal: return home PT Goal Formulation: With patient Time For Goal Achievement: 04/20/24 Potential to Achieve Goals: Good    Frequency Min 1X/week        AM-PAC PT 6 Clicks Mobility  Outcome Measure Help needed turning from your back to your side while in a flat bed without using bedrails?: None Help needed moving from lying on your back to sitting on the side of a flat bed without using bedrails?: None Help needed moving to and from a bed to a chair (including a wheelchair)?: None Help needed standing up from a chair using your arms  (e.g., wheelchair or bedside chair)?: None Help needed to walk in hospital room?: A Little Help needed climbing 3-5 steps with a railing? : A Little 6 Click Score: 22    End of Session   Activity Tolerance: Patient tolerated treatment well Patient left: in bed;with call bell/phone within reach;with bed alarm set Nurse Communication: Mobility status PT Visit Diagnosis: Other abnormalities of gait and mobility (R26.89)    Time: 8487-8468 PT Time Calculation (min) (ACUTE ONLY): 19 min   Charges:   PT Evaluation $PT Eval Low Complexity: 1 Low   PT General Charges $$ ACUTE PT VISIT: 1 Visit        Kate ORN, PT, DPT Secure Chat Preferred  Rehab Office [phone]   Kate BRAVO Wendolyn 04/06/2024, 4:22 PM

## 2024-04-07 ENCOUNTER — Inpatient Hospital Stay (HOSPITAL_COMMUNITY)

## 2024-04-07 DIAGNOSIS — B9620 Unspecified Escherichia coli [E. coli] as the cause of diseases classified elsewhere: Secondary | ICD-10-CM

## 2024-04-07 DIAGNOSIS — A0472 Enterocolitis due to Clostridium difficile, not specified as recurrent: Secondary | ICD-10-CM

## 2024-04-07 DIAGNOSIS — N12 Tubulo-interstitial nephritis, not specified as acute or chronic: Secondary | ICD-10-CM

## 2024-04-07 DIAGNOSIS — R197 Diarrhea, unspecified: Secondary | ICD-10-CM | POA: Diagnosis not present

## 2024-04-07 DIAGNOSIS — Z1612 Extended spectrum beta lactamase (ESBL) resistance: Secondary | ICD-10-CM

## 2024-04-07 DIAGNOSIS — R112 Nausea with vomiting, unspecified: Secondary | ICD-10-CM

## 2024-04-07 LAB — GLUCOSE, CAPILLARY
Glucose-Capillary: 146 mg/dL — ABNORMAL HIGH (ref 70–99)
Glucose-Capillary: 131 mg/dL — ABNORMAL HIGH (ref 70–99)
Glucose-Capillary: 68 mg/dL — ABNORMAL LOW (ref 70–99)
Glucose-Capillary: 158 mg/dL — ABNORMAL HIGH (ref 70–99)
Glucose-Capillary: 132 mg/dL — ABNORMAL HIGH (ref 70–99)

## 2024-04-07 LAB — URINE CULTURE: Culture: >=100000 — AB

## 2024-04-07 LAB — TROPONIN T, HIGH SENSITIVITY
Troponin T High Sensitivity: 24 ng/L — ABNORMAL HIGH (ref 0–19)
Troponin T High Sensitivity: 41 ng/L — ABNORMAL HIGH (ref 0–19)
Troponin T High Sensitivity: 37 ng/L — ABNORMAL HIGH (ref 0–19)

## 2024-04-07 LAB — D-DIMER, QUANTITATIVE: D-Dimer, Quant: 0.39 ug{FEU}/mL (ref 0.00–0.50)

## 2024-04-07 MED ORDER — LABETALOL HCL 5 MG/ML IV SOLN: 10.0000 mg | Freq: Once | INTRAVENOUS | Status: AC

## 2024-04-07 MED ORDER — LACTATED RINGERS IV BOLUS
1000.0000 mL | Freq: Once | INTRAVENOUS | Status: AC
  Administered 2024-04-07: 1000 mL via INTRAVENOUS

## 2024-04-07 MED ORDER — LACTATED RINGERS IV SOLN: INTRAVENOUS | Status: DC

## 2024-04-07 MED ORDER — METOPROLOL TARTRATE 50 MG PO TABS
50.0000 mg | ORAL_TABLET | Freq: Two times a day (BID) | ORAL | Status: DC
  Administered 2024-04-07 – 2024-04-11 (×8): 50 mg via ORAL
  Filled 2024-04-07: qty 1

## 2024-04-07 MED ORDER — SODIUM CHLORIDE 0.9% FLUSH: 10.0000 mL | INTRAVENOUS | Status: DC | PRN

## 2024-04-07 MED ORDER — FIDAXOMICIN 200 MG PO TABS
200.0000 mg | ORAL_TABLET | Freq: Two times a day (BID) | ORAL | Status: DC
  Administered 2024-04-07 – 2024-04-11 (×9): 200 mg via ORAL
  Filled 2024-04-07 (×2): qty 1

## 2024-04-07 MED ORDER — NITROGLYCERIN 0.4 MG SL SUBL
SUBLINGUAL_TABLET | SUBLINGUAL | Status: AC
  Filled 2024-04-07: qty 1

## 2024-04-07 MED ORDER — DEXTROSE 50 % IV SOLN
INTRAVENOUS | Status: AC
  Filled 2024-04-07: qty 50

## 2024-04-07 MED ORDER — LABETALOL HCL 5 MG/ML IV SOLN
10.0000 mg | INTRAVENOUS | Status: DC | PRN
  Administered 2024-04-07: 10 mg via INTRAVENOUS
  Filled 2024-04-07: qty 4

## 2024-04-07 MED ORDER — SODIUM CHLORIDE 0.9% FLUSH
10.0000 mL | Freq: Two times a day (BID) | INTRAVENOUS | Status: DC
  Administered 2024-04-08 – 2024-04-10 (×3): 10 mL

## 2024-04-07 MED ORDER — LABETALOL HCL 5 MG/ML IV SOLN
INTRAVENOUS | Status: AC
  Administered 2024-04-07: 10 mg via INTRAVENOUS
  Filled 2024-04-07: qty 4

## 2024-04-07 NOTE — Progress Notes (Signed)
 Chaplain responds to code STEMI and provides support and empathetic encouragement to pt, spouse, and daughter at bedside. All deny spiritual care needs. STEMI is canceled as chaplain is called to ED.

## 2024-04-07 NOTE — Progress Notes (Signed)
 9149: pt c/o right sided arm pain. Radiating to the right ear, right eye, chest area and SOB. VS per flow sheet, EKG done, Rapid Response called, MD paged, code STEMI activated.   0908: nitroglycerin  0.4mg  SL given with good relief. Pt reports pain almost gone.   0915: code STEMI cancelled by elink MD. 0920: Dr. Cindy at bedside, labetolol 10mg  IV given.  0930: pt in SR at 70. LR bolus started.   Will continue to monitor pt.   Dallas Gustin LPN

## 2024-04-07 NOTE — Progress Notes (Signed)
" °  Progress Note   Patient: Cassandra Brooks FMW:[id number] DOB: [birth year] DOA: 04/05/2024     2 DOS: the patient was seen and examined on 04/07/2024   Brief hospital course: 83 year old female CAD cath 07/29/2022 total occlusion small posterior descending branch RCA previously aspirin  Brilinta -with complication of pain in the chest likely GI related Fibromyalgia Depression HTN HLD Known history of chronic fecal incontinence?  Overflow followed by Dr. Charlanne previously on FiberCon and Imodium    Admit 12/11-12/16 2025 progressive LBP nausea vomiting-CT ABD pelvis = right-sided pyelo-Rx Rocephin  2 g daily-diarrhea during hospital stay thought to be noninfectious patient discharged home on Amoxil -- 12/27 came in similar complaint nonbilious nonbloody diarrhea subjective fever chills CT abdomen pelvis residual right-sided pyelo blood culture X2 ordered 12/26, urine culture pending E. coli noted C. difficile also positive  Assessment and Plan:   C. difficile colitis Family reports months of diarrhea that worsened over the past several weeks Stool studies pos for cdiff Discussed with ID, appreciate recs. Recs for fidaxomycin x 10 days Recs to stop systemic abx   E. coli lower UTI ruled out Stop further abx per ID recs in the setting of cdiff No evidence of active pyelo or UTI at this time   CAD cath 2024 RCA stent Current medications include metoprolol  25 twice daily a VaPro 75 amlodipine  10 atorvastatin  80 Have held aspirin /Plavix  can resume later during hospital stay Holding HCTZ   Macrocytic anemia B12 2024 folic acid >20 on 03/23/2024 Needs outpatient workup-could have congenital/hereditary disease-given her age would also consider some element of myelodysplasia She has had macrocytosis since 2015   Prediabetes -A1c 6.3 Not on meds   spinal stenosis  Sinus tach HR as high as 160's noted, assosicated with chest pains Increased BB and added PRN labetalol  IV for HR>120 Trop  20->40, likely demand F/u on repeat limited 2d echo F/u on ddimer   Subjective: Complained of some chest discomfort this AM  Physical Exam: Vitals:   04/07/24 0920 04/07/24 1706 04/07/24 1750 04/07/24 1805  BP: (!) 146/91 (!) 147/69 (!) 152/102 133/60  Pulse: (!) 169 69 (!) 175 79  Resp: 18  18 18   Temp: 98.1 F (36.7 C)  98.7 F (37.1 C) 98 F (36.7 C)  TempSrc: Oral  Oral Oral  SpO2: 95% 93% 95%   Weight:      Height:       General exam: Awake, laying in bed, in nad Respiratory system: Normal respiratory effort, no wheezing Cardiovascular system: regular rate, s1, s2 Gastrointestinal system: Soft, nondistended, positive BS Central nervous system: CN2-12 grossly intact, strength intact Extremities: Perfused, no clubbing Skin: Normal skin turgor, no notable skin lesions seen Psychiatry: Mood normal // no visual hallucinations   Data Reviewed:  There are no new results to review at this time.  Family Communication: Pt in room, family at bedside  Disposition: Status is: Inpatient Remains inpatient appropriate because: severity of illness  Planned Discharge Destination: Home    Author: Garnette Pelt, MD 04/07/2024 6:55 PM  For on call review [email or web address].  "

## 2024-04-07 NOTE — Consult Note (Signed)
 "        [facility]    Date of Admission:  04/05/2024     Reason for Consult: cdiff    Referring Provider: Cindy     Lines:  peripheral  Abx: 12/26-c ceftriaxone         Assessment: 83 yo female htn, hlp, cad, lumbago, recent pyelo s/p appropriate abx course now readmitted for diarrhea and n/v  12/11 admission for pyelo finished rocephin  --> amox/clav on 12/20 for 10 day course. Unfortunately no urine culture was done at that time  She now developed sx and with stool study consistent with cdiff infection (cdiff toxin positive by eia)  This admission ucx did grow esbl ecoli and ct showed residual sign of right pyelo however I do not see any sign of uti/pyelo at this time. And given cdiff concern will treat that first and observe.   Maybe true uti recently and at that time no urine cx but ?has another bacterial presence not esbl?  High threshold to treat uti/pyelo unless frank sx suggestive.     Of note, she is an extremely difficult historian, has had intermittent for years mild burning but seems to describe external vulva burning rather than any true uti (no urgency/frequency). Also described months of diarrhea may be worse the past 2-3 weeks    Plan: Finish 10 day course fidaxomycin If diarrhea improves in 2-3 days can discharge from id standpoint maintain cdiff isolation precaution Dc systemic abx and will monitor for true sign of uti/pyelo -- high threshold to start any systemic abx Discussed with primary team     ------------------------------------------------ Principal Problem:   Pyelonephritis Active Problems:   GERD (gastroesophageal reflux disease)   Essential hypertension   Diabetes mellitus due to underlying condition with unspecified complications (HCC)   Spinal stenosis of lumbar region with neurogenic claudication   Mixed hyperlipidemia   CAD (coronary artery disease)   Diarrhea   Stroke (cerebrum)  (HCC)    HPI: Cassandra Brooks is a 83 y.o. female recent uti tx admitted for acute on chronic diarrhea, n/v   Patient difficult historian Chronic intermittent external vulva burning -- she used a vaseline coating around urethral meatus and said that helps. No urgency frequency 12/11-12/20 ceftriaxone  --> augmentin  for uti. No cx  Chronic months diarrhea 3-4 a day but past 2-3 weeks up to 7 and incontinence. Watery N/v the last few days  No fever chill  No other sx  Eating actually well  Afebrile and no leukocytosis on exam Stool testing did show cdiff toxin by eia Ucx esbl ecoli Bcx negative  Put on ceftriaxone  again at admission   Id called for cdiff positive testing   Family History  Problem Relation Age of Onset   Hypertension Father    Stomach cancer Father    Glaucoma Mother    Diabetes Mother    Diabetes Sister    Diabetes Brother    Lupus Child    Diabetes Brother    Lung cancer Brother    Colon cancer Brother     Social History[1]  Allergies[2]  Review of Systems: ROS All Other ROS was negative, except mentioned above   Past Medical History:  Diagnosis Date   AAA (abdominal aortic aneurysm)    ACS (acute coronary syndrome) (HCC) 08/01/2022   Arthritis    knees (03/21/2017)   Borderline diabetes    Bulging lumbar disc    CAD (coronary artery disease) 08/17/2022   Depression  Diabetes mellitus due to underlying condition with unspecified complications (HCC) 12/19/2019   Disequilibrium    Dizziness    Essential hypertension 12/19/2019   Fall    Fibromyalgia    GERD (gastroesophageal reflux disease)    Glaucoma, both eyes    Hemoptysis 03/18/2015   Hyperlipidemia    don't have it but I take RX (03/21/2017)   Hypertension    Mixed dyslipidemia 12/19/2019   Mixed hyperlipidemia 04/18/2017   Nuclear cataract of both eyes 01/01/2014   Obesity (BMI 30.0-34.9) 09/29/2022   Plantar fasciitis 10/21/2020   Posterior tibial tendon  dysfunction (PTTD) of right lower extremity 11/16/2021   Pre-diabetes    pt states she is not diabetic. Last A1C was normal. Pt is on no medications for DM   Preoperative cardiovascular examination 12/19/2019   Primary osteoarthritis of left foot 10/21/2020   Primary osteoarthritis of left knee 07/18/2019   Solitary pulmonary nodule 03/18/2015   03/2015 6mm RLL nodule    Spinal stenosis of lumbar region with neurogenic claudication 04/26/2021   Spinal stenosis, lumbar region with neurogenic claudication 04/26/2021   Central stenosis, L2-3 and L3-4 with moderate foramenal narrowing L4-5   Status post AAA (abdominal aortic aneurysm) repair 12/19/2019   Status post total left knee replacement 03/27/2020   STEMI (ST elevation myocardial infarction) (HCC) 08/01/2022   STEMI involving right coronary artery (HCC) 07/29/2022   Syncope 03/21/2017   Syncope and collapse    TIA (transient ischemic attack) 08/03/2023       Scheduled Meds:  acidophilus  1 capsule Oral TID WC   amLODipine   10 mg Oral Daily   atorvastatin   80 mg Oral Daily   clopidogrel   75 mg Oral Daily   diclofenac   1 patch Transdermal BID   diclofenac  Sodium  2 g Topical QID   dorzolamide -timolol   1 drop Both Eyes BID   feeding supplement  237 mL Oral BID BM   fidaxomicin   200 mg Oral BID   fluticasone   2 spray Each Nare Daily   heparin   5,000 Units Subcutaneous Q8H   insulin  aspart  0-6 Units Subcutaneous TID WC   irbesartan   75 mg Oral Daily   metoprolol  tartrate  50 mg Oral BID   multivitamin with minerals  1 tablet Oral Daily   nitroGLYCERIN        sodium chloride  flush  10-40 mL Intracatheter Q12H   sodium chloride  flush  3 mL Intravenous Q12H   sodium chloride  flush  3 mL Intravenous Q12H   venlafaxine  XR  150 mg Oral Q breakfast   Continuous Infusions:  sodium chloride  Stopped (04/07/24 0435)   lactated ringers  75 mL/hr at 04/07/24 1125   PRN Meds:.acetaminophen  **OR** acetaminophen , baclofen , hydrALAZINE ,  HYDROmorphone  (DILAUDID ) injection, ipratropium, labetalol , LORazepam , nitroGLYCERIN , nitroGLYCERIN , ondansetron  **OR** ondansetron  (ZOFRAN ) IV, oxyCODONE , sodium chloride  flush, traZODone    OBJECTIVE: Blood pressure (!) 146/91, pulse (!) 169, temperature 98.1 F (36.7 C), temperature source Oral, resp. rate 18, height 5' 1 (1.549 m), weight 74.6 kg, SpO2 95%.  Physical Exam  General/constitutional: no distress, pleasant HEENT: Normocephalic, PER, Conj Clear, EOMI, Oropharynx clear Neck supple CV: rrr no mrg Lungs: clear to auscultation, normal respiratory effort Abd: Soft, Nontender Ext: no edema Skin: No Rash Neuro: nonfocal MSK: no peripheral joint swelling/tenderness/warmth; back spines nontender    Lab Results Lab Results  Component Value Date   WBC 8.2 04/06/2024   HGB 11.3 (L) 04/06/2024   HCT 34.6 (L) 04/06/2024   MCV 106.1 (H) 04/06/2024  PLT 223 04/06/2024    Lab Results  Component Value Date   CREATININE 1.06 (H) 04/06/2024   BUN 24 (H) 04/06/2024   NA 136 04/06/2024   K 4.4 04/06/2024   CL 103 04/06/2024   CO2 22 04/06/2024    Lab Results  Component Value Date   ALT 17 04/06/2024   AST 21 04/06/2024   ALKPHOS 66 04/06/2024   BILITOT 0.2 04/06/2024      Microbiology: Recent Results (from the past 240 hours)  Resp panel by RT-PCR (RSV, Flu A&B, Covid) Anterior Nasal Swab     Status: None   Collection Time: 04/05/24  8:27 AM   Specimen: Anterior Nasal Swab  Result Value Ref Range Status   SARS Coronavirus 2 by RT PCR NEGATIVE NEGATIVE Final   Influenza A by PCR NEGATIVE NEGATIVE Final   Influenza B by PCR NEGATIVE NEGATIVE Final    Comment: (NOTE) The Xpert Xpress SARS-CoV-2/FLU/RSV plus assay is intended as an aid in the diagnosis of influenza from Nasopharyngeal swab specimens and should not be used as a sole basis for treatment. Nasal washings and aspirates are unacceptable for Xpert Xpress SARS-CoV-2/FLU/RSV testing.  Fact Sheet for  Patients: bloggercourse.com  Fact Sheet for Healthcare Providers: seriousbroker.it  This test is not yet approved or cleared by the [place]  FDA and has been authorized for detection and/or diagnosis of SARS-CoV-2 by FDA under an Emergency Use Authorization (EUA). This EUA will remain in effect (meaning this test can be used) for the duration of the COVID-19 declaration under Section 564(b)(1) of the Act, 21 U.S.C. section 360bbb-3(b)(1), unless the authorization is terminated or revoked.     Resp Syncytial Virus by PCR NEGATIVE NEGATIVE Final    Comment: (NOTE) Fact Sheet for Patients: bloggercourse.com  Fact Sheet for Healthcare Providers: seriousbroker.it  This test is not yet approved or cleared by the [place]  FDA and has been authorized for detection and/or diagnosis of SARS-CoV-2 by FDA under an Emergency Use Authorization (EUA). This EUA will remain in effect (meaning this test can be used) for the duration of the COVID-19 declaration under Section 564(b)(1) of the Act, 21 U.S.C. section 360bbb-3(b)(1), unless the authorization is terminated or revoked.  Performed at [facility], [address] [place], [place] [address]   C Difficile Quick Screen w PCR reflex     Status: Abnormal   Collection Time: 04/05/24 11:10 AM   Specimen: STOOL  Result Value Ref Range Status   C Diff antigen POSITIVE (A) NEGATIVE Final   C Diff toxin POSITIVE (A) NEGATIVE Final    Comment: RESULT CALLED TO, READ BACK BY AND VERIFIED WITH: RN Wanda HERO on [phone] @2018  by SM    C Diff interpretation Toxin producing C. difficile detected.  Final    Comment: Performed at [facility], [address], [place], [place] [address]  Urine Culture     Status: Abnormal   Collection Time: 04/05/24 11:58 AM   Specimen: Urine, Random  Result Value Ref Range Status   Specimen  Description URINE, RANDOM  Final   Special Requests   Final    NONE Reflexed from [id number] Performed at [facility], [address] [place], [place] [address]    Culture (A)  Final    >=100,000 COLONIES/mL ESCHERICHIA COLI Confirmed Extended Spectrum Beta-Lactamase Producer (ESBL).  In bloodstream infections from ESBL organisms, carbapenems are preferred over piperacillin/tazobactam. They are shown to have a lower risk of mortality.  Report Status 04/07/2024 FINAL  Final   Organism ID, Bacteria ESCHERICHIA COLI (A)  Final      Susceptibility   Escherichia coli - MIC*    AMPICILLIN >=32 RESISTANT Resistant     CEFAZOLIN  (URINE) Value in next row Resistant      >=32 RESISTANTThis is a modified FDA-approved test that has been validated and its performance characteristics determined by the reporting laboratory.  This laboratory is certified under the Clinical Laboratory Improvement Amendments CLIA as qualified to perform high complexity clinical laboratory testing.    CEFEPIME Value in next row Resistant      >=32 RESISTANTThis is a modified FDA-approved test that has been validated and its performance characteristics determined by the reporting laboratory.  This laboratory is certified under the Clinical Laboratory Improvement Amendments CLIA as qualified to perform high complexity clinical laboratory testing.    ERTAPENEM Value in next row Sensitive      >=32 RESISTANTThis is a modified FDA-approved test that has been validated and its performance characteristics determined by the reporting laboratory.  This laboratory is certified under the Clinical Laboratory Improvement Amendments CLIA as qualified to perform high complexity clinical laboratory testing.    CEFTRIAXONE  Value in next row Resistant      >=32 RESISTANTThis is a modified FDA-approved test that has been validated and its performance characteristics determined by the reporting laboratory.  This laboratory is certified under  the Clinical Laboratory Improvement Amendments CLIA as qualified to perform high complexity clinical laboratory testing.    CIPROFLOXACIN Value in next row Resistant      >=32 RESISTANTThis is a modified FDA-approved test that has been validated and its performance characteristics determined by the reporting laboratory.  This laboratory is certified under the Clinical Laboratory Improvement Amendments CLIA as qualified to perform high complexity clinical laboratory testing.    GENTAMICIN Value in next row Sensitive      >=32 RESISTANTThis is a modified FDA-approved test that has been validated and its performance characteristics determined by the reporting laboratory.  This laboratory is certified under the Clinical Laboratory Improvement Amendments CLIA as qualified to perform high complexity clinical laboratory testing.    NITROFURANTOIN Value in next row Sensitive      >=32 RESISTANTThis is a modified FDA-approved test that has been validated and its performance characteristics determined by the reporting laboratory.  This laboratory is certified under the Clinical Laboratory Improvement Amendments CLIA as qualified to perform high complexity clinical laboratory testing.    TRIMETH/SULFA Value in next row Sensitive      >=32 RESISTANTThis is a modified FDA-approved test that has been validated and its performance characteristics determined by the reporting laboratory.  This laboratory is certified under the Clinical Laboratory Improvement Amendments CLIA as qualified to perform high complexity clinical laboratory testing.    AMPICILLIN/SULBACTAM Value in next row Sensitive      >=32 RESISTANTThis is a modified FDA-approved test that has been validated and its performance characteristics determined by the reporting laboratory.  This laboratory is certified under the Clinical Laboratory Improvement Amendments CLIA as qualified to perform high complexity clinical laboratory testing.    PIP/TAZO Value in  next row Sensitive      <=4 SENSITIVEThis is a modified FDA-approved test that has been validated and its performance characteristics determined by the reporting laboratory.  This laboratory is certified under the Clinical Laboratory Improvement Amendments CLIA as qualified to perform high complexity clinical laboratory testing.    MEROPENEM Value in next row Sensitive      <=  4 SENSITIVEThis is a modified FDA-approved test that has been validated and its performance characteristics determined by the reporting laboratory.  This laboratory is certified under the Clinical Laboratory Improvement Amendments CLIA as qualified to perform high complexity clinical laboratory testing.    * >=100,000 COLONIES/mL ESCHERICHIA COLI  Culture, blood (Routine X 2) w Reflex to ID Panel     Status: None (Preliminary result)   Collection Time: 04/05/24  2:43 PM   Specimen: BLOOD RIGHT HAND  Result Value Ref Range Status   Specimen Description BLOOD RIGHT HAND  Final   Special Requests   Final    BOTTLES DRAWN AEROBIC AND ANAEROBIC Blood Culture results may not be optimal due to an inadequate volume of blood received in culture bottles   Culture   Final    NO GROWTH 2 DAYS Performed at [facility], [address] [place], [place] [address]    Report Status PENDING  Incomplete  Culture, blood (Routine X 2) w Reflex to ID Panel     Status: None (Preliminary result)   Collection Time: 04/05/24  2:45 PM   Specimen: BLOOD RIGHT HAND  Result Value Ref Range Status   Specimen Description BLOOD RIGHT HAND  Final   Special Requests   Final    BOTTLES DRAWN AEROBIC ONLY Blood Culture results may not be optimal due to an inadequate volume of blood received in culture bottles   Culture   Final    NO GROWTH 2 DAYS Performed at [facility], [address], [place], [place] [address]    Report Status PENDING  Incomplete     Serology:    Imaging: If present, new imagings (plain films, ct scans, and  mri) have been personally visualized and interpreted; radiology reports have been reviewed. Decision making incorporated into the Impression / Recommendations.  12/26 ct abd pelv with contrast 1. Persistent low density areas are noted in right renal parenchyma on delayed post-contrast imaging suggesting some degree of residual right-sided pyelonephritis as noted on prior exam 15 days ago. 2. Sigmoid diverticulosis without inflammation. 3. Aortic atherosclerosis.  12/26 mri lumbar spine 1. Unchanged postoperative appearance from prior L2-3 and L3-4 laminectomies. 2. At L4-5, moderate bilateral subarticular zone narrowing (right greater than left) with mass effect on the traversing bilateral L5 nerve roots, and moderate right neural foraminal narrowing. 3. Multilevel neural foraminal narrowing, worst at L3-4 with severe left and moderate right narrowing.  Constance ONEIDA Passer, MD [facility] [phone] pager    04/07/2024, 1:45 PM     [1]  Social History Tobacco Use   Smoking status: Never   Smokeless tobacco: Never  Vaping Use   Vaping status: Never Used  Substance Use Topics   Alcohol use: No   Drug use: No  [2]  Allergies Allergen Reactions   Zestril  [Lisinopril ] Cough   "

## 2024-04-08 ENCOUNTER — Inpatient Hospital Stay (HOSPITAL_COMMUNITY)

## 2024-04-08 ENCOUNTER — Telehealth (HOSPITAL_COMMUNITY): Payer: Self-pay | Admitting: Pharmacy Technician

## 2024-04-08 ENCOUNTER — Other Ambulatory Visit (HOSPITAL_COMMUNITY): Payer: Self-pay

## 2024-04-08 DIAGNOSIS — R079 Chest pain, unspecified: Secondary | ICD-10-CM

## 2024-04-08 DIAGNOSIS — B9620 Unspecified Escherichia coli [E. coli] as the cause of diseases classified elsewhere: Secondary | ICD-10-CM | POA: Diagnosis not present

## 2024-04-08 DIAGNOSIS — A0472 Enterocolitis due to Clostridium difficile, not specified as recurrent: Secondary | ICD-10-CM

## 2024-04-08 DIAGNOSIS — R197 Diarrhea, unspecified: Secondary | ICD-10-CM | POA: Diagnosis not present

## 2024-04-08 DIAGNOSIS — Z1612 Extended spectrum beta lactamase (ESBL) resistance: Secondary | ICD-10-CM | POA: Diagnosis not present

## 2024-04-08 DIAGNOSIS — N12 Tubulo-interstitial nephritis, not specified as acute or chronic: Secondary | ICD-10-CM | POA: Diagnosis not present

## 2024-04-08 DIAGNOSIS — R112 Nausea with vomiting, unspecified: Secondary | ICD-10-CM | POA: Diagnosis not present

## 2024-04-08 LAB — CBC
HCT: 29.7 % — ABNORMAL LOW (ref 36.0–46.0)
Hemoglobin: 9.6 g/dL — ABNORMAL LOW (ref 12.0–15.0)
MCH: 35.2 pg — ABNORMAL HIGH (ref 26.0–34.0)
MCHC: 32.3 g/dL (ref 30.0–36.0)
MCV: 108.8 fL — ABNORMAL HIGH (ref 80.0–100.0)
Platelets: 173 K/uL (ref 150–400)
RBC: 2.73 MIL/uL — ABNORMAL LOW (ref 3.87–5.11)
RDW: 14 % (ref 11.5–15.5)
WBC: 7.9 K/uL (ref 4.0–10.5)
nRBC: 0 % (ref 0.0–0.2)

## 2024-04-08 LAB — COMPREHENSIVE METABOLIC PANEL WITH GFR
ALT: 26 U/L (ref 0–44)
AST: 27 U/L (ref 15–41)
Albumin: 3 g/dL — ABNORMAL LOW (ref 3.5–5.0)
Alkaline Phosphatase: 62 U/L (ref 38–126)
Anion gap: 10 (ref 5–15)
BUN: 26 mg/dL — ABNORMAL HIGH (ref 8–23)
CO2: 23 mmol/L (ref 22–32)
Calcium: 8 mg/dL — ABNORMAL LOW (ref 8.9–10.3)
Chloride: 109 mmol/L (ref 98–111)
Creatinine, Ser: 0.94 mg/dL (ref 0.44–1.00)
GFR, Estimated: >60 mL/min
Glucose, Bld: 107 mg/dL — ABNORMAL HIGH (ref 70–99)
Potassium: 3.5 mmol/L (ref 3.5–5.1)
Sodium: 142 mmol/L (ref 135–145)
Total Bilirubin: 0.2 mg/dL (ref 0.0–1.2)
Total Protein: 5.3 g/dL — ABNORMAL LOW (ref 6.5–8.1)

## 2024-04-08 LAB — GLUCOSE, CAPILLARY
Glucose-Capillary: 93 mg/dL (ref 70–99)
Glucose-Capillary: 111 mg/dL — ABNORMAL HIGH (ref 70–99)
Glucose-Capillary: 133 mg/dL — ABNORMAL HIGH (ref 70–99)
Glucose-Capillary: 135 mg/dL — ABNORMAL HIGH (ref 70–99)

## 2024-04-08 LAB — ECHOCARDIOGRAM COMPLETE
AR max vel: 2.45 cm2
AV Area VTI: 2.47 cm2
AV Area mean vel: 2.57 cm2
AV Mean grad: 3 mmHg
AV Peak grad: 6.3 mmHg
Ao pk vel: 1.25 m/s
Area-P 1/2: 2.86 cm2
Calc EF: 66.1 %
Height: 61 in
S' Lateral: 2.5 cm
Single Plane A2C EF: 71 %
Single Plane A4C EF: 58.4 %
Weight: 2832.47 [oz_av]

## 2024-04-08 LAB — TROPONIN T, HIGH SENSITIVITY: Troponin T High Sensitivity: 37 ng/L — ABNORMAL HIGH (ref 0–19)

## 2024-04-08 LAB — MAGNESIUM: Magnesium: 1.5 mg/dL — ABNORMAL LOW (ref 1.7–2.4)

## 2024-04-08 MED ORDER — POTASSIUM CHLORIDE CRYS ER 20 MEQ PO TBCR
40.0000 meq | EXTENDED_RELEASE_TABLET | ORAL | Status: AC
  Administered 2024-04-08 (×2): 40 meq via ORAL
  Filled 2024-04-08 (×2): qty 2

## 2024-04-08 MED ORDER — MAGNESIUM SULFATE 4 GM/100ML IV SOLN
4.0000 g | Freq: Once | INTRAVENOUS | Status: AC
  Administered 2024-04-08: 4 g via INTRAVENOUS
  Filled 2024-04-08: qty 100

## 2024-04-08 MED ORDER — KETOROLAC TROMETHAMINE 15 MG/ML IJ SOLN
15.0000 mg | Freq: Three times a day (TID) | INTRAMUSCULAR | Status: DC | PRN
  Administered 2024-04-09: 15 mg via INTRAVENOUS
  Filled 2024-04-08: qty 1

## 2024-04-08 MED ORDER — FAMOTIDINE 20 MG PO TABS
40.0000 mg | ORAL_TABLET | Freq: Every day | ORAL | Status: DC
  Administered 2024-04-08 – 2024-04-11 (×4): 40 mg via ORAL
  Filled 2024-04-08 (×4): qty 2

## 2024-04-08 NOTE — Progress Notes (Signed)
 "        [facility]  Date of Admission:  04/05/2024     Lines:  peripheral   Abx: 12/28-c fidaxomycin  12/26-12/28 ceftriaxone                                                         Assessment: 83 yo female htn, hlp, cad, lumbago, recent pyelo s/p appropriate abx course now readmitted for diarrhea and n/v   12/11 admission for pyelo finished rocephin  --> amox/clav on 12/20 for 10 day course. Unfortunately no urine culture was done at that time   She now developed sx and with stool study consistent with cdiff infection (cdiff toxin positive by eia)   This admission ucx did grow esbl ecoli and ct showed residual sign of right pyelo however I do not see any sign of uti/pyelo at this time. And given cdiff concern will treat that first and observe.    Maybe true uti recently and at that time no urine cx but ?has another bacterial presence not esbl?   High threshold to treat uti/pyelo unless frank sx suggestive.        Of note, she is an extremely difficult historian, has had intermittent for years mild burning but seems to describe external vulva burning rather than any true uti (no urgency/frequency). Also described months of diarrhea may be worse the past 2-3 weeks     -------------- 04/08/24 id assessment 5-6 bm a day No other complaint Fidaxomycin just started yesterday   Plan: Finish 10 day course fidaxomycin on 04/18/24 If diarrhea improves in 2-3 days from initiation of therapy (<4 a day or much more formed stool) can discharge from id standpoint maintain cdiff isolation precaution Id will sign off Discussed with primary team  Principal Problem:   Pyelonephritis Active Problems:   GERD (gastroesophageal reflux disease)   Essential hypertension   Diabetes mellitus due to underlying condition with unspecified complications (HCC)   Spinal stenosis of lumbar region with neurogenic claudication   Mixed hyperlipidemia   CAD (coronary  artery disease)   Diarrhea   Stroke (cerebrum) (HCC)   Allergies[1]  Scheduled Meds:  acidophilus  1 capsule Oral TID WC   amLODipine   10 mg Oral Daily   atorvastatin   80 mg Oral Daily   clopidogrel   75 mg Oral Daily   diclofenac   1 patch Transdermal BID   diclofenac  Sodium  2 g Topical QID   dorzolamide -timolol   1 drop Both Eyes BID   famotidine   40 mg Oral Daily   feeding supplement  237 mL Oral BID BM   fidaxomicin   200 mg Oral BID   fluticasone   2 spray Each Nare Daily   heparin   5,000 Units Subcutaneous Q8H   insulin  aspart  0-6 Units Subcutaneous TID WC   irbesartan   75 mg Oral Daily   metoprolol  tartrate  50 mg Oral BID   multivitamin with minerals  1 tablet Oral Daily   sodium chloride  flush  10-40 mL Intracatheter Q12H   sodium chloride  flush  3 mL Intravenous Q12H   sodium chloride  flush  3 mL Intravenous Q12H   venlafaxine  XR  150 mg Oral Q breakfast   Continuous Infusions: PRN Meds:.acetaminophen  **OR** acetaminophen , baclofen , hydrALAZINE , HYDROmorphone  (DILAUDID ) injection, ipratropium, ketorolac , labetalol , LORazepam , nitroGLYCERIN , ondansetron  **OR** ondansetron  (  ZOFRAN ) IV, oxyCODONE , sodium chloride  flush, traZODone    SUBJECTIVE: 5-6 bm a day Soft and watery No fever, chill No n/v   No leukocytosis   Review of Systems: ROS All other ROS was negative, except mentioned above     OBJECTIVE: Vitals:   04/08/24 0410 04/08/24 0541 04/08/24 0731 04/08/24 1641  BP:  (!) 145/65 (!) 149/63 (!) 164/66  Pulse:  66 65 73  Resp: 16 18 18 18   Temp:  98.1 F (36.7 C) 97.8 F (36.6 C) 98.1 F (36.7 C)  TempSrc:  Oral    SpO2:  100% 97% 95%  Weight: 80.3 kg     Height:       Body mass index is 33.45 kg/m.  Physical Exam General/constitutional: no distress, pleasant HEENT: Normocephalic, PER, Conj Clear, EOMI, Oropharynx clear Neck supple CV: rrr no mrg Lungs: clear to auscultation, normal respiratory effort Abd: Soft, Nontender Ext: no  edema Skin: No Rash Neuro: nonfocal MSK: no peripheral joint swelling/tenderness/warmth; back spines nontender   Lab Results Lab Results  Component Value Date   WBC 7.9 04/08/2024   HGB 9.6 (L) 04/08/2024   HCT 29.7 (L) 04/08/2024   MCV 108.8 (H) 04/08/2024   PLT 173 04/08/2024    Lab Results  Component Value Date   CREATININE 0.94 04/08/2024   BUN 26 (H) 04/08/2024   NA 142 04/08/2024   K 3.5 04/08/2024   CL 109 04/08/2024   CO2 23 04/08/2024    Lab Results  Component Value Date   ALT 26 04/08/2024   AST 27 04/08/2024   ALKPHOS 62 04/08/2024   BILITOT 0.2 04/08/2024      Microbiology: Recent Results (from the past 240 hours)  Resp panel by RT-PCR (RSV, Flu A&B, Covid) Anterior Nasal Swab     Status: None   Collection Time: 04/05/24  8:27 AM   Specimen: Anterior Nasal Swab  Result Value Ref Range Status   SARS Coronavirus 2 by RT PCR NEGATIVE NEGATIVE Final   Influenza A by PCR NEGATIVE NEGATIVE Final   Influenza B by PCR NEGATIVE NEGATIVE Final    Comment: (NOTE) The Xpert Xpress SARS-CoV-2/FLU/RSV plus assay is intended as an aid in the diagnosis of influenza from Nasopharyngeal swab specimens and should not be used as a sole basis for treatment. Nasal washings and aspirates are unacceptable for Xpert Xpress SARS-CoV-2/FLU/RSV testing.  Fact Sheet for Patients: bloggercourse.com  Fact Sheet for Healthcare Providers: seriousbroker.it  This test is not yet approved or cleared by the [place]  FDA and has been authorized for detection and/or diagnosis of SARS-CoV-2 by FDA under an Emergency Use Authorization (EUA). This EUA will remain in effect (meaning this test can be used) for the duration of the COVID-19 declaration under Section 564(b)(1) of the Act, 21 U.S.C. section 360bbb-3(b)(1), unless the authorization is terminated or revoked.     Resp Syncytial Virus by PCR NEGATIVE NEGATIVE Final     Comment: (NOTE) Fact Sheet for Patients: bloggercourse.com  Fact Sheet for Healthcare Providers: seriousbroker.it  This test is not yet approved or cleared by the [place]  FDA and has been authorized for detection and/or diagnosis of SARS-CoV-2 by FDA under an Emergency Use Authorization (EUA). This EUA will remain in effect (meaning this test can be used) for the duration of the COVID-19 declaration under Section 564(b)(1) of the Act, 21 U.S.C. section 360bbb-3(b)(1), unless the authorization is terminated or revoked.  Performed at [facility], [address], [place], [place] [address]  C Difficile Quick Screen w PCR reflex     Status: Abnormal   Collection Time: 04/05/24 11:10 AM   Specimen: STOOL  Result Value Ref Range Status   C Diff antigen POSITIVE (A) NEGATIVE Final   C Diff toxin POSITIVE (A) NEGATIVE Final    Comment: RESULT CALLED TO, READ BACK BY AND VERIFIED WITH: RN Wanda HERO on [phone] @2018  by SM    C Diff interpretation Toxin producing C. difficile detected.  Final    Comment: Performed at [facility], [address] [place], [place] [address]  Urine Culture     Status: Abnormal   Collection Time: 04/05/24 11:58 AM   Specimen: Urine, Random  Result Value Ref Range Status   Specimen Description URINE, RANDOM  Final   Special Requests   Final    NONE Reflexed from [id number] Performed at [facility], [address], [place], [place] [address]    Culture (A)  Final    >=100,000 COLONIES/mL ESCHERICHIA COLI Confirmed Extended Spectrum Beta-Lactamase Producer (ESBL).  In bloodstream infections from ESBL organisms, carbapenems are preferred over piperacillin/tazobactam. They are shown to have a lower risk of mortality.    Report Status 04/07/2024 FINAL  Final   Organism ID, Bacteria ESCHERICHIA COLI (A)  Final      Susceptibility   Escherichia coli - MIC*    AMPICILLIN >=32 RESISTANT  Resistant     CEFAZOLIN  (URINE) Value in next row Resistant      >=32 RESISTANTThis is a modified FDA-approved test that has been validated and its performance characteristics determined by the reporting laboratory.  This laboratory is certified under the Clinical Laboratory Improvement Amendments CLIA as qualified to perform high complexity clinical laboratory testing.    CEFEPIME Value in next row Resistant      >=32 RESISTANTThis is a modified FDA-approved test that has been validated and its performance characteristics determined by the reporting laboratory.  This laboratory is certified under the Clinical Laboratory Improvement Amendments CLIA as qualified to perform high complexity clinical laboratory testing.    ERTAPENEM Value in next row Sensitive      >=32 RESISTANTThis is a modified FDA-approved test that has been validated and its performance characteristics determined by the reporting laboratory.  This laboratory is certified under the Clinical Laboratory Improvement Amendments CLIA as qualified to perform high complexity clinical laboratory testing.    CEFTRIAXONE  Value in next row Resistant      >=32 RESISTANTThis is a modified FDA-approved test that has been validated and its performance characteristics determined by the reporting laboratory.  This laboratory is certified under the Clinical Laboratory Improvement Amendments CLIA as qualified to perform high complexity clinical laboratory testing.    CIPROFLOXACIN Value in next row Resistant      >=32 RESISTANTThis is a modified FDA-approved test that has been validated and its performance characteristics determined by the reporting laboratory.  This laboratory is certified under the Clinical Laboratory Improvement Amendments CLIA as qualified to perform high complexity clinical laboratory testing.    GENTAMICIN Value in next row Sensitive      >=32 RESISTANTThis is a modified FDA-approved test that has been validated and its performance  characteristics determined by the reporting laboratory.  This laboratory is certified under the Clinical Laboratory Improvement Amendments CLIA as qualified to perform high complexity clinical laboratory testing.    NITROFURANTOIN Value in next row Sensitive      >=32 RESISTANTThis is a modified FDA-approved test that has been validated  and its performance characteristics determined by the reporting laboratory.  This laboratory is certified under the Clinical Laboratory Improvement Amendments CLIA as qualified to perform high complexity clinical laboratory testing.    TRIMETH/SULFA Value in next row Sensitive      >=32 RESISTANTThis is a modified FDA-approved test that has been validated and its performance characteristics determined by the reporting laboratory.  This laboratory is certified under the Clinical Laboratory Improvement Amendments CLIA as qualified to perform high complexity clinical laboratory testing.    AMPICILLIN/SULBACTAM Value in next row Sensitive      >=32 RESISTANTThis is a modified FDA-approved test that has been validated and its performance characteristics determined by the reporting laboratory.  This laboratory is certified under the Clinical Laboratory Improvement Amendments CLIA as qualified to perform high complexity clinical laboratory testing.    PIP/TAZO Value in next row Sensitive      <=4 SENSITIVEThis is a modified FDA-approved test that has been validated and its performance characteristics determined by the reporting laboratory.  This laboratory is certified under the Clinical Laboratory Improvement Amendments CLIA as qualified to perform high complexity clinical laboratory testing.    MEROPENEM Value in next row Sensitive      <=4 SENSITIVEThis is a modified FDA-approved test that has been validated and its performance characteristics determined by the reporting laboratory.  This laboratory is certified under the Clinical Laboratory Improvement Amendments CLIA as  qualified to perform high complexity clinical laboratory testing.    * >=100,000 COLONIES/mL ESCHERICHIA COLI  Culture, blood (Routine X 2) w Reflex to ID Panel     Status: None (Preliminary result)   Collection Time: 04/05/24  2:43 PM   Specimen: BLOOD RIGHT HAND  Result Value Ref Range Status   Specimen Description BLOOD RIGHT HAND  Final   Special Requests   Final    BOTTLES DRAWN AEROBIC AND ANAEROBIC Blood Culture results may not be optimal due to an inadequate volume of blood received in culture bottles   Culture   Final    NO GROWTH 3 DAYS Performed at [facility], [address], [place], [place] [address]    Report Status PENDING  Incomplete  Culture, blood (Routine X 2) w Reflex to ID Panel     Status: None (Preliminary result)   Collection Time: 04/05/24  2:45 PM   Specimen: BLOOD RIGHT HAND  Result Value Ref Range Status   Specimen Description BLOOD RIGHT HAND  Final   Special Requests   Final    BOTTLES DRAWN AEROBIC ONLY Blood Culture results may not be optimal due to an inadequate volume of blood received in culture bottles   Culture   Final    NO GROWTH 3 DAYS Performed at [facility], [address] [place], [place] [address]    Report Status PENDING  Incomplete     Serology:   Imaging: If present, new imagings (plain films, ct scans, and mri) have been personally visualized and interpreted; radiology reports have been reviewed. Decision making incorporated into the Impression / Recommendations.   Constance ONEIDA Passer, MD [facility] [phone] pager    04/08/2024, 8:36 PM     [1]  Allergies Allergen Reactions   Zestril  [Lisinopril ] Cough   "

## 2024-04-08 NOTE — Progress Notes (Addendum)
 At about 12:30 pm patient daughter reported that patient was having cp. Upon assessing patient, pt was in no apparent acute distress. Pt stated that she was having some cp that radiated to the shoulders and back. Nurse ask pt what caused the onset of pain? Pt stated when she take a deep breath is when it hurts the most. Pt heart rate was 63 bpm sustained NSR. Pt showed signs of dyspnea, pt on 3 LPM O2 via [place]. MD notified.

## 2024-04-08 NOTE — Progress Notes (Signed)
2D echo complete 

## 2024-04-08 NOTE — Progress Notes (Signed)
 At beginning of shift, patient had several periods of HR as high as 140s/150s but non sustained.  She received 50 mg PO Metoprolol  at bedtime.  .At approximately 1035, notified by pharmacy that patient was now sustaining in the high 50s to low 60s.  Patient is c/o mild SOB with mid sternal CP 5/10 with inspiration only. Her O2 level was 88% to 91 % on RA. I placed on 2 LPM [place] with Sats now 96%. Dr. Charlton made aware.  Orders received and implemented.  Suzen Ice RN

## 2024-04-08 NOTE — TOC CM/SW Note (Addendum)
 Transition of Care [facility]) - Inpatient Brief Assessment   Patient Details  Name: Cassandra Brooks MRN: [id number] Date of Birth: 1940/09/29  Transition of Care [facility]) CM/SW Contact:    Tom-Johnson, Harvest Muskrat, RN Phone Number: 04/08/2024, 12:56 PM   Clinical Narrative:  Patient presented to the ED with Body Aches, Abdominal pain, Back pain N/V/Diarrhea. Patient was recently admitted on 03/21/24 with same symptoms and discharged on 03/26/24, completed IV abx at home through the [facility] program.  Patient has hx of Hypomagnesemia, Glaucoma, DM, HTN, HLD, CAD, Degenerative Disc Disease, Hypokalemia, Depression. Admitted with Pyelonephritis, on IV abx. Found to be C-Diff positive, ID following. Benefit check done for Dificid  by [facility], co-pay will be $4.90 for 10 days.  CM spoke with patient, husband Clayburn and daughter, [name] at bedside about needs for post hospital transition. Patient lives with her husband, has three supportive children. Has necessary DME's at home, requesting for a rollator. Order placed and will be delivered to patient at bedside. Patient does not drive, husband and children transports to and from appointments. PCP is Moon, Amy A, NP and uses [facility] in [place].  Outpatient PT recommended, patient states she went to outpatient therapy at [facility] in [place] before and would like to go there again. Order and referral on AVS, order faxed to [phone]) with successful receipt noted.    Patient not Medically ready for discharge.  CM will continue to follow as patient progresses with care towards discharge.       Transition of Care Asessment: Insurance and Status: Insurance coverage has been reviewed Patient has primary care physician: Yes Home environment has been reviewed: Yes Prior level of function:: Modified Independent Prior/Current Home Services: No current home services Social Drivers of Health Review: SDOH reviewed no  interventions necessary Readmission risk has been reviewed: Yes Transition of care needs: transition of care needs identified, TOC will continue to follow

## 2024-04-08 NOTE — Progress Notes (Signed)
" °  Progress Note   Patient: Cassandra Brooks FMW:[id number] DOB: Sep 29, 1940 DOA: 04/05/2024     3 DOS: the patient was seen and examined on 04/08/2024   Brief hospital course: 83 year old female CAD cath 07/29/2022 total occlusion small posterior descending branch RCA previously aspirin  Brilinta -with complication of pain in the chest likely GI related Fibromyalgia Depression HTN HLD Known history of chronic fecal incontinence?  Overflow followed by Dr. Charlanne previously on FiberCon and Imodium    Admit 12/11-12/16 2025 progressive LBP nausea vomiting-CT ABD pelvis = right-sided pyelo-Rx Rocephin  2 g daily-diarrhea during hospital stay thought to be noninfectious patient discharged home on Amoxil -- 12/27 came in similar complaint nonbilious nonbloody diarrhea subjective fever chills CT abdomen pelvis residual right-sided pyelo blood culture X2 ordered 12/26, urine culture pending E. coli noted C. difficile also positive  Assessment and Plan:  C. difficile colitis present on admit Family reports months of diarrhea that worsened over the past several weeks Stool studies pos for cdiff Discussed with ID, appreciate recs. Recs for fidaxomycin x 10 days Recs to stop systemic abx   E. coli lower UTI ruled out Stop further abx per ID recs in the setting of cdiff No evidence of active pyelo or UTI at this time   CAD cath 2024 RCA stent Current medications include metoprolol  25 twice daily a VaPro 75 amlodipine  10 atorvastatin  80 Have held aspirin /Plavix  can resume later during hospital stay Holding hydrochlorothiazide  given concerns of dehydration in the setting of active cdiff diarrhea   Macrocytic anemia B12 2024 folic acid >20 on 03/23/2024 Needs outpatient workup-could have congenital/hereditary disease-given her age would also consider some element of myelodysplasia She has had macrocytosis since 2015   Prediabetes -A1c 6.3 Not on meds   spinal stenosis  Sinus tach with chest  pain HR as high as 160's noted, assosicated with chest pains Increased BB and added PRN labetalol  IV for HR>120 Trop 20->40, likely demand D-dimer neg EKG reviewed, nonischemic F/u on repeat limited 2d echo, pending results Will need to ensure K>4 and Mg>2. Both replaced today Pt does have a hx of GERD. Will give trial of famotidine . No PPI given cdiff   Subjective: Complains of intermittent severe localized chest pain  Physical Exam: Vitals:   04/08/24 0410 04/08/24 0541 04/08/24 0731 04/08/24 1641  BP:  (!) 145/65 (!) 149/63 (!) 164/66  Pulse:  66 65 73  Resp: 16 18 18 18   Temp:  98.1 F (36.7 C) 97.8 F (36.6 C) 98.1 F (36.7 C)  TempSrc:  Oral    SpO2:  100% 97% 95%  Weight: 80.3 kg     Height:       General exam: Conversant, in no acute distress Respiratory system: normal chest rise, clear, no audible wheezing Cardiovascular system: regular rhythm, s1-s2, reproducible localized chest pain Gastrointestinal system: Nondistended, nontender, pos BS Central nervous system: No seizures, no tremors Extremities: No cyanosis, no joint deformities Skin: No rashes, no pallor Psychiatry: Affect normal // mood seems normal   Data Reviewed:  Labs reviewed: Na 142, K 3.5, Cr 0.94, Mg 1.5, WBC 7.9, Hgb 9.6, Plts 173  Family Communication: Pt in room, family over phone  Disposition: Status is: Inpatient Remains inpatient appropriate because: severity of illness  Planned Discharge Destination: Home    Author: Garnette Pelt, MD 04/08/2024 5:32 PM  For on call review [email or web address].  "

## 2024-04-08 NOTE — Plan of Care (Signed)

## 2024-04-08 NOTE — Telephone Encounter (Signed)
 Patient Product/process Development Scientist completed.    The patient is insured through [employer]. Patient has Medicare and is not eligible for a copay card, but may be able to apply for patient assistance or Medicare RX Payment Plan (Patient Must reach out to their plan, if eligible for payment plan), if available.    Ran test claim for Dificid  200 mg and the current 10 day co-pay is $4.90.   This test claim was processed through [facility]- copay amounts may vary at other pharmacies due to pharmacy/plan contracts, or as the patient moves through the different stages of their insurance plan.     Reyes Sharps, CPHT Pharmacy Technician Patient Advocate Specialist Lead [facility] Patient Advocate Team Direct Number: [phone]  Fax: [phone]

## 2024-04-09 DIAGNOSIS — R112 Nausea with vomiting, unspecified: Secondary | ICD-10-CM | POA: Diagnosis not present

## 2024-04-09 DIAGNOSIS — R197 Diarrhea, unspecified: Secondary | ICD-10-CM | POA: Diagnosis not present

## 2024-04-09 DIAGNOSIS — N12 Tubulo-interstitial nephritis, not specified as acute or chronic: Secondary | ICD-10-CM | POA: Diagnosis not present

## 2024-04-09 LAB — GLUCOSE, CAPILLARY
Glucose-Capillary: 130 mg/dL — ABNORMAL HIGH (ref 70–99)
Glucose-Capillary: 107 mg/dL — ABNORMAL HIGH (ref 70–99)
Glucose-Capillary: 113 mg/dL — ABNORMAL HIGH (ref 70–99)
Glucose-Capillary: 138 mg/dL — ABNORMAL HIGH (ref 70–99)

## 2024-04-09 LAB — COMPREHENSIVE METABOLIC PANEL WITH GFR
ALT: 24 U/L (ref 0–44)
AST: 19 U/L (ref 15–41)
Albumin: 3.2 g/dL — ABNORMAL LOW (ref 3.5–5.0)
Alkaline Phosphatase: 73 U/L (ref 38–126)
Anion gap: 7 (ref 5–15)
BUN: 25 mg/dL — ABNORMAL HIGH (ref 8–23)
CO2: 25 mmol/L (ref 22–32)
Calcium: 8.1 mg/dL — ABNORMAL LOW (ref 8.9–10.3)
Chloride: 107 mmol/L (ref 98–111)
Creatinine, Ser: 0.95 mg/dL (ref 0.44–1.00)
GFR, Estimated: 59 mL/min — ABNORMAL LOW
Glucose, Bld: 146 mg/dL — ABNORMAL HIGH (ref 70–99)
Potassium: 4.8 mmol/L (ref 3.5–5.1)
Sodium: 139 mmol/L (ref 135–145)
Total Bilirubin: 0.3 mg/dL (ref 0.0–1.2)
Total Protein: 5.7 g/dL — ABNORMAL LOW (ref 6.5–8.1)

## 2024-04-09 LAB — CBC
HCT: 32.9 % — ABNORMAL LOW (ref 36.0–46.0)
Hemoglobin: 10.8 g/dL — ABNORMAL LOW (ref 12.0–15.0)
MCH: 35.4 pg — ABNORMAL HIGH (ref 26.0–34.0)
MCHC: 32.8 g/dL (ref 30.0–36.0)
MCV: 107.9 fL — ABNORMAL HIGH (ref 80.0–100.0)
Platelets: 192 K/uL (ref 150–400)
RBC: 3.05 MIL/uL — ABNORMAL LOW (ref 3.87–5.11)
RDW: 14 % (ref 11.5–15.5)
WBC: 8.1 K/uL (ref 4.0–10.5)
nRBC: 0.4 % — ABNORMAL HIGH (ref 0.0–0.2)

## 2024-04-09 MED ORDER — ASPIRIN 81 MG PO TBEC
81.0000 mg | DELAYED_RELEASE_TABLET | Freq: Every day | ORAL | Status: DC
  Administered 2024-04-09 – 2024-04-11 (×3): 81 mg via ORAL
  Filled 2024-04-09 (×3): qty 1

## 2024-04-09 NOTE — Plan of Care (Signed)

## 2024-04-09 NOTE — Progress Notes (Signed)
 Physical Therapy Treatment Patient Details Name: Arleene Settle MRN: [id number] DOB: Oct 07, 1940 Today's Date: 04/09/2024   History of Present Illness Pt is an 83 yo female who presents to [facility] 12/26 with c/o worsening back pain, vomiting, diarrhea, and body aches. Admitted for further workup with CT on 12/26 suggesting some degree of residual Right-sided pyelonephritis. Of note - pt with recent admission 12/11 to 12/16 for pyelonephritis. PMH includes CAD, HTN, HLD, TIA, AAA, STEMI, syncope, L TKA, lumbar spinal stenosis, DDD, fibromyalgia, glaucoma, depression, chronic fecal and urinary incontinency    PT Comments  Pt continues to report intermittent chest pain which has been on/off since Saturday with medical team aware. Vitals remained stable throughout sessio, HR 70-78. Initially on 2L O2 upon arrival, tolerated short distance ambulation on RA with stats at 90-93%. Left on 1L per patient comfort. Ongoing dizziness throughout session; education provided regarding fluid intake, slowed speed of transitions from supine>sit>stand, and allowing dizziness to subside before continuing to move. Pt will continue to benefit from skilled therapy at discharge with OPPT.    If plan is discharge home, recommend the following: Assistance with cooking/housework;Assist for transportation;Help with stairs or ramp for entrance   Can travel by private vehicle        Equipment Recommendations  None recommended by PT    Recommendations for Other Services       Precautions / Restrictions Precautions Precautions: Fall Recall of Precautions/Restrictions: Intact Precaution/Restrictions Comments: bowel urgency (wears depends) Restrictions Weight Bearing Restrictions Per Provider Order: No     Mobility  Bed Mobility Overal bed mobility: Modified Independent             General bed mobility comments: HOB elevated, reports some dizziness once seated at EOB; eduation on slowed speed of transition     Transfers Overall transfer level: Modified independent Equipment used: None               General transfer comment: completes multiple sit<>stands from EOB at MOD I, mild dizziness reported with transition; again educated on slowed speed of transition    Ambulation/Gait Ambulation/Gait assistance: Contact guard assist Gait Distance (Feet): 40 Feet (remained in room secondary to fatigue, on/off chest pain (RN aware), and bowel urgency) Assistive device: IV Pole   Gait velocity: Decreased     General Gait Details: navigated congested areas well, however in open areas noted to have increased sway and stagger steps requiring CGA for safety but no LOB; reports some ongoing dizziness with activity but states this dizziness happens at home too.   Stairs             Wheelchair Mobility     Tilt Bed    Modified Rankin (Stroke Patients Only)       Balance   Sitting-balance support: No upper extremity supported, Feet supported Sitting balance-Leahy Scale: Good Sitting balance - Comments: good seated balance seated at EOB   Standing balance support: Single extremity supported, During functional activity Standing balance-Leahy Scale: Fair                              Musician Communication: Impaired Factors Affecting Communication: Difficulty expressing self (expressive aphasia, some word finding)  Cognition Arousal: Alert Behavior During Therapy: WFL for tasks assessed/performed   PT - Cognitive impairments: No apparent impairments  Following commands: Intact      Cueing Cueing Techniques: Verbal cues  Exercises      General Comments        Pertinent Vitals/Pain Pain Assessment Pain Assessment: Faces Faces Pain Scale: Hurts little more Pain Location: chest, on/off since Saturday Pain Descriptors / Indicators: Squeezing, Spasm Pain Intervention(s): Limited activity within patient's  tolerance, Monitored during session, Repositioned    Home Living                          Prior Function            PT Goals (current goals can now be found in the care plan section) Acute Rehab PT Goals Patient Stated Goal: return home PT Goal Formulation: With patient Time For Goal Achievement: 04/20/24 Potential to Achieve Goals: Good Progress towards PT goals: Progressing toward goals    Frequency    Min 1X/week      PT Plan      Co-evaluation              AM-PAC PT 6 Clicks Mobility   Outcome Measure  Help needed turning from your back to your side while in a flat bed without using bedrails?: None Help needed moving from lying on your back to sitting on the side of a flat bed without using bedrails?: None Help needed moving to and from a bed to a chair (including a wheelchair)?: A Little Help needed standing up from a chair using your arms (e.g., wheelchair or bedside chair)?: None Help needed to walk in hospital room?: A Little Help needed climbing 3-5 steps with a railing? : A Little 6 Click Score: 21    End of Session Equipment Utilized During Treatment: Gait belt Activity Tolerance: Patient tolerated treatment well Patient left: in bed;with call bell/phone within reach;with bed alarm set Nurse Communication: Mobility status PT Visit Diagnosis: Other abnormalities of gait and mobility (R26.89)     Time: 8551-8488 PT Time Calculation (min) (ACUTE ONLY): 23 min  Charges:    $Gait Training: 8-22 mins $Therapeutic Activity: 8-22 mins                       Isaiah DEL. Elisama Thissen, PT, DPT   [employer] 04/09/2024, 4:14 PM

## 2024-04-09 NOTE — Plan of Care (Signed)
 " Problem: Coping: Goal: Ability to adjust to condition or change in health will improve 04/09/2024 0447 by Jori Roderic CROME, RN Outcome: Progressing 04/09/2024 0447 by Jori Roderic CROME, RN Outcome: Progressing   Problem: Fluid Volume: Goal: Ability to maintain a balanced intake and output will improve 04/09/2024 0447 by Jori Roderic CROME, RN Outcome: Progressing 04/09/2024 0447 by Jori Roderic CROME, RN Outcome: Progressing   Problem: Health Behavior/Discharge Planning: Goal: Ability to identify and utilize available resources and services will improve 04/09/2024 0447 by Jori Roderic CROME, RN Outcome: Progressing 04/09/2024 0447 by Jori Roderic CROME, RN Outcome: Progressing Goal: Ability to manage health-related needs will improve 04/09/2024 0447 by Jori Roderic CROME, RN Outcome: Progressing 04/09/2024 0447 by Jori Roderic CROME, RN Outcome: Progressing   Problem: Metabolic: Goal: Ability to maintain appropriate glucose levels will improve 04/09/2024 0447 by Jori Roderic CROME, RN Outcome: Progressing 04/09/2024 0447 by Jori Roderic CROME, RN Outcome: Progressing   Problem: Nutritional: Goal: Maintenance of adequate nutrition will improve 04/09/2024 0447 by Jori Roderic CROME, RN Outcome: Progressing 04/09/2024 0447 by Jori Roderic CROME, RN Outcome: Progressing Goal: Progress toward achieving an optimal weight will improve 04/09/2024 0447 by Jori Roderic CROME, RN Outcome: Progressing 04/09/2024 0447 by Jori Roderic CROME, RN Outcome: Progressing   Problem: Skin Integrity: Goal: Risk for impaired skin integrity will decrease 04/09/2024 0447 by Jori Roderic CROME, RN Outcome: Progressing 04/09/2024 0447 by Jori Roderic CROME, RN Outcome: Progressing   Problem: Tissue Perfusion: Goal: Adequacy of tissue perfusion will improve 04/09/2024 0447 by Jori Roderic CROME, RN Outcome: Progressing 04/09/2024 0447 by Jori Roderic CROME, RN Outcome: Progressing   Problem: Education: Goal: Knowledge of General Education information will improve Description: Including pain rating scale, medication(s)/side effects and non-pharmacologic comfort measures 04/09/2024 0447 by Jori Roderic CROME, RN Outcome: Progressing 04/09/2024 0447 by Jori Roderic CROME, RN Outcome: Progressing   Problem: Health Behavior/Discharge Planning: Goal: Ability to manage health-related needs will improve 04/09/2024 0447 by Jori Roderic CROME, RN Outcome: Progressing 04/09/2024 0447 by Jori Roderic CROME, RN Outcome: Progressing   Problem: Clinical Measurements: Goal: Ability to maintain clinical measurements within normal limits will improve 04/09/2024 0447 by Jori Roderic CROME, RN Outcome: Progressing 04/09/2024 0447 by Jori Roderic CROME, RN Outcome: Progressing Goal: Will remain free from infection 04/09/2024 0447 by Jori Roderic CROME, RN Outcome: Progressing 04/09/2024 0447 by Jori Roderic CROME, RN Outcome: Progressing Goal: Diagnostic test results will improve 04/09/2024 0447 by Jori Roderic CROME, RN Outcome: Progressing 04/09/2024 0447 by Jori Roderic CROME, RN Outcome: Progressing Goal: Respiratory complications will improve 04/09/2024 0447 by Jori Roderic CROME, RN Outcome: Progressing 04/09/2024 0447 by Jori Roderic CROME, RN Outcome: Progressing Goal: Cardiovascular complication will be avoided 04/09/2024 0447 by Jori Roderic CROME, RN Outcome: Progressing 04/09/2024 0447 by Jori Roderic CROME, RN Outcome: Progressing   Problem: Activity: Goal: Risk for activity intolerance will decrease 04/09/2024 0447 by Jori Roderic CROME, RN Outcome: Progressing 04/09/2024 0447 by Jori Roderic CROME, RN Outcome: Progressing   Problem: Nutrition: Goal: Adequate nutrition will be maintained 04/09/2024 0447 by Jori Roderic CROME, RN Outcome: Progressing 04/09/2024 0447 by  Jori Roderic CROME, RN Outcome: Progressing   Problem: Coping: Goal: Level of anxiety will decrease 04/09/2024 0447 by Jori Roderic CROME, RN Outcome: Progressing 04/09/2024 0447 by Jori Roderic CROME, RN Outcome: Progressing   Problem: Elimination: Goal: Will not experience complications related to bowel motility 04/09/2024 0447 by Jori Roderic CROME, RN Outcome: Progressing 04/09/2024 0447 by Jori Roderic CROME, RN Outcome: Progressing Goal: Will not  experience complications related to urinary retention 04/09/2024 0447 by Jori Roderic CROME, RN Outcome: Progressing 04/09/2024 0447 by Jori Roderic CROME, RN Outcome: Progressing   Problem: Pain Managment: Goal: General experience of comfort will improve and/or be controlled 04/09/2024 0447 by Jori Roderic CROME, RN Outcome: Progressing 04/09/2024 0447 by Jori Roderic CROME, RN Outcome: Progressing   Problem: Safety: Goal: Ability to remain free from injury will improve 04/09/2024 0447 by Jori Roderic CROME, RN Outcome: Progressing 04/09/2024 0447 by Jori Roderic CROME, RN Outcome: Progressing   Problem: Skin Integrity: Goal: Risk for impaired skin integrity will decrease 04/09/2024 0447 by Jori Roderic CROME, RN Outcome: Progressing 04/09/2024 0447 by Jori Roderic CROME, RN Outcome: Progressing   "

## 2024-04-09 NOTE — Plan of Care (Signed)
  Problem: Coping: Goal: Ability to adjust to condition or change in health will improve Outcome: Progressing   Problem: Health Behavior/Discharge Planning: Goal: Ability to identify and utilize available resources and services will improve Outcome: Progressing Goal: Ability to manage health-related needs will improve Outcome: Progressing   

## 2024-04-09 NOTE — Progress Notes (Signed)
" °  Progress Note   Patient: Cassandra Brooks FMW:[id number] DOB: [birth year] DOA: 04/05/2024     4 DOS: the patient was seen and examined on 04/09/2024   Brief hospital course: 83 year old female CAD cath 07/29/2022 total occlusion small posterior descending branch RCA previously aspirin  Brilinta -with complication of pain in the chest likely GI related Fibromyalgia Depression HTN HLD Known history of chronic fecal incontinence?  Overflow followed by Dr. Charlanne previously on FiberCon and Imodium    Admit 12/11-12/16 2025 progressive LBP nausea vomiting-CT ABD pelvis = right-sided pyelo-Rx Rocephin  2 g daily-diarrhea during hospital stay thought to be noninfectious patient discharged home on Amoxil -- 12/27 came in similar complaint nonbilious nonbloody diarrhea subjective fever chills CT abdomen pelvis residual right-sided pyelo blood culture X2 ordered 12/26, urine culture pending E. coli noted C. difficile also positive  Assessment and Plan:  C. difficile colitis present on admit Family reports months of diarrhea that worsened over the past several weeks Stool studies pos for cdiff Discussed with ID, appreciate recs. Recs for fidaxomycin x 10 days Recs to stop systemic abx. High threshold for additional systemic abx Per ID, recommend stay inpt until stool is more formed or diarrhea is <4 times in one day   E. coli lower UTI ruled out Stop further systemic abx per ID recs in the setting of cdiff No evidence of active pyelo or UTI at this time per ID   CAD cath 2024 RCA stent Current medications include metoprolol  25 twice daily a VaPro 75 amlodipine  10 atorvastatin  80 Holding hydrochlorothiazide  given concerns of dehydration in the setting of active cdiff diarrhea Hgb stable, continue asa/plavix    Macrocytic anemia B12 2024 folic acid >20 on 03/23/2024 Needs outpatient workup-could have congenital/hereditary disease-given her age would also consider some element of myelodysplasia She  has had macrocytosis since 2015   Prediabetes -A1c 6.3 Not on meds   spinal stenosis  Sinus tach with chest pain HR as high as 160's noted, assosicated with chest pains Increased BB and added PRN labetalol  IV for HR>120 Trop 20->40, likely demand D-dimer neg EKG reviewed, nonischemic repeat limited 2d echo with no wall motion, normal LVEF Will need to ensure K>4 and Mg>2.  Pt does have a hx of GERD. Started trial of famotidine . No PPI given cdiff Also ordered toradol  PRN severe chest pain   Subjective: Intermittent chest pains, received oxycodone  earlier  Physical Exam: Vitals:   04/09/24 0848 04/09/24 1203 04/09/24 1524 04/09/24 1728  BP: (!) 166/68 (!) 167/66 (!) 126/56 (!) 149/61  Pulse: 71 73 63 70  Resp: 18 18 17 18   Temp: 98.1 F (36.7 C)  97.9 F (36.6 C) 98.5 F (36.9 C)  TempSrc: Oral   Oral  SpO2: 100% 98% 94% 99%  Weight:      Height:       General exam: Awake, laying in bed, in nad Respiratory system: Normal respiratory effort, no wheezing Cardiovascular system: regular rate, s1, s2 Gastrointestinal system: Soft, nondistended, positive BS Central nervous system: CN2-12 grossly intact, strength intact Extremities: Perfused, no clubbing Skin: Normal skin turgor, no notable skin lesions seen Psychiatry: Mood normal // no visual hallucinations   Data Reviewed:  Labs reviewed: Na 139, K 4.8, Cr 0.95, WBC 8.1, Hgb 10.8, Plts 192  Family Communication: Pt in room, family at bedside  Disposition: Status is: Inpatient Remains inpatient appropriate because: severity of illness  Planned Discharge Destination: Home    Author: Garnette Pelt, MD 04/09/2024 5:35 PM  For on call review [email or web address].  "

## 2024-04-10 DIAGNOSIS — N12 Tubulo-interstitial nephritis, not specified as acute or chronic: Secondary | ICD-10-CM | POA: Diagnosis not present

## 2024-04-10 LAB — CBC
HCT: 35.2 % — ABNORMAL LOW (ref 36.0–46.0)
Hemoglobin: 11.8 g/dL — ABNORMAL LOW (ref 12.0–15.0)
MCH: 35.1 pg — ABNORMAL HIGH (ref 26.0–34.0)
MCHC: 33.5 g/dL (ref 30.0–36.0)
MCV: 104.8 fL — ABNORMAL HIGH (ref 80.0–100.0)
Platelets: 193 K/uL (ref 150–400)
RBC: 3.36 MIL/uL — ABNORMAL LOW (ref 3.87–5.11)
RDW: 13.9 % (ref 11.5–15.5)
WBC: 6.6 K/uL (ref 4.0–10.5)
nRBC: 0 % (ref 0.0–0.2)

## 2024-04-10 LAB — COMPREHENSIVE METABOLIC PANEL WITH GFR
ALT: 19 U/L (ref 0–44)
AST: 23 U/L (ref 15–41)
Albumin: 3.3 g/dL — ABNORMAL LOW (ref 3.5–5.0)
Alkaline Phosphatase: 78 U/L (ref 38–126)
Anion gap: 10 (ref 5–15)
BUN: 20 mg/dL (ref 8–23)
CO2: 24 mmol/L (ref 22–32)
Calcium: 8.8 mg/dL — ABNORMAL LOW (ref 8.9–10.3)
Chloride: 103 mmol/L (ref 98–111)
Creatinine, Ser: 0.86 mg/dL (ref 0.44–1.00)
GFR, Estimated: >60 mL/min
Glucose, Bld: 95 mg/dL (ref 70–99)
Potassium: 4.2 mmol/L (ref 3.5–5.1)
Sodium: 137 mmol/L (ref 135–145)
Total Bilirubin: 0.4 mg/dL (ref 0.0–1.2)
Total Protein: 6.1 g/dL — ABNORMAL LOW (ref 6.5–8.1)

## 2024-04-10 LAB — CULTURE, BLOOD (ROUTINE X 2)
Culture: NO GROWTH
Culture: NO GROWTH

## 2024-04-10 LAB — MAGNESIUM: Magnesium: 1.9 mg/dL (ref 1.7–2.4)

## 2024-04-10 LAB — GLUCOSE, CAPILLARY
Glucose-Capillary: 135 mg/dL — ABNORMAL HIGH (ref 70–99)
Glucose-Capillary: 114 mg/dL — ABNORMAL HIGH (ref 70–99)
Glucose-Capillary: 112 mg/dL — ABNORMAL HIGH (ref 70–99)
Glucose-Capillary: 135 mg/dL — ABNORMAL HIGH (ref 70–99)

## 2024-04-10 MED ORDER — ALBUMIN HUMAN 25 % IV SOLN
INTRAVENOUS | Status: AC
  Filled 2024-04-10: qty 200

## 2024-04-10 NOTE — Progress Notes (Addendum)
 Occupational Therapy Treatment Patient Details Name: Cassandra Brooks MRN: [id number] DOB: [birth year] Today's Date: 04/10/2024   History of present illness Pt is an 83 yo female who presents to [facility] 12/26 with c/o worsening back pain, vomiting, diarrhea, and body aches. Admitted for further workup with CT on 12/26 suggesting some degree of residual Right-sided pyelonephritis. Of note - pt with recent admission 12/11 to 12/16 for pyelonephritis. PMH includes CAD, HTN, HLD, TIA, AAA, STEMI, syncope, L TKA, lumbar spinal stenosis, DDD, fibromyalgia, glaucoma, depression, chronic fecal and urinary incontinency   OT comments  Pt supine in bed and agreeable to OT session.  Pt currently requires gross contact guard assist for Adls and mobility in room to/from bathroom.  Spo2 >98% during mobility on RA; pt continues to verbalize chest 'heaviness' with mobilization OOB and RN aware.  Discussed increased time with positional changes, limited today due to bowel urgency and liquid stool- RN aware.  Reviewed/provided energy conservation handout.Pt with decreased activity tolerance and significant fatigue with mobility to/from bathroom- therefore updated dc plan to [facility] services to optimize tolerance and return to PLOF.        If plan is discharge home, recommend the following:  A little help with bathing/dressing/bathroom;Assistance with cooking/housework;Help with stairs or ramp for entrance;Assist for transportation   Equipment Recommendations  None recommended by OT    Recommendations for Other Services      Precautions / Restrictions Precautions Precautions: Fall Recall of Precautions/Restrictions: Intact Precaution/Restrictions Comments: bowel urgency (wears depends) Restrictions Weight Bearing Restrictions Per Provider Order: No       Mobility Bed Mobility Overal bed mobility: Modified Independent                  Transfers Overall transfer level: Needs assistance Equipment used:  None Transfers: Sit to/from Stand Sit to Stand: Supervision           General transfer comment: for safety, no AD     Balance Overall balance assessment: Needs assistance Sitting-balance support: No upper extremity supported, Feet supported Sitting balance-Leahy Scale: Good     Standing balance support: Single extremity supported, During functional activity Standing balance-Leahy Scale: Fair Standing balance comment: relies on at least 1 UE support                           ADL either performed or assessed with clinical judgement   ADL Overall ADL's : Needs assistance/impaired     Grooming: Supervision/safety;Standing;Wash/dry hands               Lower Body Dressing: Contact guard assist;Sit to/from stand   Toilet Transfer: Contact guard assist;Ambulation   Toileting- Clothing Manipulation and Hygiene: Contact guard assist;Sit to/from stand;Sitting/lateral lean       Functional mobility during ADLs: Contact guard assist;Cueing for safety General ADL Comments: pt needing 1 hand held assist for mobility, mildly unsteady with poor activiyt tolerance    Extremity/Trunk Assessment              Vision       Perception     Praxis     Communication Communication Communication: Impaired Factors Affecting Communication: Difficulty expressing self (baseline expressive difficulites since CVA)   Cognition Arousal: Alert Behavior During Therapy: WFL for tasks assessed/performed Cognition: No apparent impairments                               Following  commands: Intact        Cueing   Cueing Techniques: Verbal cues  Exercises      Shoulder Instructions       General Comments VSS on RA; pt with decreased tolerance and revieiwed/provided energy conservation handout.    Pertinent Vitals/ Pain       Pain Assessment Pain Assessment: Faces Faces Pain Scale: Hurts a little bit Pain Location: chest- heaviness with mobility Pain  Descriptors / Indicators: Heaviness Pain Intervention(s): Limited activity within patient's tolerance, Monitored during session, Repositioned, Other (comment) (RN notified)  Home Living                                          Prior Functioning/Environment              Frequency  Min 1X/week        Progress Toward Goals  OT Goals(current goals can now be found in the care plan section)  Progress towards OT goals: Progressing toward goals  Acute Rehab OT Goals Patient Stated Goal: home OT Goal Formulation: With patient Time For Goal Achievement: 04/20/24 Potential to Achieve Goals: Good  Plan      Co-evaluation                 AM-PAC OT 6 Clicks Daily Activity     Outcome Measure   Help from another person eating meals?: None Help from another person taking care of personal grooming?: None Help from another person toileting, which includes using toliet, bedpan, or urinal?: A Little Help from another person bathing (including washing, rinsing, drying)?: A Little Help from another person to put on and taking off regular upper body clothing?: A Little Help from another person to put on and taking off regular lower body clothing?: A Little 6 Click Score: 20    End of Session    OT Visit Diagnosis: Other (comment);Unsteadiness on feet (R26.81) (decreased activity tolerance)   Activity Tolerance Patient tolerated treatment well   Patient Left in bed;with call bell/phone within reach;with bed alarm set;with family/visitor present   Nurse Communication Mobility status;Precautions        Time: 8781-8748 OT Time Calculation (min): 33 min  Charges: OT General Charges $OT Visit: 1 Visit OT Treatments $Self Care/Home Management : 23-37 mins  Etta NOVAK, OT Acute Rehabilitation Services Office [phone] Secure Chat Preferred    Etta GORMAN [place] 04/10/2024, 1:56 PM

## 2024-04-10 NOTE — Plan of Care (Signed)
 " Problem: Coping: Goal: Ability to adjust to condition or change in health will improve 04/10/2024 1809 by Casimir Keturah LABOR, RN Outcome: Progressing 04/10/2024 1808 by Casimir Keturah LABOR, RN Outcome: Progressing   Problem: Fluid Volume: Goal: Ability to maintain a balanced intake and output will improve 04/10/2024 1809 by Casimir Keturah LABOR, RN Outcome: Progressing 04/10/2024 1808 by Casimir Keturah LABOR, RN Outcome: Progressing   Problem: Health Behavior/Discharge Planning: Goal: Ability to identify and utilize available resources and services will improve 04/10/2024 1809 by Casimir Keturah LABOR, RN Outcome: Progressing 04/10/2024 1808 by Casimir Keturah LABOR, RN Outcome: Progressing Goal: Ability to manage health-related needs will improve 04/10/2024 1809 by Casimir Keturah LABOR, RN Outcome: Progressing 04/10/2024 1808 by Casimir Keturah LABOR, RN Outcome: Progressing   Problem: Metabolic: Goal: Ability to maintain appropriate glucose levels will improve 04/10/2024 1809 by Casimir Keturah LABOR, RN Outcome: Progressing 04/10/2024 1808 by Casimir Keturah LABOR, RN Outcome: Progressing   Problem: Nutritional: Goal: Maintenance of adequate nutrition will improve 04/10/2024 1809 by Casimir Keturah LABOR, RN Outcome: Progressing 04/10/2024 1808 by Casimir Keturah LABOR, RN Outcome: Progressing Goal: Progress toward achieving an optimal weight will improve 04/10/2024 1809 by Casimir Keturah LABOR, RN Outcome: Progressing 04/10/2024 1808 by Casimir Keturah LABOR, RN Outcome: Progressing   Problem: Skin Integrity: Goal: Risk for impaired skin integrity will decrease 04/10/2024 1809 by Casimir Keturah LABOR, RN Outcome: Progressing 04/10/2024 1808 by Casimir Keturah LABOR, RN Outcome: Progressing   Problem: Tissue Perfusion: Goal: Adequacy of tissue perfusion will improve 04/10/2024 1809 by Casimir Keturah LABOR, RN Outcome: Progressing 04/10/2024 1808 by Casimir Keturah LABOR, RN Outcome: Progressing    Problem: Education: Goal: Knowledge of General Education information will improve Description: Including pain rating scale, medication(s)/side effects and non-pharmacologic comfort measures 04/10/2024 1809 by Casimir Keturah LABOR, RN Outcome: Progressing 04/10/2024 1808 by Casimir Keturah LABOR, RN Outcome: Progressing   Problem: Health Behavior/Discharge Planning: Goal: Ability to manage health-related needs will improve 04/10/2024 1809 by Casimir Keturah LABOR, RN Outcome: Progressing 04/10/2024 1808 by Casimir Keturah LABOR, RN Outcome: Progressing   Problem: Clinical Measurements: Goal: Ability to maintain clinical measurements within normal limits will improve 04/10/2024 1809 by Casimir Keturah LABOR, RN Outcome: Progressing 04/10/2024 1808 by Casimir Keturah LABOR, RN Outcome: Progressing Goal: Will remain free from infection 04/10/2024 1809 by Casimir Keturah LABOR, RN Outcome: Progressing 04/10/2024 1808 by Casimir Keturah LABOR, RN Outcome: Progressing Goal: Diagnostic test results will improve 04/10/2024 1809 by Casimir Keturah LABOR, RN Outcome: Progressing 04/10/2024 1808 by Casimir Keturah LABOR, RN Outcome: Progressing Goal: Respiratory complications will improve 04/10/2024 1809 by Casimir Keturah LABOR, RN Outcome: Progressing 04/10/2024 1808 by Casimir Keturah LABOR, RN Outcome: Progressing Goal: Cardiovascular complication will be avoided 04/10/2024 1809 by Casimir Keturah LABOR, RN Outcome: Progressing 04/10/2024 1808 by Casimir Keturah LABOR, RN Outcome: Progressing   Problem: Activity: Goal: Risk for activity intolerance will decrease 04/10/2024 1809 by Casimir Keturah LABOR, RN Outcome: Progressing 04/10/2024 1808 by Casimir Keturah LABOR, RN Outcome: Progressing   Problem: Nutrition: Goal: Adequate nutrition will be maintained 04/10/2024 1809 by Casimir Keturah LABOR, RN Outcome: Progressing 04/10/2024 1808 by Casimir Keturah LABOR, RN Outcome: Progressing   Problem: Coping: Goal: Level  of anxiety will decrease 04/10/2024 1809 by Casimir Keturah LABOR, RN Outcome: Progressing 04/10/2024 1808 by Casimir Keturah LABOR, RN Outcome: Progressing   Problem: Elimination: Goal: Will not experience complications related to bowel motility 04/10/2024 1809 by Casimir Keturah LABOR, RN Outcome: Progressing 04/10/2024 1808 by Casimir Keturah LABOR, RN Outcome: Progressing Goal: Will not  experience complications related to urinary retention 04/10/2024 1809 by Casimir Keturah LABOR, RN Outcome: Progressing 04/10/2024 1808 by Casimir Keturah LABOR, RN Outcome: Progressing   Problem: Pain Managment: Goal: General experience of comfort will improve and/or be controlled 04/10/2024 1809 by Casimir Keturah LABOR, RN Outcome: Progressing 04/10/2024 1808 by Casimir Keturah LABOR, RN Outcome: Progressing   Problem: Safety: Goal: Ability to remain free from injury will improve 04/10/2024 1809 by Casimir Keturah LABOR, RN Outcome: Progressing 04/10/2024 1808 by Casimir Keturah LABOR, RN Outcome: Progressing   Problem: Skin Integrity: Goal: Risk for impaired skin integrity will decrease 04/10/2024 1809 by Casimir Keturah LABOR, RN Outcome: Progressing 04/10/2024 1808 by Casimir Keturah LABOR, RN Outcome: Progressing   "

## 2024-04-10 NOTE — Progress Notes (Signed)
 [facility]  Cassandra Brooks FMW:[id number]  DOB: [birth year]  DOA: 04/05/2024  PCP: Erick Greig LABOR, NP  04/10/2024,2:41 PM  LOS: 5 days    Code Status: Full code     from: Home  83 year old female CAD cath 07/29/2022 total occlusion small posterior descending branch RCA previously aspirin  Brilinta -with complication of pain in the chest likely GI related Fibromyalgia Depression HTN HLD Known history of chronic fecal incontinence?  Overflow followed by Dr. Charlanne previously on FiberCon and Imodium    Admit 12/11-12/16 2025 progressive LBP nausea vomiting-CT ABD pelvis = right-sided pyelo-Rx Rocephin  2 g daily-diarrhea during hospital stay thought to be noninfectious patient discharged home on Amoxil -- 12/27 came in similar complaint nonbilious nonbloody diarrhea subjective fever chills CT abdomen pelvis residual right-sided pyelo blood culture X2 ordered 12/26, urine culture pending E. coli noted C. difficile also positive  Had sinus tach prompting echocardiogram-echo 12/29 EF 60-65% grade 1 DD ID consulted for evaluation of infectious symptoms   Assessment  & Plan :    C. difficile colitis Continues to have more than 2 stools daily loose consistency Not a candidate for hospital at home given difficulty with self-care etc.-once stool frequency decreases a little bit more likely can discharge Continues on FIdoxymycin 12/28-04/17/2023 under ID guidance  Urine stream colonization--had pyelo-Rx Rocephin  and then amoxicillin  12/20 for 10-day course ID does not feel needs coverage of [+] urine and would not treat  Cardiac cath 2024 RCA stent Continue atorvastatin  80 Avapro  75 metoprolol  50 twice daily amlodipine  10 as well as Plavix  75 and aspirin  81 BP remains controlled--- no HCTZ in setting of diarrhea  Macrocytic anemia based on B12 folic acid-labs all normal 87/7974 recent admission Macrocytosis is chronic she needs outpatient Hb electrophoresis and/or smear etc.  Spinal stenosis Continue low-dose  baclofen  10 at bedtime as needed, continue pain meds oxycodone  5 every 4 as needed- Is on Toradol  15 IV every 8 which we will transition to oral Tylenol  for mild pain--- Flector  patch twice daily for pain  Depression Continues trazodone  25 at bedtime as needed Effexor  XR 150 Try to avoid Ativan  Sinus tachycardia Etiology is unclear but looks like SVT which self resolved Electrolyte magnesium  is okay K is okay Monitor  Data Reviewed today: Sodium 137 potassium 4.2 chloride 103 BUN/creatinine 20/0.8 albumin 3.3--WBC 6.6 hemoglobin 11.8 platelet 193   DVT prophylaxis: Heparin  Q8   Dispo/Global plan: Needs to be able to be up and out of bed moving around to take care of self with C. difficile diarrhea hopefully frequency will decrease to the point that she is able to go home     Subjective:   Awake coherent pleasant >2 stools today slightly soft-has been out of bed a little bit but not much No fever no chills eating and drinking  Objective + exam Vitals:   04/09/24 1949 04/10/24 0442 04/10/24 0452 04/10/24 0815  BP: 139/63 (!) 160/71 (!) 160/71 (!) 187/71  Pulse: 71 63 67 83  Resp: 16 17 16 18   Temp: 97.6 F (36.4 C) 98.1 F (36.7 C) 98.1 F (36.7 C)   TempSrc: Oral Oral Oral Oral  SpO2: 97% 98% 98% 96%  Weight:      Height:       Filed Weights   04/05/24 1500 04/06/24 0520 04/08/24 0410  Weight: 74.5 kg 74.6 kg 80.3 kg     Examination: EOMI NCAT no focal deficit no icterus no pallor looks about stated age Chest clear-S1-S2 no murmur no rub no gallop--abdomen soft no  rebound no guarding power 5/5 ROM intact--mild abdominal discomfort   Scheduled Meds:  acidophilus  1 capsule Oral TID WC   amLODipine   10 mg Oral Daily   aspirin  EC  81 mg Oral Daily   atorvastatin   80 mg Oral Daily   clopidogrel   75 mg Oral Daily   diclofenac   1 patch Transdermal BID   diclofenac  Sodium  2 g Topical QID   dorzolamide -timolol   1 drop Both Eyes BID   famotidine   40 mg Oral Daily    feeding supplement  237 mL Oral BID BM   fidaxomicin   200 mg Oral BID   fluticasone   2 spray Each Nare Daily   heparin   5,000 Units Subcutaneous Q8H   insulin  aspart  0-6 Units Subcutaneous TID WC   irbesartan   75 mg Oral Daily   metoprolol  tartrate  50 mg Oral BID   multivitamin with minerals  1 tablet Oral Daily   sodium chloride  flush  10-40 mL Intracatheter Q12H   sodium chloride  flush  3 mL Intravenous Q12H   sodium chloride  flush  3 mL Intravenous Q12H   venlafaxine  XR  150 mg Oral Q breakfast   Continuous Infusions: acetaminophen  **OR** acetaminophen , baclofen , hydrALAZINE , HYDROmorphone  (DILAUDID ) injection, ipratropium, ketorolac , labetalol , LORazepam , nitroGLYCERIN , ondansetron  **OR** ondansetron  (ZOFRAN ) IV, oxyCODONE , sodium chloride  flush, traZODone   Time  44  Colen Grimes, MD  [facility]

## 2024-04-11 DIAGNOSIS — N12 Tubulo-interstitial nephritis, not specified as acute or chronic: Secondary | ICD-10-CM | POA: Diagnosis not present

## 2024-04-11 LAB — GLUCOSE, CAPILLARY: Glucose-Capillary: 161 mg/dL — ABNORMAL HIGH (ref 70–99)

## 2024-04-11 MED ORDER — DICLOFENAC SODIUM 1 % EX GEL: 2.0000 g | Freq: Four times a day (QID) | CUTANEOUS | 0 refills | Status: AC

## 2024-04-11 MED ORDER — FIDAXOMICIN 200 MG PO TABS: 200.0000 mg | ORAL_TABLET | Freq: Two times a day (BID) | ORAL | 0 refills | Status: DC

## 2024-04-11 MED ORDER — DICLOFENAC EPOLAMINE 1.3 % EX PTCH: 1.0000 | MEDICATED_PATCH | Freq: Two times a day (BID) | CUTANEOUS | 0 refills | Status: AC

## 2024-04-11 MED ORDER — METOPROLOL TARTRATE 50 MG PO TABS: 50.0000 mg | ORAL_TABLET | Freq: Two times a day (BID) | ORAL | 0 refills | Status: DC

## 2024-04-11 MED ORDER — FAMOTIDINE 40 MG PO TABS: 40.0000 mg | ORAL_TABLET | Freq: Every day | ORAL | 0 refills | Status: AC

## 2024-04-11 NOTE — Progress Notes (Signed)
 Mobility Specialist: Progress Note   04/11/24 1200  Mobility  Activity Ambulated with assistance  Level of Assistance Standby assist, set-up cues, supervision of patient - no hands on  Assistive Device Front wheel walker  Distance Ambulated (ft) 200 ft  Activity Response Tolerated well  Mobility Referral Yes  Mobility visit 1 Mobility  Mobility Specialist Start Time (ACUTE ONLY) 1023  Mobility Specialist Stop Time (ACUTE ONLY) 1036  Mobility Specialist Time Calculation (min) (ACUTE ONLY) 13 min    Pt received in bed, agreeable to mobility session. SV throughout. No complaints. SpO2 93-95% on RA. Returned to room. Left in bed with all needs met, call bell in reach.   Ileana Lute Mobility Specialist Please contact via SecureChat or Rehab office at [phone]

## 2024-04-11 NOTE — Discharge Summary (Signed)
 Physician Discharge Summary  Susanne Baumgarner FMW:[id number] DOB: 07-31-1940 DOA: 04/05/2024  PCP: Erick Greig LABOR, NP  Admit date: 04/05/2024 Discharge date: 04/11/2024  Time spent: 60 minutes  Recommendations for Outpatient Follow-up:  Needs to complete fidamoxixin Needs labs Bmet CBC in about 1 week as well as needs PCP follow-up with regards to blood pressure control as well as med changes Addressed Hb electrophoresis and workup of macrocytotic anemia  Discharge Diagnoses:  MAIN problem for hospitalization   C. difficile colitis  Please see below for itemized issues addressed in HOpsital- refer to other progress notes for clarity if needed  Discharge Condition: Improved  Diet recommendation: Regular  Filed Weights   04/05/24 1500 04/06/24 0520 04/08/24 0410  Weight: 74.5 kg 74.6 kg 38.34 kg    84 year old female CAD cath 07/29/2022 total occlusion small posterior descending branch RCA previously aspirin  Brilinta -with complication of pain in the chest likely GI related Fibromyalgia Depression HTN HLD Known history of chronic fecal incontinence?  Overflow followed by Dr. Charlanne previously on FiberCon and Imodium    Admit 12/11-12/16 2025 progressive LBP nausea vomiting-CT ABD pelvis = right-sided pyelo-Rx Rocephin  2 g daily-diarrhea during hospital stay thought to be noninfectious patient discharged home on Amoxil -- 12/27 came in similar complaint nonbilious nonbloody diarrhea subjective fever chills CT abdomen pelvis residual right-sided pyelo blood culture X2 ordered 12/26, urine culture pending E. coli noted C. difficile also positive   Had sinus tach prompting echocardiogram-echo 12/29 EF 60-65% grade 1 DD ID consulted for evaluation of infectious symptoms    Assessment  & Plan :      C. difficile colitis Continues to have more than 2 stools daily loose consistency Not a candidate for hospital at home given difficulty with self-care etc.-she is stable and has a bedside  commode at home Continues on FIdoxymycin 12/28-04/17/2023 under ID guidance and I have called in the prescription and given the family a paper prescription so that they can take this to any pharmacy open No Imodium  for the time being no Protonix  for the time being and would be very careful treating what is termed as UTI in the future as previous C. difficile infections will predispose to future and I discussed this clearly with the daughter   Urine stream colonization--had pyelo-Rx Rocephin  and then amoxicillin  12/20 for 10-day course ID does not feel needs coverage of [+] urine and would not treat going forward and I have explained this multiple times to patient/family who are understanding of the same and understand the rationale for not treating colonization   Cardiac cath 2024 RCA stent Continue atorvastatin  80 Avapro  75 metoprolol  50 twice daily amlodipine  10 as well as Plavix  75 and aspirin  81 BP remains controlled--- no HCTZ in setting of diarrhea and likely can follow-up as an outpatient for adjustment of meds going forward   Macrocytic anemia based on B12 folic acid-labs all normal 87/7974 recent admission Macrocytosis is chronic she needs outpatient Hb electrophoresis and/or smear etc.   Spinal stenosis Continue low-dose baclofen  10 at bedtime as needed, continue pain meds oxycodone  5 every 4 as needed--she has meds at home Is on Toradol  15 IV every 8 which we will transition to oral Tylenol  for mild pain--- Flector  patch twice daily for pain-I have prescribed Flector  patch in addition to diclofenac  gel in case the patch is unavailable   Depression Continues trazodone  25 at bedtime as needed Effexor  XR 150 Try to avoid Ativan   Sinus tachycardia Etiology is unclear but looks like SVT which self  resolved Electrolyte magnesium  is okay K is okay -She did not have recurrence and is going home on higher dose of metoprolol  as per above was on 25 twice daily which was increased as above     Discharge Exam: Vitals:   04/11/24 0425 04/11/24 0954  BP: (!) 161/69 (!) 160/60  Pulse: 73 70  Resp: 18 20  Temp: 98.3 F (36.8 C) 98.9 F (37.2 C)  SpO2: 98% 95%    Subj on day of d/c   Looks well feels fair no distress EOMI NCAT no focal deficit  General Exam on discharge  EOMI NCAT no focal deficit no icterus no pallor no wheeze no rales no rhonchi ROM intact Power 5/5 Abdomen soft no rebound no guarding-she has a little bit of a bruise on her right arm Neurologically intact moving 4 limbs equally  Discharge Instructions   Discharge Instructions     Ambulatory referral to Physical Therapy   Complete by: As directed    Patient preferred [facility] at [address] [place], [place] [address] Phone: [phone] Fax: [phone]   Discharge instructions   Complete by: As directed    Complete the fidamoxixin as per directions you will need to complete this on 1/6 Noticed that we have discontinued your Protonix  in favor of Pepcid  to decrease the worsening of C. difficile colitis Note is also that we have increased your metoprolol  because you had a little bit of fast heart rates earlier in the admission and that is now much more stable-you do need to follow-up with your regular physician as we have also discontinued your HCTZ which is for blood pressure but which may have caused dehydration at admission I have given you the Flector  patch to put on your self and I will prescribe also the diclofenac  gel in case the patch is not affordable  Please do not use Imodium  unless absolutely necessary I would suggest using the acidophilus if you want to but really it does not make a huge difference I would suggest you get probiotics in your food as that has better evidence to help with C. difficile  Best of luck and happy new year   Increase activity slowly   Complete by: As directed       Allergies as of 04/11/2024       Reactions   Zestril  [lisinopril ]  Cough        Medication List     STOP taking these medications    acidophilus Caps capsule   feeding supplement Liqd   hydrochlorothiazide  12.5 MG tablet Commonly known as: HYDRODIURIL    loperamide  2 MG capsule Commonly known as: IMODIUM    pantoprazole  40 MG tablet Commonly known as: PROTONIX        TAKE these medications    acetaminophen  650 MG CR tablet Commonly known as: TYLENOL  Take 650 mg by mouth 2 (two) times daily.   amLODipine  10 MG tablet Commonly known as: NORVASC  Take 10 mg by mouth daily.   aspirin  EC 81 MG tablet Take 1 tablet (81 mg total) by mouth at bedtime.   atorvastatin  80 MG tablet Commonly known as: LIPITOR  Take 1 tablet (80 mg total) by mouth daily.   azelastine 0.1 % nasal spray Commonly known as: ASTELIN Place 1-2 sprays into both nostrils 2 (two) times daily as needed for rhinitis or allergies.   baclofen  10 MG tablet Commonly known as: LIORESAL  Take 1 tablet (10 mg total) by mouth at bedtime as needed for muscle spasms.  clopidogrel  75 MG tablet Commonly known as: PLAVIX  Take 1 tablet (75 mg total) by mouth daily.   diclofenac  1.3 % Ptch Commonly known as: FLECTOR  Place 1 patch onto the skin 2 (two) times daily.   diclofenac  Sodium 1 % Gel Commonly known as: VOLTAREN  Apply 2 g topically 4 (four) times daily.   dorzolamide -timolol  2-0.5 % ophthalmic solution Commonly known as: COSOPT  Place 1 drop into both eyes 2 (two) times daily.   famotidine  40 MG tablet Commonly known as: PEPCID  Take 1 tablet (40 mg total) by mouth daily. Start taking on: April 12, 2024   fidaxomicin  200 MG Tabs tablet Commonly known as: DIFICID  Take 1 tablet (200 mg total) by mouth 2 (two) times daily for 5 days.   fluticasone  50 MCG/ACT nasal spray Commonly known as: FLONASE  Place 2 sprays into both nostrils daily.   metoprolol  tartrate 50 MG tablet Commonly known as: LOPRESSOR  Take 1 tablet (50 mg total) by mouth 2 (two) times  daily. What changed:  medication strength how much to take   Multivitamin Women 50+ Tabs Take 1 tablet by mouth daily.   nabumetone  750 MG tablet Commonly known as: RELAFEN  Take 750 mg by mouth 2 (two) times daily.   nitroGLYCERIN  0.4 MG SL tablet Commonly known as: NITROSTAT  Place 0.4 mg under the tongue every 5 (five) minutes as needed for chest pain.   ondansetron  4 MG disintegrating tablet Commonly known as: ZOFRAN -ODT Take 1 tablet (4 mg total) by mouth every 4 (four) hours as needed.   PROBIOTIC PO Take 1 capsule by mouth daily.   Rocklatan  0.02-0.005 % Soln Generic drug: Netarsudil -Latanoprost  Apply 1 drop to eye at bedtime.   valsartan  160 MG tablet Commonly known as: Diovan  Take 1 tablet (160 mg total) by mouth at bedtime.   venlafaxine  XR 150 MG 24 hr capsule Commonly known as: EFFEXOR -XR Take 1 capsule (150 mg total) by mouth daily with breakfast.               Durable Medical Equipment  (From admission, onward)           Start     Ordered   04/08/24 1318  For home use only DME 4 wheeled rolling walker with seat  Once       Question:  Patient needs a walker to treat with the following condition  Answer:  Gait instability   04/08/24 1317           Allergies[1]  Follow-up Information     [facility] Follow up.   Why: Call to schedule your first appointment. Contact information: [address] [place] [address] [phone]                  The results of significant diagnostics from this hospitalization (including imaging, microbiology, ancillary and laboratory) are listed below for reference.    Significant Diagnostic Studies: ECHOCARDIOGRAM COMPLETE Result Date: 04/08/2024    ECHOCARDIOGRAM REPORT   Patient Name:   ROSE HEGNER Date of Exam: 04/08/2024 Medical Rec #:  [id number]        Height:       61.0 in Accession #:    [id number]       Weight:       177.0 lb Date of Birth:  May 19, 1940         BSA:          1.793 m Patient Age:    21 years  BP:           149/63 mmHg Patient Gender: F                HR:           71 bpm. Exam Location:  Inpatient Procedure: 2D Echo, Cardiac Doppler and Color Doppler (Both Spectral and Color            Flow Doppler were utilized during procedure). Indications:    Chest Pain R07.9  History:        Patient has prior history of Echocardiogram examinations, most                 recent 08/04/2023. Risk Factors:Diabetes and Hypertension.  Sonographer:    Sydnee Wilson RDCS Referring Phys: [phone] STEPHEN K CHIU IMPRESSIONS  1. Left ventricular ejection fraction, by estimation, is 60 to 65%. The left ventricle has normal function. The left ventricle has no regional wall motion abnormalities. Left ventricular diastolic parameters are consistent with Grade I diastolic dysfunction (impaired relaxation).  2. Right ventricular systolic function is normal. The right ventricular size is normal. There is moderately elevated pulmonary artery systolic pressure. The estimated right ventricular systolic pressure is 47.1 mmHg.  3. Left atrial size was mildly dilated.  4. The mitral valve is normal in structure. Mild mitral valve regurgitation. No evidence of mitral stenosis.  5. The aortic valve is normal in structure. Aortic valve regurgitation is not visualized. No aortic stenosis is present.  6. The inferior vena cava is normal in size with greater than 50% respiratory variability, suggesting right atrial pressure of 3 mmHg. FINDINGS  Left Ventricle: Left ventricular ejection fraction, by estimation, is 60 to 65%. The left ventricle has normal function. The left ventricle has no regional wall motion abnormalities. The left ventricular internal cavity size was normal in size. There is  no left ventricular hypertrophy. Left ventricular diastolic parameters are consistent with Grade I diastolic dysfunction (impaired relaxation). Right Ventricle: The right ventricular size is normal. No  increase in right ventricular wall thickness. Right ventricular systolic function is normal. There is moderately elevated pulmonary artery systolic pressure. The tricuspid regurgitant velocity is 3.32 m/s, and with an assumed right atrial pressure of 3 mmHg, the estimated right ventricular systolic pressure is 47.1 mmHg. Left Atrium: Left atrial size was mildly dilated. Right Atrium: Right atrial size was normal in size. Pericardium: There is no evidence of pericardial effusion. Mitral Valve: The mitral valve is normal in structure. Mild mitral annular calcification. Mild mitral valve regurgitation. No evidence of mitral valve stenosis. Tricuspid Valve: The tricuspid valve is normal in structure. Tricuspid valve regurgitation is mild . No evidence of tricuspid stenosis. Aortic Valve: The aortic valve is normal in structure. Aortic valve regurgitation is not visualized. No aortic stenosis is present. Aortic valve mean gradient measures 3.0 mmHg. Aortic valve peak gradient measures 6.2 mmHg. Aortic valve area, by VTI measures 2.47 cm. Pulmonic Valve: The pulmonic valve was normal in structure. Pulmonic valve regurgitation is mild. No evidence of pulmonic stenosis. Aorta: The aortic root is normal in size and structure. Venous: The inferior vena cava is normal in size with greater than 50% respiratory variability, suggesting right atrial pressure of 3 mmHg. IAS/Shunts: No atrial level shunt detected by color flow Doppler.  LEFT VENTRICLE PLAX 2D LVIDd:         3.80 cm     Diastology LVIDs:         2.50 cm  LV e' medial:    12.10 cm/s LV PW:         1.20 cm     LV E/e' medial:  9.1 LV IVS:        0.90 cm     LV e' lateral:   12.50 cm/s LVOT diam:     2.00 cm     LV E/e' lateral: 8.8 LV SV:         78 LV SV Index:   44 LVOT Area:     3.14 cm  LV Volumes (MOD) LV vol d, MOD A2C: 69.0 ml LV vol d, MOD A4C: 69.2 ml LV vol s, MOD A2C: 20.0 ml LV vol s, MOD A4C: 28.8 ml LV SV MOD A2C:     49.0 ml LV SV MOD A4C:     69.2  ml LV SV MOD BP:      48.7 ml RIGHT VENTRICLE RV S prime:     15.90 cm/s TAPSE (M-mode): 2.6 cm LEFT ATRIUM             Index        RIGHT ATRIUM           Index LA diam:        4.00 cm 2.23 cm/m   RA Area:     12.40 cm LA Vol (A2C):   61.7 ml 34.41 ml/m  RA Volume:   26.40 ml  14.72 ml/m LA Vol (A4C):   50.3 ml 28.05 ml/m LA Biplane Vol: 60.6 ml 33.79 ml/m  AORTIC VALVE AV Area (Vmax):    2.45 cm AV Area (Vmean):   2.57 cm AV Area (VTI):     2.47 cm AV Vmax:           125.00 cm/s AV Vmean:          87.800 cm/s AV VTI:            0.317 m AV Peak Grad:      6.2 mmHg AV Mean Grad:      3.0 mmHg LVOT Vmax:         97.40 cm/s LVOT Vmean:        71.700 cm/s LVOT VTI:          0.249 m LVOT/AV VTI ratio: 0.79  AORTA Ao Root diam: 3.10 cm Ao Asc diam:  2.50 cm MITRAL VALVE                TRICUSPID VALVE MV Area (PHT): 2.86 cm     TR Peak grad:   44.1 mmHg MV Decel Time: 265 msec     TR Vmax:        332.00 cm/s MV E velocity: 110.00 cm/s MV A velocity: 51.90 cm/s   SHUNTS MV E/A ratio:  2.12         Systemic VTI:  0.25 m                             Systemic Diam: 2.00 cm Morene Brownie Electronically signed by Morene Brownie Signature Date/Time: 04/08/2024/6:10:21 PM    Final    DG Chest 2 View Result Date: 04/08/2024 EXAM: 2 VIEW(S) XRAY OF THE CHEST 04/08/2024 03:43:41 AM COMPARISON: Portable chest dated 04/07/2024 06:23 PM. CLINICAL HISTORY: Chest pain, SOB (shortness of breath). FINDINGS: LUNGS AND PLEURA: There is interval clearing of the right infrahilar area. Patchy airspace disease persists in the left lower lung field. Small pleural effusions  are unchanged. No pneumothorax. HEART AND MEDIASTINUM: There is cardiomegaly with perihilar vascular congestion and mild central edema, unchanged . The mediastinum is stable. Aortic atherosclerosis. BONES AND SOFT TISSUES: No acute osseous abnormality. IMPRESSION: 1. Cardiomegaly with perihilar vascular congestion and mild central edema, unchanged . 2. Patchy  airspace disease persists in the left lower lung field. 3. Small pleural effusions are unchanged. 4. Interval clearing of the right infrahilar area. Electronically signed by: Francis Quam MD 04/08/2024 04:11 AM EST RP Workstation: [id number]   DG CHEST PORT 1 VIEW Result Date: 04/07/2024 EXAM: 1 VIEW(S) XRAY OF THE CHEST 04/07/2024 06:26:00 PM COMPARISON: Chest x-ray 08/01/2022. CLINICAL HISTORY: Chest pain. FINDINGS: LUNGS AND PLEURA: Low lung volumes. Bilateral interstitial prominence with patchy airspace opacities of the right lower lung zone and possibly retrocardiac region. No pleural effusion. No pneumothorax. HEART AND MEDIASTINUM: Aortic atherosclerosis. No acute abnormality of the cardiac and mediastinal silhouettes. BONES AND SOFT TISSUES: No acute osseous abnormality. IMPRESSION: 1. Bilateral interstitial prominence with patchy airspace opacities in the right lower lung zone and possibly retrocardiac region. 2. Low lung volumes. 3. Consider repeat PA and lateral view of the chest with improved inspiratory effort for further evaluation. Electronically signed by: Morgane Naveau MD 04/07/2024 07:10 PM EST RP Workstation: [id number]   CT ABDOMEN PELVIS W CONTRAST Result Date: 04/05/2024 CLINICAL DATA:  Epigastric pain. EXAM: CT ABDOMEN AND PELVIS WITH CONTRAST TECHNIQUE: Multidetector CT imaging of the abdomen and pelvis was performed using the standard protocol following bolus administration of intravenous contrast. RADIATION DOSE REDUCTION: This exam was performed according to the departmental dose-optimization program which includes automated exposure control, adjustment of the mA and/or kV according to patient size and/or use of iterative reconstruction technique. CONTRAST:  75mL OMNIPAQUE  IOHEXOL  350 MG/ML SOLN COMPARISON:  Fifteen days ago FINDINGS: Lower chest: No acute abnormality. Hepatobiliary: No focal liver abnormality is seen. No gallstones, gallbladder wall thickening, or biliary  dilatation. Pancreas: Unremarkable. No pancreatic ductal dilatation or surrounding inflammatory changes. Spleen: Normal in size without focal abnormality. Adrenals/Urinary Tract: Adrenal glands appear normal. No hydronephrosis or renal obstruction is noted. Left renal cortical scarring is noted. No renal or ureteral calculi are noted. Bladder is unremarkable. There are persistent low density areas involving the right renal parenchyma on delayed post-contrast imaging suggesting some degree residual right-sided pyelonephritis. Stomach/Bowel: Stomach is unremarkable. There is no evidence of bowel obstruction or inflammation. Sigmoid diverticulosis without inflammation. Status post appendectomy. Vascular/Lymphatic: Aortic atherosclerosis. No enlarged abdominal or pelvic lymph nodes. Reproductive: Status post hysterectomy. No adnexal masses. Other: Stable round calcification seen in periumbilical region which may represent postsurgical change. No ascites is noted. Musculoskeletal: No acute or significant osseous findings. IMPRESSION: 1. Persistent low density areas are noted in right renal parenchyma on delayed post-contrast imaging suggesting some degree of residual right-sided pyelonephritis as noted on prior exam 15 days ago. 2. Sigmoid diverticulosis without inflammation. 3. Aortic atherosclerosis. Aortic Atherosclerosis (ICD10-I70.0).  1 Electronically Signed   By: Lynwood Landy Raddle M.D.   On: 04/05/2024 09:59   MR LUMBAR SPINE WO CONTRAST Result Date: 04/05/2024 EXAM: MRI LUMBAR SPINE 04/05/2024 09:22:06 AM TECHNIQUE: Multiplanar multisequence MRI of the lumbar spine was performed without the administration of intravenous contrast. COMPARISON: MRI lumbar spine 07/17/2022. CLINICAL HISTORY: Low back pain, cauda equina syndrome suspected. FINDINGS: BONES AND ALIGNMENT: Conventional lumbosacral anatomy is assumed with 5 non-rib-bearing, lumbar-type vertebral bodies. Unchanged 4 mm retrolisthesis of L2 on L3. Normal  vertebral body heights. Modic type 1 degenerative endplate marrow signal changes  at L2-L3 and L3-L4. Unchanged postoperative appearance from prior L2-L3 laminectomy. SPINAL CORD: The conus terminates at L2. SOFT TISSUES: No paraspinal mass. L1-L2: Small disc bulge and mild bilateral facet arthropathy without spinal canal stenosis or neural foraminal narrowing. L2-L3: Prior laminectomy. No spinal canal stenosis. Disc bulge and facet arthropathy contribute to moderate right neural foraminal narrowing. L3-L4: Prior laminectomy. No spinal canal stenosis. Disc bulge and facet arthropathy contribute to severe left and moderate right neural foraminal narrowing. L4-L5: Disc bulge and moderate bilateral facet arthropathy result in moderate narrowing of the bilateral subarticular zones, right greater than left with mass effect on the traversing bilateral L5 nerve roots. Moderate right neural foraminal narrowing. L5-S1: Left foraminal disc protrusion and facet arthropathy contribute to moderate left neural foraminal narrowing. No spinal canal stenosis. IMPRESSION: 1. Unchanged postoperative appearance from prior L2-3 and L3-4 laminectomies. 2. At L4-5, moderate bilateral subarticular zone narrowing (right greater than left) with mass effect on the traversing bilateral L5 nerve roots, and moderate right neural foraminal narrowing. 3. Multilevel neural foraminal narrowing, worst at L3-4 with severe left and moderate right narrowing. Electronically signed by: Ryan Chess MD 04/05/2024 09:31 AM EST RP Workstation: [id number]   CT ABDOMEN PELVIS W CONTRAST Result Date: 03/21/2024 CLINICAL DATA:  Abdominal pain. EXAM: CT ABDOMEN AND PELVIS WITH CONTRAST TECHNIQUE: Multidetector CT imaging of the abdomen and pelvis was performed using the standard protocol following bolus administration of intravenous contrast. RADIATION DOSE REDUCTION: This exam was performed according to the departmental dose-optimization program which  includes automated exposure control, adjustment of the mA and/or kV according to patient size and/or use of iterative reconstruction technique. CONTRAST:  75mL OMNIPAQUE  IOHEXOL  350 MG/ML SOLN COMPARISON:  CT abdomen pelvis dated 08/26/2013. FINDINGS: Lower chest: No acute abnormality. No intra-abdominal free air or free fluid. Hepatobiliary: The liver is unremarkable. No biliary dilatation. The gallbladder is unremarkable Pancreas: Unremarkable. No pancreatic ductal dilatation or surrounding inflammatory changes. Spleen: Normal in size without focal abnormality. Adrenals/Urinary Tract: The adrenal glands unremarkable there is no hydronephrosis on either side. Faint areas heterogeneous hypoenhancement of the upper pole of the right kidney concerning for pyelonephritis. Correlation with urinalysis recommended. No abscess. The visualized ureters and urinary bladder unremarkable. Stomach/Bowel: Residual food bolus noted in the distal esophagus. There is no bowel obstruction or active inflammation. Mild sigmoid diverticulosis. Appendectomy. Vascular/Lymphatic: Moderate aortoiliac atherosclerotic disease. The IVC is unremarkable. No portal venous gas. There is no adenopathy. Reproductive: Hysterectomy.  No suspicious adnexal masses. Other: A 2.3 x 2.3 cm partially calcified lesion in the subcutaneous soft tissues of the anterior lower abdominal wall in the midline, likely sequela of post treatment changes or related to prior fat necrosis. Musculoskeletal: Osteopenia with degenerative changes. No acute osseous pathology. L2-L3 laminectomy. IMPRESSION: 1. Findings concerning for right-sided pyelonephritis. Correlation with urinalysis recommended. No abscess. 2. Sigmoid diverticulosis.  No bowel obstruction. 3.  Aortic Atherosclerosis (ICD10-I70.0). Electronically Signed   By: Vanetta Chou M.D.   On: 03/21/2024 19:17   CT L-SPINE NO CHARGE Result Date: 03/21/2024 CLINICAL DATA:  Back pain. EXAM: CT LUMBAR SPINE  WITHOUT CONTRAST TECHNIQUE: Multidetector CT imaging of the lumbar spine was performed without intravenous contrast administration. Multiplanar CT image reconstructions were also generated. RADIATION DOSE REDUCTION: This exam was performed according to the departmental dose-optimization program which includes automated exposure control, adjustment of the mA and/or kV according to patient size and/or use of iterative reconstruction technique. COMPARISON:  CT abdomen pelvis dated 08/26/2013. FINDINGS: Segmentation: 5 lumbar type vertebrae. Alignment: No  acute subluxation.  Grade 1 L2-L3 retrolisthesis. Vertebrae: No acute fracture. The bones are osteopenic. L2-L3 laminectomy. Paraspinal and other soft tissues: Negative. Disc levels: Extensive multilevel degenerative changes with disc desiccation and vacuum phenomena. Multilevel disc space narrowing and endplate irregularity. IMPRESSION: 1. No acute/traumatic lumbar spine pathology. 2. Extensive multilevel degenerative changes. Electronically Signed   By: Vanetta Chou M.D.   On: 03/21/2024 18:58    Microbiology: Recent Results (from the past 240 hours)  Resp panel by RT-PCR (RSV, Flu A&B, Covid) Anterior Nasal Swab     Status: None   Collection Time: 04/05/24  8:27 AM   Specimen: Anterior Nasal Swab  Result Value Ref Range Status   SARS Coronavirus 2 by RT PCR NEGATIVE NEGATIVE Final   Influenza A by PCR NEGATIVE NEGATIVE Final   Influenza B by PCR NEGATIVE NEGATIVE Final    Comment: (NOTE) The Xpert Xpress SARS-CoV-2/FLU/RSV plus assay is intended as an aid in the diagnosis of influenza from Nasopharyngeal swab specimens and should not be used as a sole basis for treatment. Nasal washings and aspirates are unacceptable for Xpert Xpress SARS-CoV-2/FLU/RSV testing.  Fact Sheet for Patients: bloggercourse.com  Fact Sheet for Healthcare Providers: seriousbroker.it  This test is not yet approved  or cleared by the [place]  FDA and has been authorized for detection and/or diagnosis of SARS-CoV-2 by FDA under an Emergency Use Authorization (EUA). This EUA will remain in effect (meaning this test can be used) for the duration of the COVID-19 declaration under Section 564(b)(1) of the Act, 21 U.S.C. section 360bbb-3(b)(1), unless the authorization is terminated or revoked.     Resp Syncytial Virus by PCR NEGATIVE NEGATIVE Final    Comment: (NOTE) Fact Sheet for Patients: bloggercourse.com  Fact Sheet for Healthcare Providers: seriousbroker.it  This test is not yet approved or cleared by the [place]  FDA and has been authorized for detection and/or diagnosis of SARS-CoV-2 by FDA under an Emergency Use Authorization (EUA). This EUA will remain in effect (meaning this test can be used) for the duration of the COVID-19 declaration under Section 564(b)(1) of the Act, 21 U.S.C. section 360bbb-3(b)(1), unless the authorization is terminated or revoked.  Performed at [facility], [address], [place], [place] [address]   C Difficile Quick Screen w PCR reflex     Status: Abnormal   Collection Time: 04/05/24 11:10 AM   Specimen: STOOL  Result Value Ref Range Status   C Diff antigen POSITIVE (A) NEGATIVE Final   C Diff toxin POSITIVE (A) NEGATIVE Final    Comment: RESULT CALLED TO, READ BACK BY AND VERIFIED WITH: RN Wanda HERO on [phone] @2018  by SM    C Diff interpretation Toxin producing C. difficile detected.  Final    Comment: Performed at [facility], [address], [place], [place] [address]  Urine Culture     Status: Abnormal   Collection Time: 04/05/24 11:58 AM   Specimen: Urine, Random  Result Value Ref Range Status   Specimen Description URINE, RANDOM  Final   Special Requests   Final    NONE Reflexed from [id number] Performed at [facility], [address] [place], [place] [address]     Culture (A)  Final    >=100,000 COLONIES/mL ESCHERICHIA COLI Confirmed Extended Spectrum Beta-Lactamase Producer (ESBL).  In bloodstream infections from ESBL organisms, carbapenems are preferred over piperacillin/tazobactam. They are shown to have a lower risk of mortality.    Report Status 04/07/2024 FINAL  Final   Organism ID, Bacteria ESCHERICHIA COLI (A)  Final      Susceptibility   Escherichia coli - MIC*    AMPICILLIN >=32 RESISTANT Resistant     CEFAZOLIN  (URINE) Value in next row Resistant      >=32 RESISTANTThis is a modified FDA-approved test that has been validated and its performance characteristics determined by the reporting laboratory.  This laboratory is certified under the Clinical Laboratory Improvement Amendments CLIA as qualified to perform high complexity clinical laboratory testing.    CEFEPIME Value in next row Resistant      >=32 RESISTANTThis is a modified FDA-approved test that has been validated and its performance characteristics determined by the reporting laboratory.  This laboratory is certified under the Clinical Laboratory Improvement Amendments CLIA as qualified to perform high complexity clinical laboratory testing.    ERTAPENEM Value in next row Sensitive      >=32 RESISTANTThis is a modified FDA-approved test that has been validated and its performance characteristics determined by the reporting laboratory.  This laboratory is certified under the Clinical Laboratory Improvement Amendments CLIA as qualified to perform high complexity clinical laboratory testing.    CEFTRIAXONE  Value in next row Resistant      >=32 RESISTANTThis is a modified FDA-approved test that has been validated and its performance characteristics determined by the reporting laboratory.  This laboratory is certified under the Clinical Laboratory Improvement Amendments CLIA as qualified to perform high complexity clinical laboratory testing.    CIPROFLOXACIN Value in next row Resistant       >=32 RESISTANTThis is a modified FDA-approved test that has been validated and its performance characteristics determined by the reporting laboratory.  This laboratory is certified under the Clinical Laboratory Improvement Amendments CLIA as qualified to perform high complexity clinical laboratory testing.    GENTAMICIN Value in next row Sensitive      >=32 RESISTANTThis is a modified FDA-approved test that has been validated and its performance characteristics determined by the reporting laboratory.  This laboratory is certified under the Clinical Laboratory Improvement Amendments CLIA as qualified to perform high complexity clinical laboratory testing.    NITROFURANTOIN Value in next row Sensitive      >=32 RESISTANTThis is a modified FDA-approved test that has been validated and its performance characteristics determined by the reporting laboratory.  This laboratory is certified under the Clinical Laboratory Improvement Amendments CLIA as qualified to perform high complexity clinical laboratory testing.    TRIMETH/SULFA Value in next row Sensitive      >=32 RESISTANTThis is a modified FDA-approved test that has been validated and its performance characteristics determined by the reporting laboratory.  This laboratory is certified under the Clinical Laboratory Improvement Amendments CLIA as qualified to perform high complexity clinical laboratory testing.    AMPICILLIN/SULBACTAM Value in next row Sensitive      >=32 RESISTANTThis is a modified FDA-approved test that has been validated and its performance characteristics determined by the reporting laboratory.  This laboratory is certified under the Clinical Laboratory Improvement Amendments CLIA as qualified to perform high complexity clinical laboratory testing.    PIP/TAZO Value in next row Sensitive      <=4 SENSITIVEThis is a modified FDA-approved test that has been validated and its performance characteristics determined by the reporting laboratory.   This laboratory is certified under the Clinical Laboratory Improvement Amendments CLIA as qualified to perform high complexity clinical laboratory testing.    MEROPENEM Value in next row Sensitive      <=4 SENSITIVEThis  is a modified FDA-approved test that has been validated and its performance characteristics determined by the reporting laboratory.  This laboratory is certified under the Clinical Laboratory Improvement Amendments CLIA as qualified to perform high complexity clinical laboratory testing.    * >=100,000 COLONIES/mL ESCHERICHIA COLI  Culture, blood (Routine X 2) w Reflex to ID Panel     Status: None   Collection Time: 04/05/24  2:43 PM   Specimen: BLOOD RIGHT HAND  Result Value Ref Range Status   Specimen Description BLOOD RIGHT HAND  Final   Special Requests   Final    BOTTLES DRAWN AEROBIC AND ANAEROBIC Blood Culture results may not be optimal due to an inadequate volume of blood received in culture bottles   Culture   Final    NO GROWTH 5 DAYS Performed at [facility], [address] [place], [place] [address]    Report Status 04/10/2024 FINAL  Final  Culture, blood (Routine X 2) w Reflex to ID Panel     Status: None   Collection Time: 04/05/24  2:45 PM   Specimen: BLOOD RIGHT HAND  Result Value Ref Range Status   Specimen Description BLOOD RIGHT HAND  Final   Special Requests   Final    BOTTLES DRAWN AEROBIC ONLY Blood Culture results may not be optimal due to an inadequate volume of blood received in culture bottles   Culture   Final    NO GROWTH 5 DAYS Performed at [facility], [address], [place], [place] [address]    Report Status 04/10/2024 FINAL  Final     Labs: Basic Metabolic Panel: Recent Labs  Lab 04/05/24 0750 04/05/24 1443 04/06/24 0248 04/08/24 0350 04/09/24 0053 04/10/24 1401  NA 138  --  136 142 139 137  K 3.9  --  4.4 3.5 4.8 4.2  CL 103  --  103 109 107 103  CO2 22  --  22 23 25 24   GLUCOSE 198*  --  174* 107* 146* 95   BUN 21  --  24* 26* 25* 20  CREATININE 1.09*  --  1.06* 0.94 0.95 0.86  CALCIUM  9.3  --  8.6* 8.0* 8.1* 8.8*  MG  --  1.6*  --  1.5*  --  1.9  PHOS  --  3.2  --   --   --   --    Liver Function Tests: Recent Labs  Lab 04/05/24 0750 04/06/24 0248 04/08/24 0350 04/09/24 0053 04/10/24 1401  AST 26 21 27 19 23   ALT 21 17 26 24 19   ALKPHOS 89 66 62 73 78  BILITOT 0.4 0.2 0.2 0.3 0.4  PROT 7.1 6.2* 5.3* 5.7* 6.1*  ALBUMIN 3.9 3.3* 3.0* 3.2* 3.3*   Recent Labs  Lab 04/05/24 0750  LIPASE 19   No results for input(s): AMMONIA in the last 168 hours. CBC: Recent Labs  Lab 04/05/24 1443 04/06/24 0248 04/08/24 0350 04/09/24 0053 04/10/24 1401  WBC 5.7 8.2 7.9 8.1 6.6  HGB 12.2 11.3* 9.6* 10.8* 11.8*  HCT 36.7 34.6* 29.7* 32.9* 35.2*  MCV 104.0* 106.1* 108.8* 107.9* 104.8*  PLT 241 223 173 192 193   Cardiac Enzymes: No results for input(s): CKTOTAL, CKMB, CKMBINDEX, TROPONINI in the last 168 hours. BNP: BNP (last 3 results) No results for input(s): BNP in the last 8760 hours.  ProBNP (last 3 results) No results for input(s): PROBNP in the last 8760 hours.  CBG: Recent Labs  Lab 04/10/24 0815  04/10/24 1146 04/10/24 1709 04/10/24 2124 04/11/24 0805  GLUCAP 135* 114* 112* 135* 161*    Signed:  Colen Grimes MD   [facility] 04/11/2024, 11:20 AM      [1]  Allergies Allergen Reactions   Zestril  [Lisinopril ] Cough

## 2024-04-11 NOTE — TOC Transition Note (Addendum)
 Transition of Care [facility]) - Discharge Note   Patient Details  Name: Tunisia Landgrebe MRN: [id number] Date of Birth: April 07, 1941  [facility] CM/SW Contact:  Waddell Barnie Rama, RN Phone Number: 04/11/2024, 11:46 AM   Clinical Narrative:    For dc today, per previous NCM note, she is set up with OP PT.  Per the Staff RN, patient wants HHPT, and she needs a rollator.  NCM made referral to [employer] for rollatorm , daughter is ok with [employer].  This will be delivered to room in about 20 minutes. NCM spoke with daughter who was at the bedside, she states they would like [facility]. Will send referral thru portal. [facility] has accepted.           Patient Goals and CMS Choice            Discharge Placement                       Discharge Plan and Services Additional resources added to the After Visit Summary for                                       Social Drivers of Health (SDOH) Interventions SDOH Screenings   Food Insecurity: No Food Insecurity (04/05/2024)  Housing: Low Risk (04/05/2024)  Transportation Needs: No Transportation Needs (04/05/2024)  Utilities: Not At Risk (04/05/2024)  Social Connections: Socially Integrated (04/05/2024)  Tobacco Use: Low Risk (04/05/2024)     Readmission Risk Interventions     No data to display

## 2024-04-11 NOTE — Progress Notes (Signed)
 Discharge instructions provided. IV out. Monitor off CCMD notified. All medications, follow up appointments, and discharge instructions provided. Discharging home with husband.

## 2024-04-14 ENCOUNTER — Inpatient Hospital Stay (HOSPITAL_COMMUNITY)
Admission: EM | Admit: 2024-04-14 | Discharge: 2024-04-18 | DRG: 357 | Disposition: A | Discharge status: Home / Self Care | Attending: Family Medicine | Admitting: Family Medicine

## 2024-04-14 ENCOUNTER — Other Ambulatory Visit

## 2024-04-14 ENCOUNTER — Encounter (HOSPITAL_COMMUNITY): Payer: Self-pay

## 2024-04-14 ENCOUNTER — Emergency Department (HOSPITAL_COMMUNITY)

## 2024-04-14 ENCOUNTER — Other Ambulatory Visit: Payer: Self-pay

## 2024-04-14 DIAGNOSIS — D539 Nutritional anemia, unspecified: Secondary | ICD-10-CM | POA: Diagnosis present

## 2024-04-14 DIAGNOSIS — H409 Unspecified glaucoma: Secondary | ICD-10-CM | POA: Diagnosis present

## 2024-04-14 DIAGNOSIS — I714 Abdominal aortic aneurysm, without rupture, unspecified: Secondary | ICD-10-CM | POA: Diagnosis present

## 2024-04-14 DIAGNOSIS — Z8673 Personal history of transient ischemic attack (TIA), and cerebral infarction without residual deficits: Secondary | ICD-10-CM | POA: Diagnosis not present

## 2024-04-14 DIAGNOSIS — I2510 Atherosclerotic heart disease of native coronary artery without angina pectoris: Secondary | ICD-10-CM | POA: Diagnosis present

## 2024-04-14 DIAGNOSIS — R112 Nausea with vomiting, unspecified: Principal | ICD-10-CM | POA: Diagnosis present

## 2024-04-14 DIAGNOSIS — M48062 Spinal stenosis, lumbar region with neurogenic claudication: Secondary | ICD-10-CM | POA: Diagnosis present

## 2024-04-14 DIAGNOSIS — E119 Type 2 diabetes mellitus without complications: Secondary | ICD-10-CM | POA: Diagnosis present

## 2024-04-14 DIAGNOSIS — Z1152 Encounter for screening for COVID-19: Secondary | ICD-10-CM

## 2024-04-14 DIAGNOSIS — K219 Gastro-esophageal reflux disease without esophagitis: Secondary | ICD-10-CM | POA: Diagnosis present

## 2024-04-14 DIAGNOSIS — Z8249 Family history of ischemic heart disease and other diseases of the circulatory system: Secondary | ICD-10-CM

## 2024-04-14 DIAGNOSIS — Z6830 Body mass index (BMI) 30.0-30.9, adult: Secondary | ICD-10-CM

## 2024-04-14 DIAGNOSIS — E669 Obesity, unspecified: Secondary | ICD-10-CM | POA: Diagnosis present

## 2024-04-14 DIAGNOSIS — R32 Unspecified urinary incontinence: Secondary | ICD-10-CM | POA: Diagnosis present

## 2024-04-14 DIAGNOSIS — Z83511 Family history of glaucoma: Secondary | ICD-10-CM

## 2024-04-14 DIAGNOSIS — F3342 Major depressive disorder, recurrent, in full remission: Secondary | ICD-10-CM | POA: Diagnosis present

## 2024-04-14 DIAGNOSIS — H269 Unspecified cataract: Secondary | ICD-10-CM | POA: Diagnosis present

## 2024-04-14 DIAGNOSIS — E66811 Obesity, class 1: Secondary | ICD-10-CM | POA: Diagnosis not present

## 2024-04-14 DIAGNOSIS — Z556 Problems related to health literacy: Secondary | ICD-10-CM

## 2024-04-14 DIAGNOSIS — E088 Diabetes mellitus due to underlying condition with unspecified complications: Secondary | ICD-10-CM | POA: Diagnosis present

## 2024-04-14 DIAGNOSIS — E782 Mixed hyperlipidemia: Secondary | ICD-10-CM | POA: Diagnosis not present

## 2024-04-14 DIAGNOSIS — Z888 Allergy status to other drugs, medicaments and biological substances status: Secondary | ICD-10-CM

## 2024-04-14 DIAGNOSIS — R933 Abnormal findings on diagnostic imaging of other parts of digestive tract: Secondary | ICD-10-CM

## 2024-04-14 DIAGNOSIS — B9620 Unspecified Escherichia coli [E. coli] as the cause of diseases classified elsewhere: Secondary | ICD-10-CM | POA: Diagnosis present

## 2024-04-14 DIAGNOSIS — Z7982 Long term (current) use of aspirin: Secondary | ICD-10-CM

## 2024-04-14 DIAGNOSIS — Z833 Family history of diabetes mellitus: Secondary | ICD-10-CM

## 2024-04-14 DIAGNOSIS — A09 Infectious gastroenteritis and colitis, unspecified: Secondary | ICD-10-CM

## 2024-04-14 DIAGNOSIS — K2100 Gastro-esophageal reflux disease with esophagitis, without bleeding: Secondary | ICD-10-CM | POA: Diagnosis present

## 2024-04-14 DIAGNOSIS — Z801 Family history of malignant neoplasm of trachea, bronchus and lung: Secondary | ICD-10-CM

## 2024-04-14 DIAGNOSIS — Z96652 Presence of left artificial knee joint: Secondary | ICD-10-CM | POA: Diagnosis present

## 2024-04-14 DIAGNOSIS — N12 Tubulo-interstitial nephritis, not specified as acute or chronic: Secondary | ICD-10-CM | POA: Diagnosis present

## 2024-04-14 DIAGNOSIS — F32A Depression, unspecified: Secondary | ICD-10-CM | POA: Diagnosis present

## 2024-04-14 DIAGNOSIS — K4090 Unilateral inguinal hernia, without obstruction or gangrene, not specified as recurrent: Secondary | ICD-10-CM | POA: Diagnosis present

## 2024-04-14 DIAGNOSIS — Z79899 Other long term (current) drug therapy: Secondary | ICD-10-CM

## 2024-04-14 DIAGNOSIS — K449 Diaphragmatic hernia without obstruction or gangrene: Secondary | ICD-10-CM | POA: Diagnosis present

## 2024-04-14 DIAGNOSIS — K551 Chronic vascular disorders of intestine: Principal | ICD-10-CM | POA: Diagnosis present

## 2024-04-14 DIAGNOSIS — Z8269 Family history of other diseases of the musculoskeletal system and connective tissue: Secondary | ICD-10-CM

## 2024-04-14 DIAGNOSIS — Z800 Family history of malignant neoplasm of digestive organs: Secondary | ICD-10-CM

## 2024-04-14 DIAGNOSIS — I10 Essential (primary) hypertension: Secondary | ICD-10-CM | POA: Diagnosis not present

## 2024-04-14 DIAGNOSIS — K3189 Other diseases of stomach and duodenum: Secondary | ICD-10-CM | POA: Diagnosis present

## 2024-04-14 DIAGNOSIS — Z7902 Long term (current) use of antithrombotics/antiplatelets: Secondary | ICD-10-CM

## 2024-04-14 DIAGNOSIS — Z955 Presence of coronary angioplasty implant and graft: Secondary | ICD-10-CM

## 2024-04-14 DIAGNOSIS — I252 Old myocardial infarction: Secondary | ICD-10-CM

## 2024-04-14 DIAGNOSIS — M797 Fibromyalgia: Secondary | ICD-10-CM | POA: Diagnosis present

## 2024-04-14 DIAGNOSIS — A0472 Enterocolitis due to Clostridium difficile, not specified as recurrent: Secondary | ICD-10-CM | POA: Diagnosis present

## 2024-04-14 LAB — CBC WITH DIFFERENTIAL/PLATELET
Abs Immature Granulocytes: 0.04 K/uL (ref 0.00–0.07)
Basophils Absolute: 0 K/uL (ref 0.0–0.1)
Basophils Relative: 0 %
Eosinophils Absolute: 0.2 K/uL (ref 0.0–0.5)
Eosinophils Relative: 2 %
HCT: 42.2 % (ref 36.0–46.0)
Hemoglobin: 14.2 g/dL (ref 12.0–15.0)
Immature Granulocytes: 0 %
Lymphocytes Relative: 14 %
Lymphs Abs: 1.4 K/uL (ref 0.7–4.0)
MCH: 35.9 pg — ABNORMAL HIGH (ref 26.0–34.0)
MCHC: 33.6 g/dL (ref 30.0–36.0)
MCV: 106.8 fL — ABNORMAL HIGH (ref 80.0–100.0)
Monocytes Absolute: 0.9 K/uL (ref 0.1–1.0)
Monocytes Relative: 9 %
Neutro Abs: 7.4 K/uL (ref 1.7–7.7)
Neutrophils Relative %: 75 %
Platelets: 205 K/uL (ref 150–400)
RBC: 3.95 MIL/uL (ref 3.87–5.11)
RDW: 13.9 % (ref 11.5–15.5)
WBC: 9.9 K/uL (ref 4.0–10.5)
nRBC: 0 % (ref 0.0–0.2)

## 2024-04-14 LAB — COMPREHENSIVE METABOLIC PANEL WITH GFR
ALT: 18 U/L (ref 0–44)
AST: 21 U/L (ref 15–41)
Albumin: 3.8 g/dL (ref 3.5–5.0)
Alkaline Phosphatase: 93 U/L (ref 38–126)
Anion gap: 13 (ref 5–15)
BUN: 27 mg/dL — ABNORMAL HIGH (ref 8–23)
CO2: 27 mmol/L (ref 22–32)
Calcium: 9.7 mg/dL (ref 8.9–10.3)
Chloride: 98 mmol/L (ref 98–111)
Creatinine, Ser: 1.09 mg/dL — ABNORMAL HIGH (ref 0.44–1.00)
GFR, Estimated: 50 mL/min — ABNORMAL LOW
Glucose, Bld: 150 mg/dL — ABNORMAL HIGH (ref 70–99)
Potassium: 4.1 mmol/L (ref 3.5–5.1)
Sodium: 138 mmol/L (ref 135–145)
Total Bilirubin: 0.4 mg/dL (ref 0.0–1.2)
Total Protein: 7.6 g/dL (ref 6.5–8.1)

## 2024-04-14 LAB — RESP PANEL BY RT-PCR (RSV, FLU A&B, COVID)  RVPGX2
Influenza A by PCR: NEGATIVE
Influenza B by PCR: NEGATIVE
Resp Syncytial Virus by PCR: NEGATIVE
SARS Coronavirus 2 by RT PCR: NEGATIVE

## 2024-04-14 LAB — URINALYSIS, W/ REFLEX TO CULTURE (INFECTION SUSPECTED)
Bilirubin Urine: NEGATIVE
Glucose, UA: NEGATIVE mg/dL
Hgb urine dipstick: NEGATIVE
Ketones, ur: NEGATIVE mg/dL
Nitrite: NEGATIVE
Protein, ur: NEGATIVE mg/dL
Specific Gravity, Urine: 1.011 (ref 1.005–1.030)
pH: 7 (ref 5.0–8.0)

## 2024-04-14 LAB — I-STAT CG4 LACTIC ACID, ED: Lactic Acid, Venous: 1.9 mmol/L (ref 0.5–1.9)

## 2024-04-14 LAB — LIPASE, BLOOD: Lipase: 32 U/L (ref 11–51)

## 2024-04-14 MED ORDER — DICLOFENAC EPOLAMINE 1.3 % EX PTCH
1.0000 | MEDICATED_PATCH | Freq: Two times a day (BID) | CUTANEOUS | Status: DC
  Administered 2024-04-14 – 2024-04-18 (×7): 1 via TRANSDERMAL
  Filled 2024-04-14 (×11): qty 1

## 2024-04-14 MED ORDER — SODIUM CHLORIDE 0.9 % IV BOLUS
500.0000 mL | Freq: Once | INTRAVENOUS | Status: AC
  Administered 2024-04-14: 500 mL via INTRAVENOUS

## 2024-04-14 MED ORDER — SODIUM CHLORIDE 0.9% FLUSH
3.0000 mL | Freq: Two times a day (BID) | INTRAVENOUS | Status: DC
  Administered 2024-04-15 – 2024-04-18 (×8): 3 mL via INTRAVENOUS

## 2024-04-14 MED ORDER — ENOXAPARIN SODIUM 40 MG/0.4ML IJ SOSY
40.0000 mg | PREFILLED_SYRINGE | INTRAMUSCULAR | Status: DC
  Administered 2024-04-14 – 2024-04-16 (×3): 40 mg via SUBCUTANEOUS
  Filled 2024-04-14 (×3): qty 0.4

## 2024-04-14 MED ORDER — ATORVASTATIN CALCIUM 80 MG PO TABS
80.0000 mg | ORAL_TABLET | Freq: Every day | ORAL | Status: DC
  Administered 2024-04-15 – 2024-04-18 (×4): 80 mg via ORAL
  Filled 2024-04-14 (×4): qty 1

## 2024-04-14 MED ORDER — RISAQUAD PO CAPS
1.0000 | ORAL_CAPSULE | Freq: Every day | ORAL | Status: DC
  Administered 2024-04-15 – 2024-04-18 (×4): 1 via ORAL
  Filled 2024-04-14 (×4): qty 1

## 2024-04-14 MED ORDER — ONDANSETRON HCL 4 MG/2ML IJ SOLN: 4.0000 mg | Freq: Once | INTRAMUSCULAR | Status: DC

## 2024-04-14 MED ORDER — POLYETHYLENE GLYCOL 3350 17 G PO PACK: 17.0000 g | PACK | Freq: Every day | ORAL | Status: DC | PRN

## 2024-04-14 MED ORDER — VENLAFAXINE HCL ER 150 MG PO CP24
150.0000 mg | ORAL_CAPSULE | Freq: Every day | ORAL | Status: DC
  Administered 2024-04-15 – 2024-04-18 (×4): 150 mg via ORAL
  Filled 2024-04-14: qty 2
  Filled 2024-04-14: qty 1
  Filled 2024-04-14 (×2): qty 2

## 2024-04-14 MED ORDER — FAMOTIDINE 20 MG PO TABS
40.0000 mg | ORAL_TABLET | Freq: Every day | ORAL | Status: DC
  Administered 2024-04-15 – 2024-04-18 (×4): 40 mg via ORAL
  Filled 2024-04-14 (×4): qty 2

## 2024-04-14 MED ORDER — METOCLOPRAMIDE HCL 5 MG/ML IJ SOLN
5.0000 mg | Freq: Once | INTRAMUSCULAR | Status: AC
  Administered 2024-04-14: 5 mg via INTRAVENOUS
  Filled 2024-04-14: qty 2

## 2024-04-14 MED ORDER — ALUM & MAG HYDROXIDE-SIMETH 200-200-20 MG/5ML PO SUSP
30.0000 mL | Freq: Four times a day (QID) | ORAL | Status: DC | PRN
  Administered 2024-04-14 – 2024-04-15 (×2): 30 mL via ORAL
  Filled 2024-04-14 (×2): qty 30

## 2024-04-14 MED ORDER — ONDANSETRON HCL 4 MG/2ML IJ SOLN
4.0000 mg | Freq: Once | INTRAMUSCULAR | Status: DC
  Filled 2024-04-14: qty 2

## 2024-04-14 MED ORDER — IOHEXOL 350 MG/ML SOLN
75.0000 mL | Freq: Once | INTRAVENOUS | Status: AC | PRN
  Administered 2024-04-14: 75 mL via INTRAVENOUS

## 2024-04-14 MED ORDER — ASPIRIN 81 MG PO TBEC
81.0000 mg | DELAYED_RELEASE_TABLET | Freq: Every day | ORAL | Status: DC
  Administered 2024-04-14 – 2024-04-17 (×4): 81 mg via ORAL
  Filled 2024-04-14 (×4): qty 1

## 2024-04-14 MED ORDER — ACETAMINOPHEN 650 MG RE SUPP: 650.0000 mg | Freq: Four times a day (QID) | RECTAL | Status: DC | PRN

## 2024-04-14 MED ORDER — DORZOLAMIDE HCL-TIMOLOL MAL 2-0.5 % OP SOLN
1.0000 [drp] | Freq: Two times a day (BID) | OPHTHALMIC | Status: DC
  Administered 2024-04-14 – 2024-04-18 (×8): 1 [drp] via OPHTHALMIC
  Filled 2024-04-14: qty 10

## 2024-04-14 MED ORDER — CLOPIDOGREL BISULFATE 75 MG PO TABS
75.0000 mg | ORAL_TABLET | Freq: Every day | ORAL | Status: DC
  Administered 2024-04-15 – 2024-04-18 (×4): 75 mg via ORAL
  Filled 2024-04-14 (×4): qty 1

## 2024-04-14 MED ORDER — METOPROLOL TARTRATE 50 MG PO TABS
50.0000 mg | ORAL_TABLET | Freq: Two times a day (BID) | ORAL | Status: DC
  Administered 2024-04-14 – 2024-04-18 (×8): 50 mg via ORAL
  Filled 2024-04-14 (×8): qty 1

## 2024-04-14 MED ORDER — DICLOFENAC SODIUM 1 % EX GEL
2.0000 g | Freq: Four times a day (QID) | CUTANEOUS | Status: DC
  Administered 2024-04-14 – 2024-04-18 (×10): 2 g via TOPICAL
  Filled 2024-04-14 (×3): qty 100

## 2024-04-14 MED ORDER — ACETAMINOPHEN 325 MG PO TABS
650.0000 mg | ORAL_TABLET | Freq: Four times a day (QID) | ORAL | Status: DC | PRN
  Administered 2024-04-14: 650 mg via ORAL
  Filled 2024-04-14: qty 2

## 2024-04-14 MED ORDER — AMLODIPINE BESYLATE 10 MG PO TABS
10.0000 mg | ORAL_TABLET | Freq: Every day | ORAL | Status: DC
  Administered 2024-04-15 – 2024-04-18 (×4): 10 mg via ORAL
  Filled 2024-04-14 (×4): qty 1

## 2024-04-14 MED ORDER — NETARSUDIL-LATANOPROST 0.02-0.005 % OP SOLN: 1.0000 [drp] | Freq: Every day | OPHTHALMIC | Status: DC

## 2024-04-14 MED ORDER — IRBESARTAN 150 MG PO TABS
150.0000 mg | ORAL_TABLET | Freq: Every day | ORAL | Status: DC
  Administered 2024-04-14 – 2024-04-18 (×5): 150 mg via ORAL
  Filled 2024-04-14: qty 2
  Filled 2024-04-14: qty 1
  Filled 2024-04-14 (×3): qty 2

## 2024-04-14 MED ORDER — FIDAXOMICIN 200 MG PO TABS
200.0000 mg | ORAL_TABLET | Freq: Two times a day (BID) | ORAL | Status: DC
  Administered 2024-04-14 – 2024-04-18 (×8): 200 mg via ORAL
  Filled 2024-04-14 (×12): qty 1

## 2024-04-14 NOTE — H&P (Signed)
 " History and Physical   Amita Atayde FMW:[id number] DOB: [birth year] DOA: 04/14/2024  PCP: Erick Greig LABOR, NP   Patient coming from: Home  Chief Complaint: Nausea, vomiting  HPI: Cassandra Brooks is a 84 y.o. female with medical history significant of hypertension, hyperlipidemia, CVA, diabetes, GERD, CAD, depression, glaucoma, cataracts, obesity, AAA, spinal stenosis, fibromyalgia presenting with nausea and vomiting.  Patient with multiple recent admissions.  Was admitted 12/11-12/16 with nausea vomiting and was noted to have right-sided pyelonephritis.  Was treated with antibiotics and developed complication of diarrhea.  Returned 12/26 and stayed until 1/1 with recurrent diarrhea found to be E. coli and C. difficile positive.  Started on fidaxomicin  and discharged on this.  At this point urine is thought to be colonized.  At home patient initially doing okay but for the past 2 days she has been unable to tolerate p.o. including her medications (fidaxomicin ).  Present to the ED for further evaluation of this intractable nausea and vomiting.  Denies fevers, chills, chest pain, shortness of breath, constipation, diarrhea.  ED Course: Vital signs in the ED notable for blood pressure in the 160s-190s systolic.  Lab workup included CMP with BUN 27, creatinine near baseline at 1.09, glucose 150.  CBC within normal limits.  Lactic acid normal.  Lipase normal.  Respiratory panel for flu COVID and RSV pending.  Urinalysis with leukocytes and rare bacteria only.  Urine culture pending.  CT on pelvis showed persistent right kidney changes favoring sequela of prior pyelonephritis.  Also noted was a small inguinal hernia with distal esophageal wall thickening possibly representing esophagitis.  Severe stenosis of the yesterday was noted**.  Changes consistent with edema or ILD of the lungs noted.  Patient received Reglan , Zofran , 1 L IV fluids in the ED.  Persistent nausea vomiting despite  intervention.  Review of Systems: As per HPI otherwise all other systems reviewed and are negative.  Past Medical History:  Diagnosis Date   AAA (abdominal aortic aneurysm)    ACS (acute coronary syndrome) (HCC) 08/01/2022   Arthritis    knees (03/21/2017)   Borderline diabetes    Bulging lumbar disc    CAD (coronary artery disease) 08/17/2022   Depression    Diabetes mellitus due to underlying condition with unspecified complications (HCC) 12/19/2019   Disequilibrium    Dizziness    Essential hypertension 12/19/2019   Fall    Fibromyalgia    GERD (gastroesophageal reflux disease)    Glaucoma, both eyes    Hemoptysis 03/18/2015   Hyperlipidemia    don't have it but I take RX (03/21/2017)   Hypertension    Mixed dyslipidemia 12/19/2019   Mixed hyperlipidemia 04/18/2017   Nuclear cataract of both eyes 01/01/2014   Obesity (BMI 30.0-34.9) 09/29/2022   Plantar fasciitis 10/21/2020   Posterior tibial tendon dysfunction (PTTD) of right lower extremity 11/16/2021   Pre-diabetes    pt states she is not diabetic. Last A1C was normal. Pt is on no medications for DM   Preoperative cardiovascular examination 12/19/2019   Primary osteoarthritis of left foot 10/21/2020   Primary osteoarthritis of left knee 07/18/2019   Solitary pulmonary nodule 03/18/2015   03/2015 6mm RLL nodule    Spinal stenosis of lumbar region with neurogenic claudication 04/26/2021   Spinal stenosis, lumbar region with neurogenic claudication 04/26/2021   Central stenosis, L2-3 and L3-4 with moderate foramenal narrowing L4-5   Status post AAA (abdominal aortic aneurysm) repair 12/19/2019   Status post total left knee replacement 03/27/2020  STEMI (ST elevation myocardial infarction) (HCC) 08/01/2022   STEMI involving right coronary artery (HCC) 07/29/2022   Syncope 03/21/2017   Syncope and collapse    TIA (transient ischemic attack) 08/03/2023    Past Surgical History:  Procedure Laterality Date    ABDOMINAL AORTIC ANEURYSM REPAIR  1966   APPENDECTOMY  1966   CARDIAC CATHETERIZATION     20+ years ago, no abnormalities found   COLONOSCOPY  11/02/2016   Dr Towana. A few diverticuli were seen in the sigmoid colon. Otherwise normal colonoscopy to cecum   CORONARY/GRAFT ACUTE MI REVASCULARIZATION N/A 07/29/2022   Procedure: Coronary/Graft Acute MI Revascularization;  Surgeon: Burnard Debby LABOR, MD;  Location: [facility] INVASIVE CV LAB;  Service: Cardiovascular;  Laterality: N/A;   LEFT HEART CATH AND CORONARY ANGIOGRAPHY N/A 07/29/2022   Procedure: LEFT HEART CATH AND CORONARY ANGIOGRAPHY;  Surgeon: Burnard Debby LABOR, MD;  Location: [facility] INVASIVE CV LAB;  Service: Cardiovascular;  Laterality: N/A;   LUMBAR LAMINECTOMY/DECOMPRESSION MICRODISCECTOMY N/A 04/26/2021   Procedure: CENTRAL LAMINECTOMIES LUMBAR TWO-THREE,  LUMBAR THREE-FOUR WITH BILATERAL FORAMINOTOMIES;  Surgeon: Lucilla Lynwood BRAVO, MD;  Location: [facility] OR;  Service: Orthopedics;  Laterality: N/A;   TOTAL KNEE ARTHROPLASTY Left 03/27/2020   Procedure: LEFT TOTAL KNEE ARTHROPLASTY;  Surgeon: Jerri Kay HERO, MD;  Location: [facility] OR;  Service: Orthopedics;  Laterality: Left;   VAGINAL HYSTERECTOMY     partial   VIDEO BRONCHOSCOPY Bilateral 03/19/2015   Procedure: VIDEO BRONCHOSCOPY WITHOUT FLUORO;  Surgeon: Harden Jude GAILS, MD;  Location: [facility] ENDOSCOPY;  Service: Cardiopulmonary;  Laterality: Bilateral;    Social History  reports that she has never smoked. She has never used smokeless tobacco. She reports that she does not drink alcohol and does not use drugs.  Allergies[1]  Family History  Problem Relation Age of Onset   Hypertension Father    Stomach cancer Father    Glaucoma Mother    Diabetes Mother    Diabetes Sister    Diabetes Brother    Lupus Child    Diabetes Brother    Lung cancer Brother    Colon cancer Brother   Reviewed on admission  Prior to Admission medications  Medication Sig Start Date End Date Taking? Authorizing Provider   acetaminophen  (TYLENOL ) 650 MG CR tablet Take 650 mg by mouth 2 (two) times daily.    [provider]  amLODipine  (NORVASC ) 10 MG tablet Take 10 mg by mouth daily. 02/20/24   [provider]  aspirin  EC 81 MG tablet Take 1 tablet (81 mg total) by mouth at bedtime. 08/05/23   Dennise Lavada POUR, MD  atorvastatin  (LIPITOR ) 80 MG tablet Take 1 tablet (80 mg total) by mouth daily. 08/02/22   Henry Manuelita NOVAK, NP  azelastine (ASTELIN) 0.1 % nasal spray Place 1-2 sprays into both nostrils 2 (two) times daily as needed for rhinitis or allergies.    [provider]  baclofen  (LIORESAL ) 10 MG tablet Take 1 tablet (10 mg total) by mouth at bedtime as needed for muscle spasms. Patient not taking: Reported on 04/10/2024 04/01/24   Williams, Megan E, NP  clopidogrel  (PLAVIX ) 75 MG tablet Take 1 tablet (75 mg total) by mouth daily. 08/05/23   Dennise Lavada POUR, MD  diclofenac  (FLECTOR ) 1.3 % PTCH Place 1 patch onto the skin 2 (two) times daily. 04/11/24   Samtani, Jai-Gurmukh, MD  diclofenac  Sodium (VOLTAREN ) 1 % GEL Apply 2 g topically 4 (four) times daily. 04/11/24   Samtani, Jai-Gurmukh, MD  dorzolamide -timolol  (COSOPT ) 22.3-6.8  MG/ML ophthalmic solution Place 1 drop into both eyes 2 (two) times daily.    [provider]  famotidine  (PEPCID ) 40 MG tablet Take 1 tablet (40 mg total) by mouth daily. 04/12/24   Samtani, Jai-Gurmukh, MD  fidaxomicin  (DIFICID ) 200 MG TABS tablet Take 1 tablet (200 mg total) by mouth 2 (two) times daily for 5 days. 04/11/24 04/16/24  Samtani, Jai-Gurmukh, MD  fluticasone  (FLONASE ) 50 MCG/ACT nasal spray Place 2 sprays into both nostrils daily. 11/08/19   [provider]  metoprolol  tartrate (LOPRESSOR ) 50 MG tablet Take 1 tablet (50 mg total) by mouth 2 (two) times daily. 04/11/24   Samtani, Jai-Gurmukh, MD  Multiple Vitamins-Minerals (MULTIVITAMIN WOMEN 50+) TABS Take 1 tablet by mouth daily.    [provider]  nabumetone  (RELAFEN ) 750 MG  tablet Take 750 mg by mouth 2 (two) times daily. 02/21/17   [provider]  nitroGLYCERIN  (NITROSTAT ) 0.4 MG SL tablet Place 0.4 mg under the tongue every 5 (five) minutes as needed for chest pain.    [provider]  ondansetron  (ZOFRAN -ODT) 4 MG disintegrating tablet Take 1 tablet (4 mg total) by mouth every 4 (four) hours as needed. 03/26/24   Fausto Burnard LABOR, DO  Probiotic Product (PROBIOTIC PO) Take 1 capsule by mouth daily.    [provider]  ROCKLATAN  0.02-0.005 % SOLN Apply 1 drop to eye at bedtime. 02/09/24   [provider]  valsartan  (DIOVAN ) 160 MG tablet Take 1 tablet (160 mg total) by mouth at bedtime. 08/07/23   Dennise Lavada POUR, MD  venlafaxine  XR (EFFEXOR -XR) 150 MG 24 hr capsule Take 1 capsule (150 mg total) by mouth daily with breakfast. 03/28/17   Erle Hails, DO    Physical Exam: Vitals:   04/14/24 0934 04/14/24 1420  BP: (!) 162/74 (!) 198/97  Pulse: 70 70  Resp: 14 16  Temp: 98.7 F (37.1 C) 98.6 F (37 C)  TempSrc:  Oral  SpO2: 96% 100%   Physical Exam Constitutional:      General: She is not in acute distress.    Appearance: Normal appearance.  HENT:     Head: Normocephalic and atraumatic.     Mouth/Throat:     Mouth: Mucous membranes are moist.     Pharynx: Oropharynx is clear.  Eyes:     Extraocular Movements: Extraocular movements intact.     Pupils: Pupils are equal, round, and reactive to light.  Cardiovascular:     Rate and Rhythm: Normal rate and regular rhythm.     Pulses: Normal pulses.     Heart sounds: Normal heart sounds.  Pulmonary:     Effort: Pulmonary effort is normal. No respiratory distress.     Breath sounds: Normal breath sounds.  Abdominal:     General: Bowel sounds are normal. There is no distension.     Palpations: Abdomen is soft.     Tenderness: There is no abdominal tenderness.  Musculoskeletal:        General: No swelling or deformity.  Skin:    General: Skin is warm and dry.   Neurological:     General: No focal deficit present.     Mental Status: Mental status is at baseline.     Comments: Appear to have baseline mild confusion after stroke    Labs on Admission: I have personally reviewed following labs and imaging studies  CBC: Recent Labs  Lab 04/08/24 0350 04/09/24 0053 04/10/24 1401 04/14/24 0951  WBC 7.9 8.1 6.6 9.9  NEUTROABS  --   --   --  7.4  HGB 9.6* 10.8* 11.8* 14.2  HCT 29.7* 32.9* 35.2* 42.2  MCV 108.8* 107.9* 104.8* 106.8*  PLT 173 192 193 205    Basic Metabolic Panel: Recent Labs  Lab 04/08/24 0350 04/09/24 0053 04/10/24 1401 04/14/24 0951  NA 142 139 137 138  K 3.5 4.8 4.2 4.1  CL 109 107 103 98  CO2 23 25 24 27   GLUCOSE 107* 146* 95 150*  BUN 26* 25* 20 27*  CREATININE 0.94 0.95 0.86 1.09*  CALCIUM  8.0* 8.1* 8.8* 9.7  MG 1.5*  --  1.9  --     GFR: Estimated Creatinine Clearance: 37.5 mL/min (A) (by C-G formula based on SCr of 1.09 mg/dL (H)).  Liver Function Tests: Recent Labs  Lab 04/08/24 0350 04/09/24 0053 04/10/24 1401 04/14/24 0951  AST 27 19 23 21   ALT 26 24 19 18   ALKPHOS 62 73 78 93  BILITOT 0.2 0.3 0.4 0.4  PROT 5.3* 5.7* 6.1* 7.6  ALBUMIN  3.0* 3.2* 3.3* 3.8    Urine analysis:    Component Value Date/Time   COLORURINE YELLOW 04/14/2024 1309   APPEARANCEUR CLOUDY (A) 04/14/2024 1309   LABSPEC 1.011 04/14/2024 1309   PHURINE 7.0 04/14/2024 1309   GLUCOSEU NEGATIVE 04/14/2024 1309   HGBUR NEGATIVE 04/14/2024 1309   BILIRUBINUR NEGATIVE 04/14/2024 1309   KETONESUR NEGATIVE 04/14/2024 1309   PROTEINUR NEGATIVE 04/14/2024 1309   NITRITE NEGATIVE 04/14/2024 1309   LEUKOCYTESUR LARGE (A) 04/14/2024 1309    Radiological Exams on Admission: CT ABDOMEN PELVIS W CONTRAST Result Date: 04/14/2024 CLINICAL DATA:  Abdominal pain, acute, nonlocalized UTI, recurrent/complicated (Female) EXAM: CT ABDOMEN AND PELVIS WITH CONTRAST TECHNIQUE: Multidetector CT imaging of the abdomen and pelvis was performed  using the standard protocol following bolus administration of intravenous contrast. RADIATION DOSE REDUCTION: This exam was performed according to the departmental dose-optimization program which includes automated exposure control, adjustment of the mA and/or kV according to patient size and/or use of iterative reconstruction technique. CONTRAST:  75mL OMNIPAQUE  IOHEXOL  350 MG/ML SOLN COMPARISON:  April 05, 2024 FINDINGS: Lower chest: Mild subpleural reticulation with some possible interlobular septal thickening. Hepatobiliary: No focal liver abnormality is seen. No gallstones, gallbladder wall thickening, or biliary dilatation. Pancreas: Unremarkable. No pancreatic ductal dilatation or surrounding inflammatory changes. Spleen: Normal in size without focal abnormality. Adrenals/Urinary Tract: Adrenal glands are unremarkable. There is persistent hypoenhancement of the superomedial pole of the RIGHT kidney. No hydronephrosis. 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 Scarring of the LEFT kidney. Excreted contrast within the renal collecting system obscures evaluation for nephrolithiasis. Recommend correlation with history of contrast administration timing. Bladder is unremarkable. Stomach/Bowel: No evidence of bowel obstruction. Moderate colonic stool burden. Status post appendectomy. Small hiatal hernia with circumferential wall thickening of the distal esophagus. Vascular/Lymphatic: Atherosclerotic calcifications of the nonaneurysmal abdominal aorta. Severe atherosclerotic calcifications at the origin of the SMA with a likely small area of poststenotic dilation. This results in likely severe stenosis. Flow is seen distally. No suspicious lymphadenopathy. Reproductive: Status post hysterectomy. No adnexal masses. Other: Postsurgical changes along the midline abdomen. No free air or free fluid. Musculoskeletal: Degenerative changes of the lumbar spine with retrolisthesis of L2-3. IMPRESSION: 1. There  is persistent hypoenhancement of the superomedial pole of the RIGHT kidney. This is favored to reflect sequela of pyelonephritis. Recommend correlation with urinalysis. 2. Small hiatal hernia with circumferential wall thickening of the distal esophagus. This could reflect esophagitis. 3. Severe atherosclerotic  calcifications at the origin of the SMA with a small area of poststenotic dilation. 4. Mild subpleural reticulation with some possible interlobular septal thickening. This could reflect mild pulmonary edema superimposed on a background of mild interstitial lung disease. Aortic Atherosclerosis (ICD10-I70.0). Electronically Signed   By: Corean Salter M.D.   On: 04/14/2024 13:45   EKG: Independently reviewed.  Sinus rhythm at 70 beats rhythm.  Nonspecific T wave changes.  Assessment/Plan Principal Problem:   Intractable nausea and vomiting Active Problems:   GERD (gastroesophageal reflux disease)   AAA (abdominal aortic aneurysm)   Essential hypertension   Glaucoma, both eyes   Depression, major, recurrent, in complete remission   Diabetes mellitus due to underlying condition with unspecified complications (HCC)   Spinal stenosis of lumbar region with neurogenic claudication   Mixed hyperlipidemia   CAD (coronary artery disease)   Obesity (BMI 30.0-34.9)   History of CVA (cerebrovascular accident)   Fibromyalgia   Cataract   C. difficile colitis   Intractable nausea vomiting Weakness > Unclear etiology. CTA only suspicious for possible esophagitis. > Not able to tolerate p.o. including her for fidaxomicin  in the last couple days. > Still not tolerating p.o. despite antiemetics in the ED. - Monitor on telemetry overnight - Continue antiemetics - PT/OT eval and treat - Supportive care  C. difficile colitis > Partially/incompletely treated.  May need to reset the start date of her treatment - Restart fidaxomicin   - Enteric precautions  Abnormal CT > Severe SMA stenosis.   Lactic acid normal. > Also appears to have had CTA PE study that was not ordered (and subsequently not interpreted), if any respiratory concerns arise, have radiology review this.  There was some mention in the read of possible edema versus ILD. - Will benefit from outpatient vascular surgery follow-up   Hypertension - Continue amlodipine , metoprolol  - Replace home valsartan  with formulary irbesartan   History of CVA - Continue home ASA, Plavix   CAD - Continue home ASA, Plavix  - Continue home metoprolol , ARB  Depression - Continue home venlafaxine   Glaucoma Cataract - Continue home eyedrops  Obesity - Noted  History of AAA - Noted  DVT prophylaxis: Lovenox  Code Status:   Full Family Communication:  Updated at bedside  Disposition Plan:   Patient is from:  Home  Anticipated DC to:  Pending clinical course  Anticipated DC date:  1 to 3 days  Anticipated DC barriers: None  Consults called:  None Admission status:  Observation, telemetry  Severity of Illness: The appropriate patient status for this patient is OBSERVATION. Observation status is judged to be reasonable and necessary in order to provide the required intensity of service to ensure the patient's safety. The patient's presenting symptoms, physical exam findings, and initial radiographic and laboratory data in the context of their medical condition is felt to place them at decreased risk for further clinical deterioration. Furthermore, it is anticipated that the patient will be medically stable for discharge from the hospital within 2 midnights of admission.    Marsa KATHEE Scurry MD [facility]  How to contact the [facility] Attending or Consulting provider 7A - 7P or covering provider during after hours 7P -7A, for this patient?   Check the care team in [facility] and look for a) attending/consulting [facility] provider listed and b) the [facility] team listed Log into [email or web address] and use [facility]'s universal password to  access. If you do not have the password, please contact the hospital operator. Locate the [facility] provider you are looking for  under [facility] and page to a number that you can be directly reached. If you still have difficulty reaching the provider, please page the [facility] (Director on Call) for the Hospitalists listed on amion for assistance.  04/14/2024, 3:51 PM       [1]  Allergies Allergen Reactions   Zestril  [Lisinopril ] Cough   "

## 2024-04-14 NOTE — ED Notes (Signed)
 Pt attempted to provide urine sample in triage but was unable to

## 2024-04-14 NOTE — ED Notes (Signed)
 Patient transported to CT

## 2024-04-14 NOTE — ED Notes (Signed)
 Pt given sandwich and ginger ale.

## 2024-04-14 NOTE — Plan of Care (Signed)

## 2024-04-14 NOTE — ED Triage Notes (Signed)
 Pt recently discharged from hospital admission 3 days ago. Pt had pyelonephritis. Pt returns due to continued vomiting, unable to keep anything down. Pt had c diff infection but states her diarrhea has improved. Pt taking zofran  and phenergan  without relief.

## 2024-04-14 NOTE — ED Provider Notes (Signed)
 "  EMERGENCY DEPARTMENT AT [facility] Provider Note   CSN: [id number] Arrival date & time: 04/14/24  0930     Patient presents with: Emesis   Cassandra Brooks is a 84 y.o. female.   84 year old female presenting emergency department with nausea vomiting some left-sided abdominal pain.  Recent hospitalization for pyelonephritis, [name].  Had some nausea and vomiting on admission at that time as well.  Reports doing okay on Friday, but started having nausea vomiting yesterday and today.  No blood in the vomit.  Still having bowel movements last 1 this morning.  Reports her C. difficile symptoms are improving.  But does have some generalized malaise.  Tried taking home Zofran  and Phenergan , but continued to vomit.  Unable to take her C. difficile medications.   Emesis      Prior to Admission medications  Medication Sig Start Date End Date Taking? Authorizing Provider  acetaminophen  (TYLENOL ) 650 MG CR tablet Take 650 mg by mouth 2 (two) times daily.    [provider]  amLODipine  (NORVASC ) 10 MG tablet Take 10 mg by mouth daily. 02/20/24   [provider]  aspirin  EC 81 MG tablet Take 1 tablet (81 mg total) by mouth at bedtime. 08/05/23   Dennise Lavada POUR, MD  atorvastatin  (LIPITOR ) 80 MG tablet Take 1 tablet (80 mg total) by mouth daily. 08/02/22   Henry Manuelita NOVAK, NP  azelastine (ASTELIN) 0.1 % nasal spray Place 1-2 sprays into both nostrils 2 (two) times daily as needed for rhinitis or allergies.    [provider]  baclofen  (LIORESAL ) 10 MG tablet Take 1 tablet (10 mg total) by mouth at bedtime as needed for muscle spasms. Patient not taking: Reported on 04/10/2024 04/01/24   Williams, Megan E, NP  clopidogrel  (PLAVIX ) 75 MG tablet Take 1 tablet (75 mg total) by mouth daily. 08/05/23   Dennise Lavada POUR, MD  diclofenac  (FLECTOR ) 1.3 % PTCH Place 1 patch onto the skin 2 (two) times daily. 04/11/24   Samtani, Jai-Gurmukh, MD   diclofenac  Sodium (VOLTAREN ) 1 % GEL Apply 2 g topically 4 (four) times daily. 04/11/24   Samtani, Jai-Gurmukh, MD  dorzolamide -timolol  (COSOPT ) 22.3-6.8 MG/ML ophthalmic solution Place 1 drop into both eyes 2 (two) times daily.    [provider]  famotidine  (PEPCID ) 40 MG tablet Take 1 tablet (40 mg total) by mouth daily. 04/12/24   Samtani, Jai-Gurmukh, MD  fidaxomicin  (DIFICID ) 200 MG TABS tablet Take 1 tablet (200 mg total) by mouth 2 (two) times daily for 5 days. 04/11/24 04/16/24  Samtani, Jai-Gurmukh, MD  fluticasone  (FLONASE ) 50 MCG/ACT nasal spray Place 2 sprays into both nostrils daily. 11/08/19   [provider]  metoprolol  tartrate (LOPRESSOR ) 50 MG tablet Take 1 tablet (50 mg total) by mouth 2 (two) times daily. 04/11/24   Samtani, Jai-Gurmukh, MD  Multiple Vitamins-Minerals (MULTIVITAMIN WOMEN 50+) TABS Take 1 tablet by mouth daily.    [provider]  nabumetone  (RELAFEN ) 750 MG tablet Take 750 mg by mouth 2 (two) times daily. 02/21/17   [provider]  nitroGLYCERIN  (NITROSTAT ) 0.4 MG SL tablet Place 0.4 mg under the tongue every 5 (five) minutes as needed for chest pain.    [provider]  ondansetron  (ZOFRAN -ODT) 4 MG disintegrating tablet Take 1 tablet (4 mg total) by mouth every 4 (four) hours as needed. 03/26/24   Fausto Burnard LABOR, DO  Probiotic Product (PROBIOTIC PO) Take 1 capsule by mouth daily.  [provider]  ROCKLATAN  0.02-0.005 % SOLN Apply 1 drop to eye at bedtime. 02/09/24   [provider]  valsartan  (DIOVAN ) 160 MG tablet Take 1 tablet (160 mg total) by mouth at bedtime. 08/07/23   Dennise Lavada POUR, MD  venlafaxine  XR (EFFEXOR -XR) 150 MG 24 hr capsule Take 1 capsule (150 mg total) by mouth daily with breakfast. 03/28/17   Erle Hails, DO    Allergies: Zestril  [lisinopril ]    Review of Systems  Gastrointestinal:  Positive for vomiting.    Updated Vital Signs BP (!) 198/97 (BP Location: Right Arm)    Pulse 70   Temp 98.6 F (37 C) (Oral)   Resp 16   SpO2 100%   Physical Exam Vitals and nursing note reviewed.  Constitutional:      General: She is not in acute distress.    Appearance: She is not toxic-appearing.  HENT:     Nose: Nose normal.     Mouth/Throat:     Mouth: Mucous membranes are moist.  Eyes:     Conjunctiva/sclera: Conjunctivae normal.  Cardiovascular:     Rate and Rhythm: Normal rate and regular rhythm.  Pulmonary:     Effort: Pulmonary effort is normal.     Breath sounds: Normal breath sounds.  Abdominal:     General: Abdomen is flat. There is no distension.     Tenderness: There is abdominal tenderness. There is no guarding or rebound.  Musculoskeletal:        General: Normal range of motion.  Skin:    General: Skin is warm and dry.     Capillary Refill: Capillary refill takes less than 2 seconds.  Neurological:     Mental Status: She is alert and oriented to person, place, and time.  Psychiatric:        Mood and Affect: Mood normal.        Behavior: Behavior normal.     (all labs ordered are listed, but only abnormal results are displayed) Labs Reviewed  COMPREHENSIVE METABOLIC PANEL WITH GFR - Abnormal; Notable for the following components:      Result Value   Glucose, Bld 150 (*)    BUN 27 (*)    Creatinine, Ser 1.09 (*)    GFR, Estimated 50 (*)    All other components within normal limits  CBC WITH DIFFERENTIAL/PLATELET - Abnormal; Notable for the following components:   MCV 106.8 (*)    MCH 35.9 (*)    All other components within normal limits  URINALYSIS, W/ REFLEX TO CULTURE (INFECTION SUSPECTED) - Abnormal; Notable for the following components:   APPearance CLOUDY (*)    Leukocytes,Ua LARGE (*)    Bacteria, UA RARE (*)    All other components within normal limits  URINE CULTURE  RESP PANEL BY RT-PCR (RSV, FLU A&B, COVID)  RVPGX2  LIPASE, BLOOD  I-STAT CG4 LACTIC ACID, ED    EKG: EKG Interpretation Date/Time:  Sunday April 14 2024 09:53:28 EST Ventricular Rate:  70 PR Interval:  158 QRS Duration:  62 QT Interval:  428 QTC Calculation: 462 R Axis:   55  Text Interpretation: Sinus rhythm with Premature atrial complexes Nonspecific T wave abnormality Confirmed by Neysa Clap [phone]) on 04/14/2024 11:59:11 AM  Radiology: CT ABDOMEN PELVIS W CONTRAST Result Date: 04/14/2024 CLINICAL DATA:  Abdominal pain, acute, nonlocalized UTI, recurrent/complicated (Female) EXAM: CT ABDOMEN AND PELVIS WITH CONTRAST TECHNIQUE: Multidetector CT imaging of the abdomen and pelvis was performed using the standard protocol following  bolus administration of intravenous contrast. RADIATION DOSE REDUCTION: This exam was performed according to the departmental dose-optimization program which includes automated exposure control, adjustment of the mA and/or kV according to patient size and/or use of iterative reconstruction technique. CONTRAST:  75mL OMNIPAQUE  IOHEXOL  350 MG/ML SOLN COMPARISON:  April 05, 2024 FINDINGS: Lower chest: Mild subpleural reticulation with some possible interlobular septal thickening. Hepatobiliary: No focal liver abnormality is seen. No gallstones, gallbladder wall thickening, or biliary dilatation. Pancreas: Unremarkable. No pancreatic ductal dilatation or surrounding inflammatory changes. Spleen: Normal in size without focal abnormality. Adrenals/Urinary Tract: Adrenal glands are unremarkable. There is persistent hypoenhancement of the superomedial pole of the RIGHT kidney. No hydronephrosis. 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 Scarring of the LEFT kidney. Excreted contrast within the renal collecting system obscures evaluation for nephrolithiasis. Recommend correlation with history of contrast administration timing. Bladder is unremarkable. Stomach/Bowel: No evidence of bowel obstruction. Moderate colonic stool burden. Status post appendectomy. Small hiatal hernia with circumferential wall thickening  of the distal esophagus. Vascular/Lymphatic: Atherosclerotic calcifications of the nonaneurysmal abdominal aorta. Severe atherosclerotic calcifications at the origin of the SMA with a likely small area of poststenotic dilation. This results in likely severe stenosis. Flow is seen distally. No suspicious lymphadenopathy. Reproductive: Status post hysterectomy. No adnexal masses. Other: Postsurgical changes along the midline abdomen. No free air or free fluid. Musculoskeletal: Degenerative changes of the lumbar spine with retrolisthesis of L2-3. IMPRESSION: 1. There is persistent hypoenhancement of the superomedial pole of the RIGHT kidney. This is favored to reflect sequela of pyelonephritis. Recommend correlation with urinalysis. 2. Small hiatal hernia with circumferential wall thickening of the distal esophagus. This could reflect esophagitis. 3. Severe atherosclerotic calcifications at the origin of the SMA with a small area of poststenotic dilation. 4. Mild subpleural reticulation with some possible interlobular septal thickening. This could reflect mild pulmonary edema superimposed on a background of mild interstitial lung disease. Aortic Atherosclerosis (ICD10-I70.0). Electronically Signed   By: Corean Salter M.D.   On: 04/14/2024 13:45     Procedures   Medications Ordered in the ED  sodium chloride  0.9 % bolus 500 mL (0 mLs Intravenous Stopped 04/14/24 1243)  iohexol  (OMNIPAQUE ) 350 MG/ML injection 75 mL (75 mLs Intravenous Contrast Given 04/14/24 1328)  metoCLOPramide  (REGLAN ) injection 5 mg (5 mg Intravenous Given 04/14/24 1417)  sodium chloride  0.9 % bolus 500 mL (500 mLs Intravenous New Bag/Given 04/14/24 1437)    Clinical Course as of 04/14/24 1450  Sun Apr 14, 2024  1126 Comprehensive metabolic panel(!) No significant metabolic derangements.  Does have a slight elevation in her BUN and creatinine consistent with her reported nausea vomiting/dehydration.  No significant elevation in her liver  enzymes or bilirubin to suggest acute hepatobiliary process. [TY]  1127 Lactic Acid, Venous: 1.9 Overt bowel ischemia less likely [TY]  1127 CBC with Differential(!) No leukocytosis to suggest systemic infection.  No anemia [TY]  1159 ED EKG On my independent review appears to be sinus rhythm.  No ischemic changes.  Normal intervals.  QTc of 460 [TY]  1353 CT ABDOMEN PELVIS W CONTRAST MPRESSION: 1. There is persistent hypoenhancement of the superomedial pole of the RIGHT kidney. This is favored to reflect sequela of pyelonephritis. Recommend correlation with urinalysis. 2. Small hiatal hernia with circumferential wall thickening of the distal esophagus. This could reflect esophagitis. 3. Severe atherosclerotic calcifications at the origin of the SMA with a small area of poststenotic dilation. 4. Mild subpleural reticulation with some possible interlobular septal thickening. This could  reflect mild pulmonary edema superimposed on a background of mild interstitial lung disease.  Aortic Atherosclerosis (ICD10-I70.0).   Electronically Signed   By: Corean Salter M.D.   On: 04/14/2024 13:45   [TY]  1449 Patient still feeling nauseated.  Has received a liter of fluid.  Was taking Zofran , Phenergan  at home.  Given Reglan  here.  Still not tolerating p.o.  Will admit for her persistent nausea vomiting as she is unable to take her prescribed medications for C. difficile for her chronic medications.  Family also notes some generalized weakness and are having a difficult time caring for her. [TY]    Clinical Course User Index [TY] Neysa Caron PARAS, DO                                 Medical Decision Making Is a 84 year old female presenting emergency department with nausea vomiting.  She is afebrile nontachycardic, slightly hypertensive.  Nontoxic-appearing on exam, soft abdomen with no tenderness.  Does have some some tenderness to the left lower abdomen.  Per chart review admitted for  possible pyelonephritis on imaging, treated and subsequently developed C. difficile.  Family notes numerous episodes of vomiting and not keeping down her home medications.  Screening labs as noted in the ED course.  Will get CT scan given her continued nausea vomiting as well as for abdominal pain in the setting of C. Difficile; Rule out toxic megacolon.  Plan for IV fluids antiemetics and reevaluation.  See ED course for further MDM and disposition.  Amount and/or Complexity of Data Reviewed Labs: ordered. Decision-making details documented in ED Course. Radiology: ordered and independent interpretation performed. Decision-making details documented in ED Course. ECG/medicine tests: independent interpretation performed. Decision-making details documented in ED Course.    Details: No ischemic changes  Risk Prescription drug management. Decision regarding hospitalization. Diagnosis or treatment significantly limited by social determinants of health. Risk Details: Poor health literacy       Final diagnoses:  Nausea and vomiting, unspecified vomiting type    ED Discharge Orders     None          Neysa Caron PARAS, DO 04/14/24 1450  "

## 2024-04-15 ENCOUNTER — Other Ambulatory Visit (HOSPITAL_COMMUNITY): Payer: Self-pay

## 2024-04-15 ENCOUNTER — Telehealth (HOSPITAL_COMMUNITY): Payer: Self-pay

## 2024-04-15 DIAGNOSIS — A09 Infectious gastroenteritis and colitis, unspecified: Secondary | ICD-10-CM

## 2024-04-15 DIAGNOSIS — E669 Obesity, unspecified: Secondary | ICD-10-CM | POA: Diagnosis present

## 2024-04-15 DIAGNOSIS — A0472 Enterocolitis due to Clostridium difficile, not specified as recurrent: Secondary | ICD-10-CM

## 2024-04-15 DIAGNOSIS — Z8673 Personal history of transient ischemic attack (TIA), and cerebral infarction without residual deficits: Secondary | ICD-10-CM | POA: Diagnosis not present

## 2024-04-15 DIAGNOSIS — K529 Noninfective gastroenteritis and colitis, unspecified: Secondary | ICD-10-CM

## 2024-04-15 DIAGNOSIS — K4090 Unilateral inguinal hernia, without obstruction or gangrene, not specified as recurrent: Secondary | ICD-10-CM | POA: Diagnosis present

## 2024-04-15 DIAGNOSIS — E782 Mixed hyperlipidemia: Secondary | ICD-10-CM | POA: Diagnosis present

## 2024-04-15 DIAGNOSIS — R933 Abnormal findings on diagnostic imaging of other parts of digestive tract: Secondary | ICD-10-CM

## 2024-04-15 DIAGNOSIS — H409 Unspecified glaucoma: Secondary | ICD-10-CM | POA: Diagnosis present

## 2024-04-15 DIAGNOSIS — R32 Unspecified urinary incontinence: Secondary | ICD-10-CM | POA: Diagnosis present

## 2024-04-15 DIAGNOSIS — K449 Diaphragmatic hernia without obstruction or gangrene: Secondary | ICD-10-CM | POA: Diagnosis present

## 2024-04-15 DIAGNOSIS — K559 Vascular disorder of intestine, unspecified: Secondary | ICD-10-CM | POA: Diagnosis not present

## 2024-04-15 DIAGNOSIS — Z7982 Long term (current) use of aspirin: Secondary | ICD-10-CM | POA: Diagnosis not present

## 2024-04-15 DIAGNOSIS — B9620 Unspecified Escherichia coli [E. coli] as the cause of diseases classified elsewhere: Secondary | ICD-10-CM | POA: Diagnosis present

## 2024-04-15 DIAGNOSIS — N12 Tubulo-interstitial nephritis, not specified as acute or chronic: Secondary | ICD-10-CM | POA: Diagnosis present

## 2024-04-15 DIAGNOSIS — K3189 Other diseases of stomach and duodenum: Secondary | ICD-10-CM | POA: Diagnosis present

## 2024-04-15 DIAGNOSIS — E119 Type 2 diabetes mellitus without complications: Secondary | ICD-10-CM | POA: Diagnosis present

## 2024-04-15 DIAGNOSIS — Z1152 Encounter for screening for COVID-19: Secondary | ICD-10-CM | POA: Diagnosis not present

## 2024-04-15 DIAGNOSIS — D539 Nutritional anemia, unspecified: Secondary | ICD-10-CM | POA: Diagnosis present

## 2024-04-15 DIAGNOSIS — I2510 Atherosclerotic heart disease of native coronary artery without angina pectoris: Secondary | ICD-10-CM | POA: Diagnosis present

## 2024-04-15 DIAGNOSIS — I252 Old myocardial infarction: Secondary | ICD-10-CM | POA: Diagnosis not present

## 2024-04-15 DIAGNOSIS — F32A Depression, unspecified: Secondary | ICD-10-CM | POA: Diagnosis present

## 2024-04-15 DIAGNOSIS — K2100 Gastro-esophageal reflux disease with esophagitis, without bleeding: Secondary | ICD-10-CM | POA: Diagnosis present

## 2024-04-15 DIAGNOSIS — K551 Chronic vascular disorders of intestine: Secondary | ICD-10-CM | POA: Diagnosis present

## 2024-04-15 DIAGNOSIS — I10 Essential (primary) hypertension: Secondary | ICD-10-CM | POA: Diagnosis present

## 2024-04-15 DIAGNOSIS — M48062 Spinal stenosis, lumbar region with neurogenic claudication: Secondary | ICD-10-CM | POA: Diagnosis present

## 2024-04-15 DIAGNOSIS — R112 Nausea with vomiting, unspecified: Secondary | ICD-10-CM | POA: Diagnosis present

## 2024-04-15 DIAGNOSIS — M797 Fibromyalgia: Secondary | ICD-10-CM | POA: Diagnosis present

## 2024-04-15 DIAGNOSIS — Z7902 Long term (current) use of antithrombotics/antiplatelets: Secondary | ICD-10-CM | POA: Diagnosis not present

## 2024-04-15 DIAGNOSIS — I714 Abdominal aortic aneurysm, without rupture, unspecified: Secondary | ICD-10-CM | POA: Diagnosis present

## 2024-04-15 LAB — COMPREHENSIVE METABOLIC PANEL WITH GFR
ALT: 15 U/L (ref 0–44)
AST: 22 U/L (ref 15–41)
Albumin: 3.4 g/dL — ABNORMAL LOW (ref 3.5–5.0)
Alkaline Phosphatase: 79 U/L (ref 38–126)
Anion gap: 11 (ref 5–15)
BUN: 20 mg/dL (ref 8–23)
CO2: 22 mmol/L (ref 22–32)
Calcium: 8.9 mg/dL (ref 8.9–10.3)
Chloride: 102 mmol/L (ref 98–111)
Creatinine, Ser: 0.99 mg/dL (ref 0.44–1.00)
GFR, Estimated: 56 mL/min — ABNORMAL LOW
Glucose, Bld: 133 mg/dL — ABNORMAL HIGH (ref 70–99)
Potassium: 4.5 mmol/L (ref 3.5–5.1)
Sodium: 135 mmol/L (ref 135–145)
Total Bilirubin: 0.5 mg/dL (ref 0.0–1.2)
Total Protein: 6.7 g/dL (ref 6.5–8.1)

## 2024-04-15 LAB — CBC
HCT: 39 % (ref 36.0–46.0)
Hemoglobin: 13 g/dL (ref 12.0–15.0)
MCH: 35.2 pg — ABNORMAL HIGH (ref 26.0–34.0)
MCHC: 33.3 g/dL (ref 30.0–36.0)
MCV: 105.7 fL — ABNORMAL HIGH (ref 80.0–100.0)
Platelets: 165 K/uL (ref 150–400)
RBC: 3.69 MIL/uL — ABNORMAL LOW (ref 3.87–5.11)
RDW: 13.9 % (ref 11.5–15.5)
WBC: 9.5 K/uL (ref 4.0–10.5)
nRBC: 0 % (ref 0.0–0.2)

## 2024-04-15 MED ORDER — ONDANSETRON HCL 4 MG/2ML IJ SOLN
4.0000 mg | Freq: Four times a day (QID) | INTRAMUSCULAR | Status: DC
  Administered 2024-04-15 – 2024-04-18 (×13): 4 mg via INTRAVENOUS
  Filled 2024-04-15 (×13): qty 2

## 2024-04-15 MED ORDER — PANTOPRAZOLE SODIUM 40 MG PO TBEC
40.0000 mg | DELAYED_RELEASE_TABLET | Freq: Every day | ORAL | Status: DC
  Administered 2024-04-15 – 2024-04-16 (×2): 40 mg via ORAL
  Filled 2024-04-15 (×2): qty 1

## 2024-04-15 MED ORDER — METRONIDAZOLE 500 MG/100ML IV SOLN
500.0000 mg | Freq: Three times a day (TID) | INTRAVENOUS | Status: DC
  Administered 2024-04-15 – 2024-04-18 (×9): 500 mg via INTRAVENOUS
  Filled 2024-04-15 (×9): qty 100

## 2024-04-15 NOTE — Care Management Obs Status (Signed)
 MEDICARE OBSERVATION STATUS NOTIFICATION   Patient Details  Name: Cassandra Brooks MRN: [id number] Date of Birth: Dec 06, 1940   Medicare Observation Status Notification Given:  Yes Verbally reviewed Obs notice with the Member and a copy given   Claretta Deed 04/15/2024, 4:03 PM

## 2024-04-15 NOTE — Evaluation (Signed)
 Occupational Therapy Evaluation Patient Details Name: Cassandra Brooks MRN: [id number] DOB: 10/04/1940 Today's Date: 04/15/2024   History of Present Illness   84 y.o. female with medical history significant of hypertension, hyperlipidemia, CVA, diabetes, GERD, CAD, depression, glaucoma, cataracts, obesity, AAA, spinal stenosis, fibromyalgia presenting with nausea and vomiting.  Contact precautions.     Clinical Impressions Patient with recent admit, returned home with [facility] services.  OT to continue efforts in the acute setting to address deficits, Patient will benefit from continued inpatient follow up therapy, <3 hours/day.       If plan is discharge home, recommend the following:   A little help with bathing/dressing/bathroom;Assistance with cooking/housework;Help with stairs or ramp for entrance;Assist for transportation     Functional Status Assessment   Patient has had a recent decline in their functional status and demonstrates the ability to make significant improvements in function in a reasonable and predictable amount of time.     Equipment Recommendations   None recommended by OT     Recommendations for Other Services         Precautions/Restrictions   Precautions Precautions: Fall Recall of Precautions/Restrictions: Intact Precaution/Restrictions Comments: bowel urgency (wears depends) Restrictions Weight Bearing Restrictions Per Provider Order: No     Mobility Bed Mobility Overal bed mobility: Needs Assistance Bed Mobility: Supine to Sit     Supine to sit: Supervision          Transfers Overall transfer level: Needs assistance Equipment used: Rolling walker (2 wheels) Transfers: Sit to/from Stand, Bed to chair/wheelchair/BSC Sit to Stand: Supervision     Step pivot transfers: Supervision            Balance Overall balance assessment: Needs assistance Sitting-balance support: No upper extremity supported, Feet supported Sitting  balance-Leahy Scale: Good     Standing balance support: Reliant on assistive device for balance Standing balance-Leahy Scale: Fair                             ADL either performed or assessed with clinical judgement   ADL       Grooming: Supervision/safety;Standing;Wash/dry hands               Lower Body Dressing: Supervision/safety;Contact guard assist;Sit to/from stand   Toilet Transfer: Supervision/safety;Rolling walker (2 wheels);Regular Toilet                   Vision Patient Visual Report: No change from baseline       Perception Perception: Not tested       Praxis Praxis: Not tested       Pertinent Vitals/Pain Pain Assessment Pain Assessment: No/denies pain Pain Intervention(s): Monitored during session     Extremity/Trunk Assessment Upper Extremity Assessment Upper Extremity Assessment: Overall WFL for tasks assessed   Lower Extremity Assessment Lower Extremity Assessment: Defer to PT evaluation   Cervical / Trunk Assessment Cervical / Trunk Assessment: Normal   Communication Communication Communication: Impaired Factors Affecting Communication: Difficulty expressing self   Cognition Arousal: Alert Behavior During Therapy: WFL for tasks assessed/performed Cognition: No apparent impairments                               Following commands: Intact       Cueing  General Comments   Cueing Techniques: Verbal cues   vSS on RA   Exercises     Shoulder Instructions  Home Living Family/patient expects to be discharged to:: Private residence Living Arrangements: Spouse/significant other Available Help at Discharge: Family;Neighbor;Available 24 hours/day Type of Home: House Home Access: Stairs to enter;Ramped entrance Entrance Stairs-Number of Steps: 2 Entrance Stairs-Rails: Left Home Layout: One level     Bathroom Shower/Tub: Producer, Television/film/video: Handicapped height     Home  Equipment: Agricultural Consultant (2 wheels);Cane - quad;Cane - single point;BSC/3in1;Grab bars - tub/shower;Wheelchair - manual   Additional Comments: BSC over toilet. place a chair in their shower. lives with husband who uses RW since recent stroke      Prior Functioning/Environment Prior Level of Function : Independent/Modified Independent;History of Falls (last six months)             Mobility Comments: Furniture surfs in the home. SPC for community mobility. ADLs Comments: Mod I with ADLs most IADLs; receives assistance for transportation; Daughter also provides PRN assist for other IADLs.    OT Problem List: Decreased activity tolerance;Impaired balance (sitting and/or standing)   OT Treatment/Interventions: Self-care/ADL training;Energy conservation;Patient/family education      OT Goals(Current goals can be found in the care plan section)   Acute Rehab OT Goals Patient Stated Goal: Return home OT Goal Formulation: With patient Time For Goal Achievement: 04/29/24 Potential to Achieve Goals: Good ADL Goals Pt Will Perform Grooming: with modified independence;standing Pt Will Perform Lower Body Dressing: with modified independence;sit to/from stand Pt Will Transfer to Toilet: with modified independence;ambulating;regular height toilet   OT Frequency:  Min 2X/week    Co-evaluation              AM-PAC OT 6 Clicks Daily Activity     Outcome Measure Help from another person eating meals?: None Help from another person taking care of personal grooming?: A Little Help from another person toileting, which includes using toliet, bedpan, or urinal?: A Little Help from another person bathing (including washing, rinsing, drying)?: A Little Help from another person to put on and taking off regular upper body clothing?: None Help from another person to put on and taking off regular lower body clothing?: A Little 6 Click Score: 20   End of Session Equipment Utilized During  Treatment: Gait belt Nurse Communication: Mobility status  Activity Tolerance: Patient tolerated treatment well Patient left: in bed;with call bell/phone within reach  OT Visit Diagnosis: Other (comment);Unsteadiness on feet (R26.81)                Time: 9193-9172 OT Time Calculation (min): 21 min Charges:  OT General Charges $OT Visit: 1 Visit OT Evaluation $OT Eval Moderate Complexity: 1 Mod  04/15/2024  RP, OTR/L  Acute Rehabilitation Services  Office:  [phone]   Cassandra Brooks 04/15/2024, 9:10 AM

## 2024-04-15 NOTE — Evaluation (Signed)
 Physical Therapy Evaluation  Patient Details Name: Cassandra Brooks MRN: [id number] DOB: 23-Aug-1940 Today's Date: 04/15/2024  History of Present Illness  Pt is an 84 y/o female who presents 04/14/2024 with continued N/V after 2 recent admissions for the same. Pt was treated for pyelonephritis, E. Coli and C. Diff in previous admissions. PMH significant for CAD, HTN, TIA, AAA, STEMI, syncope, L TKA, lumbar spinal stenosis, DDD, fibromyalgia, glaucoma, depression, chronic fecal and urinary incontinence.   Clinical Impression  Pt admitted with above diagnosis. Pt currently with functional limitations due to the deficits listed below (see PT Problem List). At the time of PT eval pt was able to perform transfers and ambulation around room with CGA to supervision for safety with RW for support. Pt with 1 loose BM during session. Pt will benefit from acute skilled PT to increase their independence and safety with mobility to allow discharge.           If plan is discharge home, recommend the following: Assistance with cooking/housework;Assist for transportation;Help with stairs or ramp for entrance;A little help with walking and/or transfers;A little help with bathing/dressing/bathroom   Can travel by private vehicle        Equipment Recommendations None recommended by PT  Recommendations for Other Services       Functional Status Assessment Patient has had a recent decline in their functional status and demonstrates the ability to make significant improvements in function in a reasonable and predictable amount of time.     Precautions / Restrictions Precautions Precautions: Fall Recall of Precautions/Restrictions: Intact Precaution/Restrictions Comments: bowel urgency (wears depends) Restrictions Weight Bearing Restrictions Per Provider Order: No      Mobility  Bed Mobility               General bed mobility comments: Pt was received sitting up in the recliner     Transfers Overall transfer level: Needs assistance Equipment used: Rolling walker (2 wheels) Transfers: Sit to/from Stand Sit to Stand: Supervision           General transfer comment: Light supervision for safety. Able to stand without RW    Ambulation/Gait Ambulation/Gait assistance: Contact guard assist, Supervision Gait Distance (Feet): 25 Feet Assistive device: Rolling walker (2 wheels) Gait Pattern/deviations: Step-through pattern, Decreased stride length, Trunk flexed Gait velocity: Decreased Gait velocity interpretation: <1.8 ft/sec, indicate of risk for recurrent falls   General Gait Details: Around room only to/from bathroom and then to sink to wash hands. Pt with intermittent CGA to supervision for safety. Navigates around room well.  Stairs            Wheelchair Mobility     Tilt Bed    Modified Rankin (Stroke Patients Only)       Balance Overall balance assessment: Needs assistance Sitting-balance support: No upper extremity supported, Feet supported Sitting balance-Leahy Scale: Good     Standing balance support: Reliant on assistive device for balance Standing balance-Leahy Scale: Fair Standing balance comment: relies on at least 1 UE support                             Pertinent Vitals/Pain Pain Assessment Pain Assessment: Faces Faces Pain Scale: Hurts a little bit Pain Location: skin sore due to frequent diarrhea Pain Descriptors / Indicators: Sore Pain Intervention(s): Limited activity within patient's tolerance, Monitored during session, Repositioned    Home Living Family/patient expects to be discharged to:: Private residence Living Arrangements: Spouse/significant other Available  Help at Discharge: Family;Neighbor;Available 24 hours/day Type of Home: House Home Access: Stairs to enter;Ramped entrance Entrance Stairs-Rails: Left Entrance Stairs-Number of Steps: 2   Home Layout: One level Home Equipment: Clinical Biochemist (2 wheels);Cane - quad;Cane - single point;BSC/3in1;Grab bars - tub/shower;Wheelchair - manual Additional Comments: BSC over toilet. place a chair in their shower. lives with husband who uses RW since recent stroke    Prior Function Prior Level of Function : Independent/Modified Independent;History of Falls (last six months)             Mobility Comments: Furniture surfs in the home. SPC for community mobility. ADLs Comments: Mod I with ADLs most IADLs; receives assistance for transportation; Daughter also provides PRN assist for other IADLs.     Extremity/Trunk Assessment   Upper Extremity Assessment Upper Extremity Assessment: Overall WFL for tasks assessed    Lower Extremity Assessment Lower Extremity Assessment: Generalized weakness    Cervical / Trunk Assessment Cervical / Trunk Assessment: Normal  Communication   Communication Communication: Impaired Factors Affecting Communication: Difficulty expressing self    Cognition Arousal: Alert Behavior During Therapy: WFL for tasks assessed/performed   PT - Cognitive impairments: No apparent impairments                         Following commands: Intact       Cueing Cueing Techniques: Verbal cues     General Comments      Exercises     Assessment/Plan    PT Assessment Patient needs continued PT services  PT Problem List Decreased strength;Decreased range of motion       PT Treatment Interventions DME instruction;Gait training;Stair training;Functional mobility training;Therapeutic activities;Therapeutic exercise;Balance training;Patient/family education    PT Goals (Current goals can be found in the Care Plan section)  Acute Rehab PT Goals Patient Stated Goal: return home PT Goal Formulation: With patient Time For Goal Achievement: 04/22/24 Potential to Achieve Goals: Good    Frequency Min 1X/week     Co-evaluation               AM-PAC PT 6 Clicks Mobility  Outcome  Measure Help needed turning from your back to your side while in a flat bed without using bedrails?: None Help needed moving from lying on your back to sitting on the side of a flat bed without using bedrails?: None Help needed moving to and from a bed to a chair (including a wheelchair)?: A Little Help needed standing up from a chair using your arms (e.g., wheelchair or bedside chair)?: A Little Help needed to walk in hospital room?: A Little Help needed climbing 3-5 steps with a railing? : A Little 6 Click Score: 20    End of Session Equipment Utilized During Treatment: Gait belt Activity Tolerance: Patient tolerated treatment well Patient left: in chair;with call bell/phone within reach;with chair alarm set Nurse Communication: Mobility status PT Visit Diagnosis: Other abnormalities of gait and mobility (R26.89)    Time: 8982-8950 PT Time Calculation (min) (ACUTE ONLY): 32 min   Charges:   PT Evaluation $PT Eval Low Complexity: 1 Low PT Treatments $Gait Training: 8-22 mins PT General Charges $$ ACUTE PT VISIT: 1 Visit         Leita Sable, PT, DPT Acute Rehabilitation Services Secure Chat Preferred Office: [phone]   Leita JONETTA Sable 04/15/2024, 10:53 AM

## 2024-04-15 NOTE — H&P (View-Only) (Signed)
 "                                               Consultation Note   Referring Provider:   [facility] PCP: Erick Greig LABOR, NP Primary Gastroenterologist: Lynnie Bring, MD     Reason for Consultation:  Diarrhea, C-diff, nausea, vomiting DOA: 04/14/2024         Hospital Day: 2   Assessment and Plan:  84 year old female with recently diagnosed C-diff infection (  04/05/2024).   Started on Dificid  under direction of ID. Reportedly no improvement in diarrhea per husband.  Readmitted with nausea, vomiting and abdominal pain Unable to tolerate p.o. including oral meds over the last couple of days so has been unable to complete course of Dificid .  Patient is nontoxic-appearing with a normal white count .  No colonic wall thickening on CT scan which does show a moderate stool burden.  Unclear why the ongoing abdominal pain.  She has severe stenosis of SMA on CT scan but ? status of IMA ,celiac axis. Pain not necessarily food related and there hasn't been associated weight loss to suggest chronic mesenteric ischemia.  She describes the nausea and vomiting as being more of a chronic intermittent problem Infectious disease evaluating and has added Flagyl  to Dificid  (hopefully she will be able to tolerate the Flagyl  in the setting of nausea / vomiting) Schedule for EGD tomorrow ( note, has been on Plavix  prior to admission, last dose ~ yesterday). The risks and benefits of EGD with possible biopsies were discussed with the patient who agrees to proceed.  As needed antiemetics Continue daily pantoprazole  and daily famotidine   Chronic GERD Abnormal imaging of esophagus  CT scan - small hiatal hernia with distal wall thickening Further evaluation of esophagus at time of EGD  Chronic macrocytic anemia Hemoglobin stable  Chronic antiplatelet use Takes Plavix   History of CVA  CAD / hx of MI.   Type 2 DM  Glaucoma  FMH of colon cancer ( Father and brother) Normal colonoscopy in 2018  See PMH  for any additional medical history  / medical problems  Principal Problem:   Intractable nausea and vomiting Active Problems:   Glaucoma, both eyes   GERD (gastroesophageal reflux disease)   Fibromyalgia   Depression, major, recurrent, in complete remission   AAA (abdominal aortic aneurysm)   Cataract   Essential hypertension   Diabetes mellitus due to underlying condition with unspecified complications (HCC)   Spinal stenosis of lumbar region with neurogenic claudication   Mixed hyperlipidemia   CAD (coronary artery disease)   Obesity (BMI 30.0-34.9)   History of CVA (cerebrovascular accident)   C. difficile colitis      HPI   Brief History:  Patient has been followed by our office for bowel changes.  She was seen over the summer with diarrhea felt to be overflow diarrhea as x-rays had shown large stool burden for which she was treated with MiraLAX  and fiber .    Patient was hospitalized mid December 2025 with right pyelonephritis treated with Rocephin  followed by Augmentin  upon discharge on 12/16.  During that admission she complained of some intermittent loose stool which was treated with Imodium .   Patient readmitted 12/26 with abdominal pain and diarrhea. C-Diff study was positive. Also had E.Coli UTI but unclear if persistent from prior admission because urine culture had  not been done that admission. Infectious Disease was consulted and started Fidaxomycin and advised stopping systemic antibiotics. Discharged on 04/11/24.  ++++++++++++++++  Patient returned to ED yesterday with nausea / vomiting and generalized mid abdominal pain .  The diarrhea has not improved since starting the Dificid .  However over the last couple of days she has not been able to tolerate the Dificid  nor some of her other oral medications.  She is having several loose bowel movements a day. The nausea / vomiting sounds like it is more chronic in nature and has been going on intermittently for months . The  abdominal pain has been present since the first time she was admitted in late December with pyelonephritis .  The pain is sometimes related to eating, sometimes not.  She has not had any associated weight loss.    Patient has a history of GERD and was taking pantoprazole  twice daily but that was put on hold when diagnosed with C. Difficile (changed to famotidine ) . She has since been having some breakthrough GERD symptoms.  No dysphagia.     Admission studies / evaluation notable for:  Stable vital signs.  BUN 27, creatinine 1.09, normal LFTs, normal WBC hemoglobin 14.2  CTAP with contrast persistent hypoenhancement of the superomedial pole of the RIGHT kidney. This is favored to reflect sequela of pyelonephritis. Recommend correlation with urinalysis. Small hiatal hernia with circumferential wall thickening of the distal esophagus. This could reflect esophagitis.                                                   Recent Labs    04/14/24 0951 04/15/24 0433  PROT 7.6 6.7  ALBUMIN  3.8 3.4*  AST 21 22  ALT 18 15  ALKPHOS 93 79  BILITOT 0.4 0.5   Recent Labs    04/14/24 0951 04/15/24 0433  WBC 9.9 9.5  HGB 14.2 13.0  HCT 42.2 39.0  MCV 106.8* 105.7*  PLT 205 165   Recent Labs    04/14/24 0951 04/15/24 0433  NA 138 135  K 4.1 4.5  CL 98 102  CO2 27 22  GLUCOSE 150* 133*  BUN 27* 20  CREATININE 1.09* 0.99  CALCIUM  9.7 8.9    Prior Endoscopic Evaluations:       Review of Systems: All systems reviewed and negative except where noted in HPI.  Physical Exam: Vital signs in last 24 hours: Temp:  [98 F (36.7 C)-98.7 F (37.1 C)] 98 F (36.7 C) (01/05 0903) Pulse Rate:  [68-89] 75 (01/05 0903) Resp:  [12-19] 19 (01/05 0903) BP: (119-198)/(48-97) 151/70 (01/05 0903) SpO2:  [93 %-100 %] 97 % (01/05 0903)   General:  Pleasant female in NAD Psych:  Cooperative. Normal mood and affect Eyes: Pupils equal Ears:  Normal auditory acuity Nose: No deformity,  discharge or lesions Neck:  Supple, no masses felt Lungs:  Clear to auscultation.  Heart:  Regular rate, regular rhythm.  Abdomen:  Soft, nondistended, nontender, active bowel sounds, no masses felt Rectal :  Deferred Msk: Symmetrical without gross deformities.  Neurologic:  Alert, oriented, grossly normal neurologically Extremities : No edema Skin:  Intact without significant lesions.   OUTPATIENT MEDICATIONS Prior to Admission medications  Medication Sig Start Date End Date Taking? Authorizing Provider  acetaminophen  (TYLENOL ) 650 MG CR tablet Take 650 mg by mouth  2 (two) times daily.   Yes [provider]  amLODipine  (NORVASC ) 10 MG tablet Take 10 mg by mouth daily. 02/20/24  Yes [provider]  aspirin  EC 81 MG tablet Take 1 tablet (81 mg total) by mouth at bedtime. 08/05/23  Yes Dennise Lavada POUR, MD  atorvastatin  (LIPITOR ) 80 MG tablet Take 1 tablet (80 mg total) by mouth daily. 08/02/22  Yes Henry Manuelita NOVAK, NP  azelastine (ASTELIN) 0.1 % nasal spray Place 1-2 sprays into both nostrils 2 (two) times daily as needed for rhinitis or allergies.   Yes [provider]  baclofen  (LIORESAL ) 10 MG tablet Take 1 tablet (10 mg total) by mouth at bedtime as needed for muscle spasms. 04/01/24  Yes Williams, Megan E, NP  clopidogrel  (PLAVIX ) 75 MG tablet Take 1 tablet (75 mg total) by mouth daily. 08/05/23  Yes Dennise Lavada POUR, MD  diclofenac  (FLECTOR ) 1.3 % PTCH Place 1 patch onto the skin 2 (two) times daily. 04/11/24  Yes Samtani, Jai-Gurmukh, MD  diclofenac  Sodium (VOLTAREN ) 1 % GEL Apply 2 g topically 4 (four) times daily. 04/11/24  Yes Samtani, Jai-Gurmukh, MD  dorzolamide -timolol  (COSOPT ) 22.3-6.8 MG/ML ophthalmic solution Place 1 drop into both eyes 2 (two) times daily.   Yes [provider]  famotidine  (PEPCID ) 40 MG tablet Take 1 tablet (40 mg total) by mouth daily. 04/12/24  Yes Samtani, Jai-Gurmukh, MD  fidaxomicin  (DIFICID ) 200 MG TABS tablet Take 1  tablet (200 mg total) by mouth 2 (two) times daily for 5 days. 04/11/24 04/16/24 Yes Samtani, Jai-Gurmukh, MD  fluticasone  (FLONASE ) 50 MCG/ACT nasal spray Place 2 sprays into both nostrils daily. 11/08/19  Yes [provider]  metoprolol  tartrate (LOPRESSOR ) 50 MG tablet Take 1 tablet (50 mg total) by mouth 2 (two) times daily. 04/11/24  Yes Samtani, Jai-Gurmukh, MD  Multiple Vitamins-Minerals (MULTIVITAMIN WOMEN 50+) TABS Take 1 tablet by mouth daily.   Yes [provider]  nabumetone  (RELAFEN ) 750 MG tablet Take 750 mg by mouth 2 (two) times daily. 02/21/17  Yes [provider]  nitroGLYCERIN  (NITROSTAT ) 0.4 MG SL tablet Place 0.4 mg under the tongue every 5 (five) minutes as needed for chest pain.   Yes [provider]  ondansetron  (ZOFRAN -ODT) 4 MG disintegrating tablet Take 1 tablet (4 mg total) by mouth every 4 (four) hours as needed. 03/26/24  Yes Fausto Sor A, DO  Probiotic Product (PROBIOTIC PO) Take 1 capsule by mouth daily.   Yes [provider]  ROCKLATAN  0.02-0.005 % SOLN Apply 1 drop to eye at bedtime. 02/09/24  Yes [provider]  valsartan  (DIOVAN ) 160 MG tablet Take 1 tablet (160 mg total) by mouth at bedtime. 08/07/23  Yes Singh, Prashant K, MD  venlafaxine  XR (EFFEXOR -XR) 150 MG 24 hr capsule Take 1 capsule (150 mg total) by mouth daily with breakfast. 03/28/17  Yes Erle Hails, DO    Allergies as of 04/14/2024 - Review Complete 04/14/2024  Allergen Reaction Noted   Zestril  [lisinopril ] Cough 08/11/2022    INPATIENT MEDICATIONS Current Facility-Administered Medications  Medication Dose Route Frequency Provider Last Rate Last Admin   acetaminophen  (TYLENOL ) tablet 650 mg  650 mg Oral Q6H PRN Seena Marsa NOVAK, MD   650 mg at 04/14/24 1722   Or   acetaminophen  (TYLENOL ) suppository 650 mg  650 mg Rectal Q6H PRN Seena Marsa NOVAK, MD       acidophilus (RISAQUAD) capsule 1 capsule  1 capsule Oral Daily Seena Marsa NOVAK, MD   1  capsule at 04/15/24 0854   alum & mag hydroxide-simeth (MAALOX/MYLANTA) 200-200-20 MG/5ML suspension 30 mL  30 mL Oral Q6H PRN Sundil, Subrina, MD   30 mL at 04/14/24 2243   amLODipine  (NORVASC ) tablet 10 mg  10 mg Oral Daily Melvin, Alexander B, MD   10 mg at 04/15/24 0854   aspirin  EC tablet 81 mg  81 mg Oral QHS Melvin, Alexander B, MD   81 mg at 04/14/24 2245   atorvastatin  (LIPITOR ) tablet 80 mg  80 mg Oral Daily Melvin, Alexander B, MD   80 mg at 04/15/24 9144   clopidogrel  (PLAVIX ) tablet 75 mg  75 mg Oral Daily Melvin, Alexander B, MD   75 mg at 04/15/24 9144   diclofenac  (FLECTOR ) 1.3 % 1 patch  1 patch Transdermal BID Melvin, Alexander B, MD   1 patch at 04/15/24 1000   diclofenac  Sodium (VOLTAREN ) 1 % topical gel 2 g  2 g Topical QID Melvin, Alexander B, MD   2 g at 04/15/24 9141   dorzolamide -timolol  (COSOPT ) 2-0.5 % ophthalmic solution 1 drop  1 drop Both Eyes BID Melvin, Alexander B, MD   1 drop at 04/15/24 9141   enoxaparin  (LOVENOX ) injection 40 mg  40 mg Subcutaneous Q24H Melvin, Alexander B, MD   40 mg at 04/14/24 2245   famotidine  (PEPCID ) tablet 40 mg  40 mg Oral Daily Melvin, Alexander B, MD   40 mg at 04/15/24 9144   fidaxomicin  (DIFICID ) tablet 200 mg  200 mg Oral BID Melvin, Alexander B, MD   200 mg at 04/15/24 9140   irbesartan  (AVAPRO ) tablet 150 mg  150 mg Oral Daily Melvin, Alexander B, MD   150 mg at 04/15/24 9144   metoprolol  tartrate (LOPRESSOR ) tablet 50 mg  50 mg Oral BID Melvin, Alexander B, MD   50 mg at 04/15/24 9145   ondansetron  (ZOFRAN ) injection 4 mg  4 mg Intravenous Q6H Samtani, Jai-Gurmukh, MD       pantoprazole  (PROTONIX ) EC tablet 40 mg  40 mg Oral Daily Samtani, Jai-Gurmukh, MD       sodium chloride  flush (NS) 0.9 % injection 3 mL  3 mL Intravenous Q12H Seena Marsa NOVAK, MD   3 mL at 04/15/24 9140   venlafaxine  XR (EFFEXOR -XR) 24 hr capsule 150 mg  150 mg Oral Q breakfast Seena Marsa NOVAK, MD   150 mg at 04/15/24 9145      Past Medical History:  Diagnosis Date   AAA (abdominal aortic aneurysm)    ACS (acute coronary syndrome) (HCC) 08/01/2022   Arthritis    knees (03/21/2017)   Borderline diabetes    Bulging lumbar disc    CAD (coronary artery disease) 08/17/2022   Depression    Diabetes mellitus due to underlying condition with unspecified complications (HCC) 12/19/2019   Disequilibrium    Dizziness    Essential hypertension 12/19/2019   Fall    Fibromyalgia    GERD (gastroesophageal reflux disease)    Glaucoma, both eyes    Hemoptysis 03/18/2015   Hyperlipidemia    don't have it but I take RX (03/21/2017)   Hypertension    Mixed dyslipidemia 12/19/2019   Mixed hyperlipidemia 04/18/2017   Nuclear cataract of both eyes 01/01/2014   Obesity (BMI 30.0-34.9) 09/29/2022   Plantar fasciitis 10/21/2020   Posterior tibial tendon dysfunction (PTTD) of right lower extremity 11/16/2021   Pre-diabetes    pt states she is not diabetic. Last A1C was normal. Pt is on no medications for DM  Preoperative cardiovascular examination 12/19/2019   Primary osteoarthritis of left foot 10/21/2020   Primary osteoarthritis of left knee 07/18/2019   Solitary pulmonary nodule 03/18/2015   03/2015 6mm RLL nodule    Spinal stenosis of lumbar region with neurogenic claudication 04/26/2021   Spinal stenosis, lumbar region with neurogenic claudication 04/26/2021   Central stenosis, L2-3 and L3-4 with moderate foramenal narrowing L4-5   Status post AAA (abdominal aortic aneurysm) repair 12/19/2019   Status post total left knee replacement 03/27/2020   STEMI (ST elevation myocardial infarction) (HCC) 08/01/2022   STEMI involving right coronary artery (HCC) 07/29/2022   Syncope 03/21/2017   Syncope and collapse    TIA (transient ischemic attack) 08/03/2023    Past Surgical History:  Procedure Laterality Date   ABDOMINAL AORTIC ANEURYSM REPAIR  1966   APPENDECTOMY  1966   CARDIAC CATHETERIZATION     20+  years ago, no abnormalities found   COLONOSCOPY  11/02/2016   Dr Towana. A few diverticuli were seen in the sigmoid colon. Otherwise normal colonoscopy to cecum   CORONARY/GRAFT ACUTE MI REVASCULARIZATION N/A 07/29/2022   Procedure: Coronary/Graft Acute MI Revascularization;  Surgeon: Burnard Debby LABOR, MD;  Location[facility] INVASIVE CV LAB;  Service: Cardiovascular;  Laterality: N/A;   LEFT HEART CATH AND CORONARY ANGIOGRAPHY N/A 07/29/2022   Procedure: LEFT HEART CATH AND CORONARY ANGIOGRAPHY;  Surgeon: Burnard Debby LABOR, MD;  Location: [facility] INVASIVE CV LAB;  Service: Cardiovascular;  Laterality: N/A;   LUMBAR LAMINECTOMY/DECOMPRESSION MICRODISCECTOMY N/A 04/26/2021   Procedure: CENTRAL LAMINECTOMIES LUMBAR TWO-THREE,  LUMBAR THREE-FOUR WITH BILATERAL FORAMINOTOMIES;  Surgeon: Lucilla Lynwood BRAVO, MD;  Location: [facility] OR;  Service: Orthopedics;  Laterality: N/A;   TOTAL KNEE ARTHROPLASTY Left 03/27/2020   Procedure: LEFT TOTAL KNEE ARTHROPLASTY;  Surgeon: Jerri Kay HERO, MD;  Location: [facility] OR;  Service: Orthopedics;  Laterality: Left;   VAGINAL HYSTERECTOMY     partial   VIDEO BRONCHOSCOPY Bilateral 03/19/2015   Procedure: VIDEO BRONCHOSCOPY WITHOUT FLUORO;  Surgeon: Harden Jude GAILS, MD;  Location: [facility] ENDOSCOPY;  Service: Cardiopulmonary;  Laterality: Bilateral;    Family History  Problem Relation Age of Onset   Hypertension Father    Stomach cancer Father    Glaucoma Mother    Diabetes Mother    Diabetes Sister    Diabetes Brother    Lupus Child    Diabetes Brother    Lung cancer Brother    Colon cancer Brother     Social History   Socioeconomic History   Marital status: Married    Spouse name: Not on file   Number of children: Not on file   Years of education: Not on file   Highest education level: Not on file  Occupational History   Occupation: retired    Comment: designer, fashion/clothing; dietician   Occupation: retired  Tobacco Use   Smoking status: Never   Smokeless tobacco: Never  Vaping Use    Vaping status: Never Used  Substance and Sexual Activity   Alcohol use: No   Drug use: No   Sexual activity: Not on file  Other Topics Concern   Not on file  Social History Narrative   Not on file   Social Drivers of Health   Tobacco Use: Low Risk (04/14/2024)   Patient History    Smoking Tobacco Use: Never    Smokeless Tobacco Use: Never    Passive Exposure: Not on file  Financial Resource Strain: Not on file  Food Insecurity: No Food Insecurity (04/14/2024)  Epic    Worried About Programme Researcher, Broadcasting/film/video in the Last Year: Never true    [employer] of Food in the Last Year: Never true  Transportation Needs: No Transportation Needs (04/14/2024)   Epic    Lack of Transportation (Medical): No    Lack of Transportation (Non-Medical): No  Physical Activity: Not on file  Stress: Not on file  Social Connections: Socially Integrated (04/14/2024)   Social Connection and Isolation Panel    Frequency of Communication with Friends and Family: Three times a week    Frequency of Social Gatherings with Friends and Family: Three times a week    Attends Religious Services: More than 4 times per year    Active Member of Clubs or Organizations: Yes    Attends Banker Meetings: 1 to 4 times per year    Marital Status: Married  Catering Manager Violence: Not At Risk (04/14/2024)   Epic    Fear of Current or Ex-Partner: No    Emotionally Abused: No    Physically Abused: No    Sexually Abused: No  Depression (PHQ2-9): Not on file  Alcohol Screen: Not on file  Housing: Low Risk (04/14/2024)   Epic    Unable to Pay for Housing in the Last Year: No    Number of Times Moved in the Last Year: 0    Homeless in the Last Year: No  Utilities: Not At Risk (04/14/2024)   Epic    Threatened with loss of utilities: No  Health Literacy: Not on file    Code Status   Code Status: Full Code   Vina Dasen, NP-C   04/15/2024, 1:34 PM  ---------------------------------------------------------  I have  taken a history, reviewed the chart and examined the patient. I performed a substantive portion of this encounter, including complete performance of at least one of the key components, in conjunction with the APP. I agree with the APP's note, impression and recommendations  84 year old female with history of coronary artery disease/history of MI, recent stroke (April 2025), diabetes and over 1 year history of chronic diarrhea with fecal incontinence, with multiple admissions in the past month with symptoms of flank/back pain, nausea, vomiting and diarrhea.  Previous infectious for chronic diarrhea was notable only for Giardia.  C. difficile was negative this time. When she was admitted a few weeks ago with worsening diarrhea, her C. difficile toxin and antigen were positive and she was treated with fidaxomicin . Per her husband, she was not able to keep any of the fidaxomicin  down after discharge. He reports that her diarrhea has not improved at all, she continues to have multiple loose and dark bowel movements, frequently with incontinence.  She continues to have recurrent nausea and vomiting and p.o. intolerance which has been ongoing for several weeks now.  CT scan showed thickening of the distal esophagus suggestive of esophagitis.  A previous CT scan earlier in the month demonstrated presence of food contents in the esophagus.  Her CT also demonstrated severe atherosclerosis of the SMA with poststenotic dilation.  She denies significant postprandial abdominal pain, sitophobia or weight loss.  Low suspicion that the patient's symptoms are due to chronic intestinal ischemia.  Although her CT scan also showed stool throughout the colon, her symptoms of multiple watery bowel movements in the setting of recent new [name] are more likely to be related to undertreated C. difficile due to p.o. intolerance, rather than an alternative etiologies such as overflow diarrhea.  Infectious  disease has evaluated the patient and has recommended adding Flagyl  in addition to continuing fidaxomicin .  I agree with his recommendation.  To evaluate her p.o. intolerance as well as the esophageal abnormalities on CT, we will perform an upper endoscopy tomorrow.  As patient is on Plavix , evaluation will be limited to diagnostic capabilities of visualization and small biopsies.  No dilation will be performed.    Soleia Badolato E. Stacia, MD [facility]  I spent a total of 65 minutes reviewing the patient's medical record, interviewing and examining the patient, discussing her diagnosis and management of her condition going forward, and documenting in the medical record   "

## 2024-04-15 NOTE — Progress Notes (Addendum)
 [facility]  Dillon Mcreynolds FMW:[id number]  DOB: Apr 15, 1940  DOA: 04/14/2024  PCP: Erick No A, NP  04/15/2024,11:18 AM  LOS: 0 days    Code Status: Full code     from: Home   84 year old female CAD cath 07/29/2022 total occlusion small posterior descending branch RCA previously aspirin  Brilinta -with complication of pain in the chest likely GI related Fibromyalgia Depression HTN HLD Known history of chronic fecal incontinence?  Overflow followed by Dr. Charlanne previously on FiberCon and Imodium    Admit 12/11-12/16 2025 progressive LBP nausea vomiting-CT ABD pelvis = right-sided pyelo-Rx Rocephin  2 g daily-diarrhea during hospital stay thought to be noninfectious patient discharged home on Amoxil -- 12/27 came in similar complaint nonbilious nonbloody diarrhea subjective fever chills CT abdomen pelvis residual right-sided pyelo blood culture X2 ordered 12/26, urine culture pending E. coli noted C. difficile also positive   Had sinus tach prompting echocardiogram-echo 12/29 EF 60-65% grade 1 DD ID consulted for evaluation of infectious symptoms-patient was discharged with recommendations for full course of fidamoxixin after discussion with ID scripts were given I told family to discontinue Imodium  and Protonix   1/1 returns with vomiting was taking Zofran  and Phenergan  without benefit apparently at home and no relief BUN/creatinine 27/1.0 CT abdomen pelvis persistent right kidney changes prior pyelonephritis small inguinal hernia distal esophageal thickening?  Esophagitis severe stenosis-edema/ILD Patient received Reglan  Zofran  and 1 L of fluids in the ED   Assessment  & Plan :    Intractable nausea vomiting Patient could not tolerate any p.o. and CT shows esophagitis-note that patient had been taken off of Protonix  last admission because of risk for worsening C. difficile Will add small dose of Protonix  40 daily knowing the risk of C. difficile and will monitor trends and see if she is able to  eat--continue Pepcid  40 daily, continue Zofran  4 mg Q6 scheduled for now and reassess tomorrow, continue Mylanta 30 every 6 as needed indigestion Do not give any further Reglan  or MiraLAX  as this will obfuscate things Might need to get GI input from [place] in terms of next steps-not sure if she needs a scope but with the amount of vomiting and nausea and esophagitis she might   C. difficile colitis I will ask ID to come by and discuss planning-with stool in the colon on CT it is not really clear that she has overt C. difficile and I am not sure if she needs any further treatment  Urinary stream colonization with pyelo on one of the CTs Treated comprehensively with Rocephin  and then a 10-day course of Amoxil  last admissions Would defer to ID I do not think we need to treat further Continue acidophilus 1 cap daily  CAD cardiac cath 2024 RCA stent Continue atorvastatin  80 a VaPro 150 amlodipine  10 and metoprolol  50 twice daily if she is able to take it otherwise may have to hold--continue Plavix  75 No chest pain currently  ?  Esophagitis with nausea vomiting ?  IBS-underlying fibromyalgia Known history of fecal incontinence previously on Imodium /FiberCon and followed with Dr. Charlanne in the past I have asked GI to see the patient as I am starting back Protonix  and she has an esophagitis-I have started meds as above discussion  Low back pain spinal stenosis Continue Flector  patch--- apparently not taking baclofen  at home for spasm the 10 mg not sure if we should resume and I have stopped it  Sinus tachycardia likely SVT previously Continue metoprolol  50 twice daily and keep on monitors today   Data Reviewed  today: Sodium 135 potassium 4.5 BUN/creatinine 20/0.9 AST/ALT 22/15-WBC 9.5 hemoglobin 13 platelet 165   DVT prophylaxis: Lovenox    Dispo/Global plan: Unclear waiting on several consultants to see and give opinions     Subjective:   Saw patient's husband in the hall this morning  and he proceeded to brief me about sending her home too early at the last hospital visit See separate nursing charting regarding interaction with patient/family.  Patient herself looks fair does not seem to be in distress-documented per chartingonly 1-2 episodes of FORMED STOOL --she is not nauseous and  she thinks that she can eat solids She has no abdominal pain no chills no rigor and thinks that she can eat solids   Objective + exam Vitals:   04/14/24 2039 04/15/24 0441 04/15/24 0444 04/15/24 0903  BP: (!) 178/61 (!) 127/48 (!) 127/48 (!) 151/70  Pulse: 89 71 68 75  Resp: 19 19 19 19   Temp: 98.7 F (37.1 C) 98.3 F (36.8 C) 98.3 F (36.8 C) 98 F (36.7 C)  TempSrc: Oral Oral Oral   SpO2: 95% 93% 93% 97%   There were no vitals filed for this visit.   Examination: Awake alert no distress sitting up in chair--S1-S2 no murmur Abdomen slight distention no rebound no guarding No lower extremity edema ROM intact Power 5/5  Scheduled Meds:  acidophilus  1 capsule Oral Daily   amLODipine   10 mg Oral Daily   aspirin  EC  81 mg Oral QHS   atorvastatin   80 mg Oral Daily   clopidogrel   75 mg Oral Daily   diclofenac   1 patch Transdermal BID   diclofenac  Sodium  2 g Topical QID   dorzolamide -timolol   1 drop Both Eyes BID   enoxaparin  (LOVENOX ) injection  40 mg Subcutaneous Q24H   famotidine   40 mg Oral Daily   fidaxomicin   200 mg Oral BID   irbesartan   150 mg Oral Daily   metoprolol  tartrate  50 mg Oral BID   ondansetron  (ZOFRAN ) IV  4 mg Intravenous Once   sodium chloride  flush  3 mL Intravenous Q12H   venlafaxine  XR  150 mg Oral Q breakfast   Continuous Infusions: acetaminophen  **OR** acetaminophen , alum & mag hydroxide-simeth, polyethylene glycol  Time 60  Jai-Gurmukh Drayce Tawil, MD  [facility]

## 2024-04-15 NOTE — Consult Note (Addendum)
 "                                               Consultation Note   Referring Provider:   [facility] PCP: Erick Greig LABOR, NP Primary Gastroenterologist: Lynnie Bring, MD     Reason for Consultation:  Diarrhea, C-diff, nausea, vomiting DOA: 04/14/2024         Hospital Day: 2   Assessment and Plan:  84 year old female with recently diagnosed C-diff infection (  04/05/2024).   Started on Dificid  under direction of ID. Reportedly no improvement in diarrhea per husband.  Readmitted with nausea, vomiting and abdominal pain Unable to tolerate p.o. including oral meds over the last couple of days so has been unable to complete course of Dificid .  Patient is nontoxic-appearing with a normal white count .  No colonic wall thickening on CT scan which does show a moderate stool burden.  Unclear why the ongoing abdominal pain.  She has severe stenosis of SMA on CT scan but ? status of IMA ,celiac axis. Pain not necessarily food related and there hasn't been associated weight loss to suggest chronic mesenteric ischemia.  She describes the nausea and vomiting as being more of a chronic intermittent problem Infectious disease evaluating and has added Flagyl  to Dificid  (hopefully she will be able to tolerate the Flagyl  in the setting of nausea / vomiting) Schedule for EGD tomorrow ( note, has been on Plavix  prior to admission, last dose ~ yesterday). The risks and benefits of EGD with possible biopsies were discussed with the patient who agrees to proceed.  As needed antiemetics Continue daily pantoprazole  and daily famotidine   Chronic GERD Abnormal imaging of esophagus  CT scan - small hiatal hernia with distal wall thickening Further evaluation of esophagus at time of EGD  Chronic macrocytic anemia Hemoglobin stable  Chronic antiplatelet use Takes Plavix   History of CVA  CAD / hx of MI.   Type 2 DM  Glaucoma  FMH of colon cancer ( Father and brother) Normal colonoscopy in 2018  See PMH  for any additional medical history  / medical problems  Principal Problem:   Intractable nausea and vomiting Active Problems:   Glaucoma, both eyes   GERD (gastroesophageal reflux disease)   Fibromyalgia   Depression, major, recurrent, in complete remission   AAA (abdominal aortic aneurysm)   Cataract   Essential hypertension   Diabetes mellitus due to underlying condition with unspecified complications (HCC)   Spinal stenosis of lumbar region with neurogenic claudication   Mixed hyperlipidemia   CAD (coronary artery disease)   Obesity (BMI 30.0-34.9)   History of CVA (cerebrovascular accident)   C. difficile colitis      HPI   Brief History:  Patient has been followed by our office for bowel changes.  She was seen over the summer with diarrhea felt to be overflow diarrhea as x-rays had shown large stool burden for which she was treated with MiraLAX  and fiber .    Patient was hospitalized mid December 2025 with right pyelonephritis treated with Rocephin  followed by Augmentin  upon discharge on 12/16.  During that admission she complained of some intermittent loose stool which was treated with Imodium .   Patient readmitted 12/26 with abdominal pain and diarrhea. C-Diff study was positive. Also had E.Coli UTI but unclear if persistent from prior admission because urine culture had  not been done that admission. Infectious Disease was consulted and started Fidaxomycin and advised stopping systemic antibiotics. Discharged on 04/11/24.  ++++++++++++++++  Patient returned to ED yesterday with nausea / vomiting and generalized mid abdominal pain .  The diarrhea has not improved since starting the Dificid .  However over the last couple of days she has not been able to tolerate the Dificid  nor some of her other oral medications.  She is having several loose bowel movements a day. The nausea / vomiting sounds like it is more chronic in nature and has been going on intermittently for months . The  abdominal pain has been present since the first time she was admitted in late December with pyelonephritis .  The pain is sometimes related to eating, sometimes not.  She has not had any associated weight loss.    Patient has a history of GERD and was taking pantoprazole  twice daily but that was put on hold when diagnosed with C. Difficile (changed to famotidine ) . She has since been having some breakthrough GERD symptoms.  No dysphagia.     Admission studies / evaluation notable for:  Stable vital signs.  BUN 27, creatinine 1.09, normal LFTs, normal WBC hemoglobin 14.2  CTAP with contrast persistent hypoenhancement of the superomedial pole of the RIGHT kidney. This is favored to reflect sequela of pyelonephritis. Recommend correlation with urinalysis. Small hiatal hernia with circumferential wall thickening of the distal esophagus. This could reflect esophagitis.                                                   Recent Labs    04/14/24 0951 04/15/24 0433  PROT 7.6 6.7  ALBUMIN  3.8 3.4*  AST 21 22  ALT 18 15  ALKPHOS 93 79  BILITOT 0.4 0.5   Recent Labs    04/14/24 0951 04/15/24 0433  WBC 9.9 9.5  HGB 14.2 13.0  HCT 42.2 39.0  MCV 106.8* 105.7*  PLT 205 165   Recent Labs    04/14/24 0951 04/15/24 0433  NA 138 135  K 4.1 4.5  CL 98 102  CO2 27 22  GLUCOSE 150* 133*  BUN 27* 20  CREATININE 1.09* 0.99  CALCIUM  9.7 8.9    Prior Endoscopic Evaluations:       Review of Systems: All systems reviewed and negative except where noted in HPI.  Physical Exam: Vital signs in last 24 hours: Temp:  [98 F (36.7 C)-98.7 F (37.1 C)] 98 F (36.7 C) (01/05 0903) Pulse Rate:  [68-89] 75 (01/05 0903) Resp:  [12-19] 19 (01/05 0903) BP: (119-198)/(48-97) 151/70 (01/05 0903) SpO2:  [93 %-100 %] 97 % (01/05 0903)   General:  Pleasant female in NAD Psych:  Cooperative. Normal mood and affect Eyes: Pupils equal Ears:  Normal auditory acuity Nose: No deformity,  discharge or lesions Neck:  Supple, no masses felt Lungs:  Clear to auscultation.  Heart:  Regular rate, regular rhythm.  Abdomen:  Soft, nondistended, nontender, active bowel sounds, no masses felt Rectal :  Deferred Msk: Symmetrical without gross deformities.  Neurologic:  Alert, oriented, grossly normal neurologically Extremities : No edema Skin:  Intact without significant lesions.   OUTPATIENT MEDICATIONS Prior to Admission medications  Medication Sig Start Date End Date Taking? Authorizing Provider  acetaminophen  (TYLENOL ) 650 MG CR tablet Take 650 mg by mouth  2 (two) times daily.   Yes [provider]  amLODipine  (NORVASC ) 10 MG tablet Take 10 mg by mouth daily. 02/20/24  Yes [provider]  aspirin  EC 81 MG tablet Take 1 tablet (81 mg total) by mouth at bedtime. 08/05/23  Yes Dennise Lavada POUR, MD  atorvastatin  (LIPITOR ) 80 MG tablet Take 1 tablet (80 mg total) by mouth daily. 08/02/22  Yes Henry Manuelita NOVAK, NP  azelastine (ASTELIN) 0.1 % nasal spray Place 1-2 sprays into both nostrils 2 (two) times daily as needed for rhinitis or allergies.   Yes [provider]  baclofen  (LIORESAL ) 10 MG tablet Take 1 tablet (10 mg total) by mouth at bedtime as needed for muscle spasms. 04/01/24  Yes Williams, Megan E, NP  clopidogrel  (PLAVIX ) 75 MG tablet Take 1 tablet (75 mg total) by mouth daily. 08/05/23  Yes Dennise Lavada POUR, MD  diclofenac  (FLECTOR ) 1.3 % PTCH Place 1 patch onto the skin 2 (two) times daily. 04/11/24  Yes Samtani, Jai-Gurmukh, MD  diclofenac  Sodium (VOLTAREN ) 1 % GEL Apply 2 g topically 4 (four) times daily. 04/11/24  Yes Samtani, Jai-Gurmukh, MD  dorzolamide -timolol  (COSOPT ) 22.3-6.8 MG/ML ophthalmic solution Place 1 drop into both eyes 2 (two) times daily.   Yes [provider]  famotidine  (PEPCID ) 40 MG tablet Take 1 tablet (40 mg total) by mouth daily. 04/12/24  Yes Samtani, Jai-Gurmukh, MD  fidaxomicin  (DIFICID ) 200 MG TABS tablet Take 1  tablet (200 mg total) by mouth 2 (two) times daily for 5 days. 04/11/24 04/16/24 Yes Samtani, Jai-Gurmukh, MD  fluticasone  (FLONASE ) 50 MCG/ACT nasal spray Place 2 sprays into both nostrils daily. 11/08/19  Yes [provider]  metoprolol  tartrate (LOPRESSOR ) 50 MG tablet Take 1 tablet (50 mg total) by mouth 2 (two) times daily. 04/11/24  Yes Samtani, Jai-Gurmukh, MD  Multiple Vitamins-Minerals (MULTIVITAMIN WOMEN 50+) TABS Take 1 tablet by mouth daily.   Yes [provider]  nabumetone  (RELAFEN ) 750 MG tablet Take 750 mg by mouth 2 (two) times daily. 02/21/17  Yes [provider]  nitroGLYCERIN  (NITROSTAT ) 0.4 MG SL tablet Place 0.4 mg under the tongue every 5 (five) minutes as needed for chest pain.   Yes [provider]  ondansetron  (ZOFRAN -ODT) 4 MG disintegrating tablet Take 1 tablet (4 mg total) by mouth every 4 (four) hours as needed. 03/26/24  Yes Fausto Sor A, DO  Probiotic Product (PROBIOTIC PO) Take 1 capsule by mouth daily.   Yes [provider]  ROCKLATAN  0.02-0.005 % SOLN Apply 1 drop to eye at bedtime. 02/09/24  Yes [provider]  valsartan  (DIOVAN ) 160 MG tablet Take 1 tablet (160 mg total) by mouth at bedtime. 08/07/23  Yes Singh, Prashant K, MD  venlafaxine  XR (EFFEXOR -XR) 150 MG 24 hr capsule Take 1 capsule (150 mg total) by mouth daily with breakfast. 03/28/17  Yes Erle Hails, DO    Allergies as of 04/14/2024 - Review Complete 04/14/2024  Allergen Reaction Noted   Zestril  [lisinopril ] Cough 08/11/2022    INPATIENT MEDICATIONS Current Facility-Administered Medications  Medication Dose Route Frequency Provider Last Rate Last Admin   acetaminophen  (TYLENOL ) tablet 650 mg  650 mg Oral Q6H PRN Seena Marsa NOVAK, MD   650 mg at 04/14/24 1722   Or   acetaminophen  (TYLENOL ) suppository 650 mg  650 mg Rectal Q6H PRN Seena Marsa NOVAK, MD       acidophilus (RISAQUAD) capsule 1 capsule  1 capsule Oral Daily Seena Marsa NOVAK, MD   1  capsule at 04/15/24 0854   alum & mag hydroxide-simeth (MAALOX/MYLANTA) 200-200-20 MG/5ML suspension 30 mL  30 mL Oral Q6H PRN Sundil, Subrina, MD   30 mL at 04/14/24 2243   amLODipine  (NORVASC ) tablet 10 mg  10 mg Oral Daily Melvin, Alexander B, MD   10 mg at 04/15/24 0854   aspirin  EC tablet 81 mg  81 mg Oral QHS Melvin, Alexander B, MD   81 mg at 04/14/24 2245   atorvastatin  (LIPITOR ) tablet 80 mg  80 mg Oral Daily Melvin, Alexander B, MD   80 mg at 04/15/24 9144   clopidogrel  (PLAVIX ) tablet 75 mg  75 mg Oral Daily Melvin, Alexander B, MD   75 mg at 04/15/24 9144   diclofenac  (FLECTOR ) 1.3 % 1 patch  1 patch Transdermal BID Melvin, Alexander B, MD   1 patch at 04/15/24 1000   diclofenac  Sodium (VOLTAREN ) 1 % topical gel 2 g  2 g Topical QID Melvin, Alexander B, MD   2 g at 04/15/24 9141   dorzolamide -timolol  (COSOPT ) 2-0.5 % ophthalmic solution 1 drop  1 drop Both Eyes BID Melvin, Alexander B, MD   1 drop at 04/15/24 9141   enoxaparin  (LOVENOX ) injection 40 mg  40 mg Subcutaneous Q24H Melvin, Alexander B, MD   40 mg at 04/14/24 2245   famotidine  (PEPCID ) tablet 40 mg  40 mg Oral Daily Melvin, Alexander B, MD   40 mg at 04/15/24 9144   fidaxomicin  (DIFICID ) tablet 200 mg  200 mg Oral BID Melvin, Alexander B, MD   200 mg at 04/15/24 9140   irbesartan  (AVAPRO ) tablet 150 mg  150 mg Oral Daily Melvin, Alexander B, MD   150 mg at 04/15/24 9144   metoprolol  tartrate (LOPRESSOR ) tablet 50 mg  50 mg Oral BID Melvin, Alexander B, MD   50 mg at 04/15/24 9145   ondansetron  (ZOFRAN ) injection 4 mg  4 mg Intravenous Q6H Samtani, Jai-Gurmukh, MD       pantoprazole  (PROTONIX ) EC tablet 40 mg  40 mg Oral Daily Samtani, Jai-Gurmukh, MD       sodium chloride  flush (NS) 0.9 % injection 3 mL  3 mL Intravenous Q12H Seena Marsa NOVAK, MD   3 mL at 04/15/24 9140   venlafaxine  XR (EFFEXOR -XR) 24 hr capsule 150 mg  150 mg Oral Q breakfast Seena Marsa NOVAK, MD   150 mg at 04/15/24 9145      Past Medical History:  Diagnosis Date   AAA (abdominal aortic aneurysm)    ACS (acute coronary syndrome) (HCC) 08/01/2022   Arthritis    knees (03/21/2017)   Borderline diabetes    Bulging lumbar disc    CAD (coronary artery disease) 08/17/2022   Depression    Diabetes mellitus due to underlying condition with unspecified complications (HCC) 12/19/2019   Disequilibrium    Dizziness    Essential hypertension 12/19/2019   Fall    Fibromyalgia    GERD (gastroesophageal reflux disease)    Glaucoma, both eyes    Hemoptysis 03/18/2015   Hyperlipidemia    don't have it but I take RX (03/21/2017)   Hypertension    Mixed dyslipidemia 12/19/2019   Mixed hyperlipidemia 04/18/2017   Nuclear cataract of both eyes 01/01/2014   Obesity (BMI 30.0-34.9) 09/29/2022   Plantar fasciitis 10/21/2020   Posterior tibial tendon dysfunction (PTTD) of right lower extremity 11/16/2021   Pre-diabetes    pt states she is not diabetic. Last A1C was normal. Pt is on no medications for DM  Preoperative cardiovascular examination 12/19/2019   Primary osteoarthritis of left foot 10/21/2020   Primary osteoarthritis of left knee 07/18/2019   Solitary pulmonary nodule 03/18/2015   03/2015 6mm RLL nodule    Spinal stenosis of lumbar region with neurogenic claudication 04/26/2021   Spinal stenosis, lumbar region with neurogenic claudication 04/26/2021   Central stenosis, L2-3 and L3-4 with moderate foramenal narrowing L4-5   Status post AAA (abdominal aortic aneurysm) repair 12/19/2019   Status post total left knee replacement 03/27/2020   STEMI (ST elevation myocardial infarction) (HCC) 08/01/2022   STEMI involving right coronary artery (HCC) 07/29/2022   Syncope 03/21/2017   Syncope and collapse    TIA (transient ischemic attack) 08/03/2023    Past Surgical History:  Procedure Laterality Date   ABDOMINAL AORTIC ANEURYSM REPAIR  1966   APPENDECTOMY  1966   CARDIAC CATHETERIZATION     20+  years ago, no abnormalities found   COLONOSCOPY  11/02/2016   Dr Towana. A few diverticuli were seen in the sigmoid colon. Otherwise normal colonoscopy to cecum   CORONARY/GRAFT ACUTE MI REVASCULARIZATION N/A 07/29/2022   Procedure: Coronary/Graft Acute MI Revascularization;  Surgeon: Burnard Debby LABOR, MD;  Location[facility] INVASIVE CV LAB;  Service: Cardiovascular;  Laterality: N/A;   LEFT HEART CATH AND CORONARY ANGIOGRAPHY N/A 07/29/2022   Procedure: LEFT HEART CATH AND CORONARY ANGIOGRAPHY;  Surgeon: Burnard Debby LABOR, MD;  Location: [facility] INVASIVE CV LAB;  Service: Cardiovascular;  Laterality: N/A;   LUMBAR LAMINECTOMY/DECOMPRESSION MICRODISCECTOMY N/A 04/26/2021   Procedure: CENTRAL LAMINECTOMIES LUMBAR TWO-THREE,  LUMBAR THREE-FOUR WITH BILATERAL FORAMINOTOMIES;  Surgeon: Lucilla Lynwood BRAVO, MD;  Location: [facility] OR;  Service: Orthopedics;  Laterality: N/A;   TOTAL KNEE ARTHROPLASTY Left 03/27/2020   Procedure: LEFT TOTAL KNEE ARTHROPLASTY;  Surgeon: Jerri Kay HERO, MD;  Location: [facility] OR;  Service: Orthopedics;  Laterality: Left;   VAGINAL HYSTERECTOMY     partial   VIDEO BRONCHOSCOPY Bilateral 03/19/2015   Procedure: VIDEO BRONCHOSCOPY WITHOUT FLUORO;  Surgeon: Harden Jude GAILS, MD;  Location: [facility] ENDOSCOPY;  Service: Cardiopulmonary;  Laterality: Bilateral;    Family History  Problem Relation Age of Onset   Hypertension Father    Stomach cancer Father    Glaucoma Mother    Diabetes Mother    Diabetes Sister    Diabetes Brother    Lupus Child    Diabetes Brother    Lung cancer Brother    Colon cancer Brother     Social History   Socioeconomic History   Marital status: Married    Spouse name: Not on file   Number of children: Not on file   Years of education: Not on file   Highest education level: Not on file  Occupational History   Occupation: retired    Comment: designer, fashion/clothing; dietician   Occupation: retired  Tobacco Use   Smoking status: Never   Smokeless tobacco: Never  Vaping Use    Vaping status: Never Used  Substance and Sexual Activity   Alcohol use: No   Drug use: No   Sexual activity: Not on file  Other Topics Concern   Not on file  Social History Narrative   Not on file   Social Drivers of Health   Tobacco Use: Low Risk (04/14/2024)   Patient History    Smoking Tobacco Use: Never    Smokeless Tobacco Use: Never    Passive Exposure: Not on file  Financial Resource Strain: Not on file  Food Insecurity: No Food Insecurity (04/14/2024)  Epic    Worried About Programme Researcher, Broadcasting/film/video in the Last Year: Never true    [employer] of Food in the Last Year: Never true  Transportation Needs: No Transportation Needs (04/14/2024)   Epic    Lack of Transportation (Medical): No    Lack of Transportation (Non-Medical): No  Physical Activity: Not on file  Stress: Not on file  Social Connections: Socially Integrated (04/14/2024)   Social Connection and Isolation Panel    Frequency of Communication with Friends and Family: Three times a week    Frequency of Social Gatherings with Friends and Family: Three times a week    Attends Religious Services: More than 4 times per year    Active Member of Clubs or Organizations: Yes    Attends Banker Meetings: 1 to 4 times per year    Marital Status: Married  Catering Manager Violence: Not At Risk (04/14/2024)   Epic    Fear of Current or Ex-Partner: No    Emotionally Abused: No    Physically Abused: No    Sexually Abused: No  Depression (PHQ2-9): Not on file  Alcohol Screen: Not on file  Housing: Low Risk (04/14/2024)   Epic    Unable to Pay for Housing in the Last Year: No    Number of Times Moved in the Last Year: 0    Homeless in the Last Year: No  Utilities: Not At Risk (04/14/2024)   Epic    Threatened with loss of utilities: No  Health Literacy: Not on file    Code Status   Code Status: Full Code   Vina Dasen, NP-C   04/15/2024, 1:34 PM  ---------------------------------------------------------  I have  taken a history, reviewed the chart and examined the patient. I performed a substantive portion of this encounter, including complete performance of at least one of the key components, in conjunction with the APP. I agree with the APP's note, impression and recommendations  84 year old female with history of coronary artery disease/history of MI, recent stroke (April 2025), diabetes and over 1 year history of chronic diarrhea with fecal incontinence, with multiple admissions in the past month with symptoms of flank/back pain, nausea, vomiting and diarrhea.  Previous infectious for chronic diarrhea was notable only for Giardia.  C. difficile was negative this time. When she was admitted a few weeks ago with worsening diarrhea, her C. difficile toxin and antigen were positive and she was treated with fidaxomicin . Per her husband, she was not able to keep any of the fidaxomicin  down after discharge. He reports that her diarrhea has not improved at all, she continues to have multiple loose and dark bowel movements, frequently with incontinence.  She continues to have recurrent nausea and vomiting and p.o. intolerance which has been ongoing for several weeks now.  CT scan showed thickening of the distal esophagus suggestive of esophagitis.  A previous CT scan earlier in the month demonstrated presence of food contents in the esophagus.  Her CT also demonstrated severe atherosclerosis of the SMA with poststenotic dilation.  She denies significant postprandial abdominal pain, sitophobia or weight loss.  Low suspicion that the patient's symptoms are due to chronic intestinal ischemia.  Although her CT scan also showed stool throughout the colon, her symptoms of multiple watery bowel movements in the setting of recent new [name] are more likely to be related to undertreated C. difficile due to p.o. intolerance, rather than an alternative etiologies such as overflow diarrhea.  Infectious  disease has evaluated the patient and has recommended adding Flagyl  in addition to continuing fidaxomicin .  I agree with his recommendation.  To evaluate her p.o. intolerance as well as the esophageal abnormalities on CT, we will perform an upper endoscopy tomorrow.  As patient is on Plavix , evaluation will be limited to diagnostic capabilities of visualization and small biopsies.  No dilation will be performed.    Soleia Badolato E. Stacia, MD [facility]  I spent a total of 65 minutes reviewing the patient's medical record, interviewing and examining the patient, discussing her diagnosis and management of her condition going forward, and documenting in the medical record   "

## 2024-04-15 NOTE — Plan of Care (Signed)

## 2024-04-15 NOTE — Progress Notes (Signed)
 The nurse was present during physician rounds with the patient and family this morning. The patients husband was visibly agitated and repeatedly interrupted the physician throughout the encounter. He expressed dissatisfaction with the patients prior hospitalization, stating that he felt his wife was discharged prematurely with her prior admission    The physician attempted multiple times to explain the patients clinical condition and requested permission from the husband to speak without interruption. The physician explained the diagnosis of C. difficile (C. diff) infection and reviewed how CDF is commonly managed within the geriatric population. The husband stated he understood but redirected the conversation to his own medical history, stating that he had been hospitalized twice for nine days for C. Diff and did not understand why his wife did not have a similar length of stay.    The physician explained that the patient had fewer than three bowel movements, which contributed to the decision for discharge. The husband became increasingly combative, stating that upon returning home the patient was unable to tolerate oral intake and experienced nausea, vomiting, and lack of bowel movements.    The physician again attempted to explain the treatment process for C. diff; however, the husband continued to interrupt and speak over the physician. The physician then stated that Infectious Disease would be consulted to further evaluate the patients condition. A vancomycin  enema was suggested, which the husband opposed.    When the physician attempted to question the patient directly, the husband intervened, stating the patient was unable to answer questions. The physician proceeded to address the patient directly. The patient was able to answer all questions asked.    The nurse asked the patient if she had a bowel movement at 0700 and whether she had one afterward. The patient stated yes. The nurse  confirmed with the nurse technician, who reported that the patient had a very small bowel movement after 0700, described as a Type 2 stool.

## 2024-04-15 NOTE — Consult Note (Signed)
 "        [facility]    Date of Admission:  04/14/2024     Reason for Consult: cdiff diarrhea    Referring Provider: Royal Blanco     Lines:  Periphearl iv's  Abx: 12/28-c fidaxomycin        Assessment: 84 yo female htn, hlp, cad, lumbago, recent pyelo s/p appropriate abx course now readmitted for diarrhea and n/v found to have cdiff colitis discharged on fidaxomycin course starting 12/28, returned on 04/14/24 for ongoing diarrhea and n/v and abd pain   She remains a very poor historian Question mark if diarrhea frequency is worse or better but appears the same > 6 times a day. And the n/v seems to be the same as well She does report intact appetite but vomited up what she eats  Of note, she did receive a few days of systemic abx for imaging suggestion of persistent pyelo but she had no sx of systemic sepsis or pyelo so stopped early  She has been followed by gi previously outpatient for IBS  Her ct repeat this admission showed no evidence imaging colitis; there is large stool burden in her bowel. There is reports of sma calcification  No fever, chill, leukocytosis. Her albumin  remains in mid 3's which is too good for someone severely ill due to inflammatory bowel process. She also seems to have had almost the entire fidaxomycin (7 days) on arrival here   I discussed case with GI who doesn't think the bowel stool burden has any contribution to her presentation; and lower suspicion for the sma ct finding to suggest sma syndrome   And it appears she might have had chronic diarrhea   Given these confounders, I will plan to escalate cdiff treatment by adding metronidazole  iv for the next 3-5 days. If no improvement along with fidaxomycin, I would suggest to look elsewhere besides cdiff. If there is some improvement we can try to finish another full 7 days course starting today and consider outpatient VOWST   Plan: Continue fidaxomycin Add flagyl   iv Check other new process such as viral gastroenteritis with gi pcr Maintain enteric contact isolation for cdiff Appreciate gi management Discussed with primary team      ------------------------------------------------ Principal Problem:   Intractable nausea and vomiting Active Problems:   Glaucoma, both eyes   GERD (gastroesophageal reflux disease)   Fibromyalgia   Depression, major, recurrent, in complete remission   AAA (abdominal aortic aneurysm)   Cataract   Essential hypertension   Diabetes mellitus due to underlying condition with unspecified complications (HCC)   Spinal stenosis of lumbar region with neurogenic claudication   Mixed hyperlipidemia   CAD (coronary artery disease)   Obesity (BMI 30.0-34.9)   History of CVA (cerebrovascular accident)   C. difficile colitis   Imaging of gastrointestinal tract abnormal    HPI: Cassandra Brooks is a 84 y.o. female htn, hlp, cad, lumbago, recent pyelo s/p appropriate abx course now readmitted for diarrhea and n/v found to have cdiff colitis discharged on fidaxomycin course starting 12/28, returned on 04/14/24 for ongoing diarrhea and n/v and abd pain   She remains a very poor historian Question mark if diarrhea frequency is worse or better but appears the same > 6 times a day. And the n/v seems to be the same as well She does report intact appetite but vomited up what she eats  Of note, she did receive a few days of systemic abx for imaging  suggestion of persistent pyelo but she had no sx of systemic sepsis or pyelo so stopped early  She has been followed by gi previously outpatient for IBS  Her ct repeat this admission showed no evidence imaging colitis; there is large stool burden in her bowel. There is reports of sma calcification  No fever, chill, leukocytosis. Her albumin  remains in mid 3's which is too good for someone severely ill due to inflammatory bowel process. She also seems to have had almost the entire  fidaxomycin (7 days) on arrival here   Her husband is rather upset  Patient remains nonchalant with the whole process and remains poor insight  Unable to quantify how much diarrhea but in the end reports mostly watery and every 2 hours    Family History  Problem Relation Age of Onset   Hypertension Father    Stomach cancer Father    Glaucoma Mother    Diabetes Mother    Diabetes Sister    Diabetes Brother    Lupus Child    Diabetes Brother    Lung cancer Brother    Colon cancer Brother     Social History[1]  Allergies[2]  Review of Systems: ROS All Other ROS was negative, except mentioned above   Past Medical History:  Diagnosis Date   AAA (abdominal aortic aneurysm)    ACS (acute coronary syndrome) (HCC) 08/01/2022   Arthritis    knees (03/21/2017)   Borderline diabetes    Bulging lumbar disc    CAD (coronary artery disease) 08/17/2022   Depression    Diabetes mellitus due to underlying condition with unspecified complications (HCC) 12/19/2019   Disequilibrium    Dizziness    Essential hypertension 12/19/2019   Fall    Fibromyalgia    GERD (gastroesophageal reflux disease)    Glaucoma, both eyes    Hemoptysis 03/18/2015   Hyperlipidemia    don't have it but I take RX (03/21/2017)   Hypertension    Mixed dyslipidemia 12/19/2019   Mixed hyperlipidemia 04/18/2017   Nuclear cataract of both eyes 01/01/2014   Obesity (BMI 30.0-34.9) 09/29/2022   Plantar fasciitis 10/21/2020   Posterior tibial tendon dysfunction (PTTD) of right lower extremity 11/16/2021   Pre-diabetes    pt states she is not diabetic. Last A1C was normal. Pt is on no medications for DM   Preoperative cardiovascular examination 12/19/2019   Primary osteoarthritis of left foot 10/21/2020   Primary osteoarthritis of left knee 07/18/2019   Solitary pulmonary nodule 03/18/2015   03/2015 6mm RLL nodule    Spinal stenosis of lumbar region with neurogenic claudication 04/26/2021   Spinal  stenosis, lumbar region with neurogenic claudication 04/26/2021   Central stenosis, L2-3 and L3-4 with moderate foramenal narrowing L4-5   Status post AAA (abdominal aortic aneurysm) repair 12/19/2019   Status post total left knee replacement 03/27/2020   STEMI (ST elevation myocardial infarction) (HCC) 08/01/2022   STEMI involving right coronary artery (HCC) 07/29/2022   Syncope 03/21/2017   Syncope and collapse    TIA (transient ischemic attack) 08/03/2023       Scheduled Meds:  acidophilus  1 capsule Oral Daily   amLODipine   10 mg Oral Daily   aspirin  EC  81 mg Oral QHS   atorvastatin   80 mg Oral Daily   clopidogrel   75 mg Oral Daily   diclofenac   1 patch Transdermal BID   diclofenac  Sodium  2 g Topical QID   dorzolamide -timolol   1 drop Both Eyes BID   enoxaparin  (  LOVENOX ) injection  40 mg Subcutaneous Q24H   famotidine   40 mg Oral Daily   fidaxomicin   200 mg Oral BID   irbesartan   150 mg Oral Daily   metoprolol  tartrate  50 mg Oral BID   ondansetron  (ZOFRAN ) IV  4 mg Intravenous Q6H   pantoprazole   40 mg Oral Daily   sodium chloride  flush  3 mL Intravenous Q12H   venlafaxine  XR  150 mg Oral Q breakfast   Continuous Infusions:  metronidazole  500 mg (04/15/24 1633)   PRN Meds:.acetaminophen  **OR** acetaminophen , alum & mag hydroxide-simeth   OBJECTIVE: Blood pressure (!) 155/59, pulse 77, temperature 98.3 F (36.8 C), resp. rate 19, SpO2 96%.  Physical Exam  General/constitutional: no distress, pleasant, conversant. Got up once to go to bathroom HEENT: Normocephalic, PER, Conj Clear, EOMI, Oropharynx clear Neck supple CV: rrr no mrg Lungs: clear to auscultation, normal respiratory effort Abd: Soft, Nontender Ext: no edema Skin: No Rash Neuro: nonfocal MSK: no peripheral joint swelling/tenderness/warmth; back spines nontender     Lab Results Lab Results  Component Value Date   WBC 9.5 04/15/2024   HGB 13.0 04/15/2024   HCT 39.0 04/15/2024   MCV 105.7  (H) 04/15/2024   PLT 165 04/15/2024    Lab Results  Component Value Date   CREATININE 0.99 04/15/2024   BUN 20 04/15/2024   NA 135 04/15/2024   K 4.5 04/15/2024   CL 102 04/15/2024   CO2 22 04/15/2024    Lab Results  Component Value Date   ALT 15 04/15/2024   AST 22 04/15/2024   ALKPHOS 79 04/15/2024   BILITOT 0.5 04/15/2024      Microbiology: Recent Results (from the past 240 hours)  Urine Culture     Status: Abnormal (Preliminary result)   Collection Time: 04/14/24  1:09 PM   Specimen: Urine, Random  Result Value Ref Range Status   Specimen Description URINE, RANDOM  Final   Special Requests NONE Reflexed from [id number]  Final   Culture (A)  Final    >=100,000 COLONIES/mL ESCHERICHIA COLI SUSCEPTIBILITIES TO FOLLOW Performed at [facility], [address] [place], [place] [address]    Report Status PENDING  Incomplete  Resp panel by RT-PCR (RSV, Flu A&B, Covid) Anterior Nasal Swab     Status: None   Collection Time: 04/14/24  3:29 PM   Specimen: Anterior Nasal Swab  Result Value Ref Range Status   SARS Coronavirus 2 by RT PCR NEGATIVE NEGATIVE Final   Influenza A by PCR NEGATIVE NEGATIVE Final   Influenza B by PCR NEGATIVE NEGATIVE Final    Comment: (NOTE) The Xpert Xpress SARS-CoV-2/FLU/RSV plus assay is intended as an aid in the diagnosis of influenza from Nasopharyngeal swab specimens and should not be used as a sole basis for treatment. Nasal washings and aspirates are unacceptable for Xpert Xpress SARS-CoV-2/FLU/RSV testing.  Fact Sheet for Patients: bloggercourse.com  Fact Sheet for Healthcare Providers: seriousbroker.it  This test is not yet approved or cleared by the [place]  FDA and has been authorized for detection and/or diagnosis of SARS-CoV-2 by FDA under an Emergency Use Authorization (EUA). This EUA will remain in effect (meaning this test can be used) for the duration of  the COVID-19 declaration under Section 564(b)(1) of the Act, 21 U.S.C. section 360bbb-3(b)(1), unless the authorization is terminated or revoked.     Resp Syncytial Virus by PCR NEGATIVE NEGATIVE Final    Comment: (NOTE) Fact Sheet for Patients: bloggercourse.com  Fact Sheet for  Healthcare Providers: seriousbroker.it  This test is not yet approved or cleared by the [place]  FDA and has been authorized for detection and/or diagnosis of SARS-CoV-2 by FDA under an Emergency Use Authorization (EUA). This EUA will remain in effect (meaning this test can be used) for the duration of the COVID-19 declaration under Section 564(b)(1) of the Act, 21 U.S.C. section 360bbb-3(b)(1), unless the authorization is terminated or revoked.  Performed at [facility], [address] [place], [place] [address]      Serology:    Imaging: If present, new imagings (plain films, ct scans, and mri) have been personally visualized and interpreted; radiology reports have been reviewed. Decision making incorporated into the Impression / Recommendations.  1/4 ct abd pelv with contrast 1. There is persistent hypoenhancement of the superomedial pole of the RIGHT kidney. This is favored to reflect sequela of pyelonephritis. Recommend correlation with urinalysis. 2. Small hiatal hernia with circumferential wall thickening of the distal esophagus. This could reflect esophagitis. 3. Severe atherosclerotic calcifications at the origin of the SMA with a small area of poststenotic dilation. 4. Mild subpleural reticulation with some possible interlobular septal thickening. This could reflect mild pulmonary edema superimposed on a background of mild interstitial lung disease.  Constance ONEIDA Passer, MD [facility] [phone] pager    04/15/2024, 8:35 PM     [1]  Social History Tobacco Use   Smoking  status: Never   Smokeless tobacco: Never  Vaping Use   Vaping status: Never Used  Substance Use Topics   Alcohol use: No   Drug use: No  [2]  Allergies Allergen Reactions   Zestril  [Lisinopril ] Cough   "

## 2024-04-16 ENCOUNTER — Encounter (HOSPITAL_COMMUNITY): Payer: Self-pay | Admitting: Internal Medicine

## 2024-04-16 ENCOUNTER — Inpatient Hospital Stay (HOSPITAL_COMMUNITY)

## 2024-04-16 ENCOUNTER — Encounter (HOSPITAL_COMMUNITY)
Admission: EM | Disposition: A | Discharge status: Home / Self Care | Payer: Self-pay | Source: Home / Self Care | Attending: Family Medicine

## 2024-04-16 DIAGNOSIS — I10 Essential (primary) hypertension: Secondary | ICD-10-CM | POA: Diagnosis not present

## 2024-04-16 DIAGNOSIS — K449 Diaphragmatic hernia without obstruction or gangrene: Secondary | ICD-10-CM

## 2024-04-16 DIAGNOSIS — K3189 Other diseases of stomach and duodenum: Secondary | ICD-10-CM

## 2024-04-16 DIAGNOSIS — E782 Mixed hyperlipidemia: Secondary | ICD-10-CM

## 2024-04-16 DIAGNOSIS — A0472 Enterocolitis due to Clostridium difficile, not specified as recurrent: Secondary | ICD-10-CM | POA: Diagnosis not present

## 2024-04-16 DIAGNOSIS — K559 Vascular disorder of intestine, unspecified: Secondary | ICD-10-CM | POA: Diagnosis not present

## 2024-04-16 DIAGNOSIS — K2100 Gastro-esophageal reflux disease with esophagitis, without bleeding: Secondary | ICD-10-CM | POA: Diagnosis not present

## 2024-04-16 DIAGNOSIS — K551 Chronic vascular disorders of intestine: Principal | ICD-10-CM

## 2024-04-16 DIAGNOSIS — A09 Infectious gastroenteritis and colitis, unspecified: Secondary | ICD-10-CM | POA: Diagnosis not present

## 2024-04-16 DIAGNOSIS — K529 Noninfective gastroenteritis and colitis, unspecified: Secondary | ICD-10-CM | POA: Diagnosis not present

## 2024-04-16 DIAGNOSIS — R112 Nausea with vomiting, unspecified: Secondary | ICD-10-CM | POA: Diagnosis not present

## 2024-04-16 DIAGNOSIS — I2510 Atherosclerotic heart disease of native coronary artery without angina pectoris: Secondary | ICD-10-CM | POA: Diagnosis not present

## 2024-04-16 HISTORY — PX: ESOPHAGOGASTRODUODENOSCOPY: SHX5428

## 2024-04-16 LAB — GASTROINTESTINAL PANEL BY PCR, STOOL (REPLACES STOOL CULTURE)

## 2024-04-16 LAB — URINE CULTURE: Culture: >=100000 — AB

## 2024-04-16 MED ORDER — SODIUM CHLORIDE 0.9 % IV SOLN
INTRAVENOUS | Status: AC | PRN
  Administered 2024-04-16: 500 mL via INTRAVENOUS

## 2024-04-16 MED ORDER — PROPOFOL 10 MG/ML IV BOLUS
INTRAVENOUS | Status: DC | PRN
  Administered 2024-04-16 (×3): 30 mg via INTRAVENOUS
  Administered 2024-04-16: 60 mg via INTRAVENOUS
  Administered 2024-04-16: 20 mg via INTRAVENOUS
  Administered 2024-04-16: 30 mg via INTRAVENOUS

## 2024-04-16 MED ORDER — PANTOPRAZOLE SODIUM 40 MG IV SOLR
40.0000 mg | Freq: Two times a day (BID) | INTRAVENOUS | Status: DC
  Administered 2024-04-16 – 2024-04-18 (×5): 40 mg via INTRAVENOUS
  Filled 2024-04-16 (×5): qty 10

## 2024-04-16 MED ORDER — SODIUM CHLORIDE 0.9 % IV SOLN: INTRAVENOUS | Status: DC

## 2024-04-16 NOTE — Progress Notes (Signed)
 [facility]  Cassandra Brooks FMW:[id number]  DOB: Aug 13, 1940  DOA: 04/14/2024  PCP: Erick Greig LABOR, NP  04/16/2024,5:24 PM  LOS: 1 day    Code Status: Full code     from: Home   84 year old female CAD cath 07/29/2022 total occlusion small posterior descending branch RCA previously aspirin  Brilinta -with complication of pain in the chest likely GI related Fibromyalgia Depression HTN HLD Known history of chronic fecal incontinence?  Overflow followed by Dr. Charlanne previously on FiberCon and Imodium    Admit 12/11-12/16 2025 progressive LBP nausea vomiting-CT ABD pelvis = right-sided pyelo-Rx Rocephin  2 g daily-diarrhea during hospital stay thought to be noninfectious patient discharged home on Amoxil -- 12/27 came in similar complaint nonbilious nonbloody diarrhea subjective fever chills CT abdomen pelvis residual right-sided pyelo blood culture X2 ordered 12/26, urine culture pending E. coli noted C. difficile also positive   Had sinus tach prompting echocardiogram-echo 12/29 EF 60-65% grade 1 DD ID consulted for evaluation of infectious symptoms-patient was discharged with recommendations for full course of fidamoxixin after discussion with ID scripts were given I told family to discontinue Imodium  and Protonix   1/1 returns with vomiting was taking Zofran  and Phenergan  without benefit apparently at home and no relief BUN/creatinine 27/1.0 CT abdomen pelvis persistent right kidney changes prior pyelonephritis small inguinal hernia distal esophageal thickening?  Esophagitis severe stenosis-edema/ILD Patient received Reglan  Zofran  and 1 L of fluids in the ED 1/5 GI ID consulted 1/6 EGD performed = grade C reflux esophagitis no bleeding-pale discolored mucosa gastric body suggestive of ischemia biopsied   Assessment  & Plan :    Intractable nausea vomiting poss Underlying ischemic colitis Patient could not tolerate any p.o. and CT shows esophagitis GI consulted and recommended EGD 1/6--concern for  ischemia PPI 40 IV twice daily Protonix  recommended with 8 weeks of twice a day oral and once daily indefinite-continuing Zofran  4 mg IV every 6, Pepcid  40 daily, Maalox 30 mL every 6 as needed Relafen  750 twice daily from home has been held as this may be exacerbating gastritis like issues in the setting of aspirin  Plavix  At GI request consulted vascular who will be doing a CT angiogram for underlying intermittent nausea and abdominal pain issues based on history that they obtained They are recommending a soft diet going forward-greatly appreciate input  C. difficile colitis ID consulted and recommend to complete a total of 7 days fidamoxixin as well as they added IV Flagyl  on 1/6-EOT for everything has been placed in the chart by infectious disease pharmacist She is had regular stool today  Urinary stream colonization with pyelo on one of the CTs Treated comprehensively with Rocephin  and then a 10-day course of Amoxil  last admissions Would defer to ID I do not think we need to treat further Continue acidophilus 1 cap daily can  CAD cardiac cath 2024 RCA stent Continues on amlodipine  10 atorvastatin  80 a VaPro 150 metoprolol  50 twice daily Continue aspirin  81, Plavix  75 No chest pain currently  ?  Esophagitis with nausea vomiting ?  IBS-underlying fibromyalgia Known history of fecal incontinence previously on Imodium /FiberCon and followed with Dr. Charlanne in the past have started meds as above discussion  Low back pain spinal stenosis Continue Flector  patch--- apparently not taking baclofen  at home for spasm the 10 mg not sure if we should resume and I have stopped it  Sinus tachycardia likely SVT previously Continue metoprolol  50 twice daily and keep on monitors today   Data Reviewed today: No labs today   DVT prophylaxis:  Lovenox    Dispo/Global plan: Unclear waiting on several further studies today and tomorrow I called and left a message for her husband on the phone     Subjective:   Seen at the bedside this afternoon after several procedures eating fish potatoes and having soup Cannot tell me definitively how many stools she had seems to understand but has poor recall No chest pain no fever  Objective + exam Vitals:   04/16/24 0840 04/16/24 0850 04/16/24 0854 04/16/24 1658  BP: (!) 147/62 (!) 151/79  (!) 150/56  Pulse: 72 68 66 72  Resp: (!) 23 17 19 18   Temp:    98.3 F (36.8 C)  TempSrc:      SpO2: 95% 100% 100% 91%   There were no vitals filed for this visit.   Examination: Awake alert no distress sitting up in chair--S1-S2 no murmur Abdomen slight distention no rebound no guarding No lower extremity edema ROM intact Power 5/5  Scheduled Meds:  acidophilus  1 capsule Oral Daily   amLODipine   10 mg Oral Daily   aspirin  EC  81 mg Oral QHS   atorvastatin   80 mg Oral Daily   clopidogrel   75 mg Oral Daily   diclofenac   1 patch Transdermal BID   diclofenac  Sodium  2 g Topical QID   dorzolamide -timolol   1 drop Both Eyes BID   enoxaparin  (LOVENOX ) injection  40 mg Subcutaneous Q24H   famotidine   40 mg Oral Daily   fidaxomicin   200 mg Oral BID   irbesartan   150 mg Oral Daily   metoprolol  tartrate  50 mg Oral BID   ondansetron  (ZOFRAN ) IV  4 mg Intravenous Q6H   pantoprazole  (PROTONIX ) IV  40 mg Intravenous Q12H   sodium chloride  flush  3 mL Intravenous Q12H   venlafaxine  XR  150 mg Oral Q breakfast   Continuous Infusions:  metronidazole  500 mg (04/16/24 1615)   acetaminophen  **OR** acetaminophen , alum & mag hydroxide-simeth  Time 60  Jai-Gurmukh Jaree Dwight, MD  [facility]

## 2024-04-16 NOTE — Consult Note (Addendum)
 " Hospital Consult    Reason for Consult:  possible mesenteric ischemia Requesting Physician:  Royal MRN #:  [id number]  History of Present Illness: This is a 84 y.o. female who presented to the hospital in December with multiple admissions with back pain and intermittent episodes of vomiting.  She was found to have pyelonephritis and was treated with antibiotics and developed diarrhea and admitted again and found to have E coli and C diff positive and was treated per ID.  She came back to the hospital 2 days ago as she was unable to tolerated her medications due to intractable N/V.  She did have a CT scan and was found to have severe SMA stenosis.   Vascular surgery is consulted.    She has PMH of DM, lumbar spinal stenosis, CAD, fibromyalgia, HTN, GERD, CVA, HLD.  She has normal renal function.  Her husband states that with her stroke, she couldn't talk and still has some trouble finding words that she wants to say.    She tells me about 60 years ago, she had an aneurysm in her stomach and was bleeding out into her stomach.  She states she was told they put some sort of clamp in place to fix the bleeding.  She subsequently had another exploratory lap years later and her husband states they didn't find anything and she just had gas.  She states that after her aneurysm surgery, she was pregnant and would develop dizziness.  She has hx of hysterectomy.  As far as her N/V, she states she gets nauseated just thinking about food.  She states that she gets nauseated before she eats.  She did state before this she gets some nausea after eating.  She states this has been going on for some time but cannot pinpoint how long.  She does not know if she has lost weight.    The pt is on a statin for cholesterol management.  The pt is on a daily aspirin .   Other AC:  Plavix . Lovenox  for DVT proph The pt is on CCB, ARB, BB for hypertension.   The pt is is not on medication for diabetes PTA. Tobacco hx:   never  Past Medical History:  Diagnosis Date   AAA (abdominal aortic aneurysm)    ACS (acute coronary syndrome) (HCC) 08/01/2022   Arthritis    knees (03/21/2017)   Borderline diabetes    Bulging lumbar disc    CAD (coronary artery disease) 08/17/2022   Depression    Diabetes mellitus due to underlying condition with unspecified complications (HCC) 12/19/2019   Disequilibrium    Dizziness    Essential hypertension 12/19/2019   Fall    Fibromyalgia    GERD (gastroesophageal reflux disease)    Glaucoma, both eyes    Hemoptysis 03/18/2015   Hyperlipidemia    don't have it but I take RX (03/21/2017)   Hypertension    Mixed dyslipidemia 12/19/2019   Mixed hyperlipidemia 04/18/2017   Nuclear cataract of both eyes 01/01/2014   Obesity (BMI 30.0-34.9) 09/29/2022   Plantar fasciitis 10/21/2020   Posterior tibial tendon dysfunction (PTTD) of right lower extremity 11/16/2021   Pre-diabetes    pt states she is not diabetic. Last A1C was normal. Pt is on no medications for DM   Preoperative cardiovascular examination 12/19/2019   Primary osteoarthritis of left foot 10/21/2020   Primary osteoarthritis of left knee 07/18/2019   Solitary pulmonary nodule 03/18/2015   03/2015 6mm RLL nodule    Spinal stenosis  of lumbar region with neurogenic claudication 04/26/2021   Spinal stenosis, lumbar region with neurogenic claudication 04/26/2021   Central stenosis, L2-3 and L3-4 with moderate foramenal narrowing L4-5   Status post AAA (abdominal aortic aneurysm) repair 12/19/2019   Status post total left knee replacement 03/27/2020   STEMI (ST elevation myocardial infarction) (HCC) 08/01/2022   STEMI involving right coronary artery (HCC) 07/29/2022   Syncope 03/21/2017   Syncope and collapse    TIA (transient ischemic attack) 08/03/2023    Past Surgical History:  Procedure Laterality Date   ABDOMINAL AORTIC ANEURYSM REPAIR  1966   APPENDECTOMY  1966   CARDIAC CATHETERIZATION     20+  years ago, no abnormalities found   COLONOSCOPY  11/02/2016   Dr Towana. A few diverticuli were seen in the sigmoid colon. Otherwise normal colonoscopy to cecum   CORONARY/GRAFT ACUTE MI REVASCULARIZATION N/A 07/29/2022   Procedure: Coronary/Graft Acute MI Revascularization;  Surgeon: Burnard Debby LABOR, MD;  Location: [facility] INVASIVE CV LAB;  Service: Cardiovascular;  Laterality: N/A;   LEFT HEART CATH AND CORONARY ANGIOGRAPHY N/A 07/29/2022   Procedure: LEFT HEART CATH AND CORONARY ANGIOGRAPHY;  Surgeon: Burnard Debby LABOR, MD;  Location: [facility] INVASIVE CV LAB;  Service: Cardiovascular;  Laterality: N/A;   LUMBAR LAMINECTOMY/DECOMPRESSION MICRODISCECTOMY N/A 04/26/2021   Procedure: CENTRAL LAMINECTOMIES LUMBAR TWO-THREE,  LUMBAR THREE-FOUR WITH BILATERAL FORAMINOTOMIES;  Surgeon: Lucilla Lynwood BRAVO, MD;  Location: [facility] OR;  Service: Orthopedics;  Laterality: N/A;   TOTAL KNEE ARTHROPLASTY Left 03/27/2020   Procedure: LEFT TOTAL KNEE ARTHROPLASTY;  Surgeon: Jerri Kay HERO, MD;  Location: [facility] OR;  Service: Orthopedics;  Laterality: Left;   VAGINAL HYSTERECTOMY     partial   VIDEO BRONCHOSCOPY Bilateral 03/19/2015   Procedure: VIDEO BRONCHOSCOPY WITHOUT FLUORO;  Surgeon: Harden Jude GAILS, MD;  Location: [facility] ENDOSCOPY;  Service: Cardiopulmonary;  Laterality: Bilateral;    Allergies[1]  Prior to Admission medications  Medication Sig Start Date End Date Taking? Authorizing Provider  acetaminophen  (TYLENOL ) 650 MG CR tablet Take 650 mg by mouth 2 (two) times daily.   Yes [provider]  amLODipine  (NORVASC ) 10 MG tablet Take 10 mg by mouth daily. 02/20/24  Yes [provider]  aspirin  EC 81 MG tablet Take 1 tablet (81 mg total) by mouth at bedtime. 08/05/23  Yes Dennise Lavada POUR, MD  atorvastatin  (LIPITOR ) 80 MG tablet Take 1 tablet (80 mg total) by mouth daily. 08/02/22  Yes Henry Manuelita NOVAK, NP  azelastine (ASTELIN) 0.1 % nasal spray Place 1-2 sprays into both nostrils 2 (two) times daily as  needed for rhinitis or allergies.   Yes [provider]  baclofen  (LIORESAL ) 10 MG tablet Take 1 tablet (10 mg total) by mouth at bedtime as needed for muscle spasms. 04/01/24  Yes Williams, Megan E, NP  clopidogrel  (PLAVIX ) 75 MG tablet Take 1 tablet (75 mg total) by mouth daily. 08/05/23  Yes Dennise Lavada POUR, MD  diclofenac  (FLECTOR ) 1.3 % PTCH Place 1 patch onto the skin 2 (two) times daily. 04/11/24  Yes Samtani, Jai-Gurmukh, MD  diclofenac  Sodium (VOLTAREN ) 1 % GEL Apply 2 g topically 4 (four) times daily. 04/11/24  Yes Samtani, Jai-Gurmukh, MD  dorzolamide -timolol  (COSOPT ) 22.3-6.8 MG/ML ophthalmic solution Place 1 drop into both eyes 2 (two) times daily.   Yes [provider]  famotidine  (PEPCID ) 40 MG tablet Take 1 tablet (40 mg total) by mouth daily. 04/12/24  Yes Samtani, Jai-Gurmukh, MD  fidaxomicin  (DIFICID ) 200 MG TABS tablet Take 1 tablet (200  mg total) by mouth 2 (two) times daily for 5 days. 04/11/24 04/16/24 Yes Samtani, Jai-Gurmukh, MD  fluticasone  (FLONASE ) 50 MCG/ACT nasal spray Place 2 sprays into both nostrils daily. 11/08/19  Yes [provider]  metoprolol  tartrate (LOPRESSOR ) 50 MG tablet Take 1 tablet (50 mg total) by mouth 2 (two) times daily. 04/11/24  Yes Samtani, Jai-Gurmukh, MD  Multiple Vitamins-Minerals (MULTIVITAMIN WOMEN 50+) TABS Take 1 tablet by mouth daily.   Yes [provider]  nabumetone  (RELAFEN ) 750 MG tablet Take 750 mg by mouth 2 (two) times daily. 02/21/17  Yes [provider]  nitroGLYCERIN  (NITROSTAT ) 0.4 MG SL tablet Place 0.4 mg under the tongue every 5 (five) minutes as needed for chest pain.   Yes [provider]  ondansetron  (ZOFRAN -ODT) 4 MG disintegrating tablet Take 1 tablet (4 mg total) by mouth every 4 (four) hours as needed. 03/26/24  Yes Fausto Sor A, DO  Probiotic Product (PROBIOTIC PO) Take 1 capsule by mouth daily.   Yes [provider]  ROCKLATAN  0.02-0.005 % SOLN Apply 1 drop to eye  at bedtime. 02/09/24  Yes [provider]  valsartan  (DIOVAN ) 160 MG tablet Take 1 tablet (160 mg total) by mouth at bedtime. 08/07/23  Yes Dennise Lavada POUR, MD  venlafaxine  XR (EFFEXOR -XR) 150 MG 24 hr capsule Take 1 capsule (150 mg total) by mouth daily with breakfast. 03/28/17  Yes Erle Hails, DO    Social History   Socioeconomic History   Marital status: Married    Spouse name: Not on file   Number of children: Not on file   Years of education: Not on file   Highest education level: Not on file  Occupational History   Occupation: retired    Comment: designer, fashion/clothing; dietician   Occupation: retired  Tobacco Use   Smoking status: Never   Smokeless tobacco: Never  Vaping Use   Vaping status: Never Used  Substance and Sexual Activity   Alcohol use: No   Drug use: No   Sexual activity: Not on file  Other Topics Concern   Not on file  Social History Narrative   Not on file   Social Drivers of Health   Tobacco Use: Low Risk (04/16/2024)   Patient History    Smoking Tobacco Use: Never    Smokeless Tobacco Use: Never    Passive Exposure: Not on file  Financial Resource Strain: Not on file  Food Insecurity: No Food Insecurity (04/14/2024)   Epic    Worried About Programme Researcher, Broadcasting/film/video in the Last Year: Never true    Ran Out of Food in the Last Year: Never true  Transportation Needs: No Transportation Needs (04/14/2024)   Epic    Lack of Transportation (Medical): No    Lack of Transportation (Non-Medical): No  Physical Activity: Not on file  Stress: Not on file  Social Connections: Socially Integrated (04/14/2024)   Social Connection and Isolation Panel    Frequency of Communication with Friends and Family: Three times a week    Frequency of Social Gatherings with Friends and Family: Three times a week    Attends Religious Services: More than 4 times per year    Active Member of Clubs or Organizations: Yes    Attends Banker Meetings: 1 to 4 times per year     Marital Status: Married  Catering Manager Violence: Not At Risk (04/14/2024)   Epic    Fear of Current or Ex-Partner: No    Emotionally Abused: No  Physically Abused: No    Sexually Abused: No  Depression (PHQ2-9): Not on file  Alcohol Screen: Not on file  Housing: Low Risk (04/14/2024)   Epic    Unable to Pay for Housing in the Last Year: No    Number of Times Moved in the Last Year: 0    Homeless in the Last Year: No  Utilities: Not At Risk (04/14/2024)   Epic    Threatened with loss of utilities: No  Health Literacy: Not on file    Family History  Problem Relation Age of Onset   Hypertension Father    Stomach cancer Father    Glaucoma Mother    Diabetes Mother    Diabetes Sister    Diabetes Brother    Lupus Child    Diabetes Brother    Lung cancer Brother    Colon cancer Brother     ROS: [x]  Positive   [ ]  Negative   [ ]  All sytems reviewed and are negative  Cardiac: [x]  CAD [x]  HLD [x]  HTN   Vascular: []  pain in legs while walking []  pain in legs at rest []  pain in legs at night []  non-healing ulcers []  hx of DVT []  swelling in legs  Pulmonary: []  asthma/wheezing []  home O2  Neurologic: [x]  hx of CVA []  mini stroke   Hematologic: []  hx of cancer  Endocrine:   [x]  diabetes []  thyroid  disease  GI [x]  GERD [x]  N/V/abdominal pain [x]  C diff  GU: []  CKD/renal failure []  HD--[]  M/W/F or []  T/T/S  Psychiatric: []  anxiety [x]  depression  Musculoskeletal: [x]  spinal stenosis   Integumentary: []  rashes []  ulcers  Constitutional: []  fever  []  chills  Physical Examination  Vitals:   04/16/24 0850 04/16/24 0854  BP: (!) 151/79   Pulse: 68 66  Resp: 17 19  Temp:    SpO2: 100% 100%    General:  WDWN in NAD Gait: Not observed HENT: WNL, normocephalic Pulmonary: normal non-labored breathing Cardiac: regular Abdomen:  soft, NT; aortic pulse is not palpable Skin: without rashes Vascular Exam/Pulses:  Right Left  Radial 2+ (normal)  2+ (normal)  DP 2+ (normal) 2+ (normal)    Musculoskeletal: no muscle wasting or atrophy  Neurologic: A&O X 3 Psychiatric:  The pt has Normal affect.   CBC    Component Value Date/Time   WBC 9.5 04/15/2024 0433   RBC 3.69 (L) 04/15/2024 0433   HGB 13.0 04/15/2024 0433   HCT 39.0 04/15/2024 0433   HCT 42.3 03/22/2017 0336   PLT 165 04/15/2024 0433   MCV 105.7 (H) 04/15/2024 0433   MCH 35.2 (H) 04/15/2024 0433   MCHC 33.3 04/15/2024 0433   RDW 13.9 04/15/2024 0433   LYMPHSABS 1.4 04/14/2024 0951   MONOABS 0.9 04/14/2024 0951   EOSABS 0.2 04/14/2024 0951   BASOSABS 0.0 04/14/2024 0951    BMET    Component Value Date/Time   NA 135 04/15/2024 0433   NA 141 11/23/2022 0854   K 4.5 04/15/2024 0433   CL 102 04/15/2024 0433   CO2 22 04/15/2024 0433   GLUCOSE 133 (H) 04/15/2024 0433   BUN 20 04/15/2024 0433   BUN 28 (H) 11/23/2022 0854   CREATININE 0.99 04/15/2024 0433   CALCIUM  8.9 04/15/2024 0433   GFRNONAA 56 (L) 04/15/2024 0433   GFRAA 54 (L) 08/23/2019 1747    COAGS: Lab Results  Component Value Date   INR 1.1 04/05/2024   INR 1.1 03/22/2024   INR 1.1 08/03/2023  Non-Invasive Vascular Imaging:   CT a/p with contrast 04/14/2024 IMPRESSION: 1. There is persistent hypoenhancement of the superomedial pole of the RIGHT kidney. This is favored to reflect sequela of pyelonephritis. Recommend correlation with urinalysis. 2. Small hiatal hernia with circumferential wall thickening of the distal esophagus. This could reflect esophagitis. 3. Severe atherosclerotic calcifications at the origin of the SMA with a small area of poststenotic dilation. 4. Mild subpleural reticulation with some possible interlobular septal thickening. This could reflect mild pulmonary edema superimposed on a background of mild interstitial lung disease.    ASSESSMENT/PLAN: This is a 84 y.o. female with N/V recently with C diff found to have SMA stenosis and vascular surgery is consulted.   She has hx of exploratory lap x 2 in the distant past, one for rupture aneurysm with repair many years ago.    -pt with palpable pulses bilateral radial/DP.  She was found to have an SMA stenosis.  Upon talking with pt, she does not describe classic symptoms of mesenteric ischemia.  -Dr. Pearline will review CT scan evaluate pt and determine further plan -continue asa/statin   Lucie Apt, PA-C [facility] [phone]   VASCULAR STAFF ADDENDUM: I have independently interviewed and examined the patient. I agree with the above.  The patient and husband agree that she does mostly suffer from nausea episodes.  These are intermittent and are not necessarily always related to food.  Sometimes related to getting up and moving to a new spot in the house.  But they also do describe some element of postprandial pain.  It is not with every meal but she does have abdominal pain after eating intermittently, she reports it being 8 out of 10.  I explained with her to offer her angiogram as a diagnostic procedure and potentially intervene on any significant SMA stenosis if stenting.  I explained that this could potentially help her postprandial pain but will unlikely help her intermittent nausea that is not necessarily related to food intake. We discussed the risks and benefits and specifically discussed the risk of groin complication.  Her and her husband wish to proceed. Will be scheduled with Dr. Lanis tomorrow in the Cath Lab.  Norman GORMAN Pearline MD [facility] Phone Number: [phone] 04/16/2024 2:31 PM     [1]  Allergies Allergen Reactions   Zestril  [Lisinopril ] Cough   "

## 2024-04-16 NOTE — Anesthesia Preprocedure Evaluation (Addendum)
"                                    Anesthesia Evaluation  Patient identified by MRN, date of birth, ID band Patient awake    Reviewed: Allergy & Precautions, NPO status , Patient's Chart, lab work & pertinent test results, reviewed documented beta blocker date and time   Airway Mallampati: II  TM Distance: >3 FB Neck ROM: Full    Dental  (+) Teeth Intact, Dental Advisory Given   Pulmonary neg pulmonary ROS   Pulmonary exam normal breath sounds clear to auscultation       Cardiovascular hypertension, Pt. on medications and Pt. on home beta blockers + CAD, + Past MI and + Peripheral Vascular Disease (s/p AAA repair)  Normal cardiovascular exam Rhythm:Regular Rate:Normal     Neuro/Psych  PSYCHIATRIC DISORDERS  Depression    TIA   GI/Hepatic Neg liver ROS,GERD  Medicated,,abdominal pain, nausea, vomiting, abnormal esophagus on CT scan   Endo/Other  diabetes, Type 2  Obesity   Renal/GU negative Renal ROS     Musculoskeletal  (+) Arthritis ,  Fibromyalgia -  Abdominal   Peds  Hematology  (+) Blood dyscrasia (Plavix )   Anesthesia Other Findings   Reproductive/Obstetrics                              Anesthesia Physical Anesthesia Plan  ASA: 3  Anesthesia Plan: MAC   Post-op Pain Management: Minimal or no pain anticipated   Induction: Intravenous  PONV Risk Score and Plan: 2 and TIVA and Treatment may vary due to age or medical condition  Airway Management Planned: Natural Airway and Simple Face Mask  Additional Equipment:   Intra-op Plan:   Post-operative Plan:   Informed Consent: I have reviewed the patients History and Physical, chart, labs and discussed the procedure including the risks, benefits and alternatives for the proposed anesthesia with the patient or authorized representative who has indicated his/her understanding and acceptance.     Dental advisory given  Plan Discussed with: CRNA  Anesthesia  Plan Comments:          Anesthesia Quick Evaluation  "

## 2024-04-16 NOTE — TOC CM/SW Note (Signed)
 Transition of Care [facility]) - Inpatient Brief Assessment   Patient Details  Name: Cassandra Brooks MRN: [id number] Date of Birth: 1940/06/14  Transition of Care [facility]) CM/SW Contact:    Tom-Johnson, Rice Walsh Daphne, RN Phone Number: 04/16/2024, 9:10 AM   Clinical Narrative:  Patient presented to the ED with worsening N/V/. Patient was discharged from the hospital 3 days prior to this admit with same symptoms and was treated for C-diff and Pyelonephritis.  ID and GI following, scheduled for upper Endoscopy today 04/16/24.  Patient has hx of Hypomagnesemia, Glaucoma, DM, HTN, HLD, CAD, Degenerative Disc Disease, Hypokalemia, Depression.    CM spoke with patient, husband Ozzie bedside about needs for post hospital transition. No changes from recent assessment.  PCP is Moon, Amy A, NP and uses [facility] in [place]. Active with [facility] for home health disciplines. Resumption of care order placed and [facility] voiced acceptance, info on AVS.    Patient not Medically ready for discharge.  CM will continue to follow as patient progresses with care towards discharge.            Transition of Care Asessment: Insurance and Status: Insurance coverage has been reviewed Patient has primary care physician: Yes Home environment has been reviewed: Yes Prior level of function:: Modified Independen Prior/Current Home Services: No current home services (Outpatient Therapy) Social Drivers of Health Review: SDOH reviewed no interventions necessary Readmission risk has been reviewed: Yes Transition of care needs: transition of care needs identified, TOC will continue to follow

## 2024-04-16 NOTE — Progress Notes (Signed)
 "        [facility]  Date of Admission:  04/14/2024     Lines:  Periphearl iv's   Abx: 12/28-c fidaxomycin                                                       Assessment: 84 yo female htn, hlp, cad, lumbago, recent pyelo s/p appropriate abx course now readmitted for diarrhea and n/v found to have cdiff colitis discharged on fidaxomycin course starting 12/28, returned on 04/14/24 for ongoing diarrhea and n/v and abd pain     She remains a very poor historian Question mark if diarrhea frequency is worse or better but appears the same > 6 times a day. And the n/v seems to be the same as well She does report intact appetite but vomited up what she eats   Of note, she did receive a few days of systemic abx for imaging suggestion of persistent pyelo but she had no sx of systemic sepsis or pyelo so stopped early   She has been followed by gi previously outpatient for IBS   Her ct repeat this admission showed no evidence imaging colitis; there is large stool burden in her bowel. There is reports of sma calcification   No fever, chill, leukocytosis. Her albumin  remains in mid 3's which is too good for someone severely ill due to inflammatory bowel process. She also seems to have had almost the entire fidaxomycin (7 days) on arrival here     I discussed case with GI who doesn't think the bowel stool burden has any contribution to her presentation; and lower suspicion for the sma ct finding to suggest sma syndrome     And it appears she might have had chronic diarrhea     Given these confounders, I will plan to escalate cdiff treatment by adding metronidazole  iv for the next 3-5 days. If no improvement along with fidaxomycin, I would suggest to look elsewhere besides cdiff. If there is some improvement we can try to finish another full 7 days course starting today and consider outpatient VOWST   --------------- 04/16/24 id assessment Egd showed gastric mucosal  pallor concerning for chronic mesentaric ischemia. Gi had asked for vascular surgery eval  Further hx suggest patient's sx chronic for several months all of n/v/abd pain and diarrhea  The last 24 hours 2 big bm and ?slightly formed  Again unclear if the diarrhea is different from how she have chronically but seems at home was 5-6 a day  Seems like mild cdiff at most and that she'll need further lower endoscopy/gi workup for chronic organic diarrhea  Insurance denies VOWST   Plan: -finish 7 days fidaxomycin/flagyl  on 04/20/24 -discussed with primary team -appreciate gi management -maintain cdiff enteric isolation precaution -id will sign off  Principal Problem:   Intractable nausea and vomiting Active Problems:   Glaucoma, both eyes   GERD (gastroesophageal reflux disease)   Fibromyalgia   Depression, major, recurrent, in complete remission   AAA (abdominal aortic aneurysm)   Cataract   Essential hypertension   Diabetes mellitus due to underlying condition with unspecified complications (HCC)   Spinal stenosis of lumbar region with neurogenic claudication   Mixed hyperlipidemia   CAD (coronary artery disease)   Obesity (BMI 30.0-34.9)   History of CVA (  cerebrovascular accident)   C. difficile colitis   Imaging of gastrointestinal tract abnormal   Infectious diarrhea   Allergies[1]  Scheduled Meds:  acidophilus  1 capsule Oral Daily   amLODipine   10 mg Oral Daily   aspirin  EC  81 mg Oral QHS   atorvastatin   80 mg Oral Daily   clopidogrel   75 mg Oral Daily   diclofenac   1 patch Transdermal BID   diclofenac  Sodium  2 g Topical QID   dorzolamide -timolol   1 drop Both Eyes BID   enoxaparin  (LOVENOX ) injection  40 mg Subcutaneous Q24H   famotidine   40 mg Oral Daily   fidaxomicin   200 mg Oral BID   irbesartan   150 mg Oral Daily   metoprolol  tartrate  50 mg Oral BID   ondansetron  (ZOFRAN ) IV  4 mg Intravenous Q6H   pantoprazole  (PROTONIX ) IV  40 mg Intravenous Q12H    sodium chloride  flush  3 mL Intravenous Q12H   venlafaxine  XR  150 mg Oral Q breakfast   Continuous Infusions:  metronidazole  500 mg (04/16/24 1615)   PRN Meds:.acetaminophen  **OR** acetaminophen , alum & mag hydroxide-simeth   SUBJECTIVE: 2 bm last 24 hours Nvabd pain controlled on meds Egd done suggestive chronic mesentaric ischemia  Review of Systems: ROS All other ROS was negative, except mentioned above     OBJECTIVE: Vitals:   04/16/24 0840 04/16/24 0850 04/16/24 0854 04/16/24 1658  BP: (!) 147/62 (!) 151/79  (!) 150/56  Pulse: 72 68 66 72  Resp: (!) 23 17 19 18   Temp:    98.3 F (36.8 C)  TempSrc:      SpO2: 95% 100% 100% 91%   There is no height or weight on file to calculate BMI.  Physical Exam General/constitutional: no distress, pleasant HEENT: Normocephalic, PER, Conj Clear, EOMI, Oropharynx clear Neck supple CV: rrr no mrg Lungs: clear to auscultation, normal respiratory effort Abd: Soft, Nontender Ext: no edema Skin: No Rash Neuro: nonfocal MSK: no peripheral joint swelling/tenderness/warmth; back spines nontender     Lab Results Lab Results  Component Value Date   WBC 9.5 04/15/2024   HGB 13.0 04/15/2024   HCT 39.0 04/15/2024   MCV 105.7 (H) 04/15/2024   PLT 165 04/15/2024    Lab Results  Component Value Date   CREATININE 0.99 04/15/2024   BUN 20 04/15/2024   NA 135 04/15/2024   K 4.5 04/15/2024   CL 102 04/15/2024   CO2 22 04/15/2024    Lab Results  Component Value Date   ALT 15 04/15/2024   AST 22 04/15/2024   ALKPHOS 79 04/15/2024   BILITOT 0.5 04/15/2024      Microbiology: Recent Results (from the past 240 hours)  Urine Culture     Status: Abnormal   Collection Time: 04/14/24  1:09 PM   Specimen: Urine, Random  Result Value Ref Range Status   Specimen Description URINE, RANDOM  Final   Special Requests   Final    NONE Reflexed from [id number] Performed at [facility], [address] [place], [place] [address]     Culture (A)  Final    >=100,000 COLONIES/mL ESCHERICHIA COLI Confirmed Extended Spectrum Beta-Lactamase Producer (ESBL).  In bloodstream infections from ESBL organisms, carbapenems are preferred over piperacillin/tazobactam. They are shown to have a lower risk of mortality.    Report Status 04/16/2024 FINAL  Final   Organism ID, Bacteria ESCHERICHIA COLI (A)  Final      Susceptibility   Escherichia coli -  MIC*    AMPICILLIN >=32 RESISTANT Resistant     CEFAZOLIN  (URINE) Value in next row Resistant      >=32 RESISTANTThis is a modified FDA-approved test that has been validated and its performance characteristics determined by the reporting laboratory.  This laboratory is certified under the Clinical Laboratory Improvement Amendments CLIA as qualified to perform high complexity clinical laboratory testing.    CEFEPIME Value in next row Resistant      >=32 RESISTANTThis is a modified FDA-approved test that has been validated and its performance characteristics determined by the reporting laboratory.  This laboratory is certified under the Clinical Laboratory Improvement Amendments CLIA as qualified to perform high complexity clinical laboratory testing.    ERTAPENEM Value in next row Sensitive      >=32 RESISTANTThis is a modified FDA-approved test that has been validated and its performance characteristics determined by the reporting laboratory.  This laboratory is certified under the Clinical Laboratory Improvement Amendments CLIA as qualified to perform high complexity clinical laboratory testing.    CEFTRIAXONE  Value in next row Resistant      >=32 RESISTANTThis is a modified FDA-approved test that has been validated and its performance characteristics determined by the reporting laboratory.  This laboratory is certified under the Clinical Laboratory Improvement Amendments CLIA as qualified to perform high complexity clinical laboratory testing.    CIPROFLOXACIN Value in next row Resistant       >=32 RESISTANTThis is a modified FDA-approved test that has been validated and its performance characteristics determined by the reporting laboratory.  This laboratory is certified under the Clinical Laboratory Improvement Amendments CLIA as qualified to perform high complexity clinical laboratory testing.    GENTAMICIN Value in next row Sensitive      >=32 RESISTANTThis is a modified FDA-approved test that has been validated and its performance characteristics determined by the reporting laboratory.  This laboratory is certified under the Clinical Laboratory Improvement Amendments CLIA as qualified to perform high complexity clinical laboratory testing.    NITROFURANTOIN Value in next row Sensitive      >=32 RESISTANTThis is a modified FDA-approved test that has been validated and its performance characteristics determined by the reporting laboratory.  This laboratory is certified under the Clinical Laboratory Improvement Amendments CLIA as qualified to perform high complexity clinical laboratory testing.    TRIMETH/SULFA Value in next row Sensitive      >=32 RESISTANTThis is a modified FDA-approved test that has been validated and its performance characteristics determined by the reporting laboratory.  This laboratory is certified under the Clinical Laboratory Improvement Amendments CLIA as qualified to perform high complexity clinical laboratory testing.    AMPICILLIN/SULBACTAM Value in next row Sensitive      >=32 RESISTANTThis is a modified FDA-approved test that has been validated and its performance characteristics determined by the reporting laboratory.  This laboratory is certified under the Clinical Laboratory Improvement Amendments CLIA as qualified to perform high complexity clinical laboratory testing.    PIP/TAZO Value in next row Sensitive      <=4 SENSITIVEThis is a modified FDA-approved test that has been validated and its performance characteristics determined by the reporting laboratory.   This laboratory is certified under the Clinical Laboratory Improvement Amendments CLIA as qualified to perform high complexity clinical laboratory testing.    MEROPENEM Value in next row Sensitive      <=4 SENSITIVEThis is a modified FDA-approved test that has been validated and its performance characteristics determined by the reporting laboratory.  This laboratory is  certified under the Clinical Laboratory Improvement Amendments CLIA as qualified to perform high complexity clinical laboratory testing.    * >=100,000 COLONIES/mL ESCHERICHIA COLI  Resp panel by RT-PCR (RSV, Flu A&B, Covid) Anterior Nasal Swab     Status: None   Collection Time: 04/14/24  3:29 PM   Specimen: Anterior Nasal Swab  Result Value Ref Range Status   SARS Coronavirus 2 by RT PCR NEGATIVE NEGATIVE Final   Influenza A by PCR NEGATIVE NEGATIVE Final   Influenza B by PCR NEGATIVE NEGATIVE Final    Comment: (NOTE) The Xpert Xpress SARS-CoV-2/FLU/RSV plus assay is intended as an aid in the diagnosis of influenza from Nasopharyngeal swab specimens and should not be used as a sole basis for treatment. Nasal washings and aspirates are unacceptable for Xpert Xpress SARS-CoV-2/FLU/RSV testing.  Fact Sheet for Patients: bloggercourse.com  Fact Sheet for Healthcare Providers: seriousbroker.it  This test is not yet approved or cleared by the [place]  FDA and has been authorized for detection and/or diagnosis of SARS-CoV-2 by FDA under an Emergency Use Authorization (EUA). This EUA will remain in effect (meaning this test can be used) for the duration of the COVID-19 declaration under Section 564(b)(1) of the Act, 21 U.S.C. section 360bbb-3(b)(1), unless the authorization is terminated or revoked.     Resp Syncytial Virus by PCR NEGATIVE NEGATIVE Final    Comment: (NOTE) Fact Sheet for Patients: bloggercourse.com  Fact Sheet for Healthcare  Providers: seriousbroker.it  This test is not yet approved or cleared by the [place]  FDA and has been authorized for detection and/or diagnosis of SARS-CoV-2 by FDA under an Emergency Use Authorization (EUA). This EUA will remain in effect (meaning this test can be used) for the duration of the COVID-19 declaration under Section 564(b)(1) of the Act, 21 U.S.C. section 360bbb-3(b)(1), unless the authorization is terminated or revoked.  Performed at [facility], [address] [place], [place] [address]   Gastrointestinal Panel by PCR , Stool     Status: None   Collection Time: 04/15/24  7:55 PM   Specimen: Stool  Result Value Ref Range Status   Campylobacter species NOT DETECTED NOT DETECTED Final   Plesimonas shigelloides NOT DETECTED NOT DETECTED Final   Salmonella species NOT DETECTED NOT DETECTED Final   Yersinia enterocolitica NOT DETECTED NOT DETECTED Final   Vibrio species NOT DETECTED NOT DETECTED Final   Vibrio cholerae NOT DETECTED NOT DETECTED Final   Enteroaggregative E coli (EAEC) NOT DETECTED NOT DETECTED Final   Enteropathogenic E coli (EPEC) NOT DETECTED NOT DETECTED Final   Enterotoxigenic E coli (ETEC) NOT DETECTED NOT DETECTED Final   Shiga like toxin producing E coli (STEC) NOT DETECTED NOT DETECTED Final   Shigella/Enteroinvasive E coli (EIEC) NOT DETECTED NOT DETECTED Final   Cryptosporidium NOT DETECTED NOT DETECTED Final   Cyclospora cayetanensis NOT DETECTED NOT DETECTED Final   Entamoeba histolytica NOT DETECTED NOT DETECTED Final   Giardia lamblia NOT DETECTED NOT DETECTED Final   Adenovirus F40/41 NOT DETECTED NOT DETECTED Final   Astrovirus NOT DETECTED NOT DETECTED Final   Norovirus GI/GII NOT DETECTED NOT DETECTED Final   Rotavirus A NOT DETECTED NOT DETECTED Final   Sapovirus (I, II, IV, and V) NOT DETECTED NOT DETECTED Final    Comment: Performed at [facility], [address] [place], [place]  [address]     Serology:   Imaging: If present, new imagings (plain films, ct scans, and mri) have been personally visualized and interpreted;  radiology reports have been reviewed. Decision making incorporated into the Impression / Recommendations.   Constance ONEIDA Passer, MD [facility] [phone] pager    04/16/2024, 5:22 PM     [1]  Allergies Allergen Reactions   Zestril  [Lisinopril ] Cough   "

## 2024-04-16 NOTE — Anesthesia Postprocedure Evaluation (Signed)
"   Anesthesia Post Note  Patient: Cassandra Brooks  Procedure(s) Performed: EGD (ESOPHAGOGASTRODUODENOSCOPY)     Patient location during evaluation: Endoscopy Anesthesia Type: MAC Level of consciousness: oriented, awake and alert and awake Pain management: pain level controlled Vital Signs Assessment: post-procedure vital signs reviewed and stable Respiratory status: spontaneous breathing, nonlabored ventilation and respiratory function stable Cardiovascular status: blood pressure returned to baseline and stable Postop Assessment: no headache, no backache and no apparent nausea or vomiting Anesthetic complications: no   There were no known notable events for this encounter.  Last Vitals:  Vitals:   04/16/24 0850 04/16/24 0854  BP: (!) 151/79   Pulse: 68 66  Resp: 17 19  Temp:    SpO2: 100% 100%    Last Pain:  Vitals:   04/16/24 0854  TempSrc:   PainSc: 0-No pain                 Garnette FORBES Skillern      "

## 2024-04-16 NOTE — Transfer of Care (Signed)
 Immediate Anesthesia Transfer of Care Note  Patient: Cassandra Brooks  Procedure(s) Performed: EGD (ESOPHAGOGASTRODUODENOSCOPY)  Patient Location: PACU  Anesthesia Type:MAC  Level of Consciousness: awake, alert , and oriented  Airway & Oxygen Therapy: Patient Spontanous Breathing and Patient connected to face mask oxygen  Post-op Assessment: Report given to RN and Post -op Vital signs reviewed and stable  Post vital signs: Reviewed and stable  Last Vitals:  Vitals Value Taken Time  BP 163/75 04/16/24 08:30  Temp 36.2 C 04/16/24 08:30  Pulse 69 04/16/24 08:30  Resp 21 04/16/24 08:30  SpO2 95 % 04/16/24 08:30  Vitals shown include unfiled device data.  Last Pain:  Vitals:   04/16/24 0830  TempSrc: Temporal  PainSc: 0-No pain      Patients Stated Pain Goal: 0 (04/15/24 1549)  Complications: There were no known notable events for this encounter.

## 2024-04-16 NOTE — Interval H&P Note (Signed)
 History and Physical Interval Note:  04/16/2024 7:49 AM  Cassandra Brooks  has presented today for surgery, with the diagnosis of abdominal pain, nausea, vomiting, abnormal esophagus on cT scan.  The various methods of treatment have been discussed with the patient and family. After consideration of risks, benefits and other options for treatment, the patient has consented to  Procedures: EGD (ESOPHAGOGASTRODUODENOSCOPY) (N/A) as a surgical intervention.  The patient's history has been reviewed, patient examined, no change in status, stable for surgery.  I have reviewed the patient's chart and labs.  Questions were answered to the patient's satisfaction.     [name]

## 2024-04-16 NOTE — Telephone Encounter (Signed)
 Pharmacy Patient Advocate Encounter  Received notification from [employer] that Prior Authorization for Vowst capsules has been DENIED.  Full denial letter will be uploaded to the media tab. See denial reason below.   PA #/Case ID/Reference #: [id number]

## 2024-04-16 NOTE — Op Note (Signed)
 Cassandra Brooks Patient Name: Cassandra Brooks Procedure Date : 04/16/2024 MRN: [id number] Attending MD: Glendia BRAVO. Stacia , MD, [id number] Date of Birth: 1940-06-30 CSN: [id number] Age: 84 Admit Type: Inpatient Procedure:                Upper GI endoscopy Indications:              Abnormal CT of the GI tract, Nausea with vomiting Providers:                Glendia E. Stacia, MD, Darleene Bare, RN, Haskel Chris, Technician Referring MD:              Medicines:                Monitored Anesthesia Care Complications:            No immediate complications. Estimated Blood Loss:     Estimated blood loss was minimal. Procedure:                Pre-Anesthesia Assessment:                           - Prior to the procedure, a History and Physical                            was performed, and patient medications and                            allergies were reviewed. The patient's tolerance of                            previous anesthesia was also reviewed. The risks                            and benefits of the procedure and the sedation                            options and risks were discussed with the patient.                            All questions were answered, and informed consent                            was obtained. Prior Anticoagulants: The patient has                            taken Plavix  (clopidogrel ), last dose was 2 days                            prior to procedure. ASA Grade Assessment: III - A                            patient with severe systemic disease. After  reviewing the risks and benefits, the patient was                            deemed in satisfactory condition to undergo the                            procedure.                           After obtaining informed consent, the endoscope was                            passed under direct vision. Throughout the                             procedure, the patient's blood pressure, pulse, and                            oxygen saturations were monitored continuously. The                            [id number]-[id number] ([id number]) Olympus endoscope was introduced                            through the mouth, and advanced to the second part                            of duodenum. The upper GI endoscopy was                            accomplished without difficulty. The patient                            tolerated the procedure well. Scope In: Scope Out: Findings:      The examined portions of the nasopharynx, oropharynx and larynx were       normal.      LA Grade C (one or more mucosal breaks continuous between tops of 2 or       more mucosal folds, less than 75% circumference) esophagitis with no       bleeding was found 25 to 35 cm from the incisors. Biopsies were taken       with a cold forceps for histology. Estimated blood loss was minimal.      The exam of the esophagus was otherwise normal.      A 4 cm hiatal hernia was present.      Scattered moderate mucosal changes characterized by pale discoloration       were found in the gastric body and in the gastric antrum. Biopsies were       taken with a cold forceps for Helicobacter pylori testing and to assess       for ischemia. Estimated blood loss was minimal, and in some areas, there       was no bleeding following biopsy, highly suggestive of ischemia.      The exam of the stomach was otherwise normal.      The examined duodenum was normal. Impression:               -  The examined portions of the nasopharynx,                            oropharynx and larynx were normal.                           - LA Grade C reflux esophagitis with no bleeding.                            Biopsied.                           - 4 cm hiatal hernia.                           - Pale, discolored mucosa in the gastric body and                            antrum suggestive of ischemia. Biopsied.                            - Normal examined duodenum.                           - Based on the endoscopic findings of poor gastric                            perfusion and the CT findings, the patient's PO                            intolerance is most likely related to intestinal                            angina/chronic intestinal ischemia. Moderate Sedation:      N/A Recommendation:           - Return patient to Brooks ward for ongoing care.                           - Resume previous diet.                           - Use Protonix  (pantoprazole ) 40 mg IV twice daily                            while hospitalized, with 8 weeks of twice daily PPI                            total to heal esophagitis, followed by once daily                            PPI indefinitely.                           - Recommend vascular surgery consultation to  consider stenting. Procedure Code(s):        --- Professional ---                           [phone], Esophagogastroduodenoscopy, flexible,                            transoral; with biopsy, single or multiple Diagnosis Code(s):        --- Professional ---                           K21.00, Gastro-esophageal reflux disease with                            esophagitis, without bleeding                           K44.9, Diaphragmatic hernia without obstruction or                            gangrene                           K31.89, Other diseases of stomach and duodenum                           R11.2, Nausea with vomiting, unspecified                           R93.3, Abnormal findings on diagnostic imaging of                            other parts of digestive tract CPT copyright 2022 American [employer]. All rights reserved. The codes documented in this report are preliminary and upon coder review may  be revised to meet current compliance requirements. [name], MD 04/16/2024 8:38:44 AM This report has been signed  electronically. Number of Addenda: 0

## 2024-04-17 ENCOUNTER — Inpatient Hospital Stay (HOSPITAL_COMMUNITY)
Admission: EM | Disposition: A | Discharge status: Home / Self Care | Payer: Self-pay | Source: Home / Self Care | Attending: Family Medicine

## 2024-04-17 DIAGNOSIS — A0472 Enterocolitis due to Clostridium difficile, not specified as recurrent: Secondary | ICD-10-CM | POA: Diagnosis not present

## 2024-04-17 DIAGNOSIS — K529 Noninfective gastroenteritis and colitis, unspecified: Secondary | ICD-10-CM | POA: Diagnosis not present

## 2024-04-17 DIAGNOSIS — R112 Nausea with vomiting, unspecified: Secondary | ICD-10-CM | POA: Diagnosis not present

## 2024-04-17 HISTORY — PX: VISCERAL ANGIOGRAPHY: CATH118276

## 2024-04-17 LAB — CBC WITH DIFFERENTIAL/PLATELET
Abs Immature Granulocytes: 0.02 K/uL (ref 0.00–0.07)
Basophils Absolute: 0 K/uL (ref 0.0–0.1)
Basophils Relative: 1 %
Eosinophils Absolute: 0.2 K/uL (ref 0.0–0.5)
Eosinophils Relative: 3 %
HCT: 32.5 % — ABNORMAL LOW (ref 36.0–46.0)
Hemoglobin: 10.9 g/dL — ABNORMAL LOW (ref 12.0–15.0)
Immature Granulocytes: 0 %
Lymphocytes Relative: 32 %
Lymphs Abs: 2.1 K/uL (ref 0.7–4.0)
MCH: 35.2 pg — ABNORMAL HIGH (ref 26.0–34.0)
MCHC: 33.5 g/dL (ref 30.0–36.0)
MCV: 104.8 fL — ABNORMAL HIGH (ref 80.0–100.0)
Monocytes Absolute: 0.8 K/uL (ref 0.1–1.0)
Monocytes Relative: 11 %
Neutro Abs: 3.6 K/uL (ref 1.7–7.7)
Neutrophils Relative %: 53 %
Platelets: 166 K/uL (ref 150–400)
RBC: 3.1 MIL/uL — ABNORMAL LOW (ref 3.87–5.11)
RDW: 13.9 % (ref 11.5–15.5)
WBC: 6.8 K/uL (ref 4.0–10.5)
nRBC: 0 % (ref 0.0–0.2)

## 2024-04-17 LAB — BASIC METABOLIC PANEL WITH GFR
Anion gap: 11 (ref 5–15)
BUN: 23 mg/dL (ref 8–23)
CO2: 21 mmol/L — ABNORMAL LOW (ref 22–32)
Calcium: 8.4 mg/dL — ABNORMAL LOW (ref 8.9–10.3)
Chloride: 105 mmol/L (ref 98–111)
Creatinine, Ser: 1.14 mg/dL — ABNORMAL HIGH (ref 0.44–1.00)
GFR, Estimated: 48 mL/min — ABNORMAL LOW
Glucose, Bld: 128 mg/dL — ABNORMAL HIGH (ref 70–99)
Potassium: 3.5 mmol/L (ref 3.5–5.1)
Sodium: 138 mmol/L (ref 135–145)

## 2024-04-17 MED ORDER — ASPIRIN 81 MG PO CHEW
CHEWABLE_TABLET | ORAL | Status: DC | PRN
  Administered 2024-04-17: 81 mg via ORAL

## 2024-04-17 MED ORDER — HYDRALAZINE HCL 20 MG/ML IJ SOLN: 5.0000 mg | INTRAMUSCULAR | Status: DC | PRN

## 2024-04-17 MED ORDER — PROTAMINE SULFATE 10 MG/ML IV SOLN
INTRAVENOUS | Status: DC | PRN
  Administered 2024-04-17: 5 mg via INTRAVENOUS
  Administered 2024-04-17: 25 mg via INTRAVENOUS

## 2024-04-17 MED ORDER — HEPARIN SODIUM (PORCINE) 5000 UNIT/ML IJ SOLN
5000.0000 [IU] | Freq: Three times a day (TID) | INTRAMUSCULAR | Status: DC
  Administered 2024-04-17 – 2024-04-18 (×2): 5000 [IU] via SUBCUTANEOUS
  Filled 2024-04-17 (×2): qty 1

## 2024-04-17 MED ORDER — FENTANYL CITRATE (PF) 100 MCG/2ML IJ SOLN
INTRAMUSCULAR | Status: DC | PRN
  Administered 2024-04-17: 50 ug via INTRAVENOUS

## 2024-04-17 MED ORDER — LABETALOL HCL 5 MG/ML IV SOLN: 10.0000 mg | INTRAVENOUS | Status: DC | PRN

## 2024-04-17 MED ORDER — SODIUM CHLORIDE 0.9 % IV SOLN: INTRAVENOUS | Status: DC

## 2024-04-17 MED ORDER — SODIUM CHLORIDE 0.9 % WEIGHT BASED INFUSION
1.0000 mL/kg/h | INTRAVENOUS | Status: AC
  Administered 2024-04-17: 1 mL/kg/h via INTRAVENOUS

## 2024-04-17 MED ORDER — LIDOCAINE HCL (PF) 1 % IJ SOLN
INTRAMUSCULAR | Status: DC | PRN
  Administered 2024-04-17: 15 mL

## 2024-04-17 MED ORDER — ONDANSETRON HCL 4 MG/2ML IJ SOLN: 4.0000 mg | Freq: Four times a day (QID) | INTRAMUSCULAR | Status: DC | PRN

## 2024-04-17 MED ORDER — MIDAZOLAM HCL (PF) 2 MG/2ML IJ SOLN
INTRAMUSCULAR | Status: DC | PRN
  Administered 2024-04-17: 1 mg via INTRAVENOUS

## 2024-04-17 MED ORDER — IODIXANOL 320 MG/ML IV SOLN
INTRAVENOUS | Status: DC | PRN
  Administered 2024-04-17: 70 mL

## 2024-04-17 MED ORDER — SODIUM CHLORIDE 0.9% FLUSH
3.0000 mL | Freq: Two times a day (BID) | INTRAVENOUS | Status: DC
  Administered 2024-04-18 (×2): 3 mL via INTRAVENOUS

## 2024-04-17 MED ORDER — LIDOCAINE HCL (PF) 1 % IJ SOLN
INTRAMUSCULAR | Status: AC
  Filled 2024-04-17: qty 30

## 2024-04-17 MED ORDER — PROTAMINE SULFATE 10 MG/ML IV SOLN
INTRAVENOUS | Status: AC
  Filled 2024-04-17: qty 5

## 2024-04-17 MED ORDER — HEPARIN SODIUM (PORCINE) 1000 UNIT/ML IJ SOLN
INTRAMUSCULAR | Status: AC
  Filled 2024-04-17: qty 10

## 2024-04-17 MED ORDER — FENTANYL CITRATE (PF) 100 MCG/2ML IJ SOLN
INTRAMUSCULAR | Status: AC
  Filled 2024-04-17: qty 2

## 2024-04-17 MED ORDER — MIDAZOLAM HCL 2 MG/2ML IJ SOLN
INTRAMUSCULAR | Status: AC
  Filled 2024-04-17: qty 2

## 2024-04-17 MED ORDER — SODIUM CHLORIDE 0.9 % IV SOLN: 250.0000 mL | INTRAVENOUS | Status: DC | PRN

## 2024-04-17 MED ORDER — ACETAMINOPHEN 325 MG PO TABS: 650.0000 mg | ORAL_TABLET | ORAL | Status: DC | PRN

## 2024-04-17 MED ORDER — SODIUM CHLORIDE 0.9% FLUSH: 3.0000 mL | INTRAVENOUS | Status: DC | PRN

## 2024-04-17 MED ORDER — HEPARIN (PORCINE) IN NACL 2000-0.9 UNIT/L-% IV SOLN
INTRAVENOUS | Status: DC | PRN
  Administered 2024-04-17: 1000 mL

## 2024-04-17 MED ORDER — LIDOCAINE-EPINEPHRINE 1 %-1:100000 IJ SOLN
INTRAMUSCULAR | Status: AC
  Filled 2024-04-17: qty 1

## 2024-04-17 MED ORDER — HEPARIN SODIUM (PORCINE) 1000 UNIT/ML IJ SOLN
INTRAMUSCULAR | Status: DC | PRN
  Administered 2024-04-17: 8000 [IU] via INTRAVENOUS

## 2024-04-17 MED ORDER — ASPIRIN 81 MG PO CHEW
CHEWABLE_TABLET | ORAL | Status: AC
  Filled 2024-04-17: qty 1

## 2024-04-17 NOTE — Op Note (Signed)
" ° ° °  Patient name: Christiona Siddique MRN: [id number] DOB: [birth year] Sex: female  04/17/2024 Pre-operative Diagnosis: Chronic mesenteric ischemia Post-operative diagnosis:  Same Surgeon:  Fonda FORBES Rim, MD Procedure Performed: 1.  Ultrasound-guided micropuncture access of the right common femoral artery retrograde fashion 2.  Aortogram 3.  Selective angiography of the superior mesenteric artery 4.  Mesenteric artery stenting 6 x 19 mm VBX 5.  Moderate sedation time 32 minutes, contrast volume 70 mL 6.  Access managed with Pro-glide device   Indications: Patient is an 84 year old female presenting with persistent nausea and vomiting..  Also some element of postprandial pain.  Abdominal pain is 8 out of 10.  Abdominal pain has been persistent, and imaging demonstrates critical SMA stenosis.  After discussing risks and benefits of angiogram with possible intervention, and he elected to proceed  Findings:  Celiac artery, small, nondiseased Superior mesenteric artery with 95% stenosis at its ostia.  Replaced right hepatic artery appreciated.  Inferior mesenteric artery widely patent with arc of riolan arcade appreciated. Bilateral renal arteries patent Patent infrarenal aorta, patent bilateral common iliac arteries    Procedure:  The patient was identified in the holding area and taken to room 8.  The patient was then placed supine on the table and prepped and draped in the usual sterile fashion.  A time out was called.  Ultrasound was used to evaluate the right common femoral artery.  It was patent .  A digital ultrasound image was acquired.  A micropuncture needle was used to access the right common femoral artery under ultrasound guidance.  An 018 wire was advanced without resistance and a micropuncture sheath was placed.  The 018 wire was removed and a benson wire was placed.  The micropuncture sheath was exchanged for a 5 french sheath.  An omniflush catheter was advanced over the wire to the  level of the diaphragm.  Abdominal angiography followed.  See results above.  I elected to attempt intervention on the superior mesenteric artery.  The patient was heparinized, and the sheath was exchanged for a 6.5 steerable sheath.  The sheath and Glidewire were used to cannulate the superior mesenteric artery.  Angiography of the superior mesenteric artery followed from a quick cross catheter to confirm true lumen.  This was exchanged for a Rosen wire.  Over the [facility] wire a 6 x 19 mm Gore VBX was delivered and deployed in standard fashion.  Follow-up angiography demonstrated excellent result with resolution of flow-limiting stenosis.  Widely patent superior mesenteric artery, widely patent right hepatic artery.  Patient was closed with a ProGlide device without issue. Impression: Successful superior mesenteric artery stenting using 6 x 19 mm Gore VBX.     Fonda FORBES Rim MD [facility] Office: [phone]    "

## 2024-04-17 NOTE — Progress Notes (Signed)
 [facility] ROUNDING NOTE   Subjective: Patient feeling better today.  She reports only having a couple of bowel movements today, and believes they were more formed.  No bowel movements overnight.  She has not vomited in over 24 hours. She is undergoing angiography today with vascular surgery.   Objective: Vital signs in last 24 hours: Temp:  [98 F (36.7 C)-98.5 F (36.9 C)] 98.2 F (36.8 C) (01/07 0823) Pulse Rate:  [59-72] 65 (01/07 0827) Resp:  [18-20] 18 (01/07 0823) BP: (134-150)/(48-56) 139/50 (01/07 0823) SpO2:  [90 %-96 %] 90 % (01/07 0823) Last BM Date : 04/17/24 General: NAD, pleasant Caucasian female, husband at bedside Lungs:  CTA b/l, no w/r/r Heart:  RRR, no m/r/g Abdomen:  Soft, minimal tenderness to palpation in the periumbilical region, ND, +BS Ext:  No c/c/e    Intake/Output from previous day: 01/06 0701 - 01/07 0700 In: 900 [P.O.:800; I.V.:100] Out: 0  Intake/Output this shift: No intake/output data recorded.   Lab Results: Recent Labs    04/15/24 0433 04/17/24 0313  WBC 9.5 6.8  HGB 13.0 10.9*  PLT 165 166  MCV 105.7* 104.8*   BMET Recent Labs    04/15/24 0433 04/17/24 0313  NA 135 138  K 4.5 3.5  CL 102 105  CO2 22 21*  GLUCOSE 133* 128*  BUN 20 23  CREATININE 0.99 1.14*  CALCIUM  8.9 8.4*   LFT Recent Labs    04/15/24 0433  PROT 6.7  ALBUMIN  3.4*  AST 22  ALT 15  ALKPHOS 79  BILITOT 0.5   PT/INR No results for input(s): INR in the last 72 hours.    Imaging/Other results: No results found.    Assessment and Plan:  84 year old female with chronic history of diarrhea and fecal incontinence, coronary artery disease with history of MI, history of stroke and diabetes with multiple admissions this month for back pain, nausea vomiting and diarrhea.  Diagnosed with C. difficile during her previous hospitalization, but reportedly unable to complete course of fidaxomicin  due to refractory nausea and vomiting.   Readmitted for ongoing diarrhea, incontinence and p.o. intolerance.   Nausea, vomiting/p.o. intolerance CT on admission with significant atherosclerosis of the SMA with poststenotic dilation.  EGD performed January 5 with marked blotchy pallor of the gastric mucosa and absence of oozing with biopsies.  These findings are very suspicious for ischemia. Vascular was consulted and she is undergoing angiography today. Agree that patient does not have typical symptoms of chronic intestinal ischemia (postprandial abdominal pain, sitophobia, weight loss) but the endoscopic findings are highly suggestive. - Her symptoms have improved in the last 24 hours, and she has been able to eat and keep down medications - Follow-up angiography results, vascular recommendations  Acute on chronic diarrhea, [name] Patient is a very difficult historian, and it appears she and her husband have been giving different histories to different providers.  They has been patient tell me that her diarrhea has definitely been more severe in the past 2 weeks compared to her usual diarrhea.  I do suspect that she does have an actual [name] on top of chronic diarrhea, and that it was undertreated due to her p.o. intolerance. Appreciate infectious disease evaluation and recommending expansion of C. difficile therapy.  As of today, it does seem that her diarrhea has improved. - Continue C. difficile treatment per ID recommendations with Flagyl  and fidaxomicin  for 7 total days - Follow-up as outpatient for chronic diarrhea. - If diarrhea  does not definitively improve with C. difficile therapy, can consider other causes of persistent diarrhea.  I told the patient not to expect complete resolution of diarrhea with the C. difficile treatment, but hopefully to get close to her baseline level of symptoms.  GI will sign off at this time.  We will arrange outpatient follow-up in our office with Cassandra Brooks, her  primary gastroenterologist.   Cassandra FORBES Holt, MD  04/17/2024, 1:43 PM [facility]  Moderate complex medical decision making (this includes chart review, review of results, face-to-face time used for counseling as well as treatment plan and follow-up. The patient was provided an opportunity to ask questions and all were answered. The patient agreed with the plan and demonstrated an understanding of the instructions

## 2024-04-17 NOTE — Progress Notes (Signed)
 " PROGRESS NOTE    Cassandra Brooks  FMW:[id number] DOB: May 21, 1940 DOA: 04/14/2024 PCP: Erick Greig LABOR, NP   Brief Narrative:  84 year old female with history of CAD cath 07/29/2022 total occlusion small posterior descending branch RCA previously aspirin  Brilinta -with complication of pain in the chest likely GI related, Fibromyalgia, Depression, HTN, HLD, chronic fecal incontinence/overflow followed by Dr. Charlanne previously on FiberCon and Imodium    Admitted 12/11-12/16 2025 progressive LBP nausea vomiting-CT ABD pelvis = right-sided pyelo-Rx Rocephin  2 g daily-diarrhea during hospital stay thought to be noninfectious patient discharged home on Amoxil --  12/27 came in similar complaint nonbilious nonbloody diarrhea subjective fever chills CT abdomen pelvis residual right-sided pyelo; C. difficile also positive Had sinus tach prompting echocardiogram-echo, ID consulted recommending fidamoxixin   1/1 returns with vomiting was taking Zofran  and Phenergan  without benefit apparently at home and no relief 1/5 GI ID consulted 1/6 EGD performed = grade C reflux esophagitis no bleeding-pale discolored mucosa gastric body suggestive of ischemia biopsied 1/7 Vascular evaluation to rule out mesenteric ischemia  Assessment & Plan:   Principal Problem:   Intractable nausea and vomiting Active Problems:   GERD (gastroesophageal reflux disease)   AAA (abdominal aortic aneurysm)   Essential hypertension   Glaucoma, both eyes   Depression, major, recurrent, in complete remission   Diabetes mellitus due to underlying condition with unspecified complications (HCC)   Spinal stenosis of lumbar region with neurogenic claudication   Mixed hyperlipidemia   CAD (coronary artery disease)   Obesity (BMI 30.0-34.9)   History of CVA (cerebrovascular accident)   Fibromyalgia   Cataract   C. difficile colitis   Imaging of gastrointestinal tract abnormal   Infectious diarrhea  Intractable nausea vomiting  poss Underlying ischemic colitis Patient could not tolerate any p.o. and CT shows esophagitis GI consulted and recommended EGD 1/6--concern for ischemia PPI 40 IV twice daily Protonix  recommended with 8 weeks of twice a day oral and once daily indefinite-continuing Zofran  4 mg IV every 6, Pepcid  40 daily, Maalox 30 mL every 6 as needed Relafen  750 twice daily from home has been held as this may be exacerbating gastritis like issues in the setting of aspirin  Plavix  At GI request consulted vascular with plans for CT angio 1/7 They are recommending a soft diet going forward-greatly appreciate input   C. difficile colitis ID consulted and recommend to complete a total of 7 days fidamoxixin as well as they added IV Flagyl  on 1/6-EOT for everything has been placed in the chart by infectious disease pharmacist   Urinary stream colonization with pyelo on one of the CTs Treated comprehensively with Rocephin  and then a 10-day course of Amoxil  last admissions Concur with ID - no further treatment necessary Continue acidophilus 1 cap daily can   History of CAD cardiac cath 2024 RCA stent Continues on amlodipine  10 atorvastatin  80 a VaPro 150 metoprolol  50 twice daily Continue aspirin  81, Plavix  75 No chest pain currently   ?  Esophagitis with nausea vomiting ?  IBS-underlying fibromyalgia Known history of fecal incontinence previously on Imodium /FiberCon and followed with Dr. Charlanne in the past have started meds as above discussion   Low back pain spinal stenosis Continue Flector  patch--- apparently not taking baclofen  at home for spasm the 10 mg not sure if we should resume and I have stopped it   Sinus tachycardia, resolved Continue metoprolol  50 twice daily  DVT prophylaxis: enoxaparin  (LOVENOX ) injection 40 mg Start: 04/14/24 2000 Code Status:   Code Status: Full Code Family Communication:  Husband at bedside  Status is: Inpatient  Dispo: The patient is from: Home               Anticipated d/c is to: Home              Anticipated d/c date is: 24 to 48 hours              Patient currently not medically stable for discharge  Consultants:  Vascular surgery, ID  Procedures:  None  Antimicrobials:  Fidaxomicin , Flagyl   Subjective: No acute issues or events overnight denies nausea vomiting diarrhea constipation headache fever chills or chest pain  Objective: Vitals:   04/16/24 0854 04/16/24 1658 04/16/24 2047 04/17/24 0427  BP:  (!) 150/56 (!) 134/48 (!) 138/55  Pulse: 66 72 72 (!) 59  Resp: 19 18 20 18   Temp:  98.3 F (36.8 C) 98.5 F (36.9 C) 98 F (36.7 C)  TempSrc:   Oral Oral  SpO2: 100% 91% 96% 91%    Intake/Output Summary (Last 24 hours) at 04/17/2024 0818 Last data filed at 04/16/2024 1835 Gross per 24 hour  Intake 900 ml  Output 0 ml  Net 900 ml   There were no vitals filed for this visit.  Examination:  General:  Pleasantly resting in bed, No acute distress. HEENT:  Normocephalic atraumatic.  Sclerae nonicteric, noninjected.  Extraocular movements intact bilaterally. Neck:  Without mass or deformity.  Trachea is midline. Lungs:  Clear to auscultate bilaterally without rhonchi, wheeze, or rales. Heart:  Regular rate and rhythm.  Without murmurs, rubs, or gallops. Abdomen:  Soft, nontender, nondistended.  Without guarding or rebound. Extremities: Without cyanosis, clubbing, edema, or obvious deformity. Skin:  Warm and dry, no erythema.  Data Reviewed: I have personally reviewed following labs and imaging studies  CBC: Recent Labs  Lab 04/10/24 1401 04/14/24 0951 04/15/24 0433 04/17/24 0313  WBC 6.6 9.9 9.5 6.8  NEUTROABS  --  7.4  --  3.6  HGB 11.8* 14.2 13.0 10.9*  HCT 35.2* 42.2 39.0 32.5*  MCV 104.8* 106.8* 105.7* 104.8*  PLT 193 205 165 166   Basic Metabolic Panel: Recent Labs  Lab 04/10/24 1401 04/14/24 0951 04/15/24 0433 04/17/24 0313  NA 137 138 135 138  K 4.2 4.1 4.5 3.5  CL 103 98 102 105  CO2 24 27 22  21*   GLUCOSE 95 150* 133* 128*  BUN 20 27* 20 23  CREATININE 0.86 1.09* 0.99 1.14*  CALCIUM  8.8* 9.7 8.9 8.4*  MG 1.9  --   --   --    GFR: Estimated Creatinine Clearance: 35.9 mL/min (A) (by C-G formula based on SCr of 1.14 mg/dL (H)). Liver Function Tests: Recent Labs  Lab 04/10/24 1401 04/14/24 0951 04/15/24 0433  AST 23 21 22   ALT 19 18 15   ALKPHOS 78 93 79  BILITOT 0.4 0.4 0.5  PROT 6.1* 7.6 6.7  ALBUMIN  3.3* 3.8 3.4*   Recent Labs  Lab 04/14/24 1143  LIPASE 32   No results for input(s): AMMONIA in the last 168 hours. Coagulation Profile: No results for input(s): INR, PROTIME in the last 168 hours. Cardiac Enzymes: No results for input(s): CKTOTAL, CKMB, CKMBINDEX, TROPONINI in the last 168 hours. BNP (last 3 results) No results for input(s): PROBNP in the last 8760 hours. HbA1C: No results for input(s): HGBA1C in the last 72 hours. CBG: Recent Labs  Lab 04/10/24 1146 04/10/24 1709 04/10/24 2124 04/11/24 0805  GLUCAP 114* 112* 135* 161*   Lipid  Profile: No results for input(s): CHOL, HDL, LDLCALC, TRIG, CHOLHDL, LDLDIRECT in the last 72 hours. Thyroid  Function Tests: No results for input(s): TSH, T4TOTAL, FREET4, T3FREE, THYROIDAB in the last 72 hours. Anemia Panel: No results for input(s): VITAMINB12, FOLATE, FERRITIN, TIBC, IRON, RETICCTPCT in the last 72 hours. Sepsis Labs: Recent Labs  Lab 04/14/24 0955  LATICACIDVEN 1.9    Recent Results (from the past 240 hours)  Urine Culture     Status: Abnormal   Collection Time: 04/14/24  1:09 PM   Specimen: Urine, Random  Result Value Ref Range Status   Specimen Description URINE, RANDOM  Final   Special Requests   Final    NONE Reflexed from [id number] Performed at [facility], [address] [place], [place] [address]    Culture (A)  Final    >=100,000 COLONIES/mL ESCHERICHIA COLI Confirmed Extended Spectrum Beta-Lactamase Producer (ESBL).  In  bloodstream infections from ESBL organisms, carbapenems are preferred over piperacillin/tazobactam. They are shown to have a lower risk of mortality.    Report Status 04/16/2024 FINAL  Final   Organism ID, Bacteria ESCHERICHIA COLI (A)  Final      Susceptibility   Escherichia coli - MIC*    AMPICILLIN >=32 RESISTANT Resistant     CEFAZOLIN  (URINE) Value in next row Resistant      >=32 RESISTANTThis is a modified FDA-approved test that has been validated and its performance characteristics determined by the reporting laboratory.  This laboratory is certified under the Clinical Laboratory Improvement Amendments CLIA as qualified to perform high complexity clinical laboratory testing.    CEFEPIME Value in next row Resistant      >=32 RESISTANTThis is a modified FDA-approved test that has been validated and its performance characteristics determined by the reporting laboratory.  This laboratory is certified under the Clinical Laboratory Improvement Amendments CLIA as qualified to perform high complexity clinical laboratory testing.    ERTAPENEM Value in next row Sensitive      >=32 RESISTANTThis is a modified FDA-approved test that has been validated and its performance characteristics determined by the reporting laboratory.  This laboratory is certified under the Clinical Laboratory Improvement Amendments CLIA as qualified to perform high complexity clinical laboratory testing.    CEFTRIAXONE  Value in next row Resistant      >=32 RESISTANTThis is a modified FDA-approved test that has been validated and its performance characteristics determined by the reporting laboratory.  This laboratory is certified under the Clinical Laboratory Improvement Amendments CLIA as qualified to perform high complexity clinical laboratory testing.    CIPROFLOXACIN Value in next row Resistant      >=32 RESISTANTThis is a modified FDA-approved test that has been validated and its performance characteristics determined by the  reporting laboratory.  This laboratory is certified under the Clinical Laboratory Improvement Amendments CLIA as qualified to perform high complexity clinical laboratory testing.    GENTAMICIN Value in next row Sensitive      >=32 RESISTANTThis is a modified FDA-approved test that has been validated and its performance characteristics determined by the reporting laboratory.  This laboratory is certified under the Clinical Laboratory Improvement Amendments CLIA as qualified to perform high complexity clinical laboratory testing.    NITROFURANTOIN Value in next row Sensitive      >=32 RESISTANTThis is a modified FDA-approved test that has been validated and its performance characteristics determined by the reporting laboratory.  This laboratory is certified under the Clinical Laboratory Improvement Amendments CLIA as qualified to perform high complexity  clinical laboratory testing.    TRIMETH/SULFA Value in next row Sensitive      >=32 RESISTANTThis is a modified FDA-approved test that has been validated and its performance characteristics determined by the reporting laboratory.  This laboratory is certified under the Clinical Laboratory Improvement Amendments CLIA as qualified to perform high complexity clinical laboratory testing.    AMPICILLIN/SULBACTAM Value in next row Sensitive      >=32 RESISTANTThis is a modified FDA-approved test that has been validated and its performance characteristics determined by the reporting laboratory.  This laboratory is certified under the Clinical Laboratory Improvement Amendments CLIA as qualified to perform high complexity clinical laboratory testing.    PIP/TAZO Value in next row Sensitive      <=4 SENSITIVEThis is a modified FDA-approved test that has been validated and its performance characteristics determined by the reporting laboratory.  This laboratory is certified under the Clinical Laboratory Improvement Amendments CLIA as qualified to perform high complexity  clinical laboratory testing.    MEROPENEM Value in next row Sensitive      <=4 SENSITIVEThis is a modified FDA-approved test that has been validated and its performance characteristics determined by the reporting laboratory.  This laboratory is certified under the Clinical Laboratory Improvement Amendments CLIA as qualified to perform high complexity clinical laboratory testing.    * >=100,000 COLONIES/mL ESCHERICHIA COLI  Resp panel by RT-PCR (RSV, Flu A&B, Covid) Anterior Nasal Swab     Status: None   Collection Time: 04/14/24  3:29 PM   Specimen: Anterior Nasal Swab  Result Value Ref Range Status   SARS Coronavirus 2 by RT PCR NEGATIVE NEGATIVE Final   Influenza A by PCR NEGATIVE NEGATIVE Final   Influenza B by PCR NEGATIVE NEGATIVE Final    Comment: (NOTE) The Xpert Xpress SARS-CoV-2/FLU/RSV plus assay is intended as an aid in the diagnosis of influenza from Nasopharyngeal swab specimens and should not be used as a sole basis for treatment. Nasal washings and aspirates are unacceptable for Xpert Xpress SARS-CoV-2/FLU/RSV testing.  Fact Sheet for Patients: bloggercourse.com  Fact Sheet for Healthcare Providers: seriousbroker.it  This test is not yet approved or cleared by the [place]  FDA and has been authorized for detection and/or diagnosis of SARS-CoV-2 by FDA under an Emergency Use Authorization (EUA). This EUA will remain in effect (meaning this test can be used) for the duration of the COVID-19 declaration under Section 564(b)(1) of the Act, 21 U.S.C. section 360bbb-3(b)(1), unless the authorization is terminated or revoked.     Resp Syncytial Virus by PCR NEGATIVE NEGATIVE Final    Comment: (NOTE) Fact Sheet for Patients: bloggercourse.com  Fact Sheet for Healthcare Providers: seriousbroker.it  This test is not yet approved or cleared by the [place]  FDA  and has been authorized for detection and/or diagnosis of SARS-CoV-2 by FDA under an Emergency Use Authorization (EUA). This EUA will remain in effect (meaning this test can be used) for the duration of the COVID-19 declaration under Section 564(b)(1) of the Act, 21 U.S.C. section 360bbb-3(b)(1), unless the authorization is terminated or revoked.  Performed at [facility], [address] [place], [place] [address]   Gastrointestinal Panel by PCR , Stool     Status: None   Collection Time: 04/15/24  7:55 PM   Specimen: Stool  Result Value Ref Range Status   Campylobacter species NOT DETECTED NOT DETECTED Final   Plesimonas shigelloides NOT DETECTED NOT DETECTED Final   Salmonella species NOT DETECTED NOT DETECTED Final  Yersinia enterocolitica NOT DETECTED NOT DETECTED Final   Vibrio species NOT DETECTED NOT DETECTED Final   Vibrio cholerae NOT DETECTED NOT DETECTED Final   Enteroaggregative E coli (EAEC) NOT DETECTED NOT DETECTED Final   Enteropathogenic E coli (EPEC) NOT DETECTED NOT DETECTED Final   Enterotoxigenic E coli (ETEC) NOT DETECTED NOT DETECTED Final   Shiga like toxin producing E coli (STEC) NOT DETECTED NOT DETECTED Final   Shigella/Enteroinvasive E coli (EIEC) NOT DETECTED NOT DETECTED Final   Cryptosporidium NOT DETECTED NOT DETECTED Final   Cyclospora cayetanensis NOT DETECTED NOT DETECTED Final   Entamoeba histolytica NOT DETECTED NOT DETECTED Final   Giardia lamblia NOT DETECTED NOT DETECTED Final   Adenovirus F40/41 NOT DETECTED NOT DETECTED Final   Astrovirus NOT DETECTED NOT DETECTED Final   Norovirus GI/GII NOT DETECTED NOT DETECTED Final   Rotavirus A NOT DETECTED NOT DETECTED Final   Sapovirus (I, II, IV, and V) NOT DETECTED NOT DETECTED Final    Comment: Performed at [facility], [address] [place], [place] [address]         Radiology Studies: No results found.      Scheduled Meds:  acidophilus  1 capsule Oral Daily    amLODipine   10 mg Oral Daily   aspirin  EC  81 mg Oral QHS   atorvastatin   80 mg Oral Daily   clopidogrel   75 mg Oral Daily   diclofenac   1 patch Transdermal BID   diclofenac  Sodium  2 g Topical QID   dorzolamide -timolol   1 drop Both Eyes BID   enoxaparin  (LOVENOX ) injection  40 mg Subcutaneous Q24H   famotidine   40 mg Oral Daily   fidaxomicin   200 mg Oral BID   irbesartan   150 mg Oral Daily   metoprolol  tartrate  50 mg Oral BID   ondansetron  (ZOFRAN ) IV  4 mg Intravenous Q6H   pantoprazole  (PROTONIX ) IV  40 mg Intravenous Q12H   sodium chloride  flush  3 mL Intravenous Q12H   venlafaxine  XR  150 mg Oral Q breakfast   Continuous Infusions:  sodium chloride  100 mL/hr at 04/17/24 9356   metronidazole  500 mg (04/17/24 0650)     LOS: 2 days    Time spent: min    Elsie JAYSON Montclair, DO [facility]  If 7PM-7AM, please contact night-coverage [email or web address]  04/17/2024, 8:18 AM      "

## 2024-04-17 NOTE — Consult Note (Signed)
 " Hospital Consult  Patient seen and examined in preop holding.  No complaints. No changes to medication history or physical exam since last seen. In short the patient has symptoms concerning for chronic mesenteric ischemia with severely diseased superior mesenteric artery which demonstrates a replaced right hepatic artery 15 mm from the ostia. After discussing the risks and benefits of visceral angiography with possible SMA stenting, Cassandra Brooks elected to proceed.   Fonda FORBES Rim MD   Reason for Consult:  possible mesenteric ischemia Requesting Physician:  Royal MRN #:  [id number]  History of Present Illness: This is a 84 y.o. female who presented to the hospital in December with multiple admissions with back pain and intermittent episodes of vomiting.  She was found to have pyelonephritis and was treated with antibiotics and developed diarrhea and admitted again and found to have E coli and C diff positive and was treated per ID.  She came back to the hospital 2 days ago as she was unable to tolerated her medications due to intractable N/V.  She did have a CT scan and was found to have severe SMA stenosis.   Vascular surgery is consulted.    She has PMH of DM, lumbar spinal stenosis, CAD, fibromyalgia, HTN, GERD, CVA, HLD.  She has normal renal function.  Her husband states that with her stroke, she couldn't talk and still has some trouble finding words that she wants to say.    She tells me about 60 years ago, she had an aneurysm in her stomach and was bleeding out into her stomach.  She states she was told they put some sort of clamp in place to fix the bleeding.  She subsequently had another exploratory lap years later and her husband states they didn't find anything and she just had gas.  She states that after her aneurysm surgery, she was pregnant and would develop dizziness.  She has hx of hysterectomy.  As far as her N/V, she states she gets nauseated just thinking about food.   She states that she gets nauseated before she eats.  She did state before this she gets some nausea after eating.  She states this has been going on for some time but cannot pinpoint how long.  She does not know if she has lost weight.    The pt is on a statin for cholesterol management.  The pt is on a daily aspirin .   Other AC:  Plavix . Lovenox  for DVT proph The pt is on CCB, ARB, BB for hypertension.   The pt is is not on medication for diabetes PTA. Tobacco hx:  never  Past Medical History:  Diagnosis Date   AAA (abdominal aortic aneurysm)    ACS (acute coronary syndrome) (HCC) 08/01/2022   Arthritis    knees (03/21/2017)   Borderline diabetes    Bulging lumbar disc    CAD (coronary artery disease) 08/17/2022   Depression    Diabetes mellitus due to underlying condition with unspecified complications (HCC) 12/19/2019   Disequilibrium    Dizziness    Essential hypertension 12/19/2019   Fall    Fibromyalgia    GERD (gastroesophageal reflux disease)    Glaucoma, both eyes    Hemoptysis 03/18/2015   Hyperlipidemia    don't have it but I take RX (03/21/2017)   Hypertension    Mixed dyslipidemia 12/19/2019   Mixed hyperlipidemia 04/18/2017   Nuclear cataract of both eyes 01/01/2014   Obesity (BMI 30.0-34.9) 09/29/2022   Plantar fasciitis 10/21/2020  Posterior tibial tendon dysfunction (PTTD) of right lower extremity 11/16/2021   Pre-diabetes    pt states she is not diabetic. Last A1C was normal. Pt is on no medications for DM   Preoperative cardiovascular examination 12/19/2019   Primary osteoarthritis of left foot 10/21/2020   Primary osteoarthritis of left knee 07/18/2019   Solitary pulmonary nodule 03/18/2015   03/2015 6mm RLL nodule    Spinal stenosis of lumbar region with neurogenic claudication 04/26/2021   Spinal stenosis, lumbar region with neurogenic claudication 04/26/2021   Central stenosis, L2-3 and L3-4 with moderate foramenal narrowing L4-5   Status post  AAA (abdominal aortic aneurysm) repair 12/19/2019   Status post total left knee replacement 03/27/2020   STEMI (ST elevation myocardial infarction) (HCC) 08/01/2022   STEMI involving right coronary artery (HCC) 07/29/2022   Syncope 03/21/2017   Syncope and collapse    TIA (transient ischemic attack) 08/03/2023    Past Surgical History:  Procedure Laterality Date   ABDOMINAL AORTIC ANEURYSM REPAIR  1966   APPENDECTOMY  1966   CARDIAC CATHETERIZATION     20+ years ago, no abnormalities found   COLONOSCOPY  11/02/2016   Dr Towana. A few diverticuli were seen in the sigmoid colon. Otherwise normal colonoscopy to cecum   CORONARY/GRAFT ACUTE MI REVASCULARIZATION N/A 07/29/2022   Procedure: Coronary/Graft Acute MI Revascularization;  Surgeon: Burnard Debby LABOR, MD;  Location[facility] INVASIVE CV LAB;  Service: Cardiovascular;  Laterality: N/A;   ESOPHAGOGASTRODUODENOSCOPY N/A 04/16/2024   Procedure: EGD (ESOPHAGOGASTRODUODENOSCOPY);  Surgeon: Stacia Glendia BRAVO, MD;  Location: [facility] ENDOSCOPY;  Service: Gastroenterology;  Laterality: N/A;   LEFT HEART CATH AND CORONARY ANGIOGRAPHY N/A 07/29/2022   Procedure: LEFT HEART CATH AND CORONARY ANGIOGRAPHY;  Surgeon: Burnard Debby LABOR, MD;  Location: [facility] INVASIVE CV LAB;  Service: Cardiovascular;  Laterality: N/A;   LUMBAR LAMINECTOMY/DECOMPRESSION MICRODISCECTOMY N/A 04/26/2021   Procedure: CENTRAL LAMINECTOMIES LUMBAR TWO-THREE,  LUMBAR THREE-FOUR WITH BILATERAL FORAMINOTOMIES;  Surgeon: Lucilla Lynwood BRAVO, MD;  Location: [facility] OR;  Service: Orthopedics;  Laterality: N/A;   TOTAL KNEE ARTHROPLASTY Left 03/27/2020   Procedure: LEFT TOTAL KNEE ARTHROPLASTY;  Surgeon: Jerri Kay HERO, MD;  Location: [facility] OR;  Service: Orthopedics;  Laterality: Left;   VAGINAL HYSTERECTOMY     partial   VIDEO BRONCHOSCOPY Bilateral 03/19/2015   Procedure: VIDEO BRONCHOSCOPY WITHOUT FLUORO;  Surgeon: Harden Jude GAILS, MD;  Location: [facility] ENDOSCOPY;  Service: Cardiopulmonary;  Laterality:  Bilateral;    Allergies[1]  Prior to Admission medications  Medication Sig Start Date End Date Taking? Authorizing Provider  acetaminophen  (TYLENOL ) 650 MG CR tablet Take 650 mg by mouth 2 (two) times daily.   Yes [provider]  amLODipine  (NORVASC ) 10 MG tablet Take 10 mg by mouth daily. 02/20/24  Yes [provider]  aspirin  EC 81 MG tablet Take 1 tablet (81 mg total) by mouth at bedtime. 08/05/23  Yes Dennise Lavada POUR, MD  atorvastatin  (LIPITOR ) 80 MG tablet Take 1 tablet (80 mg total) by mouth daily. 08/02/22  Yes Henry Manuelita NOVAK, NP  azelastine (ASTELIN) 0.1 % nasal spray Place 1-2 sprays into both nostrils 2 (two) times daily as needed for rhinitis or allergies.   Yes [provider]  baclofen  (LIORESAL ) 10 MG tablet Take 1 tablet (10 mg total) by mouth at bedtime as needed for muscle spasms. 04/01/24  Yes Williams, Megan E, NP  clopidogrel  (PLAVIX ) 75 MG tablet Take 1 tablet (75 mg total) by mouth daily. 08/05/23  Yes Dennise Lavada POUR, MD  diclofenac  (FLECTOR ) 1.3 % PTCH Place 1 patch onto the skin 2 (two) times daily. 04/11/24  Yes Samtani, Jai-Gurmukh, MD  diclofenac  Sodium (VOLTAREN ) 1 % GEL Apply 2 g topically 4 (four) times daily. 04/11/24  Yes Samtani, Jai-Gurmukh, MD  dorzolamide -timolol  (COSOPT ) 22.3-6.8 MG/ML ophthalmic solution Place 1 drop into both eyes 2 (two) times daily.   Yes [provider]  famotidine  (PEPCID ) 40 MG tablet Take 1 tablet (40 mg total) by mouth daily. 04/12/24  Yes Samtani, Jai-Gurmukh, MD  fidaxomicin  (DIFICID ) 200 MG TABS tablet Take 1 tablet (200 mg total) by mouth 2 (two) times daily for 5 days. 04/11/24 04/16/24 Yes Samtani, Jai-Gurmukh, MD  fluticasone  (FLONASE ) 50 MCG/ACT nasal spray Place 2 sprays into both nostrils daily. 11/08/19  Yes [provider]  metoprolol  tartrate (LOPRESSOR ) 50 MG tablet Take 1 tablet (50 mg total) by mouth 2 (two) times daily. 04/11/24  Yes Samtani, Jai-Gurmukh, MD  Multiple  Vitamins-Minerals (MULTIVITAMIN WOMEN 50+) TABS Take 1 tablet by mouth daily.   Yes [provider]  nabumetone  (RELAFEN ) 750 MG tablet Take 750 mg by mouth 2 (two) times daily. 02/21/17  Yes [provider]  nitroGLYCERIN  (NITROSTAT ) 0.4 MG SL tablet Place 0.4 mg under the tongue every 5 (five) minutes as needed for chest pain.   Yes [provider]  ondansetron  (ZOFRAN -ODT) 4 MG disintegrating tablet Take 1 tablet (4 mg total) by mouth every 4 (four) hours as needed. 03/26/24  Yes Fausto Sor A, DO  Probiotic Product (PROBIOTIC PO) Take 1 capsule by mouth daily.   Yes [provider]  ROCKLATAN  0.02-0.005 % SOLN Apply 1 drop to eye at bedtime. 02/09/24  Yes [provider]  valsartan  (DIOVAN ) 160 MG tablet Take 1 tablet (160 mg total) by mouth at bedtime. 08/07/23  Yes Dennise Lavada POUR, MD  venlafaxine  XR (EFFEXOR -XR) 150 MG 24 hr capsule Take 1 capsule (150 mg total) by mouth daily with breakfast. 03/28/17  Yes Erle Hails, DO    Social History   Socioeconomic History   Marital status: Married    Spouse name: Not on file   Number of children: Not on file   Years of education: Not on file   Highest education level: Not on file  Occupational History   Occupation: retired    Comment: designer, fashion/clothing; dietician   Occupation: retired  Tobacco Use   Smoking status: Never   Smokeless tobacco: Never  Vaping Use   Vaping status: Never Used  Substance and Sexual Activity   Alcohol use: No   Drug use: No   Sexual activity: Not on file  Other Topics Concern   Not on file  Social History Narrative   Not on file   Social Drivers of Health   Tobacco Use: Low Risk (04/16/2024)   Patient History    Smoking Tobacco Use: Never    Smokeless Tobacco Use: Never    Passive Exposure: Not on file  Financial Resource Strain: Not on file  Food Insecurity: No Food Insecurity (04/14/2024)   Epic    Worried About Programme Researcher, Broadcasting/film/video in the Last Year: Never  true    Ran Out of Food in the Last Year: Never true  Transportation Needs: No Transportation Needs (04/14/2024)   Epic    Lack of Transportation (Medical): No    Lack of Transportation (Non-Medical): No  Physical Activity: Not on file  Stress: Not on file  Social Connections: Socially Integrated (04/14/2024)   Social Connection and Isolation Panel  Frequency of Communication with Friends and Family: Three times a week    Frequency of Social Gatherings with Friends and Family: Three times a week    Attends Religious Services: More than 4 times per year    Active Member of Clubs or Organizations: Yes    Attends Banker Meetings: 1 to 4 times per year    Marital Status: Married  Catering Manager Violence: Not At Risk (04/14/2024)   Epic    Fear of Current or Ex-Partner: No    Emotionally Abused: No    Physically Abused: No    Sexually Abused: No  Depression (PHQ2-9): Not on file  Alcohol Screen: Not on file  Housing: Low Risk (04/14/2024)   Epic    Unable to Pay for Housing in the Last Year: No    Number of Times Moved in the Last Year: 0    Homeless in the Last Year: No  Utilities: Not At Risk (04/14/2024)   Epic    Threatened with loss of utilities: No  Health Literacy: Not on file    Family History  Problem Relation Age of Onset   Hypertension Father    Stomach cancer Father    Glaucoma Mother    Diabetes Mother    Diabetes Sister    Diabetes Brother    Lupus Child    Diabetes Brother    Lung cancer Brother    Colon cancer Brother     ROS: [x]  Positive   [ ]  Negative   [ ]  All sytems reviewed and are negative  Cardiac: [x]  CAD [x]  HLD [x]  HTN   Vascular: []  pain in legs while walking []  pain in legs at rest []  pain in legs at night []  non-healing ulcers []  hx of DVT []  swelling in legs  Pulmonary: []  asthma/wheezing []  home O2  Neurologic: [x]  hx of CVA []  mini stroke   Hematologic: []  hx of cancer  Endocrine:   [x]  diabetes []  thyroid   disease  GI [x]  GERD [x]  N/V/abdominal pain [x]  C diff  GU: []  CKD/renal failure []  HD--[]  M/W/F or []  T/T/S  Psychiatric: []  anxiety [x]  depression  Musculoskeletal: [x]  spinal stenosis   Integumentary: []  rashes []  ulcers  Constitutional: []  fever  []  chills  Physical Examination  Vitals:   04/17/24 0823 04/17/24 0827  BP: (!) 139/50   Pulse:  65  Resp: 18   Temp: 98.2 F (36.8 C)   SpO2: 90%     General:  WDWN in NAD Gait: Not observed HENT: WNL, normocephalic Pulmonary: normal non-labored breathing Cardiac: regular Abdomen:  soft, NT; aortic pulse is not palpable Skin: without rashes Vascular Exam/Pulses:  Right Left  Radial 2+ (normal) 2+ (normal)  DP 2+ (normal) 2+ (normal)    Musculoskeletal: no muscle wasting or atrophy  Neurologic: A&O X 3 Psychiatric:  The pt has Normal affect.   CBC    Component Value Date/Time   WBC 6.8 04/17/2024 0313   RBC 3.10 (L) 04/17/2024 0313   HGB 10.9 (L) 04/17/2024 0313   HCT 32.5 (L) 04/17/2024 0313   HCT 42.3 03/22/2017 0336   PLT 166 04/17/2024 0313   MCV 104.8 (H) 04/17/2024 0313   MCH 35.2 (H) 04/17/2024 0313   MCHC 33.5 04/17/2024 0313   RDW 13.9 04/17/2024 0313   LYMPHSABS 2.1 04/17/2024 0313   MONOABS 0.8 04/17/2024 0313   EOSABS 0.2 04/17/2024 0313   BASOSABS 0.0 04/17/2024 0313    BMET    Component Value  Date/Time   NA 138 04/17/2024 0313   NA 141 11/23/2022 0854   K 3.5 04/17/2024 0313   CL 105 04/17/2024 0313   CO2 21 (L) 04/17/2024 0313   GLUCOSE 128 (H) 04/17/2024 0313   BUN 23 04/17/2024 0313   BUN 28 (H) 11/23/2022 0854   CREATININE 1.14 (H) 04/17/2024 0313   CALCIUM  8.4 (L) 04/17/2024 0313   GFRNONAA 48 (L) 04/17/2024 0313   GFRAA 54 (L) 08/23/2019 1747    COAGS: Lab Results  Component Value Date   INR 1.1 04/05/2024   INR 1.1 03/22/2024   INR 1.1 08/03/2023     Non-Invasive Vascular Imaging:   CT a/p with contrast 04/14/2024 IMPRESSION: 1. There is persistent  hypoenhancement of the superomedial pole of the RIGHT kidney. This is favored to reflect sequela of pyelonephritis. Recommend correlation with urinalysis. 2. Small hiatal hernia with circumferential wall thickening of the distal esophagus. This could reflect esophagitis. 3. Severe atherosclerotic calcifications at the origin of the SMA with a small area of poststenotic dilation. 4. Mild subpleural reticulation with some possible interlobular septal thickening. This could reflect mild pulmonary edema superimposed on a background of mild interstitial lung disease.    ASSESSMENT/PLAN: This is a 84 y.o. female with N/V recently with C diff found to have SMA stenosis and vascular surgery is consulted.  She has hx of exploratory lap x 2 in the distant past, one for rupture aneurysm with repair many years ago.    -pt with palpable pulses bilateral radial/DP.  She was found to have an SMA stenosis.  Upon talking with pt, she does not describe classic symptoms of mesenteric ischemia.  -Dr. Pearline will review CT scan evaluate pt and determine further plan -continue asa/statin   Lucie Apt, PA-C [facility] [phone]   VASCULAR STAFF ADDENDUM: I have independently interviewed and examined the patient. I agree with the above.  The patient and husband agree that she does mostly suffer from nausea episodes.  These are intermittent and are not necessarily always related to food.  Sometimes related to getting up and moving to a new spot in the house.  But they also do describe some element of postprandial pain.  It is not with every meal but she does have abdominal pain after eating intermittently, she reports it being 8 out of 10.  I explained with her to offer her angiogram as a diagnostic procedure and potentially intervene on any significant SMA stenosis if stenting.  I explained that this could potentially help her postprandial pain but will unlikely help her intermittent  nausea that is not necessarily related to food intake. We discussed the risks and benefits and specifically discussed the risk of groin complication.  Her and her husband wish to proceed. Will be scheduled with Dr. Lanis tomorrow in the Cath Lab.  Fonda FORBES Lanis MD [facility] Phone Number: [phone] 04/17/2024 2:09 PM      [1]  Allergies Allergen Reactions   Zestril  [Lisinopril ] Cough   "

## 2024-04-18 ENCOUNTER — Other Ambulatory Visit (HOSPITAL_COMMUNITY): Payer: Self-pay

## 2024-04-18 ENCOUNTER — Encounter (HOSPITAL_COMMUNITY): Payer: Self-pay | Admitting: Vascular Surgery

## 2024-04-18 LAB — CBC WITH DIFFERENTIAL/PLATELET
Abs Immature Granulocytes: 0.02 K/uL (ref 0.00–0.07)
Basophils Absolute: 0 K/uL (ref 0.0–0.1)
Basophils Relative: 1 %
Eosinophils Absolute: 0.2 K/uL (ref 0.0–0.5)
Eosinophils Relative: 4 %
HCT: 32.4 % — ABNORMAL LOW (ref 36.0–46.0)
Hemoglobin: 10.7 g/dL — ABNORMAL LOW (ref 12.0–15.0)
Immature Granulocytes: 0 %
Lymphocytes Relative: 27 %
Lymphs Abs: 1.4 K/uL (ref 0.7–4.0)
MCH: 35.7 pg — ABNORMAL HIGH (ref 26.0–34.0)
MCHC: 33 g/dL (ref 30.0–36.0)
MCV: 108 fL — ABNORMAL HIGH (ref 80.0–100.0)
Monocytes Absolute: 0.5 K/uL (ref 0.1–1.0)
Monocytes Relative: 10 %
Neutro Abs: 3 K/uL (ref 1.7–7.7)
Neutrophils Relative %: 58 %
Platelets: 149 K/uL — ABNORMAL LOW (ref 150–400)
RBC: 3 MIL/uL — ABNORMAL LOW (ref 3.87–5.11)
RDW: 14 % (ref 11.5–15.5)
WBC: 5.2 K/uL (ref 4.0–10.5)
nRBC: 0 % (ref 0.0–0.2)

## 2024-04-18 LAB — BASIC METABOLIC PANEL WITH GFR
Anion gap: 9 (ref 5–15)
BUN: 21 mg/dL (ref 8–23)
CO2: 21 mmol/L — ABNORMAL LOW (ref 22–32)
Calcium: 7.9 mg/dL — ABNORMAL LOW (ref 8.9–10.3)
Chloride: 111 mmol/L (ref 98–111)
Creatinine, Ser: 1.05 mg/dL — ABNORMAL HIGH (ref 0.44–1.00)
GFR, Estimated: 52 mL/min — ABNORMAL LOW
Glucose, Bld: 105 mg/dL — ABNORMAL HIGH (ref 70–99)
Potassium: 4 mmol/L (ref 3.5–5.1)
Sodium: 141 mmol/L (ref 135–145)

## 2024-04-18 LAB — SURGICAL PATHOLOGY

## 2024-04-18 LAB — LIPID PANEL
Cholesterol: 64 mg/dL (ref 0–200)
HDL: 35 mg/dL — ABNORMAL LOW
LDL Cholesterol: 12 mg/dL (ref 0–99)
Total CHOL/HDL Ratio: 1.8 ratio
Triglycerides: 83 mg/dL
VLDL: 17 mg/dL (ref 0–40)

## 2024-04-18 MED ORDER — ACETAMINOPHEN 325 MG PO TABS
650.0000 mg | ORAL_TABLET | ORAL | 0 refills | Status: AC | PRN
  Filled 2024-04-18: qty 30, 3d supply, fill #0

## 2024-04-18 MED ORDER — METRONIDAZOLE 500 MG PO TABS
500.0000 mg | ORAL_TABLET | Freq: Two times a day (BID) | ORAL | 0 refills | Status: AC
  Filled 2024-04-18: qty 8, 4d supply, fill #0

## 2024-04-18 MED ORDER — FIDAXOMICIN 200 MG PO TABS
200.0000 mg | ORAL_TABLET | Freq: Two times a day (BID) | ORAL | 0 refills | Status: AC
  Filled 2024-04-18: qty 8, 4d supply, fill #0

## 2024-04-18 MED FILL — Lidocaine Inj 1% w/ Epinephrine-1:100000: INTRAMUSCULAR | Qty: 20 | Status: AC

## 2024-04-18 NOTE — Discharge Summary (Addendum)
 Physician Discharge Summary  Cassandra Brooks FMW:[id number] DOB: 1940/11/30 DOA: 04/14/2024  PCP: Erick Greig LABOR, NP  Admit date: 04/14/2024 Discharge date: 04/18/2024  Admitted From: Home Disposition: Home  Recommendations for Outpatient Follow-up:  Follow up with PCP in 1-2 weeks Follow-up with vascular surgery in 1 month for mesenteric duplex/further evaluation Follow-up with GI as scheduled  Home Health: None Equipment/Devices: None  Discharge Condition: Stable CODE STATUS: Full Diet recommendation: Low-salt low-fat low-carb diet  Brief/Interim Summary: Patient is a pleasant 84 year old female with history of CAD cath 07/29/2022 total occlusion small posterior descending branch RCA previously aspirin  Brilinta -with complication of pain in the chest likely GI related, Fibromyalgia, Depression, HTN, HLD, chronic fecal incontinence/overflow followed by Dr. Charlanne previously on FiberCon and Imodium  who presents to our facility after recent admissions for nausea vomiting and pyelonephritis with subsequent episode of diarrhea and C. difficile positive status.  She reports poorly controlled symptoms of nausea vomiting limiting her ability to take p.o. medications (including for maximizing) resulting in worsening diarrhea.   1/1 returns with vomiting was taking Zofran  and Phenergan  without benefit apparently at home and no relief 1/5 GI, ID consulted -recommending  fidaxomicin  through 1/12 1/6 EGD performed = grade C reflux esophagitis no bleeding-pale discolored mucosa gastric body suggestive of ischemia biopsied 1/7 Vascular evaluation to rule out mesenteric ischemia -noted stenosis status post stenting -tolerated well  Patient evaluated as above, given endoscopy findings and mesenteric stenosis status post stenting patient symptoms have improved somewhat over the past 24 hours but have not yet resolved.  Discussed that patient's symptoms may take time to recovery and in the interim would recommend  smaller more frequent meals.  She is at this time otherwise stable and agreeable for discharge home with ongoing outpatient follow-up with PCP, GI and vascular surgery as scheduled(see above). At the time of discharge patient had episode of light-headedness upon standing rapidly - orthostatics mildly positive - discussed safe transitioning at home and to avoid rapid change of position especially from supine to standing.  Discharge Diagnoses:  Principal Problem:   Intractable nausea and vomiting Active Problems:   GERD (gastroesophageal reflux disease)   AAA (abdominal aortic aneurysm)   Essential hypertension   Glaucoma, both eyes   Depression, major, recurrent, in complete remission   Diabetes mellitus due to underlying condition with unspecified complications (HCC)   Spinal stenosis of lumbar region with neurogenic claudication   Mixed hyperlipidemia   CAD (coronary artery disease)   Obesity (BMI 30.0-34.9)   History of CVA (cerebrovascular accident)   Fibromyalgia   Cataract   C. difficile colitis   Imaging of gastrointestinal tract abnormal   Infectious diarrhea  Intractable nausea vomiting Underlying ischemic colitis -GI status post EGD on 1/6 concerning for ischemia - Vascular surgery evaluated with notable mesenteric stenosis on 1/7 tolerated stenting quite well - Continue PPI  C. difficile colitis, ongoing, POA - ID consulted and recommend to complete a total of 7 days fidamoxixin as well as Flagyl  through 1/12   Urinary stream colonization with pyelo on one of the CTs -No further treatment necessary, continue probiotics   History of CAD cardiac cath 2024 RCA stent Continues on amlodipine  10 atorvastatin  80 a VaPro 150 metoprolol  50 twice daily Continue aspirin  81, Plavix  75   ?  Esophagitis with nausea vomiting ?  IBS-underlying fibromyalgia Known history of fecal incontinence previously on Imodium /FiberCon and followed with Dr. Charlanne in the past  Low back pain  spinal stenosis Continue Flector  patch--- DC baclofen   Sinus tachycardia, resolved Continue metoprolol  50 twice daily   Discharge Instructions  Discharge Instructions     Call MD for:  difficulty breathing, headache or visual disturbances   Complete by: As directed    Call MD for:  extreme fatigue   Complete by: As directed    Call MD for:  hives   Complete by: As directed    Call MD for:  persistant dizziness or light-headedness   Complete by: As directed    Call MD for:  persistant nausea and vomiting   Complete by: As directed    Call MD for:  severe uncontrolled pain   Complete by: As directed    Call MD for:  temperature >100.4   Complete by: As directed    Diet renal/carb modified with fluid restriction   Complete by: As directed    Discharge instructions   Complete by: As directed    Follow-up with PCP 1 to 2 weeks, follow-up with vascular surgery in 1 month for repeat imaging and evaluation   Increase activity slowly   Complete by: As directed       Allergies as of 04/18/2024       Reactions   Zestril  [lisinopril ] Cough        Medication List     STOP taking these medications    baclofen  10 MG tablet Commonly known as: LIORESAL    nabumetone  750 MG tablet Commonly known as: RELAFEN        TAKE these medications    acetaminophen  650 MG CR tablet Commonly known as: TYLENOL  Take 650 mg by mouth 2 (two) times daily.   amLODipine  10 MG tablet Commonly known as: NORVASC  Take 10 mg by mouth daily.   aspirin  EC 81 MG tablet Take 1 tablet (81 mg total) by mouth at bedtime.   atorvastatin  80 MG tablet Commonly known as: LIPITOR  Take 1 tablet (80 mg total) by mouth daily.   azelastine 0.1 % nasal spray Commonly known as: ASTELIN Place 1-2 sprays into both nostrils 2 (two) times daily as needed for rhinitis or allergies.   clopidogrel  75 MG tablet Commonly known as: PLAVIX  Take 1 tablet (75 mg total) by mouth daily.   diclofenac  1.3 %  Ptch Commonly known as: FLECTOR  Place 1 patch onto the skin 2 (two) times daily.   diclofenac  Sodium 1 % Gel Commonly known as: VOLTAREN  Apply 2 g topically 4 (four) times daily.   dorzolamide -timolol  2-0.5 % ophthalmic solution Commonly known as: COSOPT  Place 1 drop into both eyes 2 (two) times daily.   famotidine  40 MG tablet Commonly known as: PEPCID  Take 1 tablet (40 mg total) by mouth daily.   fidaxomicin  200 MG Tabs tablet Commonly known as: DIFICID  Take 1 tablet (200 mg total) by mouth 2 (two) times daily for 4 days.   fluticasone  50 MCG/ACT nasal spray Commonly known as: FLONASE  Place 2 sprays into both nostrils daily.   metoprolol  tartrate 50 MG tablet Commonly known as: LOPRESSOR  Take 1 tablet (50 mg total) by mouth 2 (two) times daily.   metronidazole  375 MG capsule Commonly known as: Flagyl  Take 1 capsule (375 mg total) by mouth 2 (two) times daily for 4 days.   Multivitamin Women 50+ Tabs Take 1 tablet by mouth daily.   nitroGLYCERIN  0.4 MG SL tablet Commonly known as: NITROSTAT  Place 0.4 mg under the tongue every 5 (five) minutes as needed for chest pain.   ondansetron  4 MG disintegrating tablet Commonly known as: ZOFRAN -ODT Take 1 tablet (  4 mg total) by mouth every 4 (four) hours as needed.   PROBIOTIC PO Take 1 capsule by mouth daily.   Rocklatan  0.02-0.005 % Soln Generic drug: Netarsudil -Latanoprost  Apply 1 drop to eye at bedtime.   valsartan  160 MG tablet Commonly known as: Diovan  Take 1 tablet (160 mg total) by mouth at bedtime.   venlafaxine  XR 150 MG 24 hr capsule Commonly known as: EFFEXOR -XR Take 1 capsule (150 mg total) by mouth daily with breakfast.        Follow-up Information     [facility] Follow up.   Specialty: Home Health Services Why: Someone will call you to schedule resumption of care visit. Contact information: [address] [place] [address] [phone]         [facility] Follow up in 1 month(s).   Specialty: Vascular Surgery Why: The office will call you with your appointment Contact information: [address] [place]  [address] [phone]               Allergies[1]  Consultations: GI, vascular surgery   Procedures/Studies: PERIPHERAL VASCULAR CATHETERIZATION Result Date: 04/17/2024 Images from the original result were not included. Patient name: Cassandra Brooks MRN: [id number] DOB: 1941/04/06 Sex: female 04/17/2024 Pre-operative Diagnosis: Chronic mesenteric ischemia Post-operative diagnosis:  Same Surgeon:  Fonda FORBES Rim, MD Procedure Performed: 1.  Ultrasound-guided micropuncture access of the right common femoral artery retrograde fashion 2.  Aortogram 3.  Selective angiography of the superior mesenteric artery 4.  Mesenteric artery stenting 6 x 19 mm VBX 5.  Moderate sedation time 32 minutes, contrast volume 70 mL 6.  Access managed with Pro-glide device Indications: Patient is an 84 year old female presenting with persistent nausea and vomiting..  Also some element of postprandial pain.  Abdominal pain is 8 out of 10.  Abdominal pain has been persistent, and imaging demonstrates critical SMA stenosis.  After discussing risks and benefits of angiogram with possible intervention, and he elected to proceed Findings: Celiac artery, small, nondiseased Superior mesenteric artery with 95% stenosis at its ostia.  Replaced right hepatic artery appreciated.  Inferior mesenteric artery widely patent with arc of riolan arcade appreciated. Bilateral renal arteries patent Patent infrarenal aorta, patent bilateral common iliac arteries  Procedure:  The patient was identified in the holding area and taken to room 8.  The patient was then placed supine on the table and prepped and draped in the usual sterile fashion.  A time out was called.  Ultrasound was used to evaluate the right common  femoral artery.  It was patent .  A digital ultrasound image was acquired.  A micropuncture needle was used to access the right common femoral artery under ultrasound guidance.  An 018 wire was advanced without resistance and a micropuncture sheath was placed.  The 018 wire was removed and a benson wire was placed.  The micropuncture sheath was exchanged for a 5 french sheath.  An omniflush catheter was advanced over the wire to the level of the diaphragm.  Abdominal angiography followed.  See results above.  I elected to attempt intervention on the superior mesenteric artery. The patient was heparinized, and the sheath was exchanged for a 6.5 steerable sheath.  The sheath and Glidewire were used to cannulate the superior mesenteric artery.  Angiography of the superior mesenteric artery followed from a quick cross catheter to confirm true lumen.  This was  exchanged for a Rosen wire.  Over the [facility] wire a 6 x 19 mm Gore VBX was delivered and deployed in standard fashion.  Follow-up angiography demonstrated excellent result with resolution of flow-limiting stenosis.  Widely patent superior mesenteric artery, widely patent right hepatic artery. Patient was closed with a ProGlide device without issue. Impression: Successful superior mesenteric artery stenting using 6 x 19 mm Gore VBX. Fonda FORBES Rim MD [facility] Office: [phone]   CT ABDOMEN PELVIS W CONTRAST Result Date: 04/14/2024 CLINICAL DATA:  Abdominal pain, acute, nonlocalized UTI, recurrent/complicated (Female) EXAM: CT ABDOMEN AND PELVIS WITH CONTRAST TECHNIQUE: Multidetector CT imaging of the abdomen and pelvis was performed using the standard protocol following bolus administration of intravenous contrast. RADIATION DOSE REDUCTION: This exam was performed according to the departmental dose-optimization program which includes automated exposure control, adjustment of the mA and/or kV according to patient size and/or  use of iterative reconstruction technique. CONTRAST:  75mL OMNIPAQUE  IOHEXOL  350 MG/ML SOLN COMPARISON:  April 05, 2024 FINDINGS: Lower chest: Mild subpleural reticulation with some possible interlobular septal thickening. Hepatobiliary: No focal liver abnormality is seen. No gallstones, gallbladder wall thickening, or biliary dilatation. Pancreas: Unremarkable. No pancreatic ductal dilatation or surrounding inflammatory changes. Spleen: Normal in size without focal abnormality. Adrenals/Urinary Tract: Adrenal glands are unremarkable. There is persistent hypoenhancement of the superomedial pole of the RIGHT kidney. No hydronephrosis. 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 Scarring of the LEFT kidney. Excreted contrast within the renal collecting system obscures evaluation for nephrolithiasis. Recommend correlation with history of contrast administration timing. Bladder is unremarkable. Stomach/Bowel: No evidence of bowel obstruction. Moderate colonic stool burden. Status post appendectomy. Small hiatal hernia with circumferential wall thickening of the distal esophagus. Vascular/Lymphatic: Atherosclerotic calcifications of the nonaneurysmal abdominal aorta. Severe atherosclerotic calcifications at the origin of the SMA with a likely small area of poststenotic dilation. This results in likely severe stenosis. Flow is seen distally. No suspicious lymphadenopathy. Reproductive: Status post hysterectomy. No adnexal masses. Other: Postsurgical changes along the midline abdomen. No free air or free fluid. Musculoskeletal: Degenerative changes of the lumbar spine with retrolisthesis of L2-3. IMPRESSION: 1. There is persistent hypoenhancement of the superomedial pole of the RIGHT kidney. This is favored to reflect sequela of pyelonephritis. Recommend correlation with urinalysis. 2. Small hiatal hernia with circumferential wall thickening of the distal esophagus. This could reflect esophagitis. 3. Severe  atherosclerotic calcifications at the origin of the SMA with a small area of poststenotic dilation. 4. Mild subpleural reticulation with some possible interlobular septal thickening. This could reflect mild pulmonary edema superimposed on a background of mild interstitial lung disease. Aortic Atherosclerosis (ICD10-I70.0). Electronically Signed   By: Corean Salter M.D.   On: 04/14/2024 13:45   ECHOCARDIOGRAM COMPLETE Result Date: 04/08/2024    ECHOCARDIOGRAM REPORT   Patient Name:   Cassandra Brooks Date of Exam: 04/08/2024 Medical Rec #:  [id number]        Height:       61.0 in Accession #:    [id number]       Weight:       177.0 lb Date of Birth:  [birth year]        BSA:          1.793 m Patient Age:    83 years         BP:           149/63 mmHg Patient Gender: F  HR:           71 bpm. Exam Location:  Inpatient Procedure: 2D Echo, Cardiac Doppler and Color Doppler (Both Spectral and Color            Flow Doppler were utilized during procedure). Indications:    Chest Pain R07.9  History:        Patient has prior history of Echocardiogram examinations, most                 recent 08/04/2023. Risk Factors:Diabetes and Hypertension.  Sonographer:    Sydnee Wilson RDCS Referring Phys: [phone] STEPHEN K CHIU IMPRESSIONS  1. Left ventricular ejection fraction, by estimation, is 60 to 65%. The left ventricle has normal function. The left ventricle has no regional wall motion abnormalities. Left ventricular diastolic parameters are consistent with Grade I diastolic dysfunction (impaired relaxation).  2. Right ventricular systolic function is normal. The right ventricular size is normal. There is moderately elevated pulmonary artery systolic pressure. The estimated right ventricular systolic pressure is 47.1 mmHg.  3. Left atrial size was mildly dilated.  4. The mitral valve is normal in structure. Mild mitral valve regurgitation. No evidence of mitral stenosis.  5. The aortic valve is normal in structure.  Aortic valve regurgitation is not visualized. No aortic stenosis is present.  6. The inferior vena cava is normal in size with greater than 50% respiratory variability, suggesting right atrial pressure of 3 mmHg. FINDINGS  Left Ventricle: Left ventricular ejection fraction, by estimation, is 60 to 65%. The left ventricle has normal function. The left ventricle has no regional wall motion abnormalities. The left ventricular internal cavity size was normal in size. There is  no left ventricular hypertrophy. Left ventricular diastolic parameters are consistent with Grade I diastolic dysfunction (impaired relaxation). Right Ventricle: The right ventricular size is normal. No increase in right ventricular wall thickness. Right ventricular systolic function is normal. There is moderately elevated pulmonary artery systolic pressure. The tricuspid regurgitant velocity is 3.32 m/s, and with an assumed right atrial pressure of 3 mmHg, the estimated right ventricular systolic pressure is 47.1 mmHg. Left Atrium: Left atrial size was mildly dilated. Right Atrium: Right atrial size was normal in size. Pericardium: There is no evidence of pericardial effusion. Mitral Valve: The mitral valve is normal in structure. Mild mitral annular calcification. Mild mitral valve regurgitation. No evidence of mitral valve stenosis. Tricuspid Valve: The tricuspid valve is normal in structure. Tricuspid valve regurgitation is mild . No evidence of tricuspid stenosis. Aortic Valve: The aortic valve is normal in structure. Aortic valve regurgitation is not visualized. No aortic stenosis is present. Aortic valve mean gradient measures 3.0 mmHg. Aortic valve peak gradient measures 6.2 mmHg. Aortic valve area, by VTI measures 2.47 cm. Pulmonic Valve: The pulmonic valve was normal in structure. Pulmonic valve regurgitation is mild. No evidence of pulmonic stenosis. Aorta: The aortic root is normal in size and structure. Venous: The inferior vena cava  is normal in size with greater than 50% respiratory variability, suggesting right atrial pressure of 3 mmHg. IAS/Shunts: No atrial level shunt detected by color flow Doppler.  LEFT VENTRICLE PLAX 2D LVIDd:         3.80 cm     Diastology LVIDs:         2.50 cm     LV e' medial:    12.10 cm/s LV PW:         1.20 cm     LV E/e' medial:  9.1 LV  IVS:        0.90 cm     LV e' lateral:   12.50 cm/s LVOT diam:     2.00 cm     LV E/e' lateral: 8.8 LV SV:         78 LV SV Index:   44 LVOT Area:     3.14 cm  LV Volumes (MOD) LV vol d, MOD A2C: 69.0 ml LV vol d, MOD A4C: 69.2 ml LV vol s, MOD A2C: 20.0 ml LV vol s, MOD A4C: 28.8 ml LV SV MOD A2C:     49.0 ml LV SV MOD A4C:     69.2 ml LV SV MOD BP:      48.7 ml RIGHT VENTRICLE RV S prime:     15.90 cm/s TAPSE (M-mode): 2.6 cm LEFT ATRIUM             Index        RIGHT ATRIUM           Index LA diam:        4.00 cm 2.23 cm/m   RA Area:     12.40 cm LA Vol (A2C):   61.7 ml 34.41 ml/m  RA Volume:   26.40 ml  14.72 ml/m LA Vol (A4C):   50.3 ml 28.05 ml/m LA Biplane Vol: 60.6 ml 33.79 ml/m  AORTIC VALVE AV Area (Vmax):    2.45 cm AV Area (Vmean):   2.57 cm AV Area (VTI):     2.47 cm AV Vmax:           125.00 cm/s AV Vmean:          87.800 cm/s AV VTI:            0.317 m AV Peak Grad:      6.2 mmHg AV Mean Grad:      3.0 mmHg LVOT Vmax:         97.40 cm/s LVOT Vmean:        71.700 cm/s LVOT VTI:          0.249 m LVOT/AV VTI ratio: 0.79  AORTA Ao Root diam: 3.10 cm Ao Asc diam:  2.50 cm MITRAL VALVE                TRICUSPID VALVE MV Area (PHT): 2.86 cm     TR Peak grad:   44.1 mmHg MV Decel Time: 265 msec     TR Vmax:        332.00 cm/s MV E velocity: 110.00 cm/s MV A velocity: 51.90 cm/s   SHUNTS MV E/A ratio:  2.12         Systemic VTI:  0.25 m                             Systemic Diam: 2.00 cm Morene Brownie Electronically signed by Morene Brownie Signature Date/Time: 04/08/2024/6:10:21 PM    Final    DG Chest 2 View Result Date: 04/08/2024 EXAM: 2 VIEW(S) XRAY OF  THE CHEST 04/08/2024 03:43:41 AM COMPARISON: Portable chest dated 04/07/2024 06:23 PM. CLINICAL HISTORY: Chest pain, SOB (shortness of breath). FINDINGS: LUNGS AND PLEURA: There is interval clearing of the right infrahilar area. Patchy airspace disease persists in the left lower lung field. Small pleural effusions are unchanged. No pneumothorax. HEART AND MEDIASTINUM: There is cardiomegaly with perihilar vascular congestion and mild central edema, unchanged . The mediastinum is stable. Aortic atherosclerosis. BONES AND SOFT TISSUES:  No acute osseous abnormality. IMPRESSION: 1. Cardiomegaly with perihilar vascular congestion and mild central edema, unchanged . 2. Patchy airspace disease persists in the left lower lung field. 3. Small pleural effusions are unchanged. 4. Interval clearing of the right infrahilar area. Electronically signed by: Francis Quam MD 04/08/2024 04:11 AM EST RP Workstation: [id number]   DG CHEST PORT 1 VIEW Result Date: 04/07/2024 EXAM: 1 VIEW(S) XRAY OF THE CHEST 04/07/2024 06:26:00 PM COMPARISON: Chest x-ray 08/01/2022. CLINICAL HISTORY: Chest pain. FINDINGS: LUNGS AND PLEURA: Low lung volumes. Bilateral interstitial prominence with patchy airspace opacities of the right lower lung zone and possibly retrocardiac region. No pleural effusion. No pneumothorax. HEART AND MEDIASTINUM: Aortic atherosclerosis. No acute abnormality of the cardiac and mediastinal silhouettes. BONES AND SOFT TISSUES: No acute osseous abnormality. IMPRESSION: 1. Bilateral interstitial prominence with patchy airspace opacities in the right lower lung zone and possibly retrocardiac region. 2. Low lung volumes. 3. Consider repeat PA and lateral view of the chest with improved inspiratory effort for further evaluation. Electronically signed by: Morgane Naveau MD 04/07/2024 07:10 PM EST RP Workstation: [id number]   CT ABDOMEN PELVIS W CONTRAST Result Date: 04/05/2024 CLINICAL DATA:  Epigastric pain. EXAM: CT ABDOMEN  AND PELVIS WITH CONTRAST TECHNIQUE: Multidetector CT imaging of the abdomen and pelvis was performed using the standard protocol following bolus administration of intravenous contrast. RADIATION DOSE REDUCTION: This exam was performed according to the departmental dose-optimization program which includes automated exposure control, adjustment of the mA and/or kV according to patient size and/or use of iterative reconstruction technique. CONTRAST:  75mL OMNIPAQUE  IOHEXOL  350 MG/ML SOLN COMPARISON:  Fifteen days ago FINDINGS: Lower chest: No acute abnormality. Hepatobiliary: No focal liver abnormality is seen. No gallstones, gallbladder wall thickening, or biliary dilatation. Pancreas: Unremarkable. No pancreatic ductal dilatation or surrounding inflammatory changes. Spleen: Normal in size without focal abnormality. Adrenals/Urinary Tract: Adrenal glands appear normal. No hydronephrosis or renal obstruction is noted. Left renal cortical scarring is noted. No renal or ureteral calculi are noted. Bladder is unremarkable. There are persistent low density areas involving the right renal parenchyma on delayed post-contrast imaging suggesting some degree residual right-sided pyelonephritis. Stomach/Bowel: Stomach is unremarkable. There is no evidence of bowel obstruction or inflammation. Sigmoid diverticulosis without inflammation. Status post appendectomy. Vascular/Lymphatic: Aortic atherosclerosis. No enlarged abdominal or pelvic lymph nodes. Reproductive: Status post hysterectomy. No adnexal masses. Other: Stable round calcification seen in periumbilical region which may represent postsurgical change. No ascites is noted. Musculoskeletal: No acute or significant osseous findings. IMPRESSION: 1. Persistent low density areas are noted in right renal parenchyma on delayed post-contrast imaging suggesting some degree of residual right-sided pyelonephritis as noted on prior exam 15 days ago. 2. Sigmoid diverticulosis without  inflammation. 3. Aortic atherosclerosis. Aortic Atherosclerosis (ICD10-I70.0).  1 Electronically Signed   By: Lynwood Landy Raddle M.D.   On: 04/05/2024 09:59   MR LUMBAR SPINE WO CONTRAST Result Date: 04/05/2024 EXAM: MRI LUMBAR SPINE 04/05/2024 09:22:06 AM TECHNIQUE: Multiplanar multisequence MRI of the lumbar spine was performed without the administration of intravenous contrast. COMPARISON: MRI lumbar spine 07/17/2022. CLINICAL HISTORY: Low back pain, cauda equina syndrome suspected. FINDINGS: BONES AND ALIGNMENT: Conventional lumbosacral anatomy is assumed with 5 non-rib-bearing, lumbar-type vertebral bodies. Unchanged 4 mm retrolisthesis of L2 on L3. Normal vertebral body heights. Modic type 1 degenerative endplate marrow signal changes at L2-L3 and L3-L4. Unchanged postoperative appearance from prior L2-L3 laminectomy. SPINAL CORD: The conus terminates at L2. SOFT TISSUES: No paraspinal mass. L1-L2: Small disc bulge and mild bilateral facet  arthropathy without spinal canal stenosis or neural foraminal narrowing. L2-L3: Prior laminectomy. No spinal canal stenosis. Disc bulge and facet arthropathy contribute to moderate right neural foraminal narrowing. L3-L4: Prior laminectomy. No spinal canal stenosis. Disc bulge and facet arthropathy contribute to severe left and moderate right neural foraminal narrowing. L4-L5: Disc bulge and moderate bilateral facet arthropathy result in moderate narrowing of the bilateral subarticular zones, right greater than left with mass effect on the traversing bilateral L5 nerve roots. Moderate right neural foraminal narrowing. L5-S1: Left foraminal disc protrusion and facet arthropathy contribute to moderate left neural foraminal narrowing. No spinal canal stenosis. IMPRESSION: 1. Unchanged postoperative appearance from prior L2-3 and L3-4 laminectomies. 2. At L4-5, moderate bilateral subarticular zone narrowing (right greater than left) with mass effect on the traversing bilateral  L5 nerve roots, and moderate right neural foraminal narrowing. 3. Multilevel neural foraminal narrowing, worst at L3-4 with severe left and moderate right narrowing. Electronically signed by: Ryan Chess MD 04/05/2024 09:31 AM EST RP Workstation: [id number]   CT ABDOMEN PELVIS W CONTRAST Result Date: 03/21/2024 CLINICAL DATA:  Abdominal pain. EXAM: CT ABDOMEN AND PELVIS WITH CONTRAST TECHNIQUE: Multidetector CT imaging of the abdomen and pelvis was performed using the standard protocol following bolus administration of intravenous contrast. RADIATION DOSE REDUCTION: This exam was performed according to the departmental dose-optimization program which includes automated exposure control, adjustment of the mA and/or kV according to patient size and/or use of iterative reconstruction technique. CONTRAST:  75mL OMNIPAQUE  IOHEXOL  350 MG/ML SOLN COMPARISON:  CT abdomen pelvis dated 08/26/2013. FINDINGS: Lower chest: No acute abnormality. No intra-abdominal free air or free fluid. Hepatobiliary: The liver is unremarkable. No biliary dilatation. The gallbladder is unremarkable Pancreas: Unremarkable. No pancreatic ductal dilatation or surrounding inflammatory changes. Spleen: Normal in size without focal abnormality. Adrenals/Urinary Tract: The adrenal glands unremarkable there is no hydronephrosis on either side. Faint areas heterogeneous hypoenhancement of the upper pole of the right kidney concerning for pyelonephritis. Correlation with urinalysis recommended. No abscess. The visualized ureters and urinary bladder unremarkable. Stomach/Bowel: Residual food bolus noted in the distal esophagus. There is no bowel obstruction or active inflammation. Mild sigmoid diverticulosis. Appendectomy. Vascular/Lymphatic: Moderate aortoiliac atherosclerotic disease. The IVC is unremarkable. No portal venous gas. There is no adenopathy. Reproductive: Hysterectomy.  No suspicious adnexal masses. Other: A 2.3 x 2.3 cm partially  calcified lesion in the subcutaneous soft tissues of the anterior lower abdominal wall in the midline, likely sequela of post treatment changes or related to prior fat necrosis. Musculoskeletal: Osteopenia with degenerative changes. No acute osseous pathology. L2-L3 laminectomy. IMPRESSION: 1. Findings concerning for right-sided pyelonephritis. Correlation with urinalysis recommended. No abscess. 2. Sigmoid diverticulosis.  No bowel obstruction. 3.  Aortic Atherosclerosis (ICD10-I70.0). Electronically Signed   By: Vanetta Chou M.D.   On: 03/21/2024 19:17   CT L-SPINE NO CHARGE Result Date: 03/21/2024 CLINICAL DATA:  Back pain. EXAM: CT LUMBAR SPINE WITHOUT CONTRAST TECHNIQUE: Multidetector CT imaging of the lumbar spine was performed without intravenous contrast administration. Multiplanar CT image reconstructions were also generated. RADIATION DOSE REDUCTION: This exam was performed according to the departmental dose-optimization program which includes automated exposure control, adjustment of the mA and/or kV according to patient size and/or use of iterative reconstruction technique. COMPARISON:  CT abdomen pelvis dated 08/26/2013. FINDINGS: Segmentation: 5 lumbar type vertebrae. Alignment: No acute subluxation.  Grade 1 L2-L3 retrolisthesis. Vertebrae: No acute fracture. The bones are osteopenic. L2-L3 laminectomy. Paraspinal and other soft tissues: Negative. Disc levels: Extensive multilevel degenerative changes with disc  desiccation and vacuum phenomena. Multilevel disc space narrowing and endplate irregularity. IMPRESSION: 1. No acute/traumatic lumbar spine pathology. 2. Extensive multilevel degenerative changes. Electronically Signed   By: Vanetta Chou M.D.   On: 03/21/2024 18:58     Subjective: No acute issues or events overnight   Discharge Exam: Vitals:   04/18/24 0819 04/18/24 1155  BP: (!) 133/54 119/61  Pulse: 76 77  Resp: 18 18  Temp: 98.2 F (36.8 C) 98 F (36.7 C)  SpO2:  94% 99%   Vitals:   04/18/24 0420 04/18/24 0737 04/18/24 0819 04/18/24 1155  BP: (!) 111/41  (!) 133/54 119/61  Pulse: 65  76 77  Resp:   18 18  Temp: 98 F (36.7 C)  98.2 F (36.8 C) 98 F (36.7 C)  TempSrc: Oral  Oral Oral  SpO2: 93% 96% 94% 99%  Weight:      Height:        General: Pt is alert, awake, not in acute distress Cardiovascular: RRR, S1/S2 +, no rubs, no gallops Respiratory: CTA bilaterally, no wheezing, no rhonchi Abdominal: Soft, NT, ND, bowel sounds + Extremities: no edema, no cyanosis   The results of significant diagnostics from this hospitalization (including imaging, microbiology, ancillary and laboratory) are listed below for reference.     Microbiology: Recent Results (from the past 240 hours)  Urine Culture     Status: Abnormal   Collection Time: 04/14/24  1:09 PM   Specimen: Urine, Random  Result Value Ref Range Status   Specimen Description URINE, RANDOM  Final   Special Requests   Final    NONE Reflexed from [id number] Performed at [facility], [address] [place], [place] [address]    Culture (A)  Final    >=100,000 COLONIES/mL ESCHERICHIA COLI Confirmed Extended Spectrum Beta-Lactamase Producer (ESBL).  In bloodstream infections from ESBL organisms, carbapenems are preferred over piperacillin/tazobactam. They are shown to have a lower risk of mortality.    Report Status 04/16/2024 FINAL  Final   Organism ID, Bacteria ESCHERICHIA COLI (A)  Final      Susceptibility   Escherichia coli - MIC*    AMPICILLIN >=32 RESISTANT Resistant     CEFAZOLIN  (URINE) Value in next row Resistant      >=32 RESISTANTThis is a modified FDA-approved test that has been validated and its performance characteristics determined by the reporting laboratory.  This laboratory is certified under the Clinical Laboratory Improvement Amendments CLIA as qualified to perform high complexity clinical laboratory testing.    CEFEPIME Value in next row Resistant      >=32  RESISTANTThis is a modified FDA-approved test that has been validated and its performance characteristics determined by the reporting laboratory.  This laboratory is certified under the Clinical Laboratory Improvement Amendments CLIA as qualified to perform high complexity clinical laboratory testing.    ERTAPENEM Value in next row Sensitive      >=32 RESISTANTThis is a modified FDA-approved test that has been validated and its performance characteristics determined by the reporting laboratory.  This laboratory is certified under the Clinical Laboratory Improvement Amendments CLIA as qualified to perform high complexity clinical laboratory testing.    CEFTRIAXONE  Value in next row Resistant      >=32 RESISTANTThis is a modified FDA-approved test that has been validated and its performance characteristics determined by the reporting laboratory.  This laboratory is certified under the Clinical Laboratory Improvement Amendments CLIA as qualified to perform high complexity clinical laboratory testing.  CIPROFLOXACIN Value in next row Resistant      >=32 RESISTANTThis is a modified FDA-approved test that has been validated and its performance characteristics determined by the reporting laboratory.  This laboratory is certified under the Clinical Laboratory Improvement Amendments CLIA as qualified to perform high complexity clinical laboratory testing.    GENTAMICIN Value in next row Sensitive      >=32 RESISTANTThis is a modified FDA-approved test that has been validated and its performance characteristics determined by the reporting laboratory.  This laboratory is certified under the Clinical Laboratory Improvement Amendments CLIA as qualified to perform high complexity clinical laboratory testing.    NITROFURANTOIN Value in next row Sensitive      >=32 RESISTANTThis is a modified FDA-approved test that has been validated and its performance characteristics determined by the reporting laboratory.  This  laboratory is certified under the Clinical Laboratory Improvement Amendments CLIA as qualified to perform high complexity clinical laboratory testing.    TRIMETH/SULFA Value in next row Sensitive      >=32 RESISTANTThis is a modified FDA-approved test that has been validated and its performance characteristics determined by the reporting laboratory.  This laboratory is certified under the Clinical Laboratory Improvement Amendments CLIA as qualified to perform high complexity clinical laboratory testing.    AMPICILLIN/SULBACTAM Value in next row Sensitive      >=32 RESISTANTThis is a modified FDA-approved test that has been validated and its performance characteristics determined by the reporting laboratory.  This laboratory is certified under the Clinical Laboratory Improvement Amendments CLIA as qualified to perform high complexity clinical laboratory testing.    PIP/TAZO Value in next row Sensitive      <=4 SENSITIVEThis is a modified FDA-approved test that has been validated and its performance characteristics determined by the reporting laboratory.  This laboratory is certified under the Clinical Laboratory Improvement Amendments CLIA as qualified to perform high complexity clinical laboratory testing.    MEROPENEM Value in next row Sensitive      <=4 SENSITIVEThis is a modified FDA-approved test that has been validated and its performance characteristics determined by the reporting laboratory.  This laboratory is certified under the Clinical Laboratory Improvement Amendments CLIA as qualified to perform high complexity clinical laboratory testing.    * >=100,000 COLONIES/mL ESCHERICHIA COLI  Resp panel by RT-PCR (RSV, Flu A&B, Covid) Anterior Nasal Swab     Status: None   Collection Time: 04/14/24  3:29 PM   Specimen: Anterior Nasal Swab  Result Value Ref Range Status   SARS Coronavirus 2 by RT PCR NEGATIVE NEGATIVE Final   Influenza A by PCR NEGATIVE NEGATIVE Final   Influenza B by PCR NEGATIVE  NEGATIVE Final    Comment: (NOTE) The Xpert Xpress SARS-CoV-2/FLU/RSV plus assay is intended as an aid in the diagnosis of influenza from Nasopharyngeal swab specimens and should not be used as a sole basis for treatment. Nasal washings and aspirates are unacceptable for Xpert Xpress SARS-CoV-2/FLU/RSV testing.  Fact Sheet for Patients: bloggercourse.com  Fact Sheet for Healthcare Providers: seriousbroker.it  This test is not yet approved or cleared by the [place]  FDA and has been authorized for detection and/or diagnosis of SARS-CoV-2 by FDA under an Emergency Use Authorization (EUA). This EUA will remain in effect (meaning this test can be used) for the duration of the COVID-19 declaration under Section 564(b)(1) of the Act, 21 U.S.C. section 360bbb-3(b)(1), unless the authorization is terminated or revoked.     Resp Syncytial Virus by PCR NEGATIVE NEGATIVE Final  Comment: (NOTE) Fact Sheet for Patients: bloggercourse.com  Fact Sheet for Healthcare Providers: seriousbroker.it  This test is not yet approved or cleared by the [place]  FDA and has been authorized for detection and/or diagnosis of SARS-CoV-2 by FDA under an Emergency Use Authorization (EUA). This EUA will remain in effect (meaning this test can be used) for the duration of the COVID-19 declaration under Section 564(b)(1) of the Act, 21 U.S.C. section 360bbb-3(b)(1), unless the authorization is terminated or revoked.  Performed at [facility], [address], [place], [place] [address]   Gastrointestinal Panel by PCR , Stool     Status: None   Collection Time: 04/15/24  7:55 PM   Specimen: Stool  Result Value Ref Range Status   Campylobacter species NOT DETECTED NOT DETECTED Final   Plesimonas shigelloides NOT DETECTED NOT DETECTED Final   Salmonella species NOT DETECTED NOT DETECTED Final    Yersinia enterocolitica NOT DETECTED NOT DETECTED Final   Vibrio species NOT DETECTED NOT DETECTED Final   Vibrio cholerae NOT DETECTED NOT DETECTED Final   Enteroaggregative E coli (EAEC) NOT DETECTED NOT DETECTED Final   Enteropathogenic E coli (EPEC) NOT DETECTED NOT DETECTED Final   Enterotoxigenic E coli (ETEC) NOT DETECTED NOT DETECTED Final   Shiga like toxin producing E coli (STEC) NOT DETECTED NOT DETECTED Final   Shigella/Enteroinvasive E coli (EIEC) NOT DETECTED NOT DETECTED Final   Cryptosporidium NOT DETECTED NOT DETECTED Final   Cyclospora cayetanensis NOT DETECTED NOT DETECTED Final   Entamoeba histolytica NOT DETECTED NOT DETECTED Final   Giardia lamblia NOT DETECTED NOT DETECTED Final   Adenovirus F40/41 NOT DETECTED NOT DETECTED Final   Astrovirus NOT DETECTED NOT DETECTED Final   Norovirus GI/GII NOT DETECTED NOT DETECTED Final   Rotavirus A NOT DETECTED NOT DETECTED Final   Sapovirus (I, II, IV, and V) NOT DETECTED NOT DETECTED Final    Comment: Performed at [facility], [address] [place], [place] [address]     Labs: BNP (last 3 results) No results for input(s): BNP in the last 8760 hours. Basic Metabolic Panel: Recent Labs  Lab 04/14/24 0951 04/15/24 0433 04/17/24 0313 04/18/24 0526  NA 138 135 138 141  K 4.1 4.5 3.5 4.0  CL 98 102 105 111  CO2 27 22 21* 21*  GLUCOSE 150* 133* 128* 105*  BUN 27* 20 23 21   CREATININE 1.09* 0.99 1.14* 1.05*  CALCIUM  9.7 8.9 8.4* 7.9*   Liver Function Tests: Recent Labs  Lab 04/14/24 0951 04/15/24 0433  AST 21 22  ALT 18 15  ALKPHOS 93 79  BILITOT 0.4 0.5  PROT 7.6 6.7  ALBUMIN  3.8 3.4*   Recent Labs  Lab 04/14/24 1143  LIPASE 32   No results for input(s): AMMONIA in the last 168 hours. CBC: Recent Labs  Lab 04/14/24 0951 04/15/24 0433 04/17/24 0313 04/18/24 0526  WBC 9.9 9.5 6.8 5.2  NEUTROABS 7.4  --  3.6 3.0  HGB 14.2 13.0 10.9* 10.7*  HCT 42.2 39.0 32.5* 32.4*  MCV  106.8* 105.7* 104.8* 108.0*  PLT 205 165 166 149*   Cardiac Enzymes: No results for input(s): CKTOTAL, CKMB, CKMBINDEX, TROPONINI in the last 168 hours. BNP: Invalid input(s): POCBNP CBG: No results for input(s): GLUCAP in the last 168 hours. D-Dimer No results for input(s): DDIMER in the last 72 hours. Hgb A1c No results for input(s): HGBA1C in the last 72 hours. Lipid Profile Recent Labs    04/18/24 0526  CHOL  64  HDL 35*  LDLCALC 12  TRIG 83  CHOLHDL 1.8   Thyroid  function studies No results for input(s): TSH, T4TOTAL, T3FREE, THYROIDAB in the last 72 hours.  Invalid input(s): FREET3 Anemia work up No results for input(s): VITAMINB12, FOLATE, FERRITIN, TIBC, IRON, RETICCTPCT in the last 72 hours. Urinalysis    Component Value Date/Time   COLORURINE YELLOW 04/14/2024 1309   APPEARANCEUR CLOUDY (A) 04/14/2024 1309   LABSPEC 1.011 04/14/2024 1309   PHURINE 7.0 04/14/2024 1309   GLUCOSEU NEGATIVE 04/14/2024 1309   HGBUR NEGATIVE 04/14/2024 1309   BILIRUBINUR NEGATIVE 04/14/2024 1309   KETONESUR NEGATIVE 04/14/2024 1309   PROTEINUR NEGATIVE 04/14/2024 1309   NITRITE NEGATIVE 04/14/2024 1309   LEUKOCYTESUR LARGE (A) 04/14/2024 1309   Sepsis Labs Recent Labs  Lab 04/14/24 0951 04/15/24 0433 04/17/24 0313 04/18/24 0526  WBC 9.9 9.5 6.8 5.2   Microbiology Recent Results (from the past 240 hours)  Urine Culture     Status: Abnormal   Collection Time: 04/14/24  1:09 PM   Specimen: Urine, Random  Result Value Ref Range Status   Specimen Description URINE, RANDOM  Final   Special Requests   Final    NONE Reflexed from [id number] Performed at [facility], [address] [place], [place] [address]    Culture (A)  Final    >=100,000 COLONIES/mL ESCHERICHIA COLI Confirmed Extended Spectrum Beta-Lactamase Producer (ESBL).  In bloodstream infections from ESBL organisms, carbapenems are preferred over piperacillin/tazobactam.  They are shown to have a lower risk of mortality.    Report Status 04/16/2024 FINAL  Final   Organism ID, Bacteria ESCHERICHIA COLI (A)  Final      Susceptibility   Escherichia coli - MIC*    AMPICILLIN >=32 RESISTANT Resistant     CEFAZOLIN  (URINE) Value in next row Resistant      >=32 RESISTANTThis is a modified FDA-approved test that has been validated and its performance characteristics determined by the reporting laboratory.  This laboratory is certified under the Clinical Laboratory Improvement Amendments CLIA as qualified to perform high complexity clinical laboratory testing.    CEFEPIME Value in next row Resistant      >=32 RESISTANTThis is a modified FDA-approved test that has been validated and its performance characteristics determined by the reporting laboratory.  This laboratory is certified under the Clinical Laboratory Improvement Amendments CLIA as qualified to perform high complexity clinical laboratory testing.    ERTAPENEM Value in next row Sensitive      >=32 RESISTANTThis is a modified FDA-approved test that has been validated and its performance characteristics determined by the reporting laboratory.  This laboratory is certified under the Clinical Laboratory Improvement Amendments CLIA as qualified to perform high complexity clinical laboratory testing.    CEFTRIAXONE  Value in next row Resistant      >=32 RESISTANTThis is a modified FDA-approved test that has been validated and its performance characteristics determined by the reporting laboratory.  This laboratory is certified under the Clinical Laboratory Improvement Amendments CLIA as qualified to perform high complexity clinical laboratory testing.    CIPROFLOXACIN Value in next row Resistant      >=32 RESISTANTThis is a modified FDA-approved test that has been validated and its performance characteristics determined by the reporting laboratory.  This laboratory is certified under the Clinical Laboratory Improvement  Amendments CLIA as qualified to perform high complexity clinical laboratory testing.    GENTAMICIN Value in next row Sensitive      >=32 RESISTANTThis is a  modified FDA-approved test that has been validated and its performance characteristics determined by the reporting laboratory.  This laboratory is certified under the Clinical Laboratory Improvement Amendments CLIA as qualified to perform high complexity clinical laboratory testing.    NITROFURANTOIN Value in next row Sensitive      >=32 RESISTANTThis is a modified FDA-approved test that has been validated and its performance characteristics determined by the reporting laboratory.  This laboratory is certified under the Clinical Laboratory Improvement Amendments CLIA as qualified to perform high complexity clinical laboratory testing.    TRIMETH/SULFA Value in next row Sensitive      >=32 RESISTANTThis is a modified FDA-approved test that has been validated and its performance characteristics determined by the reporting laboratory.  This laboratory is certified under the Clinical Laboratory Improvement Amendments CLIA as qualified to perform high complexity clinical laboratory testing.    AMPICILLIN/SULBACTAM Value in next row Sensitive      >=32 RESISTANTThis is a modified FDA-approved test that has been validated and its performance characteristics determined by the reporting laboratory.  This laboratory is certified under the Clinical Laboratory Improvement Amendments CLIA as qualified to perform high complexity clinical laboratory testing.    PIP/TAZO Value in next row Sensitive      <=4 SENSITIVEThis is a modified FDA-approved test that has been validated and its performance characteristics determined by the reporting laboratory.  This laboratory is certified under the Clinical Laboratory Improvement Amendments CLIA as qualified to perform high complexity clinical laboratory testing.    MEROPENEM Value in next row Sensitive      <=4 SENSITIVEThis  is a modified FDA-approved test that has been validated and its performance characteristics determined by the reporting laboratory.  This laboratory is certified under the Clinical Laboratory Improvement Amendments CLIA as qualified to perform high complexity clinical laboratory testing.    * >=100,000 COLONIES/mL ESCHERICHIA COLI  Resp panel by RT-PCR (RSV, Flu A&B, Covid) Anterior Nasal Swab     Status: None   Collection Time: 04/14/24  3:29 PM   Specimen: Anterior Nasal Swab  Result Value Ref Range Status   SARS Coronavirus 2 by RT PCR NEGATIVE NEGATIVE Final   Influenza A by PCR NEGATIVE NEGATIVE Final   Influenza B by PCR NEGATIVE NEGATIVE Final    Comment: (NOTE) The Xpert Xpress SARS-CoV-2/FLU/RSV plus assay is intended as an aid in the diagnosis of influenza from Nasopharyngeal swab specimens and should not be used as a sole basis for treatment. Nasal washings and aspirates are unacceptable for Xpert Xpress SARS-CoV-2/FLU/RSV testing.  Fact Sheet for Patients: bloggercourse.com  Fact Sheet for Healthcare Providers: seriousbroker.it  This test is not yet approved or cleared by the [place]  FDA and has been authorized for detection and/or diagnosis of SARS-CoV-2 by FDA under an Emergency Use Authorization (EUA). This EUA will remain in effect (meaning this test can be used) for the duration of the COVID-19 declaration under Section 564(b)(1) of the Act, 21 U.S.C. section 360bbb-3(b)(1), unless the authorization is terminated or revoked.     Resp Syncytial Virus by PCR NEGATIVE NEGATIVE Final    Comment: (NOTE) Fact Sheet for Patients: bloggercourse.com  Fact Sheet for Healthcare Providers: seriousbroker.it  This test is not yet approved or cleared by the [place]  FDA and has been authorized for detection and/or diagnosis of SARS-CoV-2 by FDA under an Emergency Use  Authorization (EUA). This EUA will remain in effect (meaning this test can be used) for the duration of the COVID-19 declaration under Section 564(b)(1)  of the Act, 21 U.S.C. section 360bbb-3(b)(1), unless the authorization is terminated or revoked.  Performed at [facility], [address] [place], [place] [address]   Gastrointestinal Panel by PCR , Stool     Status: None   Collection Time: 04/15/24  7:55 PM   Specimen: Stool  Result Value Ref Range Status   Campylobacter species NOT DETECTED NOT DETECTED Final   Plesimonas shigelloides NOT DETECTED NOT DETECTED Final   Salmonella species NOT DETECTED NOT DETECTED Final   Yersinia enterocolitica NOT DETECTED NOT DETECTED Final   Vibrio species NOT DETECTED NOT DETECTED Final   Vibrio cholerae NOT DETECTED NOT DETECTED Final   Enteroaggregative E coli (EAEC) NOT DETECTED NOT DETECTED Final   Enteropathogenic E coli (EPEC) NOT DETECTED NOT DETECTED Final   Enterotoxigenic E coli (ETEC) NOT DETECTED NOT DETECTED Final   Shiga like toxin producing E coli (STEC) NOT DETECTED NOT DETECTED Final   Shigella/Enteroinvasive E coli (EIEC) NOT DETECTED NOT DETECTED Final   Cryptosporidium NOT DETECTED NOT DETECTED Final   Cyclospora cayetanensis NOT DETECTED NOT DETECTED Final   Entamoeba histolytica NOT DETECTED NOT DETECTED Final   Giardia lamblia NOT DETECTED NOT DETECTED Final   Adenovirus F40/41 NOT DETECTED NOT DETECTED Final   Astrovirus NOT DETECTED NOT DETECTED Final   Norovirus GI/GII NOT DETECTED NOT DETECTED Final   Rotavirus A NOT DETECTED NOT DETECTED Final   Sapovirus (I, II, IV, and V) NOT DETECTED NOT DETECTED Final    Comment: Performed at [facility], [address] [place], [place] [address]     Time coordinating discharge: Over 30 minutes  SIGNED:   Elsie JAYSON Montclair, DO [facility] 04/18/2024, 1:43 PM Pager   If 7PM-7AM, please contact night-coverage [email or web address]     [1]   Allergies Allergen Reactions   Zestril  [Lisinopril ] Cough

## 2024-04-18 NOTE — TOC CM/SW Note (Signed)
 1448 04-18-24 Patient was transferred to 6 E. Plan for discharge home today. Previous Case Manager states patient is active with [facility]. Patient states she is active with PT and feels that she can benefit from [facility] RN/OT/Aide. Orders and F2F will be placed in EPIC by the MD. [facility] is aware of additional disciplines. Patient states her spouse will provide transportation home. No further needs identified at this time.

## 2024-04-18 NOTE — Progress Notes (Signed)
 PHARMACIST LIPID MONITORING   Cassandra Brooks is a 84 y.o. female admitted on 04/14/2024 with N/V.  Pharmacy has been consulted to optimize lipid-lowering therapy with the indication of secondary prevention for clinical ASCVD.  Recent Labs:  Lipid Panel (last 6 months):   Lab Results  Component Value Date   CHOL 64 04/18/2024   TRIG 83 04/18/2024   HDL 35 (L) 04/18/2024   CHOLHDL 1.8 04/18/2024   VLDL 17 04/18/2024   LDLCALC 12 04/18/2024    Hepatic function panel (last 6 months):   Lab Results  Component Value Date   AST 22 04/15/2024   ALT 15 04/15/2024   ALKPHOS 79 04/15/2024   BILITOT 0.5 04/15/2024    SCr (since admission):   Serum creatinine: 1.05 mg/dL (H) 98/91/73 9473 Estimated creatinine clearance: 37.2 mL/min (A)  Current therapy and lipid therapy tolerance Current lipid-lowering therapy: atorvastatin  80 mg Previous lipid-lowering therapies (if applicable):  Documented or reported allergies or intolerances to lipid-lowering therapies (if applicable): none  Assessment:   LOL low, no changes needed.   Plan:    1.Statin intensity (high intensity recommended for all patients regardless of the LDL):  No statin changes. The patient is already on a high intensity statin.  2.Add ezetimibe (if any one of the following):   Not indicated at this time.  3.Refer to lipid clinic:   No  4.Follow-up with:  Primary care provider - Moon, Amy A, NP  5.Follow-up labs after discharge:  No changes in lipid therapy, repeat a lipid panel in one year.       Harlene Barlow, Berdine [employer]ONETTA [employer], BCCP Clinical Pharmacist  04/18/2024 9:49 AM   [facility] pharmacy phone numbers are listed on [email or web address]

## 2024-04-18 NOTE — Plan of Care (Signed)
  Problem: Activity: Goal: Risk for activity intolerance will decrease Outcome: Progressing   Problem: Pain Managment: Goal: General experience of comfort will improve and/or be controlled Outcome: Progressing   Problem: Safety: Goal: Ability to remain free from injury will improve Outcome: Progressing   Problem: Skin Integrity: Goal: Risk for impaired skin integrity will decrease Outcome: Progressing

## 2024-04-18 NOTE — Discharge Instructions (Signed)
  [facility]  Discharge Instructions  Angiogram; Angioplasty/Stenting  Please refer to the following instructions for your post-procedure care. Your surgeon or physician assistant will discuss any changes with you.  Activity  Avoid lifting more than 8 pounds (1 gallons of milk) for 5 days after your procedure. You may walk as much as you can tolerate. It's OK to drive after 72 hours.  Bathing/Showering  You may shower the day after your procedure. If you have a bandage, you may remove it at 24- 48 hours. Clean your incision site with mild soap and water. Pat the area dry with a clean towel.  Diet  Resume your pre-procedure diet. There are no special food restrictions following this procedure. All patients with peripheral vascular disease should follow a low fat/low cholesterol diet. In order to heal from your surgery, it is CRITICAL to get adequate nutrition. Your body requires vitamins, minerals, and protein. Vegetables are the best source of vitamins and minerals. Vegetables also provide the perfect balance of protein. Processed food has little nutritional value, so try to avoid this.  Medications  Resume taking all of your medications unless your doctor tells you not to. If your incision is causing pain, you may take over-the-counter pain relievers such as acetaminophen (Tylenol)  Follow Up  Follow up will be arranged at the time of your procedure. You may have an office visit scheduled or may be scheduled for surgery. Ask your surgeon if you have any questions.  Please call us immediately for any of the following conditions: .Severe or worsening pain your legs or feet at rest or with walking. .Increased pain, redness, drainage at your groin puncture site. .Fever of 101 degrees or higher. .If you have any mild or slow bleeding from your puncture site: lie down, apply firm constant pressure over the area with a piece of gauze or a clean wash cloth  for 30 minutes- no peeking!, call 911 right away if you are still bleeding after 30 minutes, or if the bleeding is heavy and unmanageable.  Reduce your risk factors of vascular disease:  . Stop smoking. If you would like help call [facility] at 1-800-QUIT-NOW (443)220-7888) or [place] at 803-647-4961. . Manage your cholesterol . Maintain a desired weight . Control your diabetes . Keep your blood pressure down .  If you have any questions, please call the office at [phone]

## 2024-04-18 NOTE — Progress Notes (Signed)
 Physical Therapy Treatment Patient Details Name: Cassandra Brooks MRN: [id number] DOB: [birth year] Today's Date: 04/18/2024   History of Present Illness Pt is an 84 y/o female who presents 04/14/2024 with continued N/V after 2 recent admissions for the same. Pt was treated for pyelonephritis, E. Coli and C. Diff in previous admissions. PMH significant for CAD, HTN, TIA, AAA, STEMI, syncope, L TKA, lumbar spinal stenosis, DDD, fibromyalgia, glaucoma, depression, chronic fecal and urinary incontinence.    PT Comments  Pt resting in bed on arrival, pleasant and agreeable to session and demonstrating steady progress towards acute goals. Pt continues to be limited in safe mobility by decreased activity tolerance and global weakness with pt requiring up to CGA during gait for safety. Pt performing transfers sit<>stand and room level ambulation with RW for support with up to CGA for safety. Pt agreeable to time up in chair at end of session. Pt continues to benefit from skilled PT services to progress toward functional mobility goals.     If plan is discharge home, recommend the following: Assistance with cooking/housework;Assist for transportation;Help with stairs or ramp for entrance;A little help with walking and/or transfers;A little help with bathing/dressing/bathroom   Can travel by private vehicle        Equipment Recommendations  None recommended by PT    Recommendations for Other Services       Precautions / Restrictions Precautions Precautions: Fall Recall of Precautions/Restrictions: Intact Precaution/Restrictions Comments: bowel urgency (wears depends) Restrictions Weight Bearing Restrictions Per Provider Order: No     Mobility  Bed Mobility Overal bed mobility: Needs Assistance Bed Mobility: Supine to Sit     Supine to sit: Supervision     General bed mobility comments: supervision for safety, pt sitting up to L EOB with HOB flat    Transfers Overall transfer level: Needs  assistance Equipment used: Rolling walker (2 wheels) Transfers: Sit to/from Stand Sit to Stand: Supervision           General transfer comment: Light supervision for safety. Able to stand without RW    Ambulation/Gait Ambulation/Gait assistance: Supervision Gait Distance (Feet): 65 Feet Assistive device: Rolling walker (2 wheels) Gait Pattern/deviations: Step-through pattern, Decreased stride length, Trunk flexed Gait velocity: Decreased     General Gait Details: Around room, Pt with intermittent CGA to supervision for safety. Navigates around room well.   Stairs             Wheelchair Mobility     Tilt Bed    Modified Rankin (Stroke Patients Only)       Balance Overall balance assessment: Needs assistance Sitting-balance support: No upper extremity supported, Feet supported Sitting balance-Leahy Scale: Good Sitting balance - Comments: good seated balance seated at EOB   Standing balance support: Reliant on assistive device for balance Standing balance-Leahy Scale: Fair Standing balance comment: relies on at least 1 UE support                            Communication Communication Communication: Impaired Factors Affecting Communication: Difficulty expressing self (some difficulty with word finding noted)  Cognition Arousal: Alert Behavior During Therapy: WFL for tasks assessed/performed   PT - Cognitive impairments: No apparent impairments                         Following commands: Intact      Cueing Cueing Techniques: Verbal cues  Exercises      General  Comments General comments (skin integrity, edema, etc.): VSS on RA      Pertinent Vitals/Pain Pain Assessment Pain Assessment: Faces Faces Pain Scale: Hurts a little bit Pain Location: groin Pain Descriptors / Indicators: Sore Pain Intervention(s): Monitored during session, Limited activity within patient's tolerance    Home Living                           Prior Function            PT Goals (current goals can now be found in the care plan section) Acute Rehab PT Goals Patient Stated Goal: return home PT Goal Formulation: With patient Time For Goal Achievement: 04/22/24 Progress towards PT goals: Progressing toward goals    Frequency    Min 1X/week      PT Plan      Co-evaluation              AM-PAC PT 6 Clicks Mobility   Outcome Measure  Help needed turning from your back to your side while in a flat bed without using bedrails?: None Help needed moving from lying on your back to sitting on the side of a flat bed without using bedrails?: None Help needed moving to and from a bed to a chair (including a wheelchair)?: A Little Help needed standing up from a chair using your arms (e.g., wheelchair or bedside chair)?: A Little Help needed to walk in hospital room?: A Little Help needed climbing 3-5 steps with a railing? : A Little 6 Click Score: 20    End of Session Equipment Utilized During Treatment: Gait belt Activity Tolerance: Patient tolerated treatment well Patient left: in chair;with call bell/phone within reach Nurse Communication: Mobility status PT Visit Diagnosis: Other abnormalities of gait and mobility (R26.89)     Time: 8899-8878 PT Time Calculation (min) (ACUTE ONLY): 21 min  Charges:    $Gait Training: 8-22 mins PT General Charges $$ ACUTE PT VISIT: 1 Visit                     Therisa R. PTA Acute Rehabilitation Services Office: [phone]   Therisa CHRISTELLA Boor 04/18/2024, 11:23 AM

## 2024-04-18 NOTE — Progress Notes (Addendum)
" °  Progress Note    04/18/2024 10:19 AM 1 Day Post-Op  Subjective:  tolerated her dinner last night. Says she did not have any nausea. Feels pain is about the same    Vitals:   04/18/24 0737 04/18/24 0819  BP:  (!) 133/54  Pulse:  76  Resp:  18  Temp:  98.2 F (36.8 C)  SpO2: 96% 94%   Physical Exam: Cardiac:  regular Lungs:  non labored Extremities:  right CF access site without swelling or hematoma Abdomen:  soft, non tender, non distended Neurologic: alert and oriented   CBC    Component Value Date/Time   WBC 5.2 04/18/2024 0526   RBC 3.00 (L) 04/18/2024 0526   HGB 10.7 (L) 04/18/2024 0526   HCT 32.4 (L) 04/18/2024 0526   HCT 42.3 03/22/2017 0336   PLT 149 (L) 04/18/2024 0526   MCV 108.0 (H) 04/18/2024 0526   MCH 35.7 (H) 04/18/2024 0526   MCHC 33.0 04/18/2024 0526   RDW 14.0 04/18/2024 0526   LYMPHSABS 1.4 04/18/2024 0526   MONOABS 0.5 04/18/2024 0526   EOSABS 0.2 04/18/2024 0526   BASOSABS 0.0 04/18/2024 0526    BMET    Component Value Date/Time   NA 141 04/18/2024 0526   NA 141 11/23/2022 0854   K 4.0 04/18/2024 0526   CL 111 04/18/2024 0526   CO2 21 (L) 04/18/2024 0526   GLUCOSE 105 (H) 04/18/2024 0526   BUN 21 04/18/2024 0526   BUN 28 (H) 11/23/2022 0854   CREATININE 1.05 (H) 04/18/2024 0526   CALCIUM  7.9 (L) 04/18/2024 0526   GFRNONAA 52 (L) 04/18/2024 0526   GFRAA 54 (L) 08/23/2019 1747    INR    Component Value Date/Time   INR 1.1 04/05/2024 1443     Intake/Output Summary (Last 24 hours) at 04/18/2024 1019 Last data filed at 04/17/2024 1550 Gross per 24 hour  Intake 2.23 ml  Output --  Net 2.23 ml     Assessment/Plan:  84 y.o. female is s/p Aortogram, mesenteric angiogram, SMA stenting 1 Day Post-Op   Successful stenting of SMA Right CF access site without swelling or hematoma Improved nausea but feels pain is about the same post prandial Advised patient that it can take some time for pain to improve. Recommend she eat slowly and  not to heavy of meals  Continue Aspirin , Statin, Plavix   Will arrange follow up in 1 month with mesenteric duplex  Teretha Damme, PA-C [facility] [phone] 04/18/2024 10:19 AM  VASCULAR STAFF ADDENDUM: I have independently interviewed and examined the patient. I agree with the above.   Norman GORMAN Serve MD [facility] Phone Number: [phone] 04/18/2024 12:17 PM  "

## 2024-04-21 ENCOUNTER — Ambulatory Visit: Payer: Self-pay | Admitting: Gastroenterology

## 2024-04-21 NOTE — Progress Notes (Signed)
 Ms. Cotterman,  The biopsies taken from your stomach were notable for mild reactive gastropathy which is a common finding and often related to use of certain medications (usually NSAIDs), but there was no evidence of Helicobacter pylori infection. This common finding is not felt to necessarily be a cause of any particular symptom and there is no specific treatment or further evaluation recommended.  The biopsies of your esophagus showed nonspecific inflammatory changes but there was no evidence of infection.  This is most likely from acid reflux.  Please continue to take the the pantoprazole  as recommended.  Follow up with Dr. Charlanne in the office for ongoing management of your chronic diarrhea and nausea/vomiting.

## 2024-04-25 ENCOUNTER — Telehealth: Payer: Self-pay | Admitting: Physical Medicine and Rehabilitation

## 2024-04-25 NOTE — Telephone Encounter (Signed)
 Pt's husband called wanting to make an apt for his wife's MRI review. Call back number is [phone].

## 2024-05-01 ENCOUNTER — Other Ambulatory Visit: Payer: Self-pay | Admitting: Physical Medicine and Rehabilitation

## 2024-05-01 ENCOUNTER — Ambulatory Visit: Admitting: Physical Medicine and Rehabilitation

## 2024-05-01 DIAGNOSIS — M5416 Radiculopathy, lumbar region: Secondary | ICD-10-CM

## 2024-05-01 DIAGNOSIS — M48061 Spinal stenosis, lumbar region without neurogenic claudication: Secondary | ICD-10-CM

## 2024-05-01 DIAGNOSIS — M5441 Lumbago with sciatica, right side: Secondary | ICD-10-CM

## 2024-05-01 DIAGNOSIS — M797 Fibromyalgia: Secondary | ICD-10-CM | POA: Diagnosis not present

## 2024-05-01 DIAGNOSIS — G8929 Other chronic pain: Secondary | ICD-10-CM

## 2024-05-01 DIAGNOSIS — M5442 Lumbago with sciatica, left side: Secondary | ICD-10-CM

## 2024-05-01 NOTE — Progress Notes (Signed)
 Pain Scale   Average Pain 8 Patient advised she also chronic lower back pain that is constant. Patient here for MRI review.        +Driver, -BT, -Dye Allergies.

## 2024-05-01 NOTE — Progress Notes (Signed)
 "  Cassandra Brooks - 84 y.o. female MRN [id number]  Date of birth: 08-Feb-1941  Office Visit Note: Visit Date: 05/01/2024 PCP: Erick Greig LABOR, NP Referred by: Erick Greig LABOR, NP  Subjective: Chief Complaint  Patient presents with   Lower Back - Pain   HPI: Cassandra Brooks is a 84 y.o. female who comes in today for evaluation of chronic, worsening and severe bilateral lower back pain radiating to buttocks. Patient is here today for lumbar MRI review. Pain ongoing for several years. She is somewhat of poor historian, husband accompanying her during our visit today. Her pain worsens with prolonged standing and walking. States her pain is most severe in the morning after waking up. She describes pain as sore and aching sensation, currently rates as 8 out of 10. Some relief of pain with home exercise regimen, rest and use of medications. History of physical therapy at [facility] with minimal relief of pain. Recent lumbar MRI imaging shows unchanged postoperative appearance from prior L2-L3 and L3-L4 laminectomies. disc bulge and moderate bilateral facet arthropathy result in moderate narrowing of the bilateral subarticular zones, right greater than left with mass effect on the traversing bilateral L5 nerve roots. Bilateral foraminal narrowing at L3-L4. History of laminectomy decompression of L2-L3 and L3-L4 with Dr. Lynwood Better on 04/26/2021. Most recent injection was bilateral L4-L5 and L5-S1 facet joint injections in our office in 2023. She reports minimal relief of pain with this procedure. Intermittent relief of pain with prior lumbar epidural steroid injections. Patient denies focal weakness, numbness and tingling. No recent trauma or falls. Currently using cane to assist with ambulation.  Of note, patient reports recent complications with abdominal pain, nausea and vomiting. She was recently hospitalized and underwent visceral angiography with stent placement. She is recovering without significant issues.  She does carry diagnosis of fibromyalgia.       Review of Systems  Musculoskeletal:  Positive for back pain.  Neurological:  Negative for tingling, sensory change, focal weakness and weakness.  All other systems reviewed and are negative.  Otherwise per HPI.  Assessment & Plan: Visit Diagnoses:    ICD-10-CM   1. Chronic bilateral low back pain with bilateral sciatica  M54.42 Ambulatory referral to Physical Medicine Rehab   M54.41    G89.29     2. Lumbar radiculopathy  M54.16 Ambulatory referral to Physical Medicine Rehab    3. Bilateral stenosis of lateral recess of lumbar spine  M48.061 Ambulatory referral to Physical Medicine Rehab    4. Fibromyalgia  M79.7 Ambulatory referral to Physical Medicine Rehab       Plan: Findings:  Chronic, worsening and severe bilateral lower back pain radiating to buttocks. Patient continues to have severe pain despite good conservative therapies such as formal physical therapy, home exercise regimen, rest and use of medications. I discussed recent lumbar MRI with patient today using imaging and spine model. Patients clinical presentation and exam are consistent with lumbar radiculopathy. There is lateral recess stenosis and central stenosis at the level of L4-L5 that could be pain generator. We discussed treatment plan in detail today. Next step is to perform diagnostic and hopefully therapeutic L4-L5 interlaminar epidural steroid injection under fluoroscopic guidance. She is currently taking Plavix , will need permission for her to discontinue this medication prior to injection. If good relief of pain with injection we can repeat this procedure infrequently as needed. Patient has no questions at this time. Her exam today is non focal, good strength noted to bilateral lower extremities.  Meds & Orders: No orders of the defined types were placed in this encounter.   Orders Placed This Encounter  Procedures   Ambulatory referral to Physical Medicine  Rehab    Follow-up: Return for L4-L5 interlaminar epidural steroid injection.   Procedures: No procedures performed      Clinical History: Narrative & Impression EXAM: MRI LUMBAR SPINE 04/05/2024 09:22:06 AM   TECHNIQUE: Multiplanar multisequence MRI of the lumbar spine was performed without the administration of intravenous contrast.   COMPARISON: MRI lumbar spine 07/17/2022.   CLINICAL HISTORY: Low back pain, cauda equina syndrome suspected.   FINDINGS:   BONES AND ALIGNMENT: Conventional lumbosacral anatomy is assumed with 5 non-rib-bearing, lumbar-type vertebral bodies. Unchanged 4 mm retrolisthesis of L2 on L3. Normal vertebral body heights. Modic type 1 degenerative endplate marrow signal changes at L2-L3 and L3-L4. Unchanged postoperative appearance from prior L2-L3 laminectomy.   SPINAL CORD: The conus terminates at L2.   SOFT TISSUES: No paraspinal mass.   L1-L2: Small disc bulge and mild bilateral facet arthropathy without spinal canal stenosis or neural foraminal narrowing.   L2-L3: Prior laminectomy. No spinal canal stenosis. Disc bulge and facet arthropathy contribute to moderate right neural foraminal narrowing.   L3-L4: Prior laminectomy. No spinal canal stenosis. Disc bulge and facet arthropathy contribute to severe left and moderate right neural foraminal narrowing.   L4-L5: Disc bulge and moderate bilateral facet arthropathy result in moderate narrowing of the bilateral subarticular zones, right greater than left with mass effect on the traversing bilateral L5 nerve roots. Moderate right neural foraminal narrowing.   L5-S1: Left foraminal disc protrusion and facet arthropathy contribute to moderate left neural foraminal narrowing. No spinal canal stenosis.   IMPRESSION: 1. Unchanged postoperative appearance from prior L2-3 and L3-4 laminectomies. 2. At L4-5, moderate bilateral subarticular zone narrowing (right greater than left) with  mass effect on the traversing bilateral L5 nerve roots, and moderate right neural foraminal narrowing. 3. Multilevel neural foraminal narrowing, worst at L3-4 with severe left and moderate right narrowing.   Electronically signed by: Ryan Chess MD 04/05/2024 09:31 AM EST RP Workstation: [id number]   She reports that she has never smoked. She has never used smokeless tobacco.  Recent Labs    08/04/23 0201 04/05/24 1443  HGBA1C 6.1* 6.3*    Objective:  VS:  HT:    WT:   BMI:     BP:   HR: bpm  TEMP: ( )  RESP:  Physical Exam Vitals and nursing note reviewed.  HENT:     Head: Normocephalic and atraumatic.     Right Ear: External ear normal.     Left Ear: External ear normal.     Nose: Nose normal.     Mouth/Throat:     Mouth: Mucous membranes are moist.  Eyes:     Extraocular Movements: Extraocular movements intact.  Cardiovascular:     Rate and Rhythm: Normal rate.     Pulses: Normal pulses.  Pulmonary:     Effort: Pulmonary effort is normal.  Abdominal:     General: Abdomen is flat. There is no distension.  Musculoskeletal:        General: Tenderness present.     Cervical back: Normal range of motion.     Comments: Patient is slow to rise from seated position to standing. Good lumbar range of motion. No pain noted with facet loading. 5/5 strength noted with bilateral hip flexion, knee flexion/extension, ankle dorsiflexion/plantarflexion and EHL. No clonus noted bilaterally. No pain upon  palpation of greater trochanters. No pain with internal/external rotation of bilateral hips. Sensation intact bilaterally. Negative slump test bilaterally. Ambulates with cane, gait slow and unsteady.   Skin:    General: Skin is warm and dry.     Capillary Refill: Capillary refill takes less than 2 seconds.  Neurological:     Mental Status: She is alert and oriented to person, place, and time.     Gait: Gait abnormal.  Psychiatric:        Mood and Affect: Mood normal.         Behavior: Behavior normal.     Ortho Exam  Imaging: No results found.  Past Medical/Family/Surgical/Social History: Medications & Allergies reviewed per EMR, new medications updated. Patient Active Problem List   Diagnosis Date Noted   Imaging of gastrointestinal tract abnormal 04/15/2024   Infectious diarrhea 04/15/2024   Intractable nausea and vomiting 04/14/2024   C. difficile colitis 04/08/2024   Pyelonephritis 04/05/2024   History of CVA (cerebrovascular accident) 04/05/2024   Hypokalemia 03/24/2024   Hypomagnesemia 03/23/2024   Diarrhea 03/22/2024   Obesity (BMI 30.0-34.9) 09/29/2022   CAD (coronary artery disease) 08/17/2022   Spinal stenosis of lumbar region with neurogenic claudication 04/26/2021   Primary osteoarthritis of left foot 10/21/2020   Plantar fasciitis 10/21/2020   Essential hypertension 12/19/2019   Diabetes mellitus due to underlying condition with unspecified complications (HCC) 12/19/2019   Glaucoma, both eyes    GERD (gastroesophageal reflux disease)    Fibromyalgia    Depression, major, recurrent, in complete remission    Bulging lumbar disc    Arthritis    AAA (abdominal aortic aneurysm)    Primary osteoarthritis of left knee 07/18/2019   Mixed hyperlipidemia 04/18/2017   Solitary pulmonary nodule 03/18/2015   Cataract 01/01/2014   Past Medical History:  Diagnosis Date   AAA (abdominal aortic aneurysm)    ACS (acute coronary syndrome) (HCC) 08/01/2022   Arthritis    knees (03/21/2017)   Borderline diabetes    Bulging lumbar disc    CAD (coronary artery disease) 08/17/2022   Depression    Diabetes mellitus due to underlying condition with unspecified complications (HCC) 12/19/2019   Disequilibrium    Dizziness    Essential hypertension 12/19/2019   Fall    Fibromyalgia    GERD (gastroesophageal reflux disease)    Glaucoma, both eyes    Hemoptysis 03/18/2015   Hyperlipidemia    don't have it but I take RX (03/21/2017)    Hypertension    Mixed dyslipidemia 12/19/2019   Mixed hyperlipidemia 04/18/2017   Nuclear cataract of both eyes 01/01/2014   Obesity (BMI 30.0-34.9) 09/29/2022   Plantar fasciitis 10/21/2020   Posterior tibial tendon dysfunction (PTTD) of right lower extremity 11/16/2021   Pre-diabetes    pt states she is not diabetic. Last A1C was normal. Pt is on no medications for DM   Preoperative cardiovascular examination 12/19/2019   Primary osteoarthritis of left foot 10/21/2020   Primary osteoarthritis of left knee 07/18/2019   Solitary pulmonary nodule 03/18/2015   03/2015 6mm RLL nodule    Spinal stenosis of lumbar region with neurogenic claudication 04/26/2021   Spinal stenosis, lumbar region with neurogenic claudication 04/26/2021   Central stenosis, L2-3 and L3-4 with moderate foramenal narrowing L4-5   Status post AAA (abdominal aortic aneurysm) repair 12/19/2019   Status post total left knee replacement 03/27/2020   STEMI (ST elevation myocardial infarction) (HCC) 08/01/2022   STEMI involving right coronary artery (HCC) 07/29/2022   Syncope  03/21/2017   Syncope and collapse    TIA (transient ischemic attack) 08/03/2023   Family History  Problem Relation Age of Onset   Hypertension Father    Stomach cancer Father    Glaucoma Mother    Diabetes Mother    Diabetes Sister    Diabetes Brother    Lupus Child    Diabetes Brother    Lung cancer Brother    Colon cancer Brother    Past Surgical History:  Procedure Laterality Date   ABDOMINAL AORTIC ANEURYSM REPAIR  1966   APPENDECTOMY  1966   CARDIAC CATHETERIZATION     20+ years ago, no abnormalities found   COLONOSCOPY  11/02/2016   Dr Towana. A few diverticuli were seen in the sigmoid colon. Otherwise normal colonoscopy to cecum   CORONARY/GRAFT ACUTE MI REVASCULARIZATION N/A 07/29/2022   Procedure: Coronary/Graft Acute MI Revascularization;  Surgeon: Burnard Debby LABOR, MD;  Location: [facility] INVASIVE CV LAB;  Service: Cardiovascular;   Laterality: N/A;   ESOPHAGOGASTRODUODENOSCOPY N/A 04/16/2024   Procedure: EGD (ESOPHAGOGASTRODUODENOSCOPY);  Surgeon: Stacia Glendia BRAVO, MD;  Location: [facility] ENDOSCOPY;  Service: Gastroenterology;  Laterality: N/A;   LEFT HEART CATH AND CORONARY ANGIOGRAPHY N/A 07/29/2022   Procedure: LEFT HEART CATH AND CORONARY ANGIOGRAPHY;  Surgeon: Burnard Debby LABOR, MD;  Location: [facility] INVASIVE CV LAB;  Service: Cardiovascular;  Laterality: N/A;   LUMBAR LAMINECTOMY/DECOMPRESSION MICRODISCECTOMY N/A 04/26/2021   Procedure: CENTRAL LAMINECTOMIES LUMBAR TWO-THREE,  LUMBAR THREE-FOUR WITH BILATERAL FORAMINOTOMIES;  Surgeon: Lucilla Lynwood BRAVO, MD;  Location: [facility] OR;  Service: Orthopedics;  Laterality: N/A;   TOTAL KNEE ARTHROPLASTY Left 03/27/2020   Procedure: LEFT TOTAL KNEE ARTHROPLASTY;  Surgeon: Jerri Kay HERO, MD;  Location: [facility] OR;  Service: Orthopedics;  Laterality: Left;   VAGINAL HYSTERECTOMY     partial   VIDEO BRONCHOSCOPY Bilateral 03/19/2015   Procedure: VIDEO BRONCHOSCOPY WITHOUT FLUORO;  Surgeon: Harden Jude GAILS, MD;  Location: [facility] ENDOSCOPY;  Service: Cardiopulmonary;  Laterality: Bilateral;   VISCERAL ANGIOGRAPHY N/A 04/17/2024   Procedure: VISCERAL ANGIOGRAPHY;  Surgeon: Lanis Fonda BRAVO, MD;  Location: [facility] INVASIVE CV LAB;  Service: Cardiovascular;  Laterality: N/A;   Social History   Occupational History   Occupation: retired    Comment: designer, fashion/clothing; dietician   Occupation: retired  Tobacco Use   Smoking status: Never   Smokeless tobacco: Never  Vaping Use   Vaping status: Never Used  Substance and Sexual Activity   Alcohol use: No   Drug use: No   Sexual activity: Not on file    "

## 2024-05-02 ENCOUNTER — Other Ambulatory Visit: Payer: Self-pay | Admitting: Vascular Surgery

## 2024-05-02 ENCOUNTER — Encounter: Payer: Self-pay | Admitting: Physical Medicine and Rehabilitation

## 2024-05-02 DIAGNOSIS — I714 Abdominal aortic aneurysm, without rupture, unspecified: Secondary | ICD-10-CM

## 2024-05-07 NOTE — Progress Notes (Unsigned)
 "    05/07/2024 Cassandra Brooks [id number] 01-30-1941  Referring provider: Erick Greig LABOR, NP Primary GI doctor: Dr. Charlanne  ASSESSMENT AND PLAN:  C. difficile colitis previous history of constipation Multiple KUB showing large 2 moderate stool burden  Chronic GERD with esophagitis CT scan small hiatal hernia with distal wall thickening 04/16/2024 EGD [place] hiatal hernia LA grade C reflux esophagitis pale discolored mucosa gastric body and antrum suggestive of ischemia negative fungal, metaplasia, H. pylori  Chronic macrocytic anemia  History of CVA/CAD On Plavix   Family history of colon cancer (father and brother) 2018 unremarkable colonoscopy   Patient Care Team: Erick Greig LABOR, NP as PCP - General (Internal Medicine) Revankar, Jennifer SAUNDERS, MD as PCP - Cardiology (Cardiology)  HISTORY OF PRESENT ILLNESS: 84 y.o. female with a past medical history listed below presents for evaluation of ***.   Patient had recent hospitalization 1/4 through 1/8 for C. difficile with associated nausea vomiting abdominal pain and poor  *** Discussed the use of AI scribe software for clinical note transcription with the patient, who gave verbal consent to proceed.  History of Present Illness            She  reports that she has never smoked. She has never used smokeless tobacco. She reports that she does not drink alcohol and does not use drugs.  RELEVANT GI HISTORY, IMAGING AND LABS: Results          CBC    Component Value Date/Time   WBC 5.2 04/18/2024 0526   RBC 3.00 (L) 04/18/2024 0526   HGB 10.7 (L) 04/18/2024 0526   HCT 32.4 (L) 04/18/2024 0526   HCT 42.3 03/22/2017 0336   PLT 149 (L) 04/18/2024 0526   MCV 108.0 (H) 04/18/2024 0526   MCH 35.7 (H) 04/18/2024 0526   MCHC 33.0 04/18/2024 0526   RDW 14.0 04/18/2024 0526   LYMPHSABS 1.4 04/18/2024 0526   MONOABS 0.5 04/18/2024 0526   EOSABS 0.2 04/18/2024 0526   BASOSABS 0.0 04/18/2024 0526   Recent Labs    04/05/24 0750  04/05/24 1443 04/06/24 0248 04/08/24 0350 04/09/24 0053 04/10/24 1401 04/14/24 0951 04/15/24 0433 04/17/24 0313 04/18/24 0526  HGB 13.3 12.2 11.3* 9.6* 10.8* 11.8* 14.2 13.0 10.9* 10.7*    CMP     Component Value Date/Time   NA 141 04/18/2024 0526   NA 141 11/23/2022 0854   K 4.0 04/18/2024 0526   CL 111 04/18/2024 0526   CO2 21 (L) 04/18/2024 0526   GLUCOSE 105 (H) 04/18/2024 0526   BUN 21 04/18/2024 0526   BUN 28 (H) 11/23/2022 0854   CREATININE 1.05 (H) 04/18/2024 0526   CALCIUM  7.9 (L) 04/18/2024 0526   PROT 6.7 04/15/2024 0433   PROT 6.7 11/23/2022 0854   ALBUMIN  3.4 (L) 04/15/2024 0433   ALBUMIN  4.0 11/23/2022 0854   AST 22 04/15/2024 0433   ALT 15 04/15/2024 0433   ALKPHOS 79 04/15/2024 0433   BILITOT 0.5 04/15/2024 0433   BILITOT 0.4 11/23/2022 0854   GFRNONAA 52 (L) 04/18/2024 0526   GFRAA 54 (L) 08/23/2019 1747      Latest Ref Rng & Units 04/15/2024    4:33 AM 04/14/2024    9:51 AM 04/10/2024    2:01 PM  Hepatic Function  Total Protein 6.5 - 8.1 g/dL 6.7  7.6  6.1   Albumin  3.5 - 5.0 g/dL 3.4  3.8  3.3   AST 15 - 41 U/L 22  21  23    ALT  0 - 44 U/L 15  18  19    Alk Phosphatase 38 - 126 U/L 79  93  78   Total Bilirubin 0.0 - 1.2 mg/dL 0.5  0.4  0.4       Current Medications:   Current Outpatient Medications (Cardiovascular):    amLODipine  (NORVASC ) 10 MG tablet, Take 10 mg by mouth daily.   atorvastatin  (LIPITOR ) 80 MG tablet, Take 1 tablet (80 mg total) by mouth daily.   metoprolol  tartrate (LOPRESSOR ) 50 MG tablet, Take 1 tablet (50 mg total) by mouth 2 (two) times daily.   nitroGLYCERIN  (NITROSTAT ) 0.4 MG SL tablet, Place 0.4 mg under the tongue every 5 (five) minutes as needed for chest pain.   valsartan  (DIOVAN ) 160 MG tablet, Take 1 tablet (160 mg total) by mouth at bedtime.  Current Outpatient Medications (Respiratory):    azelastine (ASTELIN) 0.1 % nasal spray, Place 1-2 sprays into both nostrils 2 (two) times daily as needed for rhinitis or  allergies.   fluticasone  (FLONASE ) 50 MCG/ACT nasal spray, Place 2 sprays into both nostrils daily.  Current Outpatient Medications (Analgesics):    acetaminophen  (TYLENOL ) 325 MG tablet, Take 2 tablets (650 mg total) by mouth every 4 (four) hours as needed for headache or mild pain (pain score 1-3).   aspirin  EC 81 MG tablet, Take 1 tablet (81 mg total) by mouth at bedtime.  Current Outpatient Medications (Hematological):    clopidogrel  (PLAVIX ) 75 MG tablet, Take 1 tablet (75 mg total) by mouth daily.  Current Outpatient Medications (Other):    diclofenac  (FLECTOR ) 1.3 % PTCH, Place 1 patch onto the skin 2 (two) times daily.   diclofenac  Sodium (VOLTAREN ) 1 % GEL, Apply 2 g topically 4 (four) times daily.   dorzolamide -timolol  (COSOPT ) 22.3-6.8 MG/ML ophthalmic solution, Place 1 drop into both eyes 2 (two) times daily.   famotidine  (PEPCID ) 40 MG tablet, Take 1 tablet (40 mg total) by mouth daily.   Multiple Vitamins-Minerals (MULTIVITAMIN WOMEN 50+) TABS, Take 1 tablet by mouth daily.   ondansetron  (ZOFRAN -ODT) 4 MG disintegrating tablet, Take 1 tablet (4 mg total) by mouth every 4 (four) hours as needed.   Probiotic Product (PROBIOTIC PO), Take 1 capsule by mouth daily.   ROCKLATAN  0.02-0.005 % SOLN, Apply 1 drop to eye at bedtime.   venlafaxine  XR (EFFEXOR -XR) 150 MG 24 hr capsule, Take 1 capsule (150 mg total) by mouth daily with breakfast.  Medical History:  Past Medical History:  Diagnosis Date   AAA (abdominal aortic aneurysm)    ACS (acute coronary syndrome) (HCC) 08/01/2022   Arthritis    knees (03/21/2017)   Borderline diabetes    Bulging lumbar disc    CAD (coronary artery disease) 08/17/2022   Depression    Diabetes mellitus due to underlying condition with unspecified complications (HCC) 12/19/2019   Disequilibrium    Dizziness    Essential hypertension 12/19/2019   Fall    Fibromyalgia    GERD (gastroesophageal reflux disease)    Glaucoma, both eyes     Hemoptysis 03/18/2015   Hyperlipidemia    don't have it but I take RX (03/21/2017)   Hypertension    Mixed dyslipidemia 12/19/2019   Mixed hyperlipidemia 04/18/2017   Nuclear cataract of both eyes 01/01/2014   Obesity (BMI 30.0-34.9) 09/29/2022   Plantar fasciitis 10/21/2020   Posterior tibial tendon dysfunction (PTTD) of right lower extremity 11/16/2021   Pre-diabetes    pt states she is not diabetic. Last A1C was normal. Pt is on no  medications for DM   Preoperative cardiovascular examination 12/19/2019   Primary osteoarthritis of left foot 10/21/2020   Primary osteoarthritis of left knee 07/18/2019   Solitary pulmonary nodule 03/18/2015   03/2015 6mm RLL nodule    Spinal stenosis of lumbar region with neurogenic claudication 04/26/2021   Spinal stenosis, lumbar region with neurogenic claudication 04/26/2021   Central stenosis, L2-3 and L3-4 with moderate foramenal narrowing L4-5   Status post AAA (abdominal aortic aneurysm) repair 12/19/2019   Status post total left knee replacement 03/27/2020   STEMI (ST elevation myocardial infarction) (HCC) 08/01/2022   STEMI involving right coronary artery (HCC) 07/29/2022   Syncope 03/21/2017   Syncope and collapse    TIA (transient ischemic attack) 08/03/2023   Allergies: Allergies[1]   Surgical History:  She  has a past surgical history that includes Video bronchoscopy (Bilateral, 03/19/2015); Abdominal aortic aneurysm repair (1966); Appendectomy (1966); Vaginal hysterectomy; Colonoscopy (11/02/2016); Total knee arthroplasty (Left, 03/27/2020); Cardiac catheterization; Lumbar laminectomy/decompression microdiscectomy (N/A, 04/26/2021); Coronary/Graft Acute MI Revascularization (N/A, 07/29/2022); LEFT HEART CATH AND CORONARY ANGIOGRAPHY (N/A, 07/29/2022); Esophagogastroduodenoscopy (N/A, 04/16/2024); and VISCERAL ANGIOGRAPHY (N/A, 04/17/2024). Family History:  Her family history includes Colon cancer in her brother; Diabetes in her brother,  brother, mother, and sister; Glaucoma in her mother; Hypertension in her father; Lung cancer in her brother; Lupus in her child; Stomach cancer in her father.  REVIEW OF SYSTEMS  : All other systems reviewed and negative except where noted in the History of Present Illness.  PHYSICAL EXAM: There were no vitals taken for this visit. Physical Exam          Alan JONELLE Coombs, PA-C 8:06 PM      [1]  Allergies Allergen Reactions   Zestril  [Lisinopril ] Cough   "

## 2024-05-08 ENCOUNTER — Telehealth: Payer: Self-pay | Admitting: Physician Assistant

## 2024-05-08 ENCOUNTER — Ambulatory Visit: Admitting: Physician Assistant

## 2024-05-08 NOTE — Telephone Encounter (Signed)
 Inbound call from patients husband stating he would like to speak to nurse in regards to hospital procedure wife previously had on 04/16/24 Requesting a call back  Please advise  Thank you

## 2024-05-08 NOTE — Telephone Encounter (Signed)
 Spoke with Mr. Steedley.  He just wanted to give his heartfelt thanks to you, Dr Milford, Dr Charlanne, and Dr Lanis for the care his wife received in hospital.  Endoscopy and vascular procedure have been life-changing for his wife.  He wanted to make sure you were told.

## 2024-05-10 ENCOUNTER — Telehealth: Payer: Self-pay | Admitting: Cardiology

## 2024-05-10 NOTE — Telephone Encounter (Signed)
" °*  STAT* If patient is at the pharmacy, call can be transferred to refill team.   1. Which medications need to be refilled? (please list name of each medication and dose if known)   metoprolol  tartrate (LOPRESSOR ) 50 MG tablet Take 1 tablet (50 mg total) by mouth 2 (two) times daily.     4. Which pharmacy/location (including street and city if local pharmacy) is medication to be sent to?  [id number] [place], [place] - [address] AT [facility] Phone: [phone]  Fax: [phone]       5. Do they need a 30 day or 90 day supply? 90   "

## 2024-05-13 MED ORDER — METOPROLOL TARTRATE 50 MG PO TABS: 50.0000 mg | ORAL_TABLET | Freq: Two times a day (BID) | ORAL | 0 refills | Status: AC

## 2024-05-13 NOTE — Telephone Encounter (Signed)
 Refill sent

## 2024-05-14 ENCOUNTER — Ambulatory Visit: Admitting: Physician Assistant

## 2024-05-17 ENCOUNTER — Telehealth: Payer: Self-pay

## 2024-05-17 NOTE — Telephone Encounter (Signed)
"  ° °  Pre-operative Risk Assessment    Patient Name: Cassandra Brooks  DOB: [birth year] MRN: [id number]   Date of last office visit: 10/26/23 Date of next office visit: Not scheduled   Request for Surgical Clearance    Procedure:  Epidural steroid injection  Date of Surgery:  Clearance TBD                                Surgeon:  Not provided Surgeon's Group or Practice Name:  [facility] Phone number:  [phone] Fax number:  [phone]   Type of Clearance Requested:   - Medical  - Pharmacy:  Hold Clopidogrel  (Plavix ) x7 days prior   Type of Anesthesia:  Not Indicated   Additional requests/questions:    Bonney Ival LOISE Gerome   05/17/2024, 5:15 PM   "

## 2024-05-23 ENCOUNTER — Ambulatory Visit (HOSPITAL_COMMUNITY)

## 2024-05-30 ENCOUNTER — Ambulatory Visit: Admitting: Gastroenterology

## 2024-10-14 ENCOUNTER — Ambulatory Visit: Admitting: Neurology
